# Patient Record
Sex: Male | Born: 1955
Health system: Southern US, Community
[De-identification: ages and names within clinical notes are randomized; demographics above are authoritative.]

## PROBLEM LIST (undated history)

## (undated) DIAGNOSIS — K644 Residual hemorrhoidal skin tags: Secondary | ICD-10-CM

## (undated) DIAGNOSIS — M1990 Unspecified osteoarthritis, unspecified site: Secondary | ICD-10-CM

## (undated) DIAGNOSIS — R5381 Other malaise: Secondary | ICD-10-CM

## (undated) DIAGNOSIS — G4700 Insomnia, unspecified: Secondary | ICD-10-CM

## (undated) DIAGNOSIS — R0989 Other specified symptoms and signs involving the circulatory and respiratory systems: Secondary | ICD-10-CM

## (undated) DIAGNOSIS — R7989 Other specified abnormal findings of blood chemistry: Secondary | ICD-10-CM

## (undated) DIAGNOSIS — R0600 Dyspnea, unspecified: Secondary | ICD-10-CM

## (undated) DIAGNOSIS — J45909 Unspecified asthma, uncomplicated: Secondary | ICD-10-CM

## (undated) DIAGNOSIS — I714 Abdominal aortic aneurysm, without rupture, unspecified: Secondary | ICD-10-CM

## (undated) DIAGNOSIS — J4489 Other specified chronic obstructive pulmonary disease: Secondary | ICD-10-CM

## (undated) DIAGNOSIS — K219 Gastro-esophageal reflux disease without esophagitis: Secondary | ICD-10-CM

## (undated) DIAGNOSIS — N419 Inflammatory disease of prostate, unspecified: Secondary | ICD-10-CM

## (undated) DIAGNOSIS — Z720 Tobacco use: Secondary | ICD-10-CM

## (undated) DIAGNOSIS — R0609 Other forms of dyspnea: Secondary | ICD-10-CM

## (undated) DIAGNOSIS — R21 Rash and other nonspecific skin eruption: Secondary | ICD-10-CM

## (undated) DIAGNOSIS — Z8719 Personal history of other diseases of the digestive system: Secondary | ICD-10-CM

## (undated) DIAGNOSIS — R5383 Other fatigue: Secondary | ICD-10-CM

## (undated) DIAGNOSIS — J449 Chronic obstructive pulmonary disease, unspecified: Secondary | ICD-10-CM

## (undated) DIAGNOSIS — L538 Other specified erythematous conditions: Secondary | ICD-10-CM

## (undated) DIAGNOSIS — E298 Other testicular dysfunction: Secondary | ICD-10-CM

## (undated) DIAGNOSIS — I10 Essential (primary) hypertension: Secondary | ICD-10-CM

## (undated) DIAGNOSIS — C4490 Unspecified malignant neoplasm of skin, unspecified: Secondary | ICD-10-CM

## (undated) DIAGNOSIS — E785 Hyperlipidemia, unspecified: Secondary | ICD-10-CM

## (undated) DIAGNOSIS — K649 Unspecified hemorrhoids: Secondary | ICD-10-CM

## (undated) HISTORY — PX: TONSILLECTOMY: SUR1361

## (undated) HISTORY — DX: Rash and other nonspecific skin eruption: R21

## (undated) HISTORY — PX: WRIST SURGERY: SHX841

## (undated) HISTORY — DX: Tobacco use: Z72.0

## (undated) HISTORY — PX: APPENDECTOMY: SHX54

## (undated) HISTORY — PX: TYMPANOPLASTY: SHX33

## (undated) HISTORY — DX: Chronic obstructive pulmonary disease, unspecified: J44.9

## (undated) HISTORY — DX: Dyspnea, unspecified: R06.00

## (undated) HISTORY — DX: Other specified abnormal findings of blood chemistry: R79.89

## (undated) HISTORY — DX: Other malaise: R53.81

## (undated) HISTORY — DX: Other testicular dysfunction: E29.8

## (undated) HISTORY — DX: Insomnia, unspecified: G47.00

## (undated) HISTORY — DX: Residual hemorrhoidal skin tags: K64.4

## (undated) HISTORY — PX: SKIN CANCER EXCISION: SHX779

## (undated) HISTORY — DX: Essential (primary) hypertension: I10

## (undated) HISTORY — DX: Inflammatory disease of prostate, unspecified: N41.9

## (undated) HISTORY — DX: Other specified chronic obstructive pulmonary disease: J44.89

## (undated) HISTORY — DX: Other specified erythematous conditions: L53.8

## (undated) HISTORY — DX: Hyperlipidemia, unspecified: E78.5

## (undated) HISTORY — DX: Other fatigue: R53.83

## (undated) HISTORY — DX: Other specified symptoms and signs involving the circulatory and respiratory systems: R09.89

## (undated) HISTORY — PX: TONSILLECTOMY: SHX5217

## (undated) HISTORY — DX: Other forms of dyspnea: R06.09

## (undated) HISTORY — DX: Other forms of dyspnea: R09.89

---

## 2001-04-06 ENCOUNTER — Ambulatory Visit (HOSPITAL_COMMUNITY): Admission: RE | Admit: 2001-04-06 | Discharge: 2001-04-06 | Payer: Self-pay | Admitting: Pulmonary Disease

## 2003-02-02 ENCOUNTER — Ambulatory Visit (HOSPITAL_COMMUNITY): Admission: RE | Admit: 2003-02-02 | Discharge: 2003-02-02 | Payer: Self-pay | Admitting: Pulmonary Disease

## 2003-07-09 ENCOUNTER — Encounter: Payer: Self-pay | Admitting: Emergency Medicine

## 2003-07-09 ENCOUNTER — Emergency Department (HOSPITAL_COMMUNITY): Admission: EM | Admit: 2003-07-09 | Discharge: 2003-07-09 | Payer: Self-pay | Admitting: Emergency Medicine

## 2003-07-18 ENCOUNTER — Ambulatory Visit (HOSPITAL_COMMUNITY): Admission: RE | Admit: 2003-07-18 | Discharge: 2003-07-18 | Payer: Self-pay | Admitting: Pulmonary Disease

## 2005-01-13 ENCOUNTER — Ambulatory Visit (HOSPITAL_COMMUNITY): Admission: RE | Admit: 2005-01-13 | Discharge: 2005-01-13 | Payer: Self-pay | Admitting: Pulmonary Disease

## 2005-01-13 IMAGING — CR DG CHEST 2V
2 series · 2 of 2 positions shown · non-contrast
Comparison: none

CLINICAL DATA: Short of breath, cough, and congestion. 
 TWO VIEW CHEST: 
 PA and lateral views of the chest are made and show diffuse peribronchial thickening but no evidence of acute infiltrate or consolidation.  The heart appears normal as does the mediastinum.  The bony thorax is normal.

[view not recorded (1 of 2)]
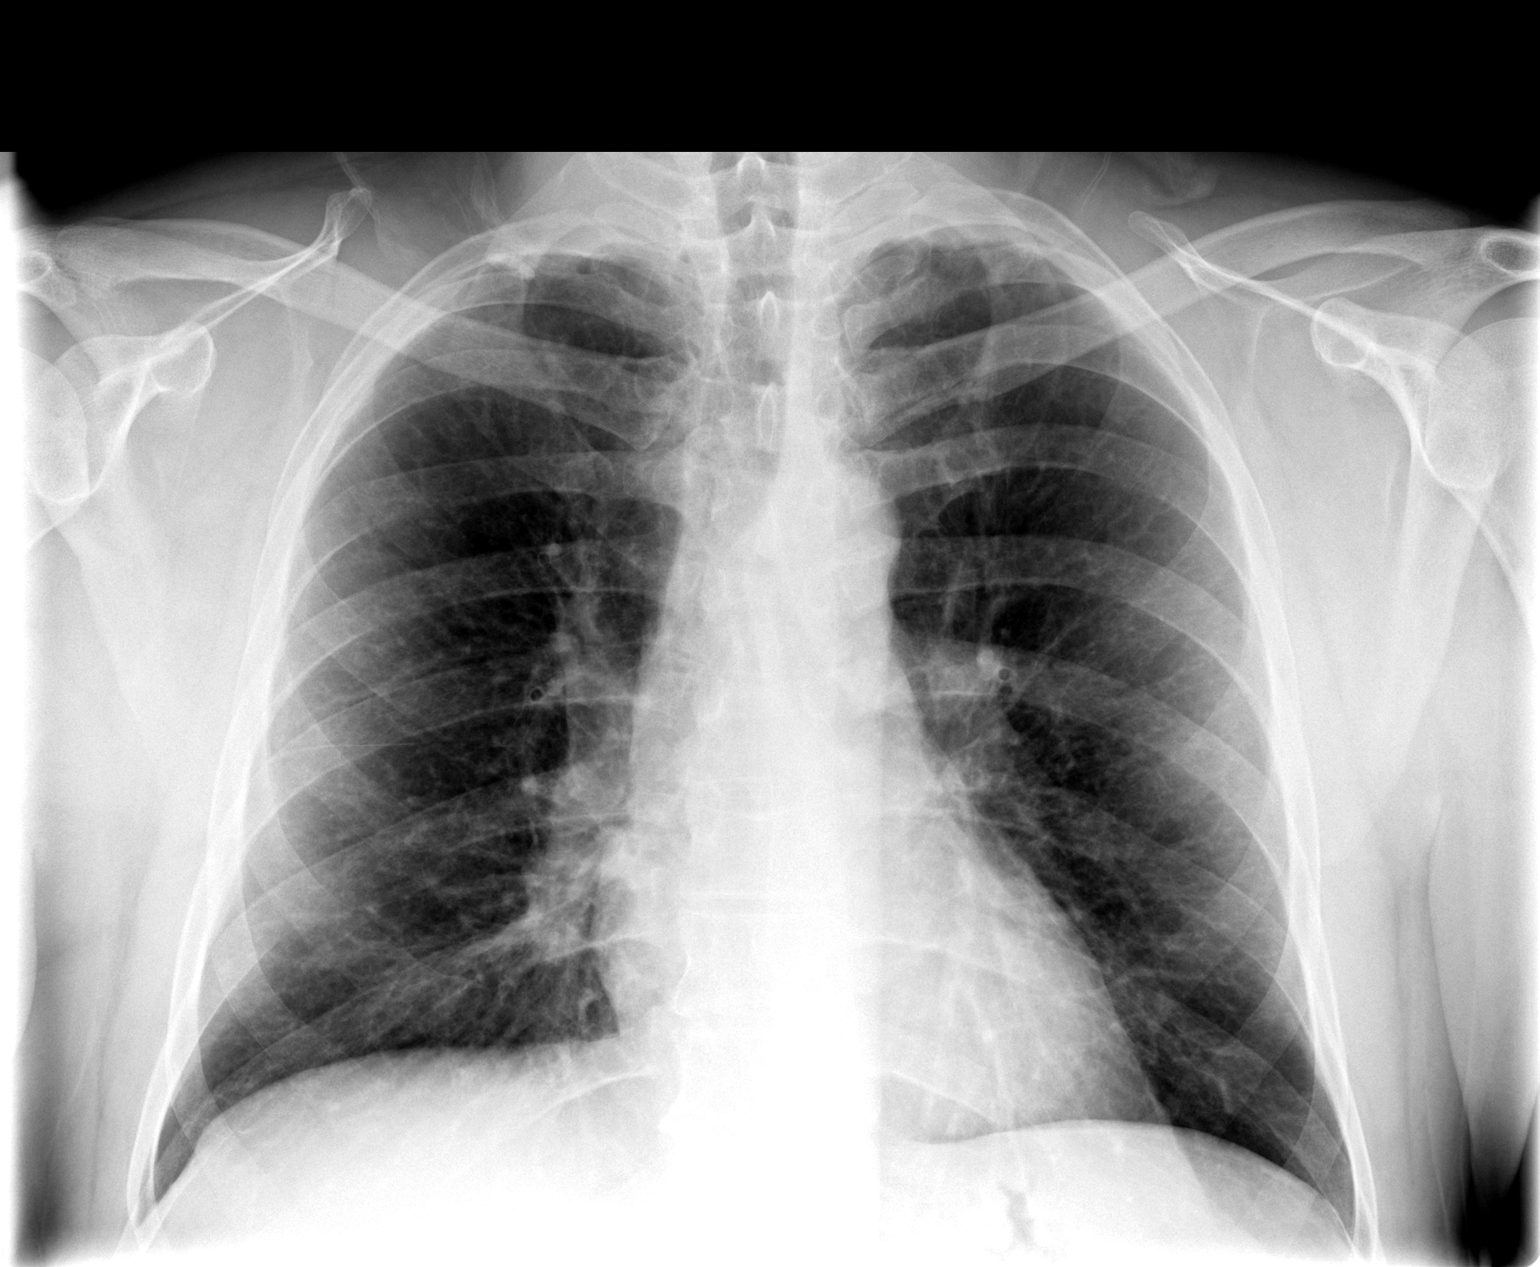

[view not recorded (2 of 2)]
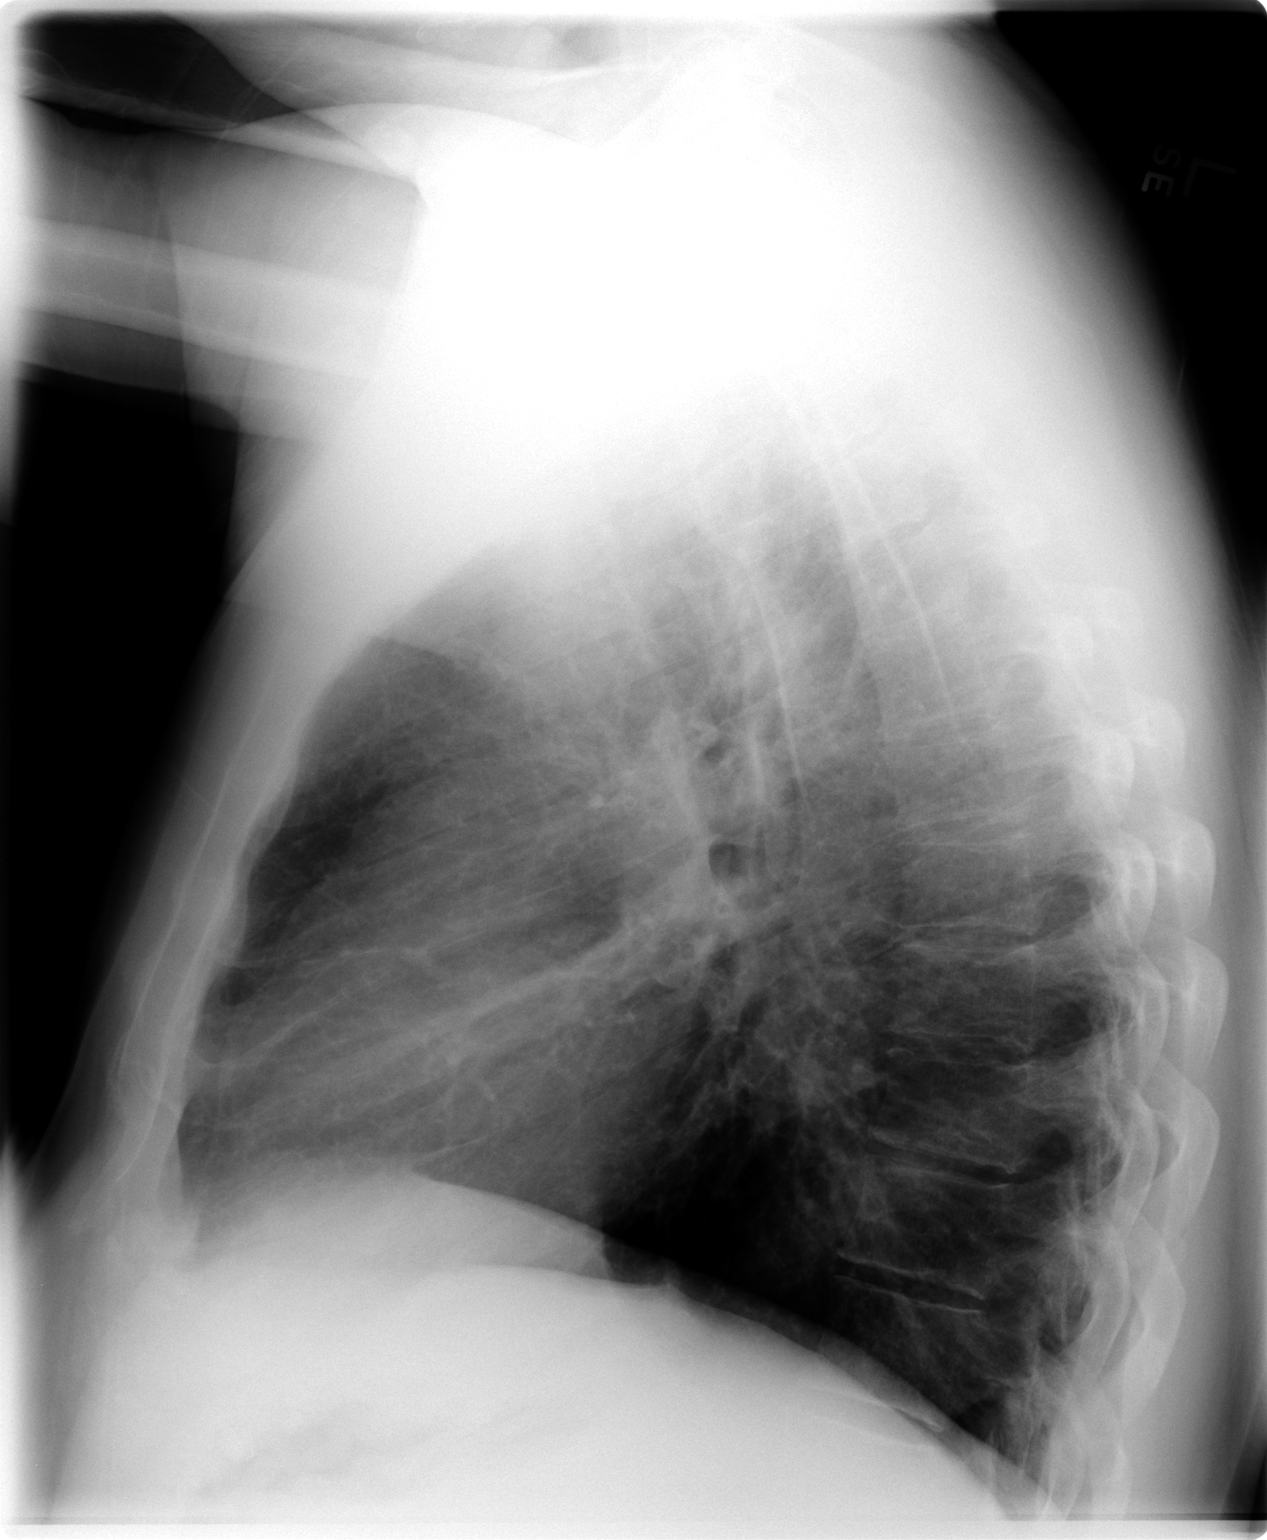

[2 of 2 positions shown; findings below may reference images not displayed]

IMPRESSION: Mild diffuse peribronchial thickening compatible with bronchitis.  No consolidation or edema is seen.

## 2005-09-25 ENCOUNTER — Ambulatory Visit (HOSPITAL_COMMUNITY): Admission: RE | Admit: 2005-09-25 | Discharge: 2005-09-25 | Payer: Self-pay | Admitting: Pulmonary Disease

## 2005-09-25 IMAGING — CR DG CHEST 2V
2 series · 2 of 2 positions shown · non-contrast
Comparison: none

CLINICAL DATA: Shortness of breath.  Bilateral leg numbness. Patient is a smoker.  
 CHEST - 2 VIEWS:

[view not recorded (1 of 2)]
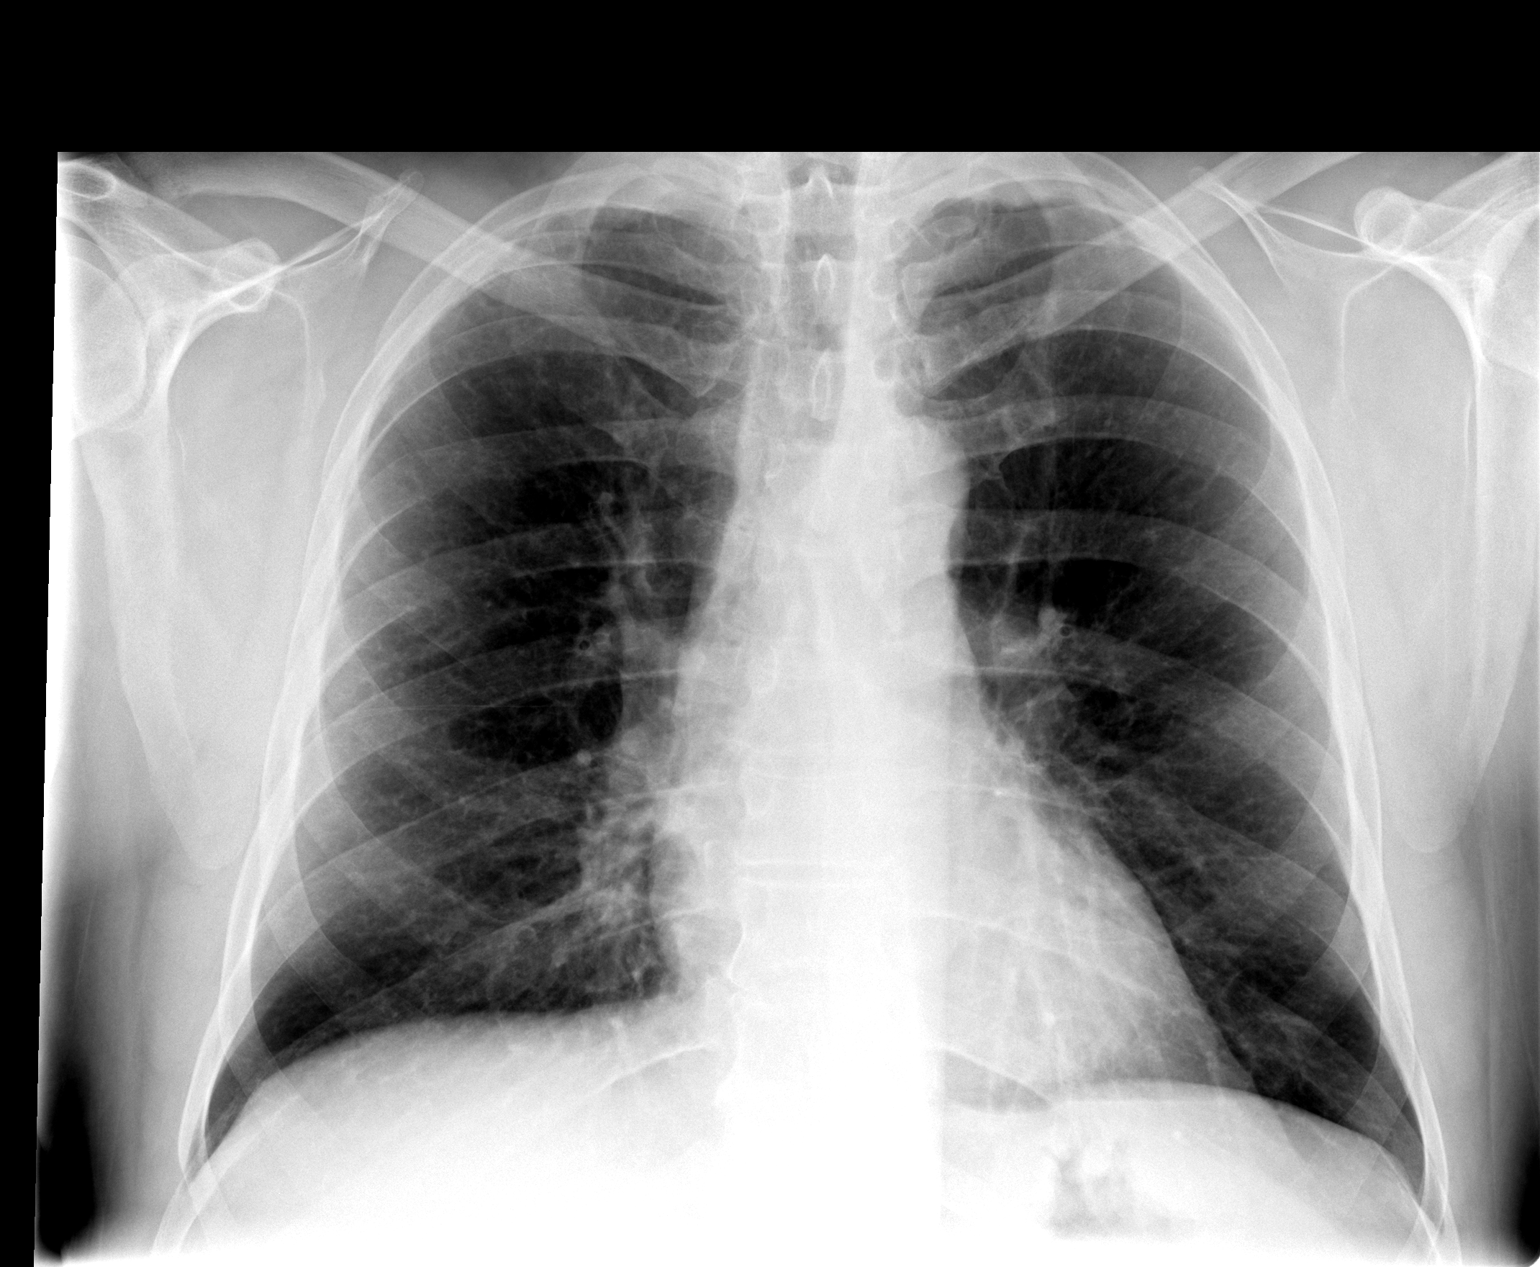

[view not recorded (2 of 2)]
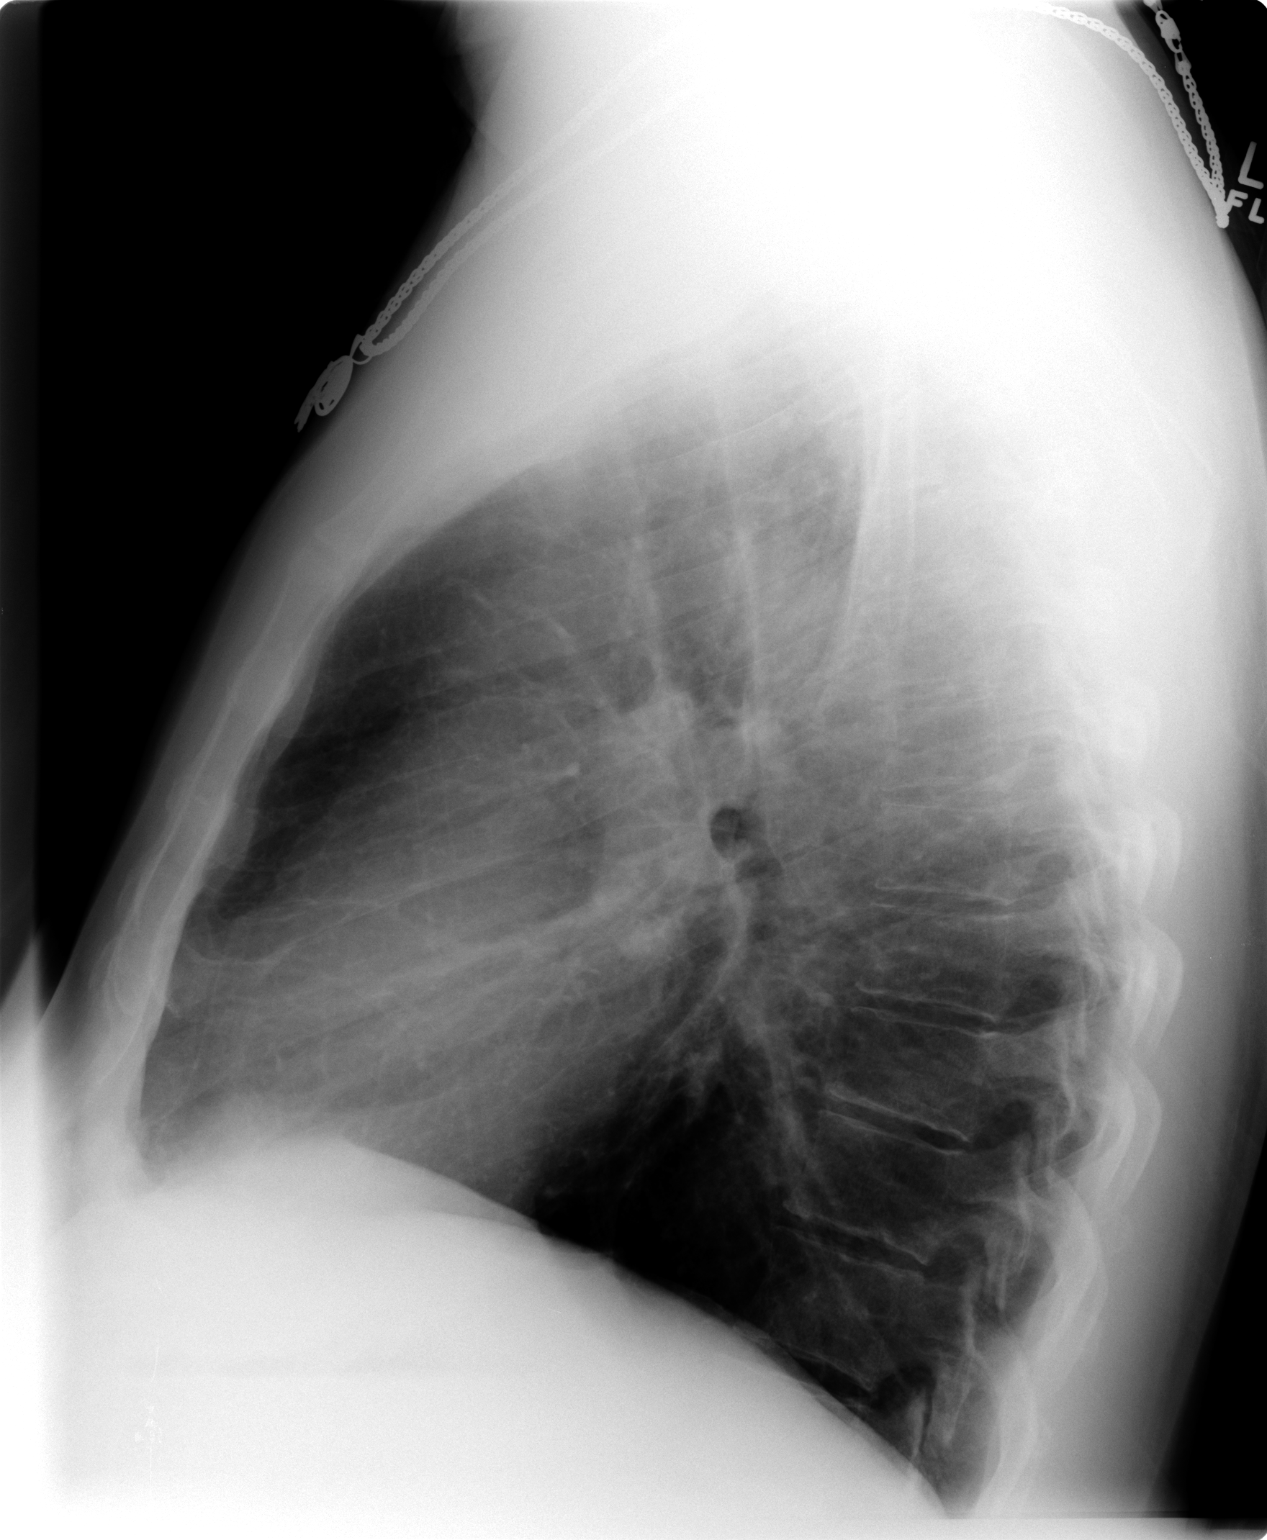

[2 of 2 positions shown; findings below may reference images not displayed]

FINDINGS: PA and lateral views of the chest are made and are compared to previous studies of [DATE] and show mild stable diffuse peribronchial thickening but no evidence of active infiltrate, consolidation, pleural effusion or edema.  The heart is normal.  The bony thorax appears normal.
IMPRESSION: Mild stable diffuse peribronchial thickening.  No evidence of active cardiopulmonary disease.

## 2005-09-25 IMAGING — CR DG LUMBAR SPINE COMPLETE 4+V
5 series · 5 of 5 positions shown · non-contrast
Comparison: none

CLINICAL DATA: Bilateral leg numbness.  Left side back pain.  
 LUMBAR SACRAL SPINE ? 5 VIEWS:

[view not recorded (1 of 5)]
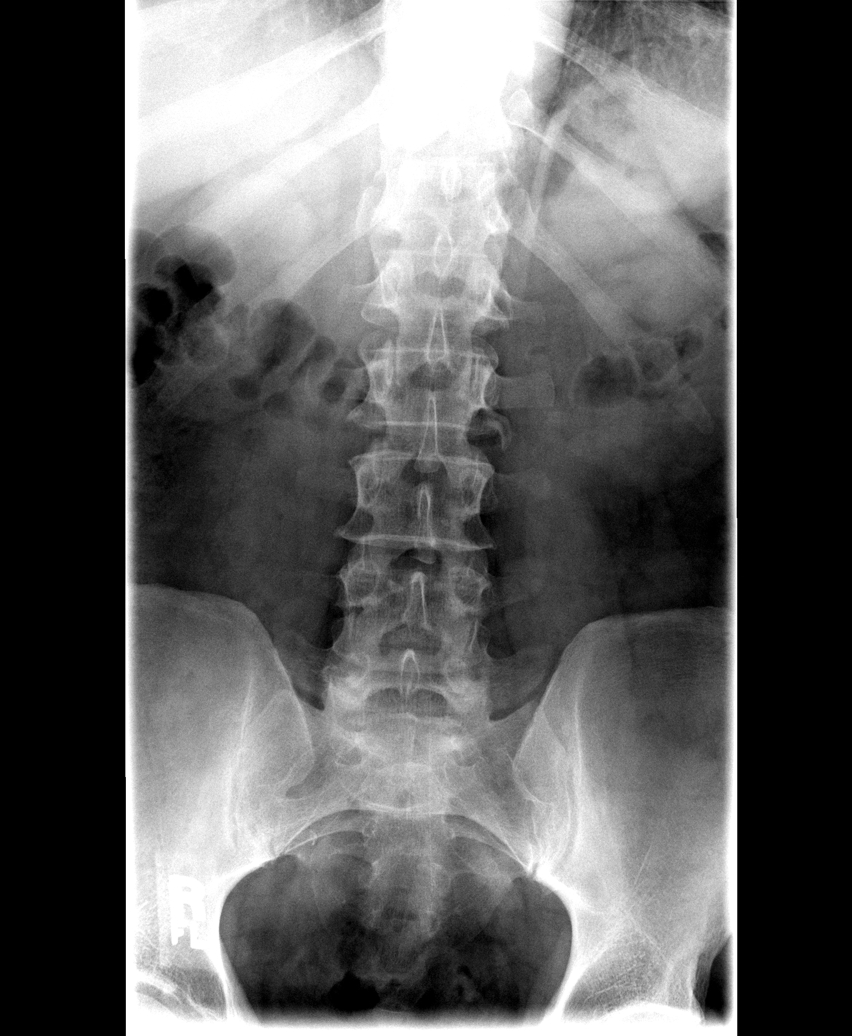

[view not recorded (2 of 5)]
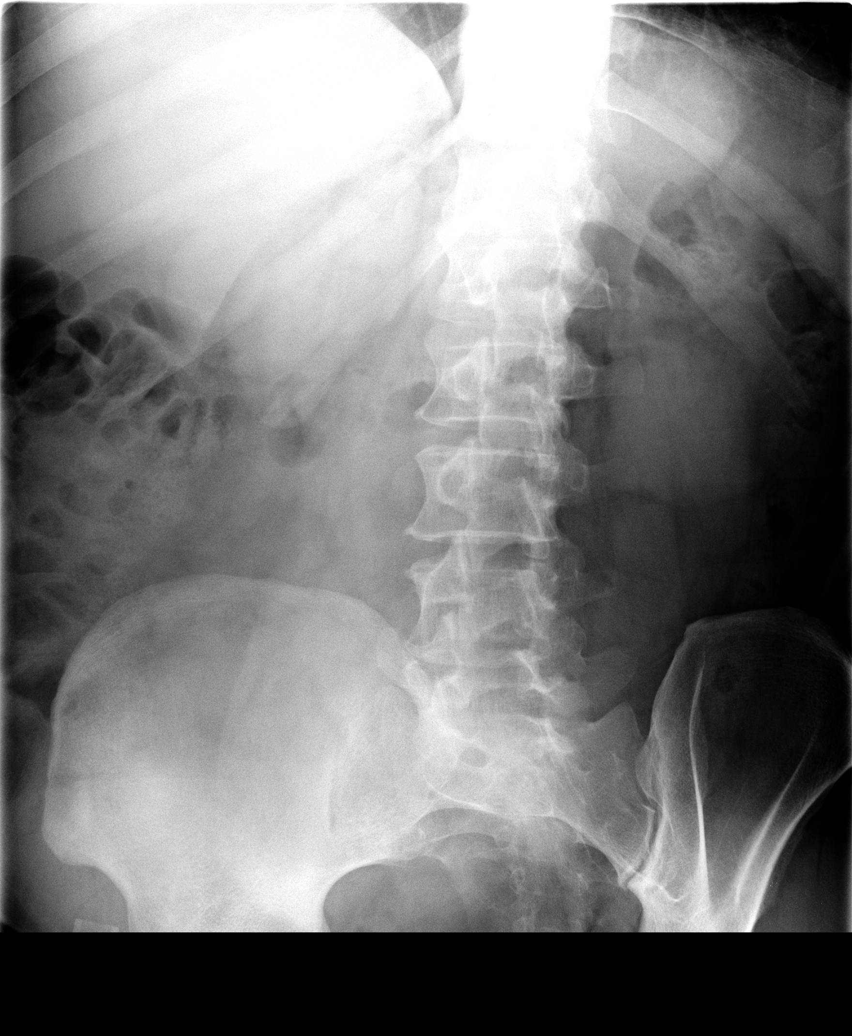

[view not recorded (3 of 5)]
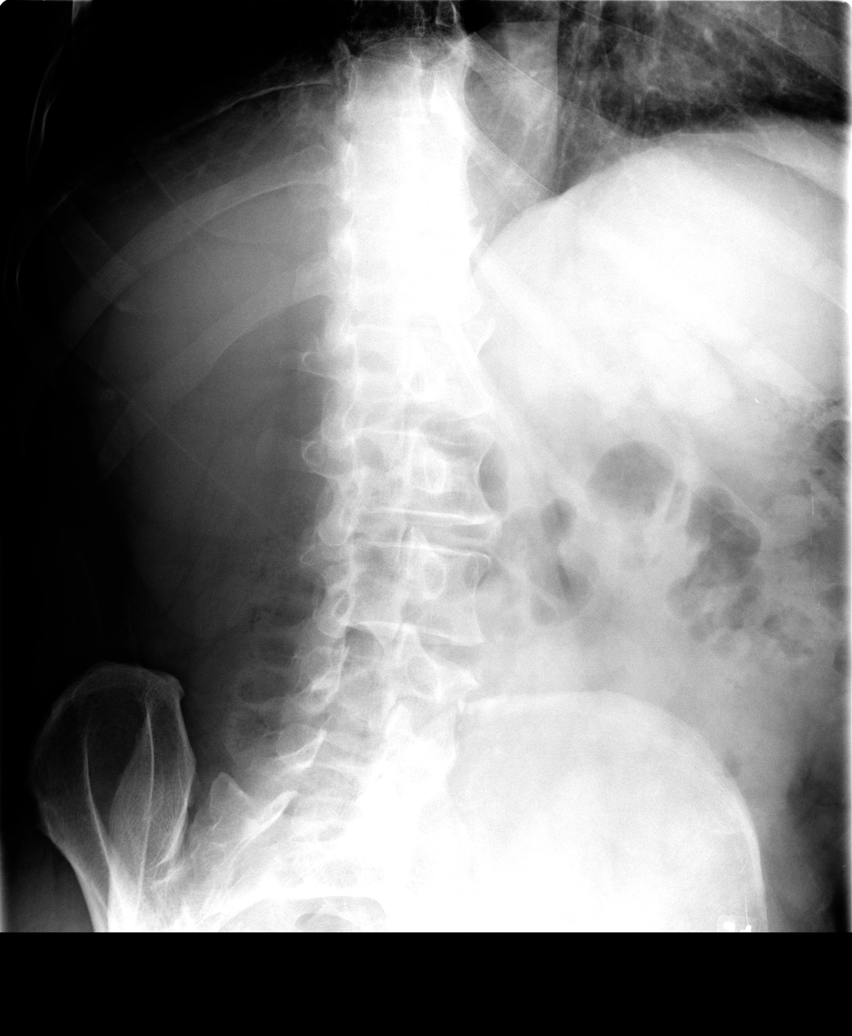

[view not recorded (4 of 5)]
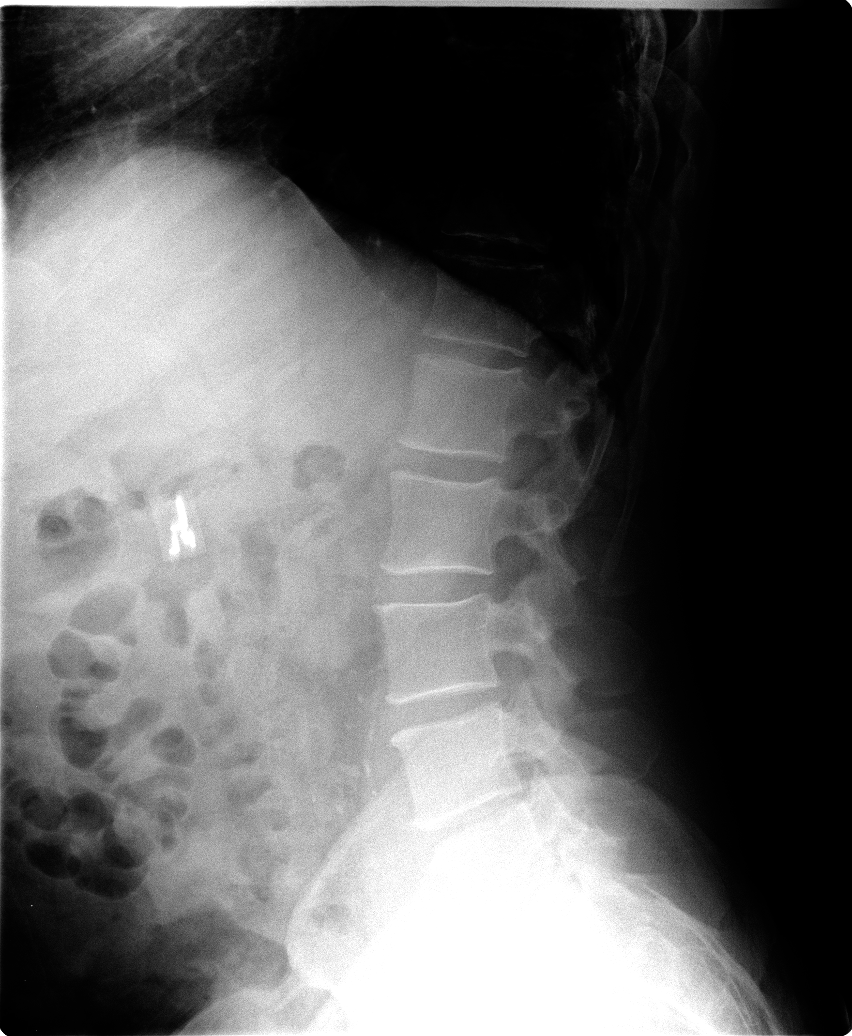

[view not recorded (5 of 5)]
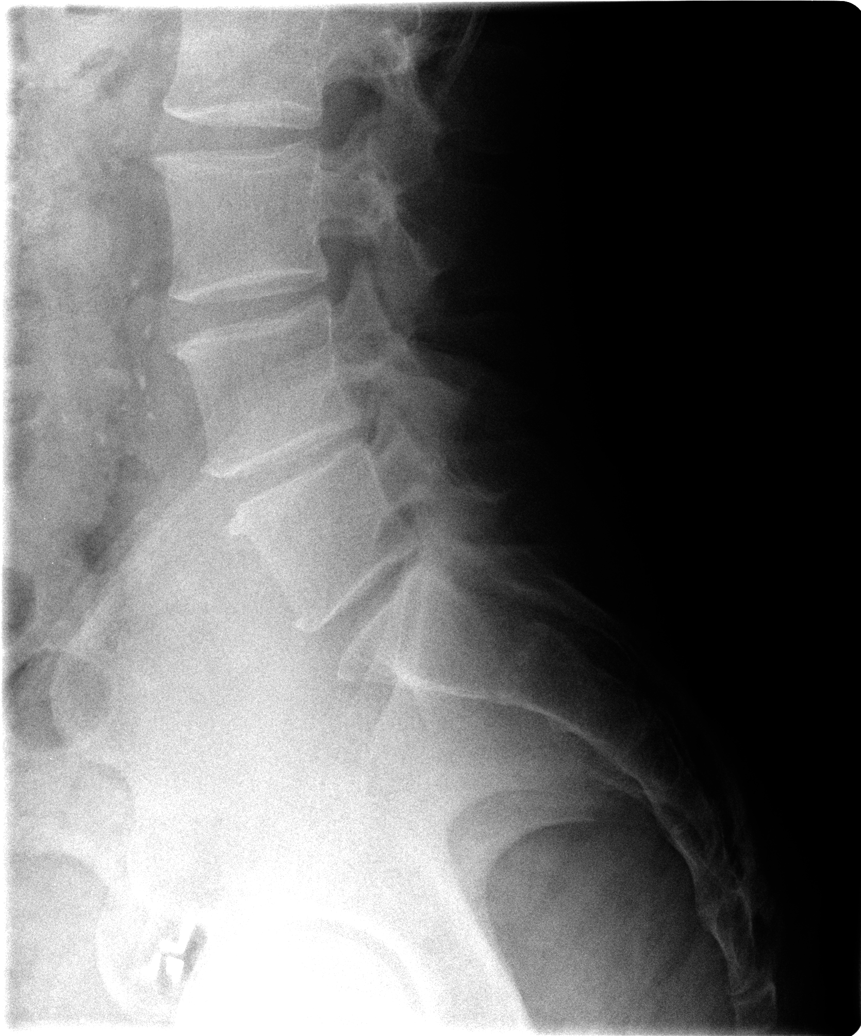

[5 of 5 positions shown; findings below may reference images not displayed]

FINDINGS: Five views of the lumbar sacral spine are made without previous films for comparison and show degenerative hypertrophic spurs which are most prominent at the L3-4/L2-3 level but also at L4-5 and L5-S1 where there is some joint space narrowing about both L4 and minimally at L5 posteriorly.  The posterior elements are intact.  There is no evidence of fracture or metastatic disease.  The sacroiliac joints appear normal.  The aorta shows moderate calcification but no evidence of aneurysm.
IMPRESSION: Degenerative hypertrophic spurs L1-2, L3-4, L4-5 and L5-S1 with some narrowing of the L4-5 space and minimal narrowing of the L5-S1 space.  No fracture or metastatic disease is seen.

## 2005-10-09 ENCOUNTER — Ambulatory Visit: Payer: Self-pay | Admitting: *Deleted

## 2005-10-15 ENCOUNTER — Encounter (HOSPITAL_COMMUNITY): Admission: RE | Admit: 2005-10-15 | Discharge: 2005-11-14 | Payer: Self-pay | Admitting: *Deleted

## 2005-10-15 ENCOUNTER — Ambulatory Visit: Payer: Self-pay | Admitting: Cardiology

## 2005-10-15 IMAGING — NM NM MYOCAR MULTI W/ SPECT
2 series · 12 of 12 positions shown · non-contrast
Comparison: none

CLINICAL DATA: 49-year-old gentleman with multiple cardiovascular risk factors presenting with fatigue and EKG abnormalities.  
STRESS MYOVIEW STUDY:
RADIONUCLIDE DATA:  One day rest/stress protocol performed with 10.0/30.0 mCi [IL] Myoview. 
STRESS DATA:  Treadmill exercise to a workload of 13 mets and a heart rate of 157, 91% of age - predicted maximum.  Exercise discontinued due to dyspnea, no chest pain reported.  Blood pressure increased from a resting value of 120/80 [REDACTED] during exercise and 160/90 early in recovery, a normal response.  No important arrhythmias - occasional PVCs. 
EKG:  Normal sinus rhythm with PACs; right axis deviation; nondiagnostic inferior Q-wave; slightly delayed R-wave progression.  
STRESS EKG:  Insignificant upsloping ST segment depression.  
SCINTIGRAPHIC DATA:  Acquisition notable for minor repetitive movement, particularly during the stress portion of the examination.  Left ventricular size was at the upper limit of normal.  On tomographic images reconstructed in standard planes, there was a small area in the distal inferoseptal region, extending to the apex, in which a minor decrease in tracer uptake was noted.  By comparison to the resting portion of the study, no reversibility was apparent.  The gated reconstruction demonstrated normal regional and global LV systolic function with an estimated ejection fraction of .61.  There was normal systolic accentuation of activity throughout except for a very small segment at the base of the inferior wall.

[Series 1: cs cardiac tc hi dose · 6.52mm/px · 6 of 512 frames shown]
[frame 43/512]
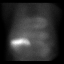
[frame 128/512]
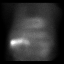
[frame 214/512]
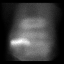
[frame 299/512]
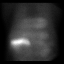
[frame 384/512]
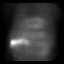
[frame 470/512]
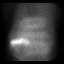

[Series 1: cr cardiac tc low dose · 6.52mm/px · 6 of 64 frames shown]
[frame 6/64]
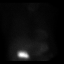
[frame 16/64]
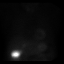
[frame 27/64]
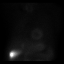
[frame 38/64]
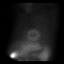
[frame 48/64]
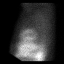
[frame 59/64]
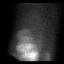

[12 of 12 positions shown; findings below may reference images not displayed]

IMPRESSION: Probably negative stress Myoview study revealing good exercise tolerance, no angina, borderline left ventricular size, and normal overall left systolic function.  By scintigraphic imaging, there was a small inferoseptal and apical defect, which could represent diaphragmatic attenuation plus physiologic apical thinning.  No evidence for myocardial ischemia.  A small degree of scarring in the inferoseptal and apical segments cannot be unequivocally excluded. Other findings as noted. This constellation of findings constitutes a low risk examination.

## 2005-10-20 ENCOUNTER — Ambulatory Visit: Payer: Self-pay | Admitting: *Deleted

## 2005-12-18 ENCOUNTER — Ambulatory Visit (HOSPITAL_COMMUNITY): Admission: RE | Admit: 2005-12-18 | Discharge: 2005-12-18 | Payer: Self-pay | Admitting: Pulmonary Disease

## 2006-08-16 ENCOUNTER — Emergency Department (HOSPITAL_COMMUNITY): Admission: EM | Admit: 2006-08-16 | Discharge: 2006-08-16 | Payer: Self-pay | Admitting: Emergency Medicine

## 2006-08-16 IMAGING — CR DG CERVICAL SPINE COMPLETE 4+V
7 series · 7 of 7 positions shown · non-contrast
Comparison: none

CLINICAL DATA: Neck pain.  Possible injury.
 CERVICAL SPINE - 7 VIEW:

[view not recorded (1 of 7)]
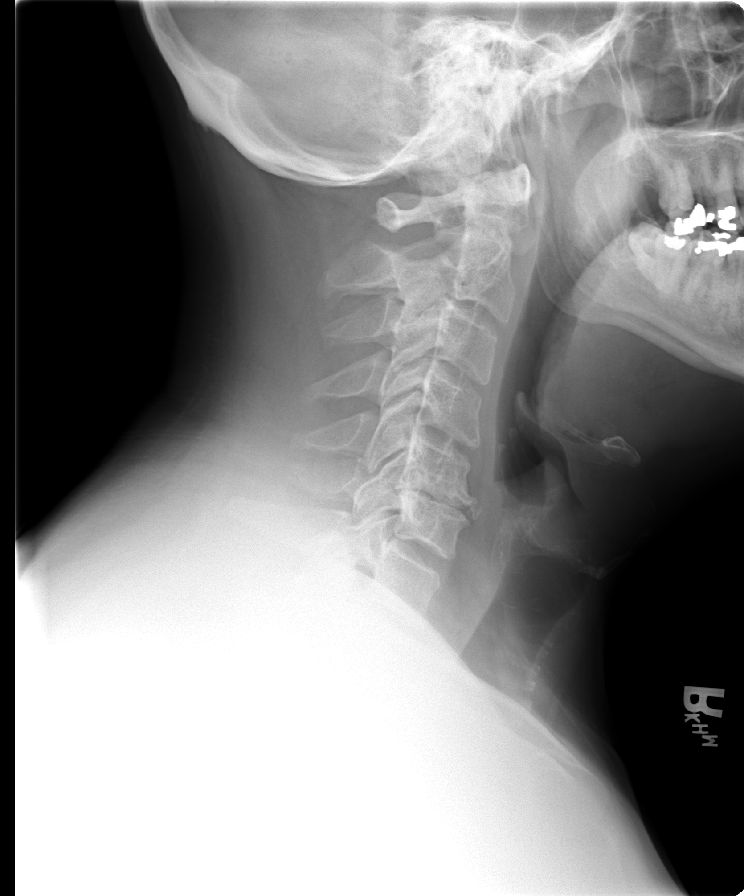

[view not recorded (2 of 7)]
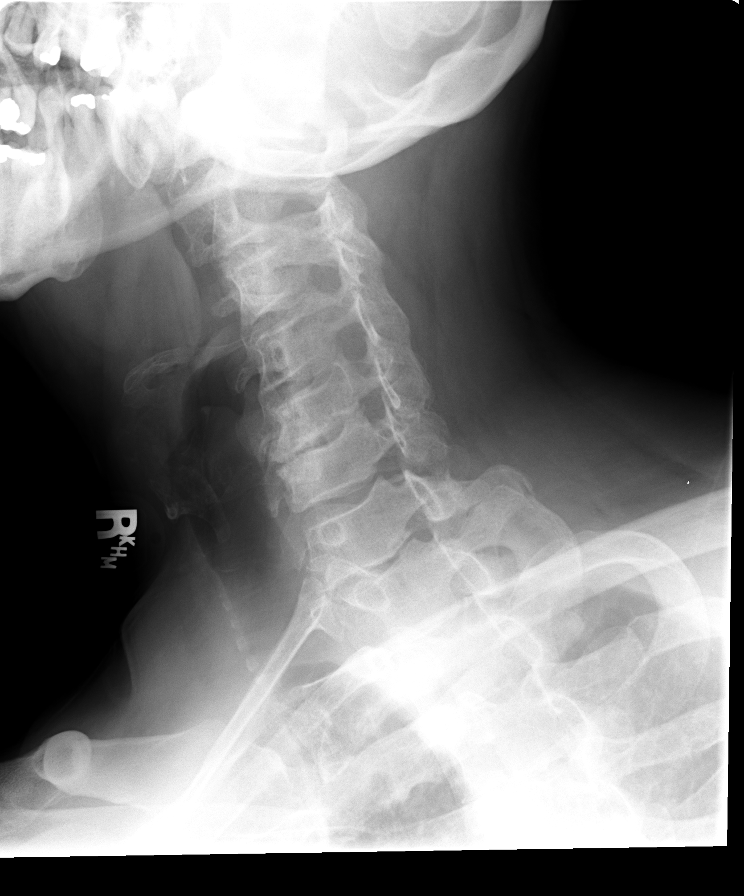

[view not recorded (3 of 7)]
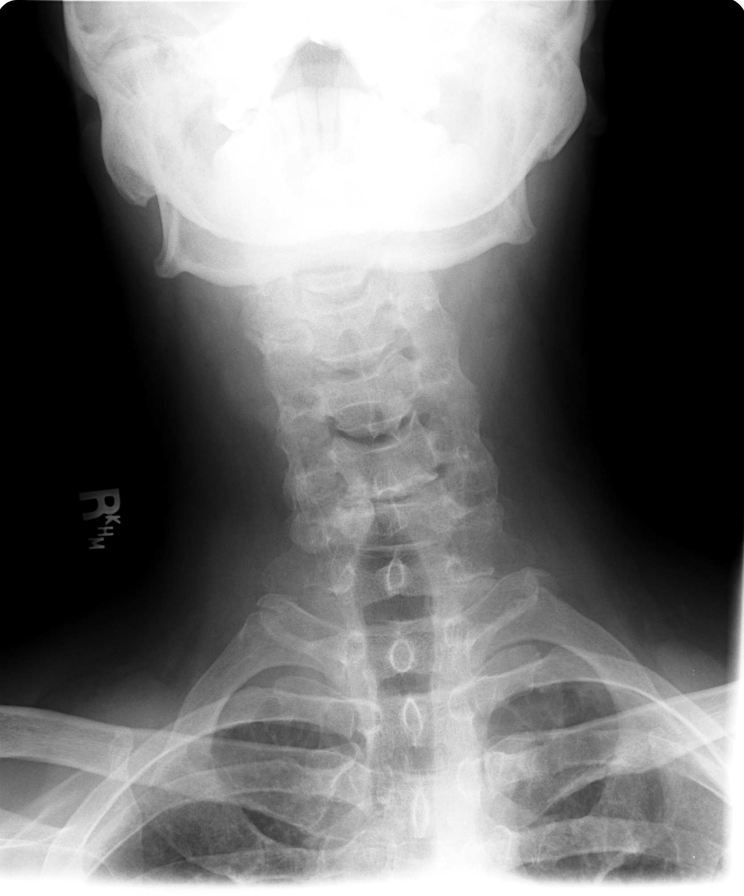

[view not recorded (4 of 7)]
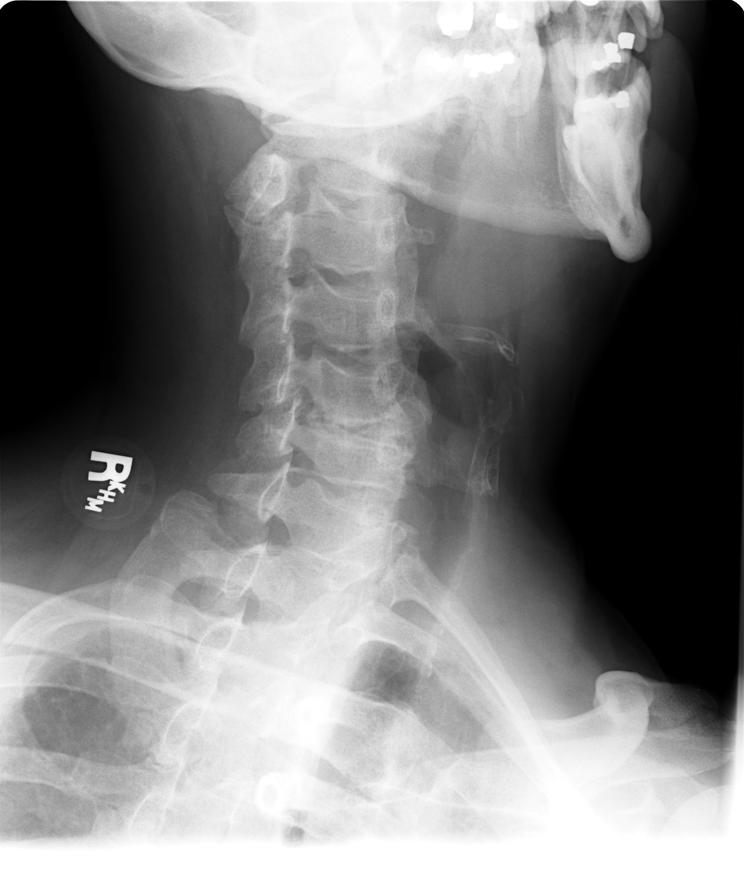

[view not recorded (5 of 7)]
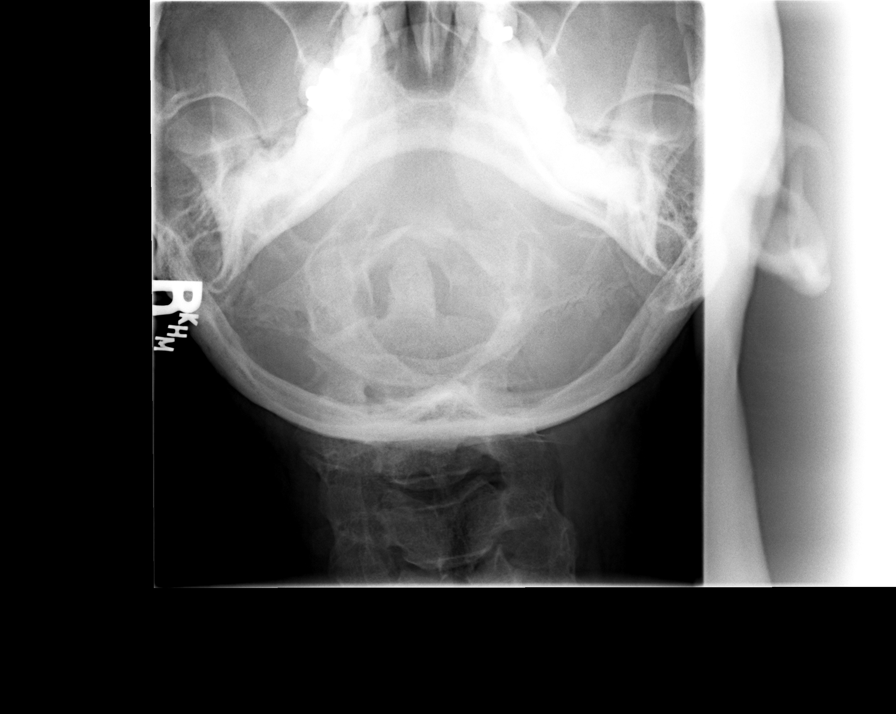

[view not recorded (6 of 7)]
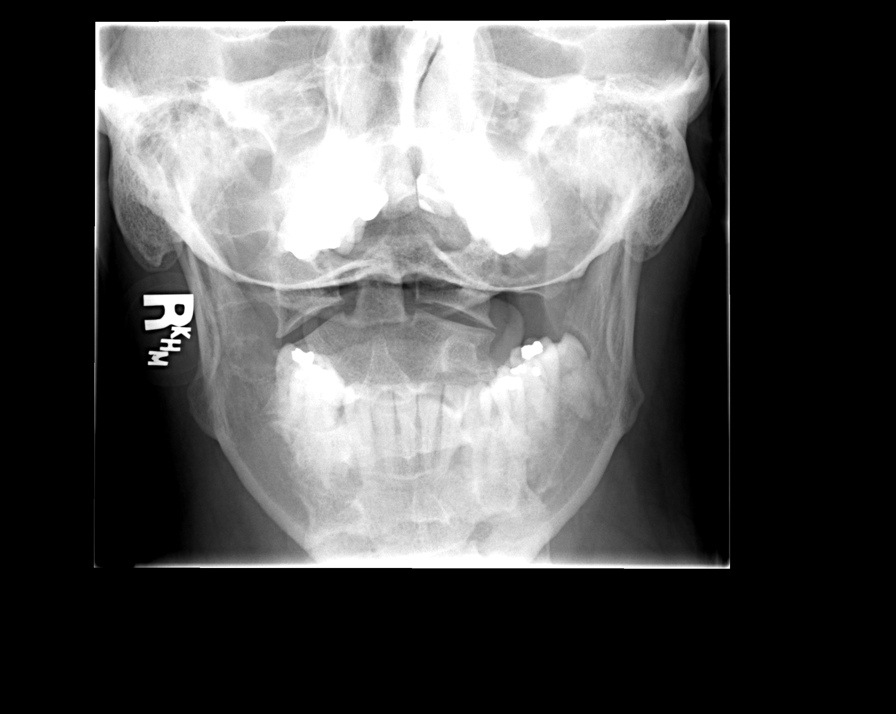

[view not recorded (7 of 7)]
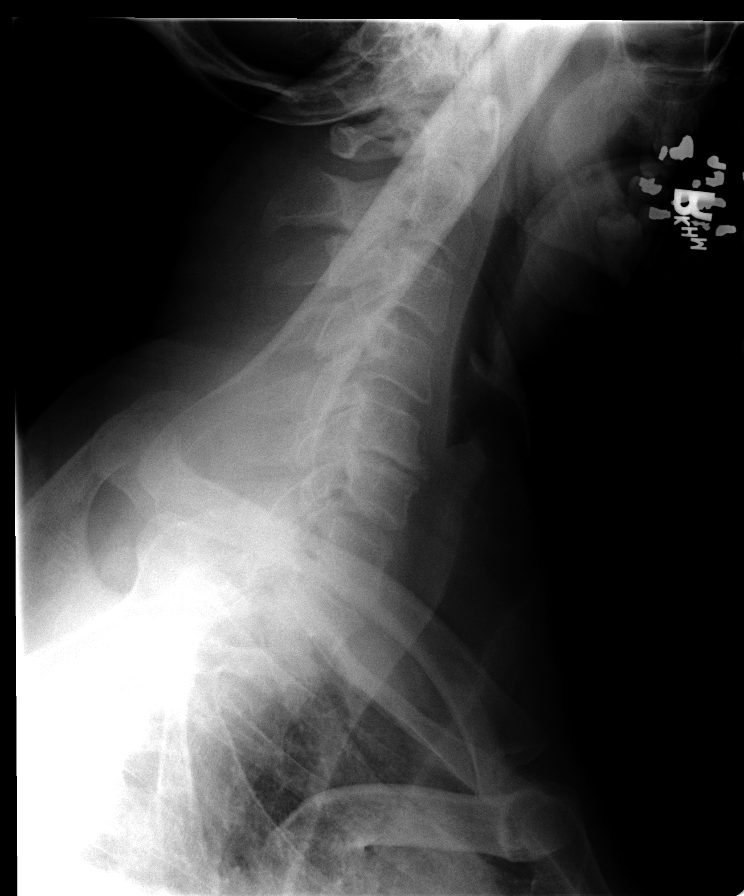

[7 of 7 positions shown; findings below may reference images not displayed]

FINDINGS: Straightening of cervical spine.  Advanced degenerative disc disease changes at C5-6 with marked disc space narrowing and osteophytic formation.  Disc space narrowing is mainly present on the right along the concave aspect of the slightly scoliotic curve.  Osteophytic encroachment on the right C5-6 neural foramen.  Degenerative facet joint changes at multiple levels with facet hypertrophy mainly at C2-3.  Very minimal retrolisthesis of C5 on C6 compatible with degenerative changes.   Loss of normal lordotic curvature.
IMPRESSION: No fracture or acute subluxation.  Advanced spondylotic changes C5-6.

## 2007-06-25 ENCOUNTER — Emergency Department (HOSPITAL_COMMUNITY): Admission: EM | Admit: 2007-06-25 | Discharge: 2007-06-25 | Payer: Self-pay | Admitting: Emergency Medicine

## 2008-11-22 ENCOUNTER — Emergency Department (HOSPITAL_COMMUNITY): Admission: EM | Admit: 2008-11-22 | Discharge: 2008-11-22 | Payer: Self-pay | Admitting: Emergency Medicine

## 2009-09-26 ENCOUNTER — Ambulatory Visit: Payer: Self-pay | Admitting: Gastroenterology

## 2009-10-10 ENCOUNTER — Ambulatory Visit: Payer: Self-pay | Admitting: Gastroenterology

## 2009-10-17 ENCOUNTER — Encounter: Payer: Self-pay | Admitting: Gastroenterology

## 2009-11-24 DIAGNOSIS — R21 Rash and other nonspecific skin eruption: Secondary | ICD-10-CM

## 2009-11-24 HISTORY — DX: Rash and other nonspecific skin eruption: R21

## 2010-01-24 ENCOUNTER — Emergency Department (HOSPITAL_COMMUNITY): Admission: EM | Admit: 2010-01-24 | Discharge: 2010-01-24 | Payer: Self-pay | Admitting: Emergency Medicine

## 2010-01-24 IMAGING — CT CT HEAD W/O CM
4 of 5 series · 15 of 47 positions shown, 16 images · non-contrast
Comparison: None.

CT HEAD

CLINICAL DATA: Fell and hit head.  Headache.

CT HEAD WITHOUT CONTRAST
CT CERVICAL SPINE WITHOUT CONTRAST
TECHNIQUE: Multidetector CT imaging of the head and cervical spine
was performed following the standard protocol without intravenous
contrast.  Multiplanar CT image reconstructions of the cervical
spine were also generated.

[Series 2: headseq 4.8 h37s · axial · 0.47mm/px · z∈[+357,+447]mm · 3 of 36 slices shown, 4 images]
[im 9/36  brain]
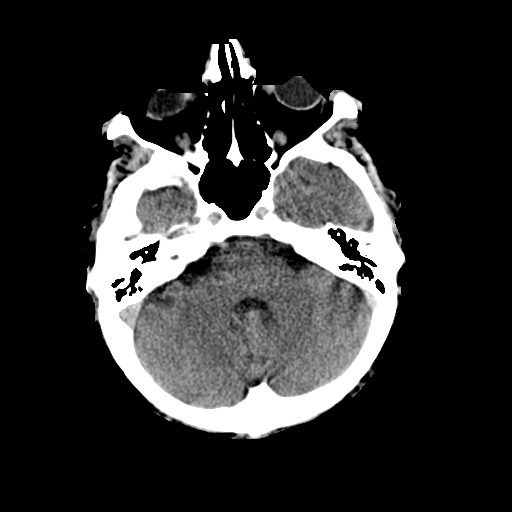
[im 9/36  bone]
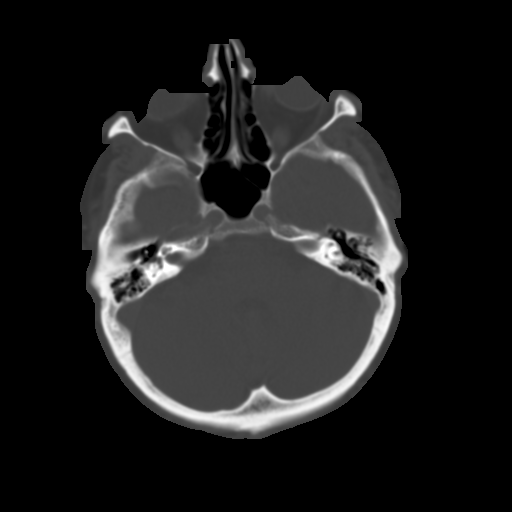
[im 18/36  brain]
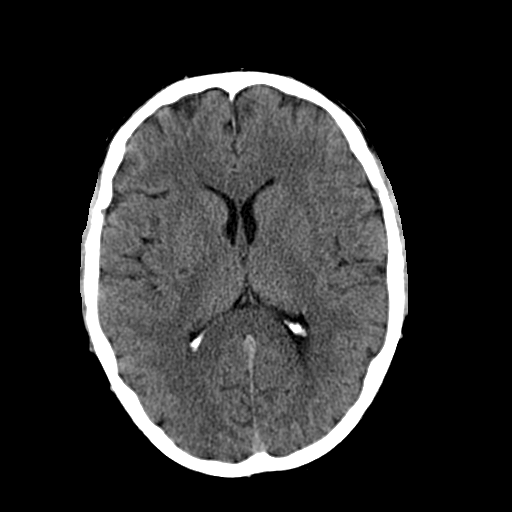
[im 27/36  brain]
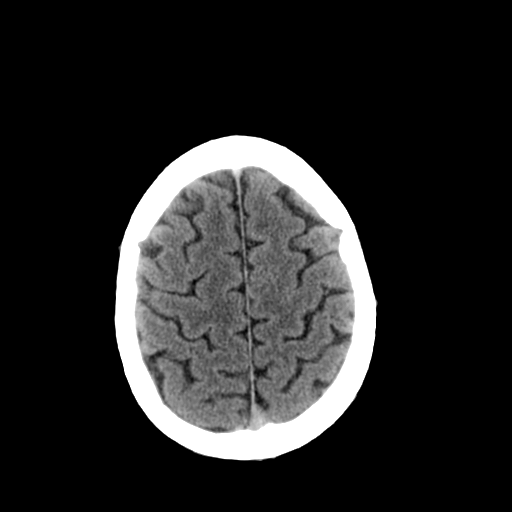

[Series 4: cervical st 2.0 b31s · axial · 0.27mm/px · z∈[+140,+250]mm · 6 of 94 slices shown]
[im 8/94  brain]
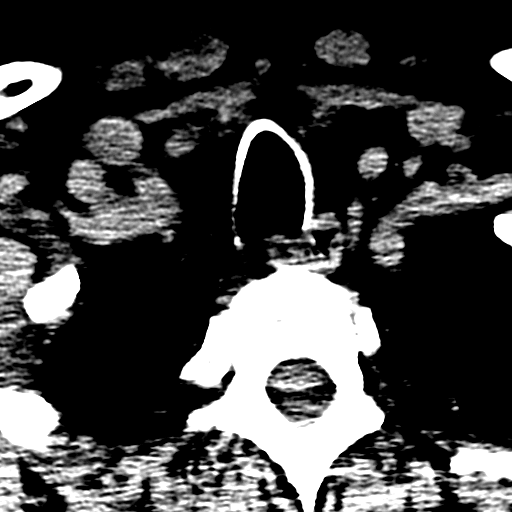
[im 24/94  brain]
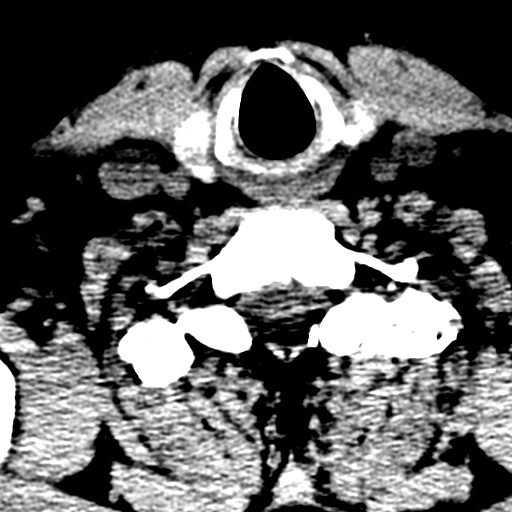
[im 32/94  brain]
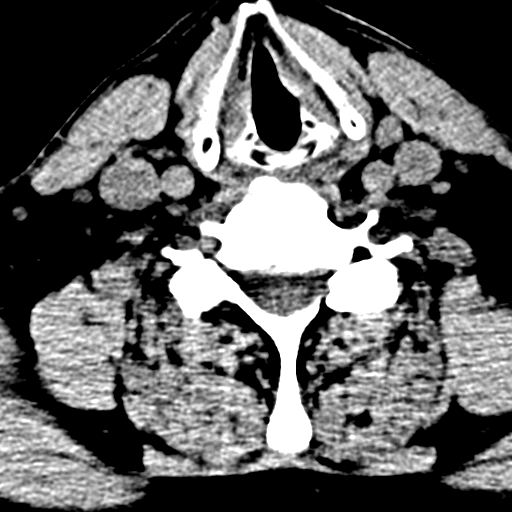
[im 39/94  brain]
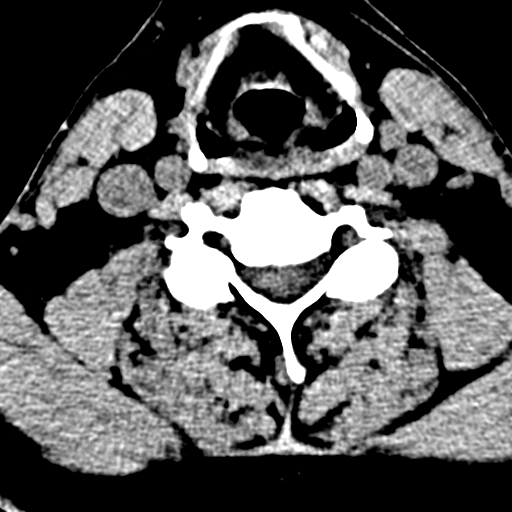
[im 55/94  brain]
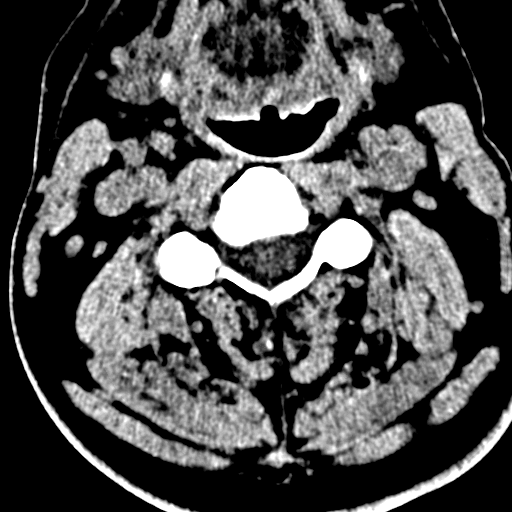
[im 63/94  brain]
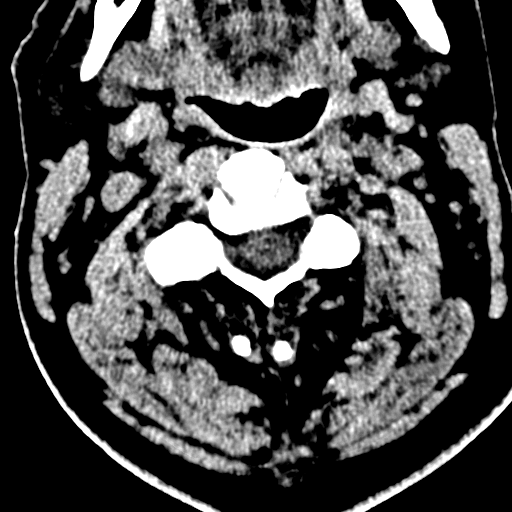

[Series 7: cervical coro (id) · coronal · 0.18mm/px · 3 of 42 slices shown]
[im 14/42  brain]
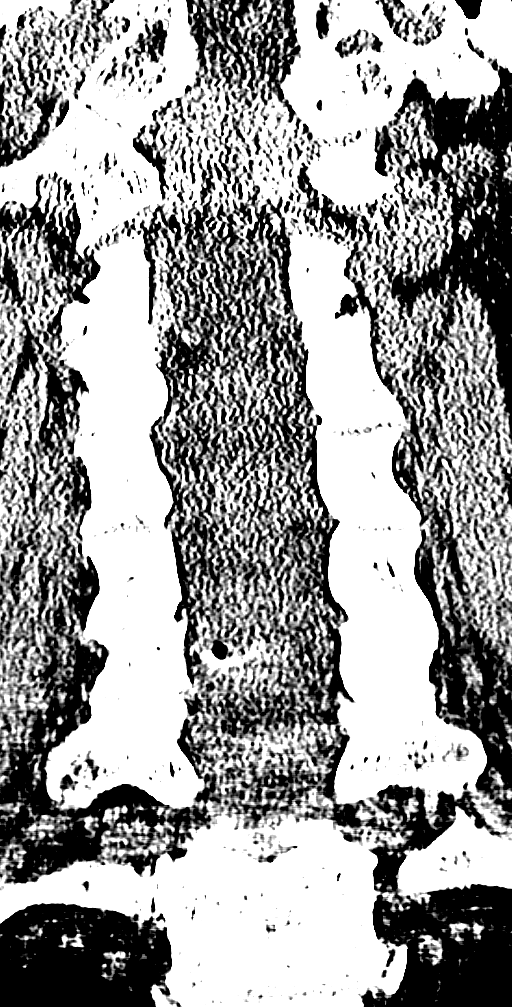
[im 19/42  brain]
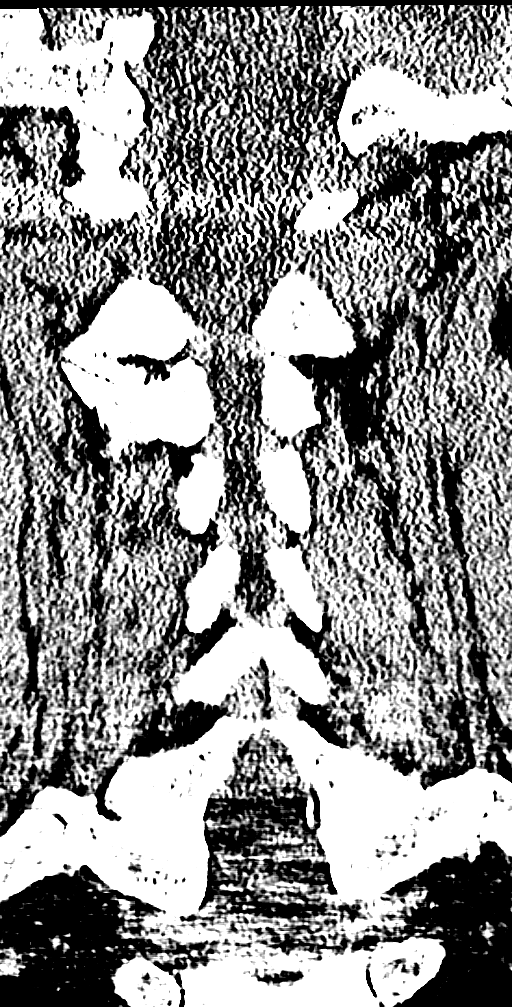
[im 23/42  brain]
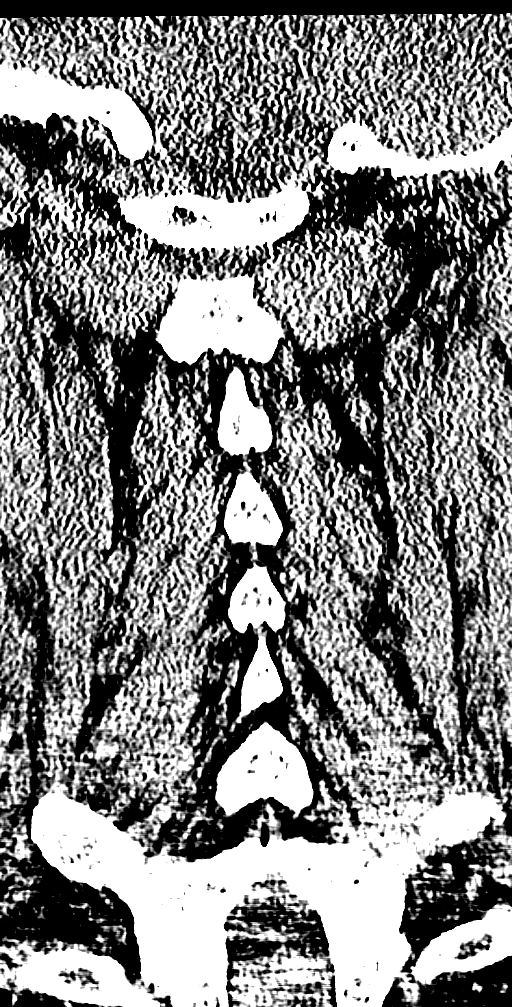

[Series 8: cervical sag (id) · sagittal · 0.24mm/px · 3 of 41 slices shown]
[im 14/41  brain]
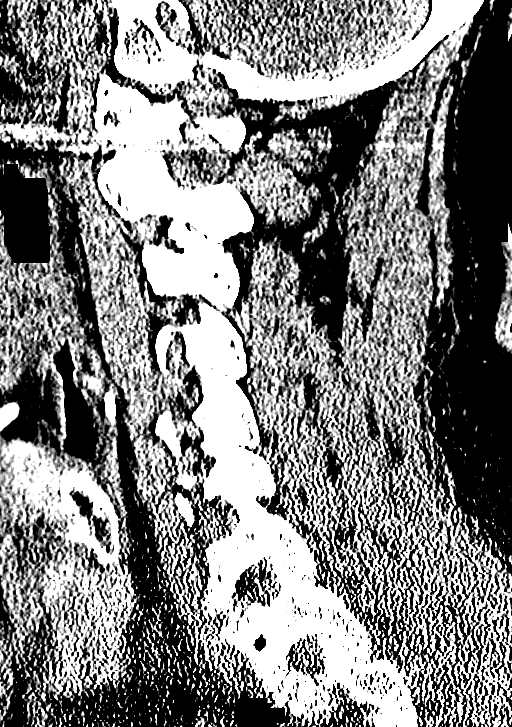
[im 21/41  brain]
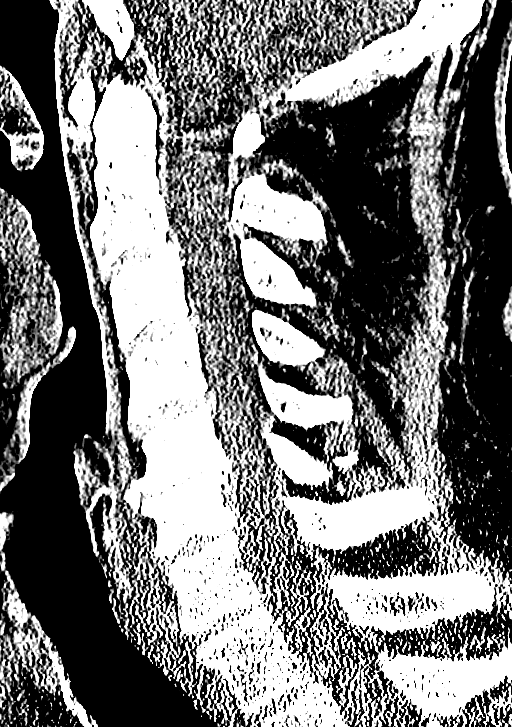
[im 27/41  brain]
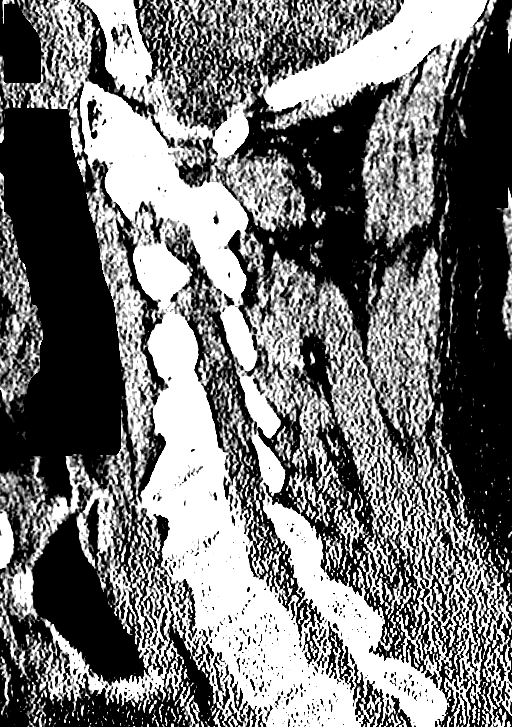

[15 of 47 positions shown; findings below may reference images not displayed]

FINDINGS: The  ventricle size is normal.  Negative for intracranial
hemorrhage.  There is no infarct or mass lesion.

The calvarium is intact.  There is mucosal thickening and fluid in
the right maxillary sinus.
IMPRESSION: No significant intracranial abnormality.

Sinusitis

CT CERVICAL SPINE
FINDINGS: Negative for fracture.

The alignment is normal.  There is advanced disc degeneration and
spondylosis at C5-6.  Mild disc degeneration at C4-5 and C6-7.  No
acute bony abnormality.
IMPRESSION: Negative for fracture.

## 2010-05-06 ENCOUNTER — Encounter: Admission: RE | Admit: 2010-05-06 | Discharge: 2010-05-06 | Payer: Self-pay | Admitting: Internal Medicine

## 2010-05-06 IMAGING — CR DG CHEST 2V
2 series · 2 of 2 positions shown · non-contrast
Comparison: [DATE] S6.

CLINICAL DATA: Cough and congestion.

CHEST - 2 VIEW

[view not recorded (1 of 2)]
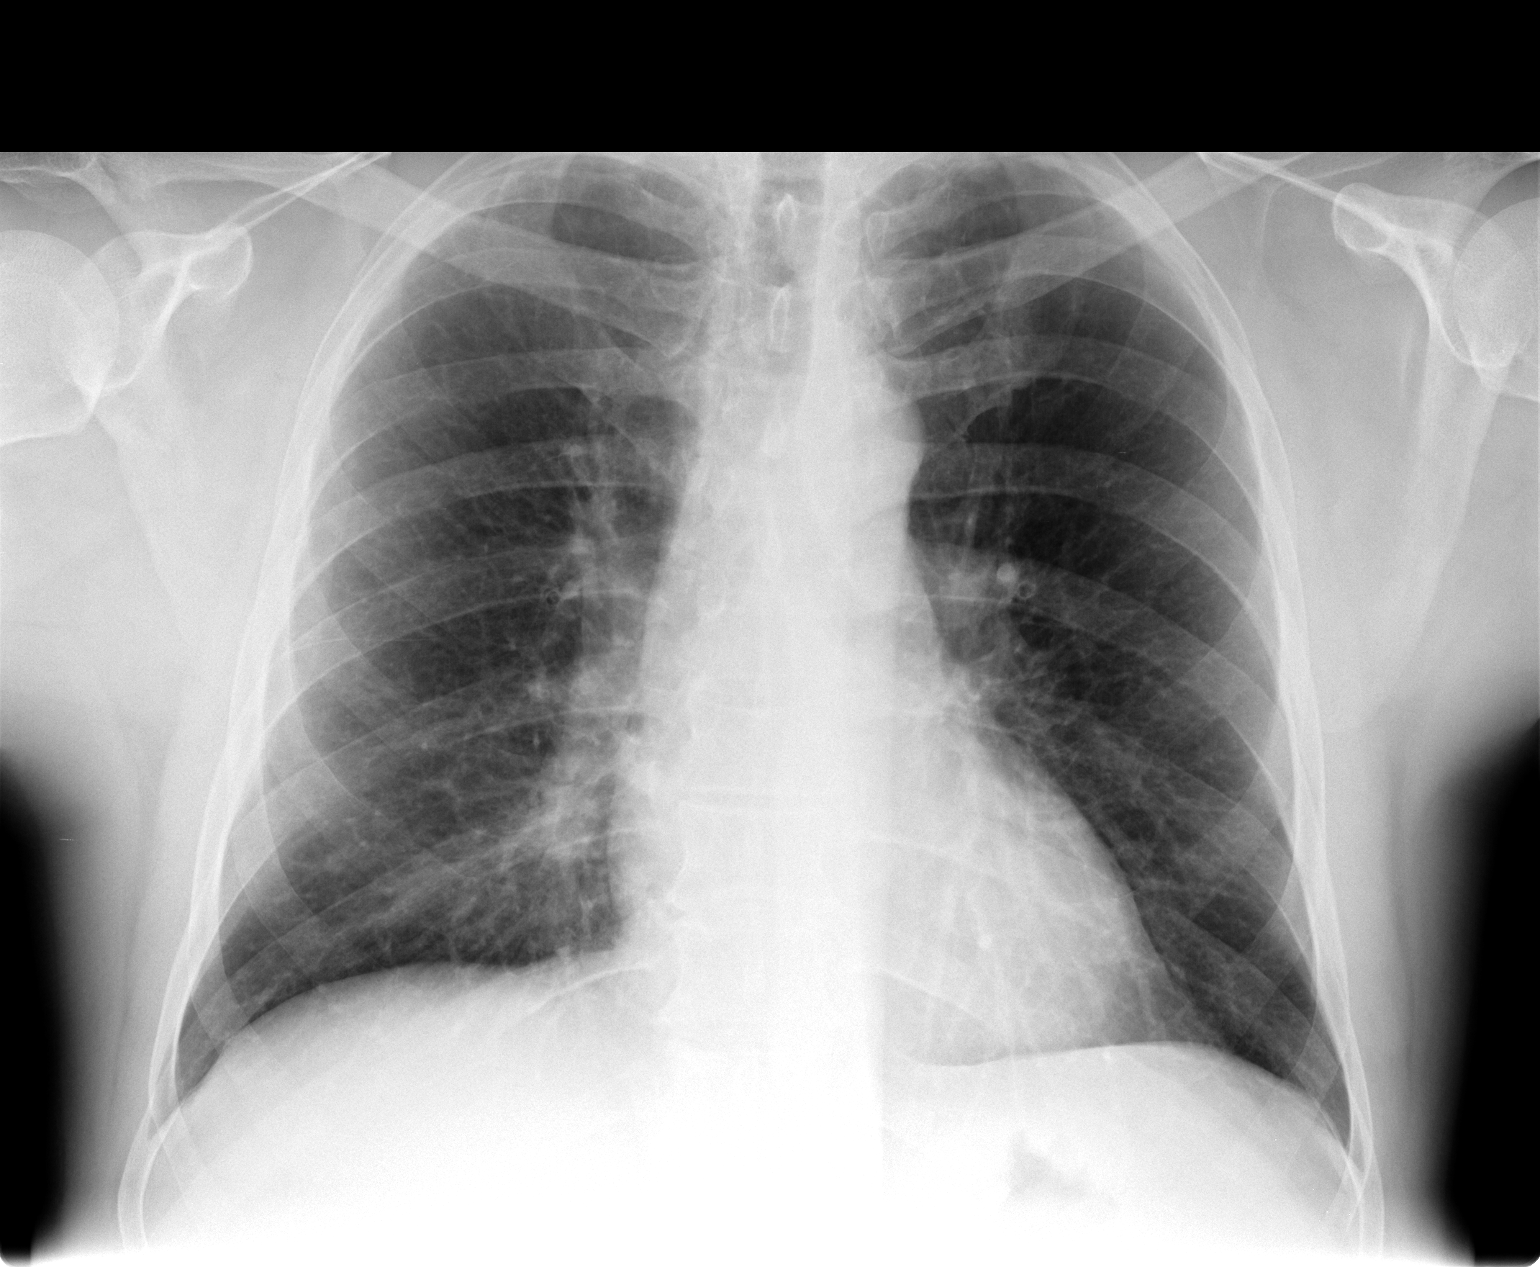

[view not recorded (2 of 2)]
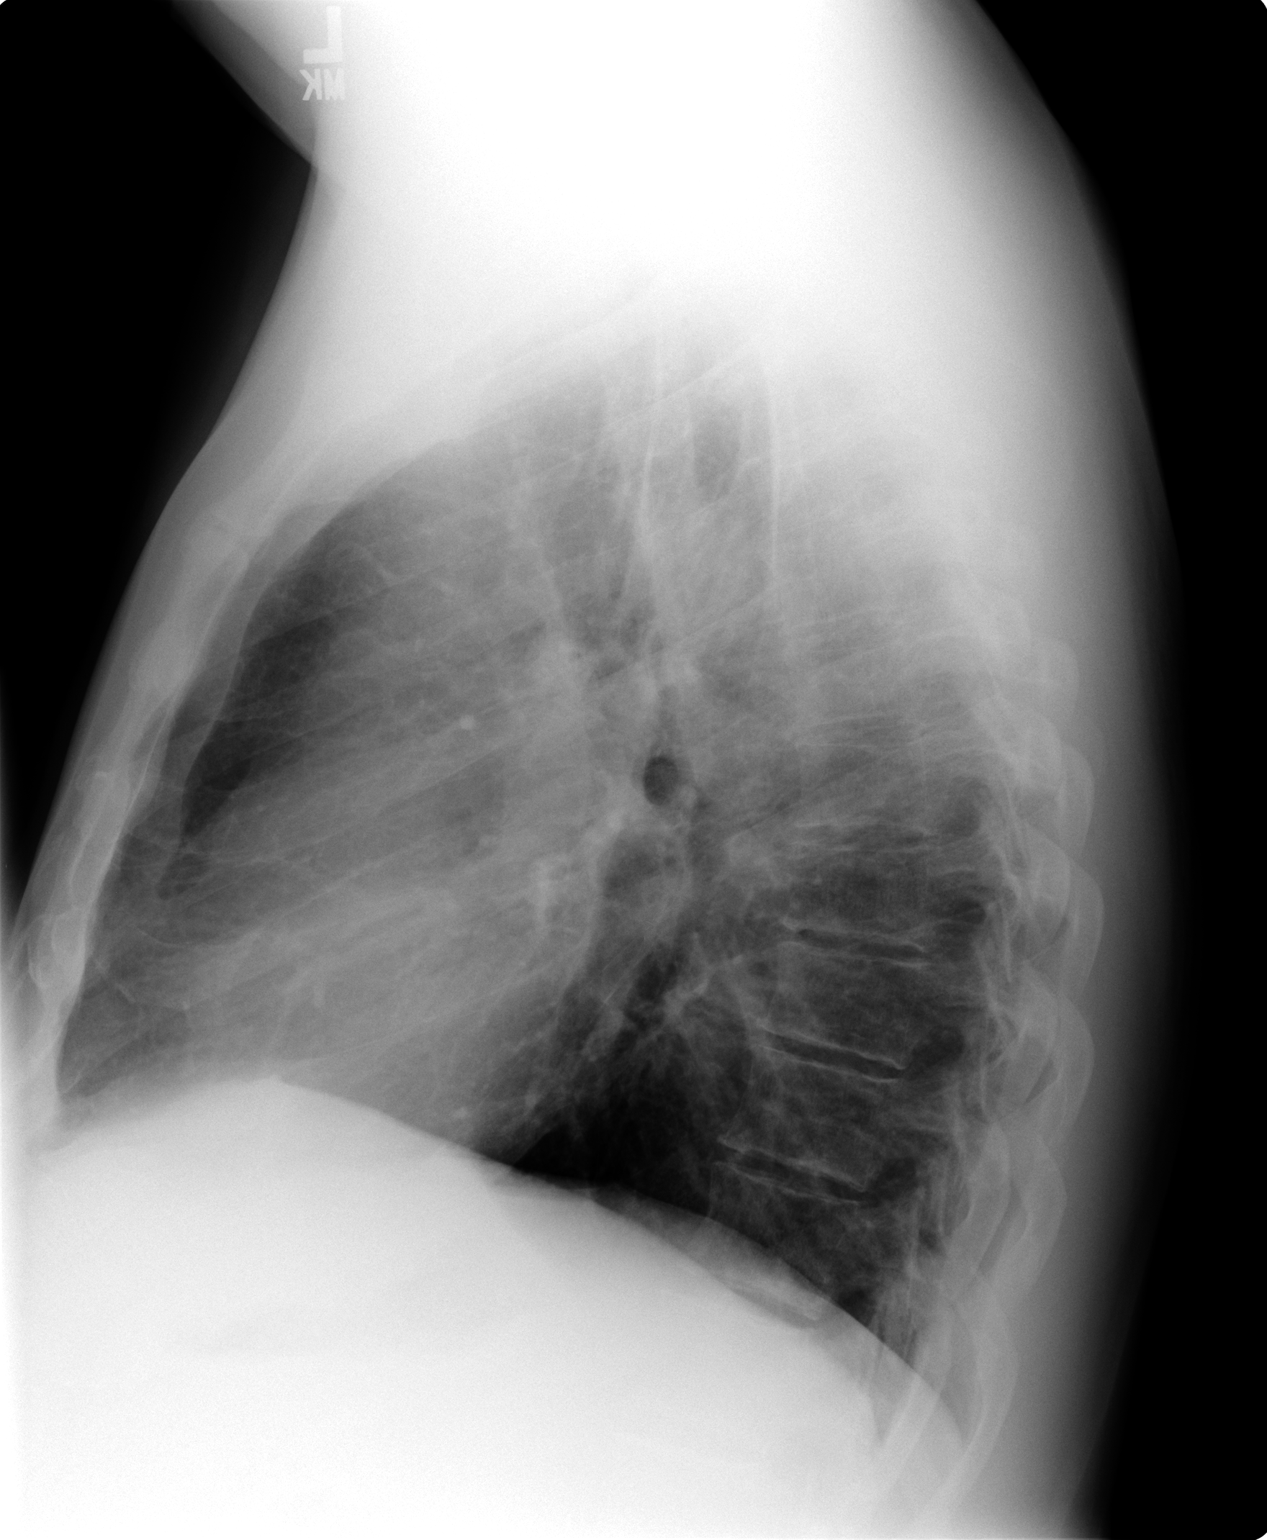

[2 of 2 positions shown; findings below may reference images not displayed]

FINDINGS: Trachea is midline.  Heart size normal.  There is
biapical pleural parenchymal scarring.  Lungs are otherwise clear.
No pleural fluid.
IMPRESSION: No acute findings.

## 2010-05-21 ENCOUNTER — Encounter: Admission: RE | Admit: 2010-05-21 | Discharge: 2010-05-21 | Payer: Self-pay | Admitting: Internal Medicine

## 2010-05-21 IMAGING — CR DG ELBOW 2V*R*
2 series · 2 of 2 positions shown · non-contrast
Comparison: None.

CLINICAL DATA: Lateral elbow pain and tenderness.  No history of
trauma

RIGHT ELBOW - 2 VIEW

[x elbow joint ap right]
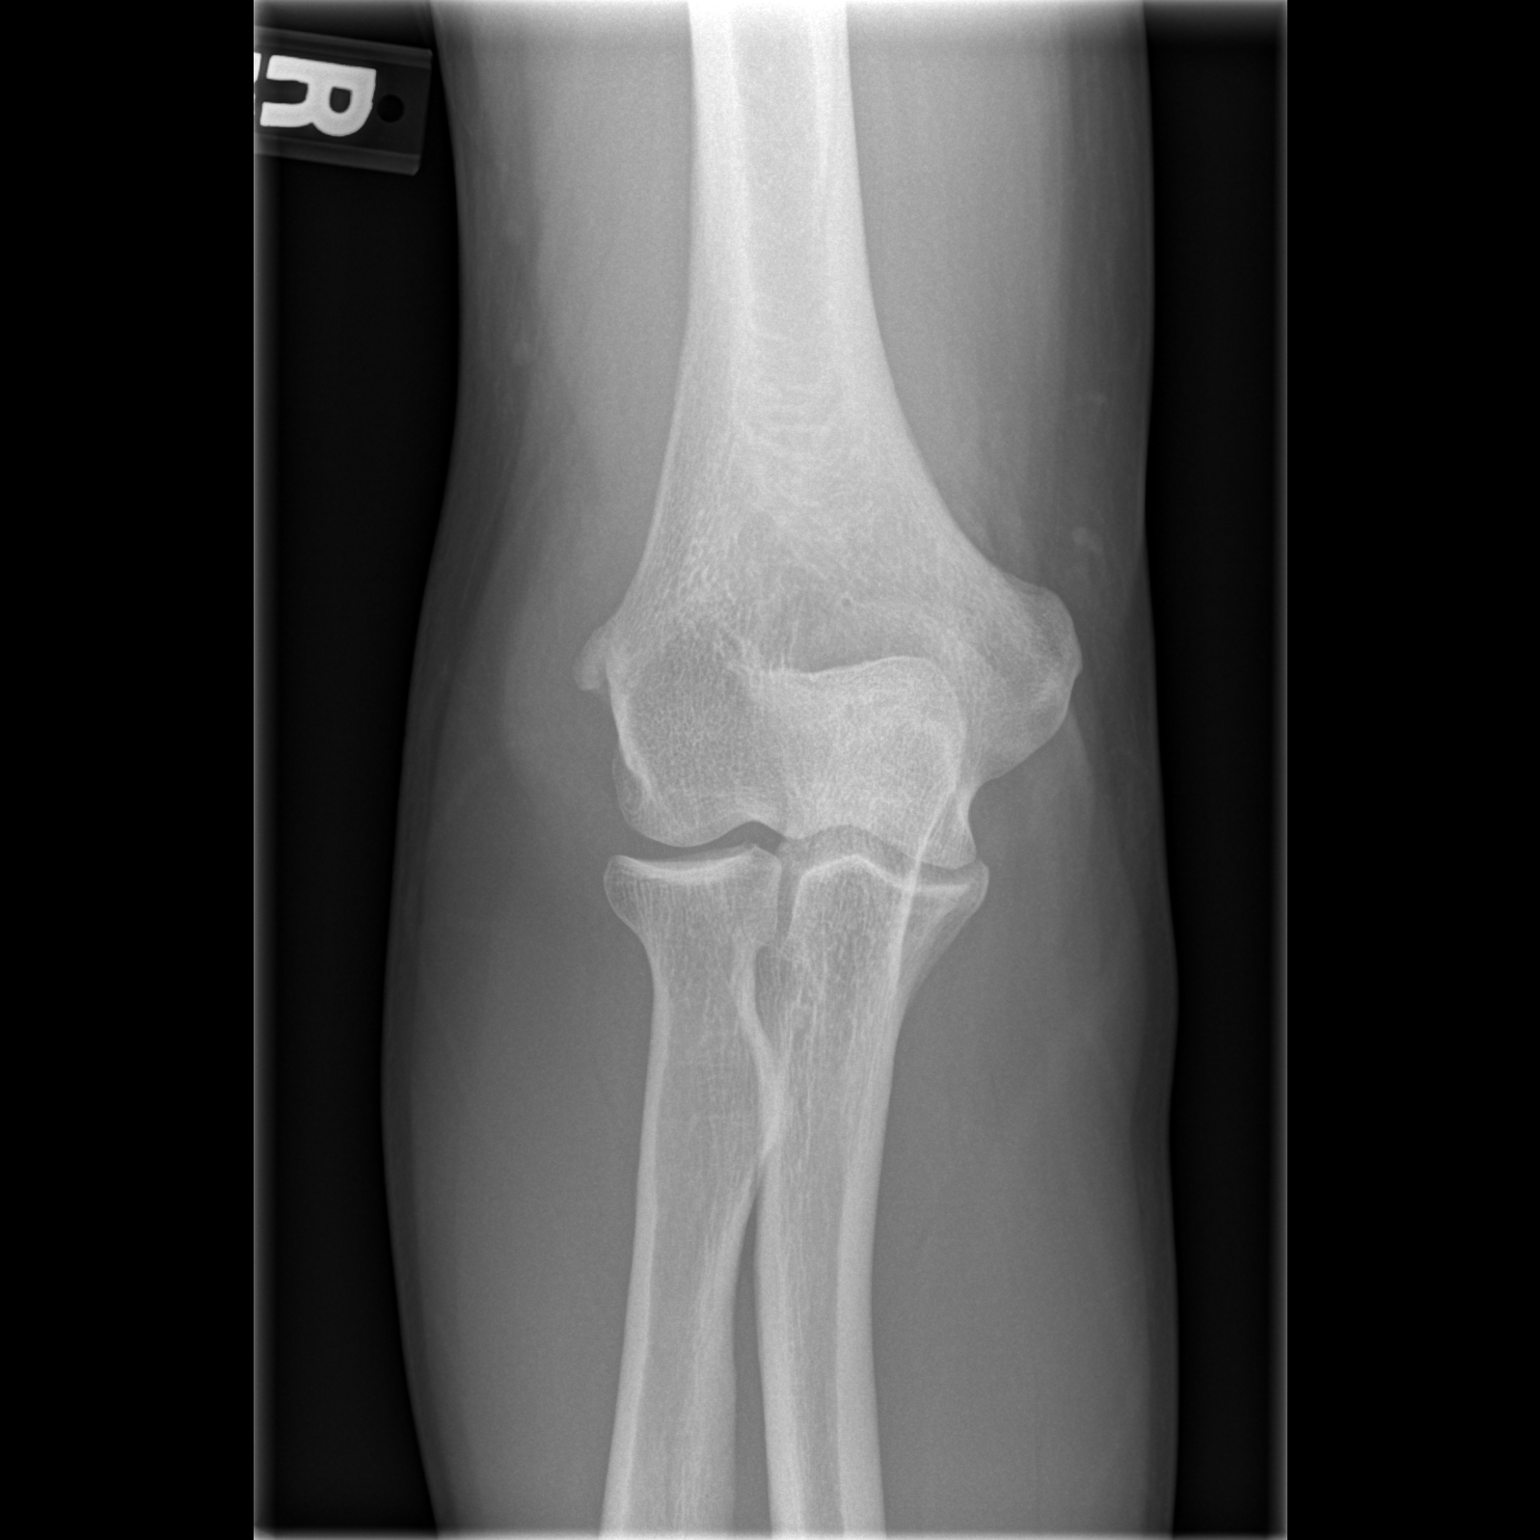

[x elbow joint lat right]
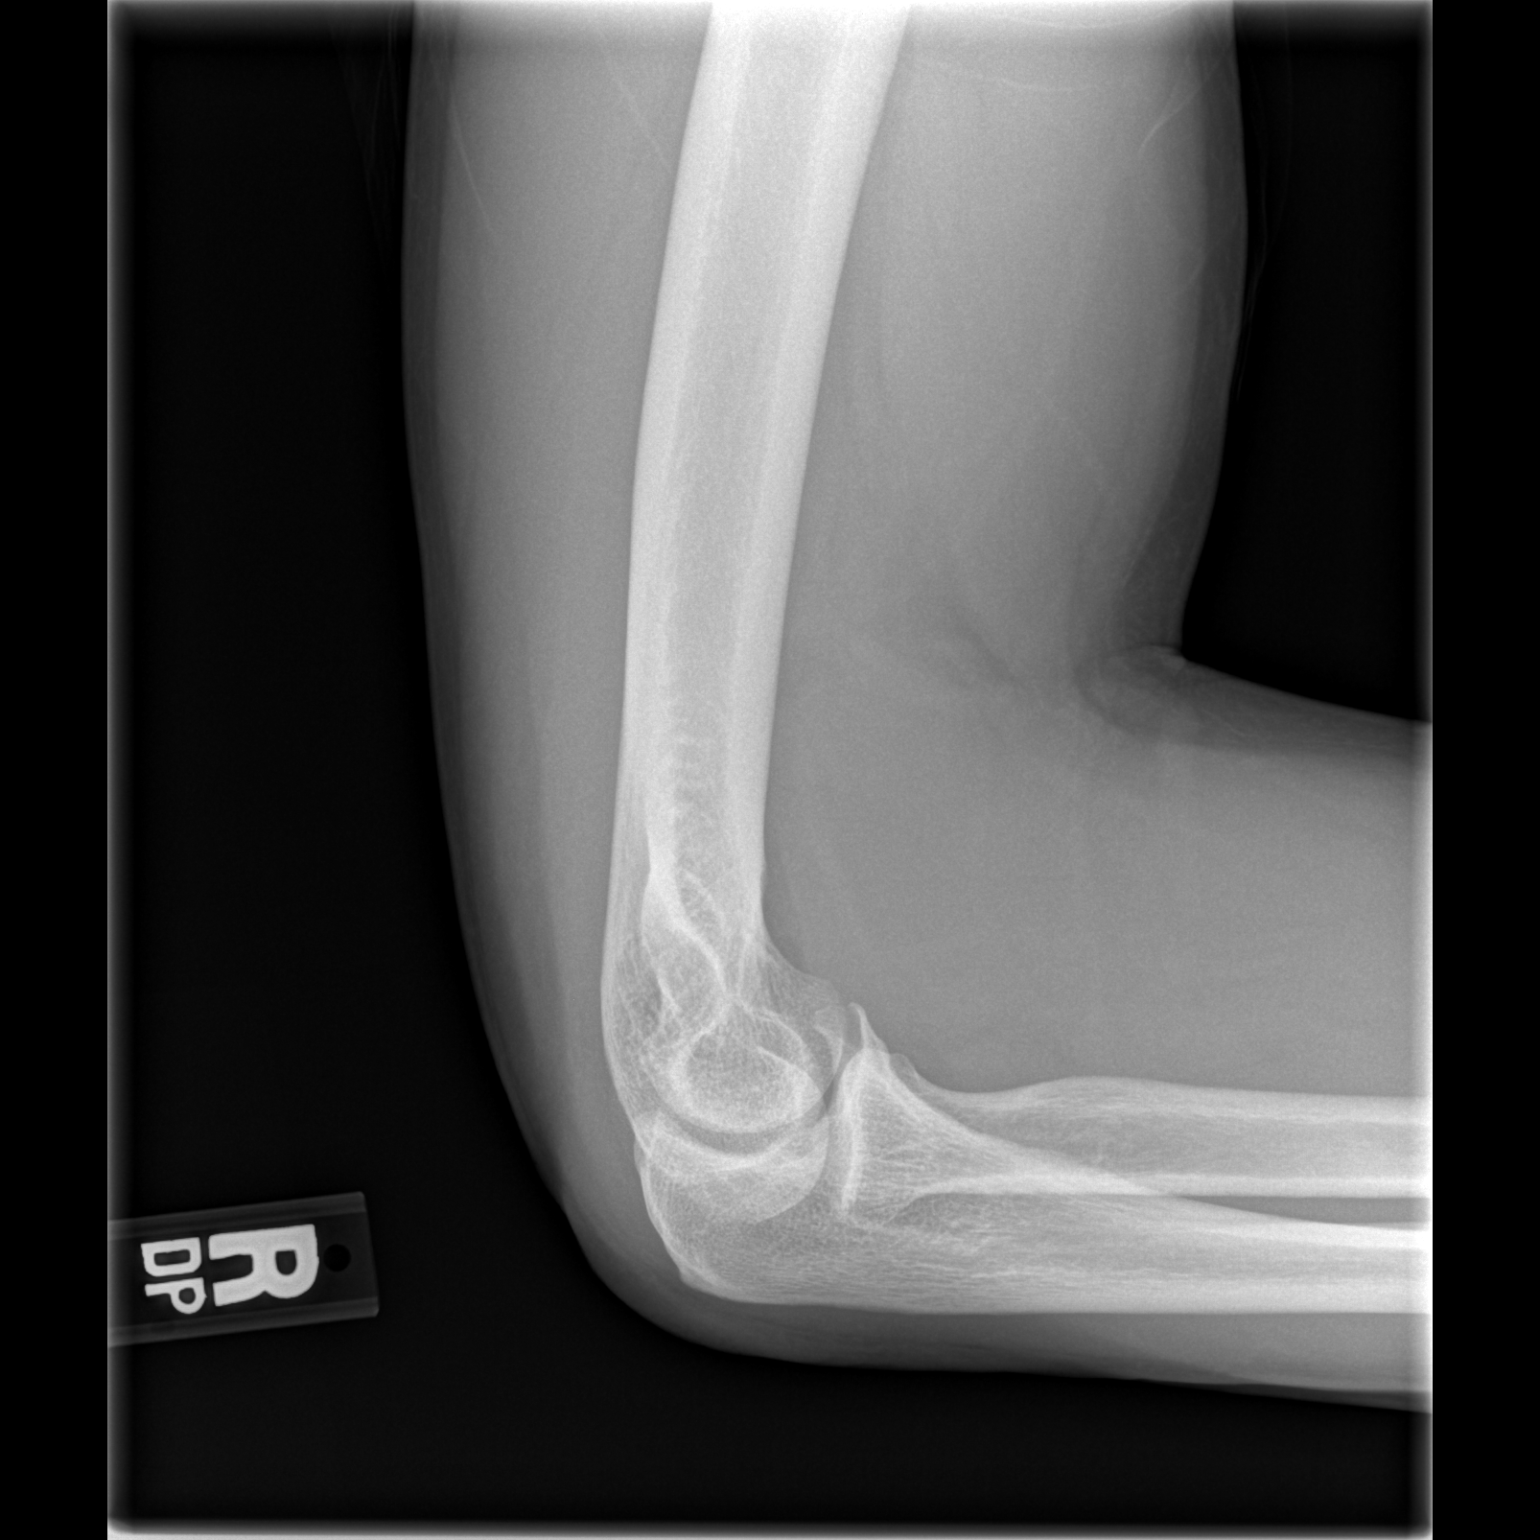

[2 of 2 positions shown; findings below may reference images not displayed]

FINDINGS: Bone density is within normal limits.  No evidence for
acute fracture dislocation is seen.  The joint space appears
maintained.  There is incomplete union of the medial epicondylar
epiphysis incidentally noted.

No focal soft tissue abnormality or joint effusion is seen.
IMPRESSION: Negative for acute or worrisome focal abnormality.

## 2011-01-17 ENCOUNTER — Encounter (HOSPITAL_COMMUNITY): Payer: Managed Care, Other (non HMO)

## 2011-01-17 ENCOUNTER — Other Ambulatory Visit: Payer: Self-pay | Admitting: Podiatry

## 2011-01-17 ENCOUNTER — Ambulatory Visit (HOSPITAL_COMMUNITY)
Admission: RE | Admit: 2011-01-17 | Discharge: 2011-01-17 | Disposition: A | Discharge status: Home / Self Care | Payer: Managed Care, Other (non HMO) | Source: Ambulatory Visit | Attending: Podiatry | Admitting: Podiatry

## 2011-01-17 ENCOUNTER — Other Ambulatory Visit (HOSPITAL_COMMUNITY): Payer: Self-pay | Admitting: Podiatry

## 2011-01-17 DIAGNOSIS — Z01818 Encounter for other preprocedural examination: Secondary | ICD-10-CM

## 2011-01-17 DIAGNOSIS — Z01812 Encounter for preprocedural laboratory examination: Secondary | ICD-10-CM | POA: Insufficient documentation

## 2011-01-17 DIAGNOSIS — M19079 Primary osteoarthritis, unspecified ankle and foot: Secondary | ICD-10-CM | POA: Insufficient documentation

## 2011-01-17 DIAGNOSIS — M205X9 Other deformities of toe(s) (acquired), unspecified foot: Secondary | ICD-10-CM

## 2011-01-17 LAB — SURGICAL PCR SCREEN
MRSA, PCR: NEGATIVE
Staphylococcus aureus: NEGATIVE

## 2011-01-17 LAB — BASIC METABOLIC PANEL
BUN: 12 mg/dL (ref 6–23)
Calcium: 9.4 mg/dL (ref 8.4–10.5)
Creatinine, Ser: 1.03 mg/dL (ref 0.4–1.5)
GFR calc Af Amer: >60 mL/min (ref >60)
Glucose, Bld: 161 mg/dL — ABNORMAL HIGH (ref 70–99)
Sodium: 139 mEq/L (ref 135–145)

## 2011-01-17 LAB — HEMOGLOBIN AND HEMATOCRIT, BLOOD: Hemoglobin: 14.4 g/dL (ref 13.0–17.0)

## 2011-01-17 IMAGING — CR DG FOOT COMPLETE 3+V*L*
3 series · 3 of 3 positions shown · non-contrast
Comparison: None.

CLINICAL DATA: Preop for surgery on left great toe

LEFT FOOT - COMPLETE 3+ VIEW

[view not recorded (1 of 3)]
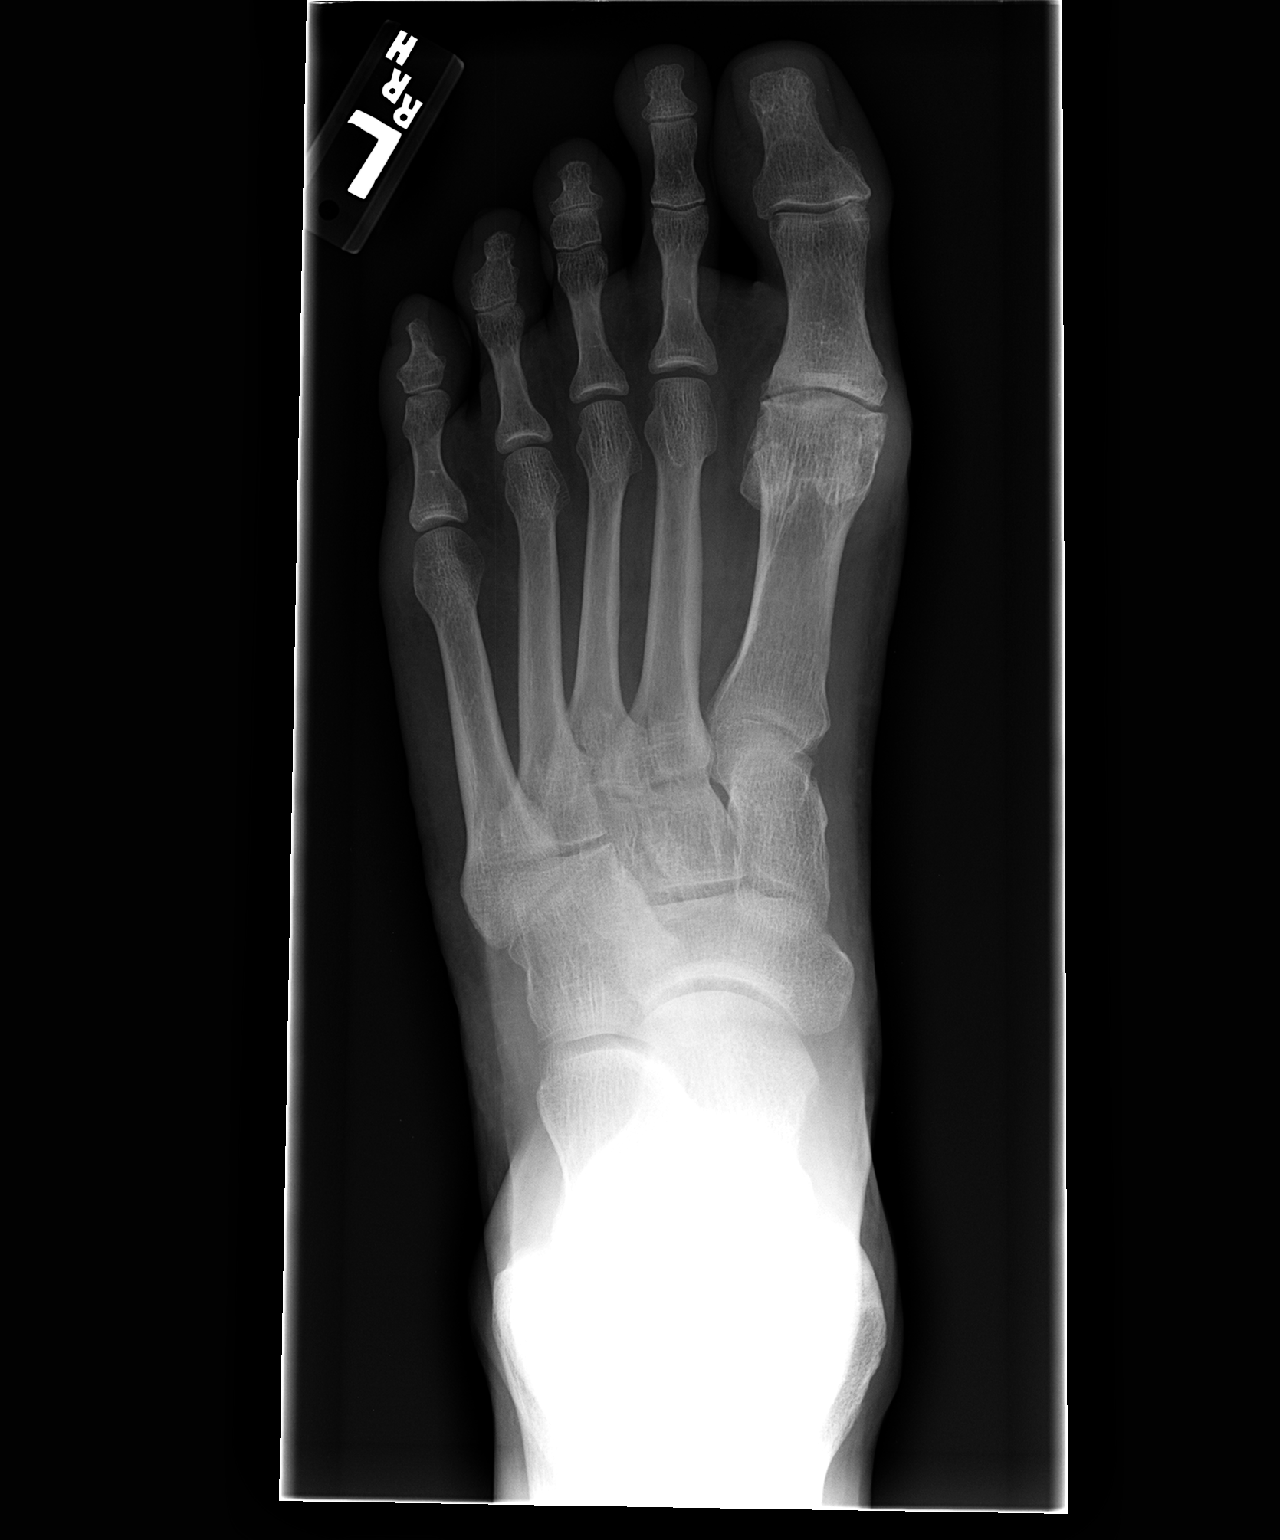

[view not recorded (2 of 3)]
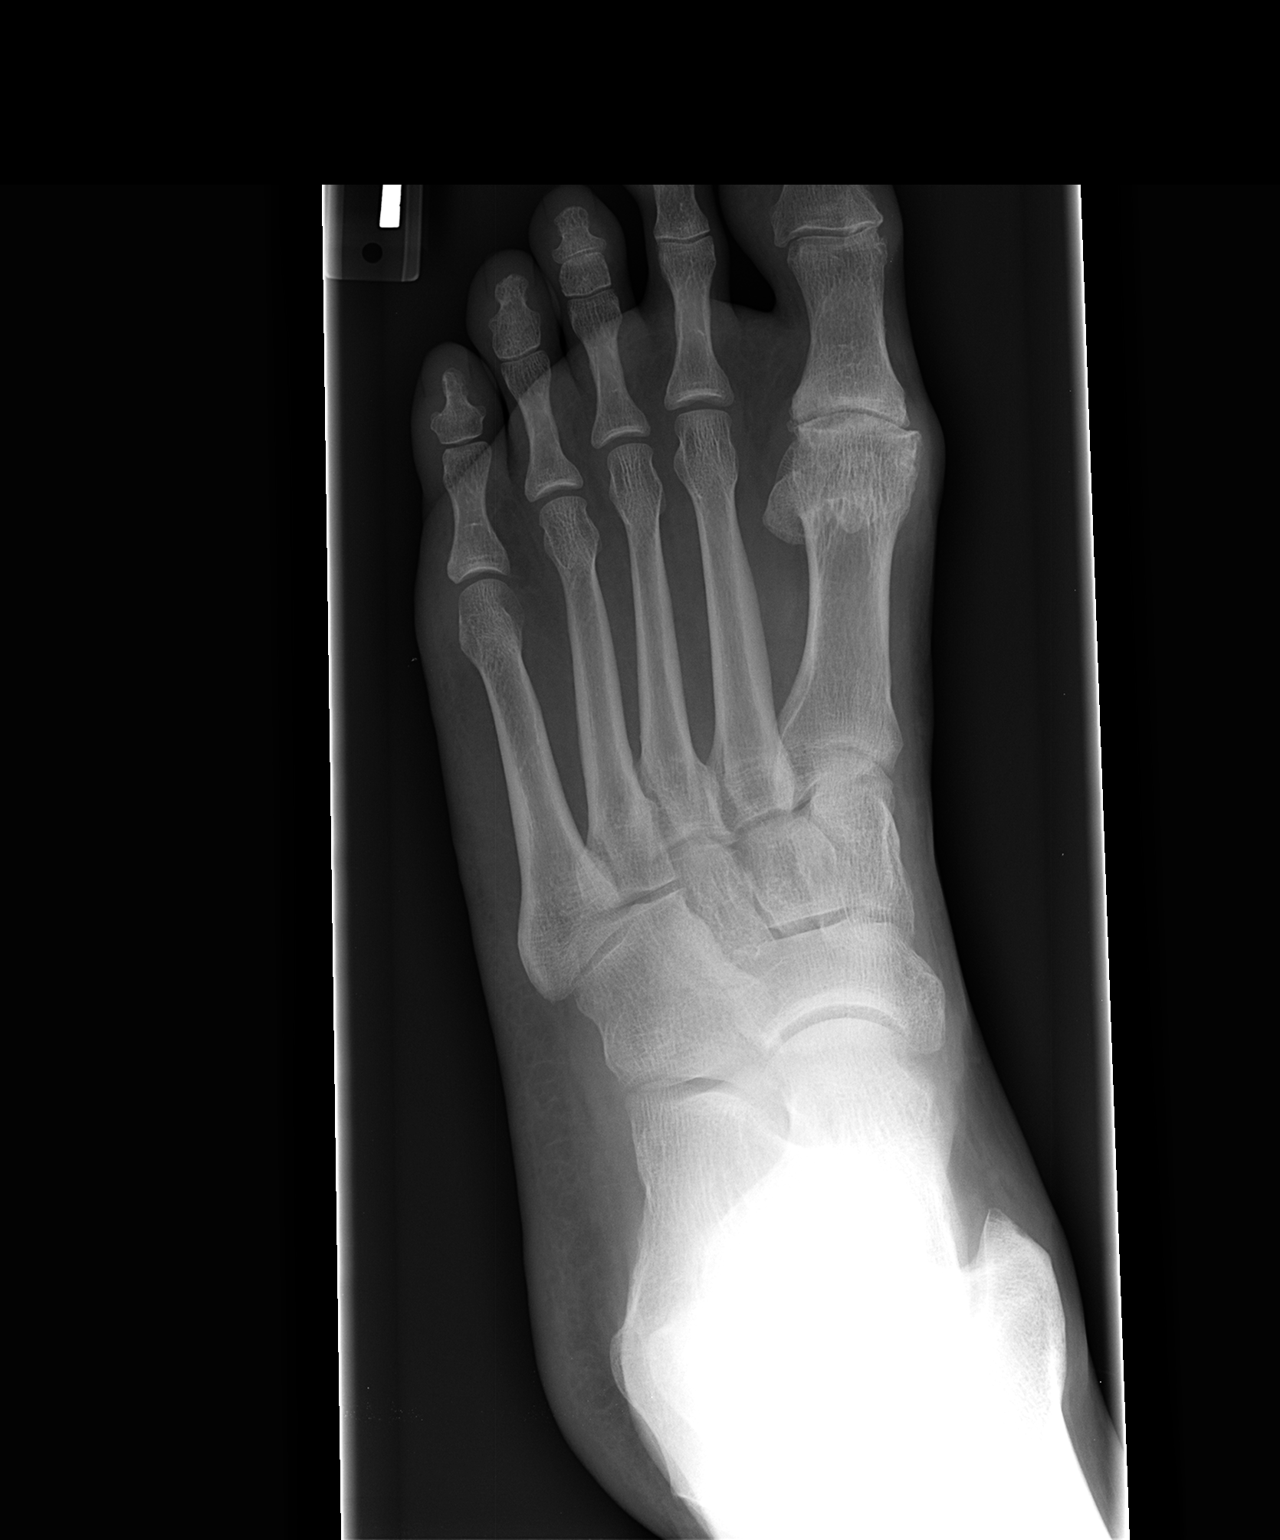

[view not recorded (3 of 3)]
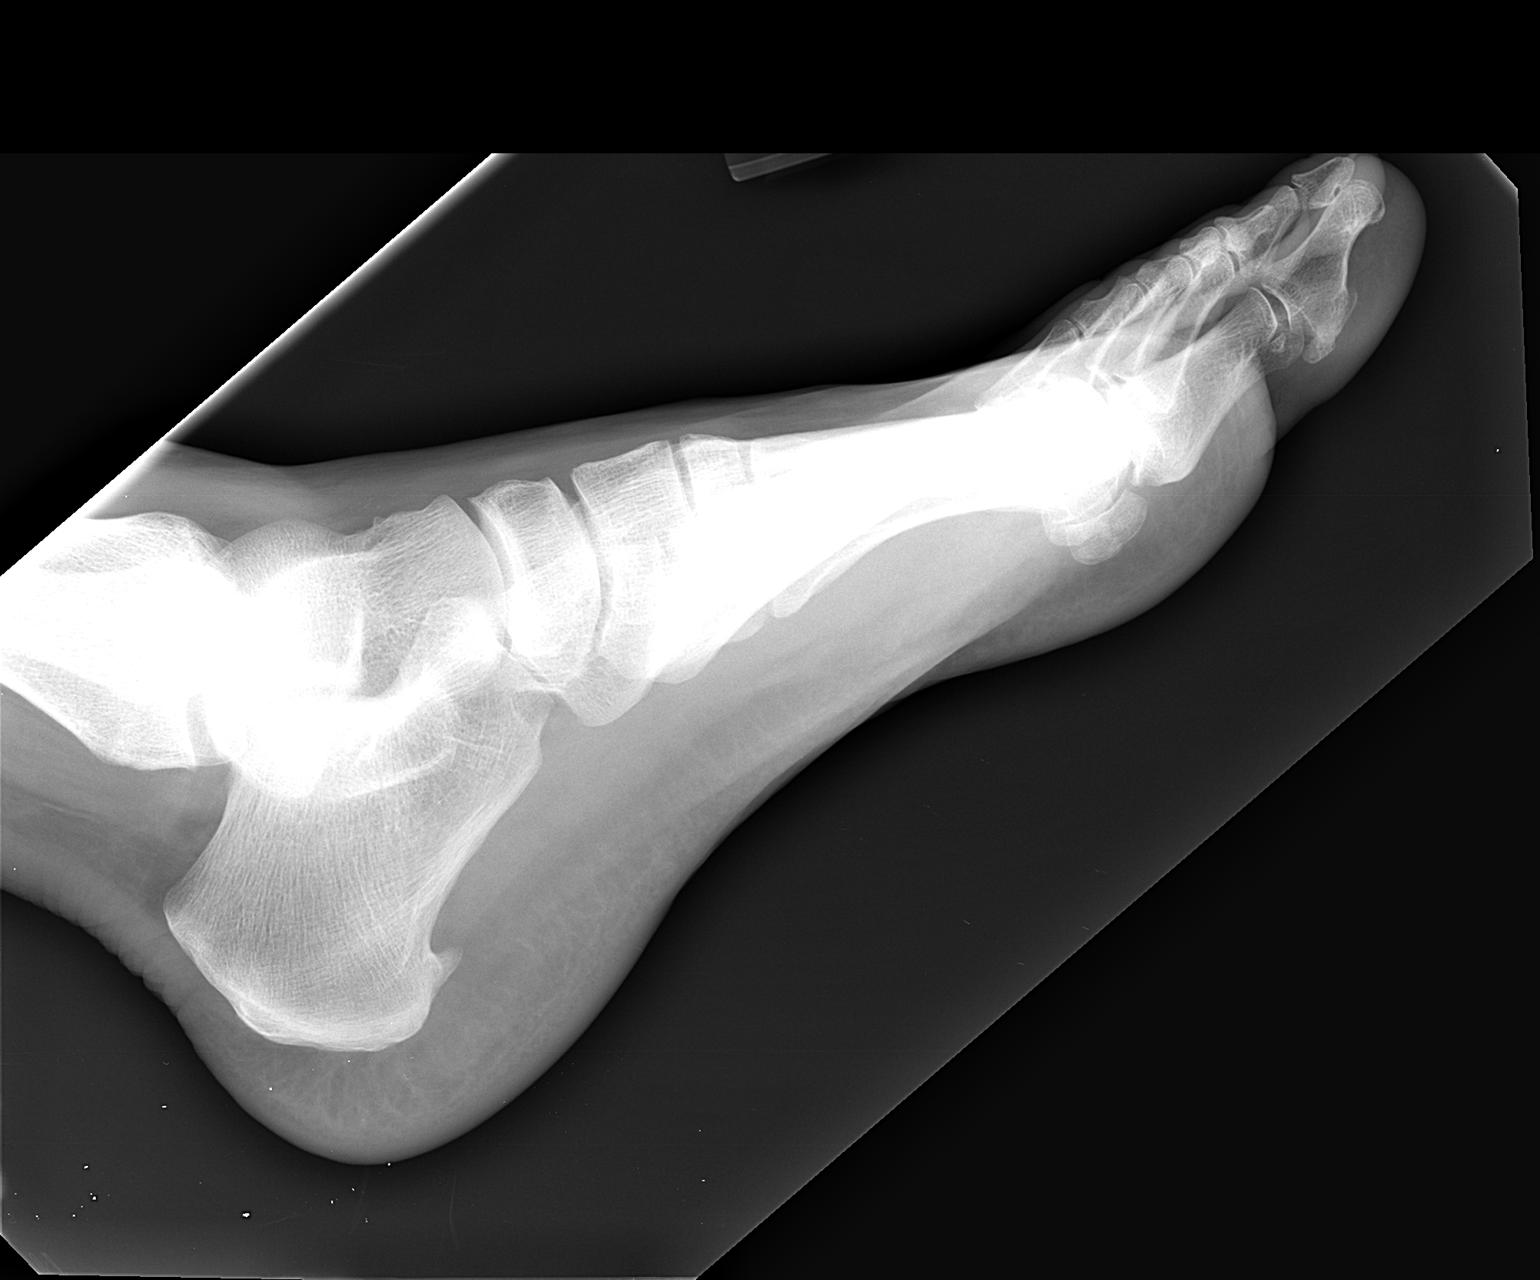

[3 of 3 positions shown; findings below may reference images not displayed]

FINDINGS: There is degenerative joint disease involving the left
first MTP joint with loss of joint space, sclerosis, and spurring.
The remainder of joint spaces appear normal.  No erosion is seen.
Tarsal - metatarsal alignment is normal.
IMPRESSION: Degenerative joint disease of the left first MTP joint.  No
erosion.

## 2011-01-20 ENCOUNTER — Institutional Professional Consult (permissible substitution) (INDEPENDENT_AMBULATORY_CARE_PROVIDER_SITE_OTHER): Payer: Managed Care, Other (non HMO) | Admitting: Internal Medicine

## 2011-01-20 ENCOUNTER — Encounter: Payer: Self-pay | Admitting: Internal Medicine

## 2011-01-20 DIAGNOSIS — I10 Essential (primary) hypertension: Secondary | ICD-10-CM | POA: Insufficient documentation

## 2011-01-20 DIAGNOSIS — E785 Hyperlipidemia, unspecified: Secondary | ICD-10-CM | POA: Insufficient documentation

## 2011-01-20 DIAGNOSIS — F17200 Nicotine dependence, unspecified, uncomplicated: Secondary | ICD-10-CM

## 2011-01-20 DIAGNOSIS — Z87891 Personal history of nicotine dependence: Secondary | ICD-10-CM | POA: Insufficient documentation

## 2011-01-20 DIAGNOSIS — J449 Chronic obstructive pulmonary disease, unspecified: Secondary | ICD-10-CM

## 2011-01-20 HISTORY — DX: Essential (primary) hypertension: I10

## 2011-01-23 ENCOUNTER — Ambulatory Visit (HOSPITAL_COMMUNITY)
Admission: RE | Admit: 2011-01-23 | Discharge: 2011-01-23 | Disposition: A | Discharge status: Home / Self Care | Payer: Managed Care, Other (non HMO) | Source: Ambulatory Visit | Attending: Podiatry | Admitting: Podiatry

## 2011-01-23 ENCOUNTER — Other Ambulatory Visit: Payer: Self-pay

## 2011-01-23 ENCOUNTER — Ambulatory Visit (HOSPITAL_COMMUNITY): Payer: Managed Care, Other (non HMO)

## 2011-01-23 DIAGNOSIS — M2020 Hallux rigidus, unspecified foot: Secondary | ICD-10-CM | POA: Insufficient documentation

## 2011-01-23 DIAGNOSIS — J449 Chronic obstructive pulmonary disease, unspecified: Secondary | ICD-10-CM | POA: Insufficient documentation

## 2011-01-23 DIAGNOSIS — J4489 Other specified chronic obstructive pulmonary disease: Secondary | ICD-10-CM | POA: Insufficient documentation

## 2011-01-23 IMAGING — CR DG FOOT COMPLETE 3+V*L*
3 series · 3 of 3 positions shown · non-contrast
Comparison: [DATE]

CLINICAL DATA: Post bone spur excision

LEFT FOOT - COMPLETE 3+ VIEW

[view not recorded (1 of 3)]
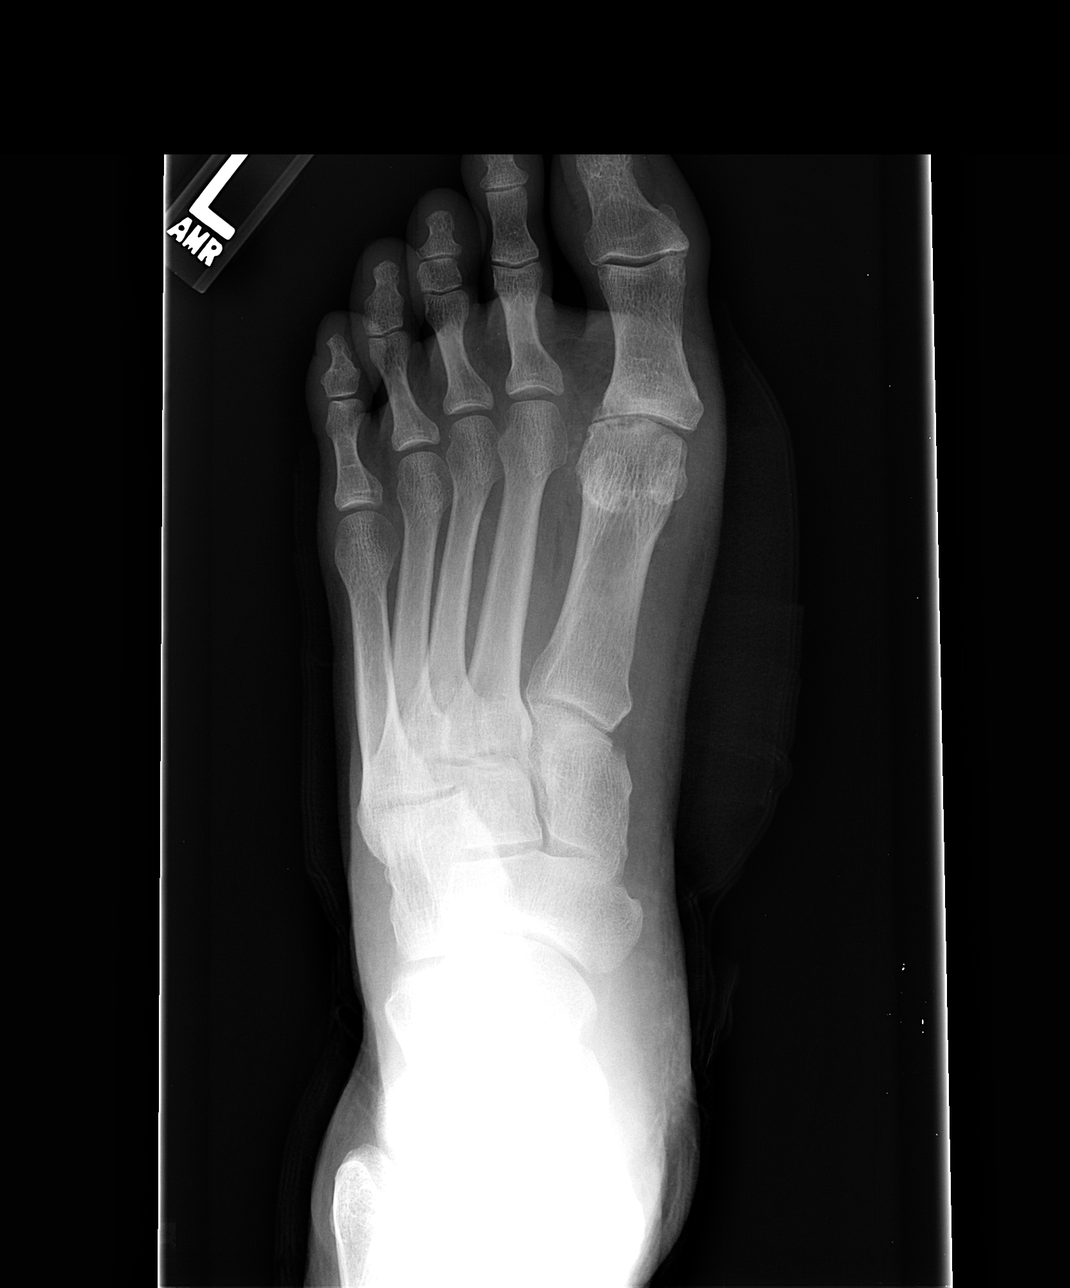

[view not recorded (2 of 3)]
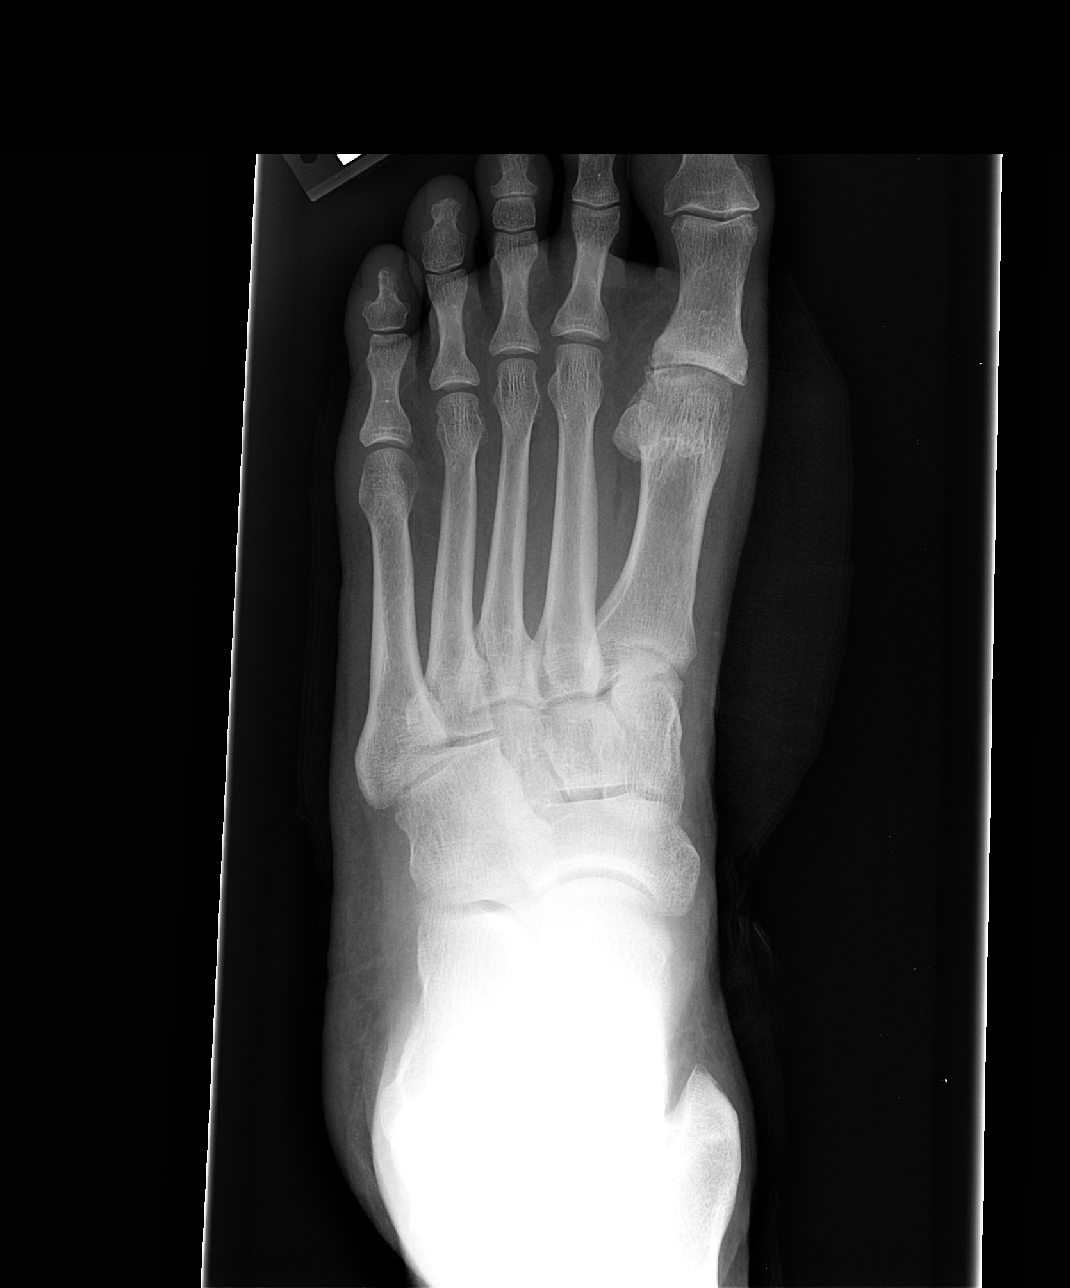

[view not recorded (3 of 3)]
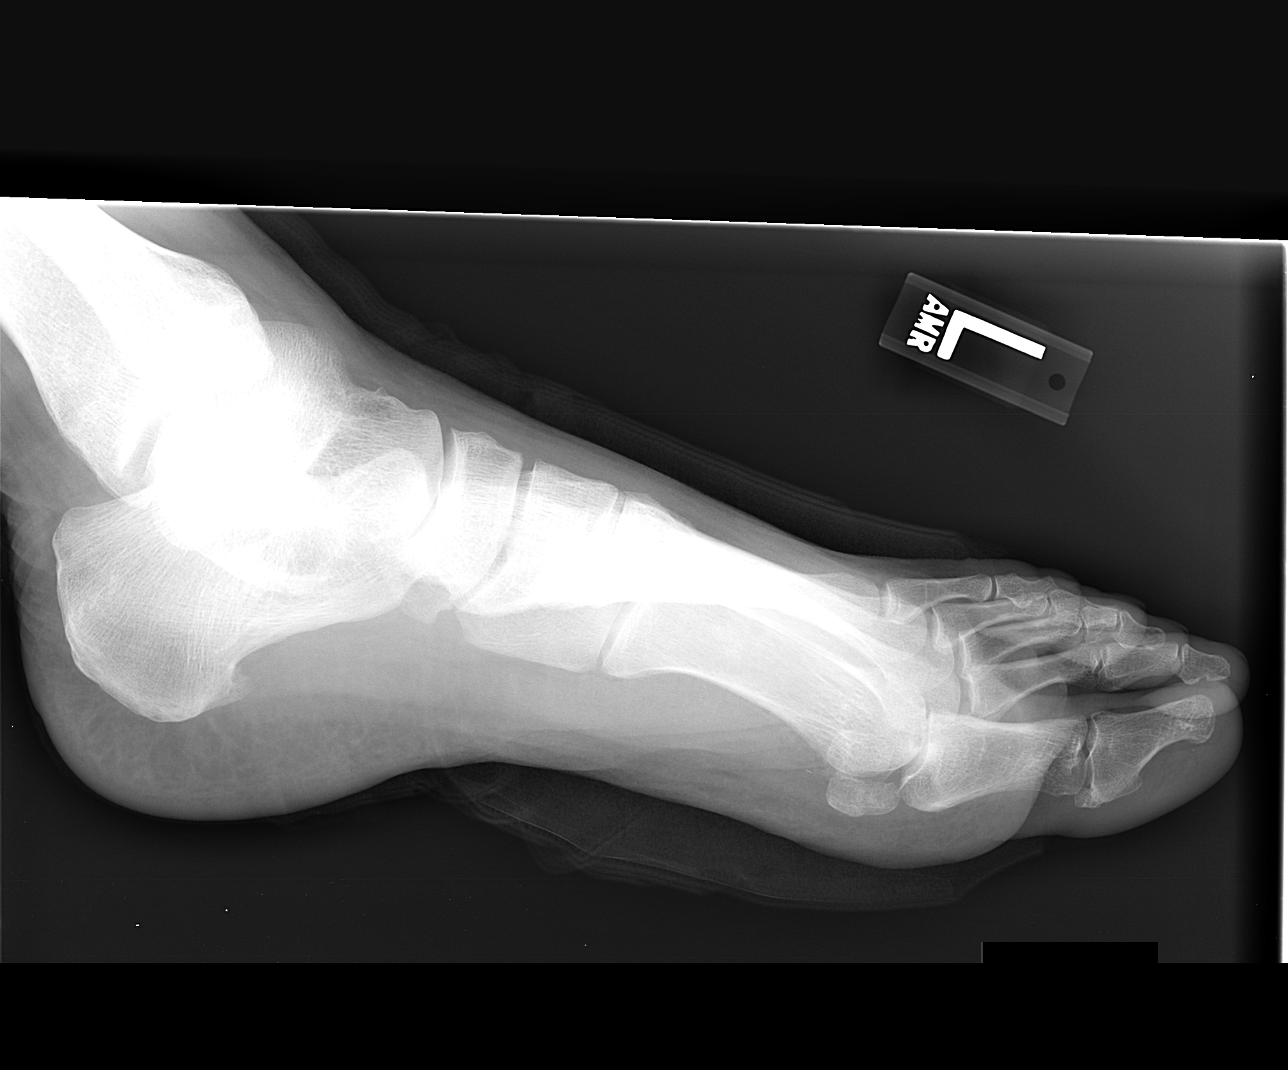

[3 of 3 positions shown; findings below may reference images not displayed]

FINDINGS: Joint space narrowing and minimal spurring at first MTP joint.
Dorsal spurs at the first metatarsal head have been resected in the
interval.
Minimal associated soft tissue swelling.
Mild subchondral cystic changes at first metatarsal head.
No acute fracture or dislocation.
Small plantar calcaneal spur.
IMPRESSION: Interval resection of osteophytes at first metatarsal head.
Degenerative joint disease first MTP joint.
Small plantar calcaneal spur.

## 2011-01-30 NOTE — Assessment & Plan Note (Signed)
Summary: CONSULT FOR COPD//SH   Visit Type:  Initial Consult Copy to:  Dr. Allena Katz Primary Provider/Referring Provider:  Dr. Allena Katz, [facility]  CC:  Pulmonary Consult COPD. Pt states he is schedule for surgery on left foot next week. Marland Kitchen  History of Present Illness: January 20, 2011  55 year old male, 70 pack smoker, ex-etoh (quit 2011) states PMD unsure if he has copd or not (pmd records not available currently) and therefore here. States that he has chronic dyspnea of insidious onset for a few years. Brought on by moderate exertion but relieved by rest. Activities such as raking leaves bring on dyspnea. Dust, pollen, weather change do not provoke dyspnea. Dyspnea does not bother his lifestyle and he does not think it is a big issue. He has been on advair since 2008 which he thinks might be helping. Dyspnea associated with mild "smoker's cough" for years that is associtaed with mild brown sputum. Denies associated chest pain, edema, hemoptysis, weight loss  Of note used to be seen by Dr Shaune Pollack in the past. Debera Lat dictation in 2007 states normal PFts in 2007. CXR 2011 did not show hyperfinlation and this is why patient does not think he has copd    Preventive Screening-Counseling & Management  Alcohol-Tobacco     Smoking Status: current     Packs/Day: 1.5     Year Started: 1977     Pack years: 52.5  Current Medications (verified): 1)  Advair Diskus 250-50 Mcg/dose Aepb (Fluticasone-Salmeterol) .... One Puff Twice Daily 2)  Allopurinol 100 Mg Tabs (Allopurinol) .... Take 1 Tablet By Mouth Two Times A Day 3)  Pantoprazole Sodium 40 Mg Tbec (Pantoprazole Sodium) .... Take 1 Tablet By Mouth Two Times A Day 4)  Simvastatin 10 Mg Tabs (Simvastatin) .... Take 1 Tab By Mouth At Bedtime\par  Allergies (verified): 1)  ! * Anti-Inflammatory  Past History:  Past Medical History: Hyperlipidemia Hypertension Gout Neg cardiac stress test 2006 COPD Rash in left chest ..Marland KitchenMarland KitchenDr  Charlton Haws  - since 2011, bx feb 2012 pending result  Past Surgical History: Appendectomy Tonsillectomy Left wrist surgery  Family History: Father-MI age 12 died 30 but did have COPD/emphysema Mom - no llung disease Daughter Tenny Craw - asthma  Social History: Married Works as a Lawyer Current smoker. Started in 1977, avg 1.5 ppd.  ETOH-quit on 1-3-2011Smoking Status:  current Packs/Day:  1.5 Pack years:  52.5  Review of Systems       The patient complains of shortness of breath with activity and productive cough.  The patient denies shortness of breath at rest, non-productive cough, coughing up blood, chest pain, irregular heartbeats, acid heartburn, indigestion, loss of appetite, weight change, abdominal pain, difficulty swallowing, sore throat, tooth/dental problems, headaches, nasal congestion/difficulty breathing through nose, sneezing, itching, ear ache, anxiety, depression, hand/feet swelling, joint stiffness or pain, rash, change in color of mucus, and fever.    Vital Signs:  Patient profile:   55 year old male Height:      74 inches Weight:      226.25 pounds BMI:     29.15 O2 Sat:      97 % on Room air Temp:     98.0 degrees F oral Pulse rate:   67 / minute BP sitting:   132 / 90  (right arm) Cuff size:   regular  Vitals Entered By: Carron Curie CMA (January 20, 2011 9:24 AM)  O2 Flow:  Room air CC: Pulmonary Consult COPD. Pt states  he is schedule for surgery on left foot next week.  Comments Medications reviewed with patient Carron Curie CMA  January 20, 2011 9:25 AM Daytime phone number verified with patient.    Physical Exam  General:  well developed, well nourished, in no acute distress Head:  normocephalic and atraumatic Eyes:  PERRLA/EOM intact; conjunctiva and sclera clear Ears:  TMs intact and clear with normal canals Nose:  no deformity, discharge, inflammation, or lesions Mouth:  no deformity or lesions Neck:  no masses,  thyromegaly, or abnormal cervical nodes Chest Wall:  no deformities noted rash + left suprammary area Lungs:  clear bilaterally to auscultation and percussion Heart:  regular rate and rhythm, S1, S2 without murmurs, rubs, gallops, or clicks Abdomen:  bowel sounds positive; abdomen soft and non-tender without masses, or organomegaly Msk:  no deformity or scoliosis noted with normal posture Pulses:  pulses normal Extremities:  no clubbing, cyanosis, edema, or deformity noted Neurologic:  CN II-XII grossly intact with normal reflexes, coordination, muscle strength and tone Skin:  intact without lesions or rashes Cervical Nodes:  no significant adenopathy Axillary Nodes:  no significant adenopathy Psych:  alert and cooperative; normal mood and affect; normal attention span and concentration   CXR  Procedure date:  05/06/2010  Findings:         Clinical Data: Cough and congestion.    CHEST - 2 VIEW    Comparison: 11/02 S6.    Findings: Trachea is midline.  Heart size normal.  There is   biapical pleural parenchymal scarring.  Lungs are otherwise clear.   No pleural fluid.    IMPRESSION:   No acute findings.    Read By:  Reyes Ivan.,  M.D.   Released By:  Reyes Ivan.,  M.D.  Comments:      indepdendently reviwed  Impression & Recommendations:  Problem # 1:  C O P D (ICD-496) Assessment New at risk  plan get full pft after being off advvair for 2  weeks walking test check alpha 1 due to family hx  Problem # 2:  TOBACCO ABUSE (ICD-305.1) Assessment: New  counselled to quit and importance of quitting emphasized will address more at fu  Orders: Pulmonary Referral (Pulmonary) Consultation Level V (16109)  Medications Added to Medication List This Visit: 1)  Advair Diskus 250-50 Mcg/dose Aepb (Fluticasone-salmeterol) .... One puff twice daily 2)  Allopurinol 100 Mg Tabs (Allopurinol) .... Take 1 tablet by mouth two times a day 3)  Pantoprazole Sodium  40 Mg Tbec (Pantoprazole sodium) .... Take 1 tablet by mouth two times a day 4)  Simvastatin 10 Mg Tabs (Simvastatin) .... Take 1 tab by mouth at bedtime\par Patient Instructions: 1)  you are at risk for copd 2)  to determine severity have  3)   - full breathing test after being off advair for 2 weeks 4)   - have walking test  5)  check your alpha  1 gene test today 6)  stop smoking 7)  return after breathing test  Appended Document: walk update Ambulatory Pulse Oximetry  Resting; HR_65____    02 Sat_97%ra____  Lap1 (185 feet)   HR_88____   02 Sat__95%ra___ Lap2 (185 feet)   HR_92____   02 Sat__95%ra___    Lap3 (185 feet)   HR_97____   02 Sat__95%ra___  _x__Test Completed without Difficulty ___Test Stopped due to: Carver Fila  January 20, 2011 10:17 AM      Clinical Lists Changes

## 2011-01-31 ENCOUNTER — Encounter: Payer: Self-pay | Admitting: Internal Medicine

## 2011-02-11 ENCOUNTER — Ambulatory Visit: Payer: Medicare HMO | Admitting: Internal Medicine

## 2011-02-24 ENCOUNTER — Encounter: Payer: Self-pay | Admitting: Internal Medicine

## 2011-02-26 ENCOUNTER — Encounter: Payer: Self-pay | Admitting: Internal Medicine

## 2011-02-26 ENCOUNTER — Ambulatory Visit (INDEPENDENT_AMBULATORY_CARE_PROVIDER_SITE_OTHER): Payer: Managed Care, Other (non HMO) | Admitting: Internal Medicine

## 2011-02-26 VITALS — BP 104/74 | HR 107 | Temp 98.1°F | Ht 74.0 in | Wt 228.0 lb

## 2011-02-26 DIAGNOSIS — J449 Chronic obstructive pulmonary disease, unspecified: Secondary | ICD-10-CM

## 2011-02-26 DIAGNOSIS — R0609 Other forms of dyspnea: Secondary | ICD-10-CM

## 2011-02-26 DIAGNOSIS — F17200 Nicotine dependence, unspecified, uncomplicated: Secondary | ICD-10-CM

## 2011-02-26 DIAGNOSIS — R0600 Dyspnea, unspecified: Secondary | ICD-10-CM

## 2011-02-26 DIAGNOSIS — R0989 Other specified symptoms and signs involving the circulatory and respiratory systems: Secondary | ICD-10-CM

## 2011-02-26 DIAGNOSIS — Z720 Tobacco use: Secondary | ICD-10-CM

## 2011-02-26 LAB — PULMONARY FUNCTION TEST

## 2011-02-26 NOTE — Assessment & Plan Note (Signed)
He does not have it.

## 2011-02-26 NOTE — Patient Instructions (Signed)
Your breathing test is normal - you do not have copd Your shortness of breath is still unexplained Please have methacholine challenge test for asthma Please call our office once this test is complete - I will call you back with next step after that test is done

## 2011-02-26 NOTE — Progress Notes (Signed)
PFT done today. 

## 2011-02-26 NOTE — Assessment & Plan Note (Signed)
normal PFT April 2012. Normal stress test hx in 2006. Smoker. Overweight.   Plan Get methacholine challenge test off advair - if negative, he does not need mdi anymore If negative, suggest getting fit versus repeat stress cardiac test (he is currently unsure if he needs cardiac stress test)

## 2011-02-26 NOTE — Assessment & Plan Note (Signed)
5 min counseling to quit. He understands need to. States will do on own

## 2011-02-26 NOTE — Progress Notes (Signed)
Subjective:    Patient ID: Aaron [name], male    DOB: [birth year], 55 y.o.   MRN: [id number]  HPI 55 year old male, 68 pack smoker, ex-etoh (quit 2011) states PMD unsure if he has copd or not (pmd records not available currently) and therefore here. States that he has chronic dyspnea of insidious onset for a few years. Brought on by moderate exertion but relieved by rest. Activities such as raking leaves bring on dyspnea. Dust, pollen, weather change do not provoke dyspnea. Dyspnea does not bother his lifestyle and he does not think it is a big issue. He has been on advair since 2008 which he thinks might be helping. Dyspnea associated with mild "smoker's cough" for years that is associtaed with mild brown sputum. Denies associated chest pain, edema, hemoptysis, weight loss. Of note used to be seen by Dr Shaune Pollack in the past. Debera Lat dictation in 2007 states normal PFts in 2007. CXR 2011 did not show hyperfinlation and this is why patient does not think he has copd. Hx of normal cardiac stress test in 2006. Had PFTs today after being off advair for several weeks. The PFTs are entirely normal (personally reviewd trace)   Review of Systems   Constitutional:   No  weight loss, night sweats,  Fevers, chills, fatigue, lassitude. HEENT:   No headaches,  Difficulty swallowing,  Tooth/dental problems,  Sore throat,                No sneezing, itching, ear ache, nasal congestion, post nasal drip,   CV:  No chest pain,  Orthopnea, PND, swelling in lower extremities, anasarca, dizziness, palpitations  GI  No heartburn, indigestion, abdominal pain, nausea, vomiting, diarrhea, change in bowel habits, loss of appetite  Resp:  No excess mucus, no productive cough,  No non-productive cough,  No coughing up of blood.  No change in color of mucus.  No wheezing.  No chest wall deformity  Skin: no rash or lesions.  GU: no dysuria, change in color of urine, no urgency or frequency.  No flank pain.  MS:  No  joint pain or swelling.  No decreased range of motion.  No back pain.  Psych:  No change in mood or affect. No depression or anxiety.  No memory loss.        Objective:   Physical Exam  Nursing note and vitals reviewed. Constitutional: He is oriented to person, place, and time. He appears well-developed and well-nourished. No distress.       oveerweight  HENT:  Head: Normocephalic and atraumatic.  Right Ear: External ear normal.  Left Ear: External ear normal.  Mouth/Throat: Oropharynx is clear and moist. No oropharyngeal exudate.  Eyes: Conjunctivae and EOM are normal. Pupils are equal, round, and reactive to light. Right eye exhibits no discharge. Left eye exhibits no discharge. No scleral icterus.  Neck: Normal range of motion. Neck supple. No JVD present. No tracheal deviation present. No thyromegaly present.  Cardiovascular: Normal rate, regular rhythm and intact distal pulses.  Exam reveals no gallop and no friction rub.   No murmur heard. Pulmonary/Chest: Effort normal and breath sounds normal. No respiratory distress. He has no wheezes. He has no rales. He exhibits no tenderness.  Abdominal: Soft. Bowel sounds are normal. He exhibits no distension and no mass. There is no tenderness. There is no rebound and no guarding.  Musculoskeletal: Normal range of motion. He exhibits no edema and no tenderness.  Lymphadenopathy:    He has  no cervical adenopathy.  Neurological: He is alert and oriented to person, place, and time. He has normal reflexes. No cranial nerve deficit. Coordination normal.  Skin: Skin is warm and dry. No rash noted. He is not diaphoretic. No erythema. No pallor.  Psychiatric: He has a normal mood and affect. His behavior is normal. Judgment and thought content normal.          Assessment & Plan:

## 2011-03-05 ENCOUNTER — Ambulatory Visit (HOSPITAL_COMMUNITY)
Admission: RE | Admit: 2011-03-05 | Discharge: 2011-03-05 | Disposition: A | Discharge status: Home / Self Care | Payer: Managed Care, Other (non HMO) | Source: Ambulatory Visit | Attending: Internal Medicine | Admitting: Internal Medicine

## 2011-03-05 DIAGNOSIS — R0602 Shortness of breath: Secondary | ICD-10-CM | POA: Insufficient documentation

## 2011-03-05 DIAGNOSIS — J4540 Moderate persistent asthma, uncomplicated: Secondary | ICD-10-CM | POA: Insufficient documentation

## 2011-03-13 ENCOUNTER — Encounter: Payer: Self-pay | Admitting: Internal Medicine

## 2011-03-13 ENCOUNTER — Telehealth: Payer: Self-pay | Admitting: Internal Medicine

## 2011-03-13 NOTE — Telephone Encounter (Signed)
Pt states methacholine challenge was done on 03/05/2011. Victorino Dike, have you seen these results.?

## 2011-03-25 NOTE — Telephone Encounter (Signed)
Called and getting results faxed over now

## 2011-03-25 NOTE — Telephone Encounter (Signed)
Please advise MR, results are in your look at. Thanks

## 2011-03-26 NOTE — Op Note (Signed)
  NAME:  Aaron [name], Aaron [name] NO.:  [id number]  MEDICAL RECORD NO.:  [id number]           PATIENT TYPE:  O  LOCATION:  DAYP                          FACILITY:  [facility]  PHYSICIAN:  B. Theola Sequin, MD   DATE OF BIRTH:  Apr 28, 1956  DATE OF PROCEDURE:  01/23/2011 DATE OF DISCHARGE:                              OPERATIVE REPORT   SURGEON:  B. Theola Sequin, MD  ASSISTANT:  Valetta Close.  PREOPERATIVE DIAGNOSIS:  Hallux limitus, ICD-9 735.2, left foot.  POSTOPERATIVE DIAGNOSIS:  Hallux limitus, ICD-9 735.2, left foot.  PROCEDURE:  Cheilectomy of the first metatarsophalangeal joint left foot, CPT code 30865.  ANESTHESIA:  MAC with local.  HEMOSTASIS:  Pneumatic ankle tourniquet at 250 mmHg.  ESTIMATED BLOOD LOSS:  Minimal (less than 5 mL).  INJECTABLES:  0.5% Marcaine plain.  PATHOLOGY:  Bone, left foot.  COMPLICATIONS:  None.  PROCEDURE IN DETAIL:  The patient was brought to the operating room, placed on the operative table in supine position.  Pneumatic ankle tourniquet was placed about the patient's left ankle.  The foot was anesthetized using 0.5% Marcaine plain.  The foot was scrubbed, prepped and draped in the usual sterile manner.  Limb was then elevated, exsanguinated and the pneumatic ankle tourniquet inflated to 250 mmHg.  Attention was direct to the dorsomedial aspect of the left foot where a 6-cm curvilinear incision was made medial and parallel to the extensor hallucis longus tendon.  Incision was continued deep down to the level of first metatarsophalangeal joint.  A linear longitudinal periosteal and capsular incision was performed.  Periosteal and capsular structures were reflected medially and laterally thus exposing the head of the first metatarsal and base of the proximal phalanx of the operative site. Prominent dorsal exostosis was resected and passed from the operative field.  All bone edges were then smoothed with a power rasp.   [employer] elevators were then used to perform a plantar capsular release.  The remaining portion of the first metatarsal head was inspected and there was approximately 30% of the articular surface denuded along the most dorsal lateral aspect.  A 0.045 inch Kirschner wire was then used to drill the subchondral tissue to promote fibrocartilaginous growth. Wound was irrigated with copious amounts of sterile irrigant.  The limitation on the first metatarsophalangeal joint range of motion noted preoperatively was improved.  The periosteal and capsular structure reapproximated using 4-0 Vicryl, subcutaneous structure reapproximated using 4-0 Vicryl, and skin was reapproximated using 5-0 Monocryl in a running subcuticular manner.  Incision was reinforced with Steri-Strips. A sterile compressive dressing was then applied to the left foot.  The pneumatic ankle tourniquet deflated and a prompt hyperemic response was noted to all digits of the left foot.          ______________________________ B. Theola Sequin, MD     BIM/MEDQ  D:  01/23/2011  T:  01/23/2011  Job:  [id number]  Electronically Signed by Rexford Maus  on 03/26/2011 01:47:42 PM

## 2011-03-28 NOTE — Telephone Encounter (Signed)
Just got back after week of nigths. Look at [employer] is a [employer] fast streamer. I will get back to him early next weeks. My apologies. Pls reply back to me so I have it as a remindder

## 2011-03-28 NOTE — Telephone Encounter (Signed)
Advised pt we will get back to him next week and he states that was fine. Will forward back to MR as a reminder Please advise. Thanks  Carver Fila, CMA

## 2011-03-31 NOTE — Telephone Encounter (Signed)
Methacholine positive for asthma. Please give him appt to see me < 1 week or see TP to start asthma mdi

## 2011-03-31 NOTE — Telephone Encounter (Signed)
Pt aware of results and verbalized understanding. Pt is coming in tomorrow at 1:45 to see MR to start asthma medication

## 2011-04-01 ENCOUNTER — Ambulatory Visit (INDEPENDENT_AMBULATORY_CARE_PROVIDER_SITE_OTHER): Payer: Managed Care, Other (non HMO) | Admitting: Internal Medicine

## 2011-04-01 ENCOUNTER — Encounter: Payer: Self-pay | Admitting: Internal Medicine

## 2011-04-01 DIAGNOSIS — R0989 Other specified symptoms and signs involving the circulatory and respiratory systems: Secondary | ICD-10-CM

## 2011-04-01 DIAGNOSIS — F17200 Nicotine dependence, unspecified, uncomplicated: Secondary | ICD-10-CM

## 2011-04-01 DIAGNOSIS — J45909 Unspecified asthma, uncomplicated: Secondary | ICD-10-CM

## 2011-04-01 DIAGNOSIS — R0609 Other forms of dyspnea: Secondary | ICD-10-CM

## 2011-04-01 DIAGNOSIS — R0600 Dyspnea, unspecified: Secondary | ICD-10-CM

## 2011-04-01 MED ORDER — FLUTICASONE-SALMETEROL 250-50 MCG/DOSE IN AEPB: 1.0000 | INHALATION_SPRAY | Freq: Two times a day (BID) | RESPIRATORY_TRACT | Status: DC

## 2011-04-01 NOTE — Assessment & Plan Note (Signed)
Normal pfts. Methacholine challenge 03/05/2011 positive for asthma  PLAN Continue advair 250/50 - sample given, technique reinforced Recruit for AUSTRI safety trial when up and running Reasssess residual dyspnea after pulmonary rehab

## 2011-04-01 NOTE — Patient Instructions (Addendum)
#  Asthma and shortness of breath You have asthma But you are short of breath from something else as well, likely being unfit Recommend continue advair 250/50 1 puff bid; take sample and discount card and show technique Join pulmonary rehab -referral made #smoking  please work on quitting #followup  3 months

## 2011-04-01 NOTE — Progress Notes (Signed)
Subjective:    Patient ID: Aaron [name], male    DOB: Mar 17, 1956, 55 y.o.   MRN: [id number]  HPI April 2348: 55 year old male, 38 pack smoker, ex-etoh (quit 2011) states PMD unsure if he has copd or not (pmd records not available currently) and therefore here. States that he has chronic dyspnea of insidious onset for a few years. Brought on by moderate exertion but relieved by rest. Activities such as raking leaves bring on dyspnea. Dust, pollen, weather change do not provoke dyspnea. Dyspnea does not bother his lifestyle and he does not think it is a big issue. He has been on advair since 2008 which he thinks might be helping. Dyspnea associated with mild "smoker's cough" for years that is associtaed with mild brown sputum. Denies associated chest pain, edema, hemoptysis, weight loss. Of note used to be seen by Dr Shaune Pollack in the past. Debera Lat dictation in 2007 states normal PFts in 2007. CXR 2011 did not show hyperfinlation and this is why patient does not think he has copd. Hx of normal cardiac stress test in 2006. Had PFTs today after being off advair for several weeks. The PFTs are entirely normal (personally reviewd trace). REC: Methacholine challenge test off  advair  Ov 04/01/2011: Followup dyspnea, smoking. Underwent methacholine challenge test 03/05/2011: POSITIVE FOR ASTHMA (PC 20 <4). He felt dyspnea on exertion deteriorated after coming off advair and has improved since being back on advair past 1-2 weeks. Still with baseline dyspnea on exertion that is relieved by rest. STill smoking. States he is only half ready to quit smoking. No new complaints    Review of Systems  Constitutional: Negative for fever, chills, activity change, appetite change and unexpected weight change.  HENT: Negative for congestion, sore throat, rhinorrhea, sneezing, trouble swallowing, dental problem, voice change and postnasal drip.   Eyes: Negative for visual disturbance.  Respiratory: Positive for cough and  shortness of breath. Negative for choking.   Cardiovascular: Negative for chest pain and leg swelling.  Gastrointestinal: Negative for nausea, vomiting and abdominal pain.  Genitourinary: Negative for difficulty urinating.  Musculoskeletal: Negative for arthralgias.  Skin: Negative for rash.  Psychiatric/Behavioral: Negative for behavioral problems and confusion.       Objective:   Physical Exam  Nursing note and vitals reviewed. Constitutional: He is oriented to person, place, and time. He appears well-developed and well-nourished. No distress.  HENT:  Head: Normocephalic and atraumatic.  Right Ear: External ear normal.  Left Ear: External ear normal.  Mouth/Throat: Oropharynx is clear and moist. No oropharyngeal exudate.  Eyes: Conjunctivae and EOM are normal. Pupils are equal, round, and reactive to light. Right eye exhibits no discharge. Left eye exhibits no discharge. No scleral icterus.  Neck: Normal range of motion. Neck supple. No JVD present. No tracheal deviation present. No thyromegaly present.  Cardiovascular: Normal rate, regular rhythm and intact distal pulses.  Exam reveals no gallop and no friction rub.   No murmur heard. Pulmonary/Chest: Effort normal and breath sounds normal. No respiratory distress. He has no wheezes. He has no rales. He exhibits no tenderness.  Abdominal: Soft. Bowel sounds are normal. He exhibits no distension and no mass. There is no tenderness. There is no rebound and no guarding.  Musculoskeletal: Normal range of motion. He exhibits no tenderness.  Lymphadenopathy:    He has no cervical adenopathy.  Neurological: He is alert and oriented to person, place, and time. He has normal reflexes. No cranial nerve deficit. Coordination normal.  Skin: Skin is warm and dry. No rash noted. He is not diaphoretic. No erythema. No pallor.  Psychiatric: He has a normal mood and affect. His behavior is normal. Judgment and thought content normal.            Assessment & Plan:

## 2011-04-01 NOTE — Assessment & Plan Note (Signed)
7 minute face to face discusison. He likes chantix but says he is not fully ready to quit. Has quit drugs and alcohol but says he is not yet ready to submit to quit smoking. But says when ready he will like chantix called in. Agreed with plan

## 2011-04-01 NOTE — Assessment & Plan Note (Signed)
Suspect asosciated deconditioning aling with asthma as cause for dyspnea on exertion  PLAN Continue advair Pulmonary rehab referral - he is willing If dyspnea stil present after rehab, refer cards (he prefers this step wise approach as oppoised to carrds workup first)

## 2011-04-04 ENCOUNTER — Encounter: Payer: Self-pay | Admitting: Internal Medicine

## 2011-04-11 NOTE — Procedures (Signed)
NAME:  Aaron [name], Aaron [name] NO.:  [id number]   MEDICAL RECORD NO.:  [id number]          PATIENT TYPE:  REC   LOCATION:  RAD                           FACILITY:  [facility]   PHYSICIAN:  Carrollwood Bing, M.D. [facility] OF BIRTH:  [birth year]   DATE OF PROCEDURE:  10/15/2005  DATE OF DISCHARGE:                                  ECHOCARDIOGRAM   CLINICAL DATA:  A 55 year old gentleman with chest pain.   M-MODE:  Aorta 3.2, left atrium 4.3, septum 1.5, posterior wall 1.4, LV  diastole 4.7, LV systole 2.7.   CONCLUSION:  1.  Technically adequate echocardiographic study.  2.  Normal left atrium, right atrium and right ventricle.  3.  Normal trileaflet aortic valve; minimal annular calcification; very mild      regurgitation.  4.  Delicate mitral valve; very mild annular calcification.  5.  Normal tricuspid valve; trivial regurgitation.  6.  Normal pulmonic valve and proximal pulmonary artery.  7.  Normal left ventricular size; mild concentric hypertrophy; normal      regional and global function.  8.  Normal inferior vena cava.      [place]amakee Bing, M.D. [facility]  Electronically Signed     RR/MEDQ  D:  10/15/2005  T:  10/15/2005  Job:  [id number]

## 2011-04-11 NOTE — Procedures (Signed)
NAME:  KOJO, LIBY NO.:  [id number]   MEDICAL RECORD NO.:  [id number]          PATIENT TYPE:  REC   LOCATION:  RAD                           FACILITY:  [facility]   PHYSICIAN:  Vida Roller, M.D.   DATE OF BIRTH:  Feb 04, 1956   DATE OF PROCEDURE:  10/15/2005  DATE OF DISCHARGE:                                    STRESS TEST   CARDIOLOGIST:  Vida Roller, M.D.   PROCEDURE PERFORMED:  Exercise Myoview.   Mr. Rothert is a 55 year old gentleman with no known coronary artery disease.  However, his risk factors including positive tobacco abuse and positive  family history. The patient had presented with fatigue and an abnormal EKG.   BASELINE DATA:  His EKG revealed a normal sinus rhythm with a rate of 62 per  minute with some nonspecific ST abnormalities. His blood pressure was  130/80.   DESCRIPTION OF PROCEDURE:  The patient exercised for a total of 11 minutes  and 6 seconds into the Bruce protocol stage 4 and 12.8 METS. Maximum heart  rate achieved was 157. Maximum blood pressure was 160/90 which went back to  122/80 in recovery. The patient complained of some shortness of breath  during exercise which resolved after completion of the test. EKG revealed  occasional PVCs, some ST depression which resolved in recovery after  approximately 4 minutes. Final images are pending M.D. review.     ______________________________  April Humphrey, NP      Vida Roller, M.D.  Electronically Signed    AH/MEDQ  D:  10/15/2005  T:  10/15/2005  Job:  [id number]

## 2011-04-11 NOTE — Procedures (Signed)
NAME:  SARP, VERNIER NO.:  [id number]   MEDICAL RECORD NO.:  [id number]          PATIENT TYPE:  OUT   LOCATION:  RESP                          FACILITY:  [facility]   PHYSICIAN:  Edward L. Juanetta Gosling, M.D.DATE OF BIRTH:  [birth year]   DATE OF PROCEDURE:  12/18/2005  DATE OF DISCHARGE:                              PULMONARY FUNCTION TEST   RESULTS:  1.  Spirometry shows no definite ventilatory defect, but evidence of airflow      obstruction most marked in the smaller airways.  2.  Lung volumes are normal.  3.  Diffusion capacity of carbon monoxide (DLCO) is normal.  4.  Arterial blood gases are normal.      Edward L. Juanetta Gosling, M.D.  Electronically Signed     ELH/MEDQ  D:  12/25/2005  T:  12/26/2005  Job:  [id number]

## 2011-04-11 NOTE — Procedures (Signed)
   NAME:  Aaron [name], DUELL NO.:  [id number]   MEDICAL RECORD NO.:  [id number]                   PATIENT TYPE:  OUT   LOCATION:  DFTL                                 FACILITY:  [facility]   PHYSICIAN:  Edward L. Juanetta Gosling, M.D.             DATE OF BIRTH:  04-Feb-1956   DATE OF PROCEDURE:  07/18/2003  DATE OF DISCHARGE:                                    STRESS TEST   INDICATIONS FOR PROCEDURE:  Mr. Henkels is undergoing graded exercise testing  because he had an episode of chest pain, came to the emergency room, and was  found to have an elevated troponin but otherwise normal workup.  There are  no contraindications to graded exercise testing.   Mr. Kampf exercised for nine minutes on the Bruce protocol, reaching and  sustaining 10.1 METs.  His maximum recorded heart rate was 155 which is 88%  of his age-predicted maximal heart rate.  He had an occasional PVC.  His  blood pressure response to exercise was normal.  He had no  electrocardiographic changes suggestive of inducible ischemic, and he had no  chest pain during exercise.   IMPRESSION:  1. Good exercise tolerance.  2. Normal blood pressure response to exercise.  3. No evidence of inducible ischemic.  4. No symptoms during exercise.                                               Edward L. Juanetta Gosling, M.D.    ELH/MEDQ  D:  07/18/2003  T:  07/18/2003  Job:  [id number]

## 2011-04-28 ENCOUNTER — Telehealth: Payer: Self-pay | Admitting: Internal Medicine

## 2011-04-28 MED ORDER — FLUTICASONE-SALMETEROL 250-50 MCG/DOSE IN AEPB: 1.0000 | INHALATION_SPRAY | Freq: Two times a day (BID) | RESPIRATORY_TRACT | Status: DC

## 2011-04-28 NOTE — Telephone Encounter (Signed)
Also he is almost out and wanted to know if we had any samples available until his comes through the mail. He will be in [facility] Wednesday 6/6 would like to pick up then if available.Roswell [name]

## 2011-04-28 NOTE — Telephone Encounter (Signed)
Called and spoke with pt.  Pt states he last saw MR on 04/01/11 and was given 30 day rx.  Pt states he needs this changed to 90 day supply per insurance.Marland Kitchen  Rx printed and given to Victorino Dike to have MR sign tomorrow when he is in office.  Pt also requested samples of Advair to last until his mail order arrived.  1 box of advair 250/50 left at front desk for pt to pick up.

## 2011-04-29 ENCOUNTER — Ambulatory Visit (INDEPENDENT_AMBULATORY_CARE_PROVIDER_SITE_OTHER): Payer: Managed Care, Other (non HMO) | Admitting: Urology

## 2011-04-29 DIAGNOSIS — N529 Male erectile dysfunction, unspecified: Secondary | ICD-10-CM

## 2011-04-29 MED ORDER — FLUTICASONE-SALMETEROL 250-50 MCG/DOSE IN AEPB: 1.0000 | INHALATION_SPRAY | Freq: Two times a day (BID) | RESPIRATORY_TRACT | Status: DC

## 2011-04-29 NOTE — Telephone Encounter (Signed)
Give him samples Give him 90 day supply order Thanks MR

## 2011-04-29 NOTE — Telephone Encounter (Signed)
Called spoke with patient, advised that MR okayed the samples.  Will print off 90day supply of the advair and have MR sign and fax to [employer] per pt's request.  Pt aware rx will be signed tomorrow.  Pt ID # and dob written on rx.  Given to Morrison to have MR sign.

## 2011-04-30 NOTE — Telephone Encounter (Signed)
rx faxed. Dorothy Landgrebe, CMA  

## 2011-05-20 ENCOUNTER — Encounter (HOSPITAL_COMMUNITY): Payer: Managed Care, Other (non HMO) | Attending: Internal Medicine

## 2011-05-20 DIAGNOSIS — J4489 Other specified chronic obstructive pulmonary disease: Secondary | ICD-10-CM | POA: Insufficient documentation

## 2011-05-20 DIAGNOSIS — Z5189 Encounter for other specified aftercare: Secondary | ICD-10-CM | POA: Insufficient documentation

## 2011-05-20 DIAGNOSIS — F17200 Nicotine dependence, unspecified, uncomplicated: Secondary | ICD-10-CM | POA: Insufficient documentation

## 2011-05-20 DIAGNOSIS — E785 Hyperlipidemia, unspecified: Secondary | ICD-10-CM | POA: Insufficient documentation

## 2011-05-20 DIAGNOSIS — J449 Chronic obstructive pulmonary disease, unspecified: Secondary | ICD-10-CM | POA: Insufficient documentation

## 2011-05-20 DIAGNOSIS — I10 Essential (primary) hypertension: Secondary | ICD-10-CM | POA: Insufficient documentation

## 2011-05-27 ENCOUNTER — Encounter (HOSPITAL_COMMUNITY)
Admission: RE | Admit: 2011-05-27 | Discharge: 2011-05-27 | Disposition: A | Discharge status: Home / Self Care | Payer: Managed Care, Other (non HMO) | Source: Ambulatory Visit | Attending: Internal Medicine | Admitting: Internal Medicine

## 2011-05-27 DIAGNOSIS — J449 Chronic obstructive pulmonary disease, unspecified: Secondary | ICD-10-CM | POA: Insufficient documentation

## 2011-05-27 DIAGNOSIS — E785 Hyperlipidemia, unspecified: Secondary | ICD-10-CM | POA: Insufficient documentation

## 2011-05-27 DIAGNOSIS — F17200 Nicotine dependence, unspecified, uncomplicated: Secondary | ICD-10-CM | POA: Insufficient documentation

## 2011-05-27 DIAGNOSIS — I10 Essential (primary) hypertension: Secondary | ICD-10-CM | POA: Insufficient documentation

## 2011-05-27 DIAGNOSIS — J4489 Other specified chronic obstructive pulmonary disease: Secondary | ICD-10-CM | POA: Insufficient documentation

## 2011-05-27 DIAGNOSIS — Z5189 Encounter for other specified aftercare: Secondary | ICD-10-CM | POA: Insufficient documentation

## 2011-05-29 ENCOUNTER — Encounter (HOSPITAL_COMMUNITY): Payer: Managed Care, Other (non HMO)

## 2011-06-03 ENCOUNTER — Encounter (HOSPITAL_COMMUNITY): Payer: Managed Care, Other (non HMO)

## 2011-06-05 ENCOUNTER — Encounter (HOSPITAL_COMMUNITY)
Admission: RE | Admit: 2011-06-05 | Discharge: 2011-06-05 | Payer: Managed Care, Other (non HMO) | Source: Ambulatory Visit | Attending: Internal Medicine | Admitting: Internal Medicine

## 2011-06-10 ENCOUNTER — Encounter (HOSPITAL_COMMUNITY): Payer: Managed Care, Other (non HMO)

## 2011-06-12 ENCOUNTER — Encounter (HOSPITAL_COMMUNITY): Payer: Managed Care, Other (non HMO)

## 2011-06-17 ENCOUNTER — Encounter (HOSPITAL_COMMUNITY): Payer: Managed Care, Other (non HMO)

## 2011-06-19 ENCOUNTER — Encounter (HOSPITAL_COMMUNITY): Payer: Managed Care, Other (non HMO)

## 2011-06-24 ENCOUNTER — Encounter (HOSPITAL_COMMUNITY): Payer: Managed Care, Other (non HMO)

## 2011-06-26 ENCOUNTER — Encounter (HOSPITAL_COMMUNITY): Payer: Managed Care, Other (non HMO) | Attending: Internal Medicine

## 2011-06-26 DIAGNOSIS — E785 Hyperlipidemia, unspecified: Secondary | ICD-10-CM | POA: Insufficient documentation

## 2011-06-26 DIAGNOSIS — F17200 Nicotine dependence, unspecified, uncomplicated: Secondary | ICD-10-CM | POA: Insufficient documentation

## 2011-06-26 DIAGNOSIS — I10 Essential (primary) hypertension: Secondary | ICD-10-CM | POA: Insufficient documentation

## 2011-06-26 DIAGNOSIS — J449 Chronic obstructive pulmonary disease, unspecified: Secondary | ICD-10-CM | POA: Insufficient documentation

## 2011-06-26 DIAGNOSIS — J4489 Other specified chronic obstructive pulmonary disease: Secondary | ICD-10-CM | POA: Insufficient documentation

## 2011-06-26 DIAGNOSIS — Z5189 Encounter for other specified aftercare: Secondary | ICD-10-CM | POA: Insufficient documentation

## 2011-06-30 ENCOUNTER — Ambulatory Visit (INDEPENDENT_AMBULATORY_CARE_PROVIDER_SITE_OTHER): Payer: Managed Care, Other (non HMO) | Admitting: Internal Medicine

## 2011-06-30 ENCOUNTER — Encounter: Payer: Self-pay | Admitting: Internal Medicine

## 2011-06-30 VITALS — BP 132/80 | HR 97 | Temp 97.6°F | Ht 72.0 in | Wt 219.2 lb

## 2011-06-30 DIAGNOSIS — Z23 Encounter for immunization: Secondary | ICD-10-CM

## 2011-06-30 DIAGNOSIS — F17200 Nicotine dependence, unspecified, uncomplicated: Secondary | ICD-10-CM

## 2011-06-30 DIAGNOSIS — J4540 Moderate persistent asthma, uncomplicated: Secondary | ICD-10-CM

## 2011-06-30 DIAGNOSIS — J45909 Unspecified asthma, uncomplicated: Secondary | ICD-10-CM

## 2011-06-30 DIAGNOSIS — G4700 Insomnia, unspecified: Secondary | ICD-10-CM

## 2011-06-30 NOTE — Assessment & Plan Note (Signed)
#  Insomnia or sleeplessness  - this might be a sleep hygiene issue - do not drink coffee or cola after 2pm  - go to bed at same time each night  - give yourself 8 hours of sleep time  - as soon as you go to bed, turn off all TV, music, reading, lights  - sleep in dark - as soon as you wake up each morning get 20-30 minutes of sunlight - do not exercise at night - go to bed only 2-3 hours after your last meal  #Followup - 6 months or sooner if needed

## 2011-06-30 NOTE — Progress Notes (Signed)
Subjective:    Patient ID: Aaron [name], male    DOB: Sep 14, 1956, 55 y.o.   MRN: [id number]  HPI  April 4666: 55 year old male, 68 pack smoker, ex-etoh (quit 2011) states PMD unsure if he has copd or not (pmd records not available currently) and therefore here. States that he has chronic dyspnea of insidious onset for a few years. Brought on by moderate exertion but relieved by rest. Activities such as raking leaves bring on dyspnea. Dust, pollen, weather change do not provoke dyspnea. Dyspnea does not bother his lifestyle and he does not think it is a big issue. He has been on advair since 2008 which he thinks might be helping. Dyspnea associated with mild "smoker's cough" for years that is associtaed with mild brown sputum. Denies associated chest pain, edema, hemoptysis, weight loss. Of note used to be seen by Dr Shaune Pollack in the past. Debera Lat dictation in 2007 states normal PFts in 2007. CXR 2011 did not show hyperfinlation and this is why patient does not think he has copd. Hx of normal cardiac stress test in 2006. Had PFTs today after being off advair for several weeks. The PFTs are entirely normal (personally reviewd trace). REC: Methacholine challenge test off  advair  Ov 04/01/2011: Followup dyspnea, smoking. Underwent methacholine challenge test 03/05/2011: POSITIVE FOR ASTHMA (PC 20 <4). He felt dyspnea on exertion deteriorated after coming off advair and has improved since being back on advair past 1-2 weeks. Still with baseline dyspnea on exertion that is relieved by rest. STill smoking. States he is only half ready to quit smoking. No new complaints:REC ADVAIR 250/50, REHAB, QUIT SMOKING  OV 06/30/2011:  Followup asthma and smoking.  Regarding Asthma: very well controlled on advair 250/50. No dyspnea, wheeze, chest tightness. Largely asymptomatic. No rescue use of albuterol since last visit. Attending rehab and feels this is helping a lot. Denies associated symptoms of any sort. Rates  severity as asymptomatic. Regarding smoking: feels not ready to quit. Understands need to but just not ready. Reading a book on quitting though. Loved chantix in past. Has new complaint of INSOMNIA: chronic issues many years. PMD not willing to give him xanax. No clear cut aggravating factors. Benzos help. Rates severity as mild. Associated poor sleep hygiene present (coffee, colas, watches tv in bed, noise etc.,). Never tried melatonin. No worsening and stable since onset  Past Medical History  Diagnosis Date  . Hyperlipidemia   . HTN (hypertension)   . Gout   . Rash 2011    left chest, biopsy 2012 Dr. Charlton Haws, results pending  . Dyspnea     normal PFT April 2012  . Tobacco abuse      Family History  Problem Relation Age of Onset  . Heart attack Father 33  . COPD Father   . Asthma Daughter      History   Social History  . Marital Status: Married    Spouse Name: N/A    Number of Children: N/A  . Years of Education: N/A   Occupational History  . CNA [employer]   Social History Main Topics  . Smoking status: Current Everyday Smoker -- 1.5 packs/day for 52.5 years    Types: Cigarettes  . Smokeless tobacco: Not on file   Comment: started in 1977  . Alcohol Use: No     past ETOH use quit 11-26-2009  . Drug Use: Not on file  . Sexually Active: Not on file   Other Topics Concern  .  Not on file   Social History Narrative  . No narrative on file     No Known Allergies   Outpatient Prescriptions Prior to Visit  Medication Sig Dispense Refill  . allopurinol (ZYLOPRIM) 100 MG tablet Take 100 mg by mouth 2 (two) times daily.       . pantoprazole (PROTONIX) 40 MG tablet Take 40 mg by mouth 2 (two) times daily.        . simvastatin (ZOCOR) 10 MG tablet Take 10 mg by mouth every other day.       . Fluticasone-Salmeterol (ADVAIR DISKUS) 250-50 MCG/DOSE AEPB Inhale 1 puff into the lungs every 12 (twelve) hours.  180 each  3        Review of Systems  Constitutional:  Negative for fever and unexpected weight change.  HENT: Negative for ear pain, nosebleeds, congestion, sore throat, rhinorrhea, sneezing, trouble swallowing, dental problem, postnasal drip and sinus pressure.   Eyes: Negative for redness and itching.  Respiratory: Negative for cough, chest tightness, shortness of breath and wheezing.   Cardiovascular: Negative for palpitations and leg swelling.  Gastrointestinal: Negative for nausea and vomiting.  Genitourinary: Negative for dysuria.  Musculoskeletal: Negative for joint swelling.  Skin: Negative for rash.  Neurological: Negative for headaches.  Hematological: Does not bruise/bleed easily.  Psychiatric/Behavioral: Negative for dysphoric mood. The patient is not nervous/anxious.        Objective:   Physical Exam  Nursing note and vitals reviewed. Constitutional: He is oriented to person, place, and time. He appears well-developed and well-nourished. No distress.  HENT:  Head: Normocephalic and atraumatic.  Right Ear: External ear normal.  Left Ear: External ear normal.  Mouth/Throat: Oropharynx is clear and moist. No oropharyngeal exudate.       Smells of tobacco +  Eyes: Conjunctivae and EOM are normal. Pupils are equal, round, and reactive to light. Right eye exhibits no discharge. Left eye exhibits no discharge. No scleral icterus.  Neck: Normal range of motion. Neck supple. No JVD present. No tracheal deviation present. No thyromegaly present.  Cardiovascular: Normal rate, regular rhythm and intact distal pulses.  Exam reveals no gallop and no friction rub.   No murmur heard. Pulmonary/Chest: Effort normal and breath sounds normal. No respiratory distress. He has no wheezes. He has no rales. He exhibits no tenderness.  Abdominal: Soft. Bowel sounds are normal. He exhibits no distension and no mass. There is no tenderness. There is no rebound and no guarding.  Musculoskeletal: Normal range of motion. He exhibits no edema and no  tenderness.  Lymphadenopathy:    He has no cervical adenopathy.  Neurological: He is alert and oriented to person, place, and time. He has normal reflexes. No cranial nerve deficit. Coordination normal.  Skin: Skin is warm and dry. No rash noted. He is not diaphoretic. No erythema. No pallor.  Psychiatric: He has a normal mood and affect. His behavior is normal. Judgment and thought content normal.          Assessment & Plan:

## 2011-06-30 NOTE — Patient Instructions (Addendum)
#  ASthma   - appears under very good control  - continue advair 250/50 1 puff twice daily without fail - use albuterol 2 puff as needed - pneumovax shot today (Risks, benefits, limitations discussed) #smoking  - please read book and think about qutting  - we agree you are not yet ready to quit #Insomnia or sleeplessness  - this might be a sleep hygiene issue - do not drink coffee or cola after 2pm  - go to bed at same time each night  - give yourself 8 hours of sleep time  - as soon as you go to bed, turn off all TV, music, reading, lights  - sleep in dark - as soon as you wake up each morning get 20-30 minutes of sunlight - do not exercise at night - go to bed only 2-3 hours after your last meal  #Followup - 6 months or sooner if needed

## 2011-06-30 NOTE — Assessment & Plan Note (Signed)
5 min counseling to quit. He knows need to quit but is not ready. Is reading  abook given to him at rehab. Discussed chantix again in detail. He likes drug and it helped him in past but is not ready to quitnow. Continue ongoing discussion

## 2011-06-30 NOTE — Assessment & Plan Note (Signed)
#  ASthma   - appears under very good control  - continue advair 250/50 1 puff twice daily without fail - use albuterol 2 puff as needed - pneumovax shot today (Risks, benefits, limitations discussed)

## 2011-07-01 ENCOUNTER — Encounter (HOSPITAL_COMMUNITY): Payer: Managed Care, Other (non HMO)

## 2011-07-02 ENCOUNTER — Telehealth: Payer: Self-pay | Admitting: Internal Medicine

## 2011-07-02 NOTE — Telephone Encounter (Signed)
Who gave vaccine ? There is a rare hypersensitivity reaction that can happen - see it once a year or so. I warned him about it. He can try some ibuprofen 400mg  tid prn if unbearable. Otherwise would advice watch it. If not gone away in few days or getting worse anytime call us again. Can he move his arm now ?

## 2011-07-02 NOTE — Telephone Encounter (Signed)
Spoke with pt. He states that he received pneumovax at visit on 06/30/11- was "unable to move arm" until today, area is red and "puffy"- states that it looks and feels better since he called earlier, but area of site of inj is still red and a little warm to touch. MR, pls advise recs thanks!

## 2011-07-02 NOTE — Telephone Encounter (Signed)
I'm not sure who gave vaccine. Spoke with pt and advised him of MR advise. He verbalized understanding and states he can move his arm. Pt is aware to call if the site gets worse or does not improve in a few days. Pt verbalized understanding.

## 2011-07-03 ENCOUNTER — Encounter (HOSPITAL_COMMUNITY): Payer: Managed Care, Other (non HMO)

## 2011-07-08 ENCOUNTER — Encounter (HOSPITAL_COMMUNITY): Payer: Managed Care, Other (non HMO)

## 2011-07-10 ENCOUNTER — Encounter (HOSPITAL_COMMUNITY): Payer: Managed Care, Other (non HMO)

## 2011-07-15 ENCOUNTER — Encounter (HOSPITAL_COMMUNITY): Payer: Managed Care, Other (non HMO)

## 2011-07-17 ENCOUNTER — Encounter (HOSPITAL_COMMUNITY): Payer: Managed Care, Other (non HMO)

## 2011-07-22 ENCOUNTER — Encounter (HOSPITAL_COMMUNITY): Payer: Managed Care, Other (non HMO)

## 2011-07-24 ENCOUNTER — Encounter (HOSPITAL_COMMUNITY): Payer: Managed Care, Other (non HMO)

## 2011-07-29 ENCOUNTER — Encounter (HOSPITAL_COMMUNITY): Payer: Managed Care, Other (non HMO)

## 2011-07-31 ENCOUNTER — Encounter (HOSPITAL_COMMUNITY): Payer: Managed Care, Other (non HMO)

## 2011-08-01 ENCOUNTER — Ambulatory Visit: Payer: Managed Care, Other (non HMO) | Admitting: Internal Medicine

## 2011-08-05 ENCOUNTER — Encounter (HOSPITAL_COMMUNITY): Payer: Managed Care, Other (non HMO)

## 2011-08-07 ENCOUNTER — Encounter (HOSPITAL_COMMUNITY): Payer: Managed Care, Other (non HMO) | Attending: Internal Medicine

## 2011-08-07 DIAGNOSIS — E785 Hyperlipidemia, unspecified: Secondary | ICD-10-CM | POA: Insufficient documentation

## 2011-08-07 DIAGNOSIS — Z5189 Encounter for other specified aftercare: Secondary | ICD-10-CM | POA: Insufficient documentation

## 2011-08-07 DIAGNOSIS — J449 Chronic obstructive pulmonary disease, unspecified: Secondary | ICD-10-CM | POA: Insufficient documentation

## 2011-08-07 DIAGNOSIS — J4489 Other specified chronic obstructive pulmonary disease: Secondary | ICD-10-CM | POA: Insufficient documentation

## 2011-08-07 DIAGNOSIS — I10 Essential (primary) hypertension: Secondary | ICD-10-CM | POA: Insufficient documentation

## 2011-08-07 DIAGNOSIS — F17200 Nicotine dependence, unspecified, uncomplicated: Secondary | ICD-10-CM | POA: Insufficient documentation

## 2011-08-12 ENCOUNTER — Encounter (HOSPITAL_COMMUNITY): Payer: Managed Care, Other (non HMO)

## 2011-08-14 ENCOUNTER — Encounter (HOSPITAL_COMMUNITY): Payer: Managed Care, Other (non HMO)

## 2011-08-19 ENCOUNTER — Encounter (HOSPITAL_COMMUNITY): Payer: Managed Care, Other (non HMO)

## 2011-08-21 ENCOUNTER — Encounter (HOSPITAL_COMMUNITY): Payer: Managed Care, Other (non HMO)

## 2011-08-26 ENCOUNTER — Encounter (HOSPITAL_COMMUNITY): Payer: Managed Care, Other (non HMO)

## 2011-08-28 ENCOUNTER — Encounter (HOSPITAL_COMMUNITY): Payer: Managed Care, Other (non HMO)

## 2011-09-02 ENCOUNTER — Encounter (HOSPITAL_COMMUNITY): Payer: Managed Care, Other (non HMO)

## 2011-09-04 ENCOUNTER — Encounter (HOSPITAL_COMMUNITY): Payer: Managed Care, Other (non HMO)

## 2012-01-23 HISTORY — PX: TOE SURGERY: SHX1073

## 2012-04-29 ENCOUNTER — Other Ambulatory Visit: Payer: Self-pay | Admitting: Internal Medicine

## 2012-09-01 ENCOUNTER — Emergency Department (HOSPITAL_COMMUNITY): Payer: Managed Care, Other (non HMO)

## 2012-09-01 ENCOUNTER — Encounter (HOSPITAL_COMMUNITY): Payer: Self-pay | Admitting: Emergency Medicine

## 2012-09-01 ENCOUNTER — Emergency Department (HOSPITAL_COMMUNITY)
Admission: EM | Admit: 2012-09-01 | Discharge: 2012-09-01 | Disposition: A | Discharge status: Home / Self Care | Payer: Managed Care, Other (non HMO) | Attending: Emergency Medicine | Admitting: Emergency Medicine

## 2012-09-01 DIAGNOSIS — M545 Low back pain, unspecified: Secondary | ICD-10-CM | POA: Insufficient documentation

## 2012-09-01 DIAGNOSIS — I10 Essential (primary) hypertension: Secondary | ICD-10-CM | POA: Insufficient documentation

## 2012-09-01 DIAGNOSIS — Z79899 Other long term (current) drug therapy: Secondary | ICD-10-CM | POA: Insufficient documentation

## 2012-09-01 DIAGNOSIS — M109 Gout, unspecified: Secondary | ICD-10-CM | POA: Insufficient documentation

## 2012-09-01 DIAGNOSIS — E785 Hyperlipidemia, unspecified: Secondary | ICD-10-CM | POA: Insufficient documentation

## 2012-09-01 DIAGNOSIS — I714 Abdominal aortic aneurysm, without rupture, unspecified: Secondary | ICD-10-CM | POA: Insufficient documentation

## 2012-09-01 DIAGNOSIS — R350 Frequency of micturition: Secondary | ICD-10-CM | POA: Insufficient documentation

## 2012-09-01 DIAGNOSIS — J45909 Unspecified asthma, uncomplicated: Secondary | ICD-10-CM | POA: Insufficient documentation

## 2012-09-01 DIAGNOSIS — F17200 Nicotine dependence, unspecified, uncomplicated: Secondary | ICD-10-CM | POA: Insufficient documentation

## 2012-09-01 HISTORY — DX: Unspecified asthma, uncomplicated: J45.909

## 2012-09-01 LAB — URINALYSIS, ROUTINE W REFLEX MICROSCOPIC
Hgb urine dipstick: NEGATIVE
Protein, ur: NEGATIVE mg/dL
Specific Gravity, Urine: 1.01 (ref 1.005–1.030)
Urobilinogen, UA: 0.2 mg/dL (ref 0.0–1.0)

## 2012-09-01 IMAGING — CT CT ABD-PELV W/O CM
2 of 4 series · 16 of 46 positions shown, 18 images · non-contrast
Comparison: None.

CLINICAL DATA: Right flank and back pain.  Nephrolithiasis.

CT ABDOMEN AND PELVIS WITHOUT CONTRAST
TECHNIQUE: Multidetector CT imaging of the abdomen and pelvis was
performed following the standard protocol without intravenous
contrast.

[Series 2: standard/full over (age)lbs 5.0 · axial · 0.81mm/px · z∈[-542,-102]mm · 13 of 96 slices shown, 15 images]
[im 4/96  soft-tissue]
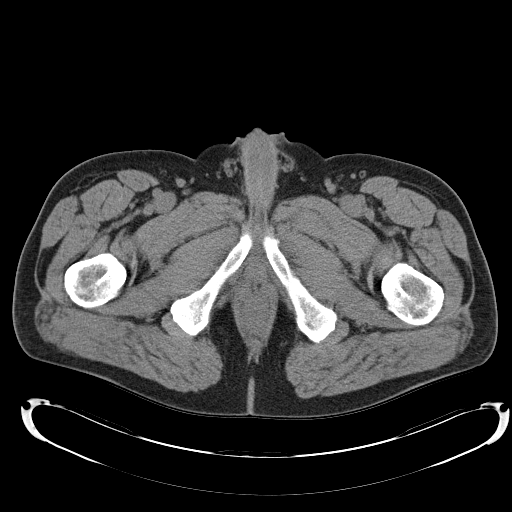
[im 4/96  bone]
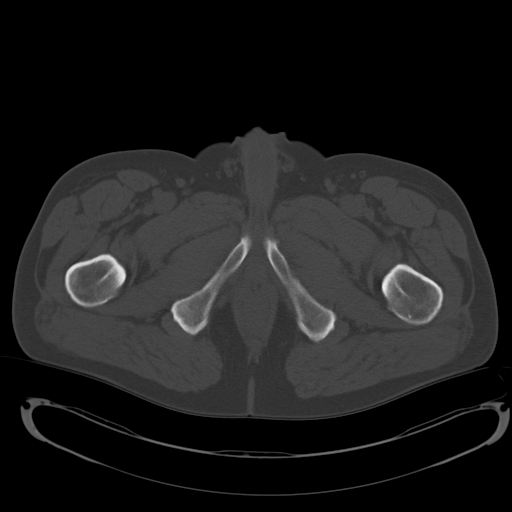
[im 11/96  soft-tissue]
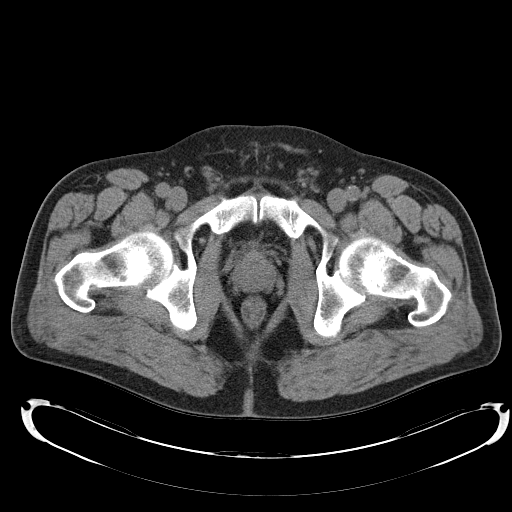
[im 19/96  soft-tissue]
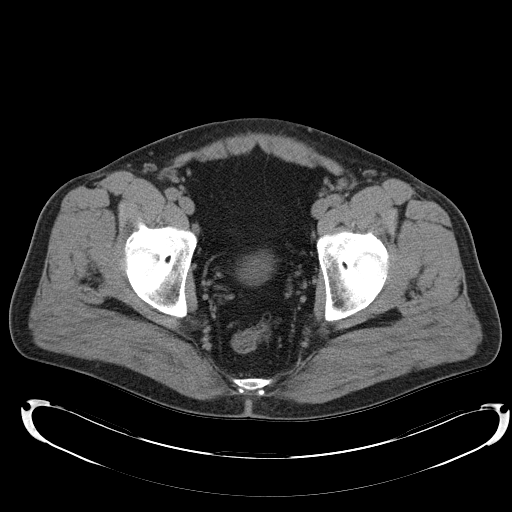
[im 26/96  soft-tissue]
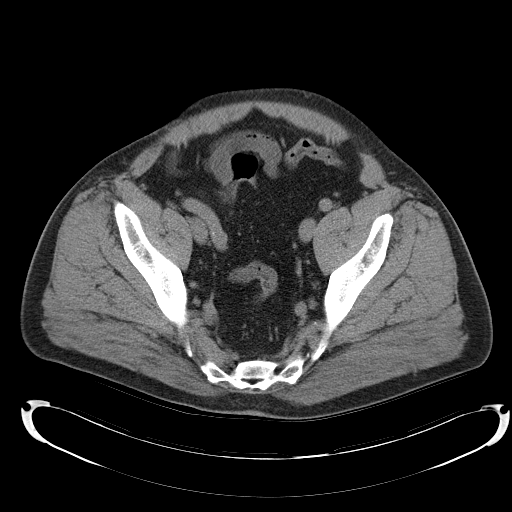
[im 33/96  soft-tissue]
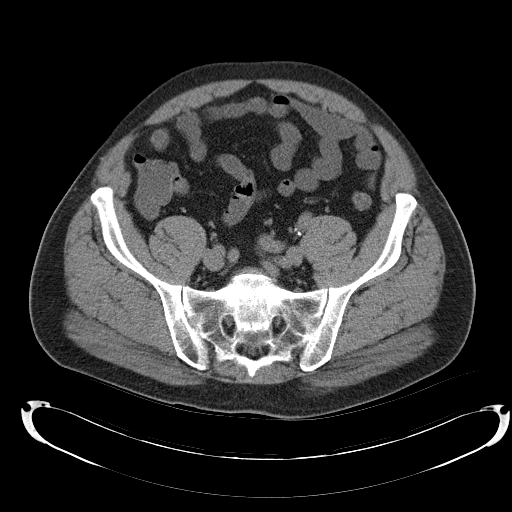
[im 41/96  soft-tissue]
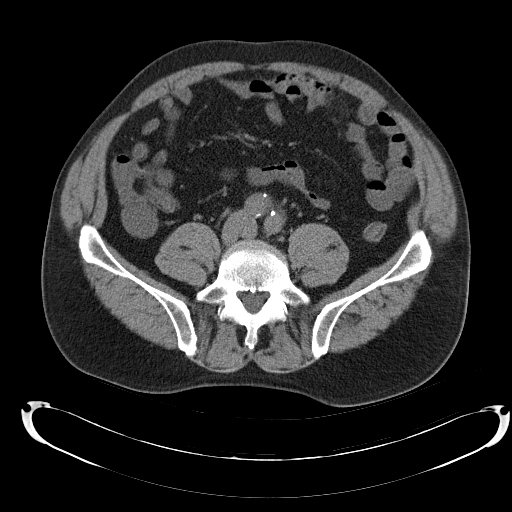
[im 48/96  soft-tissue]
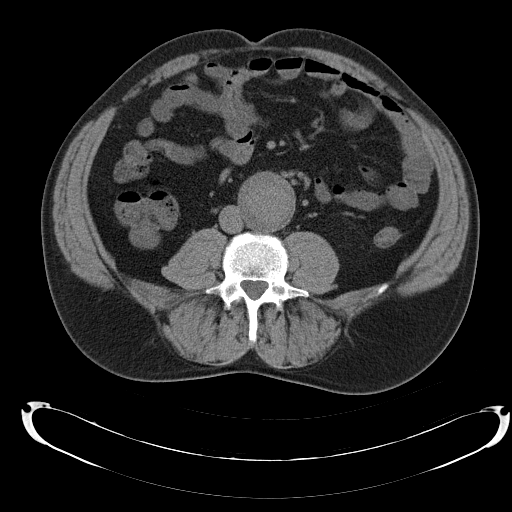
[im 55/96  soft-tissue]
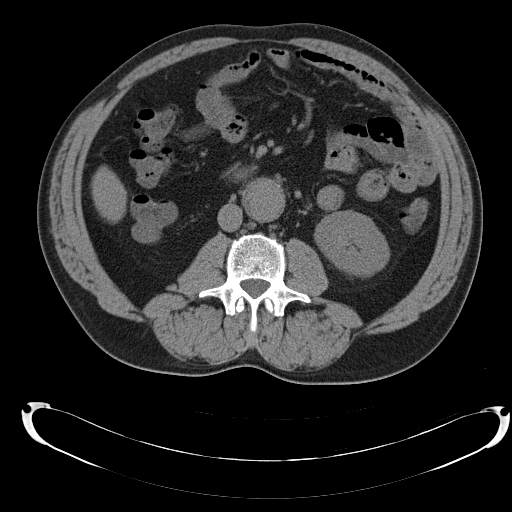
[im 63/96  soft-tissue]
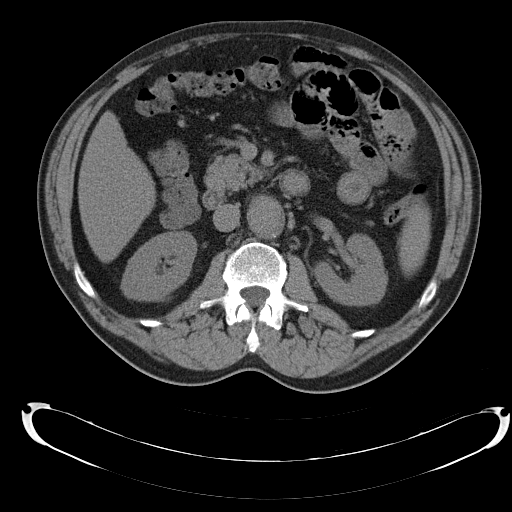
[im 63/96  bone]
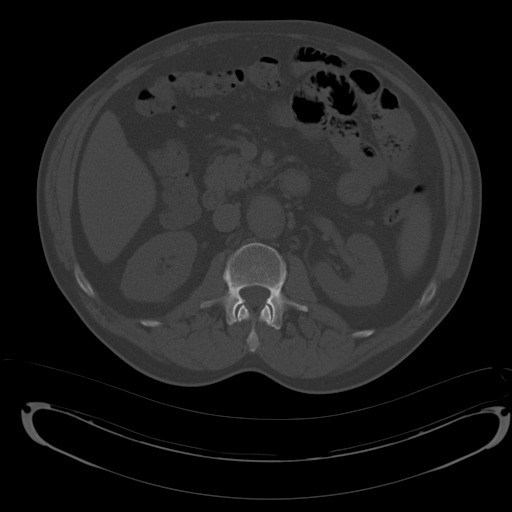
[im 70/96  soft-tissue]
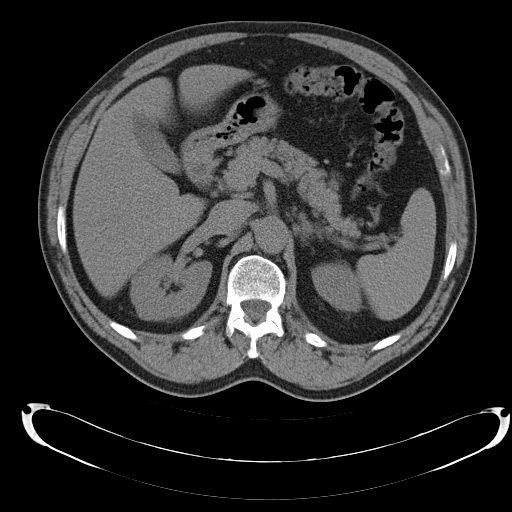
[im 77/96  soft-tissue]
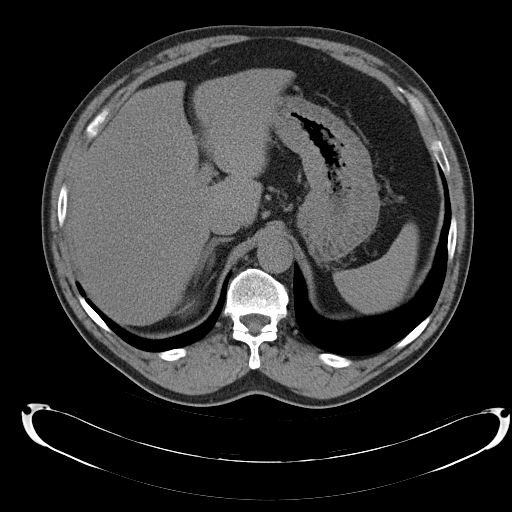
[im 85/96  soft-tissue]
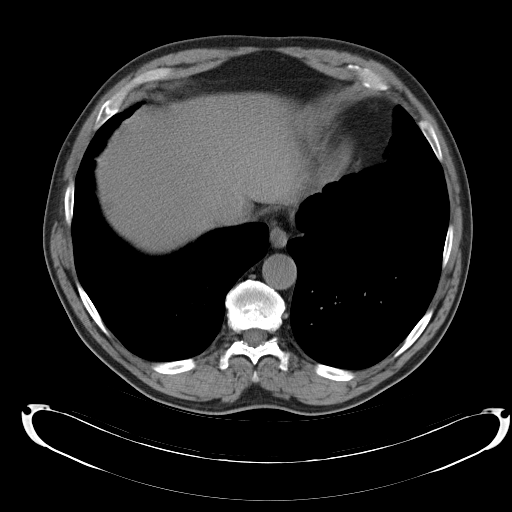
[im 92/96  soft-tissue]
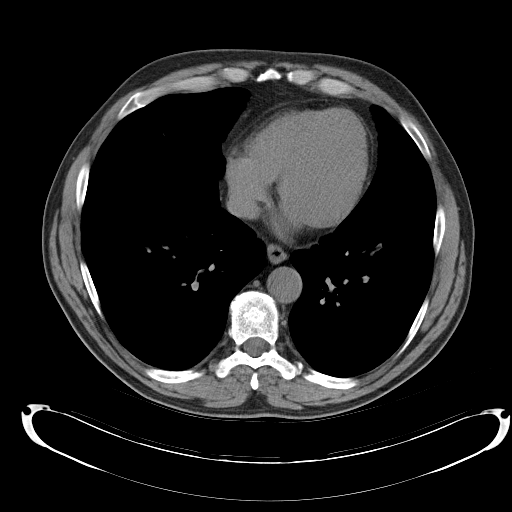

[Series 4: mpr coronal · coronal · 0.75mm/px · 3 of 100 slices shown]
[im 34/100  soft-tissue]
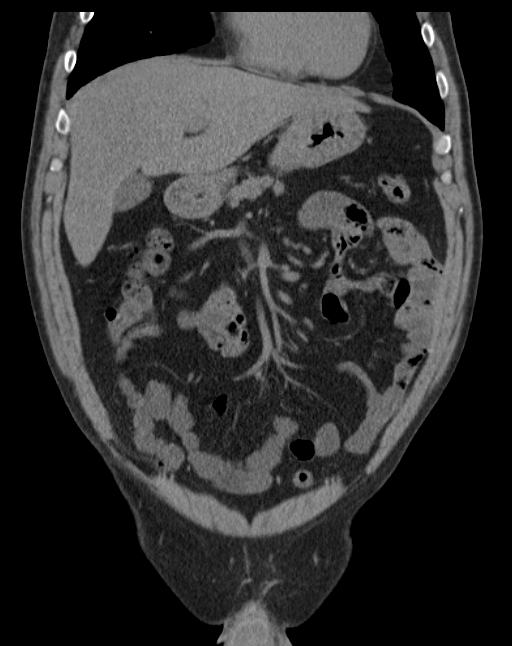
[im 45/100  soft-tissue]
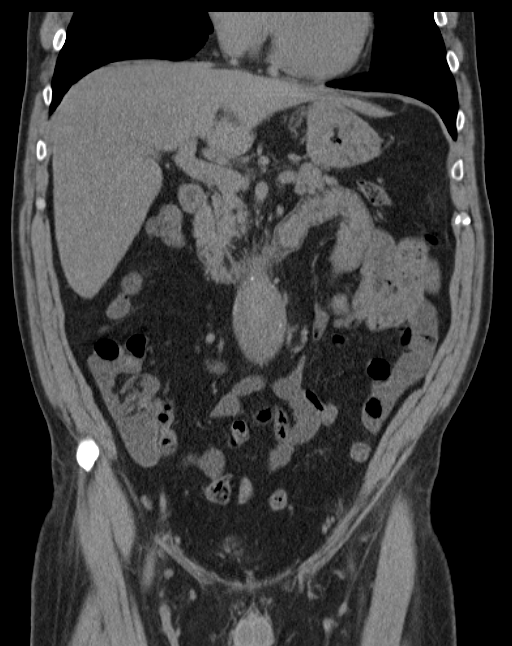
[im 56/100  soft-tissue]
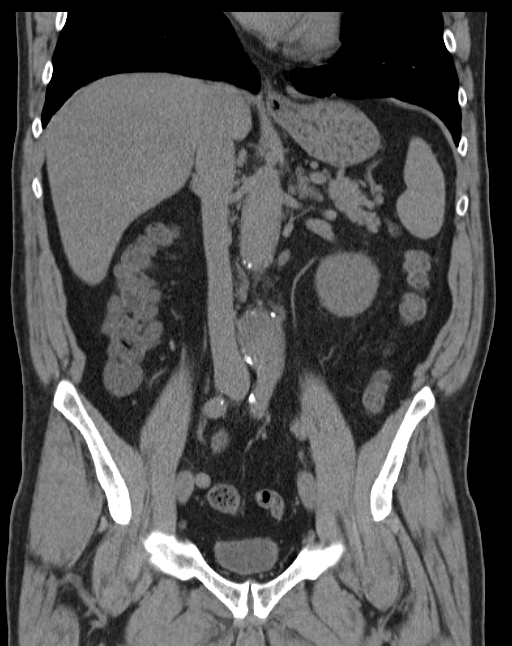

[16 of 46 positions shown; findings below may reference images not displayed]

FINDINGS: Mild abdominal aortic to renal vascular calcification is
seen, without definite intrarenal calculi.  No evidence of ureteral
calculi or hydronephrosis.  No evidence of perinephric fluid or
inflammatory changes.

An infrarenal abdominal aortic aneurysm is seen measuring 5.1 cm in
maximum diameter.  There is no evidence of aneurysm leak or
rupture.

The other abdominal parenchymal organs have a normal appearance on
this noncontrast study.  Gallbladder is unremarkable.  No soft
tissue masses or lymphadenopathy identified.  No evidence of
inflammatory process or abnormal fluid collections.
IMPRESSION: 1.  Probable mild renal vascular calcification.  No definite
urolithiasis or hydronephrosis.
2.  5.1 cm infrarenal abdominal aortic aneurysm.  No evidence of
aneurysm leak or rupture.

## 2012-09-01 MED ORDER — HYDROCODONE-ACETAMINOPHEN 5-325 MG PO TABS: 1.0000 | ORAL_TABLET | ORAL | Status: DC | PRN

## 2012-09-01 NOTE — ED Notes (Signed)
Patient states he does not need anything at this time. 

## 2012-09-01 NOTE — ED Notes (Signed)
Patient with c/o right lower back pain that started several days ago. Seen by PCP. Blood work and urine done then.

## 2012-09-02 NOTE — ED Provider Notes (Signed)
History     CSN: [id number]  Arrival date & time 09/01/12  0808   First MD Initiated Contact with Patient 09/01/12 [phone]      Chief Complaint  Patient presents with  . Back Pain    (Consider location/radiation/quality/duration/timing/severity/associated sxs/prior treatment) HPI Comments: Aaron [name] presents with right lower back pain which has been present for the past 2 weeks.  He denies any injury, but does have a history of arthritis in his lower back with occasional episodes of short lived pain.  However, the current episode has been longer lasting and is also accompanied by increased urinary frequency,  Stating "I can pee 25 times a day".  He denies dysuria and hematuria and never experienced flank pain, nausea, vomiting or fevers.  He has also had no changes in bowel habits.  He was seen by his pcp yesterday at which time blood work was obtained, results pending.  He states he had a full exam,  Including a prostate check which was normal.  He has taken advil without relief of pain symptoms.  It is worse with palpation,  Range of motion and does not radiate.  Pain is dull in character and constant.  The history is provided by the patient.    Past Medical History  Diagnosis Date  . Hyperlipidemia   . HTN (hypertension)   . Gout   . Rash 2011    left chest, biopsy 2012 Dr. Charlton Haws, results pending  . Dyspnea     normal PFT April 2012  . Tobacco abuse   . Asthma     Past Surgical History  Procedure Date  . Appendectomy   . Tonsillectomy   . Wrist surgery     left    Family History  Problem Relation Age of Onset  . Heart attack Father 63  . COPD Father   . Asthma Daughter     History  Substance Use Topics  . Smoking status: Current Every Day Smoker -- 1.5 packs/day for 52.5 years    Types: Cigarettes  . Smokeless tobacco: Not on file   Comment: started in 1977  . Alcohol Use: No     past ETOH use quit 11-26-2009      Review of Systems    Constitutional: Negative for fever and chills.  HENT: Negative for sore throat and neck pain.   Eyes: Negative.   Respiratory: Negative for chest tightness and shortness of breath.   Cardiovascular: Negative for chest pain.  Gastrointestinal: Negative for nausea, vomiting, abdominal pain and abdominal distention.  Genitourinary: Negative.   Musculoskeletal: Positive for back pain. Negative for joint swelling and arthralgias.  Skin: Negative.  Negative for rash and wound.  Neurological: Negative for dizziness, weakness, light-headedness, numbness and headaches.  Hematological: Negative.   Psychiatric/Behavioral: Negative.     Allergies  Review of patient's allergies indicates no known allergies.  Home Medications   Current Outpatient Rx  Name Route Sig Dispense Refill  . ALLOPURINOL 100 MG PO TABS Oral Take 100 mg by mouth 2 (two) times daily.     Marland Kitchen FLUTICASONE-SALMETEROL 250-50 MCG/DOSE IN AEPB Inhalation Inhale 1 puff into the lungs every 12 (twelve) hours.      . GUAIFENESIN-DM 100-10 MG/5ML PO SYRP Oral Take 5 mLs by mouth 3 (three) times daily as needed. Cough.    Marland Kitchen PANTOPRAZOLE SODIUM 40 MG PO TBEC Oral Take 40 mg by mouth 2 (two) times daily.      Marland Kitchen SIMVASTATIN 10 MG PO TABS  Oral Take 10 mg by mouth every other day.     Marland Kitchen HYDROCODONE-ACETAMINOPHEN 5-325 MG PO TABS Oral Take 1 tablet by mouth every 4 (four) hours as needed for pain. 20 tablet 0    BP 142/84  Pulse 62  Temp 98.1 F (36.7 C) (Oral)  Resp 18  Ht 6\' 2"  (1.88 m)  Wt 215 lb (97.523 kg)  BMI 27.60 kg/m2  SpO2 96%  Physical Exam  Nursing note and vitals reviewed. Constitutional: He appears well-developed and well-nourished.  HENT:  Head: Normocephalic.  Eyes: Conjunctivae normal are normal.  Neck: Normal range of motion. Neck supple.  Cardiovascular: Normal rate and intact distal pulses.        Pedal pulses normal.  Pulmonary/Chest: Effort normal.  Abdominal: Soft. Bowel sounds are normal. He exhibits  no distension and no mass. There is no tenderness.  Musculoskeletal: Normal range of motion. He exhibits no edema.       Lumbar back: He exhibits tenderness. He exhibits no swelling, no edema and no spasm.       Reproducible pain right posterior pelvis near pelvic rim.  No cva tenderness.  Neurological: He is alert. He has normal strength. He displays no atrophy and no tremor. No sensory deficit. Gait normal.  Reflex Scores:      Patellar reflexes are 2+ on the right side and 2+ on the left side.      Achilles reflexes are 2+ on the right side and 2+ on the left side.      No strength deficit noted in hip and knee flexor and extensor muscle groups.  Ankle flexion and extension intact.  Skin: Skin is warm and dry.  Psychiatric: He has a normal mood and affect.    ED Course  Procedures (including critical care time)   Labs Reviewed  URINALYSIS, ROUTINE W REFLEX MICROSCOPIC  LAB REPORT - SCANNED   Ct Abdomen Pelvis Wo Contrast  09/01/2012  *RADIOLOGY REPORT*  Clinical Data: Right flank and back pain.  Nephrolithiasis.  CT ABDOMEN AND PELVIS WITHOUT CONTRAST  Technique:  Multidetector CT imaging of the abdomen and pelvis was performed following the standard protocol without intravenous contrast.  Comparison: None.  Findings: Mild abdominal aortic to renal vascular calcification is seen, without definite intrarenal calculi.  No evidence of ureteral calculi or hydronephrosis.  No evidence of perinephric fluid or inflammatory changes.  An infrarenal abdominal aortic aneurysm is seen measuring 5.1 cm in maximum diameter.  There is no evidence of aneurysm leak or rupture.  The other abdominal parenchymal organs have a normal appearance on this noncontrast study.  Gallbladder is unremarkable.  No soft tissue masses or lymphadenopathy identified.  No evidence of inflammatory process or abnormal fluid collections.  IMPRESSION:  1.  Probable mild renal vascular calcification.  No definite urolithiasis or  hydronephrosis. 2.  5.1 cm infrarenal abdominal aortic aneurysm.  No evidence of aneurysm leak or rupture.   Original Report Authenticated By: Danae Orleans, M.D.      1. Abdominal aortic aneurysm       MDM  Spoke with Dr.  Hart Rochester With vascular surgery who also pulled up CT and reviewed during phone call.  Advised close office follow up to discuss options.  Pt information was given to Dr.  Hart Rochester, his office to call pt with appt time.  Pt also advised to call their office if they have not called in the next 24 hours.  Pt and wife at bedside understand.  Oxycodone prescribed  for pain.  Heat therapy to lower back,  Possibly pain related to musculoskeletal source given reproducible quality.    20 minutes spent counseling pt regarding Ct findings.         Burgess Amor, [place] 09/02/12 1801

## 2012-09-03 NOTE — ED Provider Notes (Signed)
Medical screening examination/treatment/procedure(s) were performed by non-physician practitioner and as supervising physician I was immediately available for consultation/collaboration. Laquesha Holcomb, MD, FACEP   Treazure Nery L Irie Dowson, MD 09/03/12 1504 

## 2012-09-06 ENCOUNTER — Encounter: Payer: Self-pay | Admitting: Surgery

## 2012-09-07 ENCOUNTER — Encounter: Payer: Self-pay | Admitting: Vascular Surgery

## 2012-09-07 ENCOUNTER — Ambulatory Visit (INDEPENDENT_AMBULATORY_CARE_PROVIDER_SITE_OTHER): Payer: Managed Care, Other (non HMO) | Admitting: Vascular Surgery

## 2012-09-07 VITALS — BP 132/94 | HR 81 | Ht 74.0 in | Wt 216.0 lb

## 2012-09-07 DIAGNOSIS — I714 Abdominal aortic aneurysm, without rupture, unspecified: Secondary | ICD-10-CM

## 2012-09-07 DIAGNOSIS — Z01818 Encounter for other preprocedural examination: Secondary | ICD-10-CM

## 2012-09-07 NOTE — Addendum Note (Signed)
Addended by: Sharee Pimple on: 09/07/2012 02:46 PM   Modules accepted: Orders

## 2012-09-07 NOTE — Progress Notes (Signed)
Subjective:     Patient ID: Aaron [name], male   DOB: 1956/05/07, 56 y.o.   MRN: [id number]  HPI this 56 year old male is referred by the any pain in the emergency department for a recently discovered abdominal aortic aneurysm. The patient reported to the emergency room last Wednesday with some lower right back discomfort which was musculoskeletal in nature. Over the past 3 days that has now resolved. CT scan revealed a 5.1 cm infrarenal abdominal aortic aneurysm with no evidence of leakage. Patient had no prior knowledge of aneurysm and has no family history of aneurysms. He is a chronic smoker with greater than 60 pack years.  Past Medical History  Diagnosis Date  . Hyperlipidemia   . HTN (hypertension)   . Gout   . Rash 2011    left chest, biopsy 2012 Dr. Charlton Haws, results pending  . Dyspnea     normal PFT April 2012  . Tobacco abuse   . Asthma     History  Substance Use Topics  . Smoking status: Current Some Day Smoker -- 0.2 packs/day for 52.5 years    Types: Cigarettes  . Smokeless tobacco: Current User    Types: Snuff   Comment: started patch on 09/02/2012 has only smoked 3 since then  . Alcohol Use: No     past ETOH use quit 11-26-2009    Family History  Problem Relation Age of Onset  . Heart attack Father 46  . COPD Father   . Heart disease Father   . Asthma Daughter     No Known Allergies  Current outpatient prescriptions:Albuterol Sulfate (PROAIR HFA IN), Inhale 2 puffs into the lungs every 6 (six) hours., Disp: , Rfl: ;  allopurinol (ZYLOPRIM) 100 MG tablet, Take 100 mg by mouth daily. , Disp: , Rfl: ;  Fluticasone-Salmeterol (ADVAIR DISKUS) 250-50 MCG/DOSE AEPB, Inhale 1 puff into the lungs every 6 (six) hours as needed. , Disp: , Rfl:  HYDROcodone-acetaminophen (NORCO/VICODIN) 5-325 MG per tablet, Take 1 tablet by mouth every 4 (four) hours as needed for pain., Disp: 20 tablet, Rfl: 0;  pantoprazole (PROTONIX) 40 MG tablet, Take 40 mg by mouth daily. , Disp: ,  Rfl: ;  simvastatin (ZOCOR) 10 MG tablet, Take 10 mg by mouth daily. , Disp: , Rfl:  guaiFENesin-dextromethorphan (ROBITUSSIN DM) 100-10 MG/5ML syrup, Take 5 mLs by mouth 3 (three) times daily as needed. Cough., Disp: , Rfl:   BP 132/94  Pulse 81  Ht 6\' 2"  (1.88 m)  Wt 216 lb (97.977 kg)  BMI 27.73 kg/m2  SpO2 96%  Body mass index is 27.73 kg/(m^2).           Review of Systems denies chest pain but does have a history of productive cough and asthma and is treated with daily inhaler. No history of dyspnea on exertion-PND, orthopnea, hemoptysis, claudication. Also history of urinary frequency recently with negative workup by his medical doctor. Other systems negative and complete review of systems     Objective:   Physical Exam blood pressure 130/94 heart rate 81 respirations 18 Gen.-alert and oriented x3 in no apparent distress HEENT normal for age Lungs no rhonchi or wheezing Cardiovascular regular rhythm no murmurs carotid pulses 3+ palpable no bruits audible Abdomen soft nontender no palpable masses Musculoskeletal free of  major deformities Skin clear -no rashes Neurologic normal Lower extremities 3+ femoral and dorsalis pedis pulses palpable bilaterally with no edema  Today I reviewed the CT scan without contrast obtained at [facility]  emergency room last week. Patient does have a 5.1 cm infrarenal aortic aneurysm with an adequate infrarenal neck for aortic stent grafting.     Assessment:     Infrarenal abdominal aortic aneurysm-5.1 cm maximum diameter No history coronary artery disease    Plan:     #1 obtain Cardiolite to rule out silent coronary artery disease #2 emphasized importance of discontinuing smoking which he didn't one week ago but does have 30-40 year history of 2 packs per day #3 planned aortic stent graft-Gore-on Wednesday, October 30 Risks and benefits of staying graft including development of endoleak and need for further intervention as well as  open repair were all discussed and patient would like to proceed

## 2012-09-08 ENCOUNTER — Ambulatory Visit
Admission: RE | Admit: 2012-09-08 | Discharge: 2012-09-08 | Disposition: A | Discharge status: Home / Self Care | Payer: Managed Care, Other (non HMO) | Source: Ambulatory Visit | Attending: Internal Medicine | Admitting: Internal Medicine

## 2012-09-08 ENCOUNTER — Other Ambulatory Visit: Payer: Self-pay | Admitting: Internal Medicine

## 2012-09-08 ENCOUNTER — Other Ambulatory Visit: Payer: Self-pay | Admitting: *Deleted

## 2012-09-08 DIAGNOSIS — R059 Cough, unspecified: Secondary | ICD-10-CM

## 2012-09-08 DIAGNOSIS — R05 Cough: Secondary | ICD-10-CM

## 2012-09-08 IMAGING — CR DG CHEST 2V
2 series · 2 of 2 positions shown · non-contrast
Comparison: [DATE].

CLINICAL DATA: Cough.  Cigarette smoker.  Emphysema.  Infiltrates.

CHEST - 2 VIEW

[w chest pa]
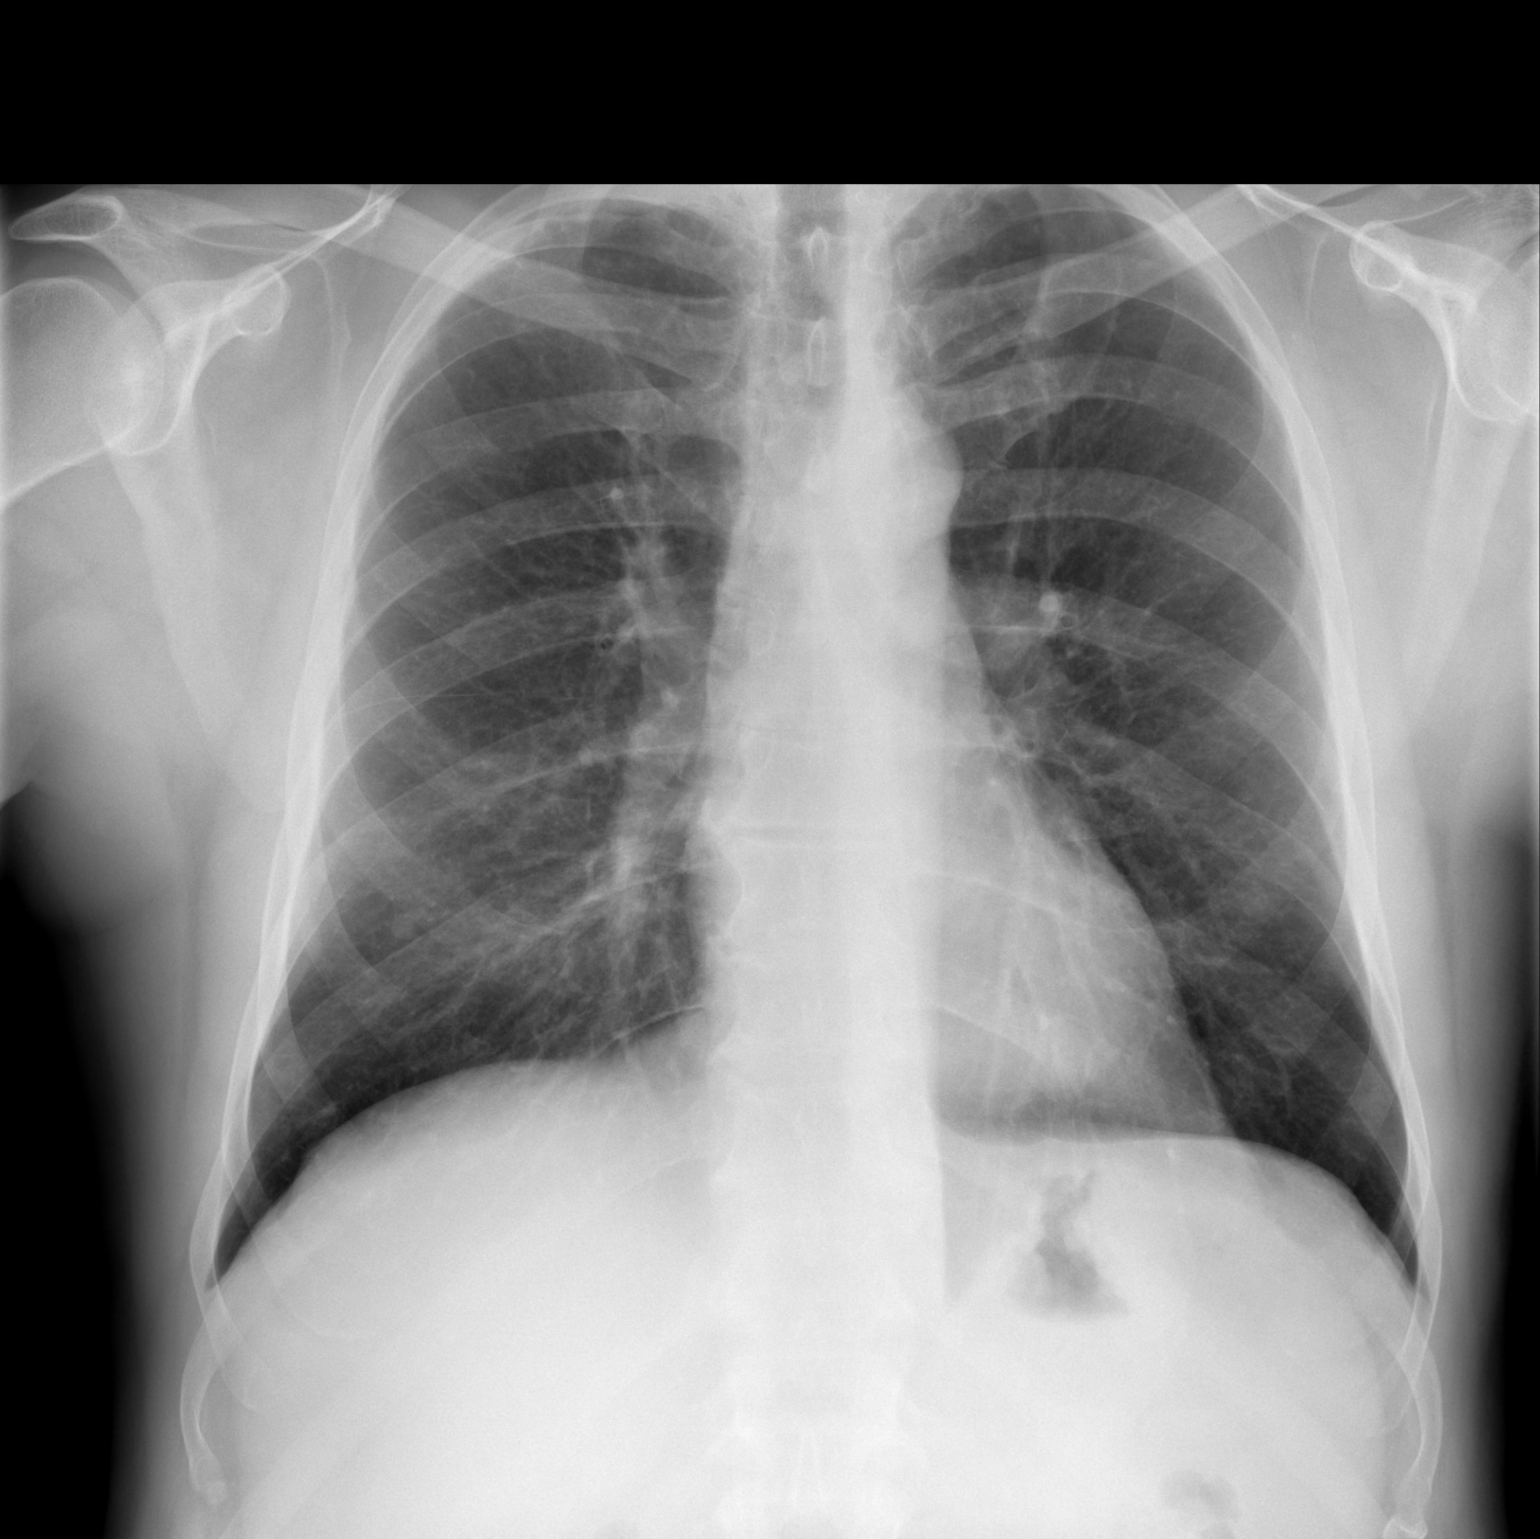

[w chest lat]
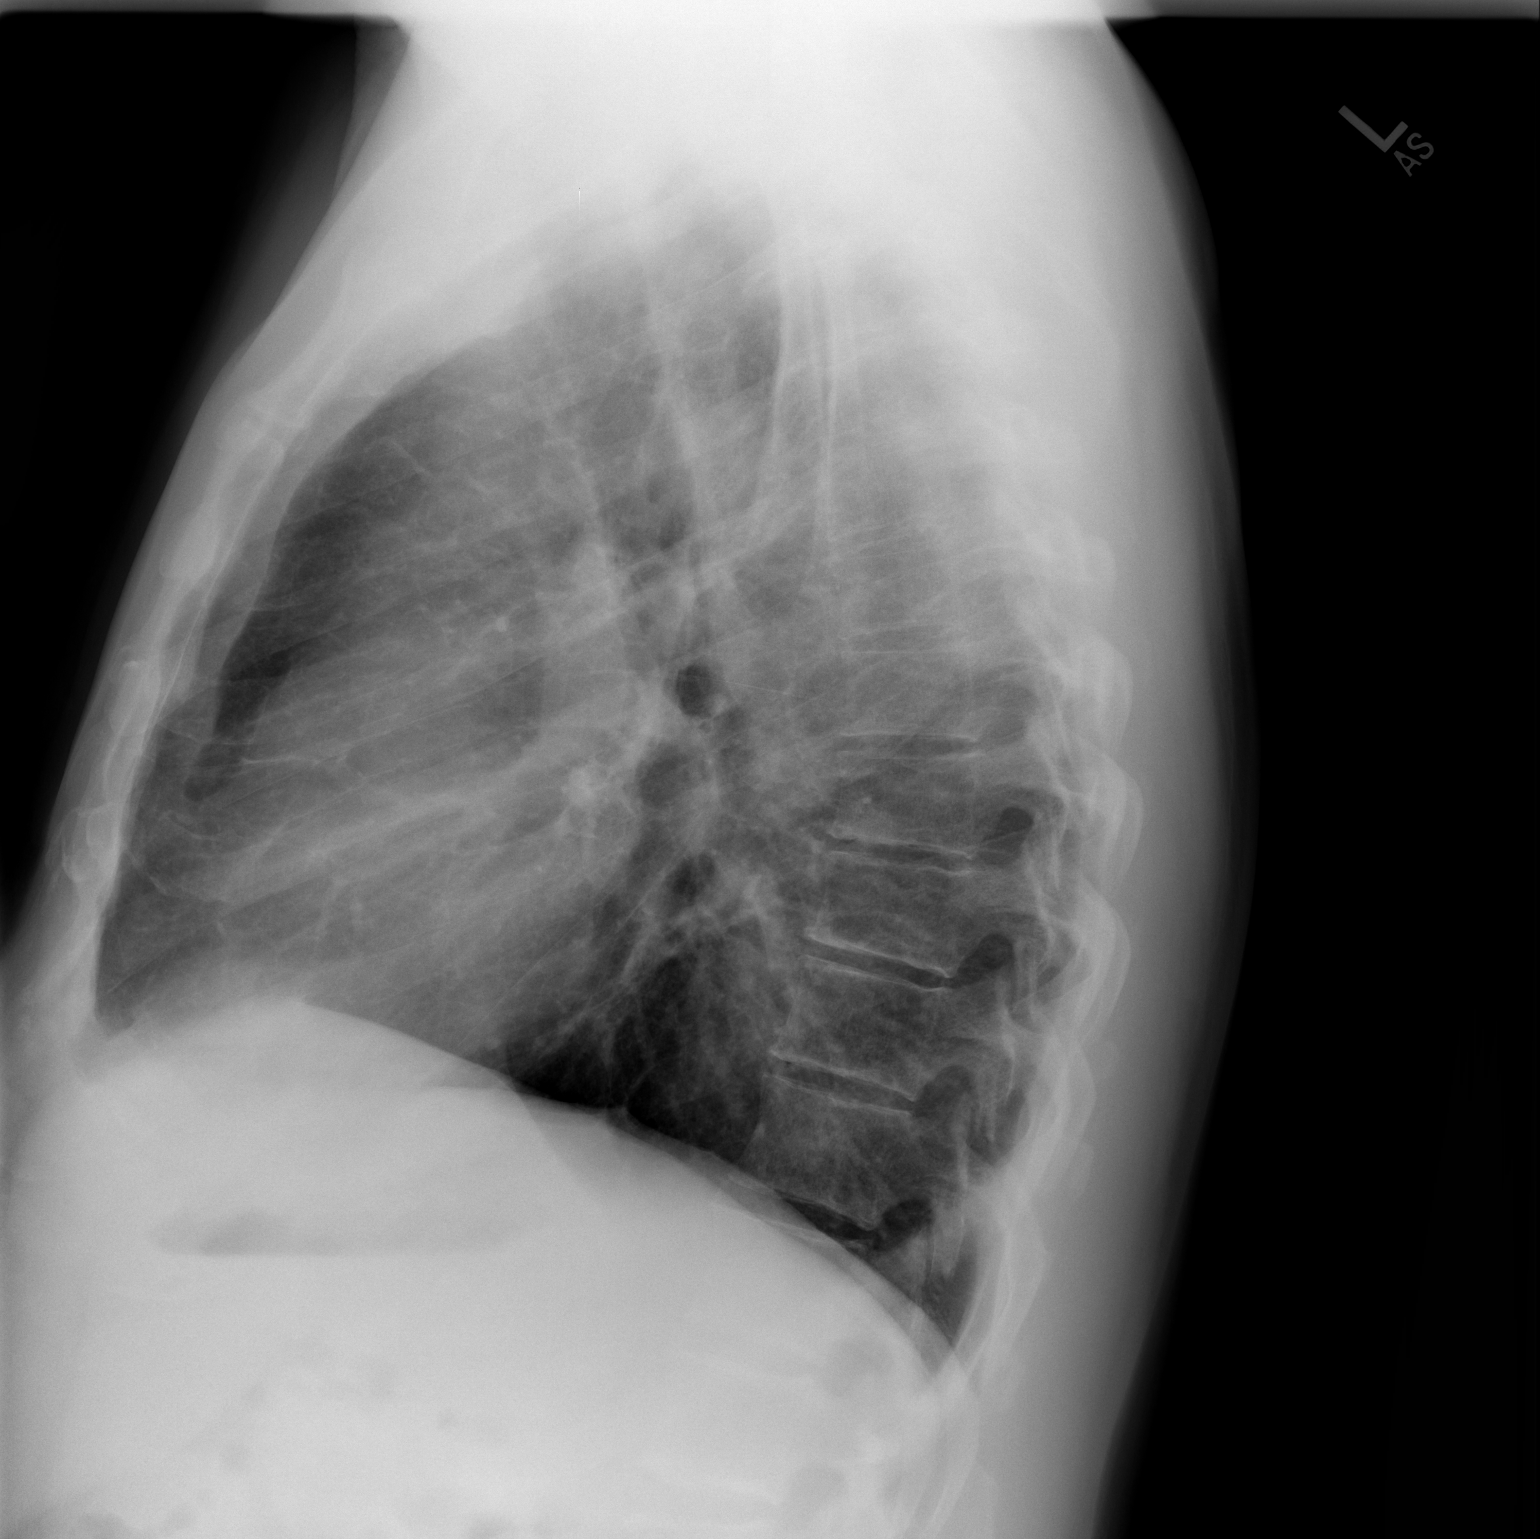

[2 of 2 positions shown; findings below may reference images not displayed]

FINDINGS: Cardiopericardial silhouette within normal limits.
Mediastinal contours normal. Trachea midline.  No airspace disease
or effusion.  Bilateral pleural apical scarring is present.  Nipple
shadows are present, which are unchanged compared to the prior
exam. There is no hyperinflation.  Retrosternal clear space appears
within normal limits.
IMPRESSION: No active cardiopulmonary disease.

## 2012-09-10 ENCOUNTER — Telehealth: Payer: Self-pay | Admitting: Vascular Surgery

## 2012-09-10 ENCOUNTER — Encounter (HOSPITAL_COMMUNITY): Payer: Self-pay | Admitting: Pharmacy Technician

## 2012-09-10 NOTE — Telephone Encounter (Signed)
I spoke with Aaron [name] (pt's wife) this morning regarding insurance pre-authorization for the pre-operative lexiscan/myoview that JDL had ordered for her husband Aaron [name]. It is scheduled for 09/17/12 @ Dr.Ganji's office. Per Aaron [name] will cover the test if the patient sees the cardiologist prior to the test. The patient is scheduled to see Dr.Ganji on Monday 09/13/12 at 9am. Then he can order the myoview. The patient is aware of these appointments. Jacklyn Shell

## 2012-09-14 ENCOUNTER — Encounter (HOSPITAL_COMMUNITY): Payer: Self-pay

## 2012-09-14 ENCOUNTER — Encounter (HOSPITAL_COMMUNITY)
Admission: RE | Admit: 2012-09-14 | Discharge: 2012-09-14 | Disposition: A | Discharge status: Home / Self Care | Payer: Managed Care, Other (non HMO) | Source: Ambulatory Visit | Attending: Vascular Surgery | Admitting: Vascular Surgery

## 2012-09-14 HISTORY — DX: Abdominal aortic aneurysm, without rupture: I71.4

## 2012-09-14 HISTORY — DX: Unspecified osteoarthritis, unspecified site: M19.90

## 2012-09-14 HISTORY — DX: Personal history of other diseases of the digestive system: Z87.19

## 2012-09-14 HISTORY — DX: Abdominal aortic aneurysm, without rupture, unspecified: I71.40

## 2012-09-14 HISTORY — DX: Gastro-esophageal reflux disease without esophagitis: K21.9

## 2012-09-14 LAB — BLOOD GAS, ARTERIAL
Acid-base deficit: 0 mmol/L (ref 0.0–2.0)
Bicarbonate: 23.8 mEq/L (ref 20.0–24.0)
O2 Saturation: 94.9 %
TCO2: 24.9 mmol/L (ref 0–100)
pO2, Arterial: 71.3 mmHg — ABNORMAL LOW (ref 80.0–100.0)

## 2012-09-14 LAB — URINALYSIS, ROUTINE W REFLEX MICROSCOPIC
Hgb urine dipstick: NEGATIVE
Protein, ur: NEGATIVE mg/dL
Urobilinogen, UA: 0.2 mg/dL (ref 0.0–1.0)

## 2012-09-14 LAB — PROTIME-INR: Prothrombin Time: 13.3 seconds (ref 11.6–15.2)

## 2012-09-14 LAB — SURGICAL PCR SCREEN: MRSA, PCR: NEGATIVE

## 2012-09-14 NOTE — Pre-Procedure Instructions (Signed)
22 Aaron [name]  09/14/2012   Your procedure is scheduled on:  Wednesday September 22, 2012  Report to [facility] at 6:30 AM.  Call this number if you have problems the morning of surgery: [phone]   Remember:   Do not eat food or drink :After Midnight.      Take these medicines the morning of surgery with A SIP OF WATER: albuterol (bring day of surgery), allopurinol, advair, hydrocodone, protonix,    Do not wear jewelry, make-up or nail polish.  Do not wear lotions, powders, or perfumes.  Do not shave 48 hours prior to surgery. Men may shave face and neck.  Do not bring valuables to the hospital.  Contacts, dentures or bridgework may not be worn into surgery.  Leave suitcase in the car. After surgery it may be brought to your room.  For patients admitted to the hospital, checkout time is 11:00 AM the day of discharge.   Patients discharged the day of surgery will not be allowed to drive home.  Name and phone number of your driver: famjily / friend  Special Instructions: Shower using CHG 2 nights before surgery and the night before surgery.  If you shower the day of surgery use CHG.  Use special wash - you have one bottle of CHG for all showers.  You should use approximately 1/3 of the bottle for each shower.   Please read over the following fact sheets that you were given: Pain Booklet, Coughing and Deep Breathing, Blood Transfusion Information, MRSA Information and Surgical Site Infection Prevention

## 2012-09-14 NOTE — Progress Notes (Addendum)
Contacted Dr. Wyman Songster office requested lab work that was done on 10/21//13 to be faxed. Contacted Dr. Jacinto Halim' s office, spoke with Lupita Leash and requested EKG from 09/13/12.

## 2012-09-16 ENCOUNTER — Encounter: Payer: Self-pay | Admitting: Vascular Surgery

## 2012-09-16 NOTE — Consult Note (Addendum)
Anesthesia chart review: Patient is a 56 year old male scheduled for endovascular stent repair of a 5.1 cm AAA on 09/22/12 by Dr. Hart Rochester.  History includes smoking, HTN, HLD, gout, dyspnea, asthma.  EKG from Dr. Verl Dicker office on 09/13/12 showed NSR, cannot rule out septal infarct (age undetermined), negative T waves in V1, V2.  Notes indicate the patient is having a nuclear stress test on 09/17/2012 at [facility].  Labs from 09/14/12 noted.  He also had a CBC with diff and CMET at Dr. Migdalia Dk office [facility]) on 09/13/12 that showed WBC 6.8, hemoglobin 15.7, hematocrit 45.0, platelet count 319. CMET was normal showing a creatinine of 1.06, glucose 89, sodium 141, potassium 4.8.  Chest xray on 09/08/2012 showed no active cardiopulmonary disease.  Epic indicates that patient has an appointment with Pulmonologist Dr. Marchelle Gearing on 09/20/12.  I'll follow-up once additional cardiology and pulmonology records are available.  Shonna Chock, PA-C 09/16/12 1224  Addendum: 09/20/12 1800 Reviewed additional records from Dr. Verl Dicker office.  Stress test on 09/17/12 showed normal isotope uptake both at rest and stress.  No evidence of ischemia or scar.  Normal wall motion and endocardial thickening.  LV EF 62%.  He was felt to be "acceptable (low) CV morbidity and mortality."    He saw Dr. Marchelle Gearing today for a pre-operative pulmonary evaluation note.  According to patient instructions (under Encounter tab) and recommendations:  "Overall risk for AAA repair is low from pulmonary complications. However, if you have open surgery risk will increase to moderate. Risk ameliorating factors are age, normal renal function, and good nutritional state and well controlled asthma and good functional status. Major Pulmonary risks identified in the multifactorial risk analysis are but not limited to a) pneumonia; b) recurrent intubation risk; c) prolonged or recurrent acute respiratory  failure needing mechanical ventilation; d) prolonged hospitalization; e) DVT/Pulmonary embolism; f) Acute Pulmonary edema." Recommend:  1. Short duration of surgery as much as possible and avoid paralytic if possible and laparatomy if possible  2. DVT prophylaxis  3. Aggressive pulmonary toilet with o2, and incentive spirometry and early ambulation  4. Will need for asthma - albuterol q4h, atrovent q4h and pulmicort bid nebs if he cannot inhale. Otherwise, cover with advair.

## 2012-09-17 DIAGNOSIS — I714 Abdominal aortic aneurysm, without rupture, unspecified: Secondary | ICD-10-CM

## 2012-09-20 ENCOUNTER — Encounter: Payer: Self-pay | Admitting: Internal Medicine

## 2012-09-20 ENCOUNTER — Ambulatory Visit (INDEPENDENT_AMBULATORY_CARE_PROVIDER_SITE_OTHER): Payer: Managed Care, Other (non HMO) | Admitting: Internal Medicine

## 2012-09-20 VITALS — BP 112/82 | HR 62 | Temp 98.2°F | Ht 72.0 in | Wt 219.0 lb

## 2012-09-20 DIAGNOSIS — Z23 Encounter for immunization: Secondary | ICD-10-CM

## 2012-09-20 DIAGNOSIS — F17200 Nicotine dependence, unspecified, uncomplicated: Secondary | ICD-10-CM

## 2012-09-20 DIAGNOSIS — Z01811 Encounter for preprocedural respiratory examination: Secondary | ICD-10-CM

## 2012-09-20 NOTE — Progress Notes (Signed)
Subjective:    Patient ID: Aaron [name], male    DOB: Oct 12, 1956, 56 y.o.   MRN: [id number]  HPI #Smoker  - 52 pack  #Ex-etoh  - quit 2011  #ASthma/Dyspnea  - normal cardiac stress test 2006  - CXR 2011 - hyperinflation  - PFTs April 2012 (off advair ) - normal  -  methacholine challenge test 03/05/2011: POSITIVE FOR ASTHMA (PC 20 <4).  - pneumovax 2012    OV 09/20/2012  ASthma patient. Smoker  Not seen in > 1 year. Here for new issue  Which is Pre-operative Pulmonary Clearance prior to AAA repair  Reports back ache 08/24/12 that quickly worsened and fw days later went to ER on 09/01/12. CT diagnosed 5.1cm infra-renal AAA. Having surgery Wednesday 09/22/12 by Dr Hart Rochester - endovascular stent  Graft repair of  AAA.  THere is no  Planned laparatomy per patient. Otherwise, feels fine. BAck pain is resolved. Has had nuclear med stress 09/17/12 by Dr Jacinto Halim - reports pending per hx. In terms of resp status: using advair bid with reported > 90% compliance. Feels asthma is stable. Denies nocturnal cough or edema or wheeze. Last week alb use < 2 times per week. Active without dyspnea.   In terms of smoking: on patch since 09/02/12. No cigs last week     Preoperative Pulmonary risk assessment done and below  [place] Postperative Pulmonary Risk Score SCORE/COmments Comments  Type of surgery - abd ao aneurysm (27), thoracic (21), neurosurgery / upper abdominal / vascular (21), neck (11) 0 Planned as endovascular  Emergency Surgery - (11) 0 NO  ALbumin < 3 or poor nutritional state - (9) 0  Clinically good status  BUN > 30 -  (8) 0 Normal  Creat oct 2013  Partial or completely dependent functional status - (7) 0   functional  COPD -  (6) 0 Well controlled asthma  Age - 49 to 33 (4), > 70  (6) 0  age 51  TOTAL 0   Risk Stratifcation scores  - < 10, 11-19, 20-27, 28-40, >40 Low      CANET Postperative Pulmonary Risk Score Score Comments  Age - <50 (0), 50-80 (3), >80 (16) 3  Age  46  Preoperative pulse ox - >96 (0), 91-95 (8), <90 (24) 0 (96%) Pulse ox96%  Respiratory infection in last month - Yes (17) 17  Got empiric zpak 09/02/12  Preoperative anemia - < 10gm% - Yes (11) 0  hgb 14gm% - oct 2013  Surgical incision - Upper abdominal (15), Thoracic (24) 0  Endovascular planned  Duration of surgery - <2h (0), 2-3h (16), >3h (23) 0 "couple of hours" per Dr Hart Rochester  Emergency Surgery - Yes (8) 0   TOTAL 20   Risk Stratification - Low (<26), Intermediate (26-44), High (>45) Low      Review of Systems  Constitutional: Negative for fever and unexpected weight change.  HENT: Negative for ear pain, nosebleeds, congestion, sore throat, rhinorrhea, sneezing, trouble swallowing, dental problem, postnasal drip and sinus pressure.   Eyes: Negative for redness and itching.  Respiratory: Negative for cough, chest tightness, shortness of breath and wheezing.   Cardiovascular: Negative for palpitations and leg swelling.  Gastrointestinal: Negative for nausea and vomiting.  Genitourinary: Negative for dysuria.  Musculoskeletal: Negative for joint swelling.  Skin: Negative for rash.  Neurological: Negative for headaches.  Hematological: Does not bruise/bleed easily.  Psychiatric/Behavioral: Negative for dysphoric mood. The patient is not nervous/anxious.  Objective:   Physical Exam Nursing note and vitals reviewed. Constitutional: He is oriented to person, place, and time. He appears well-developed and well-nourished. No distress.  HENT:  Head: Normocephalic and atraumatic.  Right Ear: External ear normal.  Left Ear: External ear normal.  Mouth/Throat: Oropharynx is clear and moist. No oropharyngeal exudate.  Eyes: Conjunctivae and EOM are normal. Pupils are equal, round, and reactive to light. Right eye exhibits no discharge. Left eye exhibits no discharge. No scleral icterus.  Neck: Normal range of motion. Neck supple. No JVD present. No tracheal deviation present.  No thyromegaly present.  Cardiovascular: Normal rate, regular rhythm and intact distal pulses.  Exam reveals no gallop and no friction rub.   No murmur heard. Pulmonary/Chest: Effort normal and breath sounds normal. No respiratory distress. He has no wheezes. He has no rales. He exhibits no tenderness.  Abdominal: Soft. Bowel sounds are normal. He exhibits no distension and no mass. There is no tenderness. There is no rebound and no guarding.  Musculoskeletal: Normal range of motion. He exhibits no edema and no tenderness.  Lymphadenopathy:    He has no cervical adenopathy.  Neurological: He is alert and oriented to person, place, and time. He has normal reflexes. No cranial nerve deficit. Coordination normal.  Skin: Skin is warm and dry. No rash noted. He is not diaphoretic. No erythema. No pallor.  Psychiatric: He has a normal mood and affect. His behavior is normal. Judgment and thought content normal.           Assessment & Plan:

## 2012-09-20 NOTE — Progress Notes (Signed)
Spoke with Suja in Dr. Verl Dicker office. She will fax stress test results and most recent office visit note to [phone].

## 2012-09-20 NOTE — Patient Instructions (Addendum)
#  Asthma  - appears well controlled  - Have flu shot today  #Preoperative pulmonary risk for AAA repair Overall risk for AAA repair is low from pulmonary complications However, if you have open surgery risk will increase to moderate Risk ameliorating factors are age, normal renal function, and good nutritional state and well controlled asthma and good functional status Major Pulmonary risks identified in the multifactorial risk analysis are but not limited to   a) pneumonia; b) recurrent intubation risk; c) prolonged or recurrent acute respiratory failure needing mechanical ventilation; d) prolonged hospitalization; e)                DVT/Pulmonary embolism; f) Acute Pulmonary edema   I will communicate this with Dr Hart Rochester  REturn to see me in 6 months

## 2012-09-21 DIAGNOSIS — Z01811 Encounter for preprocedural respiratory examination: Secondary | ICD-10-CM | POA: Insufficient documentation

## 2012-09-21 DIAGNOSIS — Z23 Encounter for immunization: Secondary | ICD-10-CM | POA: Insufficient documentation

## 2012-09-21 MED ORDER — DEXTROSE 5 % IV SOLN
1.5000 g | INTRAVENOUS | Status: AC
  Administered 2012-09-22: 1.5 g via INTRAVENOUS
  Filled 2012-09-21: qty 1.5

## 2012-09-21 NOTE — Assessment & Plan Note (Signed)
He is having AAA repair through endovascular approach. Dr Hart Rochester tells me surgery time is "couple of hours"/ Based on this, normal functional status, normal renal function, normal nutritional status, normal oxygenation status his risk for post operative pulmonary complicatins is LOW. I  Have outlined this in HPI through formal risk scoring system.  However, if surgery time is prolonged or open laprotomy is adopted risk will jump to MODEATE   Major Pulmonary risks identified in the multifactorial risk analysis are but not limited to a) pneumonia; b) recurrent intubation risk; c) prolonged or recurrent acute respiratory failure needing mechanical ventilation; d) prolonged hospitalization; e) DVT/Pulmonary embolism; f) Acute Pulmonary edema. I have explaiend to him as uch  Recommend 1. Short duration of surgery as much as possible and avoid paralytic if possible and laparatomy if possible 2. DVT prophylaxis 3. Aggressive pulmonary toilet with o2,  and incentive spirometry and early ambulation 4. Will need for asthma - albuterol q4h, atrovent q4h and pulmicort bid nebs if he cannot inhale. Otherwise, cover with advair  D/w Dr Boris Sharper  > 50% of this > 25 min visit spent in face to face counseling (15 min visit converted to 25 min)

## 2012-09-21 NOTE — Assessment & Plan Note (Signed)
Quit oct 2013.Congratulated

## 2012-09-22 ENCOUNTER — Encounter (HOSPITAL_COMMUNITY): Payer: Self-pay | Admitting: Vascular Surgery

## 2012-09-22 ENCOUNTER — Inpatient Hospital Stay (HOSPITAL_COMMUNITY): Payer: Managed Care, Other (non HMO) | Admitting: Vascular Surgery

## 2012-09-22 ENCOUNTER — Inpatient Hospital Stay (HOSPITAL_COMMUNITY)
Admission: RE | Admit: 2012-09-22 | Discharge: 2012-09-23 | DRG: 238 | Disposition: A | Discharge status: Home / Self Care | Payer: Managed Care, Other (non HMO) | Source: Ambulatory Visit | Attending: Vascular Surgery | Admitting: Vascular Surgery

## 2012-09-22 ENCOUNTER — Inpatient Hospital Stay (HOSPITAL_COMMUNITY): Payer: Managed Care, Other (non HMO)

## 2012-09-22 ENCOUNTER — Encounter (HOSPITAL_COMMUNITY): Payer: Self-pay | Admitting: *Deleted

## 2012-09-22 ENCOUNTER — Encounter (HOSPITAL_COMMUNITY)
Admission: RE | Disposition: A | Discharge status: Home / Self Care | Payer: Self-pay | Source: Ambulatory Visit | Attending: Vascular Surgery

## 2012-09-22 DIAGNOSIS — I714 Abdominal aortic aneurysm, without rupture, unspecified: Secondary | ICD-10-CM

## 2012-09-22 DIAGNOSIS — F17200 Nicotine dependence, unspecified, uncomplicated: Secondary | ICD-10-CM | POA: Diagnosis present

## 2012-09-22 DIAGNOSIS — K219 Gastro-esophageal reflux disease without esophagitis: Secondary | ICD-10-CM | POA: Diagnosis present

## 2012-09-22 DIAGNOSIS — M109 Gout, unspecified: Secondary | ICD-10-CM | POA: Diagnosis present

## 2012-09-22 DIAGNOSIS — K449 Diaphragmatic hernia without obstruction or gangrene: Secondary | ICD-10-CM | POA: Diagnosis present

## 2012-09-22 DIAGNOSIS — J45909 Unspecified asthma, uncomplicated: Secondary | ICD-10-CM | POA: Diagnosis present

## 2012-09-22 DIAGNOSIS — E785 Hyperlipidemia, unspecified: Secondary | ICD-10-CM | POA: Diagnosis present

## 2012-09-22 DIAGNOSIS — I10 Essential (primary) hypertension: Secondary | ICD-10-CM | POA: Diagnosis present

## 2012-09-22 HISTORY — PX: ABDOMINAL AORTIC ENDOVASCULAR STENT GRAFT: SHX5707

## 2012-09-22 LAB — CBC
HCT: 36.6 % — ABNORMAL LOW (ref 39.0–52.0)
Hemoglobin: 12.3 g/dL — ABNORMAL LOW (ref 13.0–17.0)
MCH: 30 pg (ref 26.0–34.0)
MCHC: 33.6 g/dL (ref 30.0–36.0)
MCV: 89.3 fL (ref 78.0–100.0)
Platelets: 196 10*3/uL (ref 150–400)
RBC: 4.1 MIL/uL — ABNORMAL LOW (ref 4.22–5.81)
RDW: 12.5 % (ref 11.5–15.5)
WBC: 7.2 10*3/uL (ref 4.0–10.5)

## 2012-09-22 LAB — BASIC METABOLIC PANEL
BUN: 13 mg/dL (ref 6–23)
Calcium: 8 mg/dL — ABNORMAL LOW (ref 8.4–10.5)
Creatinine, Ser: 0.85 mg/dL (ref 0.50–1.35)
GFR calc Af Amer: >90 mL/min (ref >90)
GFR calc non Af Amer: >90 mL/min (ref >90)

## 2012-09-22 IMAGING — CR DG CHEST 1V PORT
1 series · 1 of 1 positions shown · non-contrast
Comparison: None.

CLINICAL DATA: Central line placement

PORTABLE CHEST - 1 VIEW

[AP]
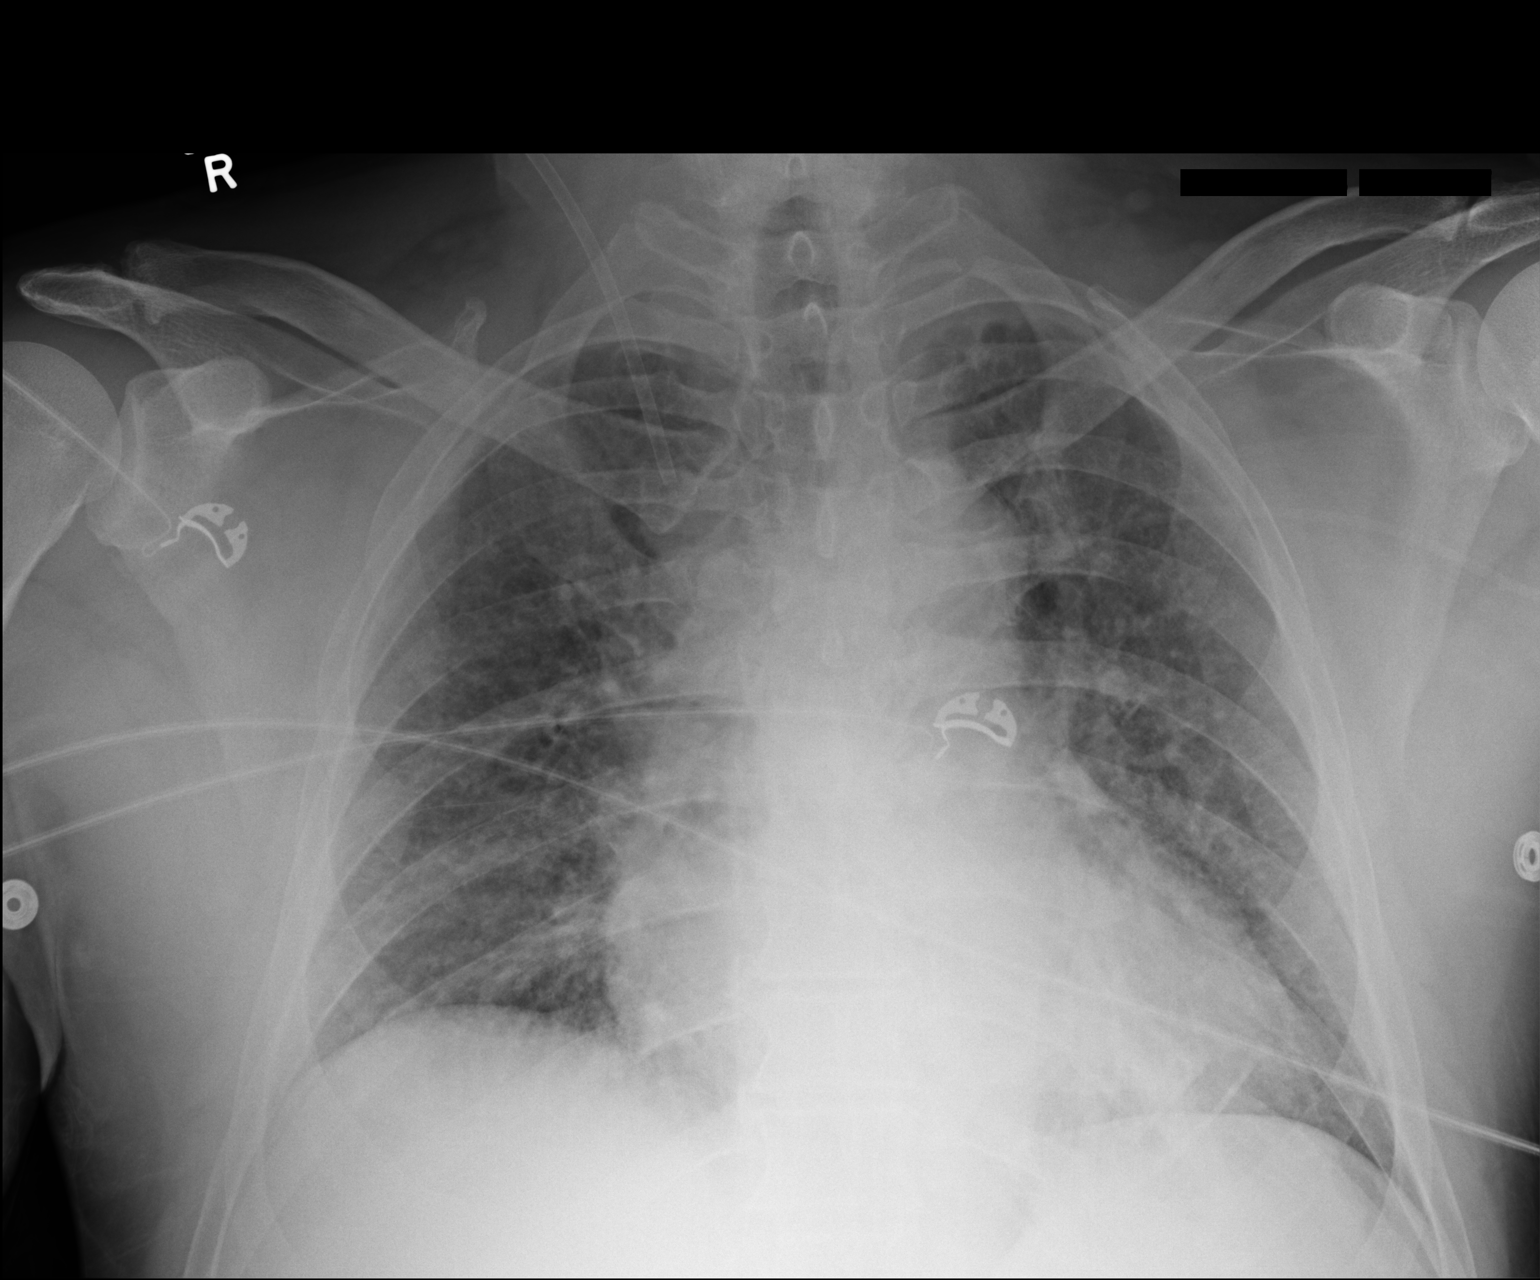

[1 of 1 positions shown; findings below may reference images not displayed]

FINDINGS: Placement of right jugular catheter with the tip in the
right innominate vein.   No pneumothorax.

Interval development of pulmonary edema bilaterally.  No
significant pleural effusion.
IMPRESSION: Right jugular catheter in the right innominate vein.  No
pneumothorax.

Pulmonary edema.

## 2012-09-22 IMAGING — CR DG ABD PORTABLE 1V
1 series · 1 of 1 positions shown · non-contrast
Comparison: CT [DATE]

CLINICAL DATA: Postop stent placement

PORTABLE ABDOMEN - 1 VIEW

[AP]
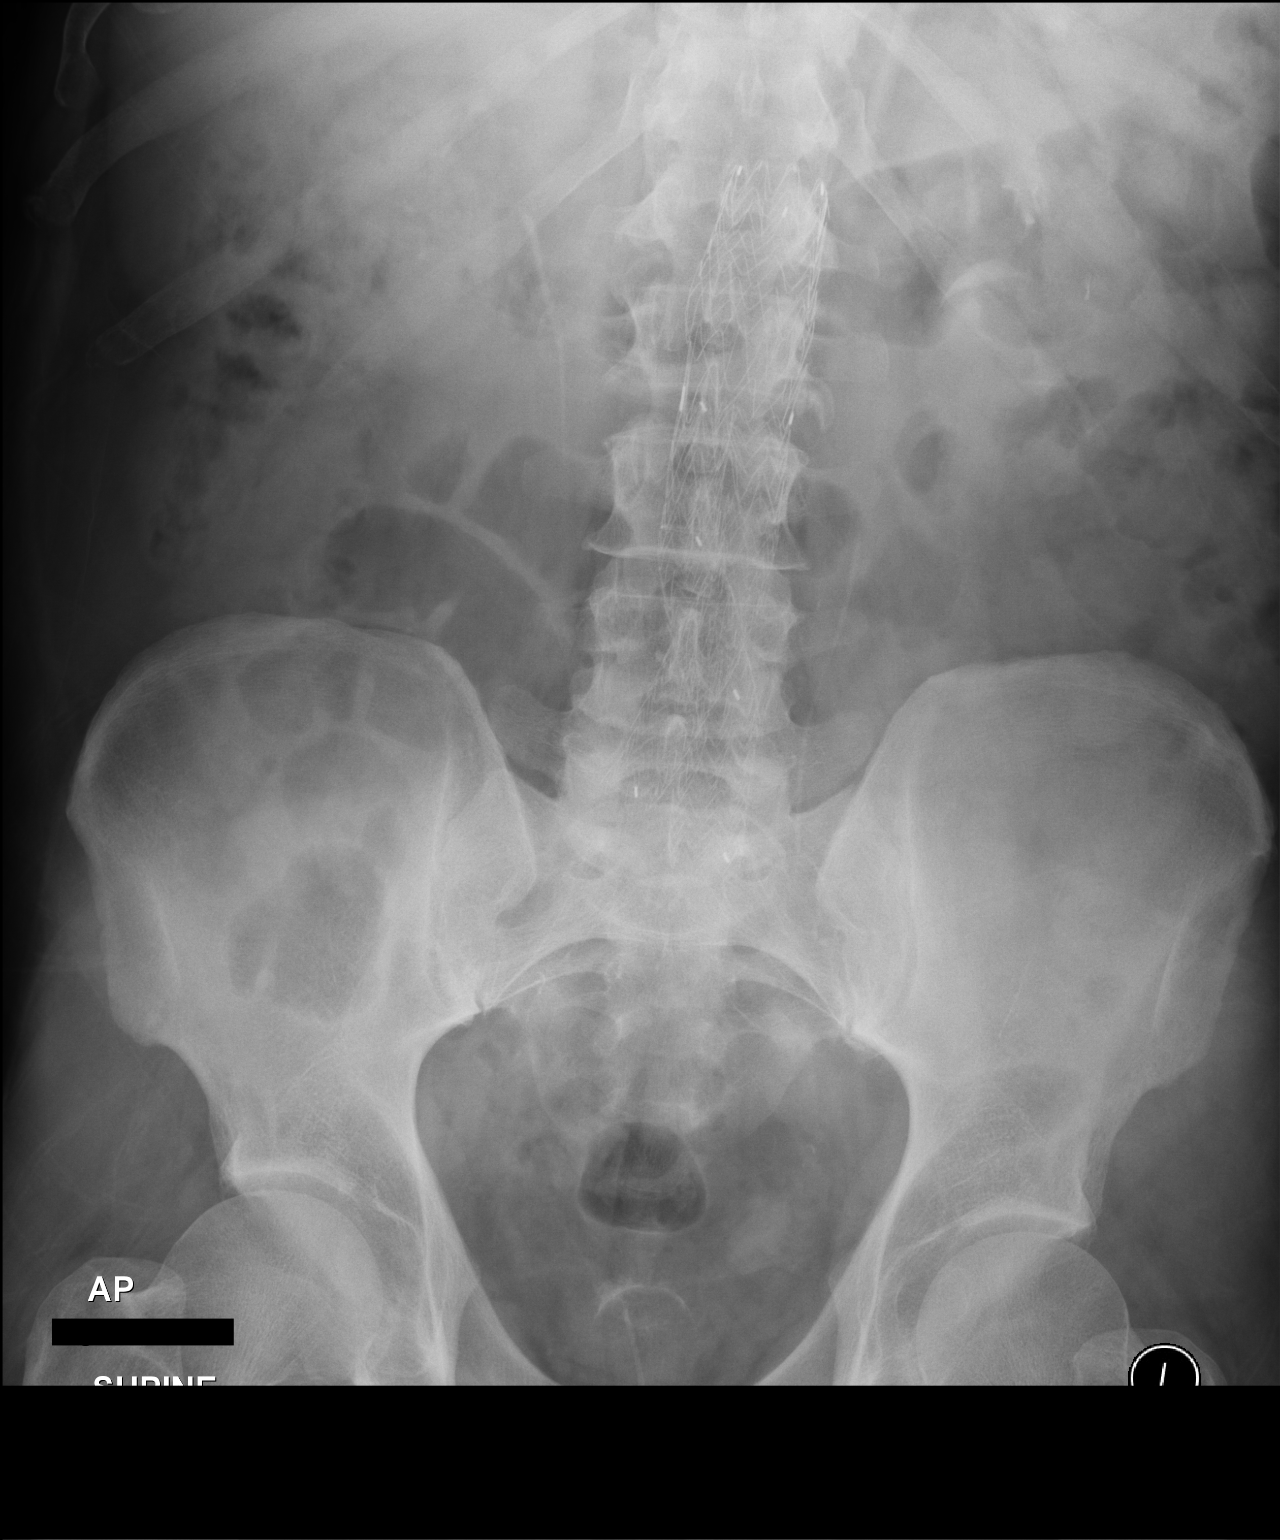

[1 of 1 positions shown; findings below may reference images not displayed]

FINDINGS: Aorta iliac stent graft in satisfactory position.

Contrast excretion by both kidneys without obstruction.  There is a
Foley catheter in the bladder.  No acute bony abnormality.
IMPRESSION: Satisfactory aorta iliac stent graft placement.

## 2012-09-22 SURGERY — INSERTION, ENDOVASCULAR STENT GRAFT, AORTA, ABDOMINAL
Anesthesia: General | Wound class: Clean

## 2012-09-22 MED ORDER — MOMETASONE FURO-FORMOTEROL FUM 100-5 MCG/ACT IN AERO
2.0000 | INHALATION_SPRAY | Freq: Two times a day (BID) | RESPIRATORY_TRACT | Status: DC
  Filled 2012-09-22: qty 8.8

## 2012-09-22 MED ORDER — IODIXANOL 320 MG/ML IV SOLN
INTRAVENOUS | Status: DC | PRN
  Administered 2012-09-22: 50 mL via INTRA_ARTERIAL
  Administered 2012-09-22: 150 mL via INTRA_ARTERIAL

## 2012-09-22 MED ORDER — ACETAMINOPHEN 325 MG PO TABS: 325.0000 mg | ORAL_TABLET | ORAL | Status: DC | PRN

## 2012-09-22 MED ORDER — PROTAMINE SULFATE 10 MG/ML IV SOLN
INTRAVENOUS | Status: DC | PRN
  Administered 2012-09-22: 50 mg via INTRAVENOUS

## 2012-09-22 MED ORDER — MIDAZOLAM HCL 5 MG/5ML IJ SOLN
INTRAMUSCULAR | Status: DC | PRN
  Administered 2012-09-22: 2 mg via INTRAVENOUS

## 2012-09-22 MED ORDER — ACETAMINOPHEN 650 MG RE SUPP: 325.0000 mg | RECTAL | Status: DC | PRN

## 2012-09-22 MED ORDER — LABETALOL HCL 5 MG/ML IV SOLN: 10.0000 mg | INTRAVENOUS | Status: DC | PRN

## 2012-09-22 MED ORDER — MAGNESIUM SULFATE 40 MG/ML IJ SOLN
2.0000 g | Freq: Once | INTRAMUSCULAR | Status: AC | PRN
  Filled 2012-09-22: qty 50

## 2012-09-22 MED ORDER — DOPAMINE-DEXTROSE 3.2-5 MG/ML-% IV SOLN: 3.0000 ug/kg/min | INTRAVENOUS | Status: DC

## 2012-09-22 MED ORDER — DEXTROSE 5 % IV SOLN
1.5000 g | Freq: Two times a day (BID) | INTRAVENOUS | Status: AC
  Administered 2012-09-22 – 2012-09-23 (×2): 1.5 g via INTRAVENOUS
  Filled 2012-09-22 (×2): qty 1.5

## 2012-09-22 MED ORDER — HYDRALAZINE HCL 20 MG/ML IJ SOLN: 10.0000 mg | INTRAMUSCULAR | Status: DC | PRN

## 2012-09-22 MED ORDER — OXYCODONE HCL 5 MG/5ML PO SOLN: 5.0000 mg | Freq: Once | ORAL | Status: DC | PRN

## 2012-09-22 MED ORDER — PANTOPRAZOLE SODIUM 40 MG PO TBEC: 40.0000 mg | DELAYED_RELEASE_TABLET | Freq: Every morning | ORAL | Status: DC

## 2012-09-22 MED ORDER — SODIUM CHLORIDE 0.9 % IV SOLN: INTRAVENOUS | Status: DC

## 2012-09-22 MED ORDER — SODIUM CHLORIDE 0.9 % IV SOLN
INTRAVENOUS | Status: DC
  Administered 2012-09-22: 11:00:00 via INTRAVENOUS

## 2012-09-22 MED ORDER — HYDROMORPHONE HCL PF 1 MG/ML IJ SOLN: 0.2500 mg | INTRAMUSCULAR | Status: DC | PRN

## 2012-09-22 MED ORDER — OXYCODONE-ACETAMINOPHEN 5-325 MG PO TABS: 1.0000 | ORAL_TABLET | ORAL | Status: DC | PRN

## 2012-09-22 MED ORDER — POTASSIUM CHLORIDE CRYS ER 20 MEQ PO TBCR: 20.0000 meq | EXTENDED_RELEASE_TABLET | Freq: Once | ORAL | Status: AC | PRN

## 2012-09-22 MED ORDER — LACTATED RINGERS IV SOLN
INTRAVENOUS | Status: DC | PRN
  Administered 2012-09-22: 08:00:00 via INTRAVENOUS

## 2012-09-22 MED ORDER — DOCUSATE SODIUM 100 MG PO CAPS
100.0000 mg | ORAL_CAPSULE | Freq: Every day | ORAL | Status: DC
  Administered 2012-09-23: 100 mg via ORAL
  Filled 2012-09-22: qty 1

## 2012-09-22 MED ORDER — GLYCOPYRROLATE 0.2 MG/ML IJ SOLN
INTRAMUSCULAR | Status: DC | PRN
  Administered 2012-09-22: .5 mg via INTRAVENOUS

## 2012-09-22 MED ORDER — PANTOPRAZOLE SODIUM 40 MG PO TBEC
40.0000 mg | DELAYED_RELEASE_TABLET | Freq: Every day | ORAL | Status: DC
  Administered 2012-09-23: 40 mg via ORAL
  Filled 2012-09-22: qty 1

## 2012-09-22 MED ORDER — ALBUTEROL SULFATE HFA 108 (90 BASE) MCG/ACT IN AERS
1.0000 | INHALATION_SPRAY | Freq: Four times a day (QID) | RESPIRATORY_TRACT | Status: DC | PRN
  Filled 2012-09-22: qty 6.7

## 2012-09-22 MED ORDER — OXYCODONE HCL 5 MG PO TABS: 5.0000 mg | ORAL_TABLET | Freq: Once | ORAL | Status: DC | PRN

## 2012-09-22 MED ORDER — ALUM & MAG HYDROXIDE-SIMETH 200-200-20 MG/5ML PO SUSP: 15.0000 mL | ORAL | Status: DC | PRN

## 2012-09-22 MED ORDER — NICOTINE 21 MG/24HR TD PT24
21.0000 mg | MEDICATED_PATCH | TRANSDERMAL | Status: DC
  Filled 2012-09-22 (×2): qty 1

## 2012-09-22 MED ORDER — SODIUM CHLORIDE 0.9 % IR SOLN
Status: DC | PRN
  Administered 2012-09-22: 10:00:00

## 2012-09-22 MED ORDER — METOPROLOL TARTRATE 1 MG/ML IV SOLN: 2.0000 mg | INTRAVENOUS | Status: DC | PRN

## 2012-09-22 MED ORDER — PHENOL 1.4 % MT LIQD: 1.0000 | OROMUCOSAL | Status: DC | PRN

## 2012-09-22 MED ORDER — ROCURONIUM BROMIDE 100 MG/10ML IV SOLN
INTRAVENOUS | Status: DC | PRN
  Administered 2012-09-22: 50 mg via INTRAVENOUS

## 2012-09-22 MED ORDER — METOPROLOL TARTRATE 25 MG PO TABS
25.0000 mg | ORAL_TABLET | Freq: Two times a day (BID) | ORAL | Status: DC
  Administered 2012-09-22 – 2012-09-23 (×2): 25 mg via ORAL
  Filled 2012-09-22 (×3): qty 1

## 2012-09-22 MED ORDER — SIMVASTATIN 20 MG PO TABS: 20.0000 mg | ORAL_TABLET | Freq: Two times a day (BID) | ORAL | Status: DC

## 2012-09-22 MED ORDER — NEOSTIGMINE METHYLSULFATE 1 MG/ML IJ SOLN
INTRAMUSCULAR | Status: DC | PRN
  Administered 2012-09-22: 4 mg via INTRAVENOUS

## 2012-09-22 MED ORDER — MORPHINE SULFATE 2 MG/ML IJ SOLN
INTRAMUSCULAR | Status: AC
  Administered 2012-09-22: 2 mg via INTRAMUSCULAR
  Filled 2012-09-22: qty 1

## 2012-09-22 MED ORDER — ALLOPURINOL 100 MG PO TABS
100.0000 mg | ORAL_TABLET | Freq: Every day | ORAL | Status: DC
  Administered 2012-09-22 – 2012-09-23 (×2): 100 mg via ORAL
  Filled 2012-09-22 (×2): qty 1

## 2012-09-22 MED ORDER — ONDANSETRON HCL 4 MG/2ML IJ SOLN: 4.0000 mg | Freq: Once | INTRAMUSCULAR | Status: DC | PRN

## 2012-09-22 MED ORDER — MORPHINE SULFATE 2 MG/ML IJ SOLN
2.0000 mg | INTRAMUSCULAR | Status: DC | PRN
  Administered 2012-09-22 (×2): 4 mg via INTRAVENOUS
  Filled 2012-09-22: qty 2

## 2012-09-22 MED ORDER — 0.9 % SODIUM CHLORIDE (POUR BTL) OPTIME
TOPICAL | Status: DC | PRN
  Administered 2012-09-22: 1000 mL

## 2012-09-22 MED ORDER — SENNOSIDES-DOCUSATE SODIUM 8.6-50 MG PO TABS
1.0000 | ORAL_TABLET | Freq: Every evening | ORAL | Status: DC | PRN
  Filled 2012-09-22: qty 1

## 2012-09-22 MED ORDER — PROPOFOL 10 MG/ML IV BOLUS
INTRAVENOUS | Status: DC | PRN
  Administered 2012-09-22: 200 mg via INTRAVENOUS

## 2012-09-22 MED ORDER — GUAIFENESIN-DM 100-10 MG/5ML PO SYRP: 15.0000 mL | ORAL_SOLUTION | ORAL | Status: DC | PRN

## 2012-09-22 MED ORDER — MORPHINE SULFATE 2 MG/ML IJ SOLN
INTRAMUSCULAR | Status: AC
  Filled 2012-09-22: qty 1

## 2012-09-22 MED ORDER — SODIUM CHLORIDE 0.9 % IV SOLN: 500.0000 mL | Freq: Once | INTRAVENOUS | Status: AC | PRN

## 2012-09-22 MED ORDER — ONDANSETRON HCL 4 MG/2ML IJ SOLN
INTRAMUSCULAR | Status: DC | PRN
  Administered 2012-09-22: 4 mg via INTRAVENOUS

## 2012-09-22 MED ORDER — BISACODYL 10 MG RE SUPP: 10.0000 mg | Freq: Every day | RECTAL | Status: DC | PRN

## 2012-09-22 MED ORDER — LIDOCAINE HCL (CARDIAC) 20 MG/ML IV SOLN
INTRAVENOUS | Status: DC | PRN
  Administered 2012-09-22: 100 mg via INTRAVENOUS

## 2012-09-22 MED ORDER — FENTANYL CITRATE 0.05 MG/ML IJ SOLN
INTRAMUSCULAR | Status: DC | PRN
  Administered 2012-09-22 (×2): 100 ug via INTRAVENOUS
  Administered 2012-09-22: 50 ug via INTRAVENOUS

## 2012-09-22 MED ORDER — SIMVASTATIN 40 MG PO TABS
40.0000 mg | ORAL_TABLET | Freq: Every day | ORAL | Status: DC
  Filled 2012-09-22: qty 1

## 2012-09-22 MED ORDER — HEPARIN SODIUM (PORCINE) 1000 UNIT/ML IJ SOLN
INTRAMUSCULAR | Status: DC | PRN
  Administered 2012-09-22: 6000 [IU] via INTRAVENOUS

## 2012-09-22 MED ORDER — ONDANSETRON HCL 4 MG/2ML IJ SOLN: 4.0000 mg | Freq: Four times a day (QID) | INTRAMUSCULAR | Status: DC | PRN

## 2012-09-22 MED ORDER — MEPERIDINE HCL 25 MG/ML IJ SOLN: 6.2500 mg | INTRAMUSCULAR | Status: DC | PRN

## 2012-09-22 SURGICAL SUPPLY — 91 items
BAG BANDED W/RUBBER/TAPE 36X54 (MISCELLANEOUS) ×2 IMPLANT
BAG SNAP BAND KOVER 36X36 (MISCELLANEOUS) ×2 IMPLANT
BALLN CODA OCL 2-9.0-35-120-3 (BALLOONS)
BALLOON COD OCL 2-9.0-35-120-3 (BALLOONS) IMPLANT
CANISTER SUCTION 2500CC (MISCELLANEOUS) ×2 IMPLANT
CATH BEACON 5.038 65CM KMP-01 (CATHETERS) ×2 IMPLANT
CATH OMNI FLUSH .035X70CM (CATHETERS) ×2 IMPLANT
CLIP TI MEDIUM 24 (CLIP) IMPLANT
CLIP TI WIDE RED SMALL 24 (CLIP) IMPLANT
CLOTH BEACON ORANGE TIMEOUT ST (SAFETY) ×2 IMPLANT
COVER DOME SNAP 22 D (MISCELLANEOUS) ×2 IMPLANT
COVER MAYO STAND STRL (DRAPES) ×2 IMPLANT
COVER PROBE W GEL 5X96 (DRAPES) ×2 IMPLANT
COVER SURGICAL LIGHT HANDLE (MISCELLANEOUS) ×2 IMPLANT
DERMABOND ADVANCED (GAUZE/BANDAGES/DRESSINGS) ×2
DERMABOND ADVANCED .7 DNX12 (GAUZE/BANDAGES/DRESSINGS) ×2 IMPLANT
DEVICE CLOSURE PERCLS PRGLD 6F (VASCULAR PRODUCTS) ×4 IMPLANT
DRAIN CHANNEL 10F 3/8 F FF (DRAIN) IMPLANT
DRAIN CHANNEL 10M FLAT 3/4 FLT (DRAIN) IMPLANT
DRAPE INCISE IOBAN 66X45 STRL (DRAPES) ×4 IMPLANT
DRAPE TABLE COVER HEAVY DUTY (DRAPES) ×2 IMPLANT
DRESSING OPSITE X SMALL 2X3 (GAUZE/BANDAGES/DRESSINGS) ×4 IMPLANT
DRYSEAL FLEXSHEATH 12FR 33CM (SHEATH) ×1
DRYSEAL FLEXSHEATH 18FR 33CM (SHEATH) ×1
ELECT CAUTERY BLADE 6.4 (BLADE) IMPLANT
ELECT REM PT RETURN 9FT ADLT (ELECTROSURGICAL) ×4
ELECTRODE REM PT RTRN 9FT ADLT (ELECTROSURGICAL) ×2 IMPLANT
EVACUATOR 3/16  PVC DRAIN (DRAIN)
EVACUATOR 3/16 PVC DRAIN (DRAIN) IMPLANT
EVACUATOR SILICONE 100CC (DRAIN) IMPLANT
EXCLUDER TNK LEG 35MX14X16 (Endovascular Graft) ×1 IMPLANT
EXCLUDER TRUNK LEG 35MX14X16 (Endovascular Graft) ×2 IMPLANT
GAUZE SPONGE 2X2 8PLY STRL LF (GAUZE/BANDAGES/DRESSINGS) ×1 IMPLANT
GLOVE BIOGEL PI IND STRL 6.5 (GLOVE) ×1 IMPLANT
GLOVE BIOGEL PI IND STRL 7.0 (GLOVE) ×1 IMPLANT
GLOVE BIOGEL PI IND STRL 7.5 (GLOVE) ×2 IMPLANT
GLOVE BIOGEL PI INDICATOR 6.5 (GLOVE) ×1
GLOVE BIOGEL PI INDICATOR 7.0 (GLOVE) ×1
GLOVE BIOGEL PI INDICATOR 7.5 (GLOVE) ×2
GLOVE SS BIOGEL STRL SZ 7 (GLOVE) ×1 IMPLANT
GLOVE SS BIOGEL STRL SZ 7.5 (GLOVE) ×1 IMPLANT
GLOVE SUPERSENSE BIOGEL SZ 7 (GLOVE) ×1
GLOVE SUPERSENSE BIOGEL SZ 7.5 (GLOVE) ×1
GLOVE SURG SS PI 7.0 STRL IVOR (GLOVE) ×2 IMPLANT
GLOVE SURG SS PI 7.5 STRL IVOR (GLOVE) ×2 IMPLANT
GOWN PREVENTION PLUS XLARGE (GOWN DISPOSABLE) ×2 IMPLANT
GOWN PREVENTION PLUS XXLARGE (GOWN DISPOSABLE) ×2 IMPLANT
GOWN STRL NON-REIN LRG LVL3 (GOWN DISPOSABLE) ×4 IMPLANT
GOWN STRL REIN XL XLG (GOWN DISPOSABLE) ×2 IMPLANT
GRAFT BALLN CATH 65CM (STENTS) ×1 IMPLANT
INSERT FOGARTY 61MM (MISCELLANEOUS) ×2 IMPLANT
INSERT FOGARTY SM (MISCELLANEOUS) ×4 IMPLANT
KIT BASIN OR (CUSTOM PROCEDURE TRAY) ×2 IMPLANT
KIT ROOM TURNOVER OR (KITS) ×2 IMPLANT
LEG CONTRALATERAL 18X11.5 (Endovascular Graft) ×2 IMPLANT
NEEDLE PERC 18GX7CM (NEEDLE) ×2 IMPLANT
NS IRRIG 1000ML POUR BTL (IV SOLUTION) ×2 IMPLANT
PACK AORTA (CUSTOM PROCEDURE TRAY) ×2 IMPLANT
PAD ARMBOARD 7.5X6 YLW CONV (MISCELLANEOUS) ×4 IMPLANT
PENCIL BUTTON HOLSTER BLD 10FT (ELECTRODE) IMPLANT
PERCLOSE PROGLIDE 6F (VASCULAR PRODUCTS) ×8
PROTECTION STATION PRESSURIZED (MISCELLANEOUS) ×2
SHEATH AVANTI 11CM 8FR (MISCELLANEOUS) ×2 IMPLANT
SHEATH BRITE TIP 8FR 23CM (MISCELLANEOUS) ×2 IMPLANT
SHEATH DRYSEAL FLEX 12FR 33CM (SHEATH) ×1 IMPLANT
SHEATH DRYSEAL FLEX 18FR 33CM (SHEATH) ×1 IMPLANT
SPONGE GAUZE 2X2 STER 10/PKG (GAUZE/BANDAGES/DRESSINGS) ×1
STATION PROTECTION PRESSURIZED (MISCELLANEOUS) ×1 IMPLANT
STENT GRAFT BALLN CATH 65CM (STENTS) ×1
STOPCOCK MORSE 400PSI 3WAY (MISCELLANEOUS) ×2 IMPLANT
SUT ETHILON 3 0 PS 1 (SUTURE) IMPLANT
SUT PROLENE 5 0 C 1 24 (SUTURE) IMPLANT
SUT PROLENE 5 0 CC 1 (SUTURE) IMPLANT
SUT PROLENE 6 0 C 1 30 (SUTURE) IMPLANT
SUT VIC AB 2-0 CT1 27 (SUTURE)
SUT VIC AB 2-0 CT1 TAPERPNT 27 (SUTURE) IMPLANT
SUT VIC AB 3-0 SH 27 (SUTURE)
SUT VIC AB 3-0 SH 27X BRD (SUTURE) IMPLANT
SUT VICRYL 4-0 PS2 18IN ABS (SUTURE) ×4 IMPLANT
SYR 20CC LL (SYRINGE) ×4 IMPLANT
SYR 30ML LL (SYRINGE) IMPLANT
SYR 5ML LL (SYRINGE) IMPLANT
SYR MEDRAD MARK V 150ML (SYRINGE) ×2 IMPLANT
SYRINGE 10CC LL (SYRINGE) ×4 IMPLANT
TOWEL OR 17X24 6PK STRL BLUE (TOWEL DISPOSABLE) ×8 IMPLANT
TOWEL OR 17X26 10 PK STRL BLUE (TOWEL DISPOSABLE) ×2 IMPLANT
TRAY FOLEY CATH 14FRSI W/METER (CATHETERS) ×2 IMPLANT
TUBING HIGH PRESSURE 120CM (CONNECTOR) ×2 IMPLANT
WATER STERILE IRR 1000ML POUR (IV SOLUTION) IMPLANT
WIRE AMPLATZ SS-J .035X180CM (WIRE) ×4 IMPLANT
WIRE BENTSON .035X145CM (WIRE) ×4 IMPLANT

## 2012-09-22 NOTE — Progress Notes (Signed)
Held Beta blocker d/t heart rate

## 2012-09-22 NOTE — Progress Notes (Signed)
Pt arrived from PACU, VSS, bilat groins level 0, pulses palp, will continue to monitor.

## 2012-09-22 NOTE — Progress Notes (Signed)
Utilization review completed.  

## 2012-09-22 NOTE — Anesthesia Postprocedure Evaluation (Signed)
Anesthesia Post Note  Patient: Aaron [name]  Procedure(s) Performed: Procedure(s) (LRB): ABDOMINAL AORTIC ENDOVASCULAR STENT GRAFT (N/A)  Anesthesia type: general  Patient location: PACU  Post pain: Pain level controlled  Post assessment: Patient's Cardiovascular Status Stable  Last Vitals:  Filed Vitals:   09/22/12 1230  BP: 136/81  Pulse: 52  Temp:   Resp: 14    Post vital signs: Reviewed and stable  Level of consciousness: sedated  Complications: No apparent anesthesia complications

## 2012-09-22 NOTE — H&P (View-Only) (Signed)
Subjective:     Patient ID: Aaron [name], male   DOB: 10/16/1956, 56 y.o.   MRN: [id number]  HPI this 56-year-old male is referred by the any pain in the emergency department for a recently discovered abdominal aortic aneurysm. The patient reported to the emergency room last Wednesday with some lower right back discomfort which was musculoskeletal in nature. Over the past 3 days that has now resolved. CT scan revealed a 5.1 cm infrarenal abdominal aortic aneurysm with no evidence of leakage. Patient had no prior knowledge of aneurysm and has no family history of aneurysms. He is a chronic smoker with greater than 60 pack years.  Past Medical History  Diagnosis Date  . Hyperlipidemia   . HTN (hypertension)   . Gout   . Rash 2011    left chest, biopsy 2012 Dr. Mcconnell, results pending  . Dyspnea     normal PFT April 2012  . Tobacco abuse   . Asthma     History  Substance Use Topics  . Smoking status: Current Some Day Smoker -- 0.2 packs/day for 52.5 years    Types: Cigarettes  . Smokeless tobacco: Current User    Types: Snuff   Comment: started patch on 09/02/2012 has only smoked 3 since then  . Alcohol Use: No     past ETOH use quit 11-26-2009    Family History  Problem Relation Age of Onset  . Heart attack Father 74  . COPD Father   . Heart disease Father   . Asthma Daughter     No Known Allergies  Current outpatient prescriptions:Albuterol Sulfate (PROAIR HFA IN), Inhale 2 puffs into the lungs every 6 (six) hours., Disp: , Rfl: ;  allopurinol (ZYLOPRIM) 100 MG tablet, Take 100 mg by mouth daily. , Disp: , Rfl: ;  Fluticasone-Salmeterol (ADVAIR DISKUS) 250-50 MCG/DOSE AEPB, Inhale 1 puff into the lungs every 6 (six) hours as needed. , Disp: , Rfl:  HYDROcodone-acetaminophen (NORCO/VICODIN) 5-325 MG per tablet, Take 1 tablet by mouth every 4 (four) hours as needed for pain., Disp: 20 tablet, Rfl: 0;  pantoprazole (PROTONIX) 40 MG tablet, Take 40 mg by mouth daily. , Disp: ,  Rfl: ;  simvastatin (ZOCOR) 10 MG tablet, Take 10 mg by mouth daily. , Disp: , Rfl:  guaiFENesin-dextromethorphan (ROBITUSSIN DM) 100-10 MG/5ML syrup, Take 5 mLs by mouth 3 (three) times daily as needed. Cough., Disp: , Rfl:   BP 132/94  Pulse 81  Ht 6' 2" (1.88 m)  Wt 216 lb (97.977 kg)  BMI 27.73 kg/m2  SpO2 96%  Body mass index is 27.73 kg/(m^2).           Review of Systems denies chest pain but does have a history of productive cough and asthma and is treated with daily inhaler. No history of dyspnea on exertion-PND, orthopnea, hemoptysis, claudication. Also history of urinary frequency recently with negative workup by his medical doctor. Other systems negative and complete review of systems     Objective:   Physical Exam blood pressure 130/94 heart rate 81 respirations 18 Gen.-alert and oriented x3 in no apparent distress HEENT normal for age Lungs no rhonchi or wheezing Cardiovascular regular rhythm no murmurs carotid pulses 3+ palpable no bruits audible Abdomen soft nontender no palpable masses Musculoskeletal free of  major deformities Skin clear -no rashes Neurologic normal Lower extremities 3+ femoral and dorsalis pedis pulses palpable bilaterally with no edema  Today I reviewed the CT scan without contrast obtained at [facility]    emergency room last week. Patient does have a 5.1 cm infrarenal aortic aneurysm with an adequate infrarenal neck for aortic stent grafting.     Assessment:     Infrarenal abdominal aortic aneurysm-5.1 cm maximum diameter No history coronary artery disease    Plan:     #1 obtain Cardiolite to rule out silent coronary artery disease #2 emphasized importance of discontinuing smoking which he didn't one week ago but does have 30-40 year history of 2 packs per day #3 planned aortic stent graft-Gore-on Wednesday, October 30 Risks and benefits of staying graft including development of endoleak and need for further intervention as well as  open repair were all discussed and patient would like to proceed      

## 2012-09-22 NOTE — Anesthesia Preprocedure Evaluation (Addendum)
Anesthesia Evaluation  Patient identified by MRN, date of birth, ID band Patient awake    Reviewed: Allergy & Precautions, H&P , NPO status , Patient's Chart, lab work & pertinent test results  History of Anesthesia Complications Negative for: history of anesthetic complications  Airway Mallampati: I TM Distance: >3 FB Neck ROM: Full    Dental  (+) Dental Advisory Given and Teeth Intact   Pulmonary shortness of breath and with exertion, asthma , Current Smoker, former smoker,    Pulmonary exam normal       Cardiovascular hypertension, Pt. on medications Rhythm:Regular Rate:Bradycardia     Neuro/Psych    GI/Hepatic Neg liver ROS, hiatal hernia, GERD-  Medicated and Controlled,  Endo/Other  negative endocrine ROS  Renal/GU negative Renal ROS     Musculoskeletal   Abdominal Normal abdominal exam  (+)  Abdomen: soft.    Peds  Hematology negative hematology ROS (+)   Anesthesia Other Findings   Reproductive/Obstetrics                          Anesthesia Physical Anesthesia Plan  ASA: III  Anesthesia Plan: General   Post-op Pain Management:    Induction: Intravenous  Airway Management Planned: Oral ETT  Additional Equipment: Arterial line, CVP and Ultrasound Guidance Line Placement  Intra-op Plan:   Post-operative Plan: Extubation in OR  Informed Consent: I have reviewed the patients History and Physical, chart, labs and discussed the procedure including the risks, benefits and alternatives for the proposed anesthesia with the patient or authorized representative who has indicated his/her understanding and acceptance.   Dental advisory given  Plan Discussed with: CRNA and Surgeon  Anesthesia Plan Comments:        Anesthesia Quick Evaluation

## 2012-09-22 NOTE — Interval H&P Note (Signed)
History and Physical Interval Note:  09/22/2012 8:07 AM  Aaron [name]  has presented today for surgery, with the diagnosis of AAA  The various methods of treatment have been discussed with the patient and family. After consideration of risks, benefits and other options for treatment, the patient has consented to  Procedure(s) (LRB) with comments: ABDOMINAL AORTIC ENDOVASCULAR STENT GRAFT (N/A) - GORE as a surgical intervention .  The patient's history has been reviewed, patient examined, no change in status, stable for surgery.  I have reviewed the patient's chart and labs.  Questions were answered to the patient's satisfaction.     Aaron [name]

## 2012-09-22 NOTE — Op Note (Signed)
OPERATIVE REPORT  Date of Surgery: 09/22/2012  Surgeon: Josephina Gip, MD  Assistant: Dr. Durene Cal  Pre-op Diagnosis: Abdominal Aortic Aneurysm  Post-op Diagnosis: Abdominal Aortic Aneurysm  Procedure: Procedure(s): #1 bilateral common femoral artery access using percutaneous approach with ultrasound guidance-Proglyde #2 insertion of aorto by common iliac-Gore C-3 Excluder stent graft using     A-35 x 14 x 16 cm main body via right     B.-18 mm x 11.5 cm contralateral wound-left #3 completion angiogram Anesthesia: General  EBL: 100 cc  Complications: None  Procedure Details: Patient taken to the operating room placed in the supine position at which time satisfactory general endotracheal anesthesia was administered. The abdomen and both inguinal areas were prepped with Betadine scrub and solution draped in a routine sterile manner. Both common femoral arteries were accessed using the SonoSite ultrasound with percutaneous access with 2 Pro-glide devices bilaterally one at the 11:00 position and one at the 1:00 position on each side. A short 8 French sheath was passed over the wire and the right side and a long 8 [place] sheath inserted on the left. The patient was then heparinized. Pigtail catheter was inserted up the left side and a limited angiogram was performed to localize the renal arteries. The left was larger of the 2 renal arteries and there was a long infrarenal neck-4 cm. The main body was inserted to the right side after exchanging the [facility] wire for a Amplatz super stiff wire. An 18 dry seal sheath was passed over the Amplatz wire and the main device which was a 35 mm x 14 mm x 16 cm device was passed through the right side and positioned just distal to the lowest-left renal artery. Graft was deployed and a second angiogram performed-limited to be certain that the position was corrected it was indeed just distal to the left renal artery. Graft was deployed down to the  contralateral gate. Using a Kumpe catheter through the sheath on the left side the gait was cannulated Teena Dunk wire passed into the aorta and this was checked with a pigtail catheter confirming accurate cannulation. This was then exchanged for an Amplatz wire and a 12 French sheath was passed into the right contralateral gate retrograde angiogram was performed revealing the location of the internal iliac artery on the left. An 18 mm-no bottom by 11.5 cm graft was selected and deployed appropriately. Following this the right limb was reexamined and a retrograde sheath a gram performed to localize the right internal iliac. The remainder of the right limb of graft-main body was then deployed landing about 1 cm short of the hypogastric on the right. All junctions were then dilated with 5050 A completion angiogram performed through the pigtail catheter using 20 cc of contrast at 20 cc per second. This revealed excellent position of the graft with no evidence of endoleak's and good flow distally. She is were then removed guidewires left in place while the Pro-glide device was secured on both sides giving excellent hemostasis guidewires removed the patient was given 50 mg of protamine to reverse the heparin. Wounds were closed in layers with Vicryl in a subcuticular fashion there was excellent femoral and dorsalis pedis pulses palpable bilaterally. Patient taken to the recovery room in stable condition  Josephina Gip, MD 09/22/2012 10:30 AM

## 2012-09-22 NOTE — Transfer of Care (Signed)
Immediate Anesthesia Transfer of Care Note  Patient: Aaron [name]  Procedure(s) Performed: Procedure(s) (LRB) with comments: ABDOMINAL AORTIC ENDOVASCULAR STENT GRAFT (N/A) - GORE; ultrasound guided.  Patient Location: PACU  Anesthesia Type:General  Level of Consciousness: awake, alert  and oriented  Airway & Oxygen Therapy: Patient connected to face mask oxygen  Post-op Assessment: Report given to PACU RN, Post -op Vital signs reviewed and stable, Patient moving all extremities and Patient moving all extremities X 4  Post vital signs: Reviewed and stable  Complications: No apparent anesthesia complications

## 2012-09-23 ENCOUNTER — Encounter (HOSPITAL_COMMUNITY): Payer: Self-pay | Admitting: Vascular Surgery

## 2012-09-23 ENCOUNTER — Telehealth: Payer: Self-pay | Admitting: Vascular Surgery

## 2012-09-23 ENCOUNTER — Other Ambulatory Visit: Payer: Self-pay | Admitting: *Deleted

## 2012-09-23 DIAGNOSIS — I714 Abdominal aortic aneurysm, without rupture, unspecified: Secondary | ICD-10-CM

## 2012-09-23 DIAGNOSIS — Z48812 Encounter for surgical aftercare following surgery on the circulatory system: Secondary | ICD-10-CM

## 2012-09-23 LAB — TYPE AND SCREEN
Antibody Screen: NEGATIVE
Unit division: 0

## 2012-09-23 LAB — BASIC METABOLIC PANEL
CO2: 27 mEq/L (ref 19–32)
Calcium: 9.1 mg/dL (ref 8.4–10.5)
Creatinine, Ser: 0.9 mg/dL (ref 0.50–1.35)
GFR calc non Af Amer: >90 mL/min (ref >90)
Sodium: 139 mEq/L (ref 135–145)

## 2012-09-23 LAB — CBC
MCH: 30.4 pg (ref 26.0–34.0)
MCHC: 34.4 g/dL (ref 30.0–36.0)
MCV: 88.5 fL (ref 78.0–100.0)
Platelets: 231 10*3/uL (ref 150–400)
RBC: 4.27 MIL/uL (ref 4.22–5.81)

## 2012-09-23 MED ORDER — SODIUM CHLORIDE 0.9 % IJ SOLN
INTRAMUSCULAR | Status: AC
  Filled 2012-09-23: qty 20

## 2012-09-23 MED ORDER — OXYCODONE-ACETAMINOPHEN 5-325 MG PO TABS: 1.0000 | ORAL_TABLET | ORAL | Status: DC | PRN

## 2012-09-23 NOTE — Discharge Summary (Signed)
[facility] Discharge Summary   Patient ID:  Aaron [name] MRN: [id number] DOB/AGE: 54-Nov-1957 54 y.o.  Admit date: 09/22/2012 Discharge date: 09/23/2012 Date of Surgery: 09/22/2012 Surgeon: Surgeon(s): Aaron Ochoa, MD Aaron Libman, MD  Admission Diagnosis: AAA  Discharge Diagnoses:  AAA  Secondary Diagnoses: Past Medical History  Diagnosis Date  . Hyperlipidemia   . Gout   . Rash 2011    left chest, biopsy 2012 Aaron [name], results pending  . Dyspnea     normal PFT April 2012  . Tobacco abuse   . Pneumonia     hx of and hx of bronchitis  . GERD (gastroesophageal reflux disease)   . H/O hiatal hernia   . Arthritis     lower back  . Asthma     followed by Dr. Samara [name]  . HTN (hypertension)     hx of sees Dr. Alonna [name]  . Abdominal aortic aneurysm     sees Dr. Jacinto [name] for cardiac f/u, [id number]    Procedure(s): ABDOMINAL AORTIC ENDOVASCULAR STENT GRAFT  Discharged Condition: good  HPI:  This 56 year old male is referred by the any pain in the emergency department for a recently discovered abdominal aortic aneurysm. The patient reported to the emergency room last Wednesday with some lower right back discomfort which was musculoskeletal in nature. Over the past 3 days that has now resolved. CT scan revealed a 5.1 cm infrarenal abdominal aortic aneurysm with no evidence of leakage. Patient had no prior knowledge of aneurysm and has no family history of aneurysms. He is a chronic smoker with greater than 60 pack years.   Hospital Course:  Aaron [name] is a 56 y.o. male is S/P Bilateral groin access incision Procedure(s): ABDOMINAL AORTIC ENDOVASCULAR STENT GRAFT Extubated: POD # 0 Post-op wounds clean, dry, intact or healing well Pt. Ambulating, voiding and taking PO diet without difficulty. Pt pain controlled with PO pain meds. Labs as below Complications:none  Consults:     Significant Diagnostic Studies: CBC Lab  Results  Component Value Date   WBC 14.8* 09/23/2012   HGB 13.0 09/23/2012   HCT 37.8* 09/23/2012   MCV 88.5 09/23/2012   PLT 231 09/23/2012    BMET    Component Value Date/Time   NA 139 09/23/2012 0435   K 4.0 09/23/2012 0435   CL 104 09/23/2012 0435   CO2 27 09/23/2012 0435   GLUCOSE 129* 09/23/2012 0435   BUN 13 09/23/2012 0435   CREATININE 0.90 09/23/2012 0435   CALCIUM 9.1 09/23/2012 0435   GFRNONAA >90 09/23/2012 0435   GFRAA >90 09/23/2012 0435   COAG Lab Results  Component Value Date   INR 1.16 09/22/2012   INR 1.02 09/14/2012     Disposition:  Discharge to :Home   Aaron [name], Aaron [name]  Home Medication Instructions [id number]   Printed on:09/23/12 0752  Medication Information                    allopurinol (ZYLOPRIM) 100 MG tablet Take 100 mg by mouth daily.            pantoprazole (PROTONIX) 40 MG tablet Take 40 mg by mouth every morning.            Fluticasone-Salmeterol (ADVAIR DISKUS) 250-50 MCG/DOSE AEPB Inhale 1 puff into the lungs 2 (two) times daily.            albuterol (PROVENTIL HFA;VENTOLIN HFA) 108 (90 BASE) MCG/ACT inhaler Inhale 2 puffs into the lungs every  6 (six) hours as needed. For cough and wheezing.           HYDROcodone-acetaminophen (NORCO/VICODIN) 5-325 MG per tablet Take 1 tablet by mouth every 6 (six) hours as needed. For back pain.           nicotine (NICODERM CQ - DOSED IN MG/24 HOURS) 21 mg/24hr patch Place 1 patch onto the skin daily.           metoprolol tartrate (LOPRESSOR) 25 MG tablet Take 25 mg by mouth 2 (two) times daily.           simvastatin (ZOCOR) 40 MG tablet Take 40 mg by mouth at bedtime.            Verbal and written Discharge instructions given to the patient. Wound care per Discharge AVS   Signed: Clinton [name] [facility] 09/23/2012, 7:52 AM

## 2012-09-23 NOTE — Telephone Encounter (Addendum)
Message copied by Rosalyn Charters on Thu Sep 23, 2012  1:07 PM ------      Message from: [place], [place] K      Created: Thu Sep 23, 2012 12:46 PM      Regarding: schedule                   ----- Message -----         From: Melene Plan, RN         Sent: 09/23/2012  11:55 AM           To: Vallarie Mare, LPN, #                        ----- Message -----         From: Lars Mage, PA         Sent: 09/23/2012   8:10 AM           To: Melene Plan, RN            F/U in 4 weeks needs CT set up prior to visit. Thanks mc EVAR.  Dr. Hart Rochester.  notified patient of cta and ov on 10-26-12 cta at 11:15 and jdl 12:30

## 2012-09-23 NOTE — Progress Notes (Signed)
[facility] [place]  Post-op EVAR Date of Surgery: 09/22/2012 Surgeon: Surgeon(s): Pryor Ochoa, MD Nada Libman, MD POD: 1 Day Post-Op Device:   History of Present Illness  Aaron [name] is a 56 y.o. male who is s/p EVAR. The patient denies back pain; denies abdominal pain; denies lower extremity pain.  He is Ambulating and taking PO well without nausea or vomiting. Pt. has voided with foley out  IMAGING: Dg Chest Portable 1 View  09/22/2012  *RADIOLOGY REPORT*  Clinical Data: Central line placement  PORTABLE CHEST - 1 VIEW  Comparison: None.  Findings: Placement of right jugular catheter with the tip in the right innominate vein.   No pneumothorax.  Interval development of pulmonary edema bilaterally.  No significant pleural effusion.  IMPRESSION: Right jugular catheter in the right innominate vein.  No pneumothorax.  Pulmonary edema.   Original Report Authenticated By: Camelia Phenes, M.D.    Dg Abd Portable 1v  09/22/2012  *RADIOLOGY REPORT*  Clinical Data: Postop stent placement  PORTABLE ABDOMEN - 1 VIEW  Comparison: CT 09/01/2012  Findings: Aorta iliac stent graft in satisfactory position.  Contrast excretion by both kidneys without obstruction.  There is a Foley catheter in the bladder.  No acute bony abnormality.  IMPRESSION: Satisfactory aorta iliac stent graft placement.   Original Report Authenticated By: Camelia Phenes, M.D.     Significant Diagnostic Studies: CBC Lab Results  Component Value Date   WBC 14.8* 09/23/2012   HGB 13.0 09/23/2012   HCT 37.8* 09/23/2012   MCV 88.5 09/23/2012   PLT 231 09/23/2012     BMET    Component Value Date/Time   NA 139 09/23/2012 0435   K 4.0 09/23/2012 0435   CL 104 09/23/2012 0435   CO2 27 09/23/2012 0435   GLUCOSE 129* 09/23/2012 0435   BUN 13 09/23/2012 0435   CREATININE 0.90 09/23/2012 0435   CALCIUM 9.1 09/23/2012 0435   GFRNONAA >90 09/23/2012 0435   GFRAA >90 09/23/2012 0435     COAG Lab Results  Component Value Date   INR 1.16 09/22/2012   INR 1.02 09/14/2012   No results found for this basename: PTT     I/O last 3 completed shifts: In: 2420 [P.O.:620; I.V.:1800] Out: 3050 [Urine:3050] Patient Vitals for the past 24 hrs:  Urine Occurrence  09/23/12 0527 1     Physical Examination  BP Readings from Last 3 Encounters:  09/23/12 142/81  09/23/12 142/81  09/20/12 112/82   Temp Readings from Last 3 Encounters:  09/23/12 97.5 F (36.4 C) Oral  09/23/12 97.5 F (36.4 C) Oral  09/20/12 98.2 F (36.8 C) Oral   SpO2 Readings from Last 3 Encounters:  09/23/12 98%  09/23/12 98%  09/20/12 96%   Pulse Readings from Last 3 Encounters:  09/23/12 75  09/23/12 75  09/20/12 62    General: A&O x 3, WDWN male in NAD Gait: Normal Pulmonary: normal non-labored breathing  Cardiac: RRR Abdomen: soft, NT, NABS Bilateral groin wounds: clean, dry, intact, without hematoma Vascular Exam/Pulses:palpable DP/PT pulses bilateral LE  Extremities without ischemic changes, no Gangrene , no cellulitis; no open wounds;   Neurologic: A&O X 3; Appropriate Affect  Assessment: Aaron [name] is a 56 y.o. male who is 1 Day Post-Op EVAR.  Pt is doing well with no complaints  Plan: Home  The importance of surveillance of the endograft was discussed with the patient  A CTA of abdomen and pelvis will  be scheduled for one month to assess for endoleak.  The patient will follow up with [place] in one month with these studies.   SignedMosetta Pigeon [phone] 09/23/2012 7:50 AM.

## 2012-09-23 NOTE — Progress Notes (Signed)
Patient being discharged via wheelchair to home. All IV's have been D/C. Reviewed all discharge instructions with patient and wife. No questions or concerns voiced at this time.

## 2012-10-05 ENCOUNTER — Telehealth: Payer: Self-pay

## 2012-10-05 NOTE — Telephone Encounter (Signed)
Pt. called to inquire when he could return to work.  States he works in a [employer] and does a lot of lifting and pulling.  He does not return for follow-up appt. until 10/26/12.  Per Dr. Hart Rochester, pt. can return to work, now,  but should not lift greater than 25 #.   Pt. plans to return to work on 10/07/12.  Phone call to pt. and informed of recommendation per Dr. Hart Rochester.  Pt. advised to call office if any problems/symptoms arise.  Verb. Understanding.

## 2012-10-25 ENCOUNTER — Encounter: Payer: Self-pay | Admitting: Vascular Surgery

## 2012-10-26 ENCOUNTER — Ambulatory Visit
Admission: RE | Admit: 2012-10-26 | Discharge: 2012-10-26 | Disposition: A | Discharge status: Home / Self Care | Payer: 59 | Source: Ambulatory Visit | Attending: Vascular Surgery | Admitting: Vascular Surgery

## 2012-10-26 ENCOUNTER — Ambulatory Visit (INDEPENDENT_AMBULATORY_CARE_PROVIDER_SITE_OTHER): Payer: 59 | Admitting: Vascular Surgery

## 2012-10-26 ENCOUNTER — Encounter: Payer: Self-pay | Admitting: Vascular Surgery

## 2012-10-26 VITALS — BP 125/88 | HR 61 | Resp 18 | Ht 73.0 in | Wt 215.0 lb

## 2012-10-26 DIAGNOSIS — I714 Abdominal aortic aneurysm, without rupture, unspecified: Secondary | ICD-10-CM

## 2012-10-26 DIAGNOSIS — Z48812 Encounter for surgical aftercare following surgery on the circulatory system: Secondary | ICD-10-CM

## 2012-10-26 DIAGNOSIS — I719 Aortic aneurysm of unspecified site, without rupture: Secondary | ICD-10-CM

## 2012-10-26 IMAGING — CT CT CTA ABD/PEL W/CM AND/OR W/O CM
1 of 6 series · 13 of 46 positions shown, 19 images · IV contrast ([ID] [id number])
Comparison: Preoperative unenhanced CT on [DATE].

CLINICAL DATA: Status LEATH on [DATE] to treat abdominal
aortic aneurysm.

CT ANGIOGRAPHY ABDOMEN AND PELVIS
TECHNIQUE: Multidetector CT imaging of the abdomen and pelvis was
performed using the standard protocol during bolus administration
of intravenous contrast.  Multiplanar reconstructed images
including MIPs were obtained and reviewed to evaluate the vascular
anatomy.
Contrast: 100mL OMNIPAQUE IOHEXOL 350 MG/ML SOLN

[Series 602: sagittal body · sagittal · 1.03mm/px · 13 of 161 slices shown, 19 images]
[im 11/161  soft-tissue]
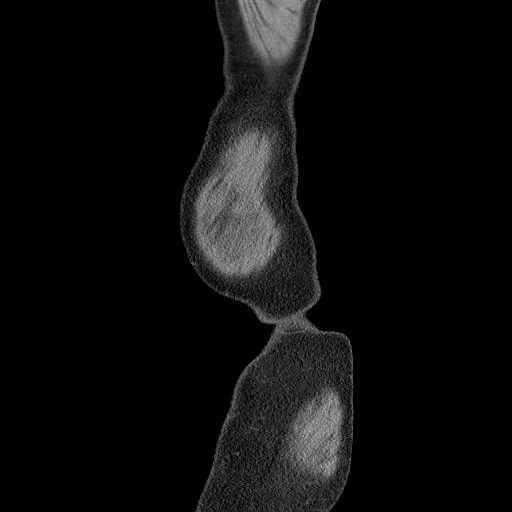
[im 11/161  lung]
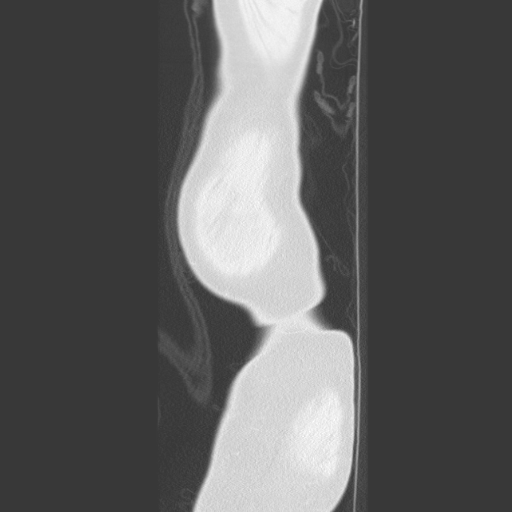
[im 11/161  bone]
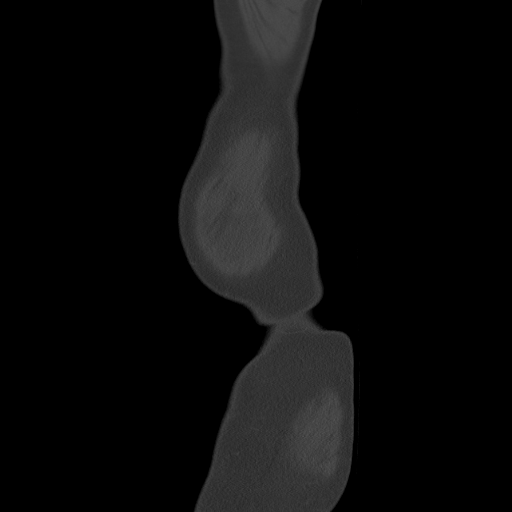
[im 22/161  soft-tissue]
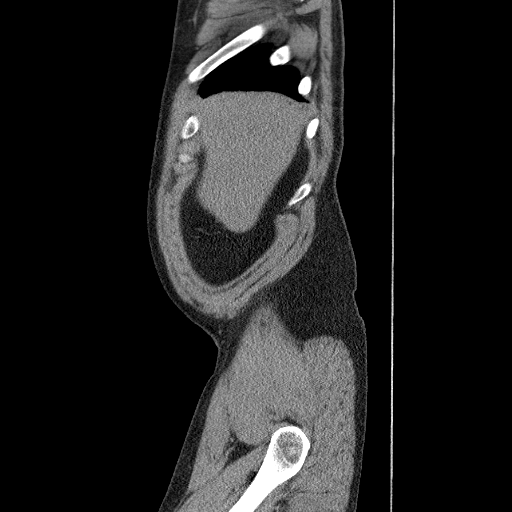
[im 22/161  lung]
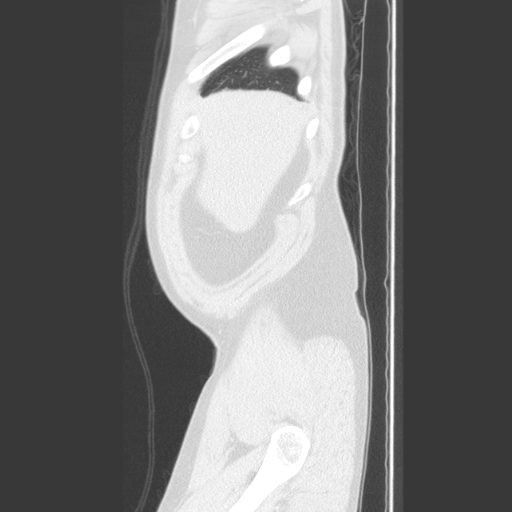
[im 33/161  soft-tissue]
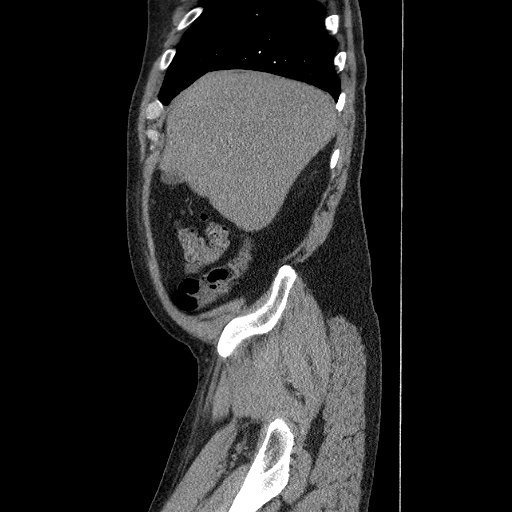
[im 33/161  lung]
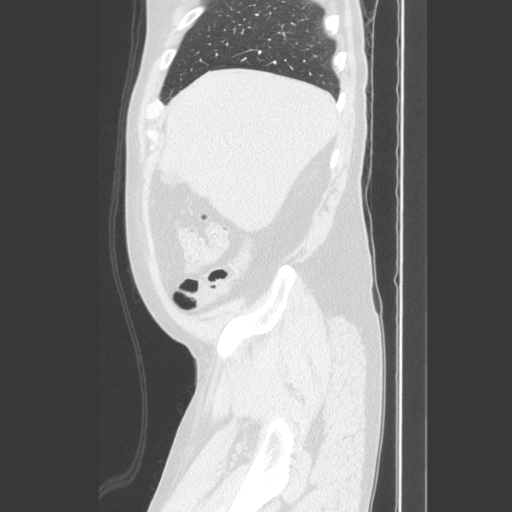
[im 43/161  soft-tissue]
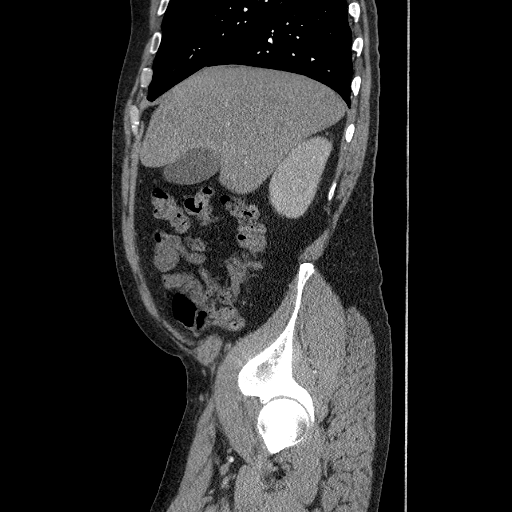
[im 43/161  lung]
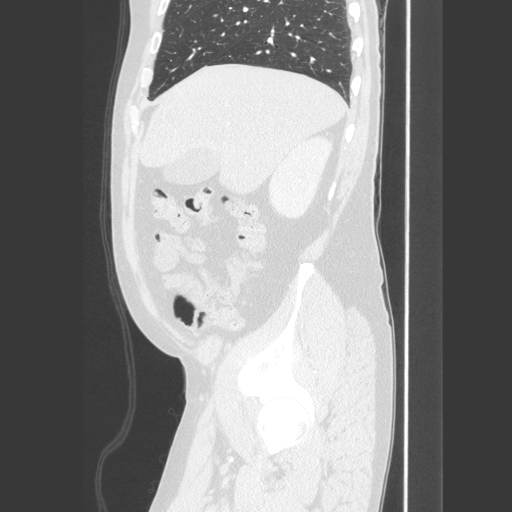
[im 54/161  soft-tissue]
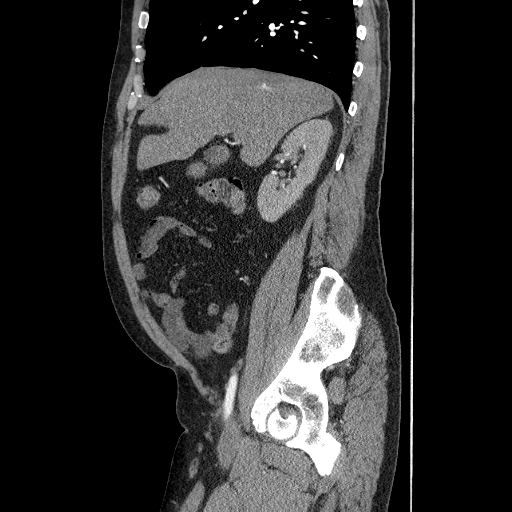
[im 65/161  soft-tissue]
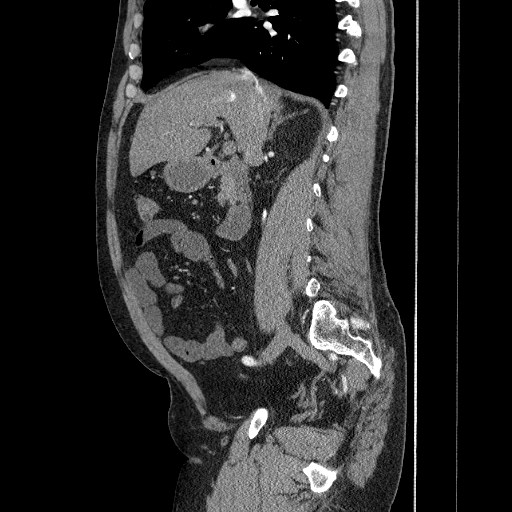
[im 86/161  soft-tissue]
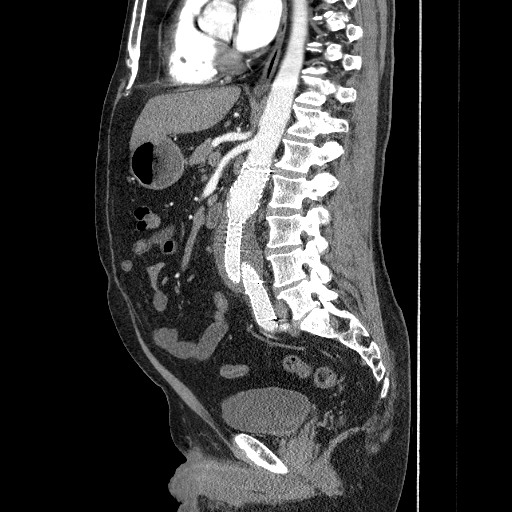
[im 97/161  soft-tissue]
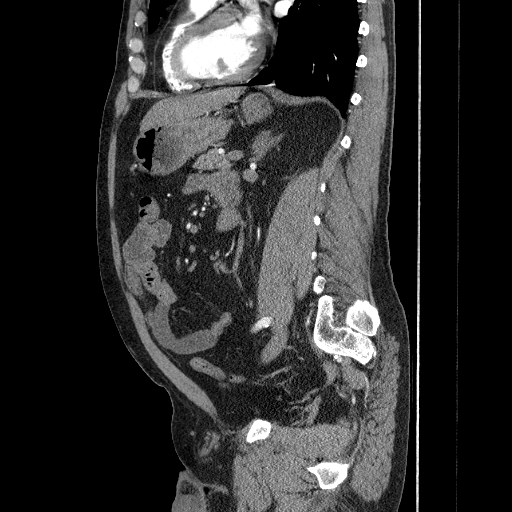
[im 107/161  soft-tissue]
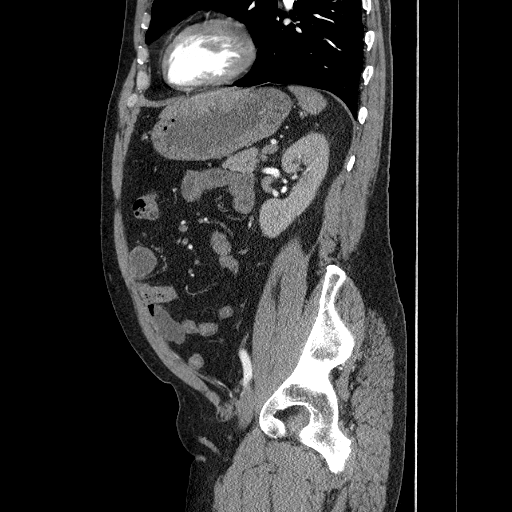
[im 107/161  bone]
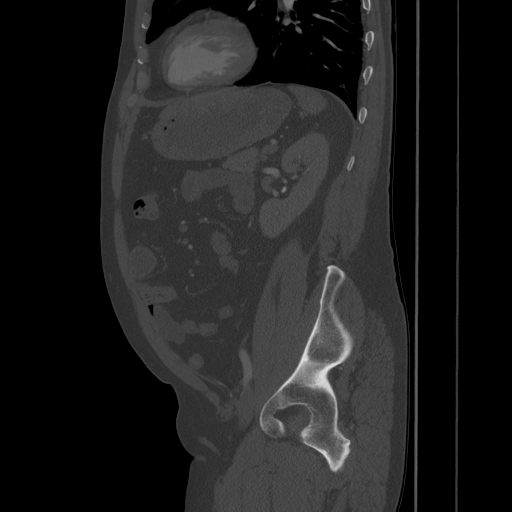
[im 118/161  soft-tissue]
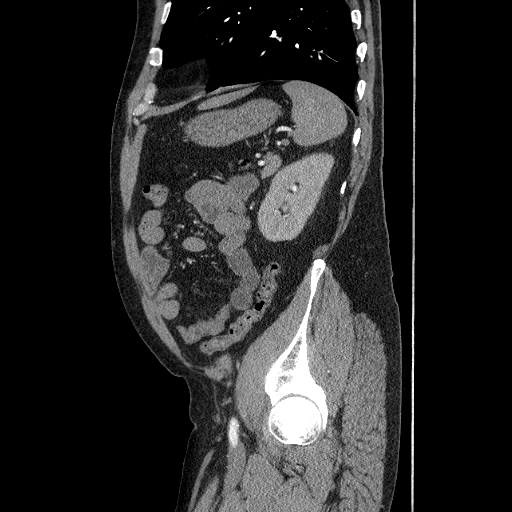
[im 129/161  soft-tissue]
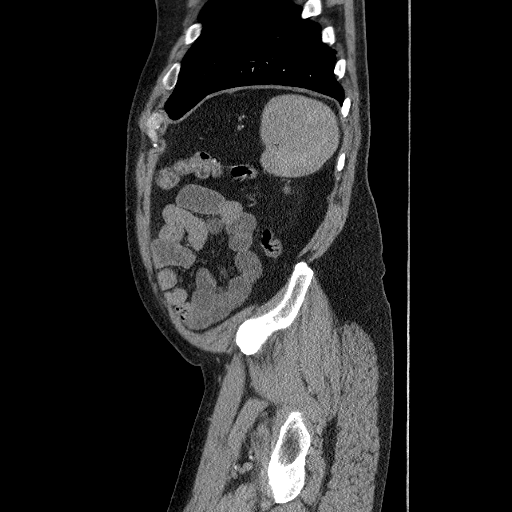
[im 139/161  soft-tissue]
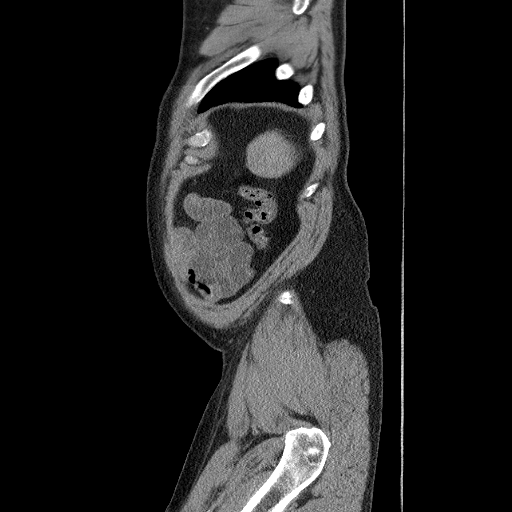
[im 150/161  soft-tissue]
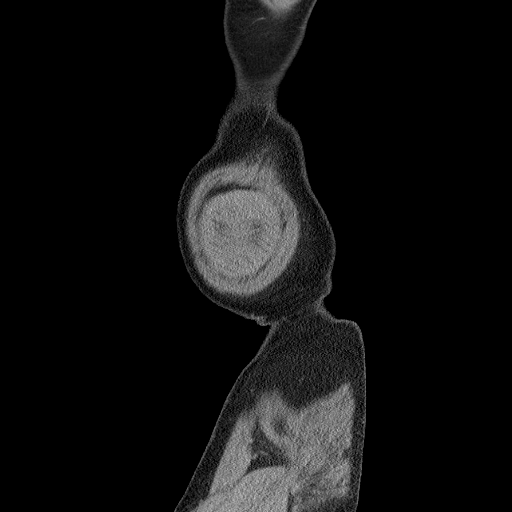

[13 of 46 positions shown; findings below may reference images not displayed]

FINDINGS: Postoperatively, aortic endograft positioning and
patency appears appropriate with no evidence of complication.
There is no evidence of endoleak by CT angiography.  Maximal aortic
sac AP diameter is 5.0 cm compared to 5.1 cm previously.  Maximal
transverse diameter is 4.8 cm.

No evidence of retroperitoneal hemorrhage.  The IMA trunk shows
retrograde opacification via SMA collaterals and a demonstrated
marginal artery.  However, it does not supply a visible endoleak at
the level of the aortic sac.  The native superior mesenteric
artery, celiac axis and bilateral renal arteries show stable and
normal patency.

Pelvic arteries below the level of the endograft limbs show normal
patency.  There are no significant nonvascular findings in the
abdomen or pelvis.

 Review of the MIP images confirms the above findings.
IMPRESSION: Appropriate positioning and patency of the aortic endograft with no
evidence of complication following placement.  Maximal aortic sac
AP diameter has decreased from 5.1 cm to 5.0 cm.  There is no
evidence of endoleak by CTA.

## 2012-10-26 MED ORDER — IOHEXOL 350 MG/ML SOLN
100.0000 mL | Freq: Once | INTRAVENOUS | Status: AC | PRN
  Administered 2012-10-26: 100 mL via INTRAVENOUS

## 2012-10-26 NOTE — Progress Notes (Signed)
Subjective:     Patient ID: Aaron [name], male   DOB: [birth year], 57 y.o.   MRN: [id number]  HPI this 57 year old male returns for initial followup now 5 weeks post insertion of an endovascular stent graft for abdominal aortic aneurysm-Gore C3. Patient denies any abdominal pain or back pain. He has had some abdominal bloating but no change in his bowel habits. He has no claudication symptoms and denies chest pain or shortness of breath. He does not have as much urinary frequency as he had experienced preoperatively.  Past Medical History  Diagnosis Date  . Hyperlipidemia   . Gout   . Rash 2011    left chest, biopsy 2012 Dr. Charlton Haws, results pending  . Dyspnea     normal PFT April 2012  . Tobacco abuse   . Pneumonia     hx of and hx of bronchitis  . GERD (gastroesophageal reflux disease)   . H/O hiatal hernia   . Arthritis     lower back  . Asthma     followed by Dr. Samara Snide  . HTN (hypertension)     hx of sees Dr. Alonna Minium  . Abdominal aortic aneurysm     sees Dr. Jacinto Halim for cardiac f/u, [phone]    History  Substance Use Topics  . Smoking status: Former Smoker -- 0.2 packs/day for 52.5 years    Types: Cigarettes    Quit date: 09/02/2012  . Smokeless tobacco: Current User    Types: Snuff  . Alcohol Use: No     Comment: past ETOH use quit 11-26-2009    Family History  Problem Relation Age of Onset  . Heart attack Father 61  . COPD Father   . Heart disease Father   . Asthma Daughter     No Known Allergies  Current outpatient prescriptions:allopurinol (ZYLOPRIM) 100 MG tablet, Take 100 mg by mouth daily. , Disp: , Rfl: ;  Fluticasone-Salmeterol (ADVAIR DISKUS) 250-50 MCG/DOSE AEPB, Inhale 1 puff into the lungs 2 (two) times daily. , Disp: , Rfl: ;  lisinopril (PRINIVIL,ZESTRIL) 10 MG tablet, Take 10 mg by mouth daily., Disp: , Rfl: ;  nicotine (NICODERM CQ - DOSED IN MG/24 HOURS) 21 mg/24hr patch, Place 1 patch onto the skin daily., Disp: , Rfl:   pantoprazole (PROTONIX) 40 MG tablet, Take 40 mg by mouth every morning. , Disp: , Rfl: ;  simvastatin (ZOCOR) 40 MG tablet, Take 40 mg by mouth at bedtime., Disp: , Rfl: ;  albuterol (PROVENTIL HFA;VENTOLIN HFA) 108 (90 BASE) MCG/ACT inhaler, Inhale 2 puffs into the lungs every 6 (six) hours as needed. For cough and wheezing., Disp: , Rfl: ;  metoprolol tartrate (LOPRESSOR) 25 MG tablet, Take 25 mg by mouth 2 (two) times daily., Disp: , Rfl:  oxyCODONE-acetaminophen (PERCOCET/ROXICET) 5-325 MG per tablet, Take 1-2 tablets by mouth every 4 (four) hours as needed., Disp: 30 tablet, Rfl: 0 No current facility-administered medications for this visit. Facility-Administered Medications Ordered in Other Visits: [COMPLETED] iohexol (OMNIPAQUE) 350 MG/ML injection 100 mL, 100 mL, Intravenous, Once PRN, Medication Radiologist, MD, 100 mL at 10/26/12 1145  BP 125/88  Pulse 61  Resp 18  Ht 6\' 1"  (1.854 m)  Wt 215 lb (97.523 kg)  BMI 28.37 kg/m2  Body mass index is 28.37 kg/(m^2).          Review of Systems denies chest pain, dyspnea on exertion, PND, orthopnea, hemoptysis, claudication    Objective:   Physical Exam blood pressure 125/88 heart rate 61 respirations 18  General well-developed well-nourished male in no apparent stress alert and oriented x3 Lungs no rhonchi or wheezing Abdomen soft nontender no pulsatile mass noted Lower extremities with 3+ femoral and dorsalis pedis pulses palpable bilaterally  Today I ordered a CT angiogram which are reviewed by computer. There is no evidence of endoleak aneurysm sac is 5 cm in diameter with no migration.    Assessment:     Doing well 5 weeks post insertion endovascular stent graft for AAA-no evidence endoleak    Plan:     Return in 6 months for repeat CT angiogram to continue to follow aortic stent graft Patient has stopped smoking and it was emphasized that he needs to continue to totally refrain from cigarette smoking

## 2012-10-26 NOTE — Addendum Note (Signed)
Addended by: Dannielle Karvonen on: 10/26/2012 04:18 PM   Modules accepted: Orders

## 2012-11-05 ENCOUNTER — Ambulatory Visit (INDEPENDENT_AMBULATORY_CARE_PROVIDER_SITE_OTHER): Payer: 59 | Admitting: Urology

## 2012-11-05 DIAGNOSIS — N529 Male erectile dysfunction, unspecified: Secondary | ICD-10-CM

## 2012-12-21 ENCOUNTER — Other Ambulatory Visit: Payer: Self-pay | Admitting: Nurse Practitioner

## 2012-12-21 ENCOUNTER — Ambulatory Visit
Admission: RE | Admit: 2012-12-21 | Discharge: 2012-12-21 | Disposition: A | Discharge status: Home / Self Care | Payer: 59 | Source: Ambulatory Visit | Attending: Nurse Practitioner | Admitting: Nurse Practitioner

## 2012-12-21 DIAGNOSIS — R05 Cough: Secondary | ICD-10-CM

## 2012-12-21 DIAGNOSIS — R059 Cough, unspecified: Secondary | ICD-10-CM

## 2012-12-21 IMAGING — CR DG CHEST 2V
2 series · 2 of 2 positions shown · non-contrast
Comparison: [DATE].

CLINICAL DATA: Cough.  History of asthma.

CHEST - 2 VIEW

[w chest pa]
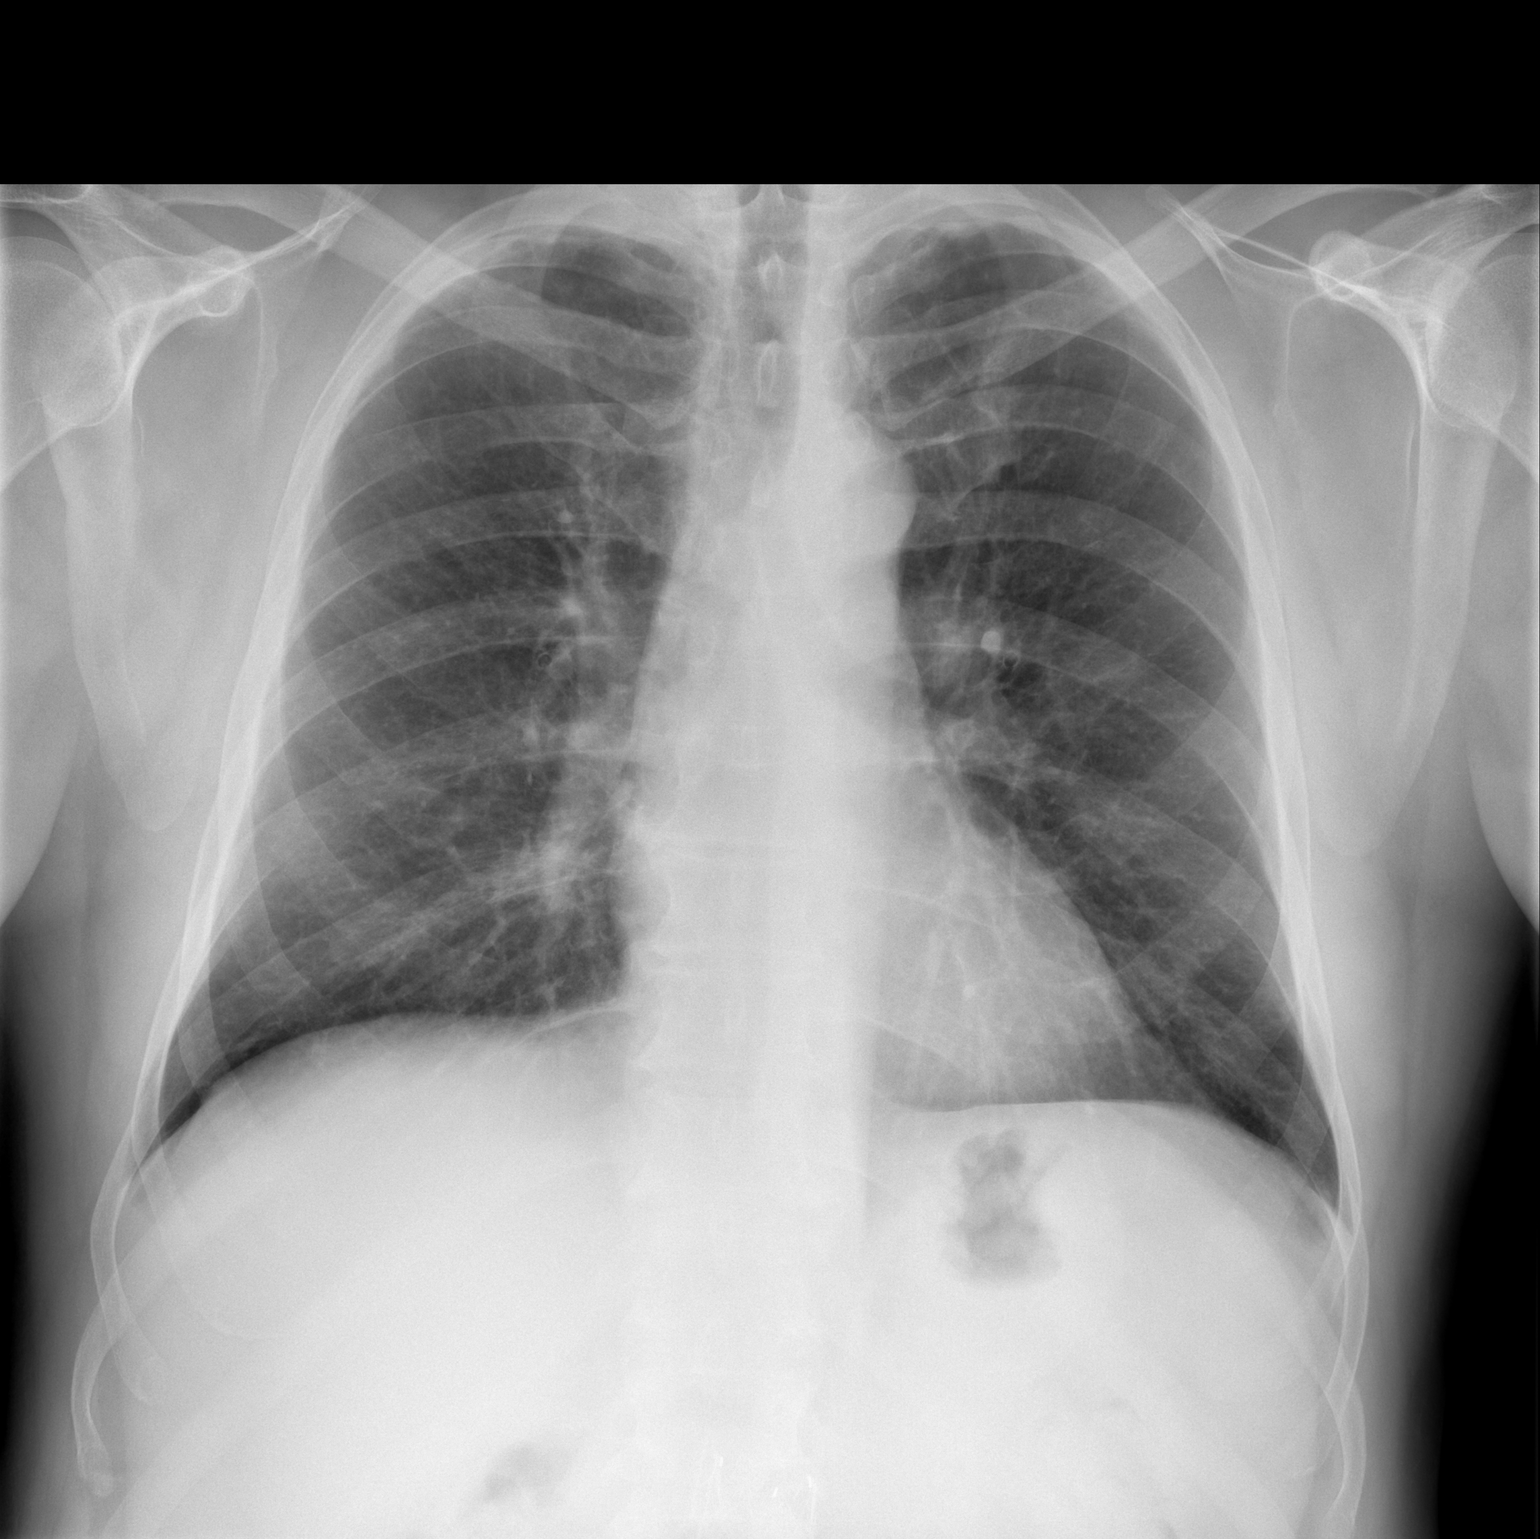

[w chest lat]
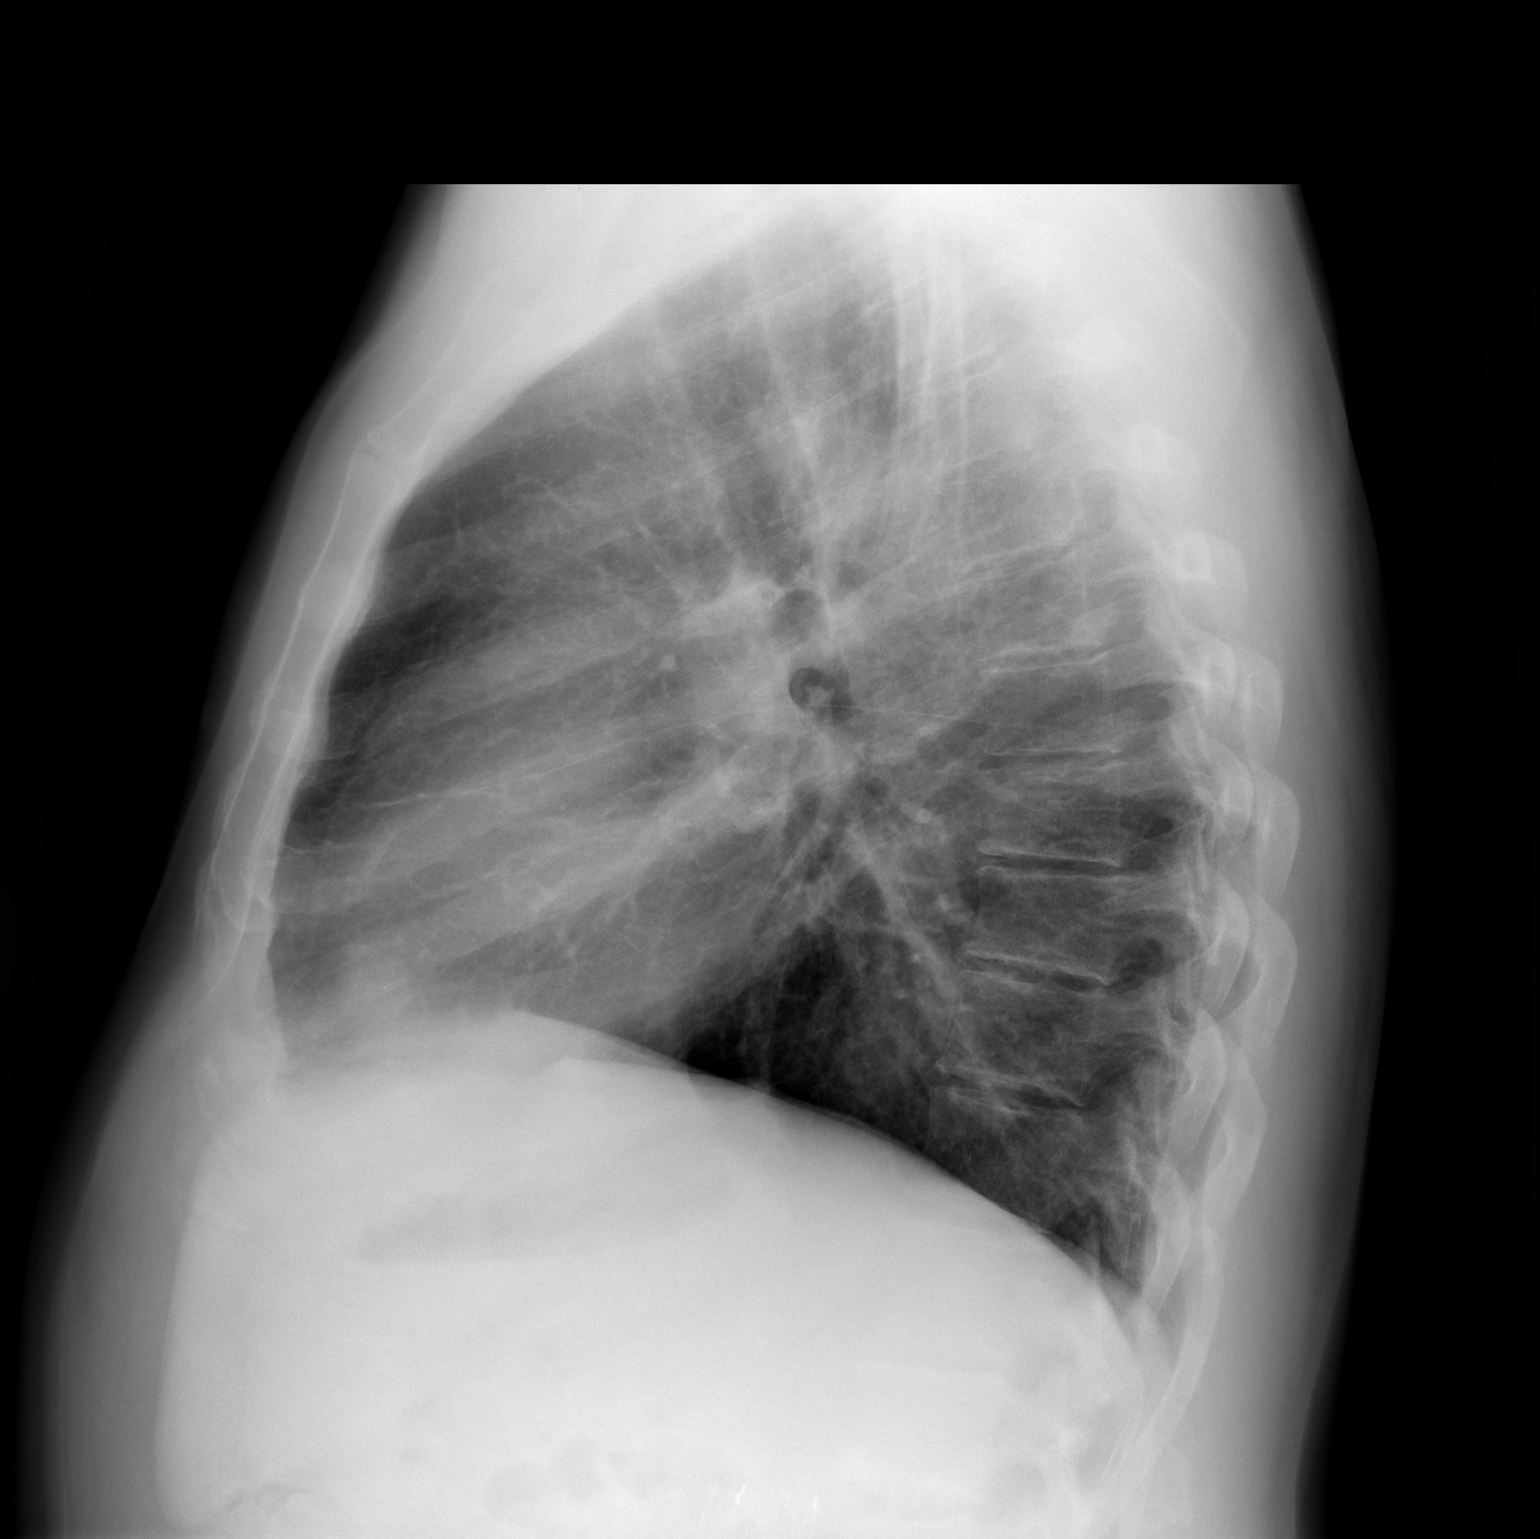

[2 of 2 positions shown; findings below may reference images not displayed]

FINDINGS: The cardiac silhouette, mediastinal and hilar contours
are within normal limits.  There are mild chronic bronchitic type
lung changes, likely related to smoking.  Biapical scarring changes
are also noted.  No acute overlying pulmonary process.  No pleural
effusion.  The bony thorax is intact.
IMPRESSION: Mild chronic bronchitic type lung changes but no acute pulmonary
findings.

## 2013-02-16 ENCOUNTER — Other Ambulatory Visit: Payer: Self-pay | Admitting: *Deleted

## 2013-02-16 ENCOUNTER — Other Ambulatory Visit: Payer: 59

## 2013-02-16 DIAGNOSIS — E785 Hyperlipidemia, unspecified: Secondary | ICD-10-CM

## 2013-02-16 DIAGNOSIS — E298 Other testicular dysfunction: Secondary | ICD-10-CM

## 2013-02-16 DIAGNOSIS — Z0000 Encounter for general adult medical examination without abnormal findings: Secondary | ICD-10-CM

## 2013-02-16 DIAGNOSIS — R7989 Other specified abnormal findings of blood chemistry: Secondary | ICD-10-CM

## 2013-02-16 DIAGNOSIS — M109 Gout, unspecified: Secondary | ICD-10-CM

## 2013-02-16 DIAGNOSIS — I10 Essential (primary) hypertension: Secondary | ICD-10-CM

## 2013-02-17 LAB — CBC WITH DIFFERENTIAL/PLATELET
Basophils Absolute: 0 10*3/uL (ref 0.0–0.2)
Eosinophils Absolute: 0.1 10*3/uL (ref 0.0–0.4)
HCT: 43.5 % (ref 37.5–51.0)
Hemoglobin: 15.1 g/dL (ref 12.6–17.7)
Lymphocytes Absolute: 1.6 10*3/uL (ref 0.7–3.1)
Lymphs: 27 % (ref 14–46)
MCH: 30.1 pg (ref 26.6–33.0)
MCHC: 34.7 g/dL (ref 31.5–35.7)
Neutrophils Absolute: 3.6 10*3/uL (ref 1.4–7.0)

## 2013-02-17 LAB — LIPID PANEL
Cholesterol, Total: 145 mg/dL (ref 100–199)
LDL Calculated: 90 mg/dL (ref 0–99)
Triglycerides: 102 mg/dL (ref 0–149)

## 2013-02-17 LAB — COMPREHENSIVE METABOLIC PANEL
Albumin: 4.7 g/dL (ref 3.5–5.5)
Alkaline Phosphatase: 90 IU/L (ref 39–117)
BUN/Creatinine Ratio: 12 (ref 9–20)
BUN: 12 mg/dL (ref 6–24)
CO2: 26 mmol/L (ref 19–28)
Calcium: 9.9 mg/dL (ref 8.7–10.2)
Chloride: 105 mmol/L (ref 97–108)
Glucose: 109 mg/dL — ABNORMAL HIGH (ref 65–99)
Total Protein: 7.5 g/dL (ref 6.0–8.5)

## 2013-02-23 ENCOUNTER — Encounter: Payer: Self-pay | Admitting: Internal Medicine

## 2013-02-23 ENCOUNTER — Ambulatory Visit (INDEPENDENT_AMBULATORY_CARE_PROVIDER_SITE_OTHER): Payer: 59 | Admitting: Internal Medicine

## 2013-02-23 VITALS — BP 136/90 | HR 87 | Temp 98.0°F | Resp 20 | Wt 231.0 lb

## 2013-02-23 DIAGNOSIS — R7303 Prediabetes: Secondary | ICD-10-CM

## 2013-02-23 DIAGNOSIS — R7309 Other abnormal glucose: Secondary | ICD-10-CM

## 2013-02-23 DIAGNOSIS — J45909 Unspecified asthma, uncomplicated: Secondary | ICD-10-CM

## 2013-02-23 DIAGNOSIS — I719 Aortic aneurysm of unspecified site, without rupture: Secondary | ICD-10-CM

## 2013-02-23 DIAGNOSIS — I10 Essential (primary) hypertension: Secondary | ICD-10-CM

## 2013-02-23 DIAGNOSIS — E785 Hyperlipidemia, unspecified: Secondary | ICD-10-CM

## 2013-02-23 DIAGNOSIS — M109 Gout, unspecified: Secondary | ICD-10-CM

## 2013-02-23 DIAGNOSIS — J4540 Moderate persistent asthma, uncomplicated: Secondary | ICD-10-CM

## 2013-02-23 NOTE — Assessment & Plan Note (Addendum)
S/p repair with stent placed and is on ASA 81 mg daily and statin 40 mg daily. No complications. stable

## 2013-02-23 NOTE — Assessment & Plan Note (Signed)
No recent gout attack. Reviewed uric acid level. Continue allopurinol for now

## 2013-02-23 NOTE — Assessment & Plan Note (Signed)
Continue statin for now

## 2013-02-23 NOTE — Progress Notes (Signed)
  Subjective:    Patient ID: Caison Hearn, male    DOB: [birth year], 57 y.o.   MRN: [id number]  Chief Complaint  Patient presents with  . Medical Managment of Chronic Issues   HPI  57 y/o male patient here for routine follow up. He has been compliant with his medications. meds reviewed. Reviewed labs with pt. Denies any pain or problem with breathing. See ros  Review of Systems  Constitutional:       Has not been exercising and has gained weight  HENT: Negative for congestion and mouth sores.   Eyes: Negative.   Respiratory: Negative for chest tightness, shortness of breath and wheezing.   Cardiovascular: Negative for chest pain, palpitations and leg swelling.  Gastrointestinal: Negative for abdominal pain, constipation and abdominal distention.  Endocrine: Negative.   Genitourinary: Negative for dysuria.  Musculoskeletal: Negative for back pain and arthralgias.  Neurological: Negative for dizziness and weakness.  Hematological: Negative.  Negative for adenopathy.  Psychiatric/Behavioral: Negative for behavioral problems and sleep disturbance.       Objective:   Physical Exam  Constitutional: He is oriented to person, place, and time. He appears well-developed and well-nourished. No distress.  HENT:  Head: Normocephalic and atraumatic.  Mouth/Throat: Oropharynx is clear and moist.  Eyes: Conjunctivae are normal. Pupils are equal, round, and reactive to light.  Neck: Normal range of motion. Neck supple. No thyromegaly present.  Cardiovascular: Normal rate and regular rhythm.   Pulmonary/Chest: Effort normal and breath sounds normal.  Abdominal: Soft. Bowel sounds are normal.  Musculoskeletal: Normal range of motion.  Lymphadenopathy:    He has no cervical adenopathy.  Neurological: He is alert and oriented to person, place, and time.  Skin: Skin is warm and dry. He is not diaphoretic.  Psychiatric: He has a normal mood and affect.    BP 136/90  Pulse 87   Temp(Src) 98 F (36.7 C) (Oral)  Resp 20  Wt 231 lb (104.781 kg)  BMI 30.48 kg/m2  SpO2 98%  LABS- reviewed cbc, flp, cmp, uric acid  Component     Latest Ref Rng 02/16/2013  Hemoglobin A1C     4.8 - 5.6 % 5.7 (H)   Assessment & Plan:  HYPERTENSION bp elevated in office today. No headache, chest pain or SOB. Continue lisinopril 10 mg daily for now and check bp at home. If bp persistently elevated will need to adjust bp meds  Aneurysm of aorta S/p repair with stent placed and is on ASA 81 mg daily and statin 40 mg daily. No complications. stable  Asthma, moderate persistent Normal PFTs. Follows with pulmonary. On advair and prn albuterol  HYPERLIPIDEMIA Continue statin for now  Gouty arthropathy No recent gout attack. Reviewed uric acid level. Continue allopurinol for now  Prediabetes Reviewed a1c. Diet and exercise encouraged for now and will monitor a1c

## 2013-02-23 NOTE — Patient Instructions (Signed)
Blood pressure was slightly elevated in office today. Check blood pressure at home and if reading persistently higher than 140/90 please notify the office and we will need to adjust your blood pressure medication

## 2013-02-23 NOTE — Assessment & Plan Note (Signed)
Reviewed a1c. Diet and exercise encouraged for now and will monitor a1c

## 2013-02-23 NOTE — Assessment & Plan Note (Addendum)
bp elevated in office today. No headache, chest pain or SOB. Continue lisinopril 10 mg daily for now and check bp at home. If bp persistently elevated will need to adjust bp meds

## 2013-02-23 NOTE — Assessment & Plan Note (Signed)
Normal PFTs. Follows with pulmonary. On advair and prn albuterol

## 2013-03-21 ENCOUNTER — Encounter: Payer: Self-pay | Admitting: Internal Medicine

## 2013-03-21 ENCOUNTER — Ambulatory Visit (INDEPENDENT_AMBULATORY_CARE_PROVIDER_SITE_OTHER): Payer: Managed Care, Other (non HMO) | Admitting: Internal Medicine

## 2013-03-21 VITALS — BP 146/92 | HR 68 | Temp 98.0°F | Ht 72.0 in | Wt 233.8 lb

## 2013-03-21 DIAGNOSIS — R059 Cough, unspecified: Secondary | ICD-10-CM

## 2013-03-21 DIAGNOSIS — R053 Chronic cough: Secondary | ICD-10-CM | POA: Insufficient documentation

## 2013-03-21 DIAGNOSIS — R05 Cough: Secondary | ICD-10-CM

## 2013-03-21 MED ORDER — LOSARTAN POTASSIUM 25 MG PO TABS: 25.0000 mg | ORAL_TABLET | Freq: Every day | ORAL | Status: DC

## 2013-03-21 NOTE — Progress Notes (Signed)
Subjective:    Patient ID: Aaron [name], male    DOB: 07-01-1956, 57 y.o.   MRN: [id number]  HPI #Smoker  - 52 pack. Quit October 2013    #Ex-etoh  - quit 2011  #ASthma/Dyspnea  - normal cardiac stress test 2006 and in October 2013  - CXR 2011 - hyperinflation  - PFTs April 2012 (off advair ) - normal.  -  methacholine challenge test 03/05/2011: POSITIVE FOR ASTHMA (PC 20 <4).  #Maintenance of health  - pneumovax 2012       OV 03/21/2013  Routine office visit for the above. He presents with his wife Aaron [name] who is here with him for the first time.  - He underwent abdominal Arctic aneurysm repair in October 2013. Surgery was uneventful. He has been doing well. It appears that postoperatively he was placed on lisinopril for hypertension control. Sometime shortly after that but especially so in the last 1-2 months he and his wife reporting insidious onset of cough that is associated with sputum and some wheezing. that is present all day. Rated as moderate in severity. Associated volume of sputum is small and clear. There is no associated hemoptysis no weight loss or wheezing. RSI cough score is 28 and suggest irritable larynx cough. Am happy to note that he quit smoking October 2013. He is also compliant with his Advair which I do note is a dry powdered inhaler   . Dr Gretta Cool Reflux Symptom Index (> 13-15 suggestive of LPR cough) 0 -> 5  =  none ->severe problem 03/21/2013 On ace inhibitor and advair disck  Hoarseness of problem with voice 1  Clearing  Of Throat 5  Excess throat mucus or feeling of post nasal drip 5  Difficulty swallowing food, liquid or tablets 2  Cough after eating or lying down 4  Breathing difficulties or choking episodes 3  Troublesome or annoying cough 1  Sensation of something sticking in throat or lump in throat 3  Heartburn, chest pain, indigestion, or stomach acid coming up 4  TOTAL 28   Past, Family, Social reviewed: no change since last  visit other than fact noted in history of present illness   Review of Systems  Constitutional: Negative for fever and unexpected weight change.  HENT: Negative for ear pain, nosebleeds, congestion, sore throat, rhinorrhea, sneezing, trouble swallowing, dental problem, postnasal drip and sinus pressure.   Eyes: Negative for redness and itching.  Respiratory: Positive for wheezing. Negative for cough, chest tightness and shortness of breath.   Cardiovascular: Negative for palpitations and leg swelling.  Gastrointestinal: Negative for nausea and vomiting.  Genitourinary: Negative for dysuria.  Musculoskeletal: Negative for joint swelling.  Skin: Negative for rash.  Neurological: Negative for headaches.  Hematological: Does not bruise/bleed easily.  Psychiatric/Behavioral: Negative for dysphoric mood. The patient is not nervous/anxious.    Current outpatient prescriptions:albuterol (PROVENTIL HFA;VENTOLIN HFA) 108 (90 BASE) MCG/ACT inhaler, Inhale 2 puffs into the lungs every 6 (six) hours as needed. For cough and wheezing., Disp: , Rfl: ;  allopurinol (ZYLOPRIM) 100 MG tablet, Take 100 mg by mouth daily. , Disp: , Rfl: ;  Fluticasone-Salmeterol (ADVAIR DISKUS) 250-50 MCG/DOSE AEPB, Inhale 1 puff into the lungs 2 (two) times daily. , Disp: , Rfl:  lisinopril (PRINIVIL,ZESTRIL) 10 MG tablet, Take 10 mg by mouth daily., Disp: , Rfl: ;  pantoprazole (PROTONIX) 40 MG tablet, Take 40 mg by mouth every morning. , Disp: , Rfl: ;  simvastatin (ZOCOR) 40 MG tablet, Take 40 mg by  mouth at bedtime., Disp: , Rfl:       Objective:   Physical Exam Nursing note and vitals reviewed. Constitutional: He is oriented to person, place, and time. He appears well-developed and well-nourished. No distress.  HENT:  Head: Normocephalic and atraumatic.  Right Ear: External ear normal.  Left Ear: External ear normal.  Mouth/Throat: Oropharynx is clear and moist. No oropharyngeal exudate.  Eyes: Conjunctivae and EOM  are normal. Pupils are equal, round, and reactive to light. Right eye exhibits no discharge. Left eye exhibits no discharge. No scleral icterus.  Neck: Normal range of motion. Neck supple. No JVD present. No tracheal deviation present. No thyromegaly present.  Cardiovascular: Normal rate, regular rhythm and intact distal pulses.  Exam reveals no gallop and no friction rub.   No murmur heard. Pulmonary/Chest: Effort normal and breath sounds normal. No respiratory distress. He has no wheezes. He has no rales. He exhibits no tenderness.  Abdominal: Soft. Bowel sounds are normal. He exhibits no distension and no mass. There is no tenderness. There is no rebound and no guarding.  Musculoskeletal: Normal range of motion. He exhibits no edema and no tenderness.  Lymphadenopathy:    He has no cervical adenopathy.  Neurological: He is alert and oriented to person, place, and time. He has normal reflexes. No cranial nerve deficit. Coordination normal.  Skin: Skin is warm and dry. No rash noted. He is not diaphoretic. No erythema. No pallor.  Psychiatric: He has a normal mood and affect. His behavior is normal. Judgment and thought content normal.              Assessment & Plan:

## 2013-03-21 NOTE — Patient Instructions (Addendum)
#  cough  - please stop lisinopril 10mg ; suspect this is contributing to cough - instead start losartan 25mg  daily - continue advair 250/50, 1 puff twice daily - return in 1 month   - cough score at followup  - spirometry at followup  #BP  - monitor BP as before per advice of your cardiologist/primary care but with the changes  - any BP issues call them  #Followup  1 month with spirometry and Cough score at followup

## 2013-03-21 NOTE — Assessment & Plan Note (Addendum)
#  cough  - please stop lisinopril 10mg ; suspect this is contributing to cough - instead start losartan 25mg  daily - continue advair 250/50, 1 puff twice daily - return in 1 month   - cough score at followup  - spirometry at followup  #BP  - monitor BP as before per advice of your cardiologist/primary care but with the changes  - any BP issues call them  #Followup  1 month with spirometry and Cough score at followup   > 50% of this > 25 min visit spent in face to face counseling (15 min visit converted to 25 min)

## 2013-03-24 ENCOUNTER — Telehealth: Payer: Self-pay | Admitting: Geriatric Medicine

## 2013-03-24 NOTE — Telephone Encounter (Signed)
Patient was prescribed Losartan Potassium 25mg  once daily by his pulmonologist. The patient called and said that he is having headaches and his blood pressure is running 145/90. He wants to know if he can take two pills per day instead of one. Please advise, thanks.

## 2013-03-25 ENCOUNTER — Telehealth: Payer: Self-pay | Admitting: Geriatric Medicine

## 2013-03-25 ENCOUNTER — Telehealth: Payer: Self-pay | Admitting: Internal Medicine

## 2013-03-25 NOTE — Telephone Encounter (Signed)
Informed patient that is ok to increase Losartan to twice daily.

## 2013-03-25 NOTE — Telephone Encounter (Signed)
Per Charlynn Grimes she has taken care of this

## 2013-03-25 NOTE — Telephone Encounter (Signed)
Patient Instructions    #cough  - please stop lisinopril 10mg ; suspect this is contributing to cough  - instead start losartan 25mg  daily  - continue advair 250/50, 1 puff twice daily  - return in 1 month  - cough score at followup  - spirometry at followup  #BP  - monitor BP as before per advice of your cardiologist/primary care but with the changes  - any BP issues call them  #Followup  1 month with spirometry and Cough score at followup   Above are the patient instructions from last ov LMTCB for pt

## 2013-03-25 NOTE — Telephone Encounter (Signed)
Yes please increase losartan to 50 mg daily and have him monitor bp at home.

## 2013-03-28 NOTE — Telephone Encounter (Signed)
I spoke with the pt and advised that he needs to contact his cardiologists or PCP for BP issues per pt instructions. Pt states he did and they increased his meds to losartan 50mg  daily. Nothing further needed. Carron Curie, CMA

## 2013-03-29 ENCOUNTER — Telehealth: Payer: Self-pay | Admitting: *Deleted

## 2013-03-29 NOTE — Telephone Encounter (Signed)
Patient called and stated that his Pulmonologist changed his BP medication to Losartan 50mg . Patient says his BP has been running alittle on the higher side the highest being 140/90. I offered him an appointment, but he said he would keep a log and call his Pulmonologist and see what he would say. Told patient to call me back if he needed an appointment, would be more than glad to schedule him. He agreed.

## 2013-04-05 ENCOUNTER — Telehealth: Payer: Self-pay | Admitting: Internal Medicine

## 2013-04-05 ENCOUNTER — Other Ambulatory Visit: Payer: Self-pay | Admitting: Geriatric Medicine

## 2013-04-05 MED ORDER — LOSARTAN POTASSIUM 25 MG PO TABS: 50.0000 mg | ORAL_TABLET | Freq: Every day | ORAL | Status: DC

## 2013-04-05 NOTE — Telephone Encounter (Signed)
Spoke with pharmacy rep Requesting new Rx for losartan increase According to phone note from 03/25/13 patient had this medication increased by cardiologist I have informed pharmacy they would have to contact patients cardiologist for new Rx Nothing further needed at this time

## 2013-04-13 ENCOUNTER — Encounter: Payer: Self-pay | Admitting: Cardiology

## 2013-04-20 ENCOUNTER — Encounter: Payer: Self-pay | Admitting: Internal Medicine

## 2013-04-20 ENCOUNTER — Ambulatory Visit (INDEPENDENT_AMBULATORY_CARE_PROVIDER_SITE_OTHER): Payer: Managed Care, Other (non HMO) | Admitting: Internal Medicine

## 2013-04-20 VITALS — BP 112/78 | HR 81 | Ht 74.0 in | Wt 237.0 lb

## 2013-04-20 DIAGNOSIS — R059 Cough, unspecified: Secondary | ICD-10-CM

## 2013-04-20 DIAGNOSIS — R053 Chronic cough: Secondary | ICD-10-CM

## 2013-04-20 DIAGNOSIS — R05 Cough: Secondary | ICD-10-CM

## 2013-04-20 DIAGNOSIS — F17200 Nicotine dependence, unspecified, uncomplicated: Secondary | ICD-10-CM

## 2013-04-20 MED ORDER — MOMETASONE FURO-FORMOTEROL FUM 100-5 MCG/ACT IN AERO: 2.0000 | INHALATION_SPRAY | Freq: Two times a day (BID) | RESPIRATORY_TRACT | Status: DC

## 2013-04-20 NOTE — Progress Notes (Signed)
Subjective:    Patient ID: Aaron [name], male    DOB: 1956/06/13, 57 y.o.   MRN: [id number]  HPI #Smoker  - 52 pack. Quit October 2013    #Ex-etoh  - quit 2011  #ASthma/Dyspnea  - normal cardiac stress test 2006 and in October 2013  - CXR 2011 - hyperinflation  - PFTs April 2012 (off advair ) - normal.  -  methacholine challenge test 03/05/2011: POSITIVE FOR ASTHMA (PC 20 <4).  #Maintenance of health  - pneumovax 2012       OV 03/21/2013  Routine office visit for the above. He presents with his wife Aaron [name] who is here with him for the first time.  - He underwent abdominal Arctic aneurysm repair in October 2013. Surgery was uneventful. He has been doing well. It appears that postoperatively he was placed on lisinopril for hypertension control. Sometime shortly after that but especially so in the last 1-2 months he and his wife reporting insidious onset of cough that is associated with sputum and some wheezing. that is present all day. Rated as moderate in severity. Associated volume of sputum is small and clear. There is no associated hemoptysis no weight loss or wheezing. RSI cough score is 28 and suggest irritable larynx cough. Am happy to note that he quit smoking October 2013. He is also compliant with his Advair which I do note is a dry powdered inhaler   REC   #cough  - please stop lisinopril 10mg ; suspect this is contributing to cough  - instead start losartan 25mg  daily  - continue advair 250/50, 1 puff twice daily  - return in 1 month  - cough score at followup  - spirometry at followup  #BP  - monitor BP as before per advice of your cardiologist/primary care but with the changes  - any BP issues call them  #Followup  1 month with spirometry and Cough score at followup   OV 04/20/2013 Presents with wife Aaron [name]. . He's here to evaluate   - cough and dyspnea after stopping lisinopril.  - But also checking his blood pressure after changing his lisinopril to  losartan. - Review his smoking: Still in remission since October 2013  Currently he is on  advair: Spirometry shows an FEV1 of 3.3L/79% and reduced ratio. Consistent with mild early obstruction possibly. Still with mucus and cough. 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 His RSI cough score is improved to 14 shows a 50% reduction in cough severe he but subjectively he does not feel any better. In fact is complaining of mucus production. He is frustrated that despite quitting smoking that cough has not resolved and he feels that he might as well go back to smoking again. . Dr Gretta Cool Reflux Symptom Index (> 13-15 suggestive of LPR cough) 0 -> 5  =  none ->severe problem 03/21/2013 On ace inhibitor and advair disck 04/20/2013   Hoarseness of problem with voice 1 3  Clearing  Of Throat 5 3  Excess throat mucus or feeling of post nasal drip 5 3  Difficulty swallowing food, liquid or tablets 2 0  Cough after eating or lying down 4 1  Breathing difficulties or choking episodes 3 4  Troublesome or annoying cough 1 0  Sensation of something sticking in throat or lump in throat 3 0  Heartburn, chest pain, indigestion, or stomach acid coming up 4 0  TOTAL 28 14      Kouffman Reflux v Neurogenic Cough Differentiator Reflux 04/20/2013  Do you awaken from a sound sleep coughing violently?                            With trouble breathing? n  Do you have choking episodes when you cannot  Get enough air, gasping for air ?              n  Do you usually cough when you lie down into  The bed, or when you just lie down to rest ?                          n  Do you usually cough after meals or eating?         n  Do you cough when (or after) you bend over?    n  GERD SCORE  0  Kouffman Reflux v Neurogenic Cough Differentiator Neurogenic  Do you more-or-less cough all day long? n  Does change of temperature make you cough? n  Does laughing or chuckling cause you to cough? nn  Do fumes (perfume,  automobile fumes, burned  Toast, etc.,) cause you to cough ?      n  Does speaking, singing, or talking on the phone cause you to cough   ?               n  Neurogenic/Airway score 0     Past, Family, Social reviewed: His cardiologist Dr.Ganji is placed on antidepressant Lexapro a week ago      Review of Systems  Constitutional: Negative for fever and unexpected weight change.  HENT: Negative for ear pain, nosebleeds, congestion, sore throat, rhinorrhea, sneezing, trouble swallowing, dental problem, postnasal drip and sinus pressure.   Eyes: Negative for redness and itching.  Respiratory: Positive for cough, chest tightness, shortness of breath and wheezing.   Cardiovascular: Negative for palpitations and leg swelling.  Gastrointestinal: Negative for nausea and vomiting.  Genitourinary: Negative for dysuria.  Musculoskeletal: Negative for joint swelling.  Skin: Negative for rash.  Neurological: Negative for headaches.  Hematological: Does not bruise/bleed easily.  Psychiatric/Behavioral: Positive for dysphoric mood. The patient is nervous/anxious.    Current outpatient prescriptions:albuterol (PROVENTIL HFA;VENTOLIN HFA) 108 (90 BASE) MCG/ACT inhaler, Inhale 2 puffs into the lungs every 6 (six) hours as needed. For cough and wheezing., Disp: , Rfl: ;  allopurinol (ZYLOPRIM) 100 MG tablet, Take 100 mg by mouth daily. , Disp: , Rfl: ;  escitalopram (LEXAPRO) 10 MG tablet, Take 10 mg by mouth daily., Disp: , Rfl:  Fluticasone-Salmeterol (ADVAIR DISKUS) 250-50 MCG/DOSE AEPB, Inhale 1 puff into the lungs 2 (two) times daily. , Disp: , Rfl: ;  losartan (COZAAR) 50 MG tablet, Take 50 mg by mouth daily., Disp: , Rfl: ;  pantoprazole (PROTONIX) 40 MG tablet, Take 40 mg by mouth every morning. , Disp: , Rfl: ;  simvastatin (ZOCOR) 40 MG tablet, Take 40 mg by mouth at bedtime., Disp: , Rfl:       Objective:   Physical Exam Nursing note and vitals reviewed. Constitutional: He is oriented to  person, place, and time. He appears well-developed and well-nourished. No distress.  HENT:  Head: Normocephalic and atraumatic.  Right Ear: External ear normal.  Left Ear: External ear normal.  Mouth/Throat: Oropharynx is clear and moist. No oropharyngeal exudate.  Eyes: Conjunctivae and EOM are normal. Pupils are equal, round, and reactive to light. Right eye exhibits no discharge. Left  eye exhibits no discharge. No scleral icterus.  Neck: Normal range of motion. Neck supple. No JVD present. No tracheal deviation present. No thyromegaly present.  Cardiovascular: Normal rate, regular rhythm and intact distal pulses.  Exam reveals no gallop and no friction rub.   No murmur heard. Pulmonary/Chest: Effort normal and breath sounds normal. No respiratory distress. He has no wheezes. He has no rales. He exhibits no tenderness.  Abdominal: Soft. Bowel sounds are normal. He exhibits no distension and no mass. There is no tenderness. There is no rebound and no guarding.  Musculoskeletal: Normal range of motion. He exhibits no edema and no tenderness.  Lymphadenopathy:    He has no cervical adenopathy.  Neurological: He is alert and oriented to person, place, and time. He has normal reflexes. No cranial nerve deficit. Coordination normal.  Skin: Skin is warm and dry. No rash noted. He is not diaphoretic. No erythema. No pallor.  Psychiatric: He has a normal mood and affect. His behavior is normal. Judgment and thought content normal.          Assessment & Plan:

## 2013-04-20 NOTE — Patient Instructions (Addendum)
Chronic cough with asthma - Stop Advair - Start Dulera 100/52 puff 2 times daily; use with a spacer - Use albuterol as needed - Start nasal steroid Nasacort or Nasonex over-the-counter 2 squirts in each nostril once daily - Start 3% hypertonic saline nasal spray made that company called [employer] 2 squirts in each nostril once at night - Avoid colas spices cheeses pizzas and fried food - Continue acid reflux Protonix - Have CT scan of the chest on 04/26/2013 at the time he have CT scan of the abdomen  Followup - 1 month with cough score - Shortness of breath still persists at followup we'll do bike pulmonary stress test

## 2013-04-22 NOTE — Assessment & Plan Note (Signed)
Chronic cough with asthma - Stop Advair - Start Dulera 100/52 puff 2 times daily; use with a spacer - Use albuterol as needed - Start nasal steroid Nasacort or Nasonex over-the-counter 2 squirts in each nostril once daily - Start 3% hypertonic saline nasal spray made that company called [employer] 2 squirts in each nostril once at night - Avoid colas spices cheeses pizzas and fried food - Continue acid reflux Protonix - Have CT scan of the chest on 04/26/2013 at the time he have CT scan of the abdomen  Followup - 1 month with cough score - Shortness of breath still persists at followup we'll do bike pulmonary stress test   > 50% of this > 25 min visit spent in face to face counseling (15 min visit converted to 25 min)

## 2013-04-22 NOTE — Assessment & Plan Note (Signed)
Still in remission as of May 2014 having quit October 2013

## 2013-04-25 ENCOUNTER — Encounter: Payer: Self-pay | Admitting: Vascular Surgery

## 2013-04-26 ENCOUNTER — Ambulatory Visit
Admission: RE | Admit: 2013-04-26 | Discharge: 2013-04-26 | Disposition: A | Discharge status: Home / Self Care | Payer: 59 | Source: Ambulatory Visit | Attending: Internal Medicine | Admitting: Internal Medicine

## 2013-04-26 ENCOUNTER — Ambulatory Visit
Admission: RE | Admit: 2013-04-26 | Discharge: 2013-04-26 | Disposition: A | Discharge status: Home / Self Care | Payer: 59 | Source: Ambulatory Visit | Attending: Vascular Surgery | Admitting: Vascular Surgery

## 2013-04-26 ENCOUNTER — Encounter: Payer: Self-pay | Admitting: Vascular Surgery

## 2013-04-26 ENCOUNTER — Other Ambulatory Visit: Payer: Self-pay | Admitting: *Deleted

## 2013-04-26 ENCOUNTER — Ambulatory Visit (INDEPENDENT_AMBULATORY_CARE_PROVIDER_SITE_OTHER): Payer: Managed Care, Other (non HMO) | Admitting: Vascular Surgery

## 2013-04-26 ENCOUNTER — Other Ambulatory Visit: Payer: Self-pay | Admitting: Internal Medicine

## 2013-04-26 VITALS — BP 137/89 | HR 59 | Resp 18 | Ht 72.0 in | Wt 227.0 lb

## 2013-04-26 DIAGNOSIS — R05 Cough: Secondary | ICD-10-CM

## 2013-04-26 DIAGNOSIS — I719 Aortic aneurysm of unspecified site, without rupture: Secondary | ICD-10-CM

## 2013-04-26 DIAGNOSIS — I714 Abdominal aortic aneurysm, without rupture, unspecified: Secondary | ICD-10-CM

## 2013-04-26 DIAGNOSIS — R059 Cough, unspecified: Secondary | ICD-10-CM

## 2013-04-26 DIAGNOSIS — R053 Chronic cough: Secondary | ICD-10-CM

## 2013-04-26 DIAGNOSIS — Z48812 Encounter for surgical aftercare following surgery on the circulatory system: Secondary | ICD-10-CM

## 2013-04-26 IMAGING — CT CT CTA ABD/PEL W/CM AND/OR W/O CM
2 of 9 series · 13 of 46 positions shown, 19 images · IV contrast (80CC OMNI 350)
Comparison: [DATE].

CLINICAL DATA: Status SAITA to treat abdominal aortic aneurysm
on [DATE].

CT ANGIOGRAPHY ABDOMEN AND PELVIS
TECHNIQUE: Multidetector CT imaging of the abdomen and pelvis was
performed using the standard protocol during bolus administration
of intravenous contrast.  Multiplanar reconstructed images
including MIPs were obtained and reviewed to evaluate the vascular
anatomy.
Contrast: 80mL OMNIPAQUE IOHEXOL 350 MG/ML SOLN

[Series 4: angio · axial · 0.87mm/px · z∈[-425,+13]mm · 11 of 266 slices shown, 16 images]
[im 17/266  soft-tissue]
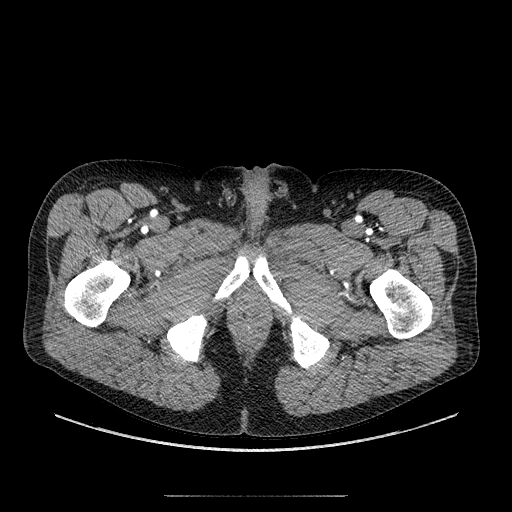
[im 17/266  bone]
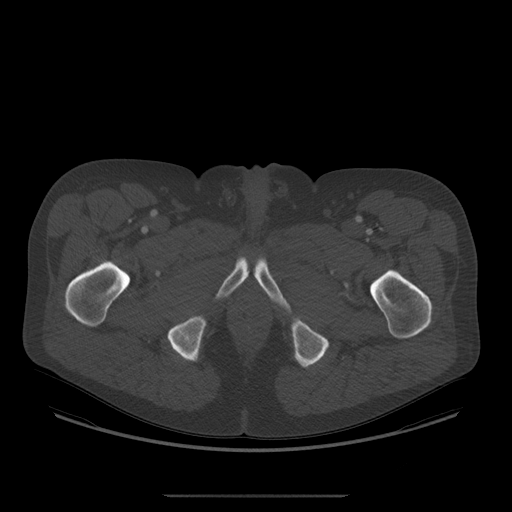
[im 50/266  soft-tissue]
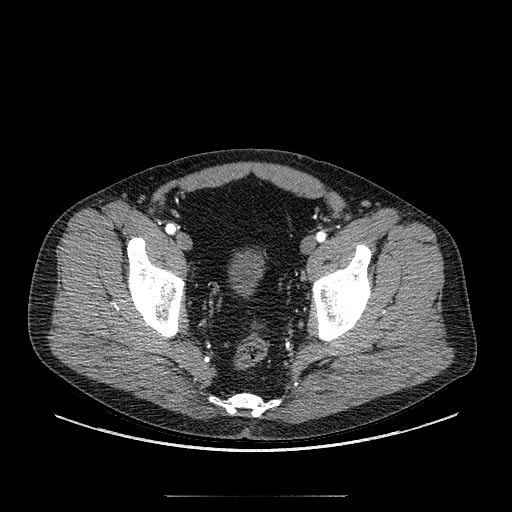
[im 67/266  soft-tissue]
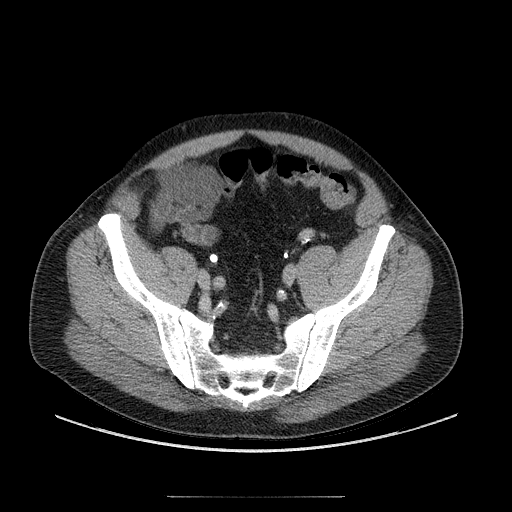
[im 100/266  soft-tissue]
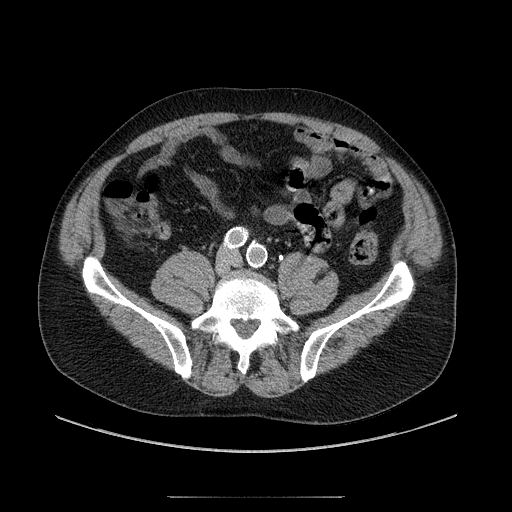
[im 116/266  soft-tissue]
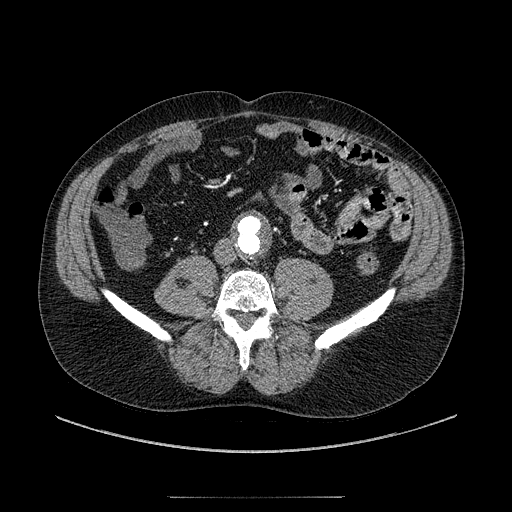
[im 150/266  soft-tissue]
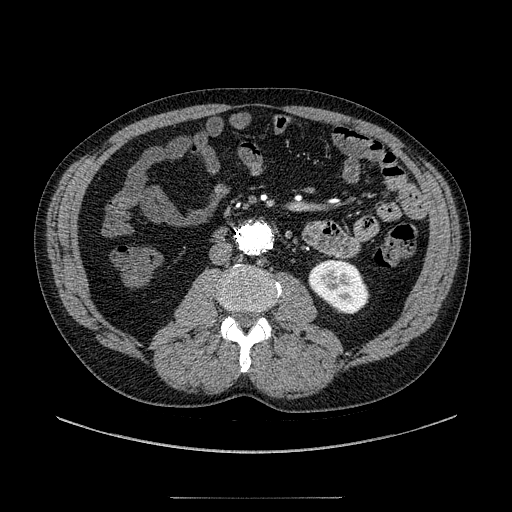
[im 166/266  soft-tissue]
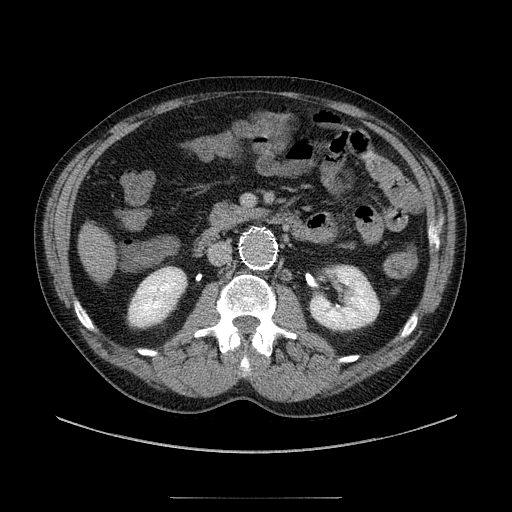
[im 199/266  soft-tissue]
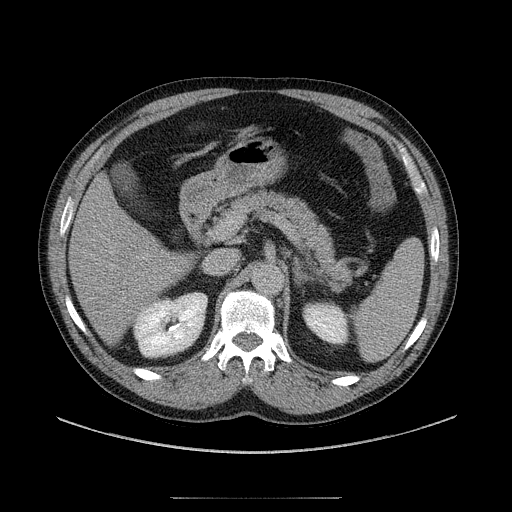
[im 199/266  lung]
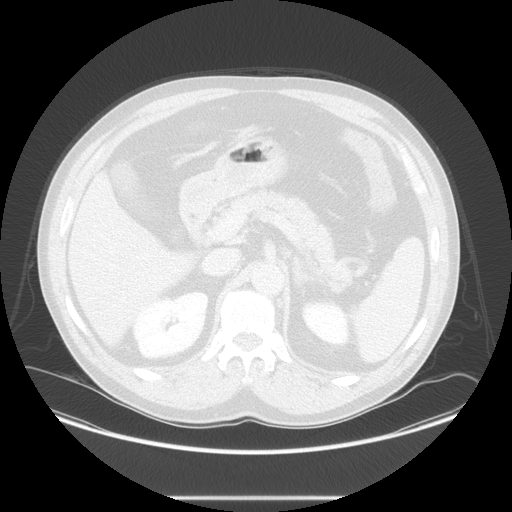
[im 216/266  soft-tissue]
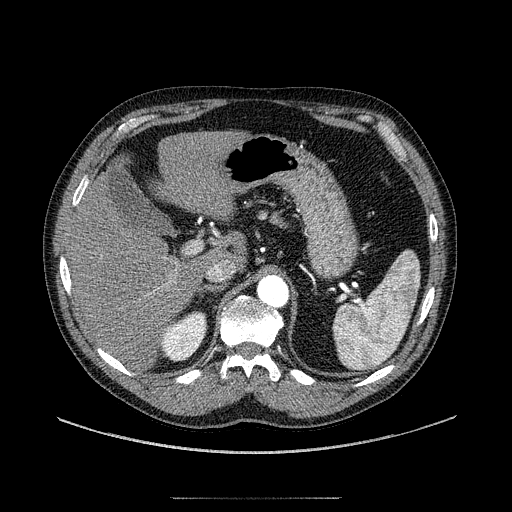
[im 216/266  lung]
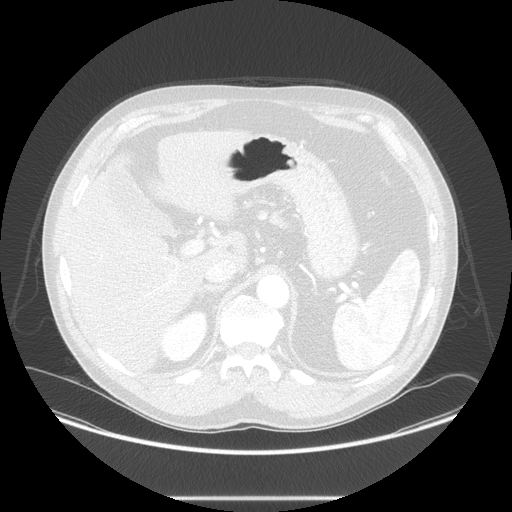
[im 216/266  bone]
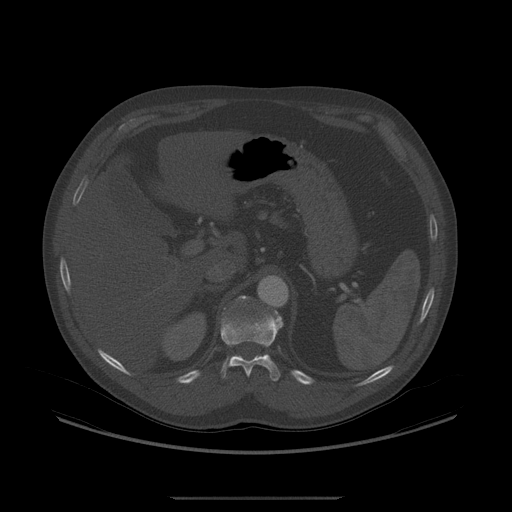
[im 232/266  lung]
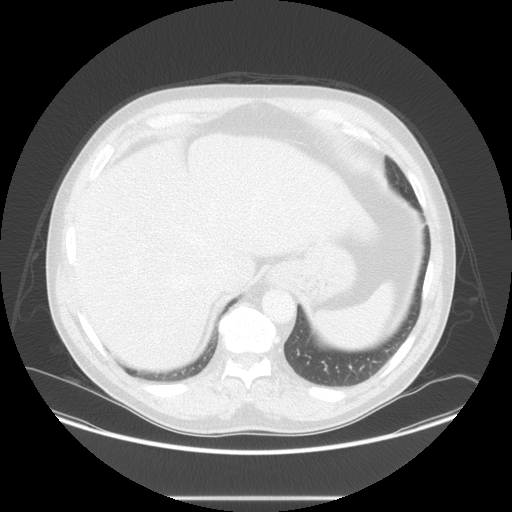
[im 249/266  soft-tissue]
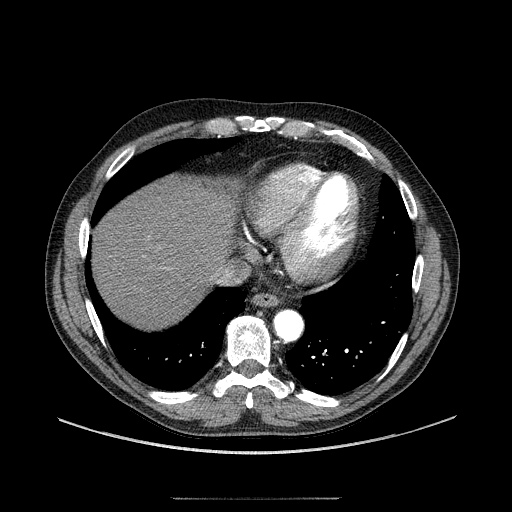
[im 249/266  lung]
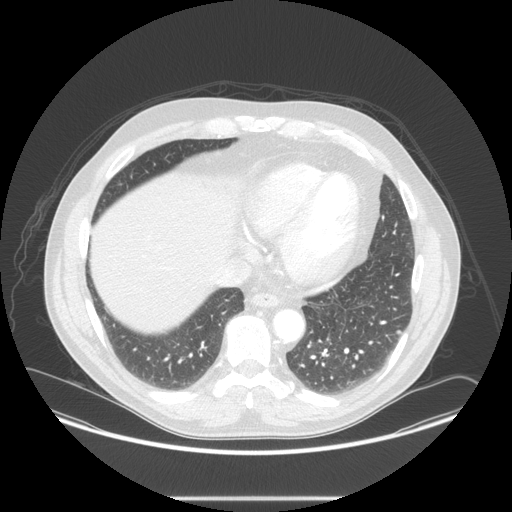

[Series 303: cor · coronal · 1.04mm/px · 2 of 129 slices shown, 3 images]
[im 43/129  soft-tissue]
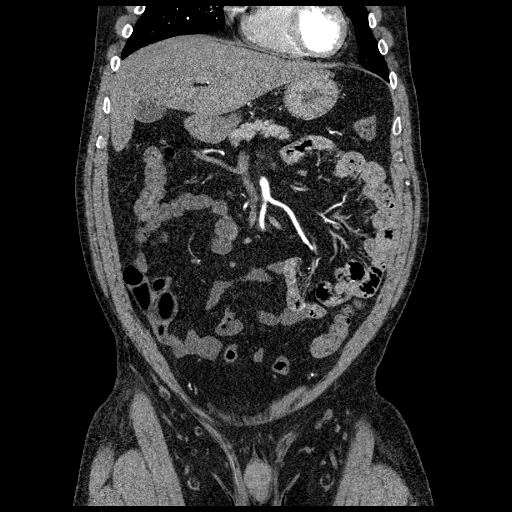
[im 43/129  bone]
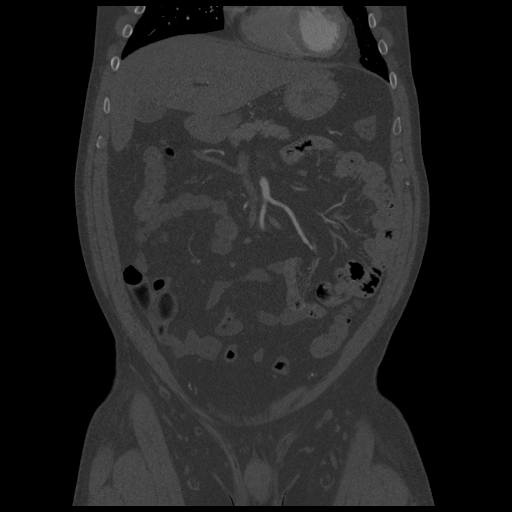
[im 86/129  soft-tissue]
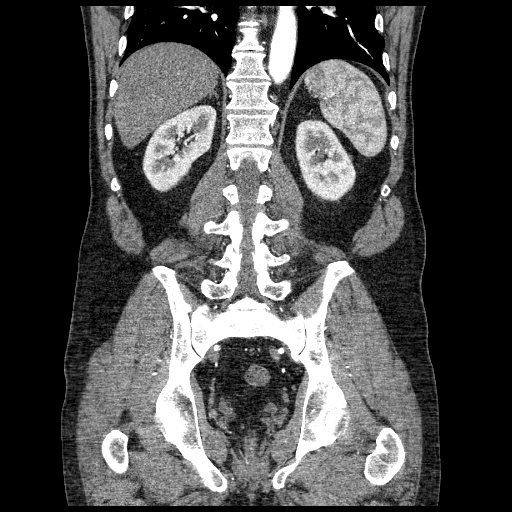

[13 of 46 positions shown; findings below may reference images not displayed]

FINDINGS: The aortic endograft shows stable positioning and normal
patency without evidence of complication.  No endoleak is
identified.  There is further diminishment in size of the
thrombosed aneurysm sac which now measures approximately 3.9 x
cm (previously 4.8 x 5.0 cm).

Native iliac, femoral and visceral arteries show stable and normal
patency.  Stable appearance of early bifurcation of the right renal
artery into two separate branches supplying the right kidney.
Stable retrograde collateral reconstitution of IMA branches without
evidence of IMA supplied endoleak.

No incidental nonvascular findings are identified in the abdomen or
pelvis.  No incidental masses or enlarged lymph nodes.  No hernias
are identified.  No abnormal fluid collections.  Stable mild
degenerative disease of the lower lumbar spine.

 Review of the MIP images confirms the above findings.
IMPRESSION: No complications after SAITA with further diminishment in size of
the thrombosed aneurysm sac which now measures 4.3 cm in greatest
diameter compared to 5.0 cm previously.  No endoleak is identified.

## 2013-04-26 IMAGING — CT CT CHEST W/O CM
3 of 6 series · 14 of 30 positions shown, 15 images · non-contrast
Comparison: Chest x-ray dated [DATE]

CLINICAL DATA: Persistent cough.

CT CHEST WITHOUT CONTRAST
TECHNIQUE: Multidetector CT imaging of the chest was performed
following the standard protocol without IV contrast.

[Series 3: chest w/o · axial · non-contrast · 0.83mm/px · z∈[-194,-44]mm · 3 of 61 slices shown, 4 images]
[im 16/61  mediastinal]
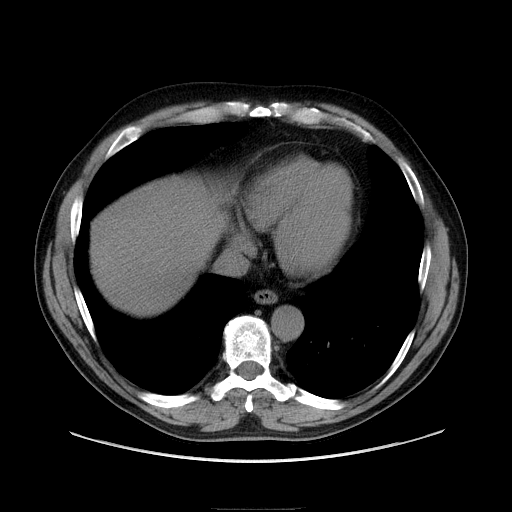
[im 16/61  lung]
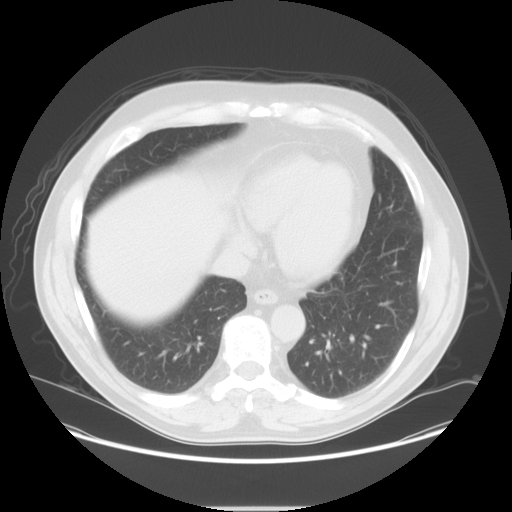
[im 31/61  lung]
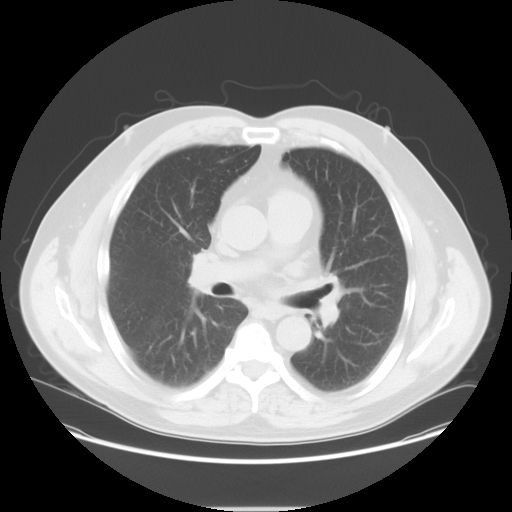
[im 46/61  lung]
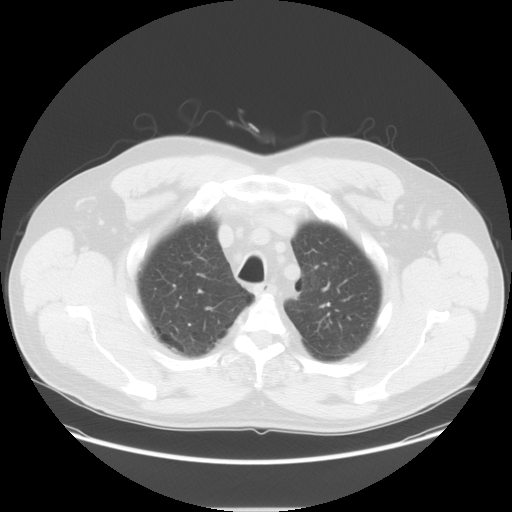

[Series 4: lung windows · axial · 0.83mm/px · z∈[-194,-44]mm · 3 of 61 slices shown]
[im 16/61  lung]
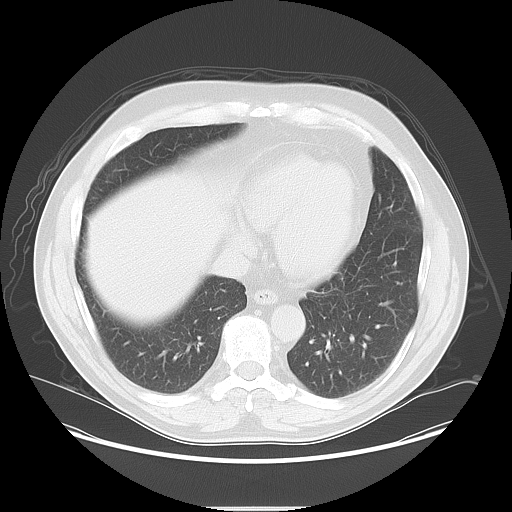
[im 31/61  lung]
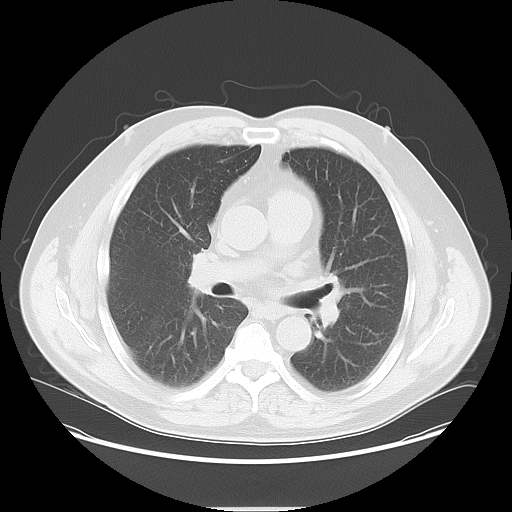
[im 46/61  lung]
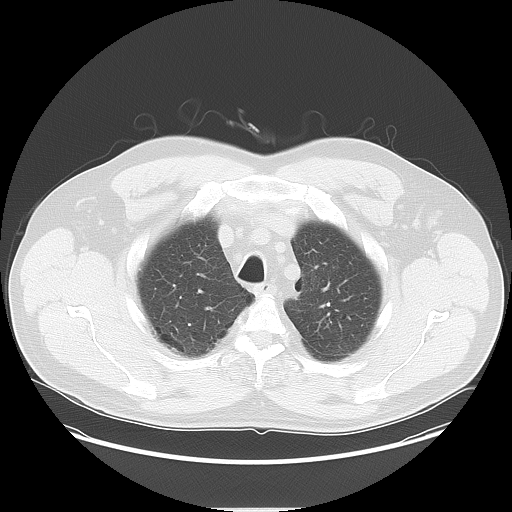

[Series 602: sagittal body · sagittal · 0.83mm/px · 8 of 172 slices shown]
[im 12/172  mediastinal]
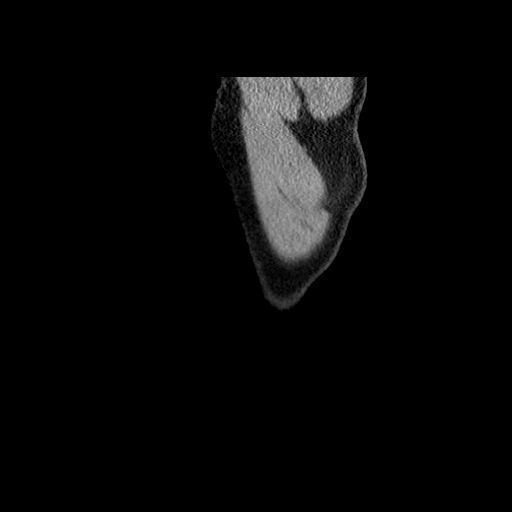
[im 35/172  mediastinal]
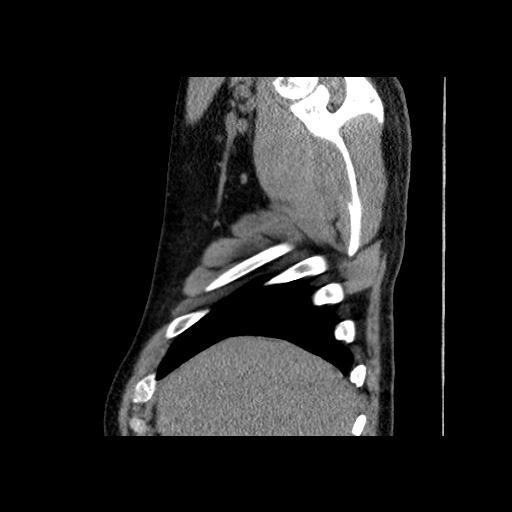
[im 58/172  mediastinal]
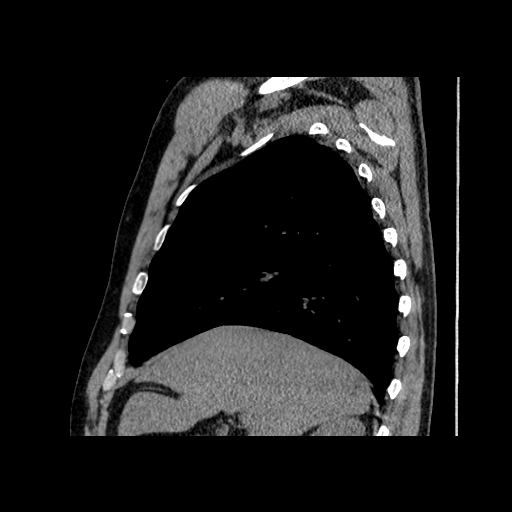
[im 80/172  mediastinal]
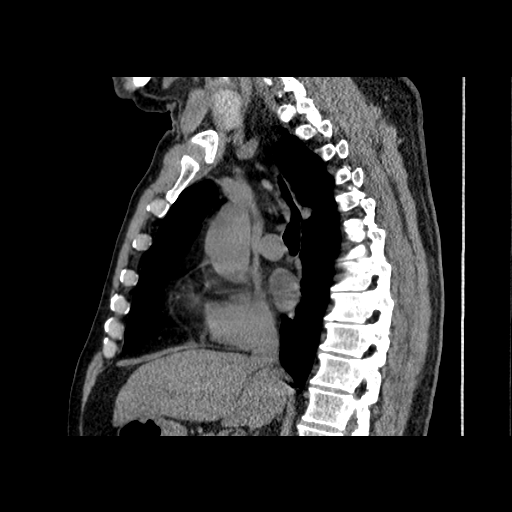
[im 92/172  mediastinal]
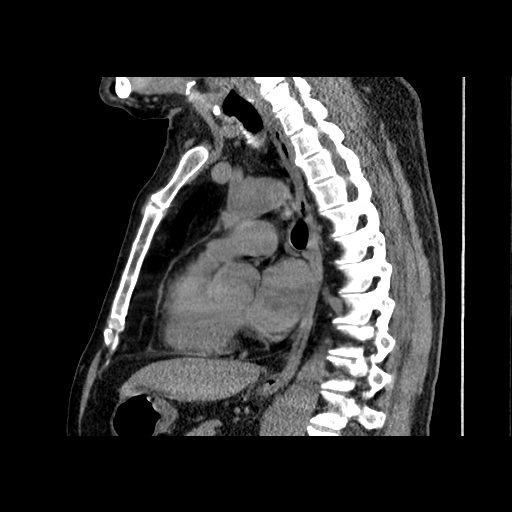
[im 115/172  mediastinal]
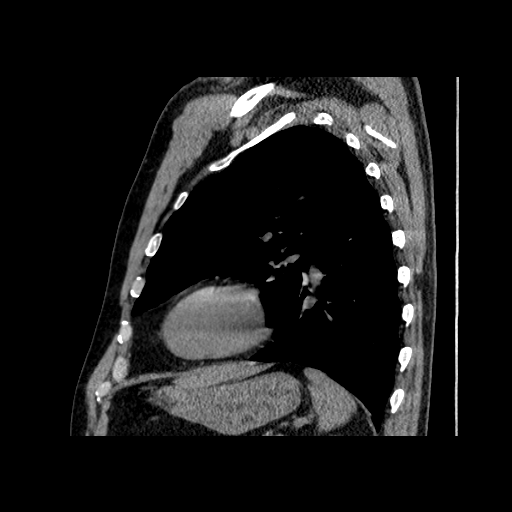
[im 137/172  mediastinal]
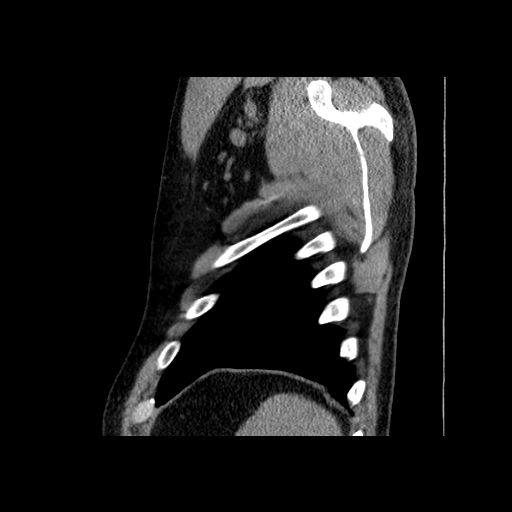
[im 160/172  mediastinal]
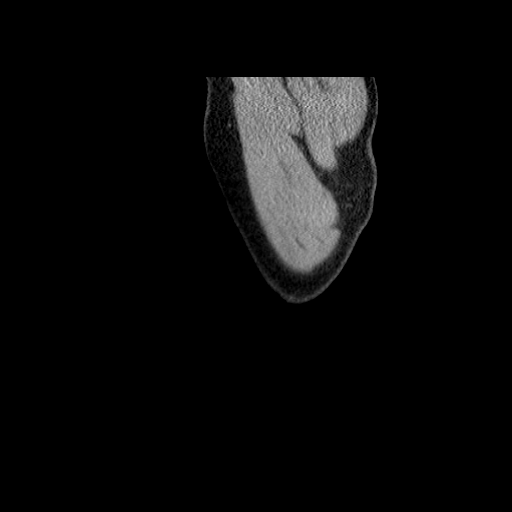

[14 of 30 positions shown; findings below may reference images not displayed]

FINDINGS: The patient has paraseptal emphysema, most prominent in
the lung apices.  No interstitial lung disease.  No infiltrates or
effusions.

There are multiple small bilateral pulmonary nodules including 5 mm
nodules on images 33 and 34 of series 4 in the right midzone as
well as 4 mm nodules on image number 40 and 44 in the right middle
and lower lobes respectively.  There is also a 4 mm nodule on image
number 47 in the left lower lobe.

There is no hilar or mediastinal adenopathy.  Heart size is normal.
The visible portion of the upper abdomen is normal.  No significant
osseous abnormality.
IMPRESSION: 1.  Paraseptal emphysema.
2.  No interstitial lung disease.
3.  Multiple tiny pulmonary nodules, indeterminate. With the
patient's smoking history, I recommend follow-up CT scan without
contrast in 6-12 months.

## 2013-04-26 MED ORDER — IOHEXOL 350 MG/ML SOLN
80.0000 mL | Freq: Once | INTRAVENOUS | Status: AC | PRN
  Administered 2013-04-26: 80 mL via INTRAVENOUS

## 2013-04-26 NOTE — Progress Notes (Signed)
Subjective:     Patient ID: Aaron [name], male   DOB: 06-12-1956, 57 y.o.   MRN: [id number]  HPI this 57 year old male returns now 8 months post endovascular stent graft repair of a 5 cm abdominal aortic aneurysm. He denies any abdominal or back pain. He has no claudication. In his bowel habits have been normal and he has increased his activity to his pre-surgical level.  Past Medical History  Diagnosis Date  . Hyperlipidemia   . Gout   . Rash 2011    left chest, biopsy 2012 Dr. Charlton Haws, results pending  . Dyspnea     normal PFT April 2012  . Tobacco abuse   . Pneumonia     hx of and hx of bronchitis  . GERD (gastroesophageal reflux disease)   . H/O hiatal hernia   . Arthritis     lower back  . Asthma     followed by Dr. Samara Snide  . HTN (hypertension)     hx of sees Dr. Alonna Minium  . Abdominal aortic aneurysm     sees Dr. Jacinto Halim for cardiac f/u, [phone]    History  Substance Use Topics  . Smoking status: Former Smoker -- 0.20 packs/day for 52.5 years    Types: Cigarettes    Quit date: 09/02/2012  . Smokeless tobacco: Current User    Types: Snuff  . Alcohol Use: No     Comment: past ETOH use quit 11-26-2009    Family History  Problem Relation Age of Onset  . Heart attack Father 47  . COPD Father   . Heart disease Father   . Asthma Daughter   . Cancer Mother     Breast    No Known Allergies  Current outpatient prescriptions:albuterol (PROVENTIL HFA;VENTOLIN HFA) 108 (90 BASE) MCG/ACT inhaler, Inhale 2 puffs into the lungs every 6 (six) hours as needed. For cough and wheezing., Disp: , Rfl: ;  allopurinol (ZYLOPRIM) 100 MG tablet, Take 100 mg by mouth daily. , Disp: , Rfl: ;  aspirin 81 MG tablet, Take 81 mg by mouth daily., Disp: , Rfl: ;  escitalopram (LEXAPRO) 10 MG tablet, Take 10 mg by mouth daily., Disp: , Rfl:  losartan (COZAAR) 50 MG tablet, Take 50 mg by mouth daily., Disp: , Rfl: ;  mometasone (NASONEX) 50 MCG/ACT nasal spray, Place 2 sprays into the  nose daily., Disp: , Rfl: ;  mometasone-formoterol (DULERA) 100-5 MCG/ACT AERO, Inhale 2 puffs into the lungs 2 (two) times daily., Disp: 1 Inhaler, Rfl: 6;  pantoprazole (PROTONIX) 40 MG tablet, Take 40 mg by mouth every morning. , Disp: , Rfl:  simvastatin (ZOCOR) 40 MG tablet, Take 40 mg by mouth at bedtime., Disp: , Rfl:   BP 137/89  Pulse 59  Resp 18  Ht 6' (1.829 m)  Wt 227 lb (102.967 kg)  BMI 30.78 kg/m2  Body mass index is 30.78 kg/(m^2).          Review of Systems denies chest pain. Does have mild dyspnea on exertion, productive cough, occasional numbness and tingling in the anterior thighs with ambulation. All other systems negative and complete review of systems     Objective:   Physical Exam blood pressure 137/89 heart rate 59 respirations 18 Gen.-alert and oriented x3 in no apparent distress HEENT normal for age Lungs no rhonchi or wheezing Cardiovascular regular rhythm no murmurs carotid pulses 3+ palpable no bruits audible Abdomen soft nontender no palpable masses Musculoskeletal free of  major deformities Skin clear -no rashes Neurologic  normal Lower extremities 3+ femoral and dorsalis pedis pulses palpable bilaterally with no edema  Today I ordered a CT angiogram of the abdomen and pelvis which I reviewed a computer. Aneurysm sac is contracted from 5 cm to 4.3 cm in maximum diameter. Stent graft is in excellent position. There is no evidence of endoleak.       Assessment:     Using well 8 months post aortic stent graft repair of abdominal aortic aneurysm    Plan:     Return in 6 months for CT angiogram of the abdomen and pelvis for continued followup His CT scan looks good we'll then begin annual surveillance with ultrasound alternating with CT scan

## 2013-04-27 ENCOUNTER — Telehealth: Payer: Self-pay | Admitting: Internal Medicine

## 2013-04-27 NOTE — Telephone Encounter (Signed)
Please give result  CT 04/26/13  - just mild emphysema c/w breathing test - small nodules that are unlikely lung cancer but need fu ct in 9 months  - wil discuss at fu  Dr. [name], M.D., [facility].C.P Pulmonary and Critical Care Medicine Staff Physician [facility] [place] [facility] Pager: [phone], If no answer or between  15:00h - 7:00h: call [phone]  04/27/2013 12:14 AM          Ct Chest Wo Contrast  04/26/2013   *RADIOLOGY REPORT*  Clinical Data: Persistent cough.  CT CHEST WITHOUT CONTRAST  Technique:  Multidetector CT imaging of the chest was performed following the standard protocol without IV contrast.  Comparison: Chest x-ray dated 12/21/2012  Findings: The patient has paraseptal emphysema, most prominent in the lung apices.  No interstitial lung disease.  No infiltrates or effusions.  There are multiple small bilateral pulmonary nodules including 5 mm nodules on images 33 and 34 of series 4 in the right midzone as well as 4 mm nodules on image number 40 and 44 in the right middle and lower lobes respectively.  There is also a 4 mm nodule on image number 47 in the left lower lobe.  There is no hilar or mediastinal adenopathy.  Heart size is normal. The visible portion of the upper abdomen is normal.  No significant osseous abnormality.  IMPRESSION:  1.  Paraseptal emphysema. 2.  No interstitial lung disease. 3.  Multiple tiny pulmonary nodules, indeterminate. With the patient's smoking history, I recommend follow-up CT scan without contrast in 6-12 months.   Original Report Authenticated By: Francene Boyers, M.D.   Ct Angio Abd/pel W/ And/or W/o  04/26/2013   *RADIOLOGY REPORT*  Clinical Data:  Status post EVAR to treat abdominal aortic aneurysm on 09/22/2012.  CT ANGIOGRAPHY ABDOMEN AND PELVIS  Technique:  Multidetector CT imaging of the abdomen and pelvis was performed using the standard protocol during bolus administration of intravenous  contrast.  Multiplanar reconstructed images including MIPs were obtained and reviewed to evaluate the vascular anatomy.  Contrast: 80mL OMNIPAQUE IOHEXOL 350 MG/ML SOLN  Comparison:  10/26/2012.  Findings:  The aortic endograft shows stable positioning and normal patency without evidence of complication.  No endoleak is identified.  There is further diminishment in size of the thrombosed aneurysm sac which now measures approximately 3.9 x 4.3 cm (previously 4.8 x 5.0 cm).  Native iliac, femoral and visceral arteries show stable and normal patency.  Stable appearance of early bifurcation of the right renal artery into two separate branches supplying the right kidney. Stable retrograde collateral reconstitution of IMA branches without evidence of IMA supplied endoleak.  No incidental nonvascular findings are identified in the abdomen or pelvis.  No incidental masses or enlarged lymph nodes.  No hernias are identified.  No abnormal fluid collections.  Stable mild degenerative disease of the lower lumbar spine.   Review of the MIP images confirms the above findings.  IMPRESSION: No complications after EVAR with further diminishment in size of the thrombosed aneurysm sac which now measures 4.3 cm in greatest diameter compared to 5.0 cm previously.  No endoleak is identified.   Original Report Authenticated By: Irish Lack, M.D.

## 2013-04-29 NOTE — Telephone Encounter (Signed)
Pt advised. Jennifer Castillo, CMA  

## 2013-05-02 ENCOUNTER — Encounter: Payer: Self-pay | Admitting: *Deleted

## 2013-05-03 ENCOUNTER — Encounter: Payer: Self-pay | Admitting: Internal Medicine

## 2013-05-03 ENCOUNTER — Ambulatory Visit (INDEPENDENT_AMBULATORY_CARE_PROVIDER_SITE_OTHER): Payer: 59 | Admitting: Internal Medicine

## 2013-05-03 VITALS — BP 116/84 | HR 57 | Temp 97.5°F | Resp 20 | Ht 72.0 in | Wt 230.0 lb

## 2013-05-03 DIAGNOSIS — I10 Essential (primary) hypertension: Secondary | ICD-10-CM

## 2013-05-03 DIAGNOSIS — J069 Acute upper respiratory infection, unspecified: Secondary | ICD-10-CM

## 2013-05-03 MED ORDER — HYDROCODONE-CHLORPHENIRAMINE 5-4 MG/5ML PO SOLN: 5.0000 mL | Freq: Two times a day (BID) | ORAL | Status: DC

## 2013-05-03 MED ORDER — AZITHROMYCIN 250 MG PO TABS: ORAL_TABLET | ORAL | Status: DC

## 2013-05-03 NOTE — Progress Notes (Signed)
  Subjective:    Patient ID: Aaron [name], male    DOB: [birth year], 57 y.o.   MRN: [id number]  HPI 57 y/o male patient is here with complaints of cough with yellowish- brown phlegm for a week. He has been feeling congested in his nasal sinuses and chest. Denies any hemoptysis, fever or chills. Has runny nose. No ear drainage or pain. No sore throat.  Review of Systems  Appetite is good No chest pain, SOB No nausea or vomiting No abdominal pain and bowel/ bladder complaints    Objective:   Physical Exam BP 116/84  Pulse 57  Temp(Src) 97.5 F (36.4 C) (Oral)  Resp 20  Ht 6' (1.829 m)  Wt 230 lb (104.327 kg)  BMI 31.19 kg/m2  SpO2 98%  gen- adult male in NAD heent- no sinus tenderness, normal ear exam, no palpable LAD, mild oropharyngeal erythema cvs- n s1,s2, rrr respi- b/l CTA, no wheeze or rhonchi or crackles Ext- able to move all 4     Assessment & Plan:   Acute URI- will treat him empirically with z pack for 5 days. Given chlorpheniramine- hydrocodone 5 ml bid for a week. Reassess if no imporvement

## 2013-05-05 ENCOUNTER — Telehealth: Payer: Self-pay | Admitting: *Deleted

## 2013-05-05 NOTE — Telephone Encounter (Signed)
Spoke with patient and told him, to wait and see if he gets better over the weekend, That he could take Mucinex DM during the day. Do not take that hydrocodone cough syrup in the day time

## 2013-05-05 NOTE — Telephone Encounter (Signed)
LMOVM to call back 

## 2013-05-05 NOTE — Telephone Encounter (Signed)
Dr Glade Lloyd gave patient a Z-pack and last night he coughed all night long would like something called in for that. He did used his inhaler last night, but still would like something called    Patient did state that he has the cough syrup with codeine in it, he can only take that every 6 hours.  [facility]

## 2013-05-25 ENCOUNTER — Ambulatory Visit (INDEPENDENT_AMBULATORY_CARE_PROVIDER_SITE_OTHER): Payer: Managed Care, Other (non HMO) | Admitting: Internal Medicine

## 2013-05-25 ENCOUNTER — Encounter: Payer: Self-pay | Admitting: Internal Medicine

## 2013-05-25 VITALS — BP 122/76 | HR 97 | Temp 97.6°F | Ht 72.0 in | Wt 231.2 lb

## 2013-05-25 DIAGNOSIS — R918 Other nonspecific abnormal finding of lung field: Secondary | ICD-10-CM

## 2013-05-25 DIAGNOSIS — R05 Cough: Secondary | ICD-10-CM

## 2013-05-25 DIAGNOSIS — R053 Chronic cough: Secondary | ICD-10-CM

## 2013-05-25 DIAGNOSIS — R059 Cough, unspecified: Secondary | ICD-10-CM

## 2013-05-25 DIAGNOSIS — J438 Other emphysema: Secondary | ICD-10-CM

## 2013-05-25 NOTE — Progress Notes (Signed)
Subjective:    Patient ID: Aaron [name], male    DOB: August 18, 1956, 57 y.o.   MRN: [id number]  HPI  HPI #Smoker  - 52 pack. Quit October 2013    #Ex-etoh  - quit 2011  #ASthma/Emphysema Dyspnea  - normal cardiac stress test 2006 and in October 2013  - CXR 2011 - hyperinflation  - PFTs April 2012 (off advair ) - normal.  -  methacholine challenge test 03/05/2011: POSITIVE FOR ASTHMA (PC 20 <4). - CT June 2014: mild emphysema (dad had emphysema)  #Maintenance of health  - pneumovax 2012       OV 03/21/2013  Routine office visit for the above. He presents with his wife Aaron [name] who is here with him for the first time.  - He underwent abdominal Arctic aneurysm repair in October 2013. Surgery was uneventful. He has been doing well. It appears that postoperatively he was placed on lisinopril for hypertension control. Sometime shortly after that but especially so in the last 1-2 months he and his wife reporting insidious onset of cough that is associated with sputum and some wheezing. that is present all day. Rated as moderate in severity. Associated volume of sputum is small and clear. There is no associated hemoptysis no weight loss or wheezing. RSI cough score is 28 and suggest irritable larynx cough. Am happy to note that he quit smoking October 2013. He is also compliant with his Advair which I do note is a dry powdered inhaler   REC   #cough  - please stop lisinopril 10mg ; suspect this is contributing to cough  - instead start losartan 25mg  daily  - continue advair 250/50, 1 puff twice daily  - return in 1 month  - cough score at followup  - spirometry at followup  #BP  - monitor BP as before per advice of your cardiologist/primary care but with the changes  - any BP issues call them  #Followup  1 month with spirometry and Cough score at followup   OV 04/20/2013 Presents with wife Aaron [name]. . He's here to evaluate   - cough and dyspnea after stopping lisinopril.  - But  also checking his blood pressure after changing his lisinopril to losartan. - Review his smoking: Still in remission since October 2013  Currently he is on  advair: Spirometry shows an FEV1 of 3.3L/79% and reduced ratio. Consistent with mild early obstruction possibly. Still with mucus and cough. 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 His RSI cough score is improved to 14 shows a 50% reduction in cough severe he but subjectively he does not feel any better. In fact is complaining of mucus production. He is frustrated that despite quitting smoking that cough has not resolved and he feels that he might as well go back to smoking again.   REC Chronic cough with asthma - Stop Advair - Start Dulera 100/52 puff 2 times daily; use with a spacer - Use albuterol as needed - Start nasal steroid Nasacort or Nasonex over-the-counter 2 squirts in each nostril once daily - Start 3% hypertonic saline nasal spray made that company called [employer] med 2 squirts in each nostril once at night - Avoid colas spices cheeses pizzas and fried food - Continue acid reflux Protonix - Have CT scan of the chest on 04/26/2013   Followup - 1 month with cough score - Shortness of breath still persists at followup we'll do bike pulmonary stress test   Telephone call June 2014 CT 04/26/13  - just  mild emphysema c/w breathing test - small nodules max 5mm that are unlikely lung cancer but need fu ct in 9 months   -  uThere are multiple small bilateral pulmonary nodules including 5 mm  nodules on images 33 and 34 of series 4 in the right midzone as  well as 4 mm nodules on image number 40 and 44 in the right middle  and lower lobes respectively. There is also a 4 mm nodule on image  number 47 in the left lower lobe.    OV 05/25/2013  .DYspnea:  Much improved. Moving furniture and heavy objects. No dyspnea now after starting dulera. CT June 2014 - shows mild empysema  Cough: hugely better. RSI Cough score down to 8.  Only mild cough. Feels current quality of life is good. Feels dulera helped a lot. Using OTC nasal steroid, Not using saloine nasal spray  New issue: severeal scatterred pulmnary nodules < 5 mm both sides in CT June 2014   Dr Gretta Cool Reflux Symptom Index (> 13-15 suggestive of LPR cough) 0 -> 5  =  none ->severe problem 03/21/2013 On ace inhibitor and advair disck 04/20/2013 After stopping ace inhibitor 05/25/2013 After starting dulera, OTC nasal steroid, continued ppi  Hoarseness of problem with voice 1 3 0  Clearing  Of Throat 5 3 2   Excess throat mucus or feeling of post nasal drip 5 3 4   Difficulty swallowing food, liquid or tablets 2 0 0  Cough after eating or lying down 4 1 0  Breathing difficulties or choking episodes 3 4 0  Troublesome or annoying cough 1 0 0  Sensation of something sticking in throat or lump in throat 3 0 0  Heartburn, chest pain, indigestion, or stomach acid coming up 4 0 2  TOTAL 28 14 8       Kouffman Reflux v Neurogenic Cough Differentiator Reflux 04/20/2013  05/25/2013   Do you awaken from a sound sleep coughing violently?                            With trouble breathing? n n  Do you have choking episodes when you cannot  Get enough air, gasping for air ?              n n  Do you usually cough when you lie down into  The bed, or when you just lie down to rest ?                          n n  Do you usually cough after meals or eating?         n n  Do you cough when (or after) you bend over?    n n  GERD SCORE  0 0  Kouffman Reflux v Neurogenic Cough Differentiator Neurogenic   Do you more-or-less cough all day long? n n  Does change of temperature make you cough? n n  Does laughing or chuckling cause you to cough? nn n  Do fumes (perfume, automobile fumes, burned  Toast, etc.,) cause you to cough ?      n n  Does speaking, singing, or talking on the phone cause you to cough   ?               n n  Neurogenic/Airway score 0 0     Past, Family,  Social reviewed: His cardiologist Dr.Ganji  is placed on antidepressant Lexapro a week ago      Review of Systems  Constitutional: Negative for fever and unexpected weight change.  HENT: Negative for ear pain, nosebleeds, congestion, sore throat, rhinorrhea, sneezing, trouble swallowing, dental problem, postnasal drip and sinus pressure.   Eyes: Negative for redness and itching.  Respiratory: Positive for cough. Negative for chest tightness, shortness of breath and wheezing.   Cardiovascular: Negative for palpitations and leg swelling.  Gastrointestinal: Negative for nausea and vomiting.  Genitourinary: Negative for dysuria.  Musculoskeletal: Negative for joint swelling.  Skin: Negative for rash.  Neurological: Negative for headaches.  Hematological: Does not bruise/bleed easily.  Psychiatric/Behavioral: Negative for dysphoric mood. The patient is not nervous/anxious.    Current outpatient prescriptions:albuterol (PROVENTIL HFA;VENTOLIN HFA) 108 (90 BASE) MCG/ACT inhaler, Inhale 2 puffs into the lungs every 6 (six) hours as needed. For cough and wheezing., Disp: , Rfl: ;  allopurinol (ZYLOPRIM) 100 MG tablet, Take 100 mg by mouth daily. , Disp: , Rfl: ;  aspirin 81 MG tablet, Take 81 mg by mouth daily., Disp: , Rfl:  chlorpheniramine-HYDROcodone (TUSSIONEX) 10-8 MG/5ML LQCR, Take 5 mLs by mouth. Take one teaspoonful every 12 hours as needed for cough., Disp: , Rfl: ;  escitalopram (LEXAPRO) 10 MG tablet, Take 10 mg by mouth daily., Disp: , Rfl: ;  losartan (COZAAR) 50 MG tablet, Take 50 mg by mouth daily., Disp: , Rfl: ;  mometasone (NASONEX) 50 MCG/ACT nasal spray, Place 2 sprays into the nose daily., Disp: , Rfl:  mometasone-formoterol (DULERA) 100-5 MCG/ACT AERO, Inhale 2 puffs into the lungs 2 (two) times daily., Disp: 1 Inhaler, Rfl: 6;  pantoprazole (PROTONIX) 40 MG tablet, Take 40 mg by mouth every morning. , Disp: , Rfl: ;  simvastatin (ZOCOR) 40 MG tablet, Take 40 mg by mouth at  bedtime., Disp: , Rfl:       Physical Exam  Filed Vitals:   05/25/13 1626 05/25/13 1629  BP:  122/76  Pulse:  97  Temp: 97.6 F (36.4 C)   TempSrc: Oral   Height: 6' (1.829 m)   Weight: 231 lb 3.2 oz (104.872 kg)   SpO2:  97%    Constitutional: He is oriented to person, place, and time. He appears well-developed and well-nourished. No distress.  HENT:  Head: Normocephalic and atraumatic.  Right Ear: External ear normal.  Left Ear: External ear normal.  Mouth/Throat: Oropharynx is clear and moist. No oropharyngeal exudate.  Eyes: Conjunctivae and EOM are normal. Pupils are equal, round, and reactive to light. Right eye exhibits no discharge. Left eye exhibits no discharge. No scleral icterus.  Neck: Normal range of motion. Neck supple. No JVD present. No tracheal deviation present. No thyromegaly present.  Cardiovascular: Normal rate, regular rhythm and intact distal pulses.  Exam reveals no gallop and no friction rub.   No murmur heard. Pulmonary/Chest: Effort normal and breath sounds normal. No respiratory distress. He has no wheezes. He has no rales. He exhibits no tenderness.  Abdominal: Soft. Bowel sounds are normal. He exhibits no distension and no mass. There is no tenderness. There is no rebound and no guarding.  Musculoskeletal: Normal range of motion. He exhibits no edema and no tenderness.  Lymphadenopathy:    He has no cervical adenopathy.  Neurological: He is alert and oriented to person, place, and time. He has normal reflexes. No cranial nerve deficit. Coordination normal.  Skin: Skin is warm and dry. No rash noted. He is not diaphoretic. No erythema. No pallor.  Psychiatric: He has a normal mood and affect. His behavior is normal. Judgment and thought content normal.           Assessment & Plan:

## 2013-05-25 NOTE — Patient Instructions (Addendum)
#  Chronic cough with asthma/emphysema - Glad you are much better - Continue Dulera 100/52 puff 2 times daily; use with a spacer - Use albuterol as needed - Continue steroid Nasacort or Nasonex over-the-counter 2 squirts in each nostril once daily - Start 3% hypertonic saline nasal spray made that company called [employer] 2 squirts in each nostril once at night - Avoid colas spices cheeses pizzas and fried food - Continue acid reflux Protonix  #Lung nodule - Have CT scan without contrast of the chest in 9 months  Followup - 9 month with cough score

## 2013-05-26 DIAGNOSIS — R918 Other nonspecific abnormal finding of lung field: Secondary | ICD-10-CM | POA: Insufficient documentation

## 2013-05-26 NOTE — Assessment & Plan Note (Signed)
#  Chronic cough with asthma/emphysema - Glad you are much better - Continue Dulera 100/52 puff 2 times daily; use with a spacer - Use albuterol as needed - Continue steroid Nasacort or Nasonex over-the-counter 2 squirts in each nostril once daily - Start 3% hypertonic saline nasal spray made that company called [employer] med 2 squirts in each nostril once at night - Avoid colas spices cheeses pizzas and fried food - Continue acid reflux Protonix  FU 9 months   > 50% of this > 25 min visit spent in face to face counseling (15 min visit converted to 25 min)

## 2013-05-26 NOTE — Assessment & Plan Note (Signed)
 #  Lung nodule - Have CT scan without contrast of the chest in 9 months  Followup - 9 month with cough score

## 2013-06-02 ENCOUNTER — Other Ambulatory Visit: Payer: Self-pay | Admitting: Geriatric Medicine

## 2013-06-02 MED ORDER — ALLOPURINOL 100 MG PO TABS: 100.0000 mg | ORAL_TABLET | Freq: Every day | ORAL | Status: DC

## 2013-06-07 ENCOUNTER — Ambulatory Visit (INDEPENDENT_AMBULATORY_CARE_PROVIDER_SITE_OTHER): Payer: 59 | Admitting: Internal Medicine

## 2013-06-07 VITALS — BP 140/82 | HR 76 | Temp 98.0°F | Resp 18 | Ht 72.0 in | Wt 234.4 lb

## 2013-06-07 DIAGNOSIS — IMO0001 Reserved for inherently not codable concepts without codable children: Secondary | ICD-10-CM

## 2013-06-07 DIAGNOSIS — M7918 Myalgia, other site: Secondary | ICD-10-CM

## 2013-06-07 DIAGNOSIS — I10 Essential (primary) hypertension: Secondary | ICD-10-CM

## 2013-06-07 DIAGNOSIS — M549 Dorsalgia, unspecified: Secondary | ICD-10-CM | POA: Insufficient documentation

## 2013-06-07 MED ORDER — TRAMADOL HCL 50 MG PO TABS: 50.0000 mg | ORAL_TABLET | Freq: Three times a day (TID) | ORAL | Status: DC | PRN

## 2013-06-07 MED ORDER — METHOCARBAMOL 500 MG PO TABS: 500.0000 mg | ORAL_TABLET | Freq: Three times a day (TID) | ORAL | Status: DC | PRN

## 2013-06-07 NOTE — Progress Notes (Signed)
Patient ID: Aaron [name], male   DOB: Sep 18, 1956, 57 y.o.   MRN: [id number]  Chief Complaint  Patient presents with  . Back Pain    pain since Sunday, med refill on protonix(90 day)   HPI- patient was getting out of his car on Sunday when he experienced pain in his right lower back area. He grades pain as 5-6/10 at present. The pain gets aggrevated with movement. He denies any radiation of the pain. No numbness or tingling reported. No focal limb weakness. 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  Review of Systems  Constitutional: Negative for fever, weight loss and diaphoresis.  HENT: Negative for sore throat.   Respiratory: Negative for shortness of breath.   Cardiovascular: Negative for chest pain.  Genitourinary: Negative for dysuria.  Musculoskeletal: Positive for back pain. Negative for falls.  Skin: Negative for itching and rash.  Neurological: Negative for dizziness, tingling, weakness and headaches.  Psychiatric/Behavioral: Negative for depression.   No Known Allergies  BP 140/82  Pulse 76  Temp(Src) 98 F (36.7 C) (Oral)  Resp 18  Ht 6' (1.829 m)  Wt 234 lb 6.4 oz (106.323 kg)  BMI 31.78 kg/m2  gen- adult male in NAD HEENT- no pallor, icterus, LAD cvs- ns 1,s2, rrr abdo- bs+, soft, non tender Ext- able to move all 4, no spinal tenderness.right lumbar paravertebral tenderness present Neuro- aaox 3  A/P-  Back pain- has paraspinal tenderness likely from musculoskeletal injury. Will have him on prn tramadol with muscle relaxant for now. To use ice pack and give rest to help bring down the inflammation. Avoid lifiting more than 10 pounds or prolonged bending for now. A work note has been provided  HTN- bp remains stable. Will not cnage current regimen. Avoid weight lifting for exercise for now with muscle injury. Cut down salt intake. Has stopped smoking

## 2013-06-07 NOTE — Patient Instructions (Signed)
Musculoskeletal Pain °Musculoskeletal pain is muscle and boney aches and pains. These pains can occur in any part of the body. Your caregiver may treat you without knowing the cause of the pain. They may treat you if blood or urine tests, X-rays, and other tests were normal.  °CAUSES °There is often not a definite cause or reason for these pains. These pains may be caused by a type of germ (virus). The discomfort may also come from overuse. Overuse includes working out too hard when your body is not fit. Boney aches also come from weather changes. Bone is sensitive to atmospheric pressure changes. °HOME CARE INSTRUCTIONS  °· Ask when your test results will be ready. Make sure you get your test results. °· Only take over-the-counter or prescription medicines for pain, discomfort, or fever as directed by your caregiver. If you were given medications for your condition, do not drive, operate machinery or power tools, or sign legal documents for 24 hours. Do not drink alcohol. Do not take sleeping pills or other medications that may interfere with treatment. °· Continue all activities unless the activities cause more pain. When the pain lessens, slowly resume normal activities. Gradually increase the intensity and duration of the activities or exercise. °· During periods of severe pain, bed rest may be helpful. Lay or sit in any position that is comfortable. °· Putting ice on the injured area. °· Put ice in a bag. °· Place a towel between your skin and the bag. °· Leave the ice on for 15 to 20 minutes, 3 to 4 times a day. °· Follow up with your caregiver for continued problems and no reason can be found for the pain. If the pain becomes worse or does not go away, it may be necessary to repeat tests or do additional testing. Your caregiver may need to look further for a possible cause. °SEEK IMMEDIATE MEDICAL CARE IF: °· You have pain that is getting worse and is not relieved by medications. °· You develop chest pain  that is associated with shortness or breath, sweating, feeling sick to your stomach (nauseous), or throw up (vomit). °· Your pain becomes localized to the abdomen. °· You develop any new symptoms that seem different or that concern you. °MAKE SURE YOU:  °· Understand these instructions. °· Will watch your condition. °· Will get help right away if you are not doing well or get worse. °Document Released: 11/10/2005 Document Revised: 02/02/2012 Document Reviewed: 06/30/2008 °[employer]® Patient Information ©2014 [employer]. ° °

## 2013-06-17 ENCOUNTER — Other Ambulatory Visit: Payer: Self-pay | Admitting: Geriatric Medicine

## 2013-06-17 MED ORDER — PANTOPRAZOLE SODIUM 40 MG PO TBEC: 40.0000 mg | DELAYED_RELEASE_TABLET | Freq: Every morning | ORAL | Status: DC

## 2013-06-29 ENCOUNTER — Encounter: Payer: Self-pay | Admitting: Internal Medicine

## 2013-06-29 ENCOUNTER — Ambulatory Visit (INDEPENDENT_AMBULATORY_CARE_PROVIDER_SITE_OTHER): Payer: 59 | Admitting: Internal Medicine

## 2013-06-29 VITALS — BP 144/88 | HR 65 | Temp 99.0°F | Resp 14 | Ht 72.0 in | Wt 231.8 lb

## 2013-06-29 DIAGNOSIS — R7309 Other abnormal glucose: Secondary | ICD-10-CM

## 2013-06-29 DIAGNOSIS — J4540 Moderate persistent asthma, uncomplicated: Secondary | ICD-10-CM

## 2013-06-29 DIAGNOSIS — F3289 Other specified depressive episodes: Secondary | ICD-10-CM

## 2013-06-29 DIAGNOSIS — E785 Hyperlipidemia, unspecified: Secondary | ICD-10-CM

## 2013-06-29 DIAGNOSIS — F329 Major depressive disorder, single episode, unspecified: Secondary | ICD-10-CM

## 2013-06-29 DIAGNOSIS — I719 Aortic aneurysm of unspecified site, without rupture: Secondary | ICD-10-CM

## 2013-06-29 DIAGNOSIS — F32A Depression, unspecified: Secondary | ICD-10-CM | POA: Insufficient documentation

## 2013-06-29 DIAGNOSIS — I10 Essential (primary) hypertension: Secondary | ICD-10-CM

## 2013-06-29 DIAGNOSIS — J45909 Unspecified asthma, uncomplicated: Secondary | ICD-10-CM

## 2013-06-29 DIAGNOSIS — R7303 Prediabetes: Secondary | ICD-10-CM

## 2013-06-29 NOTE — Progress Notes (Signed)
Patient ID: Aaron [name], male   DOB: [birth year], 57 y.o.   MRN: [id number]  Chief Complaint  Patient presents with  . Medical Managment of Chronic Issues   HPI 57 y/o male here for routine follow up. He has been compliant with his medications.   Review of Systems  Constitutional:       has been exercising some HENT: Negative for congestion and mouth sores.   Eyes: Negative.   Respiratory: Negative for chest tightness, shortness of breath and wheezing.   Cardiovascular: Negative for chest pain, palpitations and leg swelling.  Gastrointestinal: Negative for abdominal pain, constipation and abdominal distention.  Endocrine: Negative.   Genitourinary: Negative for dysuria.  Musculoskeletal: Negative for back pain and arthralgias.  Neurological: Negative for dizziness and weakness.  Hematological: Negative.  Negative for adenopathy.  Psychiatric/Behavioral: Negative for behavioral problems and sleep disturbance.   No Known Allergies  BP 144/88  Pulse 65  Temp(Src) 99 F (37.2 C) (Oral)  Resp 14  Ht 6' (1.829 m)  Wt 231 lb 12.8 oz (105.144 kg)  BMI 31.43 kg/m2  Constitutional: He is oriented to person, place, and time. He appears well-developed and well-nourished. No distress.  HENT:   Head: Normocephalic and atraumatic.   Mouth/Throat: Oropharynx is clear and moist.  Eyes: Conjunctivae are normal. Pupils are equal, round, and reactive to light.  Neck: Normal range of motion. Neck supple. No thyromegaly present.  Cardiovascular: Normal rate and regular rhythm.   Pulmonary/Chest: Effort normal and breath sounds normal.  Abdominal: Soft. Bowel sounds are normal. Hernia present, reducible Musculoskeletal: Normal range of motion.  Lymphadenopathy:    He has no cervical adenopathy.  Neurological: He is alert and oriented to person, place, and time.  Skin: Skin is warm and dry. He is not diaphoretic.  Psychiatric: He has a normal mood and affect.   Labs-  CBC    Component  Value Date/Time   WBC 6.0 02/16/2013 0829   WBC 14.8* 09/23/2012 0435   RBC 5.02 02/16/2013 0829   RBC 4.27 09/23/2012 0435   HGB 15.1 02/16/2013 0829   HCT 43.5 02/16/2013 0829   PLT 231 09/23/2012 0435   MCV 87 02/16/2013 0829   MCH 30.1 02/16/2013 0829   MCH 30.4 09/23/2012 0435   MCHC 34.7 02/16/2013 0829   MCHC 34.4 09/23/2012 0435   RDW 13.1 02/16/2013 0829   RDW 12.5 09/23/2012 0435   LYMPHSABS 1.6 02/16/2013 0829   EOSABS 0.1 02/16/2013 0829   BASOSABS 0.0 02/16/2013 0829    CMP     Component Value Date/Time   NA 143 02/16/2013 0829   NA 139 09/23/2012 0435   K 4.8 02/16/2013 0829   CL 105 02/16/2013 0829   CO2 26 02/16/2013 0829   GLUCOSE 109* 02/16/2013 0829   GLUCOSE 129* 09/23/2012 0435   BUN 12 02/16/2013 0829   BUN 13 09/23/2012 0435   CREATININE 1.04 02/16/2013 0829   CALCIUM 9.9 02/16/2013 0829   PROT 7.5 02/16/2013 0829   AST 26 02/16/2013 0829   ALT 30 02/16/2013 0829   ALKPHOS 90 02/16/2013 0829   BILITOT 0.6 02/16/2013 0829   GFRNONAA 81 02/16/2013 0829   GFRAA 94 02/16/2013 0829   ASSESSMENT/PLAN  HYPERTENSION coontinue current regimen, tolerating it well. Continue cozaar  Asthma, moderate persistent Normal PFTs. Follows with pulmonary. On dulera and prn albuterol. Encouraged to use rescue albuterol if needed prior to exercise  HYPERLIPIDEMIA Continue statin for now  Depression Continue lexapro. He feels his mood  has been stable  GERD Continue protonix and this has been helpful  Aneurysm of aorta S/p repair with stent placed and is on ASA 81 mg daily and statin 40 mg daily. No complications. stable  Gouty arthropathy No recent gout attack. Continue allopurinol for now

## 2013-08-03 ENCOUNTER — Other Ambulatory Visit: Payer: Self-pay | Admitting: *Deleted

## 2013-08-03 MED ORDER — LOSARTAN POTASSIUM 50 MG PO TABS: 50.0000 mg | ORAL_TABLET | Freq: Every day | ORAL | Status: DC

## 2013-08-11 ENCOUNTER — Encounter: Payer: Self-pay | Admitting: Nurse Practitioner

## 2013-08-11 ENCOUNTER — Ambulatory Visit (INDEPENDENT_AMBULATORY_CARE_PROVIDER_SITE_OTHER): Payer: 59 | Admitting: Nurse Practitioner

## 2013-08-11 VITALS — BP 120/74 | HR 65 | Temp 96.7°F | Wt 235.0 lb

## 2013-08-11 DIAGNOSIS — J40 Bronchitis, not specified as acute or chronic: Secondary | ICD-10-CM

## 2013-08-11 NOTE — Progress Notes (Signed)
Patient ID: Aaron [name], male   DOB: [birth year], 57 y.o.   MRN: [id number]   No Known Allergies  Chief Complaint  Patient presents with  . URI    dry cough, chest tightness, diarrhea, sick on stomach, and sweats. Onset Monday    HPI: Patient is a 57 y.o. male seen in the office today due to not feeling well for 3 days.  Started felling bad 3 days ago; tightness in chest and congestion; cough nonproductive; sneezing; denies sore ; Had diarrhea 3 days; no N/V, no changes in appetite.  Has taken mucinex x 5 doses  No shortness of breath; no fevers or chills   Review of Systems:  Review of Systems  Constitutional: Negative for fever, chills and malaise/fatigue.  HENT: Positive for congestion. Negative for ear pain, sore throat and ear discharge.   Respiratory: Positive for cough. Negative for sputum production, shortness of breath and wheezing.   Cardiovascular: Negative for chest pain, palpitations and leg swelling.  Gastrointestinal: Positive for diarrhea. Negative for heartburn, nausea, vomiting and abdominal pain.  Neurological: Negative for dizziness and headaches.     Past Medical History  Diagnosis Date  . Hyperlipidemia   . Gout   . Rash 2011    left chest, biopsy 2012 Dr. Charlton Haws, results pending  . Dyspnea     normal PFT April 2012  . Tobacco abuse   . Pneumonia     hx of and hx of bronchitis  . GERD (gastroesophageal reflux disease)   . H/O hiatal hernia   . Arthritis     lower back  . Asthma     followed by Dr. Samara Snide  . HTN (hypertension)     hx of sees Dr. Alonna Minium  . Abdominal aortic aneurysm     sees Dr. Jacinto Halim for cardiac f/u, [phone]  . Other abnormal blood chemistry   . Insomnia, unspecified   . External hemorrhoids without mention of complication   . Other testicular dysfunction   . Other specified erythematous condition(695.89)   . Other dyspnea and respiratory abnormality   . Prostatitis, unspecified   . Other malaise and fatigue    . Chronic airway obstruction, not elsewhere classified    Past Surgical History  Procedure Laterality Date  . Appendectomy    . Tonsillectomy    . Wrist surgery      left  . Toe surgery  01/2012    joint of left great toe  . Tympanoplasty    . Abdominal aortic endovascular stent graft  09/22/2012    Procedure: ABDOMINAL AORTIC ENDOVASCULAR STENT GRAFT;  Surgeon: Pryor Ochoa, MD;  Location: [facility] OR;  Service: Vascular;  Laterality: N/A;  GORE; ultrasound guided.   Social History:   reports that he quit smoking about 11 months ago. His smoking use included Cigarettes. He has a 10.5 pack-year smoking history. His smokeless tobacco use includes Snuff. He reports that he does not drink alcohol or use illicit drugs.  Family History  Problem Relation Age of Onset  . Heart attack Father 33  . COPD Father   . Heart disease Father   . Asthma Daughter   . Cancer Mother     Breast    Medications: Patient's Medications  New Prescriptions   No medications on file  Previous Medications   ALBUTEROL (PROVENTIL HFA;VENTOLIN HFA) 108 (90 BASE) MCG/ACT INHALER    Inhale 2 puffs into the lungs every 6 (six) hours as needed. For cough and wheezing.   ALLOPURINOL (ZYLOPRIM)  100 MG TABLET    Take 1 tablet (100 mg total) by mouth daily.   ASPIRIN 81 MG TABLET    Take 81 mg by mouth daily.   ESCITALOPRAM (LEXAPRO) 10 MG TABLET    Take 10 mg by mouth daily.   LOSARTAN (COZAAR) 50 MG TABLET    Take 1 tablet (50 mg total) by mouth daily.   MOMETASONE (NASONEX) 50 MCG/ACT NASAL SPRAY    Place 2 sprays into the nose daily.   MOMETASONE-FORMOTEROL (DULERA) 100-5 MCG/ACT AERO    Inhale 2 puffs into the lungs 2 (two) times daily.   PANTOPRAZOLE (PROTONIX) 40 MG TABLET    Take 1 tablet (40 mg total) by mouth every morning.   SIMVASTATIN (ZOCOR) 40 MG TABLET    Take 40 mg by mouth at bedtime.  Modified Medications   No medications on file  Discontinued Medications   No medications on file     Physical  Exam:  Filed Vitals:   08/11/13 1151  BP: 120/74  Pulse: 65  Temp: 96.7 F (35.9 C)  TempSrc: Oral  Weight: 235 lb (106.595 kg)  SpO2: 97%    Physical Exam  Vitals reviewed. Constitutional: He is oriented to person, place, and time and well-developed, well-nourished, and in no distress. No distress.  HENT:  Head: Normocephalic and atraumatic.  Right Ear: External ear normal.  Left Ear: External ear normal.  Nose: Nose normal.  Mouth/Throat: Oropharynx is clear and moist. No oropharyngeal exudate.  Eyes: Conjunctivae and EOM are normal. Pupils are equal, round, and reactive to light.  Neck: Normal range of motion. Neck supple.  Cardiovascular: Normal rate, regular rhythm and normal heart sounds.   Pulmonary/Chest: Effort normal and breath sounds normal. No respiratory distress. He has no wheezes. He has no rales.  Abdominal: Soft. Bowel sounds are normal. He exhibits no distension. There is no tenderness.  Musculoskeletal: Normal range of motion.  Neurological: He is alert and oriented to person, place, and time.  Skin: Skin is warm and dry. He is not diaphoretic.     Labs reviewed: Basic Metabolic Panel:  Recent Labs  62/13/08 1100 09/23/12 0435 02/16/13 0829  NA 143 139 143  K 4.1 4.0 4.8  CL 112 104 105  CO2 24 27 26   GLUCOSE 107* 129* 109*  BUN 13 13 12   CREATININE 0.85 0.90 1.04  CALCIUM 8.0* 9.1 9.9  MG 1.7  --   --    Liver Function Tests:  Recent Labs  02/16/13 0829  AST 26  ALT 30  ALKPHOS 90  BILITOT 0.6  PROT 7.5   No results found for this basename: LIPASE, AMYLASE,  in the last 8760 hours No results found for this basename: AMMONIA,  in the last 8760 hours CBC:  Recent Labs  09/22/12 1100 09/23/12 0435 02/16/13 0829  WBC 7.2 14.8* 6.0  NEUTROABS  --   --  3.6  HGB 12.3* 13.0 15.1  HCT 36.6* 37.8* 43.5  MCV 89.3 88.5 87  PLT 196 231  --    Lipid Panel:  Recent Labs  02/16/13 0829  HDL 35*  LDLCALC 90  TRIG 657  CHOLHDL  4.1     Assessment/Plan 1. Bronchitis Most likely viral based on symptoms and duration; symptom management at this point; to cont mucinex DM twice daily and drink plenty of fluids and to have good nutrition and can take MVI  florastor samples given due to GI symptoms No antibiotic indicated at this time; given  warning signs to seek immediate medical attention but to call if no improvements or feeling worse in 3-4 days

## 2013-08-11 NOTE — Patient Instructions (Addendum)
Call if no improvement or getting worse on Monday Cont mucinex DM with full glass of water twice daily for 1 week   Viral Syndrome Viral Syndrome. It is the most common infection causing "colds" and infections in the nose, throat, sinuses, and breathing tubes. Sometimes the infection causes nausea, vomiting, or diarrhea. The germ that causes the infection is a virus. No antibiotic or other medicine will kill it. There are medicines that you can take to make you or your child more comfortable.  HOME CARE INSTRUCTIONS   Rest in bed until you start to feel better.  If you have diarrhea or vomiting, eat small amounts of crackers and toast. Soup is helpful.  Do not give aspirin or medicine that contains aspirin to children.  Only take over-the-counter or prescription medicines for pain, discomfort, or fever as directed by your caregiver. SEEK IMMEDIATE MEDICAL CARE IF:   You or your child has not improved within one week.  You or your child has pain that is not at least partially relieved by over-the-counter medicine.  Thick, colored mucus or blood is coughed up.  Discharge from the nose becomes thick yellow or green.  Diarrhea or vomiting gets worse.  There is any major change in your or your child's condition.  You or your child develops a skin rash, stiff neck, severe headache, or are unable to hold down food or fluid.  You or your child has an oral temperature above 102 F (38.9 C), not controlled by medicine.  Your baby is older than 3 months with a rectal temperature of 102 F (38.9 C) or higher.  Your baby is 30 months old or younger with a rectal temperature of 100.4 F (38 C) or higher. Document Released: 10/26/2006 Document Revised: 02/02/2012 Document Reviewed: 10/27/2007 [facility] Patient Information 2014 [place], [place].

## 2013-08-15 ENCOUNTER — Other Ambulatory Visit: Payer: Self-pay | Admitting: Nurse Practitioner

## 2013-08-15 ENCOUNTER — Telehealth: Payer: Self-pay | Admitting: *Deleted

## 2013-08-15 MED ORDER — SIMVASTATIN 40 MG PO TABS: ORAL_TABLET | ORAL | Status: DC

## 2013-08-15 MED ORDER — AZITHROMYCIN 250 MG PO TABS: ORAL_TABLET | ORAL | Status: DC

## 2013-08-15 NOTE — Telephone Encounter (Signed)
Patient was seen Thursday by Shanda Bumps and still no better, cough is still deep and has to cough several times to clear throat and is wheezing some. Was told by Shanda Bumps to call back today. Can something be called in.  Pharmacy: [facility]

## 2013-08-15 NOTE — Progress Notes (Signed)
Pt still having symptoms would like z pack; will fax this into pharmacy

## 2013-08-15 NOTE — Telephone Encounter (Signed)
Done; z pack sent to pharmacy

## 2013-08-15 NOTE — Telephone Encounter (Signed)
Patient Notified

## 2013-08-16 ENCOUNTER — Telehealth: Payer: Self-pay | Admitting: Internal Medicine

## 2013-08-16 MED ORDER — MOMETASONE FURO-FORMOTEROL FUM 100-5 MCG/ACT IN AERO: 2.0000 | INHALATION_SPRAY | Freq: Two times a day (BID) | RESPIRATORY_TRACT | Status: DC

## 2013-08-16 NOTE — Telephone Encounter (Signed)
Spoke with patients spouse, states insurance is "making" them [facility]ies [facility] and "mnaking" them get 90 day supply on medication. Requesting 90 day [facility] of [facility] has been sent spouse is aware and nothing further needed at this time

## 2013-08-19 ENCOUNTER — Other Ambulatory Visit: Payer: Self-pay | Admitting: *Deleted

## 2013-08-19 MED ORDER — ESCITALOPRAM OXALATE 10 MG PO TABS: 10.0000 mg | ORAL_TABLET | Freq: Every day | ORAL | Status: DC

## 2013-08-22 ENCOUNTER — Emergency Department (HOSPITAL_COMMUNITY)
Admission: EM | Admit: 2013-08-22 | Discharge: 2013-08-22 | Disposition: A | Discharge status: Home / Self Care | Payer: Managed Care, Other (non HMO) | Attending: Emergency Medicine | Admitting: Emergency Medicine

## 2013-08-22 ENCOUNTER — Emergency Department (HOSPITAL_COMMUNITY): Payer: Managed Care, Other (non HMO)

## 2013-08-22 ENCOUNTER — Encounter (HOSPITAL_COMMUNITY): Payer: Self-pay | Admitting: *Deleted

## 2013-08-22 DIAGNOSIS — Z7982 Long term (current) use of aspirin: Secondary | ICD-10-CM | POA: Insufficient documentation

## 2013-08-22 DIAGNOSIS — IMO0002 Reserved for concepts with insufficient information to code with codable children: Secondary | ICD-10-CM | POA: Insufficient documentation

## 2013-08-22 DIAGNOSIS — R05 Cough: Secondary | ICD-10-CM | POA: Insufficient documentation

## 2013-08-22 DIAGNOSIS — E785 Hyperlipidemia, unspecified: Secondary | ICD-10-CM | POA: Insufficient documentation

## 2013-08-22 DIAGNOSIS — Z872 Personal history of diseases of the skin and subcutaneous tissue: Secondary | ICD-10-CM | POA: Insufficient documentation

## 2013-08-22 DIAGNOSIS — Z8701 Personal history of pneumonia (recurrent): Secondary | ICD-10-CM | POA: Insufficient documentation

## 2013-08-22 DIAGNOSIS — R079 Chest pain, unspecified: Secondary | ICD-10-CM

## 2013-08-22 DIAGNOSIS — G4700 Insomnia, unspecified: Secondary | ICD-10-CM | POA: Insufficient documentation

## 2013-08-22 DIAGNOSIS — K219 Gastro-esophageal reflux disease without esophagitis: Secondary | ICD-10-CM | POA: Insufficient documentation

## 2013-08-22 DIAGNOSIS — Z87891 Personal history of nicotine dependence: Secondary | ICD-10-CM | POA: Insufficient documentation

## 2013-08-22 DIAGNOSIS — R059 Cough, unspecified: Secondary | ICD-10-CM | POA: Insufficient documentation

## 2013-08-22 DIAGNOSIS — Z79899 Other long term (current) drug therapy: Secondary | ICD-10-CM | POA: Insufficient documentation

## 2013-08-22 DIAGNOSIS — J3489 Other specified disorders of nose and nasal sinuses: Secondary | ICD-10-CM | POA: Insufficient documentation

## 2013-08-22 DIAGNOSIS — I10 Essential (primary) hypertension: Secondary | ICD-10-CM | POA: Insufficient documentation

## 2013-08-22 DIAGNOSIS — M109 Gout, unspecified: Secondary | ICD-10-CM | POA: Insufficient documentation

## 2013-08-22 DIAGNOSIS — J449 Chronic obstructive pulmonary disease, unspecified: Secondary | ICD-10-CM | POA: Insufficient documentation

## 2013-08-22 DIAGNOSIS — J4489 Other specified chronic obstructive pulmonary disease: Secondary | ICD-10-CM | POA: Insufficient documentation

## 2013-08-22 DIAGNOSIS — R51 Headache: Secondary | ICD-10-CM | POA: Insufficient documentation

## 2013-08-22 DIAGNOSIS — Z9089 Acquired absence of other organs: Secondary | ICD-10-CM | POA: Insufficient documentation

## 2013-08-22 DIAGNOSIS — Z87448 Personal history of other diseases of urinary system: Secondary | ICD-10-CM | POA: Insufficient documentation

## 2013-08-22 LAB — BASIC METABOLIC PANEL
BUN: 14 mg/dL (ref 6–23)
Calcium: 9.5 mg/dL (ref 8.4–10.5)
Chloride: 104 mEq/L (ref 96–112)
GFR calc Af Amer: >90 mL/min (ref >90)
GFR calc non Af Amer: >90 mL/min (ref >90)
Glucose, Bld: 158 mg/dL — ABNORMAL HIGH (ref 70–99)
Potassium: 3.7 mEq/L (ref 3.5–5.1)
Sodium: 139 mEq/L (ref 135–145)

## 2013-08-22 LAB — CBC
HCT: 40.4 % (ref 39.0–52.0)
Hemoglobin: 13.7 g/dL (ref 13.0–17.0)
MCH: 30.7 pg (ref 26.0–34.0)
MCHC: 33.9 g/dL (ref 30.0–36.0)
MCV: 90.6 fL (ref 78.0–100.0)
Platelets: 227 10*3/uL (ref 150–400)
RDW: 12.6 % (ref 11.5–15.5)
WBC: 6 10*3/uL (ref 4.0–10.5)

## 2013-08-22 IMAGING — CR DG CHEST 1V PORT
1 series · 1 of 1 positions shown · non-contrast
Comparison: [DATE]

CLINICAL DATA: Chest pain

EXAM:
PORTABLE CHEST - 1 VIEW

[portable]
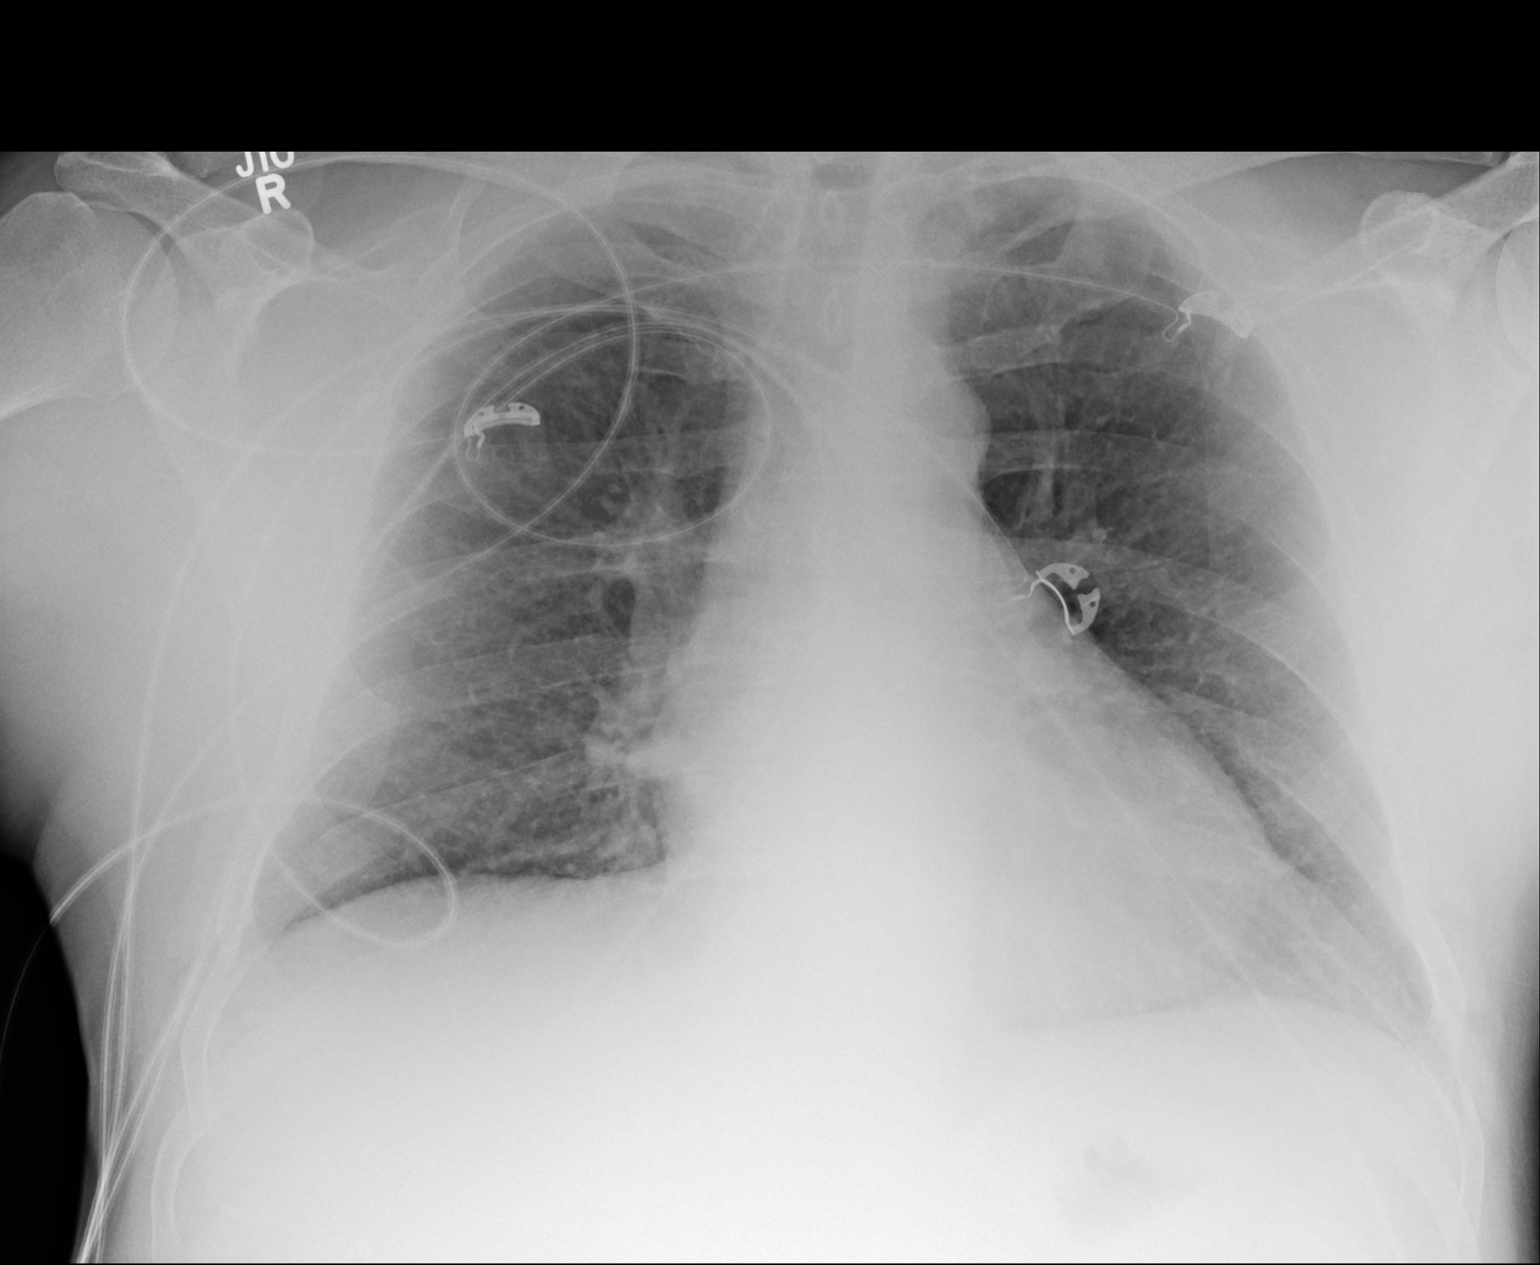

[1 of 1 positions shown; findings below may reference images not displayed]

FINDINGS: The heart size and mediastinal contours are within normal limits.
Both lungs are clear. The visualized skeletal structures are
unremarkable.
IMPRESSION: No active disease.

## 2013-08-22 MED ORDER — TRAMADOL HCL 50 MG PO TABS: 50.0000 mg | ORAL_TABLET | Freq: Four times a day (QID) | ORAL | Status: DC | PRN

## 2013-08-22 MED ORDER — ASPIRIN 81 MG PO CHEW
324.0000 mg | CHEWABLE_TABLET | Freq: Once | ORAL | Status: AC
  Administered 2013-08-22: 324 mg via ORAL
  Filled 2013-08-22: qty 4

## 2013-08-22 NOTE — ED Notes (Signed)
Right sided upper chest pain starting last night.  Treated last week with z-pack for cough with no relief.  Denies sob/n/v/weakness.

## 2013-08-22 NOTE — ED Provider Notes (Signed)
CSN: [id number]     Arrival date & time 08/22/13  0704 History   This chart was scribed for Shelda Jakes, MD by Bennett Scrape, ED Scribe. This patient was seen in room APA19/APA19 and the patient's care was started at 7:25 AM.    Chief Complaint  Patient presents with  . Chest Pain    Patient is a 57 y.o. male presenting with chest pain. The history is provided by the patient. No language interpreter was used.  Chest Pain Pain location:  R chest Pain quality: sharp   Pain radiates to:  Does not radiate Pain radiates to the back: no   Duration:  11 hours Timing:  Intermittent Progression:  Unchanged Chronicity:  New Associated symptoms: cough and headache (yesterday)   Associated symptoms: no abdominal pain, no back pain, no fever, no nausea, no shortness of breath and not vomiting   Risk factors: high cholesterol and hypertension     HPI Comments: Aaron [name] is a 57 y.o. male who presents to the Emergency Department complaining of intermittent CP episodes that started around 8p last night. The pain is located in the right upper chest, described as sharp and "grabbing" and is non-radiating. The episodes last 2 seconds at a time and occur every 2 minutes. He reports that the longest episode was with the original onset which lasted for 15 minutes, but he denies any further episodes lasting more than 15 minutes. He denies SOB, nausea and emesis as associated symptoms. He denies taking ASA today. Pt denies having prior episodes of similar symptoms. He denies having a h/o cardiac conditions. He has a h/o AAA diagnosed through CT scan during a ED visit for back pain but states that this was a secondary finding and not the source of his pain at the time. He reports that he was followed up by Cardiology for the AAA and has an echocardiogram and nuclear stress test that was normal in October 2013. He then had a stent graft placed without any further complication. He denies having any known  prior cardiac catheterizations.   He also c/o a secondary complaint of a cough with associated congestion for the past week. He states that he just finished a Z-pack 3 days ago prescribed by his PCP with no improvement. He denies fevers, abdominal pain, nausea and emesis.   [facility] is PCP  Past Medical History  Diagnosis Date  . Hyperlipidemia   . Gout   . Rash 2011    left chest, biopsy 2012 Dr. Charlton Haws, results pending  . Dyspnea     normal PFT April 2012  . Tobacco abuse   . Pneumonia     hx of and hx of bronchitis  . GERD (gastroesophageal reflux disease)   . H/O hiatal hernia   . Arthritis     lower back  . Asthma     followed by Dr. Samara Snide  . HTN (hypertension)     hx of sees Dr. Alonna Minium  . Abdominal aortic aneurysm     sees Dr. Jacinto Halim for cardiac f/u, [phone]  . Other abnormal blood chemistry   . Insomnia, unspecified   . External hemorrhoids without mention of complication   . Other testicular dysfunction   . Other specified erythematous condition(695.89)   . Other dyspnea and respiratory abnormality   . Prostatitis, unspecified   . Other malaise and fatigue   . Chronic airway obstruction, not elsewhere classified    Past Surgical History  Procedure Laterality Date  .  Appendectomy    . Tonsillectomy    . Wrist surgery      left  . Toe surgery  01/2012    joint of left great toe  . Tympanoplasty    . Abdominal aortic endovascular stent graft  09/22/2012    Procedure: ABDOMINAL AORTIC ENDOVASCULAR STENT GRAFT;  Surgeon: Pryor Ochoa, MD;  Location: [facility] OR;  Service: Vascular;  Laterality: N/A;  GORE; ultrasound guided.   Family History  Problem Relation Age of Onset  . Heart attack Father 73  . COPD Father   . Heart disease Father   . Asthma Daughter   . Cancer Mother     Breast   History  Substance Use Topics  . Smoking status: Former Smoker -- 0.20 packs/day for 52.5 years    Types: Cigarettes    Quit date: 09/02/2012   . Smokeless tobacco: Current User    Types: Snuff  . Alcohol Use: No     Comment: past ETOH use quit 11-26-2009    Review of Systems  Constitutional: Negative for fever and chills.  HENT: Positive for congestion. Negative for sore throat and neck pain.   Eyes: Negative for visual disturbance.  Respiratory: Positive for cough. Negative for shortness of breath.   Cardiovascular: Positive for chest pain. Negative for leg swelling.  Gastrointestinal: Negative for nausea, vomiting, abdominal pain and diarrhea.  Genitourinary: Negative for dysuria.  Musculoskeletal: Negative for back pain.  Skin: Negative for rash.  Neurological: Positive for headaches (yesterday).  Hematological: Does not bruise/bleed easily.  Psychiatric/Behavioral: Negative for confusion.    Allergies  Review of patient's allergies indicates no known allergies.  Home Medications   Current Outpatient Rx  Name  Route  Sig  Dispense  Refill  . albuterol (PROVENTIL HFA;VENTOLIN HFA) 108 (90 BASE) MCG/ACT inhaler   Inhalation   Inhale 2 puffs into the lungs every 6 (six) hours as needed. For cough and wheezing.         Marland Kitchen allopurinol (ZYLOPRIM) 100 MG tablet   Oral   Take 1 tablet (100 mg total) by mouth daily.   90 tablet   3   . aspirin 81 MG tablet   Oral   Take 81 mg by mouth daily.         Marland Kitchen azithromycin (ZITHROMAX) 250 MG tablet      Take 2 tablets today then once daily for 4 days additional days   6 tablet   0   . escitalopram (LEXAPRO) 10 MG tablet   Oral   Take 1 tablet (10 mg total) by mouth daily.   30 tablet   3   . losartan (COZAAR) 50 MG tablet   Oral   Take 1 tablet (50 mg total) by mouth daily.   30 tablet   6   . mometasone (NASONEX) 50 MCG/ACT nasal spray   Nasal   Place 2 sprays into the nose daily.         . mometasone-formoterol (DULERA) 100-5 MCG/ACT AERO   Inhalation   Inhale 2 puffs into the lungs 2 (two) times daily.   3 Inhaler   3     Hold for pt to call    . pantoprazole (PROTONIX) 40 MG tablet   Oral   Take 1 tablet (40 mg total) by mouth every morning.   30 tablet   3   . simvastatin (ZOCOR) 40 MG tablet      Take one tablet by mouth daily at bedtime for  cholesterol   30 tablet   5   . traMADol (ULTRAM) 50 MG tablet   Oral   Take 1 tablet (50 mg total) by mouth every 6 (six) hours as needed.   20 tablet   0    Triage Vitals: BP 134/82  Temp(Src) 97.9 F (36.6 C) (Oral)  Resp 16  Ht 6\' 2"  (1.88 m)  Wt 230 lb (104.327 kg)  BMI 29.52 kg/m2  SpO2 98%  Physical Exam  Nursing note and vitals reviewed. Constitutional: He is oriented to person, place, and time. He appears well-developed and well-nourished. No distress.  HENT:  Head: Normocephalic and atraumatic.  Mouth/Throat: Oropharynx is clear and moist.  Eyes: Conjunctivae and EOM are normal. Pupils are equal, round, and reactive to light.  Sclera are clear  Neck: Neck supple. No tracheal deviation present.  Cardiovascular: Normal rate and regular rhythm.   No murmur heard. Pulses:      Dorsalis pedis pulses are 2+ on the right side, and 2+ on the left side.  Pulmonary/Chest: Effort normal and breath sounds normal. No respiratory distress. He has no wheezes.  Abdominal: Soft. Bowel sounds are normal. He exhibits no distension. There is no tenderness.  Musculoskeletal: Normal range of motion. He exhibits no edema (no ankle swelling).  Lymphadenopathy:    He has no cervical adenopathy.  Neurological: He is alert and oriented to person, place, and time. No cranial nerve deficit.  Pt able to move both sets of fingers and toes  Skin: Skin is warm and dry. No rash noted.  Psychiatric: He has a normal mood and affect. His behavior is normal.    ED Course  Procedures (including critical care time)  Medications  aspirin chewable tablet 324 mg (324 mg Oral Given 08/22/13 0752)    DIAGNOSTIC STUDIES: Oxygen Saturation is 98% on room air, normal by my interpretation.     COORDINATION OF CARE: 7:33 AM-Informed pt that his EKG is normal. Advised pt that I do not believe that his symptoms are cardiac related based on history and PE. Discussed treatment plan which includes ASA, CXR, CBC panel, BMP and troponin with pt at bedside and pt agreed to plan.   Labs Review Labs Reviewed  BASIC METABOLIC PANEL - Abnormal; Notable for the following:    Glucose, Bld 158 (*)    All other components within normal limits  CBC  TROPONIN I   Results for orders placed during the hospital encounter of 08/22/13  CBC      Result Value Range   WBC 6.0  4.0 - 10.5 K/uL   RBC 4.46  4.22 - 5.81 MIL/uL   Hemoglobin 13.7  13.0 - 17.0 g/dL   HCT 45.4  09.8 - 11.9 %   MCV 90.6  78.0 - 100.0 fL   MCH 30.7  26.0 - 34.0 pg   MCHC 33.9  30.0 - 36.0 g/dL   RDW 14.7  82.9 - 56.2 %   Platelets 227  150 - 400 K/uL  BASIC METABOLIC PANEL      Result Value Range   Sodium 139  135 - 145 mEq/L   Potassium 3.7  3.5 - 5.1 mEq/L   Chloride 104  96 - 112 mEq/L   CO2 25  19 - 32 mEq/L   Glucose, Bld 158 (*) 70 - 99 mg/dL   BUN 14  6 - 23 mg/dL   Creatinine, Ser 1.30  0.50 - 1.35 mg/dL   Calcium 9.5  8.4 - 86.5  mg/dL   GFR calc non Af Amer >90  >90 mL/min   GFR calc Af Amer >90  >90 mL/min  TROPONIN I      Result Value Range   Troponin I <0.30  <0.30 ng/mL     Date: 08/22/2013  Rate: 57  Rhythm: sinus bradycardia  QRS Axis: normal  Intervals: normal  ST/T Wave abnormalities: normal  Conduction Disutrbances:none  Narrative Interpretation:   Old EKG Reviewed: unchanged No change in EKG compared to Feb 24th 2012  Imaging Review Dg Chest Port 1 View  08/22/2013   CLINICAL DATA:  Chest pain  EXAM: PORTABLE CHEST - 1 VIEW  COMPARISON:  04/26/2013  FINDINGS: The heart size and mediastinal contours are within normal limits. Both lungs are clear. The visualized skeletal structures are unremarkable.  IMPRESSION: No active disease.   Electronically Signed   By: Signa Kell M.D.   On:  08/22/2013 08:10    MDM   1. Chest pain    Patient with 12 hours of chest pain, negative Tn, no acute EKG changes, Negative CXR for pneumonia, pulmonary edema or pneumothorax. Chest pain not c/w acute cardiac event.   I personally performed the services described in this documentation, which was scribed in my presence. The recorded information has been reviewed and is accurate.     Shelda Jakes, MD 08/22/13 [phone]

## 2013-08-24 ENCOUNTER — Telehealth: Payer: Self-pay | Admitting: *Deleted

## 2013-08-24 MED ORDER — ESCITALOPRAM OXALATE 10 MG PO TABS: 10.0000 mg | ORAL_TABLET | Freq: Every day | ORAL | Status: DC

## 2013-08-24 NOTE — Telephone Encounter (Signed)
Patient called and stated that he still has the URI and cough for 3 weeks. Finished a Z-Pack last Friday. Sunday patient woke up with chest pains sown the middle to the right side of the chest. Wife made patient go to [facility]. They stated that his x-ray's came back fine, no Pneumonia and EKG negative and bloodwork was fine. They told him it was not a cardio problem. Patient thinks it [place] Brines be Acid Reflux and thinks his reflux medication needs to be changed to something different, he is currently taking Protonix. He is thinking that is what is causing his deep cough  Also wants [facility]ro #[facility] [place]. Faxed to pharmacy.

## 2013-08-24 NOTE — Telephone Encounter (Signed)
Zpack last 5 days in the system after last dose; also cough can last up to 6 weeks are more; will not change protonix at this time but make sure he is taking it regularly

## 2013-08-24 NOTE — Telephone Encounter (Signed)
Patient Notified

## 2013-09-29 ENCOUNTER — Ambulatory Visit: Payer: 59

## 2013-09-29 DIAGNOSIS — Z23 Encounter for immunization: Secondary | ICD-10-CM

## 2013-10-17 ENCOUNTER — Other Ambulatory Visit: Payer: Self-pay | Admitting: *Deleted

## 2013-10-17 MED ORDER — PANTOPRAZOLE SODIUM 40 MG PO TBEC: 40.0000 mg | DELAYED_RELEASE_TABLET | Freq: Every morning | ORAL | Status: DC

## 2013-10-18 ENCOUNTER — Other Ambulatory Visit: Payer: Self-pay

## 2013-10-18 MED ORDER — PANTOPRAZOLE SODIUM 40 MG PO TBEC: 40.0000 mg | DELAYED_RELEASE_TABLET | Freq: Every morning | ORAL | Status: DC

## 2013-10-18 NOTE — Telephone Encounter (Signed)
Left [facility] on [facility] to [facility] yesterday. Spoke with patient, patient prefers to use [facility]. RX was sent to [facility] pharmacy

## 2013-10-31 ENCOUNTER — Encounter: Payer: Self-pay | Admitting: Vascular Surgery

## 2013-11-01 ENCOUNTER — Ambulatory Visit
Admission: RE | Admit: 2013-11-01 | Discharge: 2013-11-01 | Disposition: A | Discharge status: Home / Self Care | Payer: Managed Care, Other (non HMO) | Source: Ambulatory Visit | Attending: Vascular Surgery | Admitting: Vascular Surgery

## 2013-11-01 ENCOUNTER — Encounter: Payer: Self-pay | Admitting: Vascular Surgery

## 2013-11-01 ENCOUNTER — Encounter (INDEPENDENT_AMBULATORY_CARE_PROVIDER_SITE_OTHER): Payer: Self-pay

## 2013-11-01 ENCOUNTER — Ambulatory Visit (INDEPENDENT_AMBULATORY_CARE_PROVIDER_SITE_OTHER): Payer: Managed Care, Other (non HMO) | Admitting: Vascular Surgery

## 2013-11-01 VITALS — BP 130/76 | HR 59 | Ht 74.0 in | Wt 242.0 lb

## 2013-11-01 DIAGNOSIS — I714 Abdominal aortic aneurysm, without rupture, unspecified: Secondary | ICD-10-CM

## 2013-11-01 DIAGNOSIS — Z48812 Encounter for surgical aftercare following surgery on the circulatory system: Secondary | ICD-10-CM

## 2013-11-01 HISTORY — DX: Abdominal aortic aneurysm, without rupture, unspecified: I71.40

## 2013-11-01 IMAGING — CT CT CTA ABD/PEL W/CM AND/OR W/O CM
3 of 10 series · 10 of 36 positions shown, 15 images · IV contrast (omnipaque)
Comparison: [DATE] and chest CT [DATE]

CLINICAL DATA: Evaluate abdominal aortic stent graft.

EXAM:
CT ANGIOGRAPHY ABDOMEN AND PELVIS
TECHNIQUE: Multidetector CT imaging of the abdomen and pelvis was performed
using the standard protocol during bolus administration of
intravenous contrast. Multiplanar reconstructed images including
MIPs were obtained and reviewed to evaluate the vascular anatomy.
CONTRAST:  80 ml Omnipaque 350

[Series 5: angio 2.5 · axial · 0.89mm/px · z∈[-390,-68]mm · 4 of 216 slices shown, 9 images]
[im 44/216  soft-tissue]
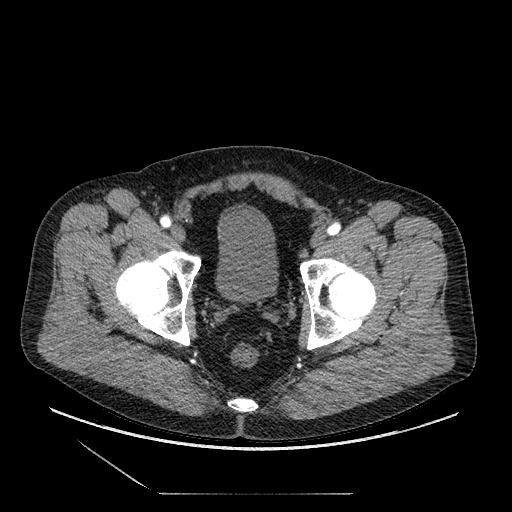
[im 44/216  lung]
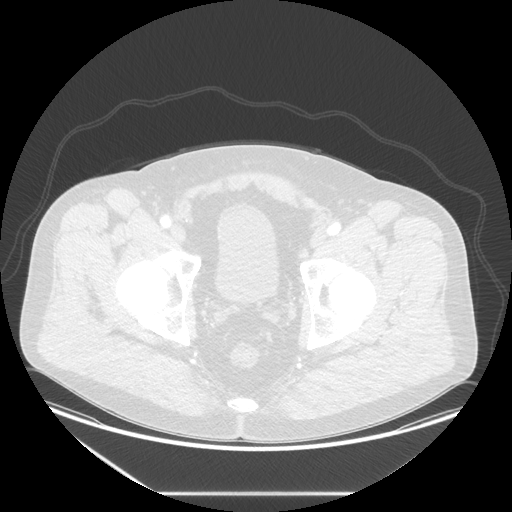
[im 44/216  bone]
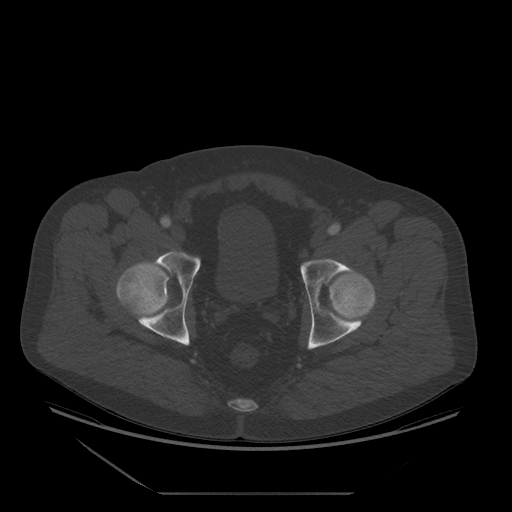
[im 87/216  soft-tissue]
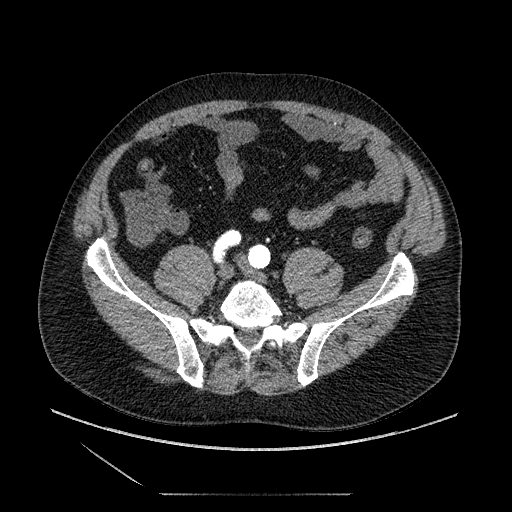
[im 87/216  lung]
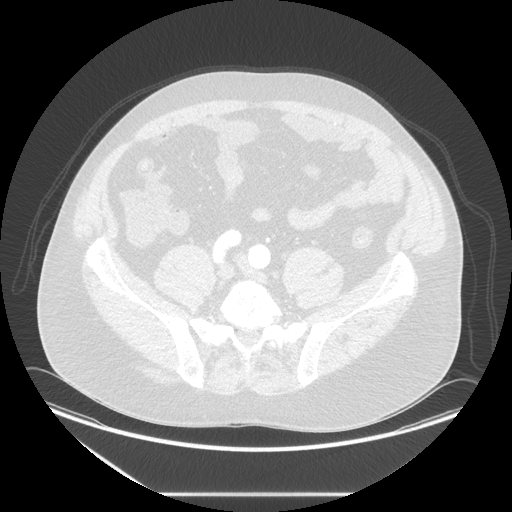
[im 130/216  soft-tissue]
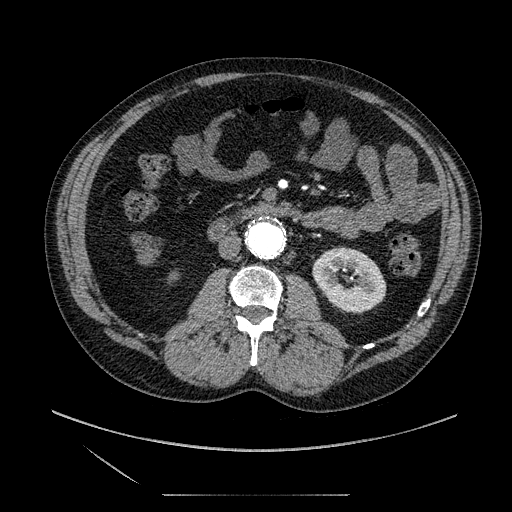
[im 130/216  lung]
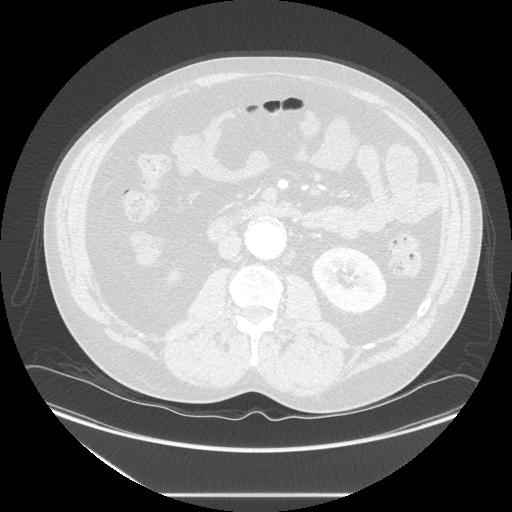
[im 173/216  soft-tissue]
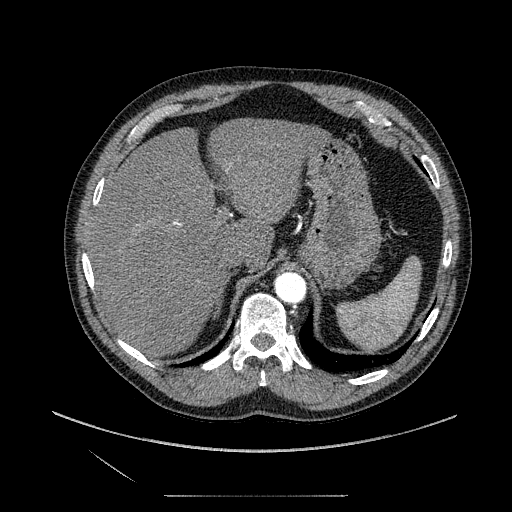
[im 173/216  lung]
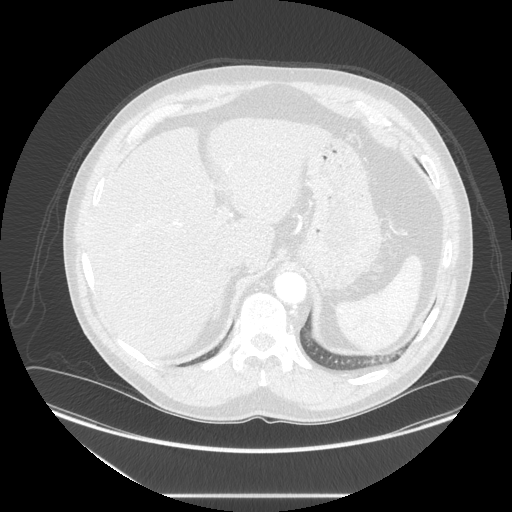

[Series 602: sagittal body · sagittal · 1.05mm/px · 3 of 170 slices shown (1 of 2)]
[im 43/170  soft-tissue]
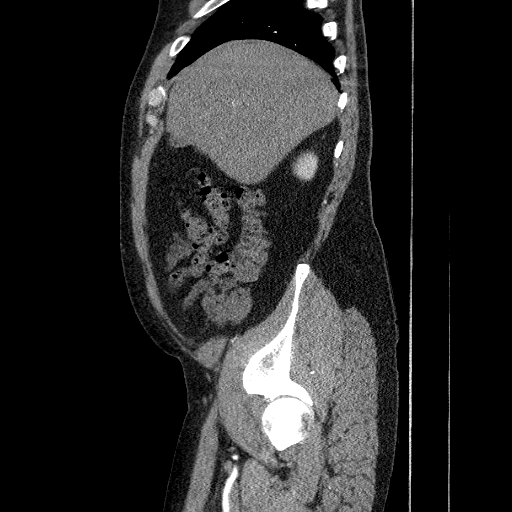
[im 85/170  soft-tissue]
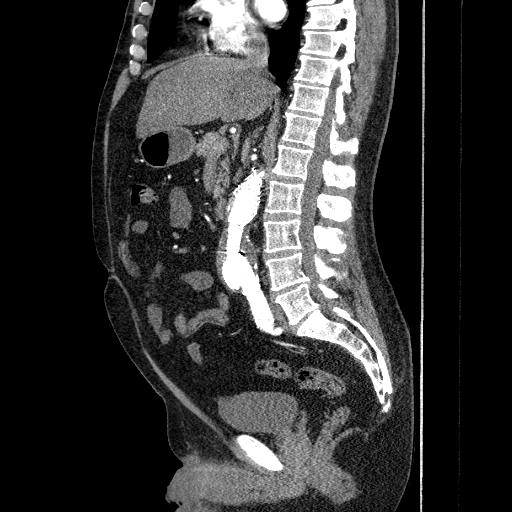
[im 127/170  soft-tissue]
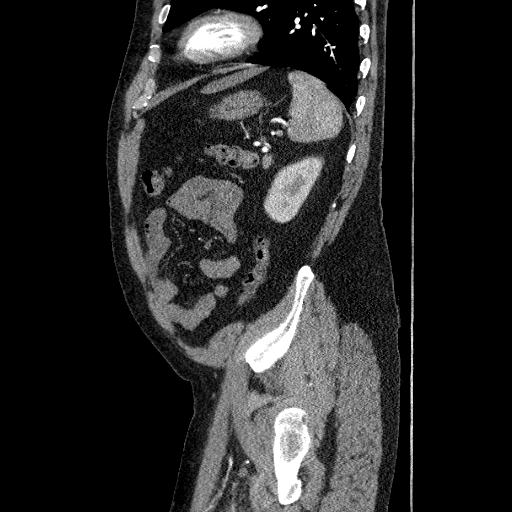

[Series 607: sagittal body · sagittal · 0.89mm/px · 3 of 163 slices shown (2 of 2)]
[im 41/163  soft-tissue]
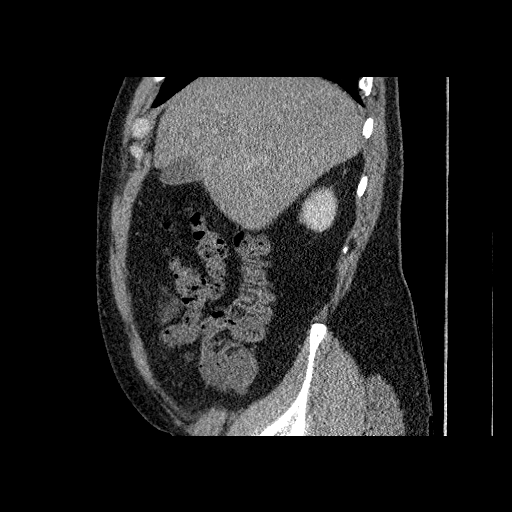
[im 82/163  soft-tissue]
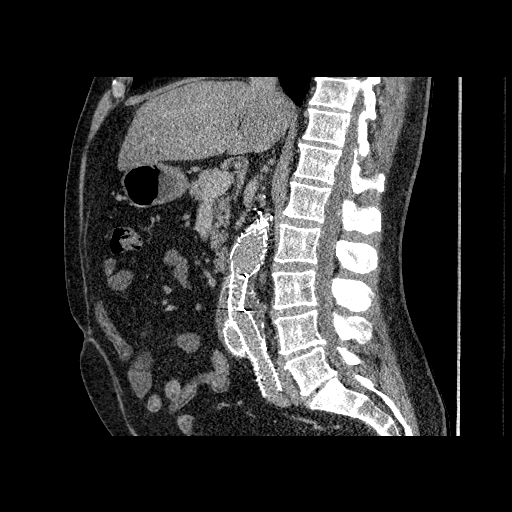
[im 122/163  soft-tissue]
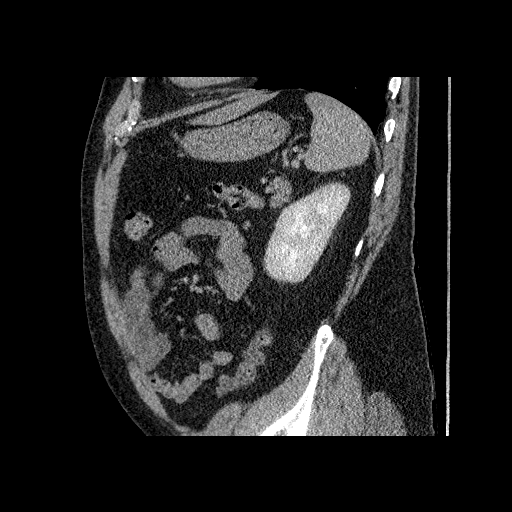

[10 of 36 positions shown; findings below may reference images not displayed]

FINDINGS: ARTERIAL FINDINGS:

Aorta: The aortic stent graft is stable in position and located just
below the renal arteries. Stent limbs extend into the common iliac
arteries bilaterally. The stent graft is patent. The aneurysm sac
now measures 4.3 x 3.7 cm and previously measured 4.3 x 3.9 cm.
Slightly prominent the lumbar arteries along the right posterior
aspect of the aorta on sequence 5, image 115 but no evidence for an
endoleak.

Celiac axis:         Patent and main branches are patent.

Superior mesenteric: Patent

Left renal:          Patent

Right renal:         Patent

Inferior mesenteric: The origin is occluded. There is distal
reconstitution of the IMA.

Left iliac: The graft is patent. The left internal and external
iliac arteries are patent without aneurysm. Again noted is some
irregular plaque involving an internal iliac branch on sequence 5,
image 146. The proximal external iliac artery is torturous near its
origin. Proximal left femoral arteries are patent.

Right iliac: Graft is patent. The right internal and external iliac
arteries are patent. Proximal right femoral arteries are patent.

Venous findings: Portal venous veins and IVC appear to be patent.

Review of the MIP images confirms the above findings.

NONVASCULAR FINDINGS:

Again noted is a 5 mm nodular density near the right minor fissure,
sequence 9, image 1. Stable 4 mm nodule in the right middle lobe on
sequence 9, image 6. Stable 4 mm pleural-based nodule in the right
lower lobe on image 9. Stable peripheral nodule in the left lower
lobe on image 13 roughly measuring 5 mm. No acute abnormality to the
liver, gallbladder, spleen, kidneys, adrenal glands and pancreas.
Again noted is mild fullness in the left adrenal gland which is
stable. Stable exophytic low-density structure in the left kidney
mid pole, measures 1.0, and likely represents a cyst. No significant
abdominal or pelvic lymphadenopathy. No evidence for free fluid.
Normal appearance of the prostate and urinary bladder. No gross
abnormality to the small or large bowel. No acute bone abnormality.
IMPRESSION: The aortic stent graft is patent. The abdominal aortic aneurysm sac
has slightly decreased in size, measuring 4.3 x 3.7 cm. No evidence
for an endoleak.

No acute abnormalities within the abdomen or pelvis.

Stable small nodules at the lung bases.

## 2013-11-01 MED ORDER — IOHEXOL 350 MG/ML SOLN
80.0000 mL | Freq: Once | INTRAVENOUS | Status: AC | PRN
  Administered 2013-11-01: 80 mL via INTRAVENOUS

## 2013-11-01 NOTE — Progress Notes (Signed)
Subjective:     Patient ID: Aaron [name], male   DOB: December 03, 1955, 57 y.o.   MRN: [id number]  HPI this 57 year old male returns for followup regarding his aortic stent graft which was inserted in October of 2013. At that time he had a 5.1 cm infrarenal abdominal aortic aneurysm. He has done well since then with no abnormal or back pain. He denies claudication symptoms. He quit smoking at the time of his surgery. He currently takes aspirin and a statin.  Past Medical History  Diagnosis Date  . Hyperlipidemia   . Gout   . Rash 2011    left chest, biopsy 2012 Dr. Charlton Haws, results pending  . Dyspnea     normal PFT April 2012  . Tobacco abuse   . Pneumonia     hx of and hx of bronchitis  . GERD (gastroesophageal reflux disease)   . H/O hiatal hernia   . Arthritis     lower back  . Asthma     followed by Dr. Samara Snide  . HTN (hypertension)     hx of sees Dr. Alonna Minium  . Abdominal aortic aneurysm     sees Dr. Jacinto Halim for cardiac f/u, [phone]  . Other abnormal blood chemistry   . Insomnia, unspecified   . External hemorrhoids without mention of complication   . Other testicular dysfunction   . Other specified erythematous condition(695.89)   . Other dyspnea and respiratory abnormality   . Prostatitis, unspecified   . Other malaise and fatigue   . Chronic airway obstruction, not elsewhere classified     History  Substance Use Topics  . Smoking status: Former Smoker -- 0.20 packs/day for 52.5 years    Types: Cigarettes    Quit date: 09/02/2012  . Smokeless tobacco: Current User    Types: Snuff  . Alcohol Use: No     Comment: past ETOH use quit 11-26-2009    Family History  Problem Relation Age of Onset  . Heart attack Father 55  . COPD Father   . Heart disease Father   . Asthma Daughter   . Cancer Mother     Breast    No Known Allergies  Current outpatient prescriptions:albuterol (PROVENTIL HFA;VENTOLIN HFA) 108 (90 BASE) MCG/ACT inhaler, Inhale 2 puffs into  the lungs every 6 (six) hours as needed. For cough and wheezing., Disp: , Rfl: ;  allopurinol (ZYLOPRIM) 100 MG tablet, Take 1 tablet (100 mg total) by mouth daily., Disp: 90 tablet, Rfl: 3;  aspirin 81 MG tablet, Take 81 mg by mouth daily., Disp: , Rfl:  escitalopram (LEXAPRO) 10 MG tablet, Take 1 tablet (10 mg total) by mouth daily., Disp: 90 tablet, Rfl: 3;  losartan (COZAAR) 50 MG tablet, Take 1 tablet (50 mg total) by mouth daily., Disp: 30 tablet, Rfl: 6;  mometasone (NASONEX) 50 MCG/ACT nasal spray, Place 2 sprays into the nose daily., Disp: , Rfl: ;  mometasone-formoterol (DULERA) 100-5 MCG/ACT AERO, Inhale 2 puffs into the lungs 2 (two) times daily., Disp: 3 Inhaler, Rfl: 3 pantoprazole (PROTONIX) 40 MG tablet, Take 1 tablet (40 mg total) by mouth every morning., Disp: 30 tablet, Rfl: 3;  simvastatin (ZOCOR) 40 MG tablet, Take one tablet by mouth daily at bedtime for cholesterol, Disp: 30 tablet, Rfl: 5;  azithromycin (ZITHROMAX) 250 MG tablet, Take 2 tablets today then once daily for 4 days additional days, Disp: 6 tablet, Rfl: 0 traMADol (ULTRAM) 50 MG tablet, Take 1 tablet (50 mg total) by mouth every 6 (six) hours  as needed., Disp: 20 tablet, Rfl: 0  BP 130/76  Pulse 59  Ht 6\' 2"  (1.88 m)  Wt 242 lb (109.77 kg)  BMI 31.06 kg/m2  SpO2 97%  Body mass index is 31.06 kg/(m^2).           Review of Systems denies chest pain but does have occasional mild dyspnea on exertion. Has a productive cough on occasion. All other systems negative and complete review of systems    Objective:   Physical Exam BP 130/76  Pulse 59  Ht 6\' 2"  (1.88 m)  Wt 242 lb (109.77 kg)  BMI 31.06 kg/m2  SpO2 97%  Gen.-alert and oriented x3 in no apparent distress HEENT normal for age Lungs no rhonchi or wheezing Cardiovascular regular rhythm no murmurs carotid pulses 3+ palpable no bruits audible Abdomen soft nontender no palpable masses Musculoskeletal free of  major deformities Skin clear -no  rashes Neurologic normal Lower extremities 3+ femoral and dorsalis pedis pulses palpable bilaterally with no edema  Today I ordered a CT angiogram of the abdomen and pelvis which are reviewed by computer. Stent graft is in excellent position. Maximum diameter continues to be 43 mm down from 51 mm preoperatively. There is no evidence of significant endoleak.        Assessment:     Doing well 13 months post endovascular percutaneous stent graft insertion-Gore -C3-Excluder    Plan:     Return in one year for duplex scan of the aneurysm in our office  VASCULAR QUALITY INITIATIVE FOLLOW UP DATA:  Current smoker: [  ] yes  [x  ] no  Living status: [ x ]  Home  [  ] Nursing home  [  ] Homeless    MEDS:  ASA [ x ] yes  [  ] no- [  ] medical reason  [  ] non compliant  STATIN  [x  ] yes  [  ] no- [  ] medical reason  [  ] non compliant  Beta blocker [  ] yes  [ x ] no- [  ] medical reason  [  ] non compliant  ACE inhibitor [  ] yes  [x  ] no- [  ] medical reason  [  ] non compliant  P2Y12 Antagonist [x  ] none  [  ] clopidogrel-Plavix  [  ] ticlopidine-Ticlid   [  ] prasugrel-Effient  [  ] ticagrelor- Brilinta    Anticoagulant [x  ] None  [  ] warfarin  [  ] rivaroxaban-Xarelto [  ] dabigatran- Pradaxa  Current Max AAA = 43 mm  Endoleak:  [ x ] no  [  ] yes- [  ] type I  [  ] type II  [  ] type III  [  ] indeterminate  Number of new interventions: 0 -   Date:      Why:  [  ] endoleak  [  ] limb occlusion   [  ] growth  [  ]  Migration   [  ] symtomatic/ rupture    Conversion to open repair: [  ] yes   [ x ] no   Date:   Other operation related to EVAR:  [  ] yes   [x  ] no

## 2013-11-11 ENCOUNTER — Ambulatory Visit: Payer: Managed Care, Other (non HMO) | Admitting: Urology

## 2013-12-02 ENCOUNTER — Other Ambulatory Visit: Payer: 59

## 2013-12-02 DIAGNOSIS — R7303 Prediabetes: Secondary | ICD-10-CM

## 2013-12-02 DIAGNOSIS — I719 Aortic aneurysm of unspecified site, without rupture: Secondary | ICD-10-CM

## 2013-12-02 DIAGNOSIS — E785 Hyperlipidemia, unspecified: Secondary | ICD-10-CM

## 2013-12-02 DIAGNOSIS — I10 Essential (primary) hypertension: Secondary | ICD-10-CM

## 2013-12-03 LAB — CBC WITH DIFFERENTIAL/PLATELET
BASOS: 1 %
Basophils Absolute: 0 10*3/uL (ref 0.0–0.2)
EOS: 2 %
Eosinophils Absolute: 0.1 10*3/uL (ref 0.0–0.4)
HCT: 41.5 % (ref 37.5–51.0)
Hemoglobin: 14.2 g/dL (ref 12.6–17.7)
IMMATURE GRANS (ABS): 0 10*3/uL (ref 0.0–0.1)
Immature Granulocytes: 0 %
LYMPHS ABS: 1.6 10*3/uL (ref 0.7–3.1)
Lymphs: 26 %
MCH: 29.7 pg (ref 26.6–33.0)
MCHC: 34.2 g/dL (ref 31.5–35.7)
MCV: 87 fL (ref 79–97)
MONOS ABS: 0.6 10*3/uL (ref 0.1–0.9)
Monocytes: 10 %
Neutrophils Absolute: 3.7 10*3/uL (ref 1.4–7.0)
Neutrophils Relative %: 61 %
RBC: 4.78 x10E6/uL (ref 4.14–5.80)
RDW: 13.1 % (ref 12.3–15.4)
WBC: 6 10*3/uL (ref 3.4–10.8)

## 2013-12-03 LAB — HEMOGLOBIN A1C
Est. average glucose Bld gHb Est-mCnc: 123 mg/dL
Hgb A1c MFr Bld: 5.9 % — ABNORMAL HIGH (ref 4.8–5.6)

## 2013-12-03 LAB — COMPREHENSIVE METABOLIC PANEL
ALT: 25 IU/L (ref 0–44)
AST: 21 IU/L (ref 0–40)
Albumin/Globulin Ratio: 1.7 (ref 1.1–2.5)
Albumin: 4.4 g/dL (ref 3.5–5.5)
Alkaline Phosphatase: 96 IU/L (ref 39–117)
BILIRUBIN TOTAL: 0.6 mg/dL (ref 0.0–1.2)
BUN/Creatinine Ratio: 11 (ref 9–20)
BUN: 11 mg/dL (ref 6–24)
CALCIUM: 9.6 mg/dL (ref 8.7–10.2)
CO2: 25 mmol/L (ref 18–29)
Chloride: 103 mmol/L (ref 97–108)
Creatinine, Ser: 0.99 mg/dL (ref 0.76–1.27)
GFR calc non Af Amer: 85 mL/min/{1.73_m2} (ref >59)
GFR, EST AFRICAN AMERICAN: 99 mL/min/{1.73_m2} (ref >59)
Globulin, Total: 2.6 g/dL (ref 1.5–4.5)
Glucose: 105 mg/dL — ABNORMAL HIGH (ref 65–99)
POTASSIUM: 4.5 mmol/L (ref 3.5–5.2)
SODIUM: 141 mmol/L (ref 134–144)
Total Protein: 7 g/dL (ref 6.0–8.5)

## 2013-12-03 LAB — LIPID PANEL
Chol/HDL Ratio: 3.4 ratio units (ref 0.0–5.0)
Cholesterol, Total: 127 mg/dL (ref 100–199)
HDL: 37 mg/dL — ABNORMAL LOW (ref >39)
LDL Calculated: 74 mg/dL (ref 0–99)
Triglycerides: 79 mg/dL (ref 0–149)
VLDL Cholesterol Cal: 16 mg/dL (ref 5–40)

## 2013-12-06 ENCOUNTER — Encounter: Payer: Self-pay | Admitting: Internal Medicine

## 2013-12-06 ENCOUNTER — Ambulatory Visit (INDEPENDENT_AMBULATORY_CARE_PROVIDER_SITE_OTHER): Payer: 59 | Admitting: Internal Medicine

## 2013-12-06 VITALS — BP 128/72 | HR 88 | Temp 98.7°F | Resp 10 | Ht 73.5 in | Wt 237.0 lb

## 2013-12-06 DIAGNOSIS — Z0000 Encounter for general adult medical examination without abnormal findings: Secondary | ICD-10-CM | POA: Diagnosis not present

## 2013-12-06 DIAGNOSIS — F329 Major depressive disorder, single episode, unspecified: Secondary | ICD-10-CM

## 2013-12-06 DIAGNOSIS — K565 Intestinal adhesions [bands], unspecified as to partial versus complete obstruction: Secondary | ICD-10-CM

## 2013-12-06 DIAGNOSIS — J4540 Moderate persistent asthma, uncomplicated: Secondary | ICD-10-CM

## 2013-12-06 DIAGNOSIS — G8929 Other chronic pain: Secondary | ICD-10-CM

## 2013-12-06 DIAGNOSIS — E785 Hyperlipidemia, unspecified: Secondary | ICD-10-CM | POA: Diagnosis not present

## 2013-12-06 DIAGNOSIS — I10 Essential (primary) hypertension: Secondary | ICD-10-CM | POA: Diagnosis not present

## 2013-12-06 DIAGNOSIS — J45909 Unspecified asthma, uncomplicated: Secondary | ICD-10-CM

## 2013-12-06 DIAGNOSIS — M109 Gout, unspecified: Secondary | ICD-10-CM

## 2013-12-06 DIAGNOSIS — R7309 Other abnormal glucose: Secondary | ICD-10-CM

## 2013-12-06 DIAGNOSIS — Z23 Encounter for immunization: Secondary | ICD-10-CM

## 2013-12-06 DIAGNOSIS — F3289 Other specified depressive episodes: Secondary | ICD-10-CM

## 2013-12-06 DIAGNOSIS — K1321 Leukoplakia of oral mucosa, including tongue: Secondary | ICD-10-CM | POA: Diagnosis not present

## 2013-12-06 DIAGNOSIS — R918 Other nonspecific abnormal finding of lung field: Secondary | ICD-10-CM

## 2013-12-06 DIAGNOSIS — M549 Dorsalgia, unspecified: Secondary | ICD-10-CM

## 2013-12-06 DIAGNOSIS — R7303 Prediabetes: Secondary | ICD-10-CM

## 2013-12-06 DIAGNOSIS — K140 Glossitis: Secondary | ICD-10-CM

## 2013-12-06 DIAGNOSIS — K219 Gastro-esophageal reflux disease without esophagitis: Secondary | ICD-10-CM

## 2013-12-06 DIAGNOSIS — F32A Depression, unspecified: Secondary | ICD-10-CM

## 2013-12-06 MED ORDER — TRAMADOL HCL 50 MG PO TABS: 50.0000 mg | ORAL_TABLET | Freq: Two times a day (BID) | ORAL | Status: DC | PRN

## 2013-12-06 MED ORDER — SIMVASTATIN 20 MG PO TABS: ORAL_TABLET | ORAL | Status: DC

## 2013-12-06 NOTE — Progress Notes (Signed)
Patient ID: Aaron [name], male   DOB: 1956/11/13, 58 y.o.   MRN: [id number]     Chief Complaint  Patient presents with  . Annual Exam    Yearly exam, discuss labs (copy printed) . EKG completed 07/2013    No Known Allergies  HPI 58 y/o male pt is here for his annual visit. He is here with his wife. Reviewed notes from vascular regarding his aneurysm repair.  Reviewed his blood work with the patient No recent gout attack Last colonoscopy in 2010 was normal, next due on 2020 uptodate with his influenza vaccine Due for tetanus vaccine Compliant with his meds He has seen his dentist and is required to have tongue biopsy and ? CT of the jaws. Wife is concerned about cost coverage and would like to see if we can provide referral to an oral surgeon   Review of Systems  Constitutional: Negative for fever, chills, weight loss, malaise/fatigue and diaphoresis.  HENT: Negative for congestion, hearing loss and sore throat.   Eyes: Negative for blurred vision, double vision and discharge.  Respiratory: Negative for cough, sputum production, shortness of breath and wheezing.   Cardiovascular: Negative for chest pain, palpitations, orthopnea and leg swelling.  Gastrointestinal: Negative for heartburn, nausea, vomiting, abdominal pain, diarrhea and constipation.  Genitourinary: Negative for dysuria, urgency, frequency and flank pain.  Musculoskeletal: Negative for back pain, falls, joint pain and myalgias.  Skin: Negative for itching and rash.  Neurological: Negative for dizziness, tingling, focal weakness and headaches.  Psychiatric/Behavioral: Negative for depression and memory loss. The patient is not nervous/anxious.    Past Medical History  Diagnosis Date  . Hyperlipidemia   . Gout   . Rash 2011    left chest, biopsy 2012 Dr. Rozann Lesches, results pending  . Dyspnea     normal PFT April 2012  . Tobacco abuse   . Pneumonia     hx of and hx of bronchitis  . GERD (gastroesophageal reflux  disease)   . H/O hiatal hernia   . Arthritis     lower back  . Asthma     followed by Dr. Berdie Ogren  . HTN (hypertension)     hx of sees Dr. Graylin Shiver  . Abdominal aortic aneurysm     sees Dr. Einar Gip for cardiac f/u, [phone]  . Other abnormal blood chemistry   . Insomnia, unspecified   . External hemorrhoids without mention of complication   . Other testicular dysfunction   . Other specified erythematous condition(695.89)   . Other dyspnea and respiratory abnormality   . Prostatitis, unspecified   . Other malaise and fatigue   . Chronic airway obstruction, not elsewhere classified    Past Surgical History  Procedure Laterality Date  . Appendectomy    . Tonsillectomy    . Wrist surgery      left  . Toe surgery  01/2012    joint of left great toe  . Tympanoplasty    . Abdominal aortic endovascular stent graft  09/22/2012    Procedure: ABDOMINAL AORTIC ENDOVASCULAR STENT GRAFT;  Surgeon: Mal Misty, MD;  Location: [facility];  Service: Vascular;  Laterality: N/A;  GORE; ultrasound guided.   Family History  Problem Relation Age of Onset  . Heart attack Father 21  . COPD Father   . Heart disease Father   . Asthma Daughter   . Cancer Mother     Breast   History   Social History  . Marital Status: Married  Spouse Name: N/A    Number of Children: N/A  . Years of Education: N/A   Occupational History  . CNA [employer]   Social History Main Topics  . Smoking status: Former Smoker -- 0.20 packs/day for 52.5 years    Types: Cigarettes    Quit date: 09/02/2012  . Smokeless tobacco: Current User    Types: Snuff  . Alcohol Use: No     Comment: past ETOH use quit 11-26-2009  . Drug Use: No  . Sexual Activity: Not on file   Other Topics Concern  . Not on file   Social History Narrative  . No narrative on file   Current Outpatient Prescriptions on File Prior to Visit  Medication Sig Dispense Refill  . albuterol (PROVENTIL HFA;VENTOLIN HFA) 108 (90 BASE)  MCG/ACT inhaler Inhale 2 puffs into the lungs every 6 (six) hours as needed. For cough and wheezing.      Marland Kitchen allopurinol (ZYLOPRIM) 100 MG tablet Take 1 tablet (100 mg total) by mouth daily.  90 tablet  3  . aspirin 81 MG tablet Take 81 mg by mouth daily.      Marland Kitchen escitalopram (LEXAPRO) 10 MG tablet Take 1 tablet (10 mg total) by mouth daily.  90 tablet  3  . losartan (COZAAR) 50 MG tablet Take 1 tablet (50 mg total) by mouth daily.  30 tablet  6  . mometasone (NASONEX) 50 MCG/ACT nasal spray Place 2 sprays into the nose daily.      . mometasone-formoterol (DULERA) 100-5 MCG/ACT AERO Inhale 2 puffs into the lungs 2 (two) times daily.  3 Inhaler  3  . pantoprazole (PROTONIX) 40 MG tablet Take 1 tablet (40 mg total) by mouth every morning.  30 tablet  3   No current facility-administered medications on file prior to visit.   Physical exam BP 128/72  Pulse 88  Temp(Src) 98.7 F (37.1 C) (Oral)  Resp 10  Ht 6' 1.5" (1.867 m)  Wt 237 lb (107.502 kg)  BMI 30.84 kg/m2  SpO2 97%  General- adult male in no acute distress Head- atraumatic, normocephalic Eyes- PERRLA, EOMI, no pallor, no icterus Mouth- left lateral margin of the tongue has white plaque on it, not removable to scraping Neck- no lymphadenopathy, no thyromegaly, no jugular vein distension, no carotid bruit Chest- no chest wall deformities, no chest wall tenderness Cardiovascular- normal s1,s2, no murmurs/ rubs/ gallops Respiratory- bilateral clear to auscultation, no wheeze, no rhonchi, no crackles Abdomen- bowel sounds present, soft, non tender Musculoskeletal- able to move all 4 extremities, no spinal and paraspinal tenderness, steady gait, no use of assistive device Neurological- no focal deficit, normal reflexes, normal muscle strength, normal sensation to fine touch and vibration Psychiatry- alert and oriented to person, place and time, normal mood and affect  Labs- CBC    Component Value Date/Time   WBC 6.0 12/02/2013 0815     WBC 6.0 08/22/2013 0740   RBC 4.78 12/02/2013 0815   RBC 4.46 08/22/2013 0740   HGB 14.2 12/02/2013 0815   HCT 41.5 12/02/2013 0815   PLT 227 08/22/2013 0740   MCV 87 12/02/2013 0815   MCH 29.7 12/02/2013 0815   MCH 30.7 08/22/2013 0740   MCHC 34.2 12/02/2013 0815   MCHC 33.9 08/22/2013 0740   RDW 13.1 12/02/2013 0815   RDW 12.6 08/22/2013 0740   LYMPHSABS 1.6 12/02/2013 0815   EOSABS 0.1 12/02/2013 0815   BASOSABS 0.0 12/02/2013 0815    CMP  Component Value Date/Time   NA 141 12/02/2013 0815   NA 139 08/22/2013 0740   K 4.5 12/02/2013 0815   CL 103 12/02/2013 0815   CO2 25 12/02/2013 0815   GLUCOSE 105* 12/02/2013 0815   GLUCOSE 158* 08/22/2013 0740   BUN 11 12/02/2013 0815   BUN 14 08/22/2013 0740   CREATININE 0.99 12/02/2013 0815   CALCIUM 9.6 12/02/2013 0815   PROT 7.0 12/02/2013 0815   AST 21 12/02/2013 0815   ALT 25 12/02/2013 0815   ALKPHOS 96 12/02/2013 0815   BILITOT 0.6 12/02/2013 0815   GFRNONAA 85 12/02/2013 0815   GFRAA 99 12/02/2013 0815   Lipid Panel     Component Value Date/Time   TRIG 79 12/02/2013 0815   HDL 37* 12/02/2013 0815   CHOLHDL 3.4 12/02/2013 0815   LDLCALC 74 12/02/2013 0815   Lab Results  Component Value Date   HGBA1C 5.9* 12/02/2013    Assessment/plan 1. Need for prophylactic vaccination with combined diphtheria-tetanus-pertussis (DTP) vaccine - Tdap vaccine greater than or equal to 7yo IM  2. HYPERTENSION bp remains stable. Continue losartan for now. Reviewed bmp. Low salt intake and exercise reinforced  3. Asthma, moderate persistent Continue dulera and proventil, stable. Follows with pulmonary  4. Prediabetes Reviewed a1c. Dietary recs and exercise to be continued. Monitor weight  5. HYPERLIPIDEMIA Reviewed lipid panel. Improved. Change zocor to 20 mg daily. Dietary list provided to help improve HDL  6. Depression Stable. Continue lexapro  7. Lung nodule, multiple To follow with pulmonary, stable at present  8. Chronic back pain Continue prn tramadol, better controlled,  decrease frequency of prn tramadol  9. Tongue leukoplakia Will need records from his dentist and after reviewing it, will provide referral to oral surgeon. Will need possible tissue biospy to rule out malignancy  10. Gout No recent flare up. Will add uric acid level to the lab work. Continue allopurinol - Uric Acid  11. GERD Continue protonix, stable symptom. Small frequent meals with slow chewing encouraged  12. Physical exam Provide tetanus shot. uptodate with other immunization. Normal prostate on recent ct abdo/ pelvis. uptodate with colonoscopy. Skin care and safe driving counselled. Diet and exercise counselling provided

## 2013-12-06 NOTE — Addendum Note (Signed)
Addended byBlanchie Serve on: 12/06/2013 11:02 AM   Modules accepted: Orders

## 2013-12-20 LAB — SPECIMEN STATUS REPORT

## 2013-12-22 ENCOUNTER — Other Ambulatory Visit (HOSPITAL_COMMUNITY)
Admission: RE | Admit: 2013-12-22 | Discharge: 2013-12-22 | Disposition: A | Discharge status: Home / Self Care | Payer: Managed Care, Other (non HMO) | Source: Ambulatory Visit | Attending: Otolaryngology | Admitting: Otolaryngology

## 2013-12-22 ENCOUNTER — Ambulatory Visit (INDEPENDENT_AMBULATORY_CARE_PROVIDER_SITE_OTHER): Payer: Managed Care, Other (non HMO) | Admitting: Otolaryngology

## 2013-12-22 DIAGNOSIS — K121 Other forms of stomatitis: Secondary | ICD-10-CM | POA: Insufficient documentation

## 2013-12-22 DIAGNOSIS — H903 Sensorineural hearing loss, bilateral: Secondary | ICD-10-CM

## 2013-12-22 DIAGNOSIS — D3705 Neoplasm of uncertain behavior of pharynx: Secondary | ICD-10-CM

## 2013-12-22 DIAGNOSIS — D3709 Neoplasm of uncertain behavior of other specified sites of the oral cavity: Secondary | ICD-10-CM

## 2013-12-22 DIAGNOSIS — D3701 Neoplasm of uncertain behavior of lip: Secondary | ICD-10-CM

## 2013-12-22 DIAGNOSIS — K1230 Oral mucositis (ulcerative), unspecified: Secondary | ICD-10-CM

## 2013-12-22 DIAGNOSIS — K1329 Other disturbances of oral epithelium, including tongue: Secondary | ICD-10-CM | POA: Insufficient documentation

## 2013-12-23 ENCOUNTER — Ambulatory Visit (INDEPENDENT_AMBULATORY_CARE_PROVIDER_SITE_OTHER): Payer: Managed Care, Other (non HMO) | Admitting: Urology

## 2013-12-23 DIAGNOSIS — Z125 Encounter for screening for malignant neoplasm of prostate: Secondary | ICD-10-CM

## 2013-12-23 DIAGNOSIS — N529 Male erectile dysfunction, unspecified: Secondary | ICD-10-CM

## 2013-12-23 DIAGNOSIS — N400 Enlarged prostate without lower urinary tract symptoms: Secondary | ICD-10-CM

## 2013-12-26 ENCOUNTER — Other Ambulatory Visit: Payer: Self-pay | Admitting: *Deleted

## 2013-12-26 MED ORDER — ESCITALOPRAM OXALATE 10 MG PO TABS: 10.0000 mg | ORAL_TABLET | Freq: Every day | ORAL | Status: DC

## 2013-12-28 LAB — URIC ACID: URIC ACID: 6.5 mg/dL (ref 3.7–8.6)

## 2013-12-28 LAB — SPECIMEN STATUS REPORT

## 2014-01-16 ENCOUNTER — Other Ambulatory Visit: Payer: Self-pay | Admitting: *Deleted

## 2014-01-16 MED ORDER — ALBUTEROL SULFATE HFA 108 (90 BASE) MCG/ACT IN AERS: 2.0000 | INHALATION_SPRAY | Freq: Four times a day (QID) | RESPIRATORY_TRACT | Status: DC | PRN

## 2014-02-16 ENCOUNTER — Other Ambulatory Visit: Payer: Self-pay | Admitting: Internal Medicine

## 2014-03-02 ENCOUNTER — Other Ambulatory Visit: Payer: Self-pay | Admitting: *Deleted

## 2014-03-02 MED ORDER — LOSARTAN POTASSIUM 50 MG PO TABS: ORAL_TABLET | ORAL | Status: DC

## 2014-03-02 NOTE — Telephone Encounter (Signed)
[facility].  

## 2014-03-03 ENCOUNTER — Ambulatory Visit (INDEPENDENT_AMBULATORY_CARE_PROVIDER_SITE_OTHER)
Admission: RE | Admit: 2014-03-03 | Discharge: 2014-03-03 | Disposition: A | Discharge status: Home / Self Care | Payer: Managed Care, Other (non HMO) | Source: Ambulatory Visit | Attending: Internal Medicine | Admitting: Internal Medicine

## 2014-03-03 DIAGNOSIS — R918 Other nonspecific abnormal finding of lung field: Secondary | ICD-10-CM

## 2014-03-03 DIAGNOSIS — J438 Other emphysema: Secondary | ICD-10-CM

## 2014-03-03 IMAGING — CT CT CHEST W/O CM
2 of 4 series · 15 of 36 positions shown, 18 images · IV contrast (Omnipaque 300)
Comparison: Multiple prior studies including [DATE] CT scan

CLINICAL DATA: Followup pulmonary nodules

EXAM:
CT CHEST WITHOUT CONTRAST
TECHNIQUE: Multidetector CT imaging of the chest was performed following the
standard protocol without IV contrast.

[Series 2: chest routine with · axial · 0.79mm/px · z∈[-271,-16]mm · 12 of 61 slices shown, 15 images]
[im 5/61  mediastinal]
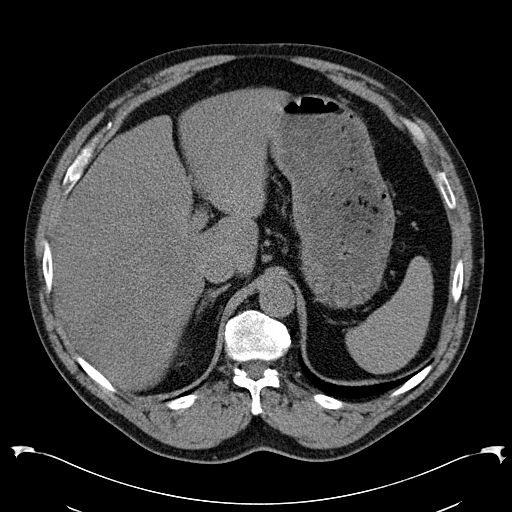
[im 5/61  lung]
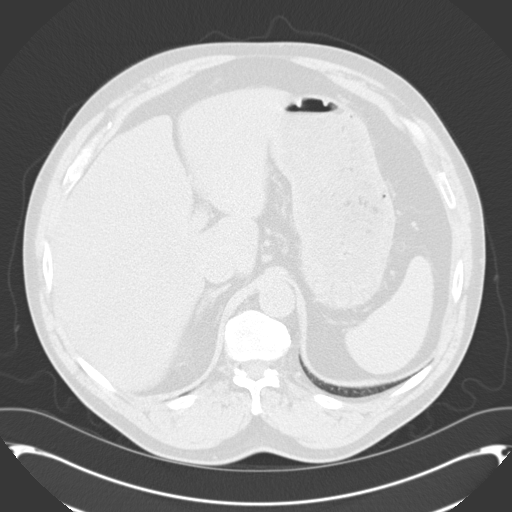
[im 10/61  lung]
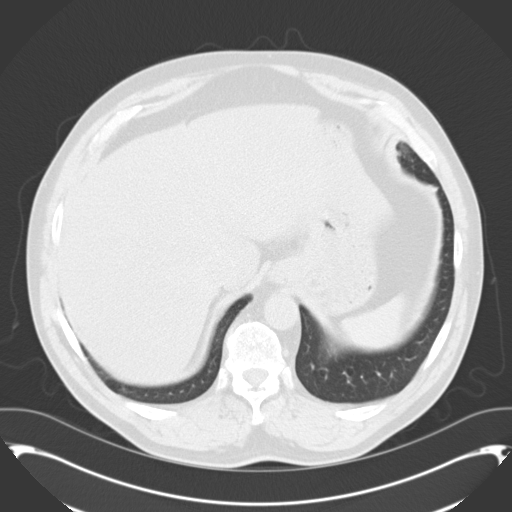
[im 14/61  lung]
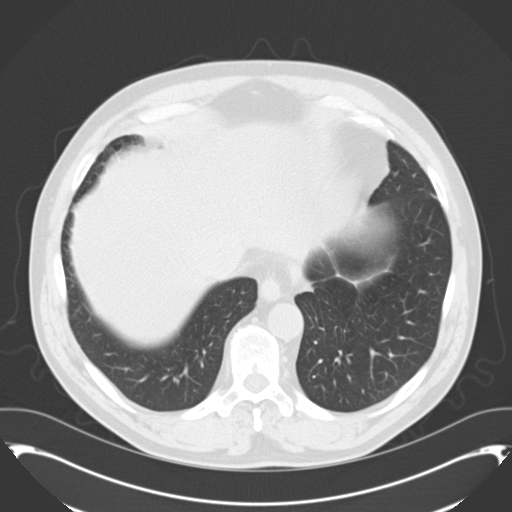
[im 19/61  lung]
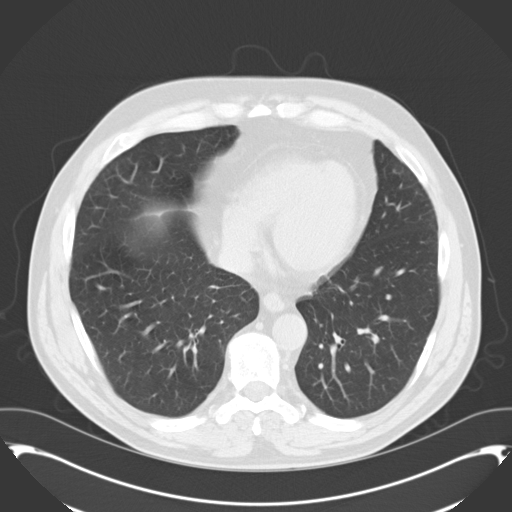
[im 24/61  mediastinal]
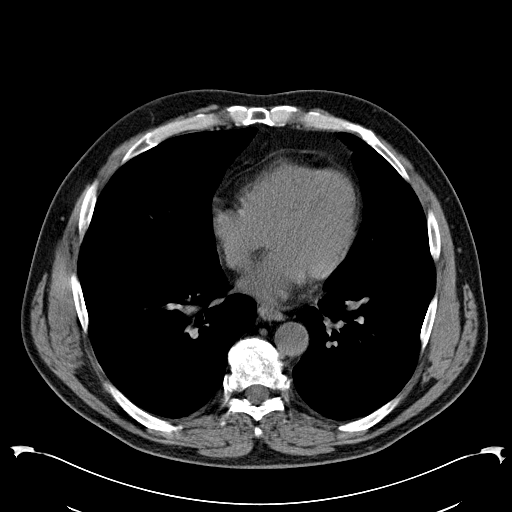
[im 24/61  lung]
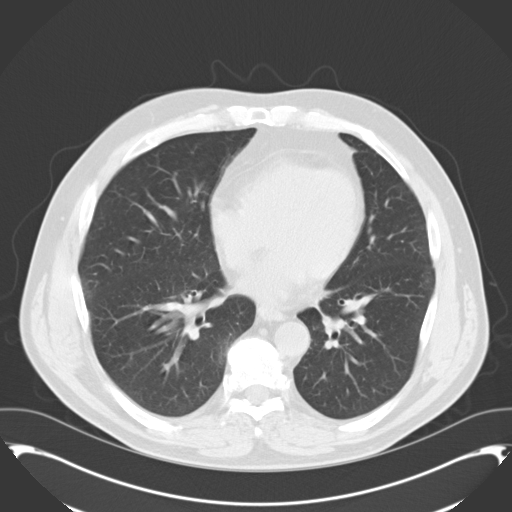
[im 28/61  lung]
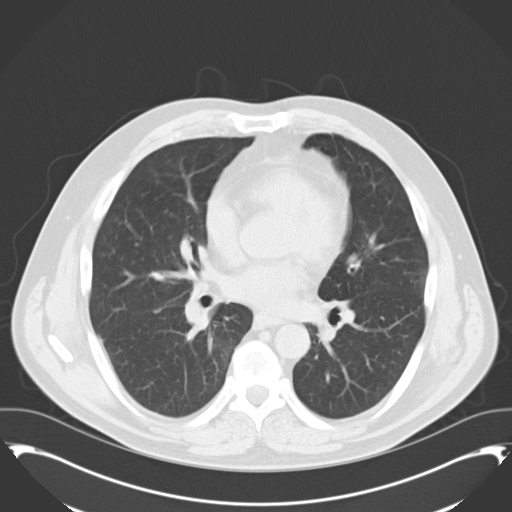
[im 33/61  lung]
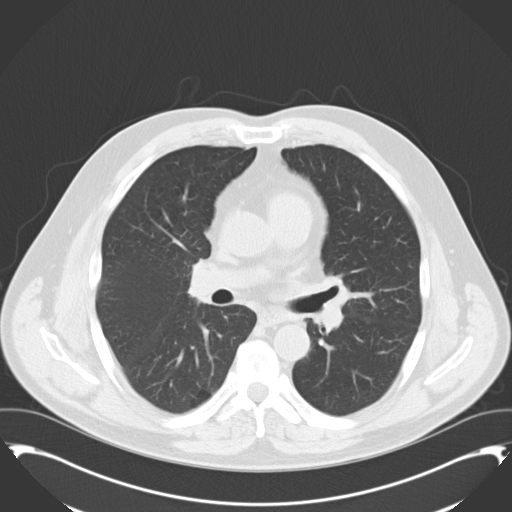
[im 37/61  lung]
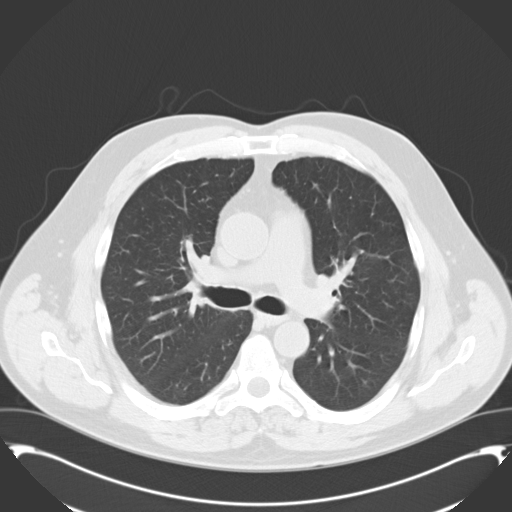
[im 42/61  mediastinal]
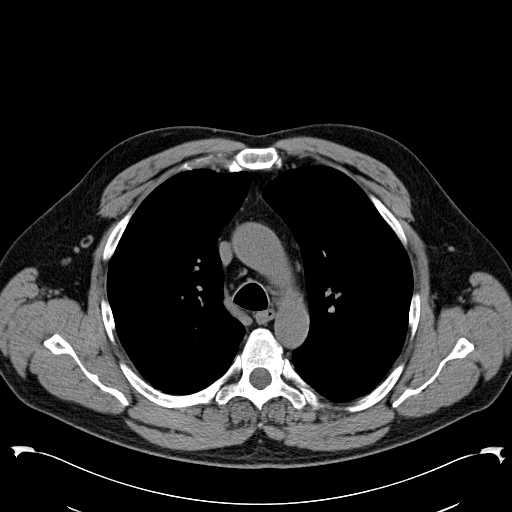
[im 42/61  lung]
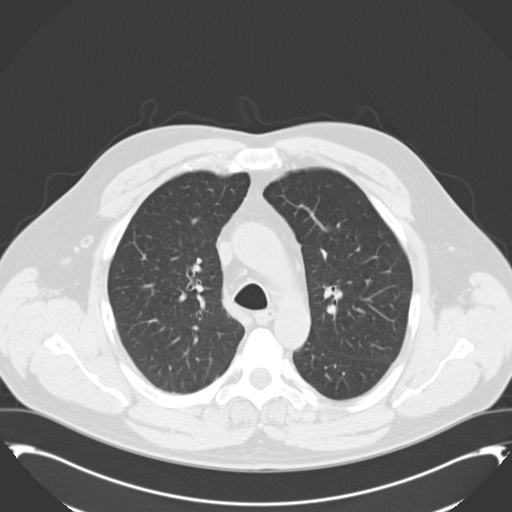
[im 47/61  lung]
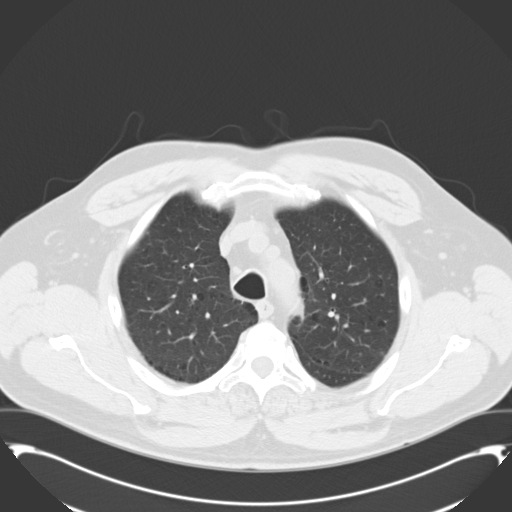
[im 51/61  lung]
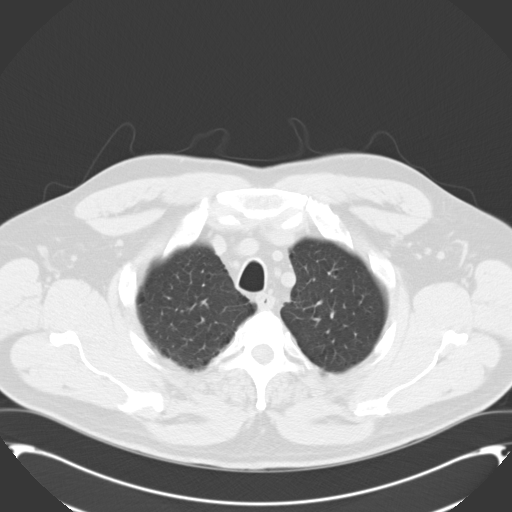
[im 56/61  lung]
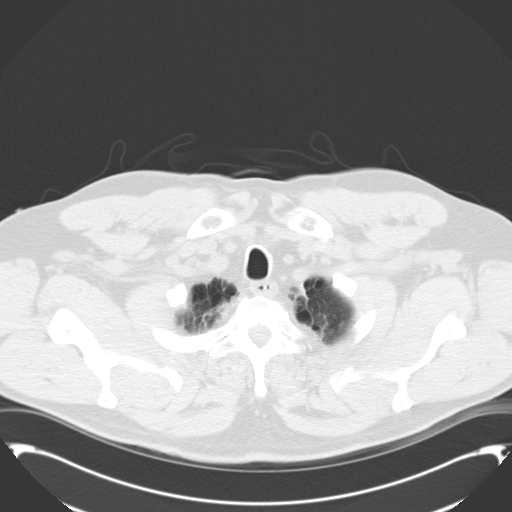

[Series 602: cor · coronal · 0.79mm/px · 3 of 125 slices shown]
[im 25/125  lung]
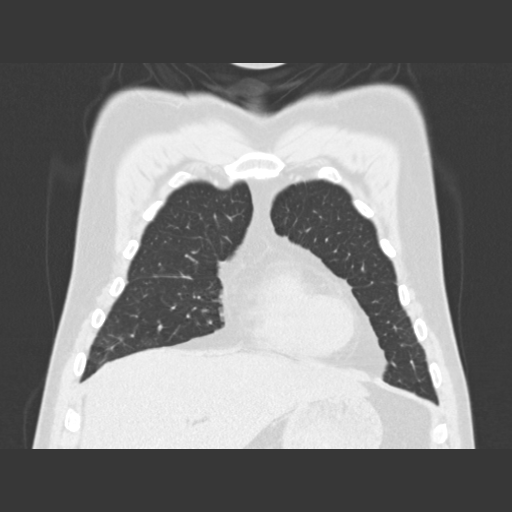
[im 50/125  lung]
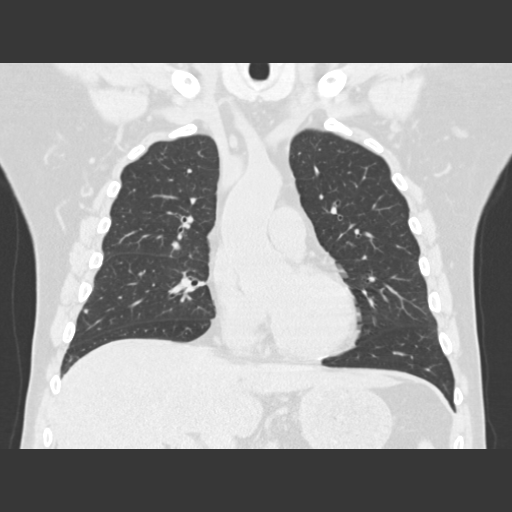
[im 75/125  lung]
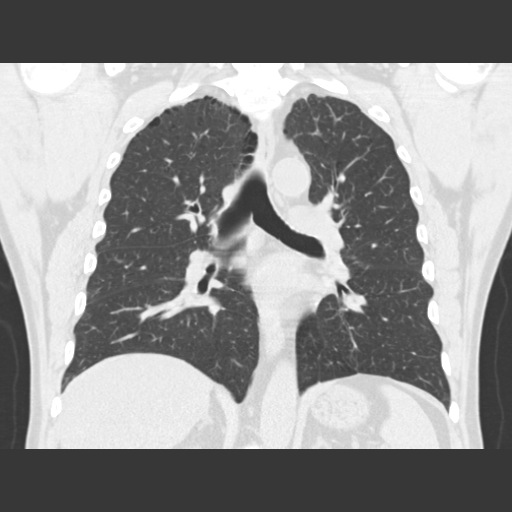

[15 of 36 positions shown; findings below may reference images not displayed]

FINDINGS: Mild paraseptal emphysema stable. Pulmonary nodules are stable.
These include a 5 mm nodule right middle lobe image number 32, a 3
mm nodule right middle lobe image number 39, a 2 mm nodule right
lower lobe image number 44, and a left lower lobe 4 mm nodule image
number 40. A 2 mm nodule in the lingula image number 33 also appears
to be stable. Mild nodularity centrally in the right lung on image
number 33 corresponds to mild focal thickening of the fissure rather
than a true pulmonary nodule.

There is no suspicious adenopathy. There is no pleural or
pericardial effusion.

There is fatty infiltration of the liver. There are no acute
musculoskeletal findings.
IMPRESSION: Stable bilateral pulmonary nodules the largest measuring 5 mm. These
have been stable since [DATE]. Typically, documentation of at
least 12 month stability is suggested to confirm benignity.

## 2014-03-06 ENCOUNTER — Telehealth: Payer: Self-pay | Admitting: Internal Medicine

## 2014-03-06 DIAGNOSIS — R918 Other nonspecific abnormal finding of lung field: Secondary | ICD-10-CM

## 2014-03-06 NOTE — Telephone Encounter (Signed)
Stable nodule in 1 year. So benign nodule. Given smoking hx: recommend another CT chest wo contrast (low dose protocol) but indcation is fu nodule in  1 year   IMPRESSION:  Stable bilateral pulmonary nodules the largest measuring 5 mm. These  have been stable since June 2014. Typically, documentation of at  least 12 month stability is suggested to confirm benignity.  Electronically Signed  By: Skipper Cliche M.D.  On: 03/03/2014 16:17    reports that he quit smoking about 18 months ago. His smoking use included Cigarettes. He has a 10.5 pack-year smoking history. His smokeless tobacco use includes Snuff.

## 2014-03-06 NOTE — Telephone Encounter (Signed)
Pt had CT 03/03/14. Please advise MR thanks

## 2014-03-07 NOTE — Telephone Encounter (Signed)
I spoke with patient about results and he verbalized understanding and had no questions 

## 2014-03-10 ENCOUNTER — Other Ambulatory Visit: Payer: Self-pay | Admitting: Internal Medicine

## 2014-04-04 ENCOUNTER — Encounter: Payer: Self-pay | Admitting: Internal Medicine

## 2014-04-04 ENCOUNTER — Ambulatory Visit (INDEPENDENT_AMBULATORY_CARE_PROVIDER_SITE_OTHER): Payer: 59 | Admitting: Internal Medicine

## 2014-04-04 VITALS — BP 122/80 | HR 84 | Temp 97.8°F | Wt 238.0 lb

## 2014-04-04 DIAGNOSIS — I10 Essential (primary) hypertension: Secondary | ICD-10-CM

## 2014-04-04 DIAGNOSIS — K760 Fatty (change of) liver, not elsewhere classified: Secondary | ICD-10-CM | POA: Insufficient documentation

## 2014-04-04 DIAGNOSIS — K219 Gastro-esophageal reflux disease without esophagitis: Secondary | ICD-10-CM

## 2014-04-04 DIAGNOSIS — R918 Other nonspecific abnormal finding of lung field: Secondary | ICD-10-CM | POA: Insufficient documentation

## 2014-04-04 DIAGNOSIS — M109 Gout, unspecified: Secondary | ICD-10-CM

## 2014-04-04 DIAGNOSIS — E785 Hyperlipidemia, unspecified: Secondary | ICD-10-CM

## 2014-04-04 DIAGNOSIS — K7689 Other specified diseases of liver: Secondary | ICD-10-CM

## 2014-04-04 MED ORDER — PANTOPRAZOLE SODIUM 20 MG PO TBEC: 20.0000 mg | DELAYED_RELEASE_TABLET | Freq: Every day | ORAL | Status: DC

## 2014-04-04 NOTE — Patient Instructions (Signed)
Fatty Liver °Fatty liver is the accumulation of fat in liver cells. It is also called hepatosteatosis or steatohepatitis. It is normal for your liver to contain some fat. If fat is more than 5 to 10% of your liver's weight, you have fatty liver.  °There are often no symptoms (problems) for years while damage is still occurring. People often learn about their fatty liver when they have medical tests for other reasons. Fat can damage your liver for years or even decades without causing problems. When it becomes severe, it can cause fatigue, weight loss, weakness, and confusion. °This makes you more likely to develop more serious liver problems. The liver is the largest organ in the body. It does a lot of work and often gives no warning signs when it is sick until late in a disease. °The liver has many important jobs including: °· Breaking down foods. °· Storing vitamins, iron, and other minerals. °· Making proteins. °· Making bile for food digestion. °· Breaking down many products including medications, alcohol and some poisons. °CAUSES  °There are a number of different conditions, medications, and poisons that can cause a fatty liver. Eating too many calories causes fat to build up in the liver. Not processing and breaking fats down normally may also cause this. Certain conditions, such as obesity, diabetes, and high triglycerides also cause this. Most fatty liver patients tend to be middle-aged and over weight.  °Some causes of fatty liver are: °· Alcohol over consumption. °· Malnutrition. °· Steroid use. °· Valproic acid toxicity. °· Obesity. °· Cushing's syndrome. °· Poisons. °· Tetracycline in high dosages. °· Pregnancy. °· Diabetes. °· Hyperlipidemia. °· Rapid weight loss. °Some people develop fatty liver even having none of these conditions. °SYMPTOMS  °Fatty liver most often causes no problems. This is called asymptomatic. °· It can be diagnosed with blood tests and also by a liver biopsy. °· It is one of the  most common causes of minor elevations of liver enzymes on routine blood tests. °· Specialized Imaging of the liver using ultrasound, CT (computed tomography) scan, or MRI (magnetic resonance imaging) can suggest a fatty liver but a biopsy is needed to confirm it. °· A biopsy involves taking a small sample of liver tissue. This is done by using a needle. It is then looked at under a microscope by a specialist. °TREATMENT  °It is important to treat the cause. Simple fatty liver without a medical reason may not need treatment. °· Weight loss, fat restriction, and exercise in overweight patients produces inconsistent results but is worth trying. °· Fatty liver due to alcohol toxicity may not improve even with stopping drinking. °· Good control of diabetes may reduce fatty liver. °· Lower your triglycerides through diet, medication or both. °· Eat a balanced, healthy diet. °· Increase your physical activity. °· Get regular checkups from a liver specialist. °· There are no medical or surgical treatments for a fatty liver or NASH, but improving your diet and increasing your exercise may help prevent or reverse some of the damage. °PROGNOSIS  °Fatty liver may cause no damage or it can lead to an inflammation of the liver. This is, called steatohepatitis. When it is linked to alcohol abuse, it is called alcoholic steatohepatitis. It often is not linked to alcohol. It is then called nonalcoholic steatohepatitis, or NASH. Over time the liver may become scarred and hardened. This condition is called cirrhosis. Cirrhosis is serious and may lead to liver failure or cancer. NASH is one of the   leading causes of cirrhosis. About 10-20% of Americans have fatty liver and a smaller 2-5% has NASH. °Document Released: 12/26/2005 Document Revised: 02/02/2012 Document Reviewed: 02/18/2006 °[employer]® Patient Information ©2014 [employer]. ° °

## 2014-04-04 NOTE — Progress Notes (Signed)
Patient ID: Aaron [name], male   DOB: Jan 31, 1956, 58 y.o.   MRN: [id number]    Chief Complaint  Patient presents with  . Medical Management of Chronic Issues    4 month follow-up, dicuss CT Scan of Chest    No Known Allergies  HPI 58 y/o male patient is here for follow up.  His daughter got diagnosed with lupus and was on prednisone and that mae her "go crazy". This had stressed him. He mentions that she is doing better now and he feels relieved Took himself off lexapro and mood has been ok.  Sleeping ok at night He is concerned about the ct chest findings- nodule and fatty liver His breathing is stable Reflux under control  Review of Systems  Constitutional: Negative for fever, chills, weight loss, malaise/fatigue and diaphoresis.  HENT: Negative for congestion, hearing loss and sore throat.   Eyes: Negative for blurred vision, double vision and discharge.  Respiratory: Negative for cough, sputum production, shortness of breath and wheezing.   Cardiovascular: Negative for chest pain, palpitations, orthopnea and leg swelling.  Gastrointestinal: Negative for heartburn, nausea, vomiting, abdominal pain, diarrhea and constipation.  Genitourinary: Negative for dysuria, urgency, frequency and flank pain.  Musculoskeletal: Negative for back pain, falls, joint pain and myalgias.  Skin: Negative for itching and rash.  Neurological: Negative for dizziness, tingling, focal weakness and headaches.  Psychiatric/Behavioral: Negative for depression and memory loss. The patient is not nervous/anxious.      Past Medical History  Diagnosis Date  . Hyperlipidemia   . Gout   . Rash 2011    left chest, biopsy 2012 Dr. Rozann Lesches, results pending  . Dyspnea     normal PFT April 2012  . Tobacco abuse   . Pneumonia     hx of and hx of bronchitis  . GERD (gastroesophageal reflux disease)   . H/O hiatal hernia   . Arthritis     lower back  . Asthma     followed by Dr. Berdie Ogren  . HTN  (hypertension)     hx of sees Dr. Graylin Shiver  . Abdominal aortic aneurysm     sees Dr. Einar Gip for cardiac f/u, [phone]  . Other abnormal blood chemistry   . Insomnia, unspecified   . External hemorrhoids without mention of complication   . Other testicular dysfunction   . Other specified erythematous condition(695.89)   . Other dyspnea and respiratory abnormality   . Prostatitis, unspecified   . Other malaise and fatigue   . Chronic airway obstruction, not elsewhere classified    Medication reviewed. See [facility]  Physical exam BP 122/80  Pulse 84  Temp(Src) 97.8 F (36.6 C) (Oral)  Wt 238 lb (107.956 kg)  SpO2 97%  General- adult male in no acute distress, overweight Head- atraumatic, normocephalic Eyes- PERRLA, EOMI, no pallor, no icterus Mouth- left lateral margin of the tongue has white plaque on it, not removable to scraping Neck- no lymphadenopathy, no thyromegaly, no jugular vein distension, no carotid bruit Chest- no chest wall deformities, no chest wall tenderness Cardiovascular- normal s1,s2, no murmurs/ rubs/ gallops Respiratory- bilateral clear to auscultation, no wheeze, no rhonchi, no crackles Abdomen- bowel sounds present, soft, non tender, distended Musculoskeletal- able to move all 4 extremities, no spinal and paraspinal tenderness, steady gait, no use of assistive device Neurological- no focal deficit Psychiatry- alert and oriented to person, place and time, normal mood and affect  Labs- CMP     Component Value Date/Time   NA 141 12/02/2013  0815   NA 139 08/22/2013 0740   K 4.5 12/02/2013 0815   CL 103 12/02/2013 0815   CO2 25 12/02/2013 0815   GLUCOSE 105* 12/02/2013 0815   GLUCOSE 158* 08/22/2013 0740   BUN 11 12/02/2013 0815   BUN 14 08/22/2013 0740   CREATININE 0.99 12/02/2013 0815   CALCIUM 9.6 12/02/2013 0815   PROT 7.0 12/02/2013 0815   AST 21 12/02/2013 0815   ALT 25 12/02/2013 0815   ALKPHOS 96 12/02/2013 0815   BILITOT 0.6 12/02/2013 0815   GFRNONAA 85 12/02/2013  0815   GFRAA 99 12/02/2013 0815   CBC    Component Value Date/Time   WBC 6.0 12/02/2013 0815   WBC 6.0 08/22/2013 0740   RBC 4.78 12/02/2013 0815   RBC 4.46 08/22/2013 0740   HGB 14.2 12/02/2013 0815   HCT 41.5 12/02/2013 0815   PLT 227 08/22/2013 0740   MCV 87 12/02/2013 0815   MCH 29.7 12/02/2013 0815   MCH 30.7 08/22/2013 0740   MCHC 34.2 12/02/2013 0815   MCHC 33.9 08/22/2013 0740   RDW 13.1 12/02/2013 0815   RDW 12.6 08/22/2013 0740   LYMPHSABS 1.6 12/02/2013 0815   EOSABS 0.1 12/02/2013 0815   BASOSABS 0.0 12/02/2013 0815   Lipid Panel     Component Value Date/Time   TRIG 79 12/02/2013 0815   HDL 37* 12/02/2013 0815   CHOLHDL 3.4 12/02/2013 0815   LDLCALC 74 12/02/2013 0815    Assessment/ plan  1. Gout Recheck uric acid level. Continue allopurinol - Uric acid  2. Fatty liver Noted on ct chest. Encouraged weight loss. Cholesterol level under control at present. Continue statin. Avoid alcohol consumption. Reading literature on fatty liver provided to patient. No hepatomegaly on exam. Monitor clinically with lft and lipid panel follow up - Hepatic function panel  3. GERD (gastroesophageal reflux disease) Will decrease protonix to 20 mg daily and monitor symptom. Exercise, dietary changes and weight loss recommended  4. HYPERTENSION Well controlled bp readings. Continue losartan 50 mg daily  5. Pulmonary nodules Stable on review in CT chest. Monitor clinically.  6. Hyperlipidemia Continue zocor 20 mg daily and monitor lipid panel

## 2014-04-05 ENCOUNTER — Encounter: Payer: Self-pay | Admitting: *Deleted

## 2014-04-05 LAB — HEPATIC FUNCTION PANEL
ALT: 30 IU/L (ref 0–44)
AST: 27 IU/L (ref 0–40)
Albumin: 4.4 g/dL (ref 3.5–5.5)
Alkaline Phosphatase: 93 IU/L (ref 39–117)
BILIRUBIN DIRECT: 0.11 mg/dL (ref 0.00–0.40)
Total Bilirubin: 0.3 mg/dL (ref 0.0–1.2)
Total Protein: 6.9 g/dL (ref 6.0–8.5)

## 2014-04-05 LAB — URIC ACID: URIC ACID: 6.3 mg/dL (ref 3.7–8.6)

## 2014-04-13 ENCOUNTER — Encounter: Payer: Self-pay | Admitting: Internal Medicine

## 2014-04-13 ENCOUNTER — Ambulatory Visit (INDEPENDENT_AMBULATORY_CARE_PROVIDER_SITE_OTHER): Payer: Managed Care, Other (non HMO) | Admitting: Internal Medicine

## 2014-04-13 VITALS — BP 112/80 | HR 89 | Temp 98.6°F | Ht 74.0 in | Wt 237.4 lb

## 2014-04-13 DIAGNOSIS — R053 Chronic cough: Secondary | ICD-10-CM

## 2014-04-13 DIAGNOSIS — R059 Cough, unspecified: Secondary | ICD-10-CM

## 2014-04-13 DIAGNOSIS — R918 Other nonspecific abnormal finding of lung field: Secondary | ICD-10-CM

## 2014-04-13 DIAGNOSIS — R05 Cough: Secondary | ICD-10-CM

## 2014-04-13 DIAGNOSIS — J438 Other emphysema: Secondary | ICD-10-CM

## 2014-04-13 NOTE — Patient Instructions (Addendum)
#  Chronic cough with asthma/emphysema - Glad you are much better and cough is nearly resolved - Continue Dulera 100/52 puff 2 times daily; use with a spacer - Use albuterol as needed - Continue steroid Nasacort or Nasonex over-the-counter 2 squirts in each nostril once daily -  Avoid colas spices cheeses pizzas and fried food - Continue acid reflux Protonix   #Lung nodule - Have CT scan without contrast low dose protocol (for followup of lung nodule) of the chest in 12 months  Followup - 12 month after CT chest

## 2014-04-13 NOTE — Progress Notes (Signed)
Subjective:    Patient ID: Aaron [name], male    DOB: 03-19-1956, 58 y.o.   MRN: [id number]  HPI   HPI #Smoker  - 52 pack. Quit October 2013    #Ex-etoh  - quit 2011  #ASthma/Emphysema Dyspnea  - normal cardiac stress test 2006 and in October 2013  - CXR 2011 - hyperinflation  - PFTs April 2012 (off advair ) - normal.  -  methacholine challenge test 03/05/2011: POSITIVE FOR ASTHMA (PC 20 <4). - CT June 2014: mild emphysema (dad had emphysema)  #Maintenance of health  - pneumovax 2012       OV 03/21/2013  Routine office visit for the above. He presents with his wife Aaron [name] who is here with him for the first time.  - He underwent abdominal Arctic aneurysm repair in October 2013. Surgery was uneventful. He has been doing well. It appears that postoperatively he was placed on lisinopril for hypertension control. Sometime shortly after that but especially so in the last 1-2 months he and his wife reporting insidious onset of cough that is associated with sputum and some wheezing. that is present all day. Rated as moderate in severity. Associated volume of sputum is small and clear. There is no associated hemoptysis no weight loss or wheezing. RSI cough score is 28 and suggest irritable larynx cough. Am happy to note that he quit smoking October 2013. He is also compliant with his Advair which I do note is a dry powdered inhaler   REC   #cough  - please stop lisinopril 10mg ; suspect this is contributing to cough  - instead start losartan 25mg  daily  - continue advair 250/50, 1 puff twice daily  - return in 1 month  - cough score at followup  - spirometry at followup  #BP  - monitor BP as before per advice of your cardiologist/primary care but with the changes  - any BP issues call them  #Followup  1 month with spirometry and Cough score at followup   OV 04/20/2013 Presents with wife Aaron [name]. . He's here to evaluate   - cough and dyspnea after stopping lisinopril.  -  But also checking his blood pressure after changing his lisinopril to losartan. - Review his smoking: Still in remission since October 2013  Currently he is on  advair: Spirometry shows an FEV1 of 3.3L/79% and reduced ratio. Consistent with mild early obstruction possibly. Still with mucus and cough. 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 His RSI cough score is improved to 14 shows a 50% reduction in cough severe he but subjectively he does not feel any better. In fact is complaining of mucus production. He is frustrated that despite quitting smoking that cough has not resolved and he feels that he might as well go back to smoking again.   REC Chronic cough with asthma - Stop Advair - Start Dulera 100/52 puff 2 times daily; use with a spacer - Use albuterol as needed - Start nasal steroid Nasacort or Nasonex over-the-counter 2 squirts in each nostril once daily - Start 3% hypertonic saline nasal spray made that company called [employer] 2 squirts in each nostril once at night - Avoid colas spices cheeses pizzas and fried food - Continue acid reflux Protonix - Have CT scan of the chest on 04/26/2013   Followup - 1 month with cough score - Shortness of breath still persists at followup we'll do bike pulmonary stress test   Telephone call June 2014 CT 04/26/13  -  just mild emphysema c/w breathing test - small nodules max 22mm that are unlikely lung cancer but need fu ct in 9 months   -  uThere are multiple small bilateral pulmonary nodules including 5 mm  nodules on images 33 and 34 of series 4 in the right midzone as  well as 4 mm nodules on image number 40 and 44 in the right middle  and lower lobes respectively. There is also a 4 mm nodule on image  number 47 in the left lower lobe.    OV 05/25/2013  .DYspnea:  Much improved. Moving furniture and heavy objects. No dyspnea now after starting dulera. CT June 2014 - shows mild empysema  Cough: hugely better. RSI Cough score down  to 8. Only mild cough. Feels current quality of life is good. Feels dulera helped a lot. Using OTC nasal steroid, Not using saloine nasal spray  New issue: severeal scatterred pulmnary nodules < 5 mm both sides in CT June 2014  #Chronic cough with asthma/emphysema - Glad you are much better - Continue Dulera 100/52 puff 2 times daily; use with a spacer - Use albuterol as needed - Continue steroid Nasacort or Nasonex over-the-counter 2 squirts in each nostril once daily - Start 3% hypertonic saline nasal spray made that company called [employer] 2 squirts in each nostril once at night - Avoid colas spices cheeses pizzas and fried food - Continue acid reflux Protonix  #Lung nodule - Have CT scan without contrast of the chest in 9 months  Followup - 9 month with cough score  OV 04/13/2014  Chief Complaint  Patient presents with  . Follow-up    pt states cough is mostly resolved.   Chronic cough - multifactorial with CT evidence of mild emphysema: essentially resolved. Doees not want to give up dulera. RSI cough score is 2  Pulmonary nodules: stable since June 2015, 67mm nodules  Tobacco:  reports that he quit smoking about 19 months ago. His smoking use included Cigarettes. He has a 10.5 pack-year smoking history. His smokeless tobacco use includes Snuff.    IMPRESSION: CT chest ap[ril 2015 Stable bilateral pulmonary nodules the largest measuring 5 mm. These  have been stable since June 2014. Typically, documentation of at  least 12 month stability is suggested to confirm benignity.  Electronically Signed  By: Skipper Cliche M.D.  On: 03/03/2014 16:17    Dr Lorenza [place] Reflux Symptom Index (> 13-15 suggestive of LPR cough) 0 -> 5  =  none ->severe problem 03/21/2013 On ace inhibitor and advair disck 04/20/2013 After stopping ace inhibitor 05/25/2013 After starting dulera, OTC nasal steroid, continued ppi 04/13/2014   Hoarseness of problem with voice 1 3 0 0  Clearing  Of Throat 5 3  2 1   Excess throat mucus or feeling of post nasal drip 5 3 4 1   Difficulty swallowing food, liquid or tablets 2 0 0 0  Cough after eating or lying down 4 1 0 0  Breathing difficulties or choking episodes 3 4 0 0  Troublesome or annoying cough 1 0 0 0  Sensation of something sticking in throat or lump in throat 3 0 0 0  Heartburn, chest pain, indigestion, or stomach acid coming up 4 0 2 0  TOTAL 28 14 8 2         Past, Family, Social reviewed: His cardiologist Dr.Ganji is placed on antidepressant Lexapro a week ago    Review of Systems  Constitutional: Negative for fever and unexpected weight change.  HENT: Negative for congestion, dental problem, ear pain, nosebleeds, postnasal drip, rhinorrhea, sinus pressure, sneezing, sore throat and trouble swallowing.   Eyes: Negative for redness and itching.  Respiratory: Negative for cough, chest tightness, shortness of breath and wheezing.   Cardiovascular: Negative for palpitations and leg swelling.  Gastrointestinal: Negative for nausea and vomiting.  Genitourinary: Negative for dysuria.  Musculoskeletal: Negative for joint swelling.  Skin: Negative for rash.  Neurological: Negative for headaches.  Hematological: Does not bruise/bleed easily.  Psychiatric/Behavioral: Negative for dysphoric mood. The patient is not nervous/anxious.    Current outpatient prescriptions:albuterol (PROVENTIL HFA;VENTOLIN HFA) 108 (90 BASE) MCG/ACT inhaler, Inhale 2 puffs into the lungs every 6 (six) hours as needed. For cough and wheezing., Disp: 8.5 g, Rfl: 5;  aspirin 81 MG tablet, Take 81 mg by mouth daily., Disp: , Rfl: ;  losartan (COZAAR) 50 MG tablet, Take one tablet by mouth once daily to control blood pressure, Disp: 30 tablet, Rfl: 6 mometasone (NASONEX) 50 MCG/ACT nasal spray, Place 2 sprays into the nose daily., Disp: , Rfl: ;  mometasone-formoterol (DULERA) 100-5 MCG/ACT AERO, Inhale 2 puffs into the lungs 2 (two) times daily., Disp: 3 Inhaler, Rfl:  3;  pantoprazole (PROTONIX) 20 MG tablet, Take 1 tablet (20 mg total) by mouth daily., Disp: 30 tablet, Rfl: 5;  simvastatin (ZOCOR) 20 MG tablet, Take one tablet by mouth daily at bedtime for cholesterol, Disp: 30 tablet, Rfl: 5     Objective:   Physical Exam  Nursing note and vitals reviewed. Constitutional: He is oriented to person, place, and time. He appears well-developed and well-nourished. No distress.  HENT:  Head: Normocephalic and atraumatic.  Right Ear: External ear normal.  Left Ear: External ear normal.  Mouth/Throat: Oropharynx is clear and moist. No oropharyngeal exudate.  Eyes: Conjunctivae and EOM are normal. Pupils are equal, round, and reactive to light. Right eye exhibits no discharge. Left eye exhibits no discharge. No scleral icterus.  Neck: Normal range of motion. Neck supple. No JVD present. No tracheal deviation present. No thyromegaly present.  Cardiovascular: Normal rate, regular rhythm and intact distal pulses.  Exam reveals no gallop and no friction rub.   No murmur heard. Pulmonary/Chest: Effort normal and breath sounds normal. No respiratory distress. He has no wheezes. He has no rales. He exhibits no tenderness.  Visceral obesity  Abdominal: Soft. Bowel sounds are normal. He exhibits no distension and no mass. There is no tenderness. There is no rebound and no guarding.  Musculoskeletal: Normal range of motion. He exhibits no edema and no tenderness.  Lymphadenopathy:    He has no cervical adenopathy.  Neurological: He is alert and oriented to person, place, and time. He has normal reflexes. No cranial nerve deficit. Coordination normal.  Skin: Skin is warm and dry. No rash noted. He is not diaphoretic. No erythema. No pallor.  Psychiatric: He has a normal mood and affect. His behavior is normal. Judgment and thought content normal.    Filed Vitals:   04/13/14 1640  BP: 112/80  Pulse: 89  Temp: 98.6 F (37 C)  TempSrc: Oral  Height: 6\' 2"  (1.88 m)    Weight: 237 lb 6.4 oz (107.684 kg)  SpO2: 97%         Assessment & Plan:

## 2014-04-17 NOTE — Assessment & Plan Note (Signed)
#  Chronic cough with asthma/emphysema - Glad you are much better and cough is nearly resolved - Continue Dulera 100/52 puff 2 times daily; use with a spacer - Use albuterol as needed - Continue steroid Nasacort or Nasonex over-the-counter 2 squirts in each nostril once daily -  Avoid colas spices cheeses pizzas and fried food - Continue acid reflux Protonix   Followup - 12 month after CT chest

## 2014-04-17 NOTE — Assessment & Plan Note (Signed)
#  Lung nodule - Have CT scan without contrast low dose protocol (for followup of lung nodule) of the chest in 12 months

## 2014-06-13 ENCOUNTER — Other Ambulatory Visit: Payer: Self-pay | Admitting: Internal Medicine

## 2014-07-26 ENCOUNTER — Other Ambulatory Visit: Payer: Self-pay | Admitting: Internal Medicine

## 2014-08-08 ENCOUNTER — Ambulatory Visit (INDEPENDENT_AMBULATORY_CARE_PROVIDER_SITE_OTHER): Payer: 59 | Admitting: Internal Medicine

## 2014-08-08 ENCOUNTER — Encounter: Payer: Self-pay | Admitting: Internal Medicine

## 2014-08-08 VITALS — BP 130/82 | HR 74 | Temp 98.2°F | Wt 235.0 lb

## 2014-08-08 DIAGNOSIS — Z23 Encounter for immunization: Secondary | ICD-10-CM

## 2014-08-08 DIAGNOSIS — I10 Essential (primary) hypertension: Secondary | ICD-10-CM

## 2014-08-08 DIAGNOSIS — N529 Male erectile dysfunction, unspecified: Secondary | ICD-10-CM

## 2014-08-08 DIAGNOSIS — J45909 Unspecified asthma, uncomplicated: Secondary | ICD-10-CM

## 2014-08-08 DIAGNOSIS — J4540 Moderate persistent asthma, uncomplicated: Secondary | ICD-10-CM

## 2014-08-08 DIAGNOSIS — E785 Hyperlipidemia, unspecified: Secondary | ICD-10-CM

## 2014-08-08 NOTE — Progress Notes (Signed)
Patient ID: Aaron [name], male   DOB: Jul 01, 1956, 58 y.o.   MRN: [id number]    Chief Complaint  Patient presents with  . Medical Management of Chronic Issues    Follow-up, no concerns   No Known Allergies  No Known Allergies  HPI 58 y/o male patient is here for follow up.  His breathing is stable Reflux under control Normal uric acid level in past and now is off allopurinol.  No recent gout flare.  Review of Systems   Constitutional: Negative for fever, chills, weight loss, malaise/fatigue and diaphoresis.   HENT: Negative for congestion, hearing loss and sore throat.    Respiratory: Negative for cough, sputum production, shortness of breath and wheezing.    Cardiovascular: Negative for chest pain, palpitations, orthopnea and leg swelling.   Gastrointestinal: Negative for heartburn, nausea, vomiting, abdominal pain, diarrhea and constipation.   Musculoskeletal: Negative for back pain, falls, joint pain and myalgias.   Neurological: Negative for dizziness, tingling, focal weakness and headaches.   Psychiatric/Behavioral: Negative for depression and memory loss. The patient is not nervous/anxious.    Medication reviewed. See [facility]   Physical exam BP 130/82  Pulse 74  Temp(Src) 98.2 F (36.8 C) (Oral)  Wt 235 lb (106.595 kg)  SpO2 98%  General- adult male in no acute distress, overweight Cardiovascular- normal s1,s2, no murmurs/ rubs/ gallops Respiratory- bilateral clear to auscultation, no wheeze, no rhonchi, no crackles Abdomen- bowel sounds present, soft, non tender, distended Musculoskeletal- able to move all 4 extremities, no spinal and paraspinal tenderness, steady gait, no use of assistive device Neurological- no focal deficit Psychiatry- alert and oriented to person, place and time, normal mood and affect  Assessment/plan  1. HYPERTENSION Stable, continue losartan 50 mg daily and baby aspirin - CMP - CMP with eGFR; Future - CBC with Differential; Future  2.  Asthma, moderate persistent, uncomplicated Stable, on dulera and prn albuterol - CBC with Differential; Future  3. HYPERLIPIDEMIA Continue zocor 20 mg daily - Lipid Panel; Future  4. Erectile dysfunction, unspecified erectile dysfunction type Check psa next lab, has not required sialis recently - PSA; Future

## 2014-08-09 LAB — COMPREHENSIVE METABOLIC PANEL
ALBUMIN: 4.6 g/dL (ref 3.5–5.5)
ALK PHOS: 90 IU/L (ref 39–117)
ALT: 28 IU/L (ref 0–44)
AST: 24 IU/L (ref 0–40)
Albumin/Globulin Ratio: 1.8 (ref 1.1–2.5)
BUN/Creatinine Ratio: 11 (ref 9–20)
BUN: 11 mg/dL (ref 6–24)
CO2: 21 mmol/L (ref 18–29)
Calcium: 9.5 mg/dL (ref 8.7–10.2)
Chloride: 102 mmol/L (ref 97–108)
Creatinine, Ser: 1.04 mg/dL (ref 0.76–1.27)
GFR calc Af Amer: 93 mL/min/{1.73_m2} (ref >59)
GFR calc non Af Amer: 80 mL/min/{1.73_m2} (ref >59)
GLUCOSE: 95 mg/dL (ref 65–99)
Globulin, Total: 2.5 g/dL (ref 1.5–4.5)
POTASSIUM: 4.8 mmol/L (ref 3.5–5.2)
Sodium: 145 mmol/L — ABNORMAL HIGH (ref 134–144)
Total Bilirubin: 0.3 mg/dL (ref 0.0–1.2)
Total Protein: 7.1 g/dL (ref 6.0–8.5)

## 2014-08-16 ENCOUNTER — Other Ambulatory Visit: Payer: Self-pay | Admitting: Internal Medicine

## 2014-08-23 ENCOUNTER — Telehealth: Payer: Self-pay | Admitting: *Deleted

## 2014-08-23 NOTE — Telephone Encounter (Signed)
Patient called and wants to know if he can have a muscle relaxer, he has taken Flexeril in the past, for his sciatic pain. Please Advise.

## 2014-08-24 MED ORDER — CYCLOBENZAPRINE HCL 5 MG PO TABS: ORAL_TABLET | ORAL | Status: DC

## 2014-08-24 NOTE — Telephone Encounter (Signed)
Ok to provide flexeril 5 mg every 12 hour as needed for pain

## 2014-08-24 NOTE — Telephone Encounter (Signed)
Patient Notified and faxed Rx to pharmacy. 

## 2014-08-26 ENCOUNTER — Emergency Department (HOSPITAL_COMMUNITY)
Admission: EM | Admit: 2014-08-26 | Discharge: 2014-08-26 | Disposition: A | Discharge status: Home / Self Care | Payer: Managed Care, Other (non HMO) | Attending: Emergency Medicine | Admitting: Emergency Medicine

## 2014-08-26 ENCOUNTER — Encounter (HOSPITAL_COMMUNITY): Payer: Self-pay | Admitting: Emergency Medicine

## 2014-08-26 DIAGNOSIS — H109 Unspecified conjunctivitis: Secondary | ICD-10-CM | POA: Diagnosis not present

## 2014-08-26 DIAGNOSIS — I10 Essential (primary) hypertension: Secondary | ICD-10-CM | POA: Insufficient documentation

## 2014-08-26 DIAGNOSIS — Z79899 Other long term (current) drug therapy: Secondary | ICD-10-CM | POA: Insufficient documentation

## 2014-08-26 DIAGNOSIS — Z792 Long term (current) use of antibiotics: Secondary | ICD-10-CM | POA: Insufficient documentation

## 2014-08-26 DIAGNOSIS — J449 Chronic obstructive pulmonary disease, unspecified: Secondary | ICD-10-CM | POA: Diagnosis not present

## 2014-08-26 DIAGNOSIS — Z87448 Personal history of other diseases of urinary system: Secondary | ICD-10-CM | POA: Insufficient documentation

## 2014-08-26 DIAGNOSIS — M5430 Sciatica, unspecified side: Secondary | ICD-10-CM | POA: Insufficient documentation

## 2014-08-26 DIAGNOSIS — K219 Gastro-esophageal reflux disease without esophagitis: Secondary | ICD-10-CM | POA: Insufficient documentation

## 2014-08-26 DIAGNOSIS — Z7982 Long term (current) use of aspirin: Secondary | ICD-10-CM | POA: Diagnosis not present

## 2014-08-26 DIAGNOSIS — Z8701 Personal history of pneumonia (recurrent): Secondary | ICD-10-CM | POA: Insufficient documentation

## 2014-08-26 DIAGNOSIS — E785 Hyperlipidemia, unspecified: Secondary | ICD-10-CM | POA: Diagnosis not present

## 2014-08-26 DIAGNOSIS — Z7951 Long term (current) use of inhaled steroids: Secondary | ICD-10-CM | POA: Diagnosis not present

## 2014-08-26 DIAGNOSIS — Z87891 Personal history of nicotine dependence: Secondary | ICD-10-CM | POA: Insufficient documentation

## 2014-08-26 DIAGNOSIS — Z872 Personal history of diseases of the skin and subcutaneous tissue: Secondary | ICD-10-CM | POA: Insufficient documentation

## 2014-08-26 DIAGNOSIS — M5431 Sciatica, right side: Secondary | ICD-10-CM

## 2014-08-26 DIAGNOSIS — H5711 Ocular pain, right eye: Secondary | ICD-10-CM | POA: Diagnosis present

## 2014-08-26 MED ORDER — PREDNISONE 20 MG PO TABS: 20.0000 mg | ORAL_TABLET | Freq: Two times a day (BID) | ORAL | Status: DC

## 2014-08-26 MED ORDER — FLUORESCEIN SODIUM 1 MG OP STRP
1.0000 | ORAL_STRIP | Freq: Once | OPHTHALMIC | Status: AC
  Administered 2014-08-26: 1 via OPHTHALMIC
  Filled 2014-08-26: qty 1

## 2014-08-26 MED ORDER — CIPROFLOXACIN HCL 0.3 % OP SOLN: OPHTHALMIC | Status: DC

## 2014-08-26 MED ORDER — TETRACAINE HCL 0.5 % OP SOLN
2.0000 [drp] | Freq: Once | OPHTHALMIC | Status: AC
  Administered 2014-08-26: 2 [drp] via OPHTHALMIC
  Filled 2014-08-26: qty 2

## 2014-08-26 MED ORDER — HYDROCODONE-ACETAMINOPHEN 5-325 MG PO TABS: 1.0000 | ORAL_TABLET | ORAL | Status: DC | PRN

## 2014-08-26 NOTE — ED Provider Notes (Signed)
CSN: [id number]     Arrival date & time 08/26/14  3428 History  This chart was scribed for Richarda Blade, MD by Ludger Nutting, ED Scribe. This patient was seen in room APA17/APA17 and the patient's care was started 9:54 AM.    Chief Complaint  Patient presents with  . Eye Pain  . Back Pain    The history is provided by the patient. No language interpreter was used.    HPI Comments: Aaron [name] is a 58 y.o. male who presents to the Emergency Department complaining of 1 week of constant, worsened right lower back pain. Patient states he was initially evaluated for this back pain about 2 years ago. 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 He takes OTC pain medication with mild relief. Patient has a history of AAA and asthma.   He also complains of constant, unchanged right eye redness that began last night but worsened this morning. He reports associated right eye pain and irritation. He states this began after removing a contact lens yesterday. He has applied lubricating eye drops without relief. He denies discharge.    Past Medical History  Diagnosis Date  . Hyperlipidemia   . Gout   . Rash 2011    left chest, biopsy 2012 Dr. Rozann Lesches, results pending  . Dyspnea     normal PFT April 2012  . Tobacco abuse   . Pneumonia     hx of and hx of bronchitis  . GERD (gastroesophageal reflux disease)   . H/O hiatal hernia   . Arthritis     lower back  . Asthma     followed by Dr. Berdie Ogren  . HTN (hypertension)     hx of sees Dr. Graylin Shiver  . Abdominal aortic aneurysm     sees Dr. Einar Gip for cardiac f/u, [phone]  . Other abnormal blood chemistry   . Insomnia, unspecified   . External hemorrhoids without mention of complication   . Other testicular dysfunction   . Other specified erythematous condition(695.89)   . Other dyspnea and respiratory abnormality   . Prostatitis, unspecified   . Other malaise  and fatigue   . Chronic airway obstruction, not elsewhere classified    Past Surgical History  Procedure Laterality Date  . Appendectomy    . Tonsillectomy    . Wrist surgery      left  . Toe surgery  01/2012    joint of left great toe  . Tympanoplasty    . Abdominal aortic endovascular stent graft  09/22/2012    Procedure: ABDOMINAL AORTIC ENDOVASCULAR STENT GRAFT;  Surgeon: Mal Misty, MD;  Location: [facility];  Service: Vascular;  Laterality: N/A;  GORE; ultrasound guided.   Family History  Problem Relation Age of Onset  . Heart attack Father 18  . COPD Father   . Heart disease Father   . Asthma Daughter   . Cancer Mother     Breast  . Lupus Daughter   . Diabetes Other     Grandson    History  Substance Use Topics  . Smoking status: Former Smoker -- 0.20 packs/day for 52.5 years    Types: Cigarettes    Quit date: 09/02/2012  . Smokeless tobacco: Current User    Types: Snuff  . Alcohol Use: No     Comment: past ETOH use quit 11-26-2009    Review of Systems  Eyes: Positive for  pain and redness. Negative for discharge.  Musculoskeletal: Positive for back pain.  All other systems reviewed and are negative.     Allergies  Review of patient's allergies indicates no known allergies.  Home Medications   Prior to Admission medications   Medication Sig Start Date End Date Taking? Authorizing Provider  albuterol (PROVENTIL HFA;VENTOLIN HFA) 108 (90 BASE) MCG/ACT inhaler Inhale 2 puffs into the lungs every 6 (six) hours as needed. For cough and wheezing. 01/16/14  Yes Tiffany L Reed, DO  aspirin 81 MG tablet Take 81 mg by mouth daily.   Yes Historical Provider, MD  cyclobenzaprine (FLEXERIL) 5 MG tablet Take one tablet by mouth every 12 hours as needed for pain 08/24/14  Yes Mahima Bubba Camp, MD  losartan (COZAAR) 50 MG tablet Take one tablet by mouth once daily to control blood pressure 03/02/14  Yes Tiffany L Reed, DO  mometasone (NASONEX) 50 MCG/ACT nasal spray Place 2  sprays into the nose daily as needed (congestion).    Yes Historical Provider, MD  mometasone-formoterol (DULERA) 200-5 MCG/ACT AERO Inhale 2 puffs into the lungs 2 (two) times daily.   Yes Historical Provider, MD  pantoprazole (PROTONIX) 20 MG tablet Take 1 tablet (20 mg total) by mouth daily. 04/04/14  Yes Mahima Pandey, MD  simvastatin (ZOCOR) 20 MG tablet Take 20 mg by mouth at bedtime.   Yes Historical Provider, MD  ciprofloxacin (CILOXAN) 0.3 % ophthalmic solution Administer 1 drop, every 2 hours, while awake, for 2 days. Then 1 drop, every 4 hours, while awake, for the next 5 days. 08/26/14   Richarda Blade, MD  HYDROcodone-acetaminophen (NORCO) 5-325 MG per tablet Take 1 tablet by mouth every 4 (four) hours as needed. 08/26/14   Richarda Blade, MD  predniSONE (DELTASONE) 20 MG tablet Take 1 tablet (20 mg total) by mouth 2 (two) times daily. 08/26/14   Richarda Blade, MD   BP 124/79  Pulse 62  Temp(Src) 97.8 F (36.6 C) (Oral)  Resp 18  SpO2 95% Physical Exam  Nursing note and vitals reviewed. Constitutional: He is oriented to person, place, and time. He appears well-developed and well-nourished.  HENT:  Head: Normocephalic and atraumatic.  Right Ear: External ear normal.  Left Ear: External ear normal.  Eyes: EOM are normal. Pupils are equal, round, and reactive to light. Right conjunctiva is injected. Left conjunctiva is not injected.  Right scleral conjunctiva injected without drainage or swelling.   Neck: Normal range of motion and phonation normal. Neck supple.  Cardiovascular: Normal rate, regular rhythm and normal heart sounds.   Pulmonary/Chest: Effort normal and breath sounds normal. He exhibits no bony tenderness.  Abdominal: Soft. There is no tenderness.  Musculoskeletal: Normal range of motion. He exhibits no tenderness.  SLR negative bilaterally   Neurological: He is alert and oriented to person, place, and time. No cranial nerve deficit or sensory deficit. He exhibits  normal muscle tone. Coordination normal.  Skin: Skin is warm, dry and intact.  Psychiatric: He has a normal mood and affect. His behavior is normal. Judgment and thought content normal.    ED Course  Procedures   DIAGNOSTIC STUDIES: Oxygen Saturation is 100% on RA, normal by my interpretation.    COORDINATION OF CARE: 10:04 AM Discussed treatment plan with pt at bedside and pt agreed to plan.   Labs Review Labs Reviewed - No data to display  Imaging Review No results found.   EKG Interpretation None      MDM  Final diagnoses:  Conjunctivitis of right eye  Sciatica, right    Conjunctivitis, without evidence for corneal abrasion, or defects. Nonspecific recurrent low back pain with sciatica.  Nursing Notes Reviewed/ Care Coordinated Applicable Imaging Reviewed Interpretation of Laboratory Data incorporated into ED treatment  The patient appears reasonably screened and/or stabilized for discharge and I doubt any other medical condition or other [facility] requiring further screening, evaluation, or treatment in the ED at this time prior to discharge.  Plan: Home Medications- Prednisone, Cipro Eye drops, Norco; Home Treatments- no contacts until eye is better,  rest; return here if the recommended treatment, does not improve the symptoms; Recommended follow up- PCP prn  I personally performed the services described in this documentation, which was scribed in my presence. The recorded information has been reviewed and is accurate.      Richarda Blade, MD 08/26/14 [phone]

## 2014-08-26 NOTE — Discharge Instructions (Signed)
Bacterial Conjunctivitis °Bacterial conjunctivitis, commonly called pink eye, is an inflammation of the clear membrane that covers the white part of the eye (conjunctiva). The inflammation can also happen on the underside of the eyelids. The blood vessels in the conjunctiva become inflamed, causing the eye to become red or pink. Bacterial conjunctivitis may spread easily from one eye to another and from person to person (contagious).  °CAUSES  °Bacterial conjunctivitis is caused by bacteria. The bacteria may come from your own skin, your upper respiratory tract, or from someone else with bacterial conjunctivitis. °SYMPTOMS  °The normally white color of the eye or the underside of the eyelid is usually pink or red. The pink eye is usually associated with irritation, tearing, and some sensitivity to light. Bacterial conjunctivitis is often associated with a thick, yellowish discharge from the eye. The discharge may turn into a crust on the eyelids overnight, which causes your eyelids to stick together. If a discharge is present, there may also be some blurred vision in the affected eye. °DIAGNOSIS  °Bacterial conjunctivitis is diagnosed by your caregiver through an eye exam and the symptoms that you report. Your caregiver looks for changes in the surface tissues of your eyes, which may point to the specific type of conjunctivitis. A sample of any discharge may be collected on a cotton-tip swab if you have a severe case of conjunctivitis, if your cornea is affected, or if you keep getting repeat infections that do not respond to treatment. The sample will be sent to a lab to see if the inflammation is caused by a bacterial infection and to see if the infection will respond to antibiotic medicines. °TREATMENT  °· Bacterial conjunctivitis is treated with antibiotics. Antibiotic eyedrops are most often used. However, antibiotic ointments are also available. Antibiotics pills are sometimes used. Artificial tears or eye  washes may ease discomfort. °HOME CARE INSTRUCTIONS  °· To ease discomfort, apply a cool, clean washcloth to your eye for 10-20 minutes, 3-4 times a day. °· Gently wipe away any drainage from your eye with a warm, wet washcloth or a cotton ball. °· Wash your hands often with soap and water. Use paper towels to dry your hands. °· Do not share towels or washcloths. This may spread the infection. °· Change or wash your pillowcase every day. °· You should not use eye makeup until the infection is gone. °· Do not operate machinery or drive if your vision is blurred. °· Stop using contact lenses. Ask your caregiver how to sterilize or replace your contacts before using them again. This depends on the type of contact lenses that you use. °· When applying medicine to the infected eye, do not touch the edge of your eyelid with the eyedrop bottle or ointment tube. °SEEK IMMEDIATE MEDICAL CARE IF:  °· Your infection has not improved within 3 days after beginning treatment. °· You had yellow discharge from your eye and it returns. °· You have increased eye pain. °· Your eye redness is spreading. °· Your vision becomes blurred. °· You have a fever or persistent symptoms for more than 2-3 days. °· You have a fever and your symptoms suddenly get worse. °· You have facial pain, redness, or swelling. °MAKE SURE YOU:  °· Understand these instructions. °· Will watch your condition. °· Will get help right away if you are not doing well or get worse. °Document Released: 11/10/2005 Document Revised: 03/27/2014 Document Reviewed: 04/12/2012 °[employer]® Patient Information ©2015 [employer]. This information is not intended to   replace advice given to you by your health care provider. Make sure you discuss any questions you have with your health care provider.  Sciatica Sciatica is pain, weakness, numbness, or tingling along the path of the sciatic nerve. The nerve starts in the lower back and runs down the back of each leg. The nerve  controls the muscles in the lower leg and in the back of the knee, while also providing sensation to the back of the thigh, lower leg, and the sole of your foot. Sciatica is a symptom of another medical condition. For instance, nerve damage or certain conditions, such as a herniated disk or bone spur on the spine, pinch or put pressure on the sciatic nerve. This causes the pain, weakness, or other sensations normally associated with sciatica. Generally, sciatica only affects one side of the body. CAUSES   Herniated or slipped disc.  Degenerative disk disease.  A pain disorder involving the narrow muscle in the buttocks (piriformis syndrome).  Pelvic injury or fracture.  Pregnancy.  Tumor (rare). SYMPTOMS  Symptoms can vary from mild to very severe. The symptoms usually travel from the low back to the buttocks and down the back of the leg. Symptoms can include:  Mild tingling or dull aches in the lower back, leg, or hip.  Numbness in the back of the calf or sole of the foot.  Burning sensations in the lower back, leg, or hip.  Sharp pains in the lower back, leg, or hip.  Leg weakness.  Severe back pain inhibiting movement. These symptoms may get worse with coughing, sneezing, laughing, or prolonged sitting or standing. Also, being overweight may worsen symptoms. DIAGNOSIS  Your caregiver will perform a physical exam to look for common symptoms of sciatica. He or she may ask you to do certain movements or activities that would trigger sciatic nerve pain. Other tests may be performed to find the cause of the sciatica. These may include:  Blood tests.  X-rays.  Imaging tests, such as an MRI or CT scan. TREATMENT  Treatment is directed at the cause of the sciatic pain. Sometimes, treatment is not necessary and the pain and discomfort goes away on its own. If treatment is needed, your caregiver may suggest:  Over-the-counter medicines to relieve pain.  Prescription medicines, such  as anti-inflammatory medicine, muscle relaxants, or narcotics.  Applying heat or ice to the painful area.  Steroid injections to lessen pain, irritation, and inflammation around the nerve.  Reducing activity during periods of pain.  Exercising and stretching to strengthen your abdomen and improve flexibility of your spine. Your caregiver may suggest losing weight if the extra weight makes the back pain worse.  Physical therapy.  Surgery to eliminate what is pressing or pinching the nerve, such as a bone spur or part of a herniated disk. HOME CARE INSTRUCTIONS   Only take over-the-counter or prescription medicines for pain or discomfort as directed by your caregiver.  Apply ice to the affected area for 20 minutes, 3-4 times a day for the first 48-72 hours. Then try heat in the same way.  Exercise, stretch, or perform your usual activities if these do not aggravate your pain.  Attend physical therapy sessions as directed by your caregiver.  Keep all follow-up appointments as directed by your caregiver.  Do not wear high heels or shoes that do not provide proper support.  Check your mattress to see if it is too soft. A firm mattress may lessen your pain and discomfort. SEEK IMMEDIATE  MEDICAL CARE IF:   You lose control of your bowel or bladder (incontinence).  You have increasing weakness in the lower back, pelvis, buttocks, or legs.  You have redness or swelling of your back.  You have a burning sensation when you urinate.  You have pain that gets worse when you lie down or awakens you at night.  Your pain is worse than you have experienced in the past.  Your pain is lasting longer than 4 weeks.  You are suddenly losing weight without reason. MAKE SURE YOU:  Understand these instructions.  Will watch your condition.  Will get help right away if you are not doing well or get worse. Document Released: 11/04/2001 Document Revised: 05/11/2012 Document Reviewed:  03/21/2012 [facility] Patient Information 2015 [place], [place]. This information is not intended to replace advice given to you by your health care provider. Make sure you discuss any questions you have with your health care provider.

## 2014-08-26 NOTE — ED Notes (Signed)
MD at bedside. 

## 2014-08-26 NOTE — ED Notes (Signed)
Pt states right eye has been red since took contact out yesterday. Pt has redness to sclera/mild swelling around eye noted. pt has sunglasses on due to pain. Pt c/o lower back pain x 1 week that is now radiating to right side more. Increased urinary output x 1 week. Hx of AAA

## 2014-09-27 ENCOUNTER — Telehealth: Payer: Self-pay | Admitting: *Deleted

## 2014-09-27 ENCOUNTER — Other Ambulatory Visit: Payer: Self-pay | Admitting: Internal Medicine

## 2014-09-27 NOTE — Telephone Encounter (Signed)
Ok to call zpack for 5 days- 500 mg day 1 and 250 daily for next 4 days

## 2014-09-27 NOTE — Telephone Encounter (Signed)
Patient called and stated that he has a cough and congestion. His mucus is tanish/yellowish. No fever. Patient just had a grand baby today and there are no appointments to be seen. A Z-pac has cleared it up in the past, can this be called in? Please Advise.

## 2014-09-28 MED ORDER — AZITHROMYCIN 250 MG PO TABS: ORAL_TABLET | ORAL | Status: DC

## 2014-09-28 NOTE — Telephone Encounter (Signed)
Patient Notified and Rx called into pharmacy

## 2014-10-02 ENCOUNTER — Telehealth: Payer: Self-pay | Admitting: *Deleted

## 2014-10-02 ENCOUNTER — Other Ambulatory Visit: Payer: Self-pay | Admitting: Internal Medicine

## 2014-10-02 NOTE — Telephone Encounter (Signed)
Patient to fax a [employer] form for insurance to [phone]. Once filled out, fax to insurance company and mail to patient.

## 2014-10-02 NOTE — Telephone Encounter (Signed)
Received [employer] Form and filled out for Dr. Bubba Camp to review and sign. To be faxed back to [facility] # [phone]. Patient wants a copy mailed to them once completed.

## 2014-10-03 ENCOUNTER — Other Ambulatory Visit: Payer: Self-pay | Admitting: *Deleted

## 2014-10-03 MED ORDER — LOSARTAN POTASSIUM 50 MG PO TABS: ORAL_TABLET | ORAL | Status: DC

## 2014-10-03 NOTE — Telephone Encounter (Signed)
Faxed forms back today and mailed copy to patient. Patient notified.

## 2014-10-03 NOTE — Telephone Encounter (Signed)
Patient Requested and faxed to pharmacy. 

## 2014-10-30 ENCOUNTER — Encounter: Payer: Self-pay | Admitting: Vascular Surgery

## 2014-10-31 ENCOUNTER — Ambulatory Visit (HOSPITAL_COMMUNITY)
Admission: RE | Admit: 2014-10-31 | Discharge: 2014-10-31 | Disposition: A | Discharge status: Home / Self Care | Payer: Managed Care, Other (non HMO) | Source: Ambulatory Visit | Attending: Vascular Surgery | Admitting: Vascular Surgery

## 2014-10-31 ENCOUNTER — Ambulatory Visit (INDEPENDENT_AMBULATORY_CARE_PROVIDER_SITE_OTHER): Payer: Managed Care, Other (non HMO) | Admitting: Vascular Surgery

## 2014-10-31 ENCOUNTER — Encounter: Payer: Self-pay | Admitting: Vascular Surgery

## 2014-10-31 VITALS — BP 129/80 | HR 62 | Ht 74.0 in | Wt 240.0 lb

## 2014-10-31 DIAGNOSIS — Z48812 Encounter for surgical aftercare following surgery on the circulatory system: Secondary | ICD-10-CM

## 2014-10-31 DIAGNOSIS — I714 Abdominal aortic aneurysm, without rupture, unspecified: Secondary | ICD-10-CM

## 2014-10-31 NOTE — Progress Notes (Signed)
Subjective:     Patient ID: Aaron [name], male   DOB: 01/19/1956, 58 y.o.   MRN: [id number]  HPI this 58 year old male returns 2 years post abdominal aortic aneurysm repair using endovascular stent graft. He has no specific complaints today. He is able to ambulate up to 1 mile without difficulty. Prior to his surgery he had numbness in the anterior thighs when standing although that has completely resolved since his aneurysm was repaired 2 years ago.  Past Medical History  Diagnosis Date  . Hyperlipidemia   . Gout   . Rash 2011    left chest, biopsy 2012 Dr. Rozann Lesches, results pending  . Dyspnea     normal PFT April 2012  . Tobacco abuse   . Pneumonia     hx of and hx of bronchitis  . GERD (gastroesophageal reflux disease)   . H/O hiatal hernia   . Arthritis     lower back  . Asthma     followed by Dr. Berdie Ogren  . HTN (hypertension)     hx of sees Dr. Graylin Shiver  . Abdominal aortic aneurysm     sees Dr. Einar Gip for cardiac f/u, [phone]  . Other abnormal blood chemistry   . Insomnia, unspecified   . External hemorrhoids without mention of complication   . Other testicular dysfunction   . Other specified erythematous condition(695.89)   . Other dyspnea and respiratory abnormality   . Prostatitis, unspecified   . Other malaise and fatigue   . Chronic airway obstruction, not elsewhere classified     History  Substance Use Topics  . Smoking status: Former Smoker -- 0.20 packs/day for 52.5 years    Types: Cigarettes    Quit date: 09/02/2012  . Smokeless tobacco: Current User    Types: Snuff  . Alcohol Use: No     Comment: past ETOH use quit 11-26-2009    Family History  Problem Relation Age of Onset  . Heart attack Father 63  . COPD Father   . Heart disease Father   . Asthma Daughter   . Cancer Mother     Breast  . Lupus Daughter   . Diabetes Other     Grandson     No Known Allergies  Current outpatient prescriptions: albuterol (PROVENTIL HFA;VENTOLIN HFA)  108 (90 BASE) MCG/ACT inhaler, Inhale 2 puffs into the lungs every 6 (six) hours as needed. For cough and wheezing., Disp: 8.5 g, Rfl: 5;  aspirin 81 MG tablet, Take 81 mg by mouth daily., Disp: , Rfl: ;  losartan (COZAAR) 50 MG tablet, Take one tablet by mouth once daily to control blood pressure, Disp: 30 tablet, Rfl: 6 mometasone (NASONEX) 50 MCG/ACT nasal spray, Place 2 sprays into the nose daily as needed (congestion). , Disp: , Rfl: ;  mometasone-formoterol (DULERA) 200-5 MCG/ACT AERO, Inhale 2 puffs into the lungs 2 (two) times daily., Disp: , Rfl: ;  pantoprazole (PROTONIX) 20 MG tablet, TAKE ONE TABLET ONCE DAILY, Disp: 30 tablet, Rfl: 4;  simvastatin (ZOCOR) 20 MG tablet, Take 20 mg by mouth at bedtime., Disp: , Rfl:  azithromycin (ZITHROMAX) 250 MG tablet, Take two tablets on day 1 and One tablet for the next 4 days for infection (Patient not taking: Reported on 10/31/2014), Disp: 6 each, Rfl: 0;  ciprofloxacin (CILOXAN) 0.3 % ophthalmic solution, Administer 1 drop, every 2 hours, while awake, for 2 days. Then 1 drop, every 4 hours, while awake, for the next 5 days. (Patient not taking: Reported on 10/31/2014), Disp:  5 mL, Rfl: 0 cyclobenzaprine (FLEXERIL) 5 MG tablet, Take one tablet by mouth every 12 hours as needed for pain (Patient not taking: Reported on 10/31/2014), Disp: 60 tablet, Rfl: 1;  HYDROcodone-acetaminophen (NORCO) 5-325 MG per tablet, Take 1 tablet by mouth every 4 (four) hours as needed. (Patient not taking: Reported on 10/31/2014), Disp: 20 tablet, Rfl: 0 predniSONE (DELTASONE) 20 MG tablet, Take 1 tablet (20 mg total) by mouth 2 (two) times daily. (Patient not taking: Reported on 10/31/2014), Disp: 10 tablet, Rfl: 0  BP 129/80 mmHg  Pulse 62  Ht 6\' 2"  (1.88 m)  Wt 240 lb (108.863 kg)  BMI 30.80 kg/m2  SpO2 96%  Body mass index is 30.8 kg/(m^2).           Review of Systems denies chest pain, dyspnea on exertion, PND, orthopnea, hemoptysis, claudication, lateralizing  weakness, aphasia. All systems negative and complete review of systems     Objective:   Physical Exam BP 129/80 mmHg  Pulse 62  Ht 6\' 2"  (1.88 m)  Wt 240 lb (108.863 kg)  BMI 30.80 kg/m2  SpO2 96%  Gen.-alert and oriented x3 in no apparent distress HEENT normal for age Lungs no rhonchi or wheezing Cardiovascular regular rhythm no murmurs carotid pulses 3+ palpable no bruits audible Abdomen soft nontender no palpable masses-obese  Musculoskeletal free of  major deformities Skin clear -no rashes Neurologic normal Lower extremities 3+ femoral and posterior tibial pulses palpable bilaterally with no edema  Today I ordered a duplex scan of the abdominal aorta and stent graft which I reviewed and interpreted. The diameter of the sac has diminished further to 3.5 cm in maximum diameter. There is no evidence of Endo leak. Graft is in excellent position.       Assessment:     Doing well 2 years post endovascular stent graft repair of abdominal aortic aneurysm with contraction of sac to 3.5 cm maximum diameter (5.1 cm preoperatively)    Plan:     Return in one year. Will obtain 1 more CT angiogram next year to further evaluate this and then follow patient with annual duplex scanning

## 2014-10-31 NOTE — Addendum Note (Signed)
Addended by: Dorthula Rue L on: 10/31/2014 10:12 AM   Modules accepted: Orders

## 2014-11-03 ENCOUNTER — Other Ambulatory Visit: Payer: Self-pay | Admitting: Physician Assistant

## 2014-11-03 DIAGNOSIS — C4492 Squamous cell carcinoma of skin, unspecified: Secondary | ICD-10-CM

## 2014-11-03 HISTORY — DX: Squamous cell carcinoma of skin, unspecified: C44.92

## 2014-12-08 ENCOUNTER — Other Ambulatory Visit: Payer: 59

## 2014-12-08 DIAGNOSIS — N529 Male erectile dysfunction, unspecified: Secondary | ICD-10-CM

## 2014-12-08 DIAGNOSIS — I10 Essential (primary) hypertension: Secondary | ICD-10-CM

## 2014-12-08 DIAGNOSIS — J4540 Moderate persistent asthma, uncomplicated: Secondary | ICD-10-CM

## 2014-12-08 DIAGNOSIS — E785 Hyperlipidemia, unspecified: Secondary | ICD-10-CM

## 2014-12-12 ENCOUNTER — Telehealth: Payer: Self-pay | Admitting: *Deleted

## 2014-12-12 ENCOUNTER — Ambulatory Visit (INDEPENDENT_AMBULATORY_CARE_PROVIDER_SITE_OTHER): Payer: 59 | Admitting: Internal Medicine

## 2014-12-12 VITALS — BP 126/78 | HR 93 | Temp 98.2°F | Wt 241.0 lb

## 2014-12-12 DIAGNOSIS — K219 Gastro-esophageal reflux disease without esophagitis: Secondary | ICD-10-CM

## 2014-12-12 DIAGNOSIS — R7309 Other abnormal glucose: Secondary | ICD-10-CM

## 2014-12-12 DIAGNOSIS — Z23 Encounter for immunization: Secondary | ICD-10-CM

## 2014-12-12 DIAGNOSIS — N529 Male erectile dysfunction, unspecified: Secondary | ICD-10-CM | POA: Insufficient documentation

## 2014-12-12 DIAGNOSIS — E785 Hyperlipidemia, unspecified: Secondary | ICD-10-CM

## 2014-12-12 DIAGNOSIS — I440 Atrioventricular block, first degree: Secondary | ICD-10-CM

## 2014-12-12 DIAGNOSIS — J4540 Moderate persistent asthma, uncomplicated: Secondary | ICD-10-CM

## 2014-12-12 DIAGNOSIS — I719 Aortic aneurysm of unspecified site, without rupture: Secondary | ICD-10-CM

## 2014-12-12 DIAGNOSIS — Z0000 Encounter for general adult medical examination without abnormal findings: Secondary | ICD-10-CM

## 2014-12-12 DIAGNOSIS — R7303 Prediabetes: Secondary | ICD-10-CM

## 2014-12-12 DIAGNOSIS — I10 Essential (primary) hypertension: Secondary | ICD-10-CM

## 2014-12-12 DIAGNOSIS — R918 Other nonspecific abnormal finding of lung field: Secondary | ICD-10-CM

## 2014-12-12 MED ORDER — NIACIN ER 250 MG PO CPCR: 250.0000 mg | ORAL_CAPSULE | Freq: Every day | ORAL | Status: DC

## 2014-12-12 MED ORDER — PANTOPRAZOLE SODIUM 20 MG PO TBEC: 20.0000 mg | DELAYED_RELEASE_TABLET | Freq: Every day | ORAL | Status: DC

## 2014-12-12 MED ORDER — ALBUTEROL SULFATE HFA 108 (90 BASE) MCG/ACT IN AERS: 2.0000 | INHALATION_SPRAY | Freq: Four times a day (QID) | RESPIRATORY_TRACT | Status: DC | PRN

## 2014-12-12 MED ORDER — NIACIN 100 MG PO TABS: 100.0000 mg | ORAL_TABLET | Freq: Every day | ORAL | Status: DC

## 2014-12-12 MED ORDER — MOMETASONE FUROATE 50 MCG/ACT NA SUSP: 2.0000 | Freq: Every day | NASAL | Status: DC | PRN

## 2014-12-12 MED ORDER — LOSARTAN POTASSIUM 50 MG PO TABS: ORAL_TABLET | ORAL | Status: DC

## 2014-12-12 MED ORDER — SIMVASTATIN 20 MG PO TABS: 20.0000 mg | ORAL_TABLET | Freq: Every day | ORAL | Status: DC

## 2014-12-12 MED ORDER — PNEUMOCOCCAL 13-VAL CONJ VACC IM SUSP
0.5000 mL | Freq: Once | INTRAMUSCULAR | Status: AC
  Administered 2014-12-12: 0.5 mL via INTRAMUSCULAR

## 2014-12-12 MED ORDER — SILDENAFIL CITRATE 50 MG PO TABS: 50.0000 mg | ORAL_TABLET | Freq: Every day | ORAL | Status: DC | PRN

## 2014-12-12 NOTE — Telephone Encounter (Signed)
Jonni Sanger with [facility] called and stated that the Nasonex is $240.00 and patient wants it switched to something different. Please Advise.

## 2014-12-12 NOTE — Telephone Encounter (Signed)
We can d/c nasonex and have him on flonase 1 spray daily as needed

## 2014-12-12 NOTE — Progress Notes (Signed)
Patient ID: Aaron [name], male   DOB: 12-16-1955, 59 y.o.   MRN: [id number]    Chief Complaint  Patient presents with  . Annual Exam   No Known Allergies  HPI 59 y/o male pt is here for his annual visit.  Reviewed his blood work with the patient Last colonoscopy in 2010 was normal, next due on 2020 uptodate with his influenza vaccine and pneumococcal 23 Compliant with his medications Complaints of having erectile dysfunction- had resolved in between. He mentions viagra has been helpful in past, he was seen by urology Dr Jeffie Pollock in past- no note available for review and prescribed this. He has hx of prediabetes and AAA with HTN and smoking. He has been folowed by vascular team and post aneurysm repair, his aneurysm size has decreased and recent ct angiogram was normal.  Notes reviewed Reflux symptom and bp under control Breathing has been stable  Review of Systems  Constitutional: Negative for fever, chills, weight loss, malaise/fatigue and diaphoresis.  HENT: Negative for congestion, hearing loss and sore throat.   Eyes: Negative for blurred vision, double vision and discharge. Wears glasses. Last saw his eye doctor a year back. Respiratory: Negative for cough, sputum production, shortness of breath and wheezing. no further smoking. Has hx of asthma. pending appointment with pulmonary in April 2016 Cardiovascular: Negative for chest pain, palpitations, orthopnea and leg swelling.  Gastrointestinal: Negative for heartburn, nausea, vomiting, abdominal pain, diarrhea and constipation. Taking PPI. Denies rectal bleed or melena. Genitourinary: Negative for dysuria, urgency, frequency, hematuria and flank pain. PSA normal Musculoskeletal: Negative for back pain, falls, joint pain and myalgias.  Skin: Negative for itching and rash.  Neurological: Negative for dizziness, tingling, focal weakness and headaches.  Psychiatric/Behavioral: Negative for depression and memory loss. The patient is not  nervous/anxious.      Past Medical History  Diagnosis Date  . Hyperlipidemia   . Gout   . Rash 2011    left chest, biopsy 2012 Dr. Rozann Lesches, results pending  . Dyspnea     normal PFT April 2012  . Tobacco abuse   . Pneumonia     hx of and hx of bronchitis  . GERD (gastroesophageal reflux disease)   . H/O hiatal hernia   . Arthritis     lower back  . Asthma     followed by Dr. Berdie Ogren  . HTN (hypertension)     hx of sees Dr. Graylin Shiver  . Abdominal aortic aneurysm     sees Dr. Einar Gip for cardiac f/u, [phone]  . Other abnormal blood chemistry   . Insomnia, unspecified   . External hemorrhoids without mention of complication   . Other testicular dysfunction   . Other specified erythematous condition(695.89)   . Other dyspnea and respiratory abnormality   . Prostatitis, unspecified   . Other malaise and fatigue   . Chronic airway obstruction, not elsewhere classified    Past Surgical History  Procedure Laterality Date  . Appendectomy    . Tonsillectomy    . Wrist surgery      left  . Toe surgery  01/2012    joint of left great toe  . Tympanoplasty    . Abdominal aortic endovascular stent graft  09/22/2012    Procedure: ABDOMINAL AORTIC ENDOVASCULAR STENT GRAFT;  Surgeon: Mal Misty, MD;  Location: [facility];  Service: Vascular;  Laterality: N/A;  GORE; ultrasound guided.   Immunization History  Administered Date(s) Administered  . Influenza Split 09/20/2012  . Influenza,inj,Quad PF,36+ Mos  09/29/2013, 08/08/2014  . Pneumococcal Polysaccharide-23 06/30/2011  . Tdap 12/06/2013   Family History  Problem Relation Age of Onset  . Heart attack Father 20  . COPD Father   . Heart disease Father   . Asthma Daughter   . Cancer Mother     Breast  . Lupus Daughter   . Diabetes Other     Grandson    Physical exam BP 126/78 mmHg  Pulse 93  Temp(Src) 98.2 F (36.8 C) (Oral)  Wt 241 lb (109.317 kg)  SpO2 97%  Wt Readings from Last 3 Encounters:  12/12/14  241 lb (109.317 kg)  10/31/14 240 lb (108.863 kg)  08/08/14 235 lb (106.595 kg)   General- adult male in no acute distress, overweight  Head- atraumatic, normocephalic Eyes- PERRLA, EOMI, no pallor, no icterus Ears- normal ear canal and tympanic membrane Nose- normal nasal mucosa, no sinus tenderness Mouth- moist mucus membrane, no thrush, normal oropharynx Neck- no lymphadenopathy, no thyromegaly, no jugular vein distension, no carotid bruit Chest- no chest wall deformities, no chest wall tenderness Cardiovascular- normal s1,s2, no murmurs/ rubs/ gallops, no leg edema, good radial and dorsalis pedis pulses Respiratory- bilateral clear to auscultation, no wheeze, no rhonchi, no crackles Abdomen- bowel sounds present, soft, non tender Genitalia- bilateral scrotal and penile exam normal, normal rectal tone, no palpable rectal mass Musculoskeletal- able to move all 4 extremities, no spinal and paraspinal tenderness, steady gait, no use of assistive device Neurological- no focal deficit, normal reflexes, normal muscle strength, normal sensation to fine touch and vibration Psychiatry- alert and oriented to person, place and time, normal mood and affect  Labs CBC Latest Ref Rng 12/08/2014 12/02/2013 08/22/2013  WBC 3.4 - 10.8 x10E3/uL 5.4 6.0 6.0  Hemoglobin 12.6 - 17.7 g/dL 14.8 14.2 13.7  Hematocrit 37.5 - 51.0 % 44.9 41.5 40.4  Platelets 150 - 400 K/uL - - 227    CMP Latest Ref Rng 08/08/2014 04/04/2014 12/02/2013  Glucose 65 - 99 mg/dL 95 - 105(H)  BUN 6 - 24 mg/dL 11 - 11  Creatinine 0.76 - 1.27 mg/dL 1.04 - 0.99  Sodium 134 - 144 mmol/L 145(H) - 141  Potassium 3.5 - 5.2 mmol/L 4.8 - 4.5  Chloride 97 - 108 mmol/L 102 - 103  CO2 18 - 29 mmol/L 21 - 25  Calcium 8.7 - 10.2 mg/dL 9.5 - 9.6  Total Protein 6.0 - 8.5 g/dL 7.1 6.9 7.0  Albumin 3.5 - 5.5 g/dL 4.6 4.4 4.4  Total Bilirubin 0.0 - 1.2 mg/dL 0.3 0.3 0.6  Alkaline Phos 39 - 117 IU/L 90 93 96  AST 0 - 40 IU/L 24 27 21   ALT 0 - 44  IU/L 28 30 25     Lipid Panel     Component Value Date/Time   TRIG 74 12/08/2014 0806   HDL 38* 12/08/2014 0806   CHOLHDL 3.2 12/08/2014 0806   LDLCALC 70 12/08/2014 0806   Lab Results  Component Value Date   PSA 0.3 12/08/2014   Lab Results  Component Value Date   HGBA1C 5.9* 12/02/2013   12/12/14 ekg- NSR,  62 bpm, PR 208 unchanged from before, QTc 402, T waves flattening in lateral leads (low voltage ekg)   09/17/12 stress test- EF 62%, no ischemia noted.   Assessment/plan  1. Prediabetes Recheck a1c, dietary counselling provided, weight loss encouraged - Hemoglobin A1c - TSH  2. Aneurysm of aorta S/p repair, stable, reviewed notes, continue asa and statin  3. Essential hypertension Stable, continue cozaar  and aspirin.   4. Asthma, moderate persistent, uncomplicated Continue prn albuterol with dulera, sees pulmonary  5. Gastroesophageal reflux disease, esophagitis presence not specified Stable, continue protonix  6. Hyperlipidemia With low hdl, add niacin 250 mg daily and continue simvastatin current dosing  7. Lung nodule, multiple Followed by pulmonary  8. Routine general medical examination at a health care facility the patient was counseled regarding prevention of dental and periodontal disease, diet, regular sustained exercise for at least 30 minutes 5 times per week, the proper use of sunscreen and protective clothing, tobacco use, and recommended schedule for GI hemoccult testing, colonoscopy, cholesterol, thyroid and diabetes screening.  9. Erectile dysfunction, unspecified erectile dysfunction type viagra 50 mg daily prn provided for now. Normal prostate exam. Assess for diabetes again  10. First degree heart block Noted on ekg and seen on prior ekg as well. Asymptomatic. Monitor clinically

## 2014-12-13 ENCOUNTER — Other Ambulatory Visit: Payer: Self-pay | Admitting: *Deleted

## 2014-12-13 ENCOUNTER — Encounter: Payer: Self-pay | Admitting: *Deleted

## 2014-12-13 LAB — CBC WITH DIFFERENTIAL/PLATELET
Basophils Absolute: 0.1 10*3/uL (ref 0.0–0.2)
Basos: 1 %
Eos: 2 %
Eosinophils Absolute: 0.1 10*3/uL (ref 0.0–0.4)
HEMATOCRIT: 44.9 % (ref 37.5–51.0)
HEMOGLOBIN: 14.8 g/dL (ref 12.6–17.7)
IMMATURE GRANULOCYTES: 0 %
Immature Grans (Abs): 0 10*3/uL (ref 0.0–0.1)
LYMPHS ABS: 1.6 10*3/uL (ref 0.7–3.1)
Lymphs: 29 %
MCH: 30.3 pg (ref 26.6–33.0)
MCHC: 33 g/dL (ref 31.5–35.7)
MCV: 92 fL (ref 79–97)
MONOCYTES: 9 %
Monocytes Absolute: 0.5 10*3/uL (ref 0.1–0.9)
NEUTROS ABS: 3.2 10*3/uL (ref 1.4–7.0)
NEUTROS PCT: 59 %
RBC: 4.89 x10E6/uL (ref 4.14–5.80)
RDW: 13.7 % (ref 12.3–15.4)
WBC: 5.4 10*3/uL (ref 3.4–10.8)

## 2014-12-13 LAB — HEMOGLOBIN A1C
ESTIMATED AVERAGE GLUCOSE: 123 mg/dL
HEMOGLOBIN A1C: 5.9 % — AB (ref 4.8–5.6)

## 2014-12-13 LAB — LIPID PANEL
Chol/HDL Ratio: 3.2 ratio units (ref 0.0–5.0)
Cholesterol, Total: 123 mg/dL (ref 100–199)
HDL: 38 mg/dL — ABNORMAL LOW (ref >39)
LDL Calculated: 70 mg/dL (ref 0–99)
TRIGLYCERIDES: 74 mg/dL (ref 0–149)
VLDL Cholesterol Cal: 15 mg/dL (ref 5–40)

## 2014-12-13 LAB — TSH: TSH: 1.52 u[IU]/mL (ref 0.450–4.500)

## 2014-12-13 LAB — CREATINE

## 2014-12-13 LAB — PSA: PSA: 0.3 ng/mL (ref 0.0–4.0)

## 2014-12-13 MED ORDER — FLUTICASONE PROPIONATE 50 MCG/ACT NA SUSP: 1.0000 | Freq: Every day | NASAL | Status: DC | PRN

## 2014-12-14 NOTE — Telephone Encounter (Signed)
Pharmacy notified and medication called into pharmacy

## 2014-12-15 ENCOUNTER — Encounter: Payer: Self-pay | Admitting: Internal Medicine

## 2014-12-19 ENCOUNTER — Encounter: Payer: Self-pay | Admitting: Internal Medicine

## 2014-12-22 NOTE — Addendum Note (Signed)
Addended by: Eilene Ghazi on: 12/22/2014 03:57 PM   Modules accepted: Orders

## 2014-12-26 ENCOUNTER — Encounter: Payer: Self-pay | Admitting: Internal Medicine

## 2015-01-11 ENCOUNTER — Telehealth: Payer: Self-pay | Admitting: *Deleted

## 2015-01-11 NOTE — Telephone Encounter (Signed)
Patient called and stated that he was having LBP. He did state that he has started exercising and it has gotten better. Will call if worsens and we will bring him in to be seen. He agreed.

## 2015-01-16 ENCOUNTER — Encounter: Payer: Self-pay | Admitting: Internal Medicine

## 2015-02-21 ENCOUNTER — Other Ambulatory Visit: Payer: Self-pay

## 2015-02-21 MED ORDER — SIMVASTATIN 20 MG PO TABS: 20.0000 mg | ORAL_TABLET | Freq: Every day | ORAL | Status: DC

## 2015-02-21 MED ORDER — LOSARTAN POTASSIUM 50 MG PO TABS: ORAL_TABLET | ORAL | Status: DC

## 2015-02-21 MED ORDER — FLUTICASONE PROPIONATE 50 MCG/ACT NA SUSP: 1.0000 | Freq: Every day | NASAL | Status: DC | PRN

## 2015-02-21 MED ORDER — MOMETASONE FURO-FORMOTEROL FUM 200-5 MCG/ACT IN AERO: 2.0000 | INHALATION_SPRAY | Freq: Two times a day (BID) | RESPIRATORY_TRACT | Status: DC

## 2015-02-21 MED ORDER — PANTOPRAZOLE SODIUM 20 MG PO TBEC
20.0000 mg | DELAYED_RELEASE_TABLET | Freq: Every day | ORAL | Status: DC
Start: 2015-02-21 — End: 2015-05-29

## 2015-02-21 MED ORDER — ALBUTEROL SULFATE HFA 108 (90 BASE) MCG/ACT IN AERS: 2.0000 | INHALATION_SPRAY | Freq: Four times a day (QID) | RESPIRATORY_TRACT | Status: DC | PRN

## 2015-03-08 ENCOUNTER — Telehealth: Payer: Self-pay | Admitting: *Deleted

## 2015-03-08 NOTE — Telephone Encounter (Signed)
Patient called and stated that his underarms are Sore. His wife wants him to be seen but patient thinks they are ok. Patient has had new responsibilities at work and working more. Informed patient to try to take it easy on lifting and using his arms to lift heavy stuff. Told him that if this worsens or does not get better he does need to be seen. Patient agreed and  Patient will call on Monday if no better.

## 2015-03-09 ENCOUNTER — Telehealth: Payer: Self-pay

## 2015-03-09 NOTE — Telephone Encounter (Signed)
Wife called asking why the [facility] strength was changed from 100-5 to 200-5 on last refill? Reviewed patients chart, medication list, x-ray, labs with Chrae. We do not know why it was changed. Asked her if the Pulmonary doctor might had changed it. She would call them and call me back. Dr. Dillard Essex has not changed it. The pharmacist suggest he could just do Duler 200-5  one puff twice a day, (Duler 100-5 was 2 puffs twice a day). Asked Dr. Mariea Clonts, that would be ok. The wife was happy with this, this would save them money.

## 2015-03-12 ENCOUNTER — Ambulatory Visit (INDEPENDENT_AMBULATORY_CARE_PROVIDER_SITE_OTHER)
Admission: RE | Admit: 2015-03-12 | Discharge: 2015-03-12 | Disposition: A | Discharge status: Home / Self Care | Payer: Managed Care, Other (non HMO) | Source: Ambulatory Visit | Attending: Internal Medicine | Admitting: Internal Medicine

## 2015-03-12 DIAGNOSIS — R918 Other nonspecific abnormal finding of lung field: Secondary | ICD-10-CM

## 2015-03-12 IMAGING — CT CT CHEST W/O CM
2 of 3 series · 15 of 36 positions shown, 18 images · non-contrast
Comparison: [DATE], [DATE]

CLINICAL DATA: Followup pulmonary nodules

EXAM:
CT CHEST WITHOUT CONTRAST
TECHNIQUE: Multidetector CT imaging of the chest was performed following the
standard protocol without IV contrast..

[Series 2: chest routine with · axial · 0.74mm/px · z∈[-322,-48]mm · 12 of 65 slices shown, 15 images]
[im 5/65  mediastinal]
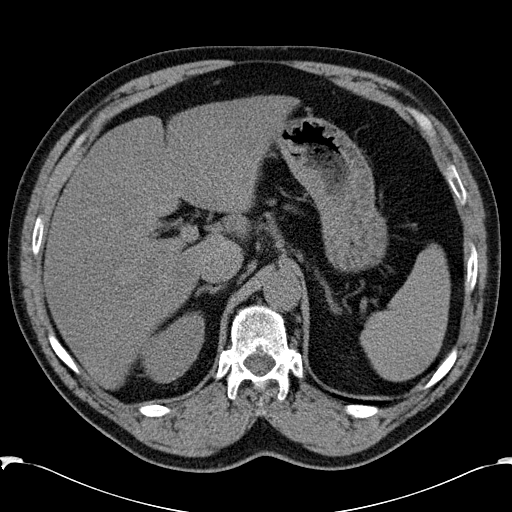
[im 5/65  lung]
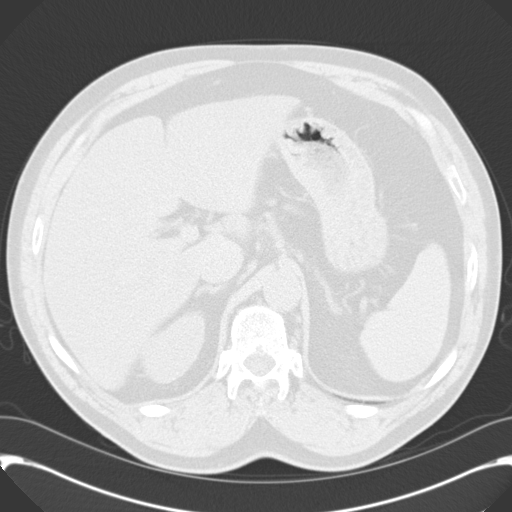
[im 10/65  lung]
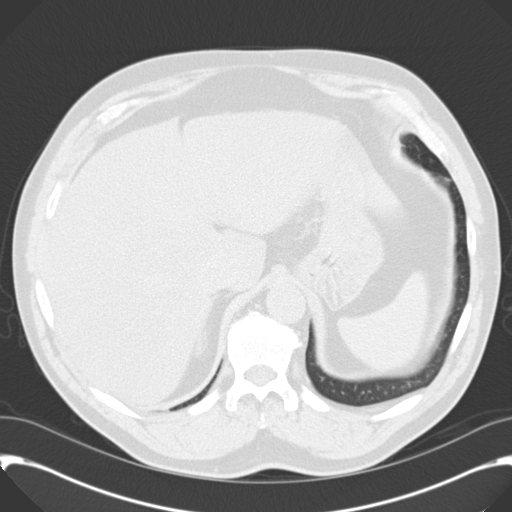
[im 15/65  lung]
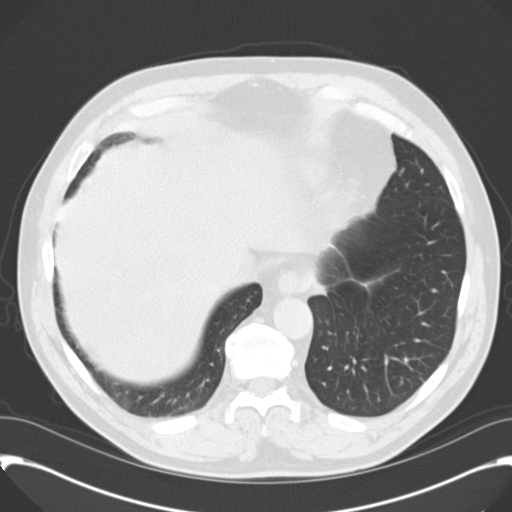
[im 19/65  lung]
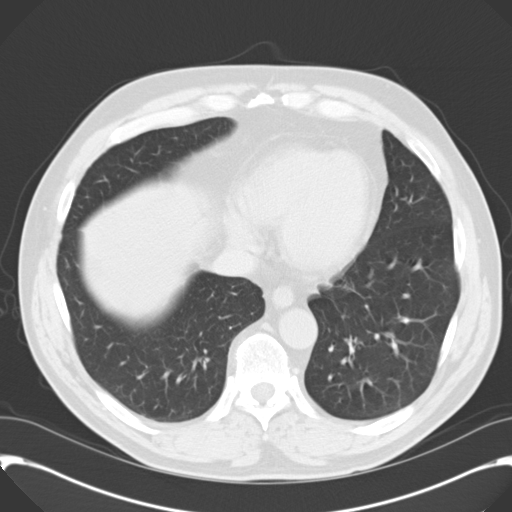
[im 24/65  mediastinal]
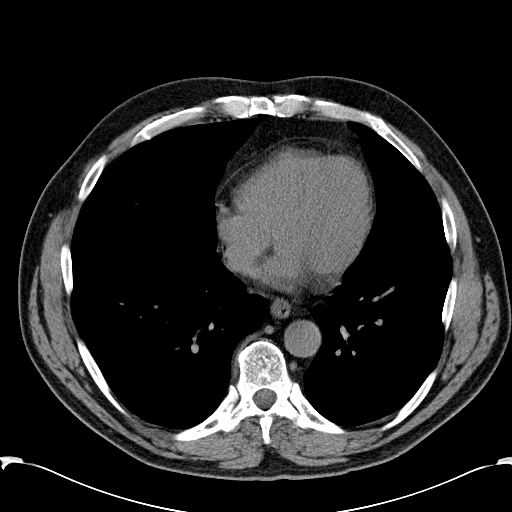
[im 24/65  lung]
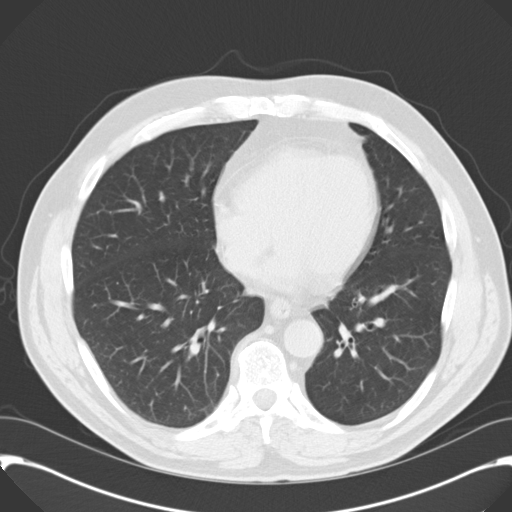
[im 29/65  lung]
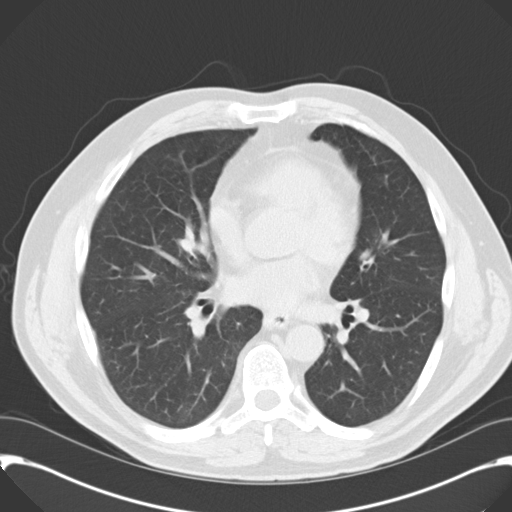
[im 36/65  lung]
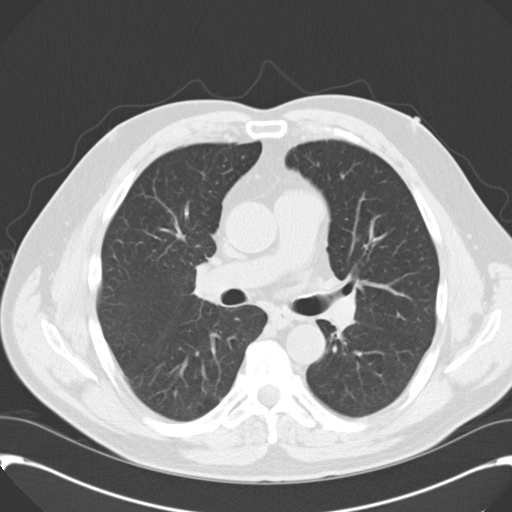
[im 41/65  lung]
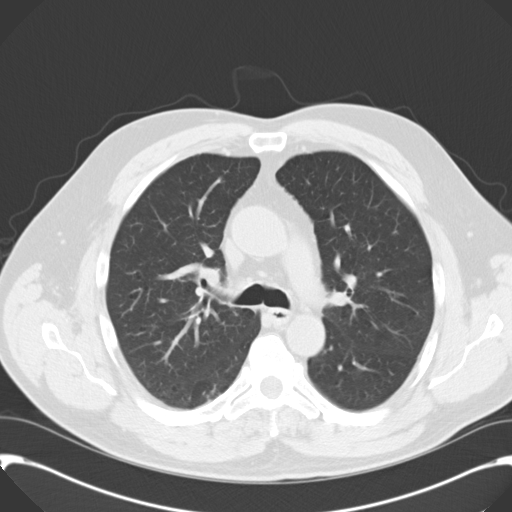
[im 46/65  mediastinal]
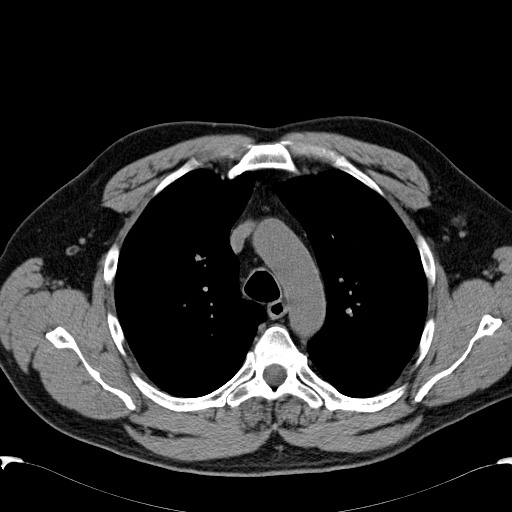
[im 46/65  lung]
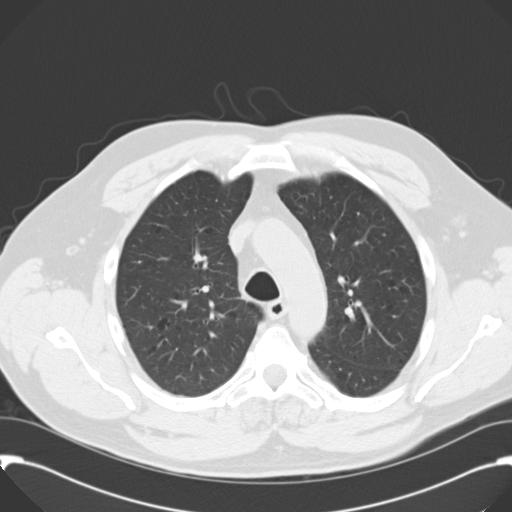
[im 50/65  lung]
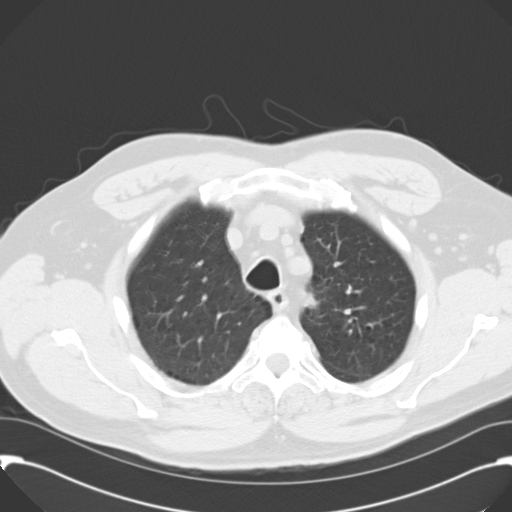
[im 55/65  lung]
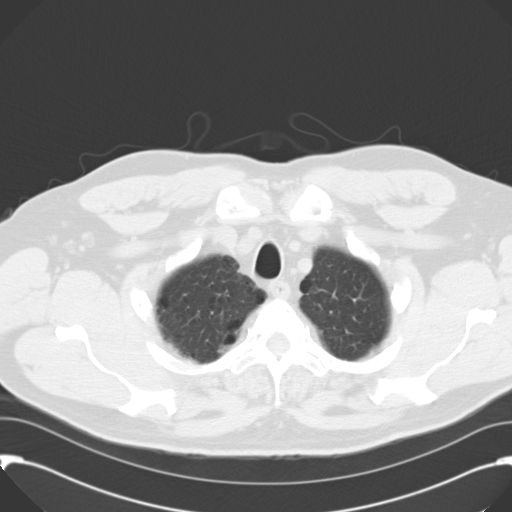
[im 60/65  lung]
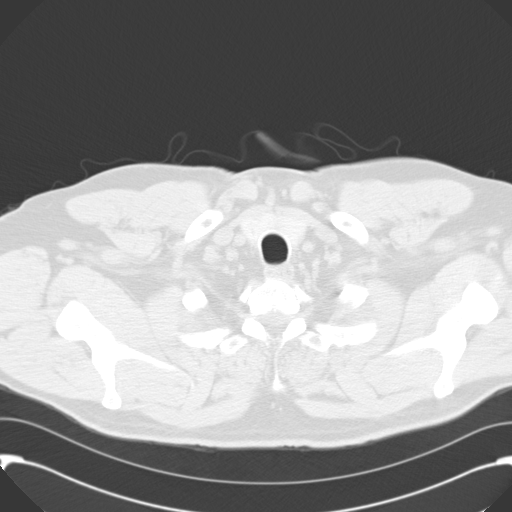

[Series 602: cor · coronal · 0.74mm/px · 3 of 138 slices shown]
[im 28/138  lung]
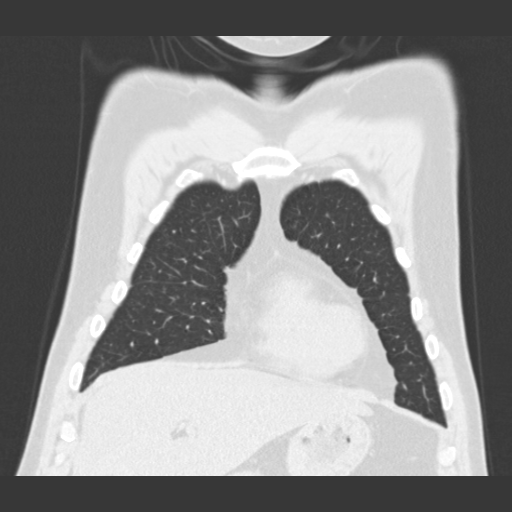
[im 55/138  lung]
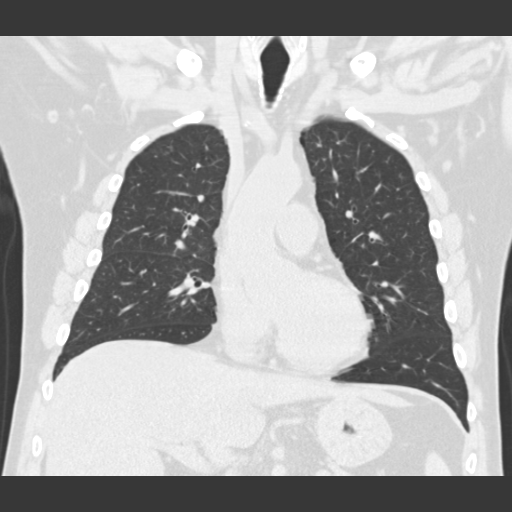
[im 83/138  lung]
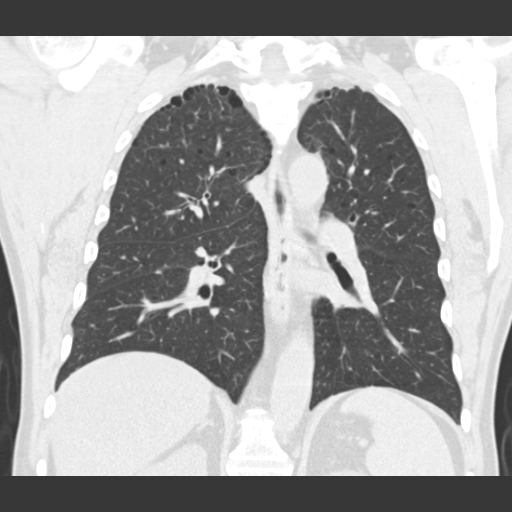

[15 of 36 positions shown; findings below may reference images not displayed]

FINDINGS: Mediastinum/Nodes: The heart size is normal. No pericardial or
pleural effusion is identified. Great vessels are normal in caliber.
No lymphadenopathy.

Lungs/Pleura: Biapical pleural thickening and diffuse emphysematous
changes are reidentified. Mild motion artifact obscures detail.
Triangular 3 mm nodule abutting the minor fissure image 35 is stable
to slightly smaller. 3 mm right middle lobe pulmonary parenchymal
nodule image 41 is less solid than previously and may be slightly
smaller at 2 mm, allowing for differences in measurement technique.

Right lower lobe 2 mm pleural parenchymal nodule image 45 is stable.

Left lower lobe nodule is not identified.

2 mm lingular nodule image 30 is subjectively less prominent than
previously and also may be slightly smaller. No pleural effusions.

Upper abdomen: Hepatic hypodensity partly visualized may indicate
steatosis.

Musculoskeletal: No acute osseous abnormality or lytic or sclerotic
osseous lesion.
IMPRESSION: Stable to slight decrease of most of the previously seen bilateral
pulmonary nodules, with resolution of others. Findings are
suggestive of a benign process. This documents nearly 2 year
stability since the initial available comparison exam [DATE]. If
2 year stability is desired, a final followup chest CT could be
performed [DATE], depending on the patient's risk for malignancy.

## 2015-04-07 ENCOUNTER — Encounter (HOSPITAL_COMMUNITY): Payer: Self-pay | Admitting: Emergency Medicine

## 2015-04-07 ENCOUNTER — Emergency Department (HOSPITAL_COMMUNITY)
Admission: EM | Admit: 2015-04-07 | Discharge: 2015-04-07 | Disposition: A | Discharge status: Home / Self Care | Payer: Managed Care, Other (non HMO) | Attending: Emergency Medicine | Admitting: Emergency Medicine

## 2015-04-07 DIAGNOSIS — Z872 Personal history of diseases of the skin and subcutaneous tissue: Secondary | ICD-10-CM | POA: Insufficient documentation

## 2015-04-07 DIAGNOSIS — Z87891 Personal history of nicotine dependence: Secondary | ICD-10-CM | POA: Insufficient documentation

## 2015-04-07 DIAGNOSIS — Z79899 Other long term (current) drug therapy: Secondary | ICD-10-CM | POA: Diagnosis not present

## 2015-04-07 DIAGNOSIS — E298 Other testicular dysfunction: Secondary | ICD-10-CM | POA: Diagnosis not present

## 2015-04-07 DIAGNOSIS — Z8669 Personal history of other diseases of the nervous system and sense organs: Secondary | ICD-10-CM | POA: Diagnosis not present

## 2015-04-07 DIAGNOSIS — I10 Essential (primary) hypertension: Secondary | ICD-10-CM | POA: Diagnosis not present

## 2015-04-07 DIAGNOSIS — Z7982 Long term (current) use of aspirin: Secondary | ICD-10-CM | POA: Insufficient documentation

## 2015-04-07 DIAGNOSIS — Z85828 Personal history of other malignant neoplasm of skin: Secondary | ICD-10-CM | POA: Insufficient documentation

## 2015-04-07 DIAGNOSIS — K219 Gastro-esophageal reflux disease without esophagitis: Secondary | ICD-10-CM | POA: Insufficient documentation

## 2015-04-07 DIAGNOSIS — M545 Low back pain: Secondary | ICD-10-CM | POA: Diagnosis not present

## 2015-04-07 DIAGNOSIS — M549 Dorsalgia, unspecified: Secondary | ICD-10-CM | POA: Diagnosis present

## 2015-04-07 DIAGNOSIS — J45909 Unspecified asthma, uncomplicated: Secondary | ICD-10-CM | POA: Diagnosis not present

## 2015-04-07 DIAGNOSIS — J449 Chronic obstructive pulmonary disease, unspecified: Secondary | ICD-10-CM | POA: Insufficient documentation

## 2015-04-07 DIAGNOSIS — Z7951 Long term (current) use of inhaled steroids: Secondary | ICD-10-CM | POA: Insufficient documentation

## 2015-04-07 DIAGNOSIS — E785 Hyperlipidemia, unspecified: Secondary | ICD-10-CM | POA: Diagnosis not present

## 2015-04-07 DIAGNOSIS — M109 Gout, unspecified: Secondary | ICD-10-CM | POA: Diagnosis not present

## 2015-04-07 DIAGNOSIS — Z8701 Personal history of pneumonia (recurrent): Secondary | ICD-10-CM | POA: Insufficient documentation

## 2015-04-07 DIAGNOSIS — Z87448 Personal history of other diseases of urinary system: Secondary | ICD-10-CM | POA: Insufficient documentation

## 2015-04-07 HISTORY — DX: Unspecified malignant neoplasm of skin, unspecified: C44.90

## 2015-04-07 MED ORDER — HYDROMORPHONE HCL 2 MG/ML IJ SOLN
2.0000 mg | Freq: Once | INTRAMUSCULAR | Status: AC
  Administered 2015-04-07: 2 mg via INTRAMUSCULAR
  Filled 2015-04-07: qty 1

## 2015-04-07 MED ORDER — CYCLOBENZAPRINE HCL 10 MG PO TABS: 10.0000 mg | ORAL_TABLET | Freq: Two times a day (BID) | ORAL | Status: DC | PRN

## 2015-04-07 MED ORDER — OXYCODONE-ACETAMINOPHEN 5-325 MG PO TABS: 1.0000 | ORAL_TABLET | ORAL | Status: DC | PRN

## 2015-04-07 MED ORDER — NAPROXEN 500 MG PO TABS: 500.0000 mg | ORAL_TABLET | Freq: Two times a day (BID) | ORAL | Status: DC

## 2015-04-07 NOTE — ED Notes (Signed)
Patient c/o lower back pain. Per patient was taking shirt off this morning when back pain hit suddenly. Patient took muscle relaxer with no relief. Patient reports having a similar episode in past and diagnosed with AAA. Denies any chest pain or shortness of breath.

## 2015-04-07 NOTE — ED Provider Notes (Signed)
CSN: [id number]     Arrival date & time 04/07/15  1509 History   First MD Initiated Contact with Patient 04/07/15 1549     Chief Complaint  Patient presents with  . Back Pain     (Consider location/radiation/quality/duration/timing/severity/associated sxs/prior Treatment) HPI.... Back pain while taking off her shirt while sitting on the toilet this morning. Patient feels he pulled a muscle. No radicular symptoms. Past medical history includes a AAA diagnosed in October 2013 with subsequent stenting of same. He is ambulatory today. No neurological deficits. No vomiting or diarrhea. No abdominal pain.  Past Medical History  Diagnosis Date  . Hyperlipidemia   . Gout   . Rash 2011    left chest, biopsy 2012 Dr. Rozann Lesches, results pending  . Dyspnea     normal PFT April 2012  . Tobacco abuse   . Pneumonia     hx of and hx of bronchitis  . GERD (gastroesophageal reflux disease)   . H/O hiatal hernia   . Arthritis     lower back  . Asthma     followed by Dr. Berdie Ogren  . HTN (hypertension)     hx of sees Dr. Graylin Shiver  . Abdominal aortic aneurysm     sees Dr. Einar Gip for cardiac f/u, [phone]  . Other abnormal blood chemistry   . Insomnia, unspecified   . External hemorrhoids without mention of complication   . Other testicular dysfunction   . Other specified erythematous condition(695.89)   . Other dyspnea and respiratory abnormality   . Prostatitis, unspecified   . Other malaise and fatigue   . Chronic airway obstruction, not elsewhere classified   . Skin cancer    Past Surgical History  Procedure Laterality Date  . Appendectomy    . Tonsillectomy    . Wrist surgery      left  . Toe surgery  01/2012    joint of left great toe  . Tympanoplasty    . Abdominal aortic endovascular stent graft  09/22/2012    Procedure: ABDOMINAL AORTIC ENDOVASCULAR STENT GRAFT;  Surgeon: Mal Misty, MD;  Location: [facility];  Service: Vascular;  Laterality: N/A;  GORE; ultrasound  guided.  . Skin cancer excision     Family History  Problem Relation Age of Onset  . Heart attack Father 106  . COPD Father   . Heart disease Father   . Asthma Daughter   . Cancer Mother     Breast  . Lupus Daughter   . Diabetes Other     Grandson    History  Substance Use Topics  . Smoking status: Former Smoker -- 0.20 packs/day for 52.5 years    Types: Cigarettes    Quit date: 09/02/2012  . Smokeless tobacco: Current User    Types: Snuff  . Alcohol Use: No     Comment: past ETOH use quit 11-26-2009    Review of Systems  All other systems reviewed and are negative.     Allergies  Review of patient's allergies indicates no known allergies.  Home Medications   Prior to Admission medications   Medication Sig Start Date End Date Taking? Authorizing Provider  aspirin EC 81 MG tablet Take 81 mg by mouth daily.   Yes Historical Provider, MD  losartan (COZAAR) 50 MG tablet Take one tablet by mouth once daily to control blood pressure Patient taking differently: Take 50 mg by mouth daily.  02/21/15  Yes [name], NP  mometasone-formoterol (DULERA) 200-5 MCG/ACT AERO  Inhale 2 puffs into the lungs 2 (two) times daily. 02/21/15  Yes [name], NP  pantoprazole (PROTONIX) 20 MG tablet Take 1 tablet (20 mg total) by mouth daily. 02/21/15  Yes [name], NP  sildenafil (VIAGRA) 50 MG tablet Take 1 tablet (50 mg total) by mouth daily as needed for erectile dysfunction. 12/12/14  Yes Mahima Bubba Camp, MD  simvastatin (ZOCOR) 20 MG tablet Take 1 tablet (20 mg total) by mouth at bedtime. 02/21/15  Yes [name], NP  albuterol (PROVENTIL HFA;VENTOLIN HFA) 108 (90 BASE) MCG/ACT inhaler Inhale 2 puffs into the lungs every 6 (six) hours as needed. For cough and wheezing. 02/21/15   [name], NP  cyclobenzaprine (FLEXERIL) 10 MG tablet Take 1 tablet (10 mg total) by mouth 2 (two) times daily as needed for muscle spasms. 04/07/15   Nat Christen, MD  fluticasone  [facility]) 50 MCG/ACT nasal spray Place 1 spray into both nostrils daily as needed. For congestion 02/21/15   [name], NP  naproxen (NAPROSYN) 500 MG tablet Take 1 tablet (500 mg total) by mouth 2 (two) times daily. 04/07/15   Nat Christen, MD  niacin 250 MG CR capsule Take 1 capsule (250 mg total) by mouth at bedtime. Patient not taking: Reported on 04/07/2015 12/12/14   Blanchie Serve, MD  oxyCODONE-acetaminophen (PERCOCET) 5-325 MG per tablet Take 1-2 tablets by mouth every 4 (four) hours as needed. 04/07/15   Nat Christen, MD   BP 121/79 mmHg  Pulse 51  Temp(Src) 97.7 F (36.5 C) (Oral)  Resp 18  Ht 6\' 2"  (1.88 m)  Wt 231 lb (104.781 kg)  BMI 29.65 kg/m2  SpO2 99% Physical Exam  Constitutional: He is oriented to person, place, and time. He appears well-developed and well-nourished.  HENT:  Head: Normocephalic and atraumatic.  Eyes: Conjunctivae and EOM are normal. Pupils are equal, round, and reactive to light.  Neck: Normal range of motion. Neck supple.  Cardiovascular: Normal rate and regular rhythm.   Pulmonary/Chest: Effort normal and breath sounds normal.  Abdominal: Soft. Bowel sounds are normal.  Musculoskeletal: Normal range of motion.  Paraspinous tenderness at L3-4-5  Neurological: He is alert and oriented to person, place, and time.  Skin: Skin is warm and dry.  Psychiatric: He has a normal mood and affect. His behavior is normal.  Nursing note and vitals reviewed.   ED Course  Procedures (including critical care time) Labs Review Labs Reviewed - No data to display  Imaging Review No results found.   EKG Interpretation None      MDM   Final diagnoses:  Low back pain without sciatica, unspecified back pain laterality    Physical most consistent with musculoskeletal pain. Discussed the low probability of this pain related to his AAA.  Patient and wife opted not to have any scan done today.  Discharge medications Percocet, Flexeril 10 mg, Naprosyn 500  mg.    Nat Christen, MD 04/07/15 [phone]

## 2015-04-07 NOTE — Discharge Instructions (Signed)
Meds for pain, muscle spasm, inflammation.  Ice.  F/U your dr

## 2015-04-13 ENCOUNTER — Encounter: Payer: Self-pay | Admitting: Internal Medicine

## 2015-04-13 ENCOUNTER — Ambulatory Visit (INDEPENDENT_AMBULATORY_CARE_PROVIDER_SITE_OTHER): Payer: Managed Care, Other (non HMO) | Admitting: Internal Medicine

## 2015-04-13 VITALS — BP 132/90 | HR 71 | Ht 74.0 in | Wt 235.0 lb

## 2015-04-13 DIAGNOSIS — J438 Other emphysema: Secondary | ICD-10-CM

## 2015-04-13 DIAGNOSIS — N529 Male erectile dysfunction, unspecified: Secondary | ICD-10-CM

## 2015-04-13 DIAGNOSIS — R918 Other nonspecific abnormal finding of lung field: Secondary | ICD-10-CM

## 2015-04-13 DIAGNOSIS — R053 Chronic cough: Secondary | ICD-10-CM

## 2015-04-13 DIAGNOSIS — R05 Cough: Secondary | ICD-10-CM

## 2015-04-13 NOTE — Progress Notes (Signed)
Subjective:    Patient ID: Aaron [name], male    DOB: February 04, 1956, 59 y.o.   MRN: [id number]  HPI    HPI #Smoker  - 52 pack. Quit October 2013    #Ex-etoh  - quit 2011  #ASthma/Emphysema Dyspnea  - normal cardiac stress test 2006 and in October 2013  - CXR 2011 - hyperinflation  - PFTs April 2012 (off advair ) - normal.  -  methacholine challenge test 03/05/2011: POSITIVE FOR ASTHMA (PC 20 <4). - CT June 2014: mild emphysema (dad had emphysema)  #Maintenance of health  - pneumovax 2012       OV 03/21/2013  Routine office visit for the above. He presents with his wife Aaron [name] who is here with him for the first time.  - He underwent abdominal Arctic aneurysm repair in October 2013. Surgery was uneventful. He has been doing well. It appears that postoperatively he was placed on lisinopril for hypertension control. Sometime shortly after that but especially so in the last 1-2 months he and his wife reporting insidious onset of cough that is associated with sputum and some wheezing. that is present all day. Rated as moderate in severity. Associated volume of sputum is small and clear. There is no associated hemoptysis no weight loss or wheezing. RSI cough score is 28 and suggest irritable larynx cough. Am happy to note that he quit smoking October 2013. He is also compliant with his Advair which I do note is a dry powdered inhaler   REC   #cough  - please stop lisinopril 10mg ; suspect this is contributing to cough  - instead start losartan 25mg  daily  - continue advair 250/50, 1 puff twice daily  - return in 1 month  - cough score at followup  - spirometry at followup  #BP  - monitor BP as before per advice of your cardiologist/primary care but with the changes  - any BP issues call them  #Followup  1 month with spirometry and Cough score at followup   OV 04/20/2013 Presents with wife Aaron [name]. . He's here to evaluate   - cough and dyspnea after stopping lisinopril.  -  But also checking his blood pressure after changing his lisinopril to losartan. - Review his smoking: Still in remission since October 2013  Currently he is on  advair: Spirometry shows an FEV1 of 3.3L/79% and reduced ratio. Consistent with mild early obstruction possibly. Still with mucus and cough. 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 His RSI cough score is improved to 14 shows a 50% reduction in cough severe he but subjectively he does not feel any better. In fact is complaining of mucus production. He is frustrated that despite quitting smoking that cough has not resolved and he feels that he might as well go back to smoking again.   REC Chronic cough with asthma - Stop Advair - Start Dulera 100/52 puff 2 times daily; use with a spacer - Use albuterol as needed - Start nasal steroid Nasacort or Nasonex over-the-counter 2 squirts in each nostril once daily - Start 3% hypertonic saline nasal spray made that company called [employer] 2 squirts in each nostril once at night - Avoid colas spices cheeses pizzas and fried food - Continue acid reflux Protonix - Have CT scan of the chest on 04/26/2013   Followup - 1 month with cough score - Shortness of breath still persists at followup we'll do bike pulmonary stress test   Telephone call June 2014 CT 04/26/13  -  just mild emphysema c/w breathing test - small nodules max 33mm that are unlikely lung cancer but need fu ct in 9 months   -  uThere are multiple small bilateral pulmonary nodules including 5 mm  nodules on images 33 and 34 of series 4 in the right midzone as  well as 4 mm nodules on image number 40 and 44 in the right middle  and lower lobes respectively. There is also a 4 mm nodule on image  number 47 in the left lower lobe.    OV 05/25/2013  .DYspnea:  Much improved. Moving furniture and heavy objects. No dyspnea now after starting dulera. CT June 2014 - shows mild empysema  Cough: hugely better. RSI Cough score down  to 8. Only mild cough. Feels current quality of life is good. Feels dulera helped a lot. Using OTC nasal steroid, Not using saloine nasal spray  New issue: severeal scatterred pulmnary nodules < 5 mm both sides in CT June 2014  #Chronic cough with asthma/emphysema - Glad you are much better - Continue Dulera 100/52 puff 2 times daily; use with a spacer - Use albuterol as needed - Continue steroid Nasacort or Nasonex over-the-counter 2 squirts in each nostril once daily - Start 3% hypertonic saline nasal spray made that company called [employer] 2 squirts in each nostril once at night - Avoid colas spices cheeses pizzas and fried food - Continue acid reflux Protonix  #Lung nodule - Have CT scan without contrast of the chest in 9 months  Followup - 9 month with cough score  OV 04/13/2014  Chief Complaint  Patient presents with  . Follow-up    pt states cough is mostly resolved.   Chronic cough - multifactorial with CT evidence of mild emphysema: essentially resolved. Doees not want to give up dulera. RSI cough score is 2  Pulmonary nodules: stable since June 2015, 65mm nodules  Tobacco:  reports that he quit smoking about 19 months ago. His smoking use included Cigarettes. He has a 10.5 pack-year smoking history. His smokeless tobacco use includes Snuff.    IMPRESSION: CT chest ap[ril 2015 Stable bilateral pulmonary nodules the largest measuring 5 mm. These  have been stable since June 2014. Typically, documentation of at  least 12 month stability is suggested to confirm benignity.  Electronically Signed  By: Skipper Cliche M.D.  On: 03/03/2014 16:17     OV 04/13/2015  Chief Complaint  Patient presents with  . Follow-up    Pt here to f/u after CT scan. Pt denies SOB, cough and CP/tightness.    -New issue: he has chronic erectile dysfunction. He wants samples of cialis/viagra. Not sure if we have it.     - Chronic cough with mild emphysema/asthma: It has resolved cough.  STill on dulara. Likes it. Does not want to stop. Wil ldo high dose 1 puff bid through PCP EUBANKS, JESSICA K, NP to make it last longer. Of note, he has never had alpha 1 genetic test for copd. I tihnk his dad had copd    - Smoking: quit 2 years agol Tells. Me he smoked 2ppd x 30 years. So really 60ppd. Wrong documentation in epic as 10ppd smoking. So this amount of smoking really qualifies him for lung cancer screening program  - Pulmonary nodules:I - persnally reviewed image and agree MPRESSION: Stable to slight decrease of most of the previously seen bilateral pulmonary nodules, with resolution of others. Findings are suggestive of a benign process. This documents nearly 2  year stability since the initial available comparison exam 04/26/2013. If 2 year stability is desired, a final followup chest CT could be performed June 2016, depending on the patient's risk for malignancy.   Electronically Signed  By: Conchita Paris M.D.  On: 03/12/2015 17:29     Review of Systems  Constitutional: Negative for fever and unexpected weight change.  HENT: Negative for congestion, dental problem, ear pain, nosebleeds, postnasal drip, rhinorrhea, sinus pressure, sneezing, sore throat and trouble swallowing.   Eyes: Negative for redness and itching.  Respiratory: Negative for cough, chest tightness, shortness of breath and wheezing.   Cardiovascular: Negative for palpitations and leg swelling.  Gastrointestinal: Negative for nausea and vomiting.  Genitourinary: Negative for dysuria.  Musculoskeletal: Negative for joint swelling.  Skin: Negative for rash.  Neurological: Negative for headaches.  Hematological: Does not bruise/bleed easily.  Psychiatric/Behavioral: Negative for dysphoric mood. The patient is not nervous/anxious.        Objective:   Physical Exam  Constitutional: He is oriented to person, place, and time. He appears well-developed and well-nourished. No distress.  HENT:    Head: Normocephalic and atraumatic.  Right Ear: External ear normal.  Left Ear: External ear normal.  Mouth/Throat: Oropharynx is clear and moist. No oropharyngeal exudate.  Eyes: Conjunctivae and EOM are normal. Pupils are equal, round, and reactive to light. Right eye exhibits no discharge. Left eye exhibits no discharge. No scleral icterus.  Neck: Normal range of motion. Neck supple. No JVD present. No tracheal deviation present. No thyromegaly present.  Cardiovascular: Normal rate, regular rhythm and intact distal pulses.  Exam reveals no gallop and no friction rub.   No murmur heard. Pulmonary/Chest: Effort normal and breath sounds normal. No respiratory distress. He has no wheezes. He has no rales. He exhibits no tenderness.  Abdominal: Soft. Bowel sounds are normal. He exhibits no distension and no mass. There is no tenderness. There is no rebound and no guarding.  Visceral obesity  Musculoskeletal: Normal range of motion. He exhibits no edema or tenderness.  Lymphadenopathy:    He has no cervical adenopathy.  Neurological: He is alert and oriented to person, place, and time. He has normal reflexes. No cranial nerve deficit. Coordination normal.  Skin: Skin is warm and dry. No rash noted. He is not diaphoretic. No erythema. No pallor.  Psychiatric: He has a normal mood and affect. His behavior is normal. Judgment and thought content normal.  Nursing note and vitals reviewed.   Filed Vitals:   04/13/15 1651  BP: 132/90  Pulse: 71  Height: 6\' 2"  (1.88 m)  Weight: 235 lb (106.595 kg)  SpO2: 97%         Assessment & Plan:     ICD-9-CM ICD-10-CM   1. Lung nodule, multiple 793.19 R91.8 Alpha-1 antitrypsin phenotype  2. Chronic cough 786.2 R05 Alpha-1 antitrypsin phenotype  3. Other emphysema 492.8 J43.8 Alpha-1 antitrypsin phenotype  4. Erectile dysfunction, unspecified erectile dysfunction type 607.84 N52.9     Lung nodule - stable x 2 years.  No need for any ct chest for  these pre-existing nodules. However, 60ppd smoking hx qualifies him for lung cancer screening. I  Will h ave NP from lung cancer screening program call and explain about future scans  Cough/emphysema: doing well. Continue dulera high dose 1 puff twice daily. Do alpha 1 blood test for genetic cause of copd  ED: will look for sample cialis/viagra - if we have it   Followup  =- none active - return  as needed   Dr. Brand Males, M.D., [facility].C.P Pulmonary and Critical Care Medicine Staff Physician [place] [facility] Pager: [phone], If no answer or between  15:00h - 7:00h: call [phone]  04/15/2015 6:34 PM     Dr. Brand Males, M.D., F.C.C.P Pulmonary and Critical Care Medicine Staff Physician [place] [facility] Pager: [phone], If no answer or between  15:00h - 7:00h: call [phone]  04/15/2015 6:33 PM

## 2015-04-13 NOTE — Patient Instructions (Addendum)
ICD-9-CM ICD-10-CM   1. Lung nodule, multiple 793.19 R91.8 Alpha-1 antitrypsin phenotype  2. Chronic cough 786.2 R05 Alpha-1 antitrypsin phenotype  3. Other emphysema 492.8 J43.8 Alpha-1 antitrypsin phenotype  4. Erectile dysfunction, unspecified erectile dysfunction type 607.84 N52.9    Lung nodule - stable x 2 years. Will h ave NP from lung cancer screening program call and explain about future scans  Cough/emphysema: doing well. Continue dulera high dose 1 puff twice daily. Do alpha 1 blood test  ED: will look for sample cialis/viagra - if we have it   Followup  =- none active - return as needed

## 2015-04-15 DIAGNOSIS — J438 Other emphysema: Secondary | ICD-10-CM | POA: Insufficient documentation

## 2015-04-19 NOTE — Progress Notes (Signed)
Quick Note:  Results discussed to pt at [facility] on 04/13/15. Nothing further needed. ______

## 2015-05-29 ENCOUNTER — Other Ambulatory Visit: Payer: Self-pay | Admitting: Nurse Practitioner

## 2015-06-12 ENCOUNTER — Ambulatory Visit: Payer: 59 | Admitting: Internal Medicine

## 2015-06-14 ENCOUNTER — Ambulatory Visit: Payer: 59 | Admitting: Nurse Practitioner

## 2015-06-19 ENCOUNTER — Other Ambulatory Visit: Payer: Self-pay | Admitting: Nurse Practitioner

## 2015-08-09 ENCOUNTER — Ambulatory Visit (INDEPENDENT_AMBULATORY_CARE_PROVIDER_SITE_OTHER): Payer: 59 | Admitting: Nurse Practitioner

## 2015-08-09 ENCOUNTER — Encounter: Payer: Self-pay | Admitting: Nurse Practitioner

## 2015-08-09 VITALS — BP 122/92 | HR 64 | Temp 97.6°F | Resp 18 | Ht 74.0 in | Wt 237.8 lb

## 2015-08-09 DIAGNOSIS — K219 Gastro-esophageal reflux disease without esophagitis: Secondary | ICD-10-CM

## 2015-08-09 DIAGNOSIS — J438 Other emphysema: Secondary | ICD-10-CM | POA: Diagnosis not present

## 2015-08-09 DIAGNOSIS — I10 Essential (primary) hypertension: Secondary | ICD-10-CM | POA: Diagnosis not present

## 2015-08-09 DIAGNOSIS — Z125 Encounter for screening for malignant neoplasm of prostate: Secondary | ICD-10-CM | POA: Diagnosis not present

## 2015-08-09 DIAGNOSIS — Z23 Encounter for immunization: Secondary | ICD-10-CM

## 2015-08-09 DIAGNOSIS — E785 Hyperlipidemia, unspecified: Secondary | ICD-10-CM | POA: Diagnosis not present

## 2015-08-09 DIAGNOSIS — R7303 Prediabetes: Secondary | ICD-10-CM

## 2015-08-09 DIAGNOSIS — R7309 Other abnormal glucose: Secondary | ICD-10-CM | POA: Diagnosis not present

## 2015-08-09 NOTE — Progress Notes (Signed)
Patient ID: Aaron [name], male   DOB: Oct 03, 1956, 59 y.o.   MRN: [id number]    PCP: [name], NP  Advanced Directive information Does patient have an advance directive?: No, Would patient like information on creating an advanced directive?: Yes - Educational materials given  No Known Allergies  Chief Complaint  Patient presents with  . Medical Management of Chronic Issues    6 month follow-up for Hypertension ,Hyperlipidemia  . OTHER    [place] Advanced Directive  . Immunizations    Will take flu shot     HPI: Patient is a 59 y.o. male seen in the office today for routine follow up on chronic conditions. Pt with a hx of gout, hyperlipidemia, ED, prediabetes and AAA with HTN and smoking. Has followed up with pulmonology, lung nodule noted to be stable for 2 years. Remains on dulera for emphysema no worsening shortness of breath, cough or congestion.  No acid reflux, taking protonix without symptoms Has follow up with vein and vascular, CT scan prior to appt Will get flu shot  No complaints today Review of Systems:  Review of Systems  Constitutional: Negative for activity change, appetite change, fatigue and unexpected weight change.  HENT: Negative for congestion and hearing loss.   Eyes: Negative.   Respiratory: Negative for cough and shortness of breath.   Cardiovascular: Negative for chest pain, palpitations and leg swelling.  Gastrointestinal: Negative for abdominal pain, diarrhea and constipation.  Genitourinary: Negative for dysuria and difficulty urinating.  Musculoskeletal: Negative for myalgias and arthralgias.  Skin: Negative for color change and wound.  Neurological: Negative for dizziness and weakness.  Psychiatric/Behavioral: Negative for behavioral problems, confusion and agitation.    Past Medical History  Diagnosis Date  . Hyperlipidemia   . Gout   . Rash 2011    left chest, biopsy 2012 Dr. Rozann Lesches, results pending  . Dyspnea     normal PFT  April 2012  . Tobacco abuse   . Pneumonia     hx of and hx of bronchitis  . GERD (gastroesophageal reflux disease)   . H/O hiatal hernia   . Arthritis     lower back  . Asthma     followed by Dr. Berdie Ogren  . HTN (hypertension)     hx of sees Dr. Graylin Shiver  . Abdominal aortic aneurysm     sees Dr. Einar Gip for cardiac f/u, [phone]  . Other abnormal blood chemistry   . Insomnia, unspecified   . External hemorrhoids without mention of complication   . Other testicular dysfunction   . Other specified erythematous condition(695.89)   . Other dyspnea and respiratory abnormality   . Prostatitis, unspecified   . Other malaise and fatigue   . Chronic airway obstruction, not elsewhere classified   . Skin cancer    Past Surgical History  Procedure Laterality Date  . Appendectomy    . Tonsillectomy    . Wrist surgery      left  . Toe surgery  01/2012    joint of left great toe  . Tympanoplasty    . Abdominal aortic endovascular stent graft  09/22/2012    Procedure: ABDOMINAL AORTIC ENDOVASCULAR STENT GRAFT;  Surgeon: Mal Misty, MD;  Location: [facility];  Service: Vascular;  Laterality: N/A;  GORE; ultrasound guided.  . Skin cancer excision     Social History:   reports that he quit smoking about 2 years ago. His smoking use included Cigarettes. He has a 10.5 pack-year smoking history.  He has quit using smokeless tobacco. His smokeless tobacco use included Snuff. He reports that he does not drink alcohol or use illicit drugs.  Family History  Problem Relation Age of Onset  . Heart attack Father 51  . COPD Father   . Heart disease Father   . Asthma Daughter   . Cancer Mother     Breast  . Lupus Daughter   . Diabetes Other     Grandson     Medications: Patient's Medications  New Prescriptions   No medications on file  Previous Medications   ALBUTEROL (PROVENTIL HFA;VENTOLIN HFA) 108 (90 BASE) MCG/ACT INHALER    Inhale 2 puffs into the lungs every 6 (six) hours as  needed. For cough and wheezing.   ASPIRIN EC 81 MG TABLET    Take 81 mg by mouth daily.   LOSARTAN (COZAAR) 50 MG TABLET    TAKE ONE TABLET BY MOUTH ONCE DAILY TO CONTROL BLOOD PRESSURE   MOMETASONE-FORMOTEROL (DULERA) 200-5 MCG/ACT AERO    Inhale 2 puffs into the lungs 2 (two) times daily.   PANTOPRAZOLE (PROTONIX) 20 MG TABLET    TAKE 1 TABLET (20 MG TOTAL) BY MOUTH DAILY.   SILDENAFIL (VIAGRA) 50 MG TABLET    Take 1 tablet (50 mg total) by mouth daily as needed for erectile dysfunction.   SIMVASTATIN (ZOCOR) 20 MG TABLET    TAKE 1 TABLET (20 MG TOTAL) BY MOUTH AT BEDTIME.  Modified Medications   No medications on file  Discontinued Medications   CYCLOBENZAPRINE (FLEXERIL) 10 MG TABLET    Take 1 tablet (10 mg total) by mouth 2 (two) times daily as needed for muscle spasms.   FLUTICASONE (FLONASE) 50 MCG/ACT NASAL SPRAY    Place 1 spray into both nostrils daily as needed. For congestion   NAPROXEN (NAPROSYN) 500 MG TABLET    Take 1 tablet (500 mg total) by mouth 2 (two) times daily.   NIACIN 250 MG CR CAPSULE    Take 1 capsule (250 mg total) by mouth at bedtime.   OXYCODONE-ACETAMINOPHEN (PERCOCET) 5-325 MG PER TABLET    Take 1-2 tablets by mouth every 4 (four) hours as needed.     Physical Exam:  Filed Vitals:   08/09/15 1614  BP: 122/92  Pulse: 64  Temp: 97.6 F (36.4 C)  TempSrc: Oral  Resp: 18  Height: 6\' 2"  (1.88 m)  Weight: 237 lb 12.8 oz (107.865 kg)  SpO2: 95%   Body mass index is 30.52 kg/(m^2).  Physical Exam  Constitutional: He is oriented to person, place, and time. He appears well-developed and well-nourished. No distress.  HENT:  Head: Normocephalic and atraumatic.  Mouth/Throat: Oropharynx is clear and moist. No oropharyngeal exudate.  Eyes: Conjunctivae and EOM are normal. Pupils are equal, round, and reactive to light.  Neck: Normal range of motion. Neck supple.  Cardiovascular: Normal rate, regular rhythm and normal heart sounds.   Pulmonary/Chest: Effort  normal and breath sounds normal.  Abdominal: Soft. Bowel sounds are normal.  Musculoskeletal: He exhibits no edema or tenderness.  Neurological: He is alert and oriented to person, place, and time.  Skin: Skin is warm and dry. He is not diaphoretic.  Psychiatric: He has a normal mood and affect.    Labs reviewed: Basic Metabolic Panel:  Recent Labs  12/12/14 0933  TSH 1.520   Liver Function Tests: No results for input(s): AST, ALT, ALKPHOS, BILITOT, PROT, ALBUMIN in the last 8760 hours. No results for input(s): LIPASE, AMYLASE in  the last 8760 hours. No results for input(s): AMMONIA in the last 8760 hours. CBC:  Recent Labs  12/08/14 0806  WBC 5.4  NEUTROABS 3.2  HGB 14.8  HCT 44.9  MCV 92   Lipid Panel:  Recent Labs  12/08/14 0806  CHOL 123  HDL 38*  LDLCALC 70  TRIG 74  CHOLHDL 3.2   TSH:  Recent Labs  12/12/14 0933  TSH 1.520   A1C: Lab Results  Component Value Date   HGBA1C 5.9* 12/12/2014     Assessment/Plan 1. Essential hypertension -blood pressure controlled on losartan -conts ASA daily - CBC with Differential  2. Other emphysema Symptoms stable, conts on duera 1 puff twice daily  3. Prediabetes -cont diet modifications, follow up Hemoglobin A1c  4. Gastroesophageal reflux disease, esophagitis presence not specified -no symptoms, may stop Protonix, if symptoms return to resume protonix 20 mg daily   5. Hyperlipidemia -cont heart healthy diet -LDL at goal in January -cont zocor  - Comprehensive metabolic panel - Lipid panel; Future  6. Prostate cancer screening - PSA; Future prior to physical   7. Encounter for immunization -flu vaccine   8. ED -unchanged samples of Vigra 50 mg given  To follow up in jan for EV with fasting blood work prior to visit   [employer]. Harle Battiest  [facility] [phone] 8 am - 5 pm) [phone] (after hours)

## 2015-08-09 NOTE — Patient Instructions (Signed)
To start protonix, if acid reflux symptoms resume to restart medication  To follow up at the end of January for Physical with fasting lab work prior to visit

## 2015-08-10 LAB — CBC WITH DIFFERENTIAL/PLATELET
BASOS: 1 %
Basophils Absolute: 0 10*3/uL (ref 0.0–0.2)
EOS (ABSOLUTE): 0.2 10*3/uL (ref 0.0–0.4)
Eos: 2 %
HEMATOCRIT: 41.3 % (ref 37.5–51.0)
HEMOGLOBIN: 14.2 g/dL (ref 12.6–17.7)
IMMATURE GRANS (ABS): 0 10*3/uL (ref 0.0–0.1)
Immature Granulocytes: 0 %
LYMPHS ABS: 2.1 10*3/uL (ref 0.7–3.1)
LYMPHS: 31 %
MCH: 29.8 pg (ref 26.6–33.0)
MCHC: 34.4 g/dL (ref 31.5–35.7)
MCV: 87 fL (ref 79–97)
MONOCYTES: 12 %
Monocytes Absolute: 0.8 10*3/uL (ref 0.1–0.9)
NEUTROS ABS: 3.7 10*3/uL (ref 1.4–7.0)
Neutrophils: 54 %
Platelets: 270 10*3/uL (ref 150–379)
RBC: 4.77 x10E6/uL (ref 4.14–5.80)
RDW: 13.2 % (ref 12.3–15.4)
WBC: 6.8 10*3/uL (ref 3.4–10.8)

## 2015-08-10 LAB — HEMOGLOBIN A1C
Est. average glucose Bld gHb Est-mCnc: 126 mg/dL
Hgb A1c MFr Bld: 6 % — ABNORMAL HIGH (ref 4.8–5.6)

## 2015-08-10 LAB — COMPREHENSIVE METABOLIC PANEL
A/G RATIO: 1.7 (ref 1.1–2.5)
ALBUMIN: 4.3 g/dL (ref 3.5–5.5)
ALT: 32 IU/L (ref 0–44)
AST: 26 IU/L (ref 0–40)
Alkaline Phosphatase: 92 IU/L (ref 39–117)
BILIRUBIN TOTAL: 0.3 mg/dL (ref 0.0–1.2)
BUN/Creatinine Ratio: 13 (ref 9–20)
BUN: 13 mg/dL (ref 6–24)
CHLORIDE: 102 mmol/L (ref 97–108)
CO2: 24 mmol/L (ref 18–29)
Calcium: 9.3 mg/dL (ref 8.7–10.2)
Creatinine, Ser: 0.97 mg/dL (ref 0.76–1.27)
GFR calc Af Amer: 100 mL/min/{1.73_m2} (ref >59)
GFR, EST NON AFRICAN AMERICAN: 87 mL/min/{1.73_m2} (ref >59)
GLUCOSE: 91 mg/dL (ref 65–99)
Globulin, Total: 2.6 g/dL (ref 1.5–4.5)
Potassium: 4.4 mmol/L (ref 3.5–5.2)
Sodium: 141 mmol/L (ref 134–144)
Total Protein: 6.9 g/dL (ref 6.0–8.5)

## 2015-08-24 ENCOUNTER — Other Ambulatory Visit: Payer: Self-pay | Admitting: Nurse Practitioner

## 2015-09-18 ENCOUNTER — Other Ambulatory Visit: Payer: Self-pay | Admitting: Nurse Practitioner

## 2015-10-02 ENCOUNTER — Ambulatory Visit (INDEPENDENT_AMBULATORY_CARE_PROVIDER_SITE_OTHER): Payer: 59 | Admitting: Nurse Practitioner

## 2015-10-02 ENCOUNTER — Encounter: Payer: Self-pay | Admitting: Nurse Practitioner

## 2015-10-02 VITALS — BP 120/68 | HR 88 | Temp 97.7°F | Resp 20 | Ht 74.0 in | Wt 239.6 lb

## 2015-10-02 DIAGNOSIS — E785 Hyperlipidemia, unspecified: Secondary | ICD-10-CM

## 2015-10-02 NOTE — Progress Notes (Signed)
Patient ID: Aaron [name], male   DOB: 08-13-1956, 59 y.o.   MRN: [id number]    PCP: [name], NP  Advanced Directive information Does patient have an advance directive?: No, Would patient like information on creating an advanced directive?: No - patient declined information  No Known Allergies  Chief Complaint  Patient presents with  . Medical Management of Chronic Issues    forms to be filled out      HPI: Patient is a 59 y.o. male seen in the office today to have forms filled out for insurance. pts wife is retiring and then his job is phasing him out so he will be looking for another job. In the meantime insurance is changing. No other issues today Doing well, feeling well. Walking daily around the block No tobacco use for ~3 years.    Review of Systems:  Review of Systems  Constitutional: Negative for activity change, appetite change, fatigue and unexpected weight change.  HENT: Negative for congestion and hearing loss.   Eyes: Negative.   Respiratory: Negative for cough and shortness of breath.   Cardiovascular: Negative for chest pain, palpitations and leg swelling.  Gastrointestinal: Negative for abdominal pain, diarrhea and constipation.  Genitourinary: Negative for dysuria and difficulty urinating.  Musculoskeletal: Negative for myalgias and arthralgias.  Skin: Negative for color change and wound.  Neurological: Negative for dizziness and weakness.  Psychiatric/Behavioral: Negative for behavioral problems, confusion and agitation.    Past Medical History  Diagnosis Date  . Hyperlipidemia   . Gout   . Rash 2011    left chest, biopsy 2012 Dr. Rozann Lesches, results pending  . Dyspnea     normal PFT April 2012  . Tobacco abuse   . Pneumonia     hx of and hx of bronchitis  . GERD (gastroesophageal reflux disease)   . H/O hiatal hernia   . Arthritis     lower back  . Asthma     followed by Dr. Berdie Ogren  . HTN (hypertension)     hx of sees Dr. Graylin Shiver    . Abdominal aortic aneurysm [facility])     sees Dr. Einar Gip for cardiac f/u, [phone]  . Other abnormal blood chemistry   . Insomnia, unspecified   . External hemorrhoids without mention of complication   . Other testicular dysfunction   . Other specified erythematous condition(695.89)   . Other dyspnea and respiratory abnormality   . Prostatitis, unspecified   . Other malaise and fatigue   . Chronic airway obstruction, not elsewhere classified   . Skin cancer    Past Surgical History  Procedure Laterality Date  . Appendectomy    . Tonsillectomy    . Wrist surgery      left  . Toe surgery  01/2012    joint of left great toe  . Tympanoplasty    . Abdominal aortic endovascular stent graft  09/22/2012    Procedure: ABDOMINAL AORTIC ENDOVASCULAR STENT GRAFT;  Surgeon: Mal Misty, MD;  Location: [place];  Service: Vascular;  Laterality: N/A;  GORE; ultrasound guided.  . Skin cancer excision     Social History:   reports that he quit smoking about 3 years ago. His smoking use included Cigarettes. He has a 10.5 pack-year smoking history. He has quit using smokeless tobacco. His smokeless tobacco use included Snuff. He reports that he does not drink alcohol or use illicit drugs.  Family History  Problem Relation Age of Onset  . Heart attack Father 29  .  COPD Father   . Heart disease Father   . Asthma Daughter   . Cancer Mother     Breast  . Lupus Daughter   . Diabetes Other     Grandson     Medications: Patient's Medications  New Prescriptions   No medications on file  Previous Medications   ACTICLATE 75 MG TABS    Take 1 tablet by mouth 2 (two) times daily.   ALBUTEROL (PROVENTIL HFA;VENTOLIN HFA) 108 (90 BASE) MCG/ACT INHALER    Inhale 2 puffs into the lungs every 6 (six) hours as needed. For cough and wheezing.   ASPIRIN EC 81 MG TABLET    Take 81 mg by mouth daily.   LOSARTAN (COZAAR) 50 MG TABLET    TAKE ONE TABLET BY MOUTH ONCE DAILY TO CONTROL BLOOD PRESSURE    MOMETASONE-FORMOTEROL (DULERA) 200-5 MCG/ACT AERO    Inhale 2 puffs into the lungs 2 (two) times daily.   PANTOPRAZOLE (PROTONIX) 20 MG TABLET    TAKE 1 TABLET (20 MG TOTAL) BY MOUTH DAILY.   SILDENAFIL (VIAGRA) 50 MG TABLET    Take 1 tablet (50 mg total) by mouth daily as needed for erectile dysfunction.   SIMVASTATIN (ZOCOR) 20 MG TABLET    TAKE 1 TABLET BY MOUTH AT BEDTIME  Modified Medications   No medications on file  Discontinued Medications   No medications on file     Physical Exam:  Filed Vitals:   10/02/15 1420  BP: 120/68  Pulse: 88  Temp: 97.7 F (36.5 C)  TempSrc: Oral  Resp: 20  Height: 6\' 2"  (1.88 m)  Weight: 239 lb 9.6 oz (108.682 kg)  SpO2: 97%   Body mass index is 30.75 kg/(m^2).  Physical Exam  Constitutional: He is oriented to person, place, and time. He appears well-developed and well-nourished. No distress.  HENT:  Head: Normocephalic and atraumatic.  Mouth/Throat: Oropharynx is clear and moist. No oropharyngeal exudate.  Eyes: Conjunctivae and EOM are normal. Pupils are equal, round, and reactive to light.  Neck: Normal range of motion. Neck supple.  Cardiovascular: Normal rate, regular rhythm and normal heart sounds.   Pulmonary/Chest: Effort normal and breath sounds normal.  Abdominal: Soft. Bowel sounds are normal.  Musculoskeletal: He exhibits no edema or tenderness.  Neurological: He is alert and oriented to person, place, and time.  Skin: Skin is warm and dry. He is not diaphoretic.  Psychiatric: He has a normal mood and affect.    Labs reviewed: Basic Metabolic Panel:  Recent Labs  12/12/14 0933 08/09/15 1642  NA  --  141  K  --  4.4  CL  --  102  CO2  --  24  GLUCOSE  --  91  BUN  --  13  CREATININE  --  0.97  CALCIUM  --  9.3  TSH 1.520  --    Liver Function Tests:  Recent Labs  08/09/15 1642  AST 26  ALT 32  ALKPHOS 92  BILITOT 0.3  PROT 6.9  ALBUMIN 4.3   No results for input(s): LIPASE, AMYLASE in the last 8760  hours. No results for input(s): AMMONIA in the last 8760 hours. CBC:  Recent Labs  12/08/14 0806 08/09/15 1642  WBC 5.4 6.8  NEUTROABS 3.2 3.7  HGB 14.8  --   HCT 44.9 41.3  MCV 92  --    Lipid Panel:  Recent Labs  12/08/14 0806  CHOL 123  HDL 38*  LDLCALC 70  TRIG 74  CHOLHDL 3.2   TSH:  Recent Labs  12/12/14 0933  TSH 1.520   A1C: Lab Results  Component Value Date   HGBA1C 6.0* 08/09/2015     Assessment/Plan 1. Hyperlipidemia Form completed with information regarding weight, blood pressure and cholesterol.  -conts on zocor daily  2. Hypertension -stable on losartan 50 mg daily   Keep follow up for CPE in January with fasting lab work prior to visit [employer]. Harle Battiest  [facility] [phone] 8 am - 5 pm) [phone] (after hours)

## 2015-10-31 ENCOUNTER — Encounter: Payer: Self-pay | Admitting: Vascular Surgery

## 2015-11-06 ENCOUNTER — Ambulatory Visit (INDEPENDENT_AMBULATORY_CARE_PROVIDER_SITE_OTHER): Payer: Managed Care, Other (non HMO) | Admitting: Vascular Surgery

## 2015-11-06 ENCOUNTER — Ambulatory Visit
Admission: RE | Admit: 2015-11-06 | Discharge: 2015-11-06 | Disposition: A | Discharge status: Home / Self Care | Payer: Managed Care, Other (non HMO) | Source: Ambulatory Visit | Attending: Vascular Surgery | Admitting: Vascular Surgery

## 2015-11-06 ENCOUNTER — Encounter: Payer: Self-pay | Admitting: Vascular Surgery

## 2015-11-06 VITALS — BP 120/79 | HR 57 | Temp 96.9°F | Resp 14 | Ht 74.0 in | Wt 236.0 lb

## 2015-11-06 DIAGNOSIS — Z48812 Encounter for surgical aftercare following surgery on the circulatory system: Secondary | ICD-10-CM

## 2015-11-06 DIAGNOSIS — I714 Abdominal aortic aneurysm, without rupture, unspecified: Secondary | ICD-10-CM

## 2015-11-06 IMAGING — CT CT CTA ABD/PEL W/CM AND/OR W/O CM
3 of 10 series · 10 of 36 positions shown, 15 images · IV contrast (75CC ISOVUE 370)
Comparison: [DATE]

CLINICAL DATA: Endovascular repair of an abdominal aortic aneurysm
in [H5].

EXAM:
CT ANGIOGRAPHY ABDOMEN AND PELVIS
TECHNIQUE: Multidetector CT imaging of the abdomen and pelvis was performed
using the standard protocol during bolus administration of
intravenous contrast. Multiplanar reconstructed images including
MIPs were obtained and reviewed to evaluate the vascular anatomy.
CONTRAST:  75 mL Isovue 370

[Series 5: angio 2.5 · axial · 0.84mm/px · z∈[-422,-120]mm · 4 of 203 slices shown, 9 images]
[im 41/203  soft-tissue]
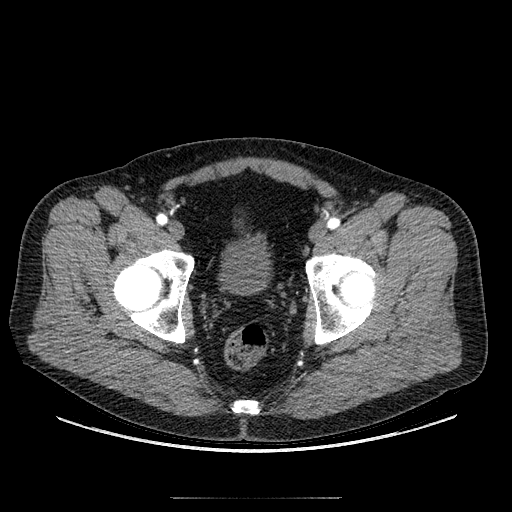
[im 41/203  lung]
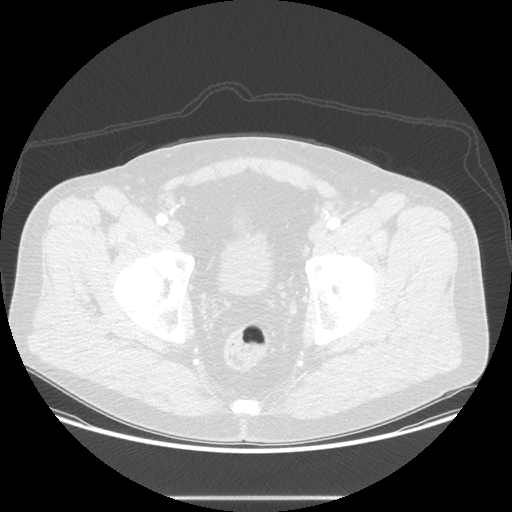
[im 41/203  bone]
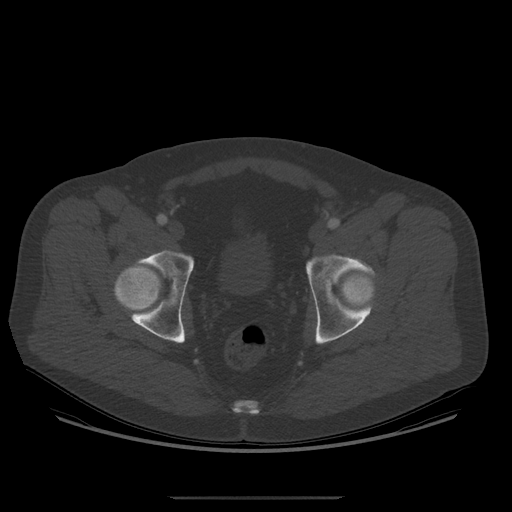
[im 81/203  soft-tissue]
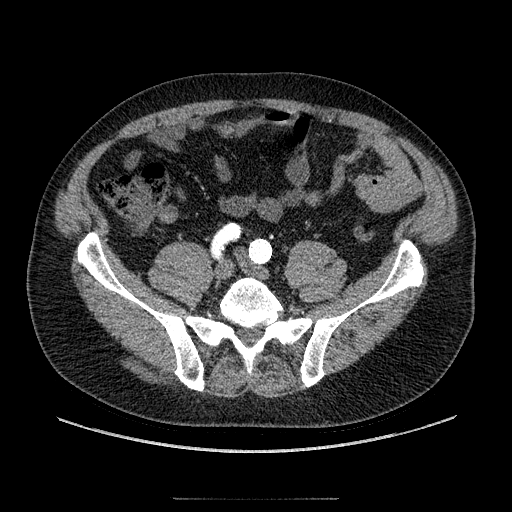
[im 81/203  lung]
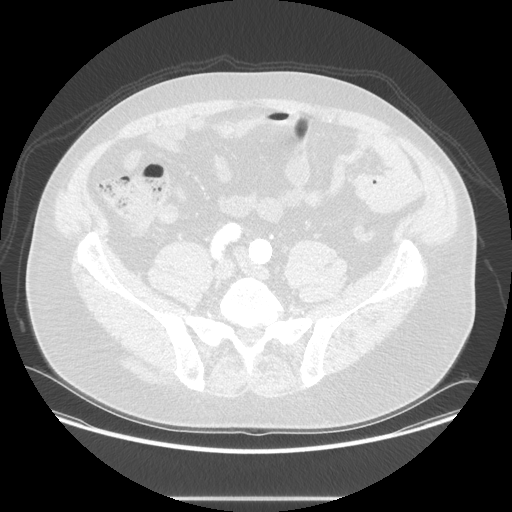
[im 122/203  soft-tissue]
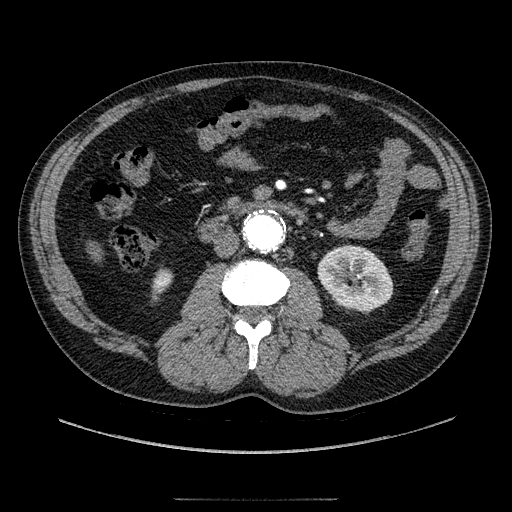
[im 122/203  lung]
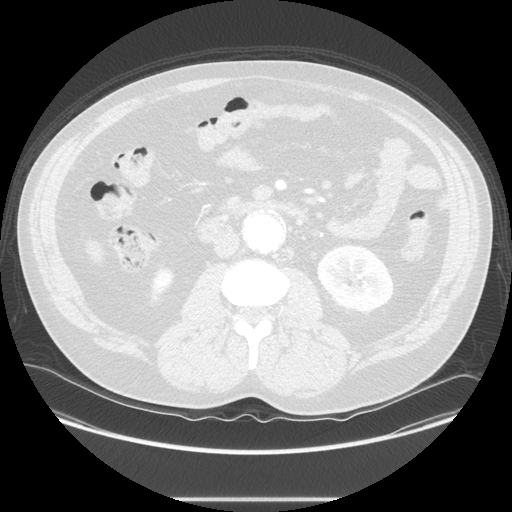
[im 162/203  soft-tissue]
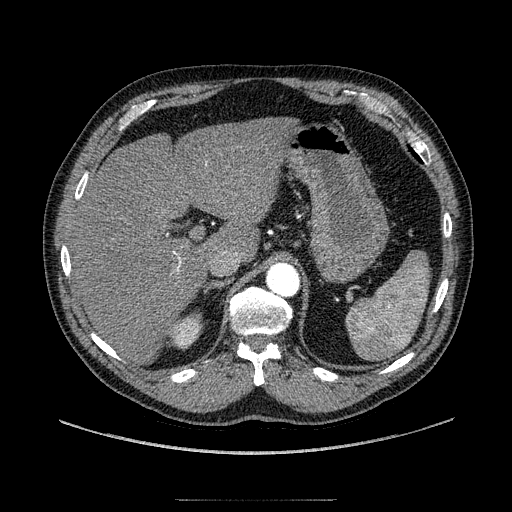
[im 162/203  lung]
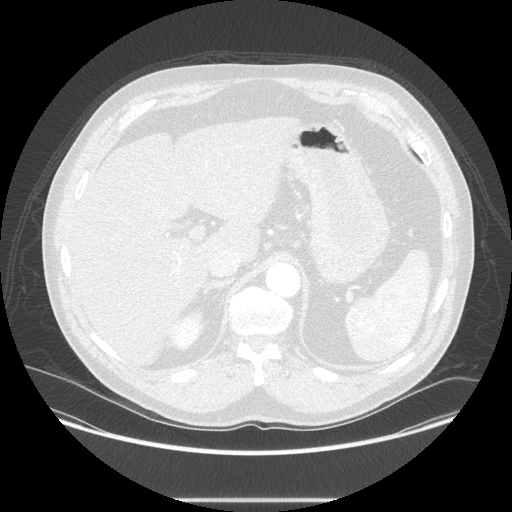

[Series 602: sagittal body · sagittal · 0.99mm/px · 3 of 166 slices shown (1 of 2)]
[im 42/166  soft-tissue]
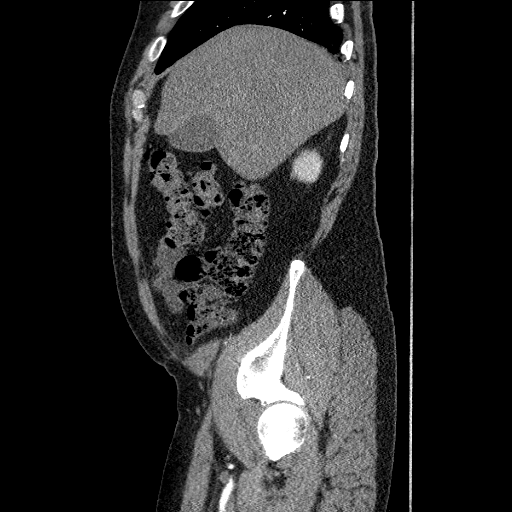
[im 83/166  soft-tissue]
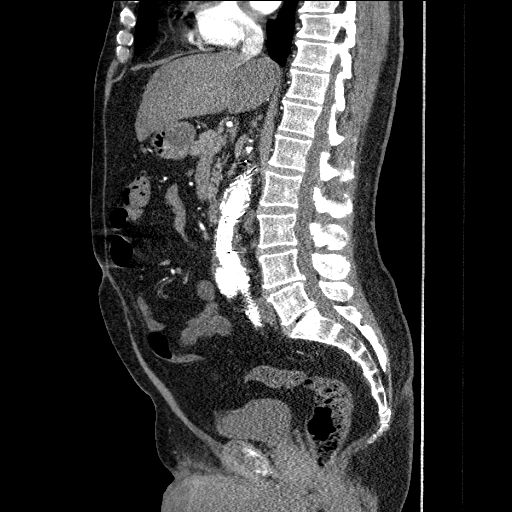
[im 124/166  soft-tissue]
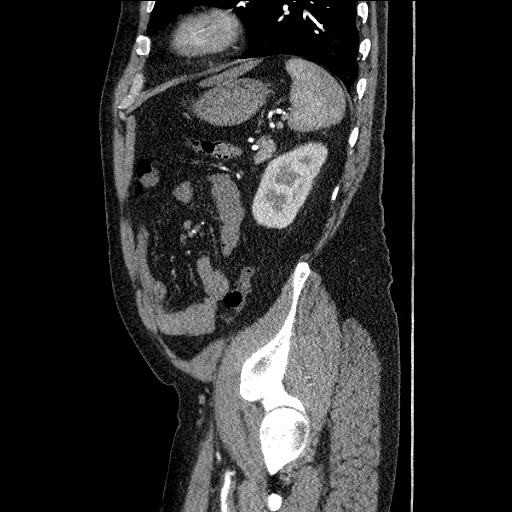

[Series 607: sagittal body · sagittal · 0.84mm/px · 3 of 162 slices shown (2 of 2)]
[im 41/162  soft-tissue]
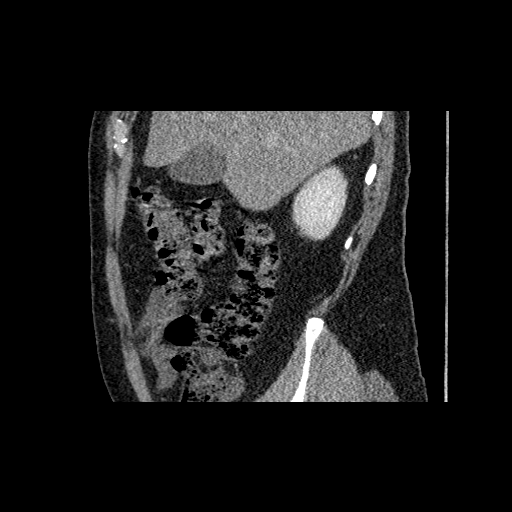
[im 81/162  soft-tissue]
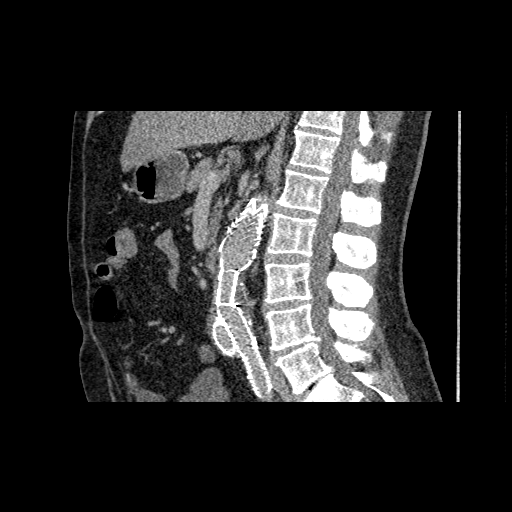
[im 121/162  soft-tissue]
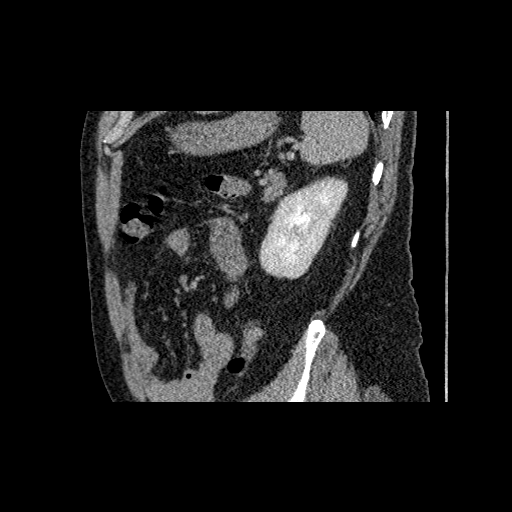

[10 of 36 positions shown; findings below may reference images not displayed]

FINDINGS: ARTERIAL FINDINGS:

Aorta: Infrarenal aortic stent graft remains patent and in stable
position. Again noted is minimal thrombus along the anterior aspect
of the main graft. The aneurysm sac has decreased in size, measuring
4.0 x 3.1 cm on sequence 5, image 104 and previously measured 4.3 x
3.7 cm. There is no evidence for an endoleak.

Celiac axis: Celiac trunk and main branch vessels are patent.

Superior mesenteric: SMA is patent.

Left renal:          Patent without stenosis.

Right renal: The right renal artery is patent with an early branch
vessel. There is mild atherosclerotic disease in the main right
renal artery.

Inferior mesenteric: Origin is occluded but there is reconstitution
of the IMA branches.

Left iliac: The graft extends into the distal left common iliac
artery. The left internal and external iliac arteries are patent.
Proximal femoral arteries are patent with mild disease.

Right iliac: Graft terminates in the distal right common iliac
artery. The right internal and external iliac arteries are patent.
Proximal right femoral arteries are patent.

Venous findings: Portal venous system is patent. IVC and renal veins
are patent.

Review of the MIP images confirms the above findings.

NONVASCULAR FINDINGS:

Stable 4 mm peripheral nodule in the right middle lobe on sequence
9, image 4. Stable 4 mm nodule in the posterior medial right lower
lobe on sequence 9, image 5. Question a new 4 mm nodule in the right
lower lobe on image 7. Stable 4 mm nodule in left lower lobe on
image 10. No pleural effusions. No acute abnormality to the liver,
gallbladder pancreas, spleen or adrenal glands. No acute abnormality
to the kidneys. No acute abnormality to the stomach, small bowel or
colon. No acute abnormality to the prostate and urinary bladder. No
evidence for free fluid or lymphadenopathy. Disc space narrowing at
L5-S1.
IMPRESSION: Endovascular repair of the abdominal aortic aneurysm. The aneurysm
sac continues to decrease in size and there is no evidence for an
endoleak.

Again noted are stable small pulmonary nodules at the lung bases.
There is a questionable new nodule in the right lower lobe,
measuring 4 mm. If the patient is at high risk for bronchogenic
carcinoma, follow-up chest CT at 1 year is recommended. If the
patient is at low risk, no follow-up is needed. This recommendation
follows the consensus statement: Guidelines for Management of Small
Pulmonary Nodules Detected on CT Scans: A Statement from the

No acute abnormality in the abdomen or pelvis.

## 2015-11-06 MED ORDER — IOPAMIDOL (ISOVUE-370) INJECTION 76%
75.0000 mL | Freq: Once | INTRAVENOUS | Status: AC | PRN
  Administered 2015-11-06: 75 mL via INTRAVENOUS

## 2015-11-06 NOTE — Progress Notes (Signed)
Subjective:     Patient ID: Aaron [name], male   DOB: Jan 14, 1956, 59 y.o.   MRN: [id number]  HPI this 59 year old male returns 3 years post endovascular stent graft repair of abdominal aortic aneurysm. He has done very well. He has no abdominal or back symptoms that he relates. He has quit smoking 3 years ago and has not resumed. He denies any claudication symptoms. He takes one aspirin per day.  Past Medical History  Diagnosis Date  . Hyperlipidemia   . Gout   . Rash 2011    left chest, biopsy 2012 Dr. Rozann Lesches, results pending  . Dyspnea     normal PFT April 2012  . Tobacco abuse   . Pneumonia     hx of and hx of bronchitis  . GERD (gastroesophageal reflux disease)   . H/O hiatal hernia   . Arthritis     lower back  . Asthma     followed by Dr. Berdie Ogren  . HTN (hypertension)     hx of sees Dr. Graylin Shiver  . Abdominal aortic aneurysm [facility])     sees Dr. Einar Gip for cardiac f/u, [phone]  . Other abnormal blood chemistry   . Insomnia, unspecified   . External hemorrhoids without mention of complication   . Other testicular dysfunction   . Other specified erythematous condition(695.89)   . Other dyspnea and respiratory abnormality   . Prostatitis, unspecified   . Other malaise and fatigue   . Chronic airway obstruction, not elsewhere classified   . Skin cancer     Social History  Substance Use Topics  . Smoking status: Former Smoker -- 0.20 packs/day for 52.5 years    Types: Cigarettes    Quit date: 09/02/2012  . Smokeless tobacco: Former Systems developer    Types: Snuff  . Alcohol Use: No     Comment: past ETOH use quit 11-26-2009    Family History  Problem Relation Age of Onset  . Heart attack Father 52  . COPD Father   . Heart disease Father   . Asthma Daughter   . Cancer Mother     Breast  . Lupus Daughter   . Diabetes Other     Grandson     No Known Allergies   Current outpatient prescriptions:  .  ACTICLATE 75 MG TABS, Take 1 tablet by mouth 2 (two) times  daily., Disp: , Rfl:  .  albuterol (PROVENTIL HFA;VENTOLIN HFA) 108 (90 BASE) MCG/ACT inhaler, Inhale 2 puffs into the lungs every 6 (six) hours as needed. For cough and wheezing., Disp: 24 g, Rfl: 0 .  aspirin EC 81 MG tablet, Take 81 mg by mouth daily., Disp: , Rfl:  .  losartan (COZAAR) 50 MG tablet, TAKE ONE TABLET BY MOUTH ONCE DAILY TO CONTROL BLOOD PRESSURE, Disp: 90 tablet, Rfl: 1 .  mometasone-formoterol (DULERA) 200-5 MCG/ACT AERO, Inhale 2 puffs into the lungs 2 (two) times daily. (Patient taking differently: Inhale 1 puff into the lungs daily. ), Disp: 39 g, Rfl: 1 .  pantoprazole (PROTONIX) 20 MG tablet, TAKE 1 TABLET (20 MG TOTAL) BY MOUTH DAILY., Disp: 90 tablet, Rfl: 1 .  sildenafil (VIAGRA) 50 MG tablet, Take 1 tablet (50 mg total) by mouth daily as needed for erectile dysfunction., Disp: 10 tablet, Rfl: 0 .  simvastatin (ZOCOR) 20 MG tablet, TAKE 1 TABLET BY MOUTH AT BEDTIME, Disp: 90 tablet, Rfl: 0  Filed Vitals:   11/06/15 1119  BP: 120/79  Pulse: 57  Temp: 96.9 F (36.1  C)  Resp: 14  Height: 6\' 2"  (1.88 m)  Weight: 236 lb (107.049 kg)  SpO2: 97%    Body mass index is 30.29 kg/(m^2).           Review of Systems has history of GERD, gout, denies chest pain, dyspnea on exertion, PND, orthopnea, hemoptysis. Denies claudication symptoms. Other systems negative and complete review of systems    Objective:   Physical Exam BP 120/79 mmHg  Pulse 57  Temp(Src) 96.9 F (36.1 C)  Resp 14  Ht 6\' 2"  (1.88 m)  Wt 236 lb (107.049 kg)  BMI 30.29 kg/m2  SpO2 97%  Gen.-alert and oriented x3 in no apparent distress HEENT normal for age Lungs no rhonchi or wheezing Cardiovascular regular rhythm no murmurs carotid pulses 3+ palpable no bruits audible Abdomen soft nontender no palpable masses Musculoskeletal free of  major deformities Skin clear -no rashes Neurologic normal Lower extremities 3+ femoral and posterior tibial pulses palpable bilaterally with no  edema  Today I ordered a CT angiogram of the abdomen and pelvis chart reviewed by computer and also read the report from the radiologist. Patient has a stent graft which is in excellent position with no evidence of endoleak or migration. The aneurysm sac has contracted nicely around the graft measuring approximately 3 cm in diameter.       Assessment:     Nicely functioning aortic stent graft 3 years post repair with no evidence of endoleak and good contraction of aneurysm sac    Plan:     Will return in 1 year and see nurse practitioner with duplex scan in our office of stent graft repair of abdominal aortic aneurysm

## 2015-11-06 NOTE — Addendum Note (Signed)
Addended by: Dorthula Rue L on: 11/06/2015 01:54 PM   Modules accepted: Orders

## 2015-11-08 ENCOUNTER — Other Ambulatory Visit: Payer: Self-pay | Admitting: Nurse Practitioner

## 2015-11-24 ENCOUNTER — Other Ambulatory Visit: Payer: Self-pay | Admitting: Nurse Practitioner

## 2015-12-17 ENCOUNTER — Other Ambulatory Visit: Payer: 59

## 2015-12-17 DIAGNOSIS — E785 Hyperlipidemia, unspecified: Secondary | ICD-10-CM

## 2015-12-17 DIAGNOSIS — Z125 Encounter for screening for malignant neoplasm of prostate: Secondary | ICD-10-CM

## 2015-12-17 NOTE — Addendum Note (Signed)
Addended by: Darliss Ridgel D on: 12/17/2015 08:27 AM   Modules accepted: Orders

## 2015-12-18 LAB — LIPID PANEL
CHOLESTEROL TOTAL: 115 mg/dL (ref 100–199)
Chol/HDL Ratio: 3.2 ratio units (ref 0.0–5.0)
HDL: 36 mg/dL — ABNORMAL LOW (ref >39)
LDL CALC: 61 mg/dL (ref 0–99)
Triglycerides: 90 mg/dL (ref 0–149)
VLDL CHOLESTEROL CAL: 18 mg/dL (ref 5–40)

## 2015-12-18 LAB — PSA: Prostate Specific Ag, Serum: 0.3 ng/mL (ref 0.0–4.0)

## 2015-12-20 ENCOUNTER — Ambulatory Visit (INDEPENDENT_AMBULATORY_CARE_PROVIDER_SITE_OTHER): Payer: BLUE CROSS/BLUE SHIELD | Admitting: Nurse Practitioner

## 2015-12-20 ENCOUNTER — Encounter: Payer: Self-pay | Admitting: Nurse Practitioner

## 2015-12-20 VITALS — BP 110/78 | HR 70 | Temp 97.7°F | Resp 20 | Ht 74.0 in | Wt 236.6 lb

## 2015-12-20 DIAGNOSIS — Z0000 Encounter for general adult medical examination without abnormal findings: Secondary | ICD-10-CM

## 2015-12-20 NOTE — Patient Instructions (Signed)

## 2015-12-20 NOTE — Progress Notes (Signed)
Patient ID: Aaron [name], male   DOB: 20-Jun-1956, 60 y.o.   MRN: [id number]    PCP: [name], NP  Advanced Directive information Does patient have an advance directive?: No, Would patient like information on creating an advanced directive?: No - patient declined information  No Known Allergies  Chief Complaint  Patient presents with  . Annual Exam    HPI: Patient is a 60 y.o. male seen in the office today for annual exams.  Pt reports he has been having hemorrhoids. Using OTC with good results. Following with vascular due to hx of AAA    Screenings: Colon Cancer- 2010- every 10 years Prostate Cancer- PSA 0.3, no changes to urination or stream.   Depression screening Depression screen [facility] 2/9 12/12/2014 08/08/2014 06/07/2013  Decreased Interest - 0 0  Down, Depressed, Hopeless 0 0 0  PHQ - 2 Score 0 0 0   Falls Fall Risk  12/20/2015 08/09/2015 12/12/2014 08/08/2014 06/07/2013  Falls in the past year? No No No No No  Vaccines Immunization History  Administered Date(s) Administered  . Influenza Split 09/20/2012  . Influenza,inj,Quad PF,36+ Mos 09/29/2013, 08/08/2014, 08/09/2015  . Pneumococcal Conjugate-13 12/12/2014, 12/12/2014  . Pneumococcal Polysaccharide-23 06/30/2011  . Tdap 12/06/2013    Smoking status:.previous smoker Alcohol use: none, 8 years sober  Dentist: every 4 months  Ophthalmologist: yearly  Exercise regimen: walk 2 miles a day.  Diet: no diet restrictions.   Review of Systems:  Review of Systems  Constitutional: Negative for activity change, appetite change, fatigue and unexpected weight change.  HENT: Negative for congestion and hearing loss.   Eyes: Negative.   Respiratory: Negative for cough and shortness of breath.   Cardiovascular: Negative for chest pain, palpitations and leg swelling.  Gastrointestinal: Negative for abdominal pain, diarrhea and constipation.  Genitourinary: Negative for dysuria and difficulty urinating.    Musculoskeletal: Negative for myalgias and arthralgias.  Skin: Negative for color change and wound.  Neurological: Negative for dizziness, weakness, light-headedness and numbness.  Psychiatric/Behavioral: Negative for behavioral problems, confusion and agitation.    Past Medical History  Diagnosis Date  . Hyperlipidemia   . Gout   . Rash 2011    left chest, biopsy 2012 Dr. Rozann Lesches, results pending  . Dyspnea     normal PFT April 2012  . Tobacco abuse   . Pneumonia     hx of and hx of bronchitis  . GERD (gastroesophageal reflux disease)   . H/O hiatal hernia   . Arthritis     lower back  . Asthma     followed by Dr. Berdie Ogren  . HTN (hypertension)     hx of sees Dr. Graylin Shiver  . Abdominal aortic aneurysm [facility])     sees Dr. Einar Gip for cardiac f/u, [phone]  . Other abnormal blood chemistry   . Insomnia, unspecified   . External hemorrhoids without mention of complication   . Other testicular dysfunction   . Other specified erythematous condition(695.89)   . Other dyspnea and respiratory abnormality   . Prostatitis, unspecified   . Other malaise and fatigue   . Chronic airway obstruction, not elsewhere classified   . Skin cancer    Past Surgical History  Procedure Laterality Date  . Appendectomy    . Tonsillectomy    . Wrist surgery      left  . Toe surgery  01/2012    joint of left great toe  . Tympanoplasty    . Abdominal aortic endovascular stent graft  09/22/2012    Procedure: ABDOMINAL AORTIC ENDOVASCULAR STENT GRAFT;  Surgeon: Mal Misty, MD;  Location: [place];  Service: Vascular;  Laterality: N/A;  GORE; ultrasound guided.  . Skin cancer excision     Social History:   reports that he quit smoking about 3 years ago. His smoking use included Cigarettes. He has a 10.5 pack-year smoking history. He has quit using smokeless tobacco. His smokeless tobacco use included Snuff. He reports that he does not drink alcohol or use illicit drugs.  Family  History  Problem Relation Age of Onset  . Heart attack Father 31  . COPD Father   . Heart disease Father   . Asthma Daughter   . Cancer Mother     Breast  . Lupus Daughter   . Diabetes Other     Grandson     Medications: Patient's Medications  New Prescriptions   No medications on file  Previous Medications   ACTICLATE 75 MG TABS    Take 1 tablet by mouth 2 (two) times daily.   ALBUTEROL (PROVENTIL HFA;VENTOLIN HFA) 108 (90 BASE) MCG/ACT INHALER    Inhale 2 puffs into the lungs every 6 (six) hours as needed. For cough and wheezing.   ASPIRIN EC 81 MG TABLET    Take 81 mg by mouth daily.   DULERA 200-5 MCG/ACT AERO    INHALE 2 PUFFS INTO THE LUNGS 2 (TWO) TIMES DAILY.   LOSARTAN (COZAAR) 50 MG TABLET    TAKE ONE TABLET BY MOUTH ONCE DAILY TO CONTROL BLOOD PRESSURE   PANTOPRAZOLE (PROTONIX) 20 MG TABLET    TAKE 1 TABLET (20 MG TOTAL) BY MOUTH DAILY.   SILDENAFIL (VIAGRA) 50 MG TABLET    Take 1 tablet (50 mg total) by mouth daily as needed for erectile dysfunction.   SIMVASTATIN (ZOCOR) 20 MG TABLET    TAKE 1 TABLET BY MOUTH AT BEDTIME  Modified Medications   No medications on file  Discontinued Medications   No medications on file     Physical Exam:  Filed Vitals:   12/20/15 1053  BP: 110/78  Pulse: 70  Temp: 97.7 F (36.5 C)  TempSrc: Oral  Resp: 20  Height: 6\' 2"  (1.88 m)  Weight: 236 lb 9.6 oz (107.321 kg)  SpO2: 97%   Body mass index is 30.36 kg/(m^2).  Physical Exam  Constitutional: He is oriented to person, place, and time. He appears well-developed and well-nourished. No distress.  HENT:  Head: Normocephalic and atraumatic.  Right Ear: External ear normal.  Left Ear: External ear normal.  Nose: Nose normal.  Mouth/Throat: Oropharynx is clear and moist. No oropharyngeal exudate.  Eyes: Conjunctivae and EOM are normal. Pupils are equal, round, and reactive to light.  Neck: Normal range of motion. Neck supple.  Cardiovascular: Normal rate, regular rhythm  and normal heart sounds.   Pulmonary/Chest: Effort normal and breath sounds normal.  Abdominal: Soft. Bowel sounds are normal.  Genitourinary: Rectum normal and prostate normal.  Musculoskeletal: Normal range of motion. He exhibits no edema or tenderness.  Neurological: He is alert and oriented to person, place, and time. He has normal reflexes.  Skin: Skin is warm and dry. He is not diaphoretic.  Psychiatric: He has a normal mood and affect.    Labs reviewed: Basic Metabolic Panel:  Recent Labs  08/09/15 1642  NA 141  K 4.4  CL 102  CO2 24  GLUCOSE 91  BUN 13  CREATININE 0.97  CALCIUM 9.3   Liver Function  Tests:  Recent Labs  08/09/15 1642  AST 26  ALT 32  ALKPHOS 92  BILITOT 0.3  PROT 6.9  ALBUMIN 4.3   No results for input(s): LIPASE, AMYLASE in the last 8760 hours. No results for input(s): AMMONIA in the last 8760 hours. CBC:  Recent Labs  08/09/15 1642  WBC 6.8  NEUTROABS 3.7  HCT 41.3  MCV 87  PLT 270   Lipid Panel:  Recent Labs  12/17/15 0815  CHOL 115  HDL 36*  LDLCALC 61  TRIG 90  CHOLHDL 3.2   TSH: No results for input(s): TSH in the last 8760 hours. A1C: Lab Results  Component Value Date   HGBA1C 6.0* 08/09/2015     Assessment/Plan 1. Wellness examination  The patient is doing well and no distinct problems were identified on exam. Labs reviewed with pt. The patient was counseled regarding the appropriate use of alcohol,prevention of dental and periodontal disease, diet, regular sustained exercise for at least 30 minutes 5 times per week, testicular self-examination on a monthly basis,smoking cessation, tobacco use,  and recommended schedule for GI hemoccult testing, colonoscopy, cholesterol, thyroid and diabetes screening. -heart healthy diet encouraged  Follow up in 6 months for routine follow up.      Carlos American. Harle Battiest  [facility] [phone] 8 am - 5 pm) [phone]  (after hours)

## 2016-02-01 ENCOUNTER — Other Ambulatory Visit: Payer: Self-pay | Admitting: Nurse Practitioner

## 2016-02-08 ENCOUNTER — Encounter: Payer: Self-pay | Admitting: Gastroenterology

## 2016-02-22 ENCOUNTER — Other Ambulatory Visit: Payer: Self-pay | Admitting: Nurse Practitioner

## 2016-04-17 ENCOUNTER — Other Ambulatory Visit: Payer: Self-pay | Admitting: Nurse Practitioner

## 2016-05-30 ENCOUNTER — Telehealth: Payer: Self-pay

## 2016-05-30 NOTE — Telephone Encounter (Signed)
Patient called to see if we had samples of Losartan. Patient informed medication is generic and we do not sample. Patient states he can get a 90 day supply for 20 dollars and he will go pick rx up. Patient in between jobs at the moment

## 2016-06-19 ENCOUNTER — Encounter: Payer: Self-pay | Admitting: Nurse Practitioner

## 2016-06-19 ENCOUNTER — Ambulatory Visit (INDEPENDENT_AMBULATORY_CARE_PROVIDER_SITE_OTHER): Payer: 59 | Admitting: Nurse Practitioner

## 2016-06-19 VITALS — BP 138/90 | HR 68 | Temp 98.0°F | Resp 18 | Ht 74.0 in | Wt 238.4 lb

## 2016-06-19 DIAGNOSIS — N529 Male erectile dysfunction, unspecified: Secondary | ICD-10-CM

## 2016-06-19 DIAGNOSIS — J4540 Moderate persistent asthma, uncomplicated: Secondary | ICD-10-CM

## 2016-06-19 DIAGNOSIS — E785 Hyperlipidemia, unspecified: Secondary | ICD-10-CM

## 2016-06-19 DIAGNOSIS — Z720 Tobacco use: Secondary | ICD-10-CM

## 2016-06-19 DIAGNOSIS — I714 Abdominal aortic aneurysm, without rupture, unspecified: Secondary | ICD-10-CM

## 2016-06-19 DIAGNOSIS — R7303 Prediabetes: Secondary | ICD-10-CM

## 2016-06-19 DIAGNOSIS — K219 Gastro-esophageal reflux disease without esophagitis: Secondary | ICD-10-CM

## 2016-06-19 DIAGNOSIS — Z87891 Personal history of nicotine dependence: Secondary | ICD-10-CM

## 2016-06-19 DIAGNOSIS — I10 Essential (primary) hypertension: Secondary | ICD-10-CM

## 2016-06-19 LAB — COMPLETE METABOLIC PANEL WITH GFR
ALBUMIN: 4.2 g/dL (ref 3.6–5.1)
ALK PHOS: 85 U/L (ref 40–115)
ALT: 29 U/L (ref 9–46)
AST: 25 U/L (ref 10–35)
BUN: 14 mg/dL (ref 7–25)
CALCIUM: 9.2 mg/dL (ref 8.6–10.3)
CO2: 26 mmol/L (ref 20–31)
CREATININE: 1.07 mg/dL (ref 0.70–1.33)
Chloride: 105 mmol/L (ref 98–110)
GFR, Est African American: 89 mL/min (ref >=60)
GFR, Est Non African American: 77 mL/min (ref >=60)
Glucose, Bld: 108 mg/dL — ABNORMAL HIGH (ref 65–99)
Potassium: 4.4 mmol/L (ref 3.5–5.3)
Sodium: 139 mmol/L (ref 135–146)
TOTAL PROTEIN: 6.9 g/dL (ref 6.1–8.1)
Total Bilirubin: 0.4 mg/dL (ref 0.2–1.2)

## 2016-06-19 MED ORDER — PANTOPRAZOLE SODIUM 20 MG PO TBEC: DELAYED_RELEASE_TABLET | ORAL | 1 refills | Status: DC

## 2016-06-19 MED ORDER — SILDENAFIL CITRATE 50 MG PO TABS: 50.0000 mg | ORAL_TABLET | Freq: Every day | ORAL | 2 refills | Status: DC | PRN

## 2016-06-19 MED ORDER — SIMVASTATIN 20 MG PO TABS: 20.0000 mg | ORAL_TABLET | Freq: Every day | ORAL | 1 refills | Status: DC

## 2016-06-19 MED ORDER — MOMETASONE FURO-FORMOTEROL FUM 200-5 MCG/ACT IN AERO: INHALATION_SPRAY | RESPIRATORY_TRACT | 1 refills | Status: DC

## 2016-06-19 MED ORDER — LOSARTAN POTASSIUM 50 MG PO TABS: ORAL_TABLET | ORAL | 1 refills | Status: DC

## 2016-06-19 NOTE — Patient Instructions (Signed)
We do not have samples of dulera- contact pulmonary office to see if they might have some  You will need screening CT lungs due to hx of smoking when you have insurance, please call us and let us  Know so we can order

## 2016-06-19 NOTE — Progress Notes (Signed)
PCP: [name], NP  Advanced Directive information Does patient have an advance directive?: No, Would patient like information on creating an advanced directive?: No - patient declined information  No Known Allergies  Chief Complaint  Patient presents with  . Medical Management of Chronic Issues    6 month follow up     HPI: Patient is a 60 y.o. male seen in the office today for routine follow up on chronic conditions. Pt with hx of ED, htn, COPD, GERD, hyperlipidemia.  Using good Rx card and [email or web address] to get prescriptions.  Taking medications as prescribed Attempting low sodium diet.  Pt has lost insurance and requesting Viagra samples or samples of any medication  Without concerns today Review of Systems:  Review of Systems  Constitutional: Negative for activity change, appetite change, fatigue and unexpected weight change.  HENT: Negative for congestion and hearing loss.   Eyes: Negative.   Respiratory: Negative for cough and shortness of breath.   Cardiovascular: Negative for chest pain, palpitations and leg swelling.  Gastrointestinal: Negative for abdominal pain, constipation and diarrhea.  Genitourinary: Negative for difficulty urinating and dysuria.  Musculoskeletal: Negative for arthralgias and myalgias.  Skin: Negative for color change and wound.  Neurological: Negative for dizziness, weakness, light-headedness and numbness.  Psychiatric/Behavioral: Negative for agitation, behavioral problems and confusion.    Past Medical History:  Diagnosis Date  . Abdominal aortic aneurysm [facility])    sees Dr. Einar Gip for cardiac f/u, [phone]  . Arthritis    lower back  . Asthma    followed by Dr. Berdie Ogren  . Chronic airway obstruction, not elsewhere classified   . Dyspnea    normal PFT April 2012  . External hemorrhoids without mention of complication   . GERD (gastroesophageal reflux disease)   . Gout   . H/O hiatal hernia   . HTN (hypertension)     hx of sees Dr. Graylin Shiver  . Hyperlipidemia   . Insomnia, unspecified   . Other abnormal blood chemistry   . Other dyspnea and respiratory abnormality   . Other malaise and fatigue   . Other specified erythematous condition(695.89)   . Other testicular dysfunction   . Pneumonia    hx of and hx of bronchitis  . Prostatitis, unspecified   . Rash 2011   left chest, biopsy 2012 Dr. Rozann Lesches, results pending  . Skin cancer   . Tobacco abuse    Past Surgical History:  Procedure Laterality Date  . ABDOMINAL AORTIC ENDOVASCULAR STENT GRAFT  09/22/2012   Procedure: ABDOMINAL AORTIC ENDOVASCULAR STENT GRAFT;  Surgeon: Mal Misty, MD;  Location: [place];  Service: Vascular;  Laterality: N/A;  GORE; ultrasound guided.  . APPENDECTOMY    . SKIN CANCER EXCISION    . TOE SURGERY  01/2012   joint of left great toe  . TONSILLECTOMY    . TYMPANOPLASTY    . WRIST SURGERY     left   Social History:   reports that he quit smoking about 3 years ago. His smoking use included Cigarettes. He has a 10.50 pack-year smoking history. He has quit using smokeless tobacco. His smokeless tobacco use included Snuff. He reports that he does not drink alcohol or use drugs.  Family History  Problem Relation Age of Onset  . Heart attack Father 98  . COPD Father   . Heart disease Father   . Asthma Daughter   . Cancer Mother     Breast  . Lupus Daughter   .  Diabetes Other     Grandson     Medications: Patient's Medications  New Prescriptions   No medications on file  Previous Medications   ALBUTEROL (PROVENTIL HFA;VENTOLIN HFA) 108 (90 BASE) MCG/ACT INHALER    Inhale 2 puffs into the lungs every 6 (six) hours as needed. For cough and wheezing.   ASPIRIN EC 81 MG TABLET    Take 81 mg by mouth daily.   DULERA 200-5 MCG/ACT AERO    INHALE 2 PUFFS INTO THE LUNGS 2 (TWO) TIMES DAILY.   LOSARTAN (COZAAR) 50 MG TABLET    TAKE ONE TABLET BY MOUTH ONCE DAILY TO CONTROL BLOOD PRESSURE   PANTOPRAZOLE (PROTONIX)  20 MG TABLET    TAKE 1 TABLET (20 MG TOTAL) BY MOUTH DAILY.   SILDENAFIL (VIAGRA) 50 MG TABLET    Take 1 tablet (50 mg total) by mouth daily as needed for erectile dysfunction.   SIMVASTATIN (ZOCOR) 20 MG TABLET    TAKE 1 TABLET BY MOUTH AT BEDTIME  Modified Medications   No medications on file  Discontinued Medications   ACTICLATE 75 MG TABS    Take 1 tablet by mouth 2 (two) times daily.     Physical Exam:  Vitals:   06/19/16 0842  BP: 138/90  Pulse: 68  Resp: 18  Temp: 98 F (36.7 C)  TempSrc: Oral  SpO2: 96%  Weight: 238 lb 6.4 oz (108.1 kg)  Height: 6\' 2"  (1.88 m)   Body mass index is 30.61 kg/m.  Physical Exam  Constitutional: He is oriented to person, place, and time. He appears well-developed and well-nourished. No distress.  HENT:  Head: Normocephalic and atraumatic.  Mouth/Throat: Oropharynx is clear and moist. No oropharyngeal exudate.  Eyes: Conjunctivae and EOM are normal. Pupils are equal, round, and reactive to light.  Neck: Normal range of motion. Neck supple.  Cardiovascular: Normal rate, regular rhythm and normal heart sounds.   Pulmonary/Chest: Effort normal and breath sounds normal.  Abdominal: Soft. Bowel sounds are normal.  Musculoskeletal: He exhibits no edema or tenderness.  Neurological: He is alert and oriented to person, place, and time.  Skin: Skin is warm and dry. He is not diaphoretic.  Psychiatric: He has a normal mood and affect.    Labs reviewed: Basic Metabolic Panel:  Recent Labs  08/09/15 1642  NA 141  K 4.4  CL 102  CO2 24  GLUCOSE 91  BUN 13  CREATININE 0.97  CALCIUM 9.3   Liver Function Tests:  Recent Labs  08/09/15 1642  AST 26  ALT 32  ALKPHOS 92  BILITOT 0.3  PROT 6.9  ALBUMIN 4.3   No results for input(s): LIPASE, AMYLASE in the last 8760 hours. No results for input(s): AMMONIA in the last 8760 hours. CBC:  Recent Labs  08/09/15 1642  WBC 6.8  NEUTROABS 3.7  HCT 41.3  MCV 87  PLT 270   Lipid  Panel:  Recent Labs  12/17/15 0815  CHOL 115  HDL 36*  LDLCALC 61  TRIG 90  CHOLHDL 3.2   TSH: No results for input(s): TSH in the last 8760 hours. A1C: Lab Results  Component Value Date   HGBA1C 6.0 (H) 08/09/2015     Assessment/Plan 1. AAA (abdominal aortic aneurysm) without rupture [facility]) S/p repair Following with vascular yearly for follow up.  2. Essential hypertension Stable, to cont cozaar and low sodium diet  - COMPLETE METABOLIC PANEL WITH GFR - losartan (COZAAR) 50 MG tablet; TAKE ONE TABLET BY MOUTH  ONCE DAILY TO CONTROL BLOOD PRESSURE  Dispense: 90 tablet; Refill: 1  3. Asthma, moderate persistent, uncomplicated -stable, conts to not smoke - mometasone-formoterol (DULERA) 200-5 MCG/ACT AERO; INHALE 2 PUFFS INTO THE LUNGS 2 (TWO) TIMES DAILY.  Dispense: 39 Inhaler; Refill: 1  4. Gastroesophageal reflux disease, esophagitis presence not specified -controlled on current medication and lifestyle modification  - pantoprazole (PROTONIX) 20 MG tablet; TAKE 1 TABLET (20 MG TOTAL) BY MOUTH DAILY.  Dispense: 90 tablet; Refill: 1  5. Hx of smoking -has cont to not smoke, qualifies for yearly CT scan due to smoking history, however pt does not have insurance at this time. Will notify us when he gets insurance so we can order CT scan  6. Prediabetes -to cont lifestyle modification, will follow up labs, pt fasting today - COMPLETE METABOLIC PANEL WITH GFR  7. Hyperlipidemia -to continue lifestyle modifications along with medication for control  -LDL at goal in January - COMPLETE METABOLIC PANEL WITH GFR - simvastatin (ZOCOR) 20 MG tablet; Take 1 tablet (20 mg total) by mouth at bedtime.  Dispense: 90 tablet; Refill: 1  8. Erectile dysfunction, unspecified erectile dysfunction type -coupon card given - sildenafil (VIAGRA) 50 MG tablet; Take 1 tablet (50 mg total) by mouth daily as needed for erectile dysfunction.  Dispense: 10 tablet; Refill: 2   Follow up in 6  months with Dr Thayer Headings K. Harle Battiest  [facility] [phone] 8 am - 5 pm) [phone] (after hours)

## 2016-07-17 ENCOUNTER — Encounter: Payer: Self-pay | Admitting: Nurse Practitioner

## 2016-07-17 ENCOUNTER — Ambulatory Visit (INDEPENDENT_AMBULATORY_CARE_PROVIDER_SITE_OTHER): Payer: Self-pay | Admitting: Nurse Practitioner

## 2016-07-17 VITALS — BP 110/76 | HR 62 | Temp 97.7°F | Ht 74.0 in | Wt 232.0 lb

## 2016-07-17 DIAGNOSIS — Z23 Encounter for immunization: Secondary | ICD-10-CM

## 2016-07-17 DIAGNOSIS — M25562 Pain in left knee: Secondary | ICD-10-CM

## 2016-07-17 NOTE — Patient Instructions (Signed)
Cont to use ice ~20 mins 3 times daily To use aleve 220 mg by mouth twice daily for 1 week Okay to use biofreeze between using ice Cont to just compression  Rest   Follow up if symptoms worsening or fail to improve after 10 days

## 2016-07-17 NOTE — Progress Notes (Signed)
Careteam: [name], NP as PCP - General (Nurse Practitioner) Blanchie Serve, MD as Attending Physician (Internal Medicine)  Advanced Directive information Does patient have an advance directive?: No, Would patient like information on creating an advanced directive?: No - patient declined information  No Known Allergies  Chief Complaint  Patient presents with  . Knee Injury    twisted knee x last friday     HPI: Patient is a 60 y.o. male seen in the office today due to knee pain for 1 week ago. Was playing golf and during game he noticed increase pain to left knee. No known injury, did not have single episode where he felt like he hurt knee but noticed increase pain with golf. Tended to it during the next few days and it started feeling better but concerned because its not 100% yet.  Able to walk without difficulty. Does not feel like its going to give out.  Was alternating ice and heat. Taking ibuprofen. Wife works at [employer], fast food and got him some biofreeze.   Review of Systems:  Review of Systems  Musculoskeletal: Negative for arthralgias, gait problem, joint swelling and myalgias.       Acute pain to left knee, see HPI  All other systems reviewed and are negative.  Past Medical History:  Diagnosis Date  . Abdominal aortic aneurysm [facility])    sees Dr. Einar Gip for cardiac f/u, [phone]  . Arthritis    lower back  . Asthma    followed by Dr. Berdie Ogren  . Chronic airway obstruction, not elsewhere classified   . Dyspnea    normal PFT April 2012  . External hemorrhoids without mention of complication   . GERD (gastroesophageal reflux disease)   . Gout   . H/O hiatal hernia   . HTN (hypertension)    hx of sees Dr. Graylin Shiver  . Hyperlipidemia   . Insomnia, unspecified   . Other abnormal blood chemistry   . Other dyspnea and respiratory abnormality   . Other malaise and fatigue   . Other specified erythematous condition(695.89)   . Other  testicular dysfunction   . Pneumonia    hx of and hx of bronchitis  . Prostatitis, unspecified   . Rash 2011   left chest, biopsy 2012 Dr. Rozann Lesches, results pending  . Skin cancer   . Tobacco abuse    Past Surgical History:  Procedure Laterality Date  . ABDOMINAL AORTIC ENDOVASCULAR STENT GRAFT  09/22/2012   Procedure: ABDOMINAL AORTIC ENDOVASCULAR STENT GRAFT;  Surgeon: Mal Misty, MD;  Location: [place];  Service: Vascular;  Laterality: N/A;  GORE; ultrasound guided.  . APPENDECTOMY    . SKIN CANCER EXCISION    . TOE SURGERY  01/2012   joint of left great toe  . TONSILLECTOMY    . TYMPANOPLASTY    . WRIST SURGERY     left   Social History:   reports that he quit smoking about 3 years ago. His smoking use included Cigarettes. He has a 10.50 pack-year smoking history. He has quit using smokeless tobacco. His smokeless tobacco use included Snuff. He reports that he does not drink alcohol or use drugs.  Family History  Problem Relation Age of Onset  . Heart attack Father 93  . COPD Father   . Heart disease Father   . Asthma Daughter   . Cancer Mother     Breast  . Lupus Daughter   . Diabetes Other  Grandson     Medications: Patient's Medications  New Prescriptions   No medications on file  Previous Medications   ALBUTEROL (PROVENTIL HFA;VENTOLIN HFA) 108 (90 BASE) MCG/ACT INHALER    Inhale 2 puffs into the lungs every 6 (six) hours as needed. For cough and wheezing.   ASPIRIN EC 81 MG TABLET    Take 81 mg by mouth daily.   LOSARTAN (COZAAR) 50 MG TABLET    TAKE ONE TABLET BY MOUTH ONCE DAILY TO CONTROL BLOOD PRESSURE   MOMETASONE-FORMOTEROL (DULERA) 200-5 MCG/ACT AERO    INHALE 2 PUFFS INTO THE LUNGS 2 (TWO) TIMES DAILY.   PANTOPRAZOLE (PROTONIX) 20 MG TABLET    TAKE 1 TABLET (20 MG TOTAL) BY MOUTH DAILY.   SILDENAFIL (VIAGRA) 50 MG TABLET    Take 1 tablet (50 mg total) by mouth daily as needed for erectile dysfunction.   SIMVASTATIN (ZOCOR) 20 MG TABLET    Take 1  tablet (20 mg total) by mouth at bedtime.  Modified Medications   No medications on file  Discontinued Medications   No medications on file     Physical Exam:  Vitals:   07/17/16 1052  BP: 110/76  Pulse: 62  Temp: 97.7 F (36.5 C)  TempSrc: Oral  SpO2: 96%  Weight: 232 lb (105.2 kg)  Height: 6\' 2"  (1.88 m)   Body mass index is 29.79 kg/m.  Physical Exam  Constitutional: He appears well-developed and well-nourished.  Musculoskeletal: He exhibits no edema.       Left knee: He exhibits normal range of motion, no swelling, no effusion, no ecchymosis and normal alignment. Tenderness (to medial aspect of knee) found.  Neurological: He is alert.  Skin: Skin is warm and dry. No erythema.    Labs reviewed: Basic Metabolic Panel:  Recent Labs  08/09/15 1642 06/19/16 0923  NA 141 139  K 4.4 4.4  CL 102 105  CO2 24 26  GLUCOSE 91 108*  BUN 13 14  CREATININE 0.97 1.07  CALCIUM 9.3 9.2   Liver Function Tests:  Recent Labs  08/09/15 1642 06/19/16 0923  AST 26 25  ALT 32 29  ALKPHOS 92 85  BILITOT 0.3 0.4  PROT 6.9 6.9  ALBUMIN 4.3 4.2   No results for input(s): LIPASE, AMYLASE in the last 8760 hours. No results for input(s): AMMONIA in the last 8760 hours. CBC:  Recent Labs  08/09/15 1642  WBC 6.8  NEUTROABS 3.7  HCT 41.3  MCV 87  PLT 270   Lipid Panel:  Recent Labs  12/17/15 0815  CHOL 115  HDL 36*  LDLCALC 61  TRIG 90  CHOLHDL 3.2   TSH: No results for input(s): TSH in the last 8760 hours. A1C: Lab Results  Component Value Date   HGBA1C 6.0 (H) 08/09/2015     Assessment/Plan 1. Encounter for immunization - Flu Vaccine QUAD 36+ mos IM  2. Knee pain, acute, left -improved, no joint instability, swelling, or redness - to cont to use ice ~20 mins 3 times daily To use aleve 220 mg by mouth twice daily for 1 week Okay to use biofreeze between using ice Cont to just compression and rest  Follow up if symptoms worsening or fail to  improve after 10 days    Merlie Noga K. Harle Battiest  [facility] [phone] 8 am - 5 pm) [phone] (after hours)

## 2016-09-04 ENCOUNTER — Telehealth: Payer: Self-pay

## 2016-09-04 NOTE — Telephone Encounter (Signed)
May use omeprazole (OTC) 20 mg daily

## 2016-09-04 NOTE — Telephone Encounter (Signed)
Patient called requesting an alternative to protonix. Protonix is too expensive.  Please advise

## 2016-09-05 NOTE — Telephone Encounter (Signed)
I called patient, patient verbalized understanding of recommendation. Medication list updated.  Patient lost his job and does not have insurance at the time. Patient hopes he will have insurance soon.

## 2016-09-17 ENCOUNTER — Ambulatory Visit (INDEPENDENT_AMBULATORY_CARE_PROVIDER_SITE_OTHER): Payer: Self-pay | Admitting: Internal Medicine

## 2016-09-17 ENCOUNTER — Encounter: Payer: Self-pay | Admitting: Internal Medicine

## 2016-09-17 DIAGNOSIS — R197 Diarrhea, unspecified: Secondary | ICD-10-CM | POA: Insufficient documentation

## 2016-09-17 DIAGNOSIS — J22 Unspecified acute lower respiratory infection: Secondary | ICD-10-CM

## 2016-09-17 MED ORDER — AZITHROMYCIN 250 MG PO TABS: ORAL_TABLET | ORAL | 0 refills | Status: DC

## 2016-09-17 MED ORDER — HYDROCODONE-ACETAMINOPHEN 7.5-325 MG/15ML PO SOLN: 10.0000 mL | Freq: Four times a day (QID) | ORAL | 0 refills | Status: DC | PRN

## 2016-09-17 MED ORDER — LOPERAMIDE HCL 2 MG PO TABS: ORAL_TABLET | ORAL | 0 refills | Status: DC

## 2016-09-17 NOTE — Progress Notes (Signed)
Facility  [place]    Place of Service:   OFFICE    No Known Allergies  Chief Complaint  Patient presents with  . Acute Visit    Cold symptoms: productive coung, runny nose, sneezing, and diarrhea.Onset Friday 09-12-16  . Polyuria    Frequent Urination x 2 days, no pain or discomfort     HPI:  Patient complains of having a bad cold for the last 5 days. He has a cough producing thick yellow sputum. He has been wheezing. He has not been febrile. This episode started with a sore throat, but that is doing better now. He has had some dyspnea. He has a long-standing history of asthma.   Patient has used Mucinex DM to help the cough as well as Delsym. These medications are helping some.   Had 4 loose stools since yesterday. No blood in stool. Painless.  Patient is also having some urinary frequency. There is no dysuria. He has been drinking extra fluids.    Medications: Patient's Medications  New Prescriptions   No medications on file  Previous Medications   ALBUTEROL (PROVENTIL HFA;VENTOLIN HFA) 108 (90 BASE) MCG/ACT INHALER    Inhale 2 puffs into the lungs every 6 (six) hours as needed. For cough and wheezing.   ASPIRIN EC 81 MG TABLET    Take 81 mg by mouth daily.   LOSARTAN (COZAAR) 50 MG TABLET    TAKE ONE TABLET BY MOUTH ONCE DAILY TO CONTROL BLOOD PRESSURE   MOMETASONE-FORMOTEROL (DULERA) 200-5 MCG/ACT AERO    INHALE 2 PUFFS INTO THE LUNGS 2 (TWO) TIMES DAILY.   OMEPRAZOLE (PRILOSEC OTC) 20 MG TABLET    Take 20 mg by mouth daily.   SILDENAFIL (VIAGRA) 50 MG TABLET    Take 1 tablet (50 mg total) by mouth daily as needed for erectile dysfunction.   SIMVASTATIN (ZOCOR) 20 MG TABLET    Take 1 tablet (20 mg total) by mouth at bedtime.  Modified Medications   No medications on file  Discontinued Medications   No medications on file    Review of Systems  Constitutional: Negative for activity change, appetite change, fatigue, fever and unexpected weight change.  HENT: Positive  for rhinorrhea. Negative for congestion, ear pain, hearing loss, sore throat, tinnitus, trouble swallowing and voice change.        Sneezing  Eyes:       Corrective lenses  Respiratory: Positive for cough. Negative for choking, chest tightness, shortness of breath and wheezing.        Asthma and emphysema. Quit smoking a couple of years ago.  Cardiovascular: Negative for chest pain, palpitations and leg swelling.       AAA repaired in 2014  Gastrointestinal: Positive for diarrhea. Negative for abdominal distention, abdominal pain, constipation and nausea.       Hx GERD  Endocrine: Negative for cold intolerance, heat intolerance, polydipsia, polyphagia and polyuria.  Genitourinary: Positive for frequency. Negative for dysuria, testicular pain and urgency.       Not incontinent. Hx impotence.  Musculoskeletal: Positive for back pain. Negative for arthralgias, gait problem, myalgias and neck pain.       Hx gout  Skin: Negative for color change, pallor and rash.  Allergic/Immunologic: Negative.   Neurological: Negative for dizziness, tremors, syncope, speech difficulty, weakness, numbness and headaches.  Hematological: Negative for adenopathy. Does not bruise/bleed easily.  Psychiatric/Behavioral: Negative for behavioral problems, confusion, decreased concentration, hallucinations and sleep disturbance. The patient is not nervous/anxious.  Vitals:   09/17/16 1315  BP: 140/82  Pulse: 85  Temp: 97.7 F (36.5 C)  TempSrc: Oral  SpO2: 97%  Weight: 237 lb (107.5 kg)  Height: '6\' 2"'$  (1.88 m)   Body mass index is 30.43 kg/m. Wt Readings from Last 3 Encounters:  09/17/16 237 lb (107.5 kg)  07/17/16 232 lb (105.2 kg)  06/19/16 238 lb 6.4 oz (108.1 kg)      Physical Exam  Constitutional: He is oriented to person, place, and time. He appears well-developed and well-nourished. No distress.  HENT:  Right Ear: External ear normal.  Left Ear: External ear normal.  Nose: Nose normal.    Mouth/Throat: Oropharynx is clear and moist. No oropharyngeal exudate.  Eyes: Conjunctivae and EOM are normal. Pupils are equal, round, and reactive to light.  Neck: No JVD present. No tracheal deviation present. No thyromegaly present.  Cardiovascular: Normal rate, regular rhythm, normal heart sounds and intact distal pulses.  Exam reveals no gallop and no friction rub.   No murmur heard. Pulmonary/Chest: No respiratory distress. He has wheezes. He has no rales. He exhibits no tenderness.  Tubular breath sounds  Abdominal: He exhibits no distension and no mass. There is no tenderness.  Musculoskeletal: Normal range of motion. He exhibits no edema or tenderness.  Lymphadenopathy:    He has no cervical adenopathy.  Neurological: He is alert and oriented to person, place, and time. He has normal reflexes. No cranial nerve deficit. Coordination normal.  Skin: No rash noted. No erythema. No pallor.  Psychiatric: He has a normal mood and affect. His behavior is normal. Judgment and thought content normal.    Labs reviewed: Lab Summary Latest Ref Rng & Units 06/19/2016 12/17/2015 08/09/2015 12/08/2014  Hemoglobin 12.6 - 17.7 g/dL (None) (None) 14.2 14.8  Hematocrit 37.5 - 51.0 % (None) (None) 41.3 44.9  White count 3.4 - 10.8 x10E3/uL (None) (None) 6.8 5.4  Platelet count 150 - 379 x10E3/uL (None) (None) 270 (None)  Sodium 135 - 146 mmol/L 139 (None) 141 (None)  Potassium 3.5 - 5.3 mmol/L 4.4 (None) 4.4 (None)  Calcium 8.6 - 10.3 mg/dL 9.2 (None) 9.3 (None)  Phosphorus - (None) (None) (None) (None)  Creatinine 0.70 - 1.33 mg/dL 1.07 (None) 0.97 (None)  AST 10 - 35 U/L 25 (None) 26 (None)  Alk Phos 40 - 115 U/L 85 (None) 92 (None)  Bilirubin 0.2 - 1.2 mg/dL 0.4 (None) 0.3 (None)  Glucose 65 - 99 mg/dL 108(H) (None) 91 (None)  Cholesterol - (None) (None) (None) (None)  HDL cholesterol >39 mg/dL (None) 36(L) (None) 38(L)  Triglycerides 0 - 149 mg/dL (None) 90 (None) 74  LDL Direct - (None)  (None) (None) (None)  LDL Calc 0 - 99 mg/dL (None) 61 (None) 70  Total protein 6.1 - 8.1 g/dL 6.9 (None) (None) (None)  Albumin 3.6 - 5.1 g/dL 4.2 (None) 4.3 (None)  Some recent data might be hidden   Lab Results  Component Value Date   TSH 1.520 12/12/2014   Lab Results  Component Value Date   BUN 14 06/19/2016   BUN 13 08/09/2015   BUN 11 08/08/2014   Lab Results  Component Value Date   HGBA1C 6.0 (H) 08/09/2015   HGBA1C 5.9 (H) 12/12/2014   HGBA1C 5.9 (H) 12/02/2013    Assessment/Plan  1. Acute respiratory infection - HYDROcodone-acetaminophen (HYCET) 7.5-325 mg/15 ml solution; Take 10 mLs by mouth 4 (four) times daily as needed for moderate pain.  Dispense: 120 mL; Refill: 0 - azithromycin (  ZITHROMAX) 250 MG tablet; 2 initially, then 1 daily for infection  Dispense: 6 tablet; Refill: 0  2. Diarrhea, unspecified type - loperamide (IMODIUM A-D) 2 MG tablet; 1 with each loose stool up to 4 times daily as needed to control diarrhea  Dispense: 30 tablet; Refill: 0

## 2016-09-18 ENCOUNTER — Telehealth: Payer: Self-pay

## 2016-09-18 NOTE — Telephone Encounter (Signed)
It is fine to take the Mucinex and Advil along with the other prescribed medications. The Mucinex with help the congestion.

## 2016-09-18 NOTE — Telephone Encounter (Signed)
Patient called the triage line to ask if it would be ok to take mucinex and advil with the z pack and hydrocodone cough medication he was given at this office visit yesterday. Patient stated that he was up most of the night coughing but he was feeling better since the congestion was breaking up. He wants to know if taking mucinex would help get the congestion out faster.    Please advise.

## 2016-09-18 NOTE — Telephone Encounter (Signed)
I spoke to patient to inform him of Dr. Rolly Salter instructions. Patient verbalized understanding and did not have any additional questions.

## 2016-11-04 ENCOUNTER — Encounter: Payer: Self-pay | Admitting: Family

## 2016-11-11 ENCOUNTER — Ambulatory Visit (HOSPITAL_COMMUNITY)
Admission: RE | Admit: 2016-11-11 | Discharge: 2016-11-11 | Disposition: A | Discharge status: Home / Self Care | Payer: Self-pay | Source: Ambulatory Visit | Attending: Family | Admitting: Family

## 2016-11-11 ENCOUNTER — Ambulatory Visit (INDEPENDENT_AMBULATORY_CARE_PROVIDER_SITE_OTHER): Payer: Self-pay | Admitting: Family

## 2016-11-11 ENCOUNTER — Encounter: Payer: Self-pay | Admitting: Family

## 2016-11-11 VITALS — BP 130/81 | HR 63 | Temp 97.0°F | Resp 18 | Ht 74.0 in | Wt 241.1 lb

## 2016-11-11 DIAGNOSIS — I714 Abdominal aortic aneurysm, without rupture, unspecified: Secondary | ICD-10-CM

## 2016-11-11 DIAGNOSIS — Z95828 Presence of other vascular implants and grafts: Secondary | ICD-10-CM

## 2016-11-11 NOTE — Addendum Note (Signed)
Addended by: Lianne Cure A on: 11/11/2016 03:24 PM   Modules accepted: Orders

## 2016-11-11 NOTE — Patient Instructions (Signed)
Before your next abdominal ultrasound:  Take two Extra-Strength Gas-X capsules at bedtime the night before the test. Take another two Extra-Strength Gas-X capsules 3 hours before the test.   

## 2016-11-11 NOTE — Progress Notes (Signed)
[facility] [place]  CC: Follow up s/p EVAR  History of Present Illness  Aaron [name] is a 60 y.o. (1955/12/29) male patient whom Dr. Kellie Simmering has been monitoring post endovascular stent graft repair of abdominal aortic aneurysm on 09-22-12. He has done very well. He has no abdominal or back symptoms that he relates. He quit smoking in 2013 and has not resumed. He denies any claudication symptoms. He takes one aspirin per day. He denies any hx of stroke or TIA.  Pt Diabetic: No Pt smoker: former smoker, quit in 2013, started about age 36 years    Past Medical History:  Diagnosis Date  . Abdominal aortic aneurysm [facility])    sees Dr. Einar Gip for cardiac f/u, [phone]  . Arthritis    lower back  . Asthma    followed by Dr. Berdie Ogren  . Chronic airway obstruction, not elsewhere classified   . Dyspnea    normal PFT April 2012  . External hemorrhoids without mention of complication   . GERD (gastroesophageal reflux disease)   . Gout   . H/O hiatal hernia   . HTN (hypertension)    hx of sees Dr. Graylin Shiver  . Hyperlipidemia   . Insomnia, unspecified   . Other abnormal blood chemistry   . Other dyspnea and respiratory abnormality   . Other malaise and fatigue   . Other specified erythematous condition(695.89)   . Other testicular dysfunction   . Pneumonia    hx of and hx of bronchitis  . Prostatitis, unspecified   . Rash 2011   left chest, biopsy 2012 Dr. Rozann Lesches, results pending  . Skin cancer   . Tobacco abuse    Past Surgical History:  Procedure Laterality Date  . ABDOMINAL AORTIC ENDOVASCULAR STENT GRAFT  09/22/2012   Procedure: ABDOMINAL AORTIC ENDOVASCULAR STENT GRAFT;  Surgeon: Mal Misty, MD;  Location: [place];  Service: Vascular;  Laterality: N/A;  GORE; ultrasound guided.  . APPENDECTOMY    . SKIN CANCER EXCISION    . TOE SURGERY  01/2012   joint of left great toe  . TONSILLECTOMY    . TYMPANOPLASTY    . WRIST SURGERY     left    Social History Social History  Substance Use Topics  . Smoking status: Former Smoker    Packs/day: 0.20    Years: 52.50    Types: Cigarettes    Quit date: 09/02/2012  . Smokeless tobacco: Former Systems developer    Types: Snuff  . Alcohol use No     Comment: past ETOH use quit 11-26-2009   Family History Family History  Problem Relation Age of Onset  . Heart attack Father 60  . COPD Father   . Heart disease Father   . Asthma Daughter   . Cancer Mother     Breast  . Lupus Daughter   . Diabetes Other     Grandson    Current Outpatient Prescriptions on File Prior to Visit  Medication Sig Dispense Refill  . albuterol (PROVENTIL HFA;VENTOLIN HFA) 108 (90 BASE) MCG/ACT inhaler Inhale 2 puffs into the lungs every 6 (six) hours as needed. For cough and wheezing. 24 g 0  . aspirin EC 81 MG tablet Take 81 mg by mouth daily.    Marland Kitchen losartan (COZAAR) 50 MG tablet TAKE ONE TABLET BY MOUTH ONCE DAILY TO CONTROL BLOOD PRESSURE 90 tablet 1  . mometasone-formoterol (DULERA) 200-5 MCG/ACT AERO INHALE 2 PUFFS INTO THE LUNGS 2 (TWO) TIMES DAILY. 39 Inhaler  1  . omeprazole (PRILOSEC OTC) 20 MG tablet Take 20 mg by mouth daily.    . simvastatin (ZOCOR) 20 MG tablet Take 1 tablet (20 mg total) by mouth at bedtime. 90 tablet 1  . azithromycin (ZITHROMAX) 250 MG tablet 2 initially, then 1 daily for infection (Patient not taking: Reported on 11/11/2016) 6 tablet 0  . HYDROcodone-acetaminophen (HYCET) 7.5-325 mg/15 ml solution Take 10 mLs by mouth 4 (four) times daily as needed for moderate pain. (Patient not taking: Reported on 11/11/2016) 120 mL 0  . loperamide (IMODIUM A-D) 2 MG tablet 1 with each loose stool up to 4 times daily as needed to control diarrhea (Patient not taking: Reported on 11/11/2016) 30 tablet 0  . sildenafil (VIAGRA) 50 MG tablet Take 1 tablet (50 mg total) by mouth daily as needed for erectile dysfunction. (Patient not taking: Reported on 11/11/2016) 10 tablet 2   No current  facility-administered medications on file prior to visit.    No Known Allergies   ROS: See HPI for pertinent positives and negatives.  Physical Examination  Vitals:   11/11/16 0935  BP: 130/81  Pulse: 63  Resp: 18  Temp: 97 F (36.1 C)  TempSrc: Oral  SpO2: 96%  Weight: 241 lb 1.6 oz (109.4 kg)  Height: 6\' 2"  (1.88 m)   Body mass index is 30.96 kg/m.  General: A&O x 3, WD, obese male  Pulmonary: Sym exp, respirations are non labored, good air movt, CTAB, no rales, rhonchi, or wheezing.  Cardiac: RRR, Nl S1, S2, no murmur appreciated  Vascular: Vessel Right Left  Radial 2+Palpable 2+Palpable  Carotid  without bruit  without bruit  Aorta Not palpable N/A  Femoral 2+Palpable 2+Palpable  Popliteal Not palpable Not palpable  PT 2+Palpable 2+Palpable  DP 2+Palpable 2+Palpable   Gastrointestinal: soft, NTND, -G/R, - HSM, - moderate sized reducible ventral hernia, - CVAT B.  Musculoskeletal: M/S 5/5 throughout, extremities without ischemic changes.  Neurologic: Pain and light touch intact in extremities, Motor exam as listed above  Non-Invasive Vascular Imaging  EVAR Duplex (Date: 11-11-16)  AAA sac size: 3.93 cm  no endoleak detected   Medical Decision Making  [name] is a 60 y.o. male who presents s/p EVAR (Date: 09-22-12).  Pt is asymptomatic with decreased sac size, based on suboptimal visualization of the posterior wall due to shadowing and overlying bowel gas, compared to the CT on 11-06-15 (4.0 cm).  I discussed with the patient the importance of surveillance of the endograft.  The next endograft duplex will be scheduled for 12 months.  The patient will follow up with [place] in 12 months with these studies.  I emphasized the importance of maximal medical management including strict control of blood pressure, blood glucose, and lipid levels, antiplatelet agents, obtaining regular exercise, and cessation of smoking.   Thank you for allowing [place] to  participate in this patient's care.  Clemon Chambers, RN, MSN, FNP-C [facility] [place] Office: [phone]  Clinic Physician: Early  11/11/2016, 10:01 AM

## 2016-12-12 ENCOUNTER — Encounter: Payer: Self-pay | Admitting: Internal Medicine

## 2016-12-12 ENCOUNTER — Encounter: Payer: Self-pay | Admitting: Physician Assistant

## 2016-12-12 ENCOUNTER — Ambulatory Visit (INDEPENDENT_AMBULATORY_CARE_PROVIDER_SITE_OTHER): Payer: 59 | Admitting: Internal Medicine

## 2016-12-12 VITALS — BP 132/82 | HR 90 | Temp 98.0°F | Ht 74.0 in | Wt 238.4 lb

## 2016-12-12 DIAGNOSIS — Z86010 Personal history of colonic polyps: Secondary | ICD-10-CM

## 2016-12-12 DIAGNOSIS — I714 Abdominal aortic aneurysm, without rupture, unspecified: Secondary | ICD-10-CM

## 2016-12-12 DIAGNOSIS — K645 Perianal venous thrombosis: Secondary | ICD-10-CM

## 2016-12-12 DIAGNOSIS — K5900 Constipation, unspecified: Secondary | ICD-10-CM | POA: Diagnosis not present

## 2016-12-12 DIAGNOSIS — N529 Male erectile dysfunction, unspecified: Secondary | ICD-10-CM

## 2016-12-12 LAB — CBC WITH DIFFERENTIAL/PLATELET
BASOS ABS: 87 {cells}/uL (ref 0–200)
Basophils Relative: 1 %
EOS ABS: 87 {cells}/uL (ref 15–500)
Eosinophils Relative: 1 %
HEMATOCRIT: 44.7 % (ref 38.5–50.0)
HEMOGLOBIN: 15.1 g/dL (ref 13.2–17.1)
LYMPHS ABS: 2001 {cells}/uL (ref 850–3900)
Lymphocytes Relative: 23 %
MCH: 29.7 pg (ref 27.0–33.0)
MCHC: 33.8 g/dL (ref 32.0–36.0)
MCV: 87.8 fL (ref 80.0–100.0)
MONO ABS: 783 {cells}/uL (ref 200–950)
MPV: 9.3 fL (ref 7.5–12.5)
Monocytes Relative: 9 %
NEUTROS ABS: 5742 {cells}/uL (ref 1500–7800)
Neutrophils Relative %: 66 %
Platelets: 320 10*3/uL (ref 140–400)
RBC: 5.09 MIL/uL (ref 4.20–5.80)
RDW: 12.9 % (ref 11.0–15.0)
WBC: 8.7 10*3/uL (ref 3.8–10.8)

## 2016-12-12 MED ORDER — HYDROCODONE-ACETAMINOPHEN 10-325 MG PO TABS: 1.0000 | ORAL_TABLET | Freq: Four times a day (QID) | ORAL | 0 refills | Status: DC | PRN

## 2016-12-12 MED ORDER — HYDROCORTISONE ACE-PRAMOXINE 1-1 % RE FOAM: 1.0000 | Freq: Two times a day (BID) | RECTAL | 1 refills | Status: DC

## 2016-12-12 NOTE — Progress Notes (Signed)
Patient ID: Aaron [name], male   DOB: [birth year], 61 y.o.   MRN: [id number]    Location:  [facility] Place of Service: OFFICE  Chief Complaint  Patient presents with  . Acute Visit    rectal problem     HPI:  61 yo male seen today for hemorrhoid concerns. He  reports x 3-4 yrs with issue with rectum. He states that it feels like a "ball". Yesterday, he had a nml BM but the "ball" would not go back in. No straining/constipation. No itching. He has had difficulty starting a urine stream. He had BRBPR yesterday - wife noticed a "tear" in "ball" and applied vasoline and preparation H. He did not sleep well last night. Current job requires driving [id number] miles per day. 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 No N/V, abdominal pain, f/c. He has had diaphoresis. Last colonoscopy in 2010 with [facility]- 2 polyps removed but neither precancerous.  He has a hx AAA s/p stent by Dr Kellie Simmering and is followed by [facility]. Recent imaging revealed size reduced to 3.93 cm with no endoleak.  Past Medical History:  Diagnosis Date  . Abdominal aortic aneurysm [facility])    sees Dr. Einar Gip for cardiac f/u, [phone]  . Arthritis    lower back  . Asthma    followed by Dr. Berdie Ogren  . Chronic airway obstruction, not elsewhere classified   . Dyspnea    normal PFT April 2012  . External hemorrhoids without mention of complication   . GERD (gastroesophageal reflux disease)   . Gout   . H/O hiatal hernia   . HTN (hypertension)    hx of sees Dr. Graylin Shiver  . Hyperlipidemia   . Insomnia, unspecified   . Other abnormal blood chemistry   . Other dyspnea and respiratory abnormality   . Other malaise and fatigue   . Other specified erythematous condition(695.89)   . Other testicular dysfunction   . Pneumonia    hx of and hx of bronchitis  . Prostatitis, unspecified   . Rash 2011   left chest, biopsy 2012 Dr. Rozann Lesches, results pending  . Skin cancer   . Tobacco  abuse     Past Surgical History:  Procedure Laterality Date  . ABDOMINAL AORTIC ENDOVASCULAR STENT GRAFT  09/22/2012   Procedure: ABDOMINAL AORTIC ENDOVASCULAR STENT GRAFT;  Surgeon: Mal Misty, MD;  Location: [place];  Service: Vascular;  Laterality: N/A;  GORE; ultrasound guided.  . APPENDECTOMY    . SKIN CANCER EXCISION    . TOE SURGERY  01/2012   joint of left great toe  . TONSILLECTOMY    . TYMPANOPLASTY    . WRIST SURGERY     left    [name], NP as PCP - General (Nurse Practitioner) Blanchie Serve, MD as Attending Physician (Internal Medicine)  Social History   Social History  . Marital status: Married    Spouse name: N/A  . Number of children: N/A  . Years of education: N/A   Occupational History  . CNA [employer]   Social History Main Topics  . Smoking status: Former Smoker    Packs/day: 0.20    Years: 52.50    Types: Cigarettes    Quit date: 09/02/2012  . Smokeless tobacco: Former Systems developer    Types: Snuff  . Alcohol use No     Comment: past ETOH use quit 11-26-2009  . Drug use: No  .  Sexual activity: Not on file   Other Topics Concern  . Not on file   Social History Narrative  . No narrative on file     reports that he quit smoking about 4 years ago. His smoking use included Cigarettes. He has a 10.50 pack-year smoking history. He has quit using smokeless tobacco. His smokeless tobacco use included Snuff. He reports that he does not drink alcohol or use drugs.  Family History  Problem Relation Age of Onset  . Heart attack Father 15  . COPD Father   . Heart disease Father   . Asthma Daughter   . Cancer Mother     Breast  . Lupus Daughter   . Diabetes Other     Grandson    Family Status  Relation Status  . Father Deceased at age 58   MI  . Daughter Alive  . Mother Deceased at age 54   Cerebrovascular accident  . Daughter Alive   well  . Brother Alive  . Daughter   . Other      No Known  Allergies  Medications: Patient's Medications  New Prescriptions   No medications on file  Previous Medications   ALBUTEROL (PROVENTIL HFA;VENTOLIN HFA) 108 (90 BASE) MCG/ACT INHALER    Inhale 2 puffs into the lungs every 6 (six) hours as needed. For cough and wheezing.   ASPIRIN EC 81 MG TABLET    Take 81 mg by mouth daily.   LOSARTAN (COZAAR) 50 MG TABLET    TAKE ONE TABLET BY MOUTH ONCE DAILY TO CONTROL BLOOD PRESSURE   MOMETASONE-FORMOTEROL (DULERA) 200-5 MCG/ACT AERO    INHALE 2 PUFFS INTO THE LUNGS 2 (TWO) TIMES DAILY.   OMEPRAZOLE (PRILOSEC OTC) 20 MG TABLET    Take 20 mg by mouth daily.   SILDENAFIL (VIAGRA) 50 MG TABLET    Take 1 tablet (50 mg total) by mouth daily as needed for erectile dysfunction.   SIMVASTATIN (ZOCOR) 20 MG TABLET    Take 1 tablet (20 mg total) by mouth at bedtime.  Modified Medications   No medications on file  Discontinued Medications   AZITHROMYCIN (ZITHROMAX) 250 MG TABLET    2 initially, then 1 daily for infection   HYDROCODONE-ACETAMINOPHEN (HYCET) 7.5-325 MG/15 ML SOLUTION    Take 10 mLs by mouth 4 (four) times daily as needed for moderate pain.   LOPERAMIDE (IMODIUM A-D) 2 MG TABLET    1 with each loose stool up to 4 times daily as needed to control diarrhea    Review of Systems  Gastrointestinal: Positive for anal bleeding, blood in stool and rectal pain.  Psychiatric/Behavioral: Positive for sleep disturbance.  All other systems reviewed and are negative.   Vitals:   12/12/16 1048  BP: 132/82  Pulse: 90  Temp: 98 F (36.7 C)  TempSrc: Oral  SpO2: 98%  Weight: 238 lb 6.4 oz (108.1 kg)  Height: 6\' 2"  (1.88 m)   Body mass index is 30.61 kg/m.  Physical Exam  Constitutional: He appears well-developed and well-nourished. No distress.  Cardiovascular: Normal rate, regular rhythm, normal heart sounds and intact distal pulses.  Exam reveals no gallop and no friction rub.   No murmur heard. No LE edema b/l. No calf TTP  Pulmonary/Chest:  Effort normal and breath sounds normal. No respiratory distress. He has no wheezes. He has no rales. He exhibits no tenderness.  Abdominal: Soft. Normal appearance and bowel sounds are normal. He exhibits pulsatile midline mass. He exhibits no distension  and no mass. There is no hepatomegaly. There is no tenderness. There is no rebound and no guarding. No hernia.  Genitourinary: Rectal exam shows external hemorrhoid. Rectal exam shows no fissure and guaiac negative stool. Prostate is not enlarged and not tender.        Labs reviewed: No visits with results within 3 Month(s) from this visit.  Latest known visit with results is:  Office Visit on 06/19/2016  Component Date Value Ref Range Status  . Sodium 06/19/2016 139  135 - 146 mmol/L Final  . Potassium 06/19/2016 4.4  3.5 - 5.3 mmol/L Final  . Chloride 06/19/2016 105  98 - 110 mmol/L Final  . CO2 06/19/2016 26  20 - 31 mmol/L Final  . Glucose, Bld 06/19/2016 108* 65 - 99 mg/dL Final  . BUN 06/19/2016 14  7 - 25 mg/dL Final  . Creat 06/19/2016 1.07  0.70 - 1.33 mg/dL Final   Comment:   For patients > or = 59 years of age: The upper reference limit for Creatinine is approximately 13% higher for people identified as African-American.     . Total Bilirubin 06/19/2016 0.4  0.2 - 1.2 mg/dL Final  . Alkaline Phosphatase 06/19/2016 85  40 - 115 U/L Final  . AST 06/19/2016 25  10 - 35 U/L Final  . ALT 06/19/2016 29  9 - 46 U/L Final  . Total Protein 06/19/2016 6.9  6.1 - 8.1 g/dL Final  . Albumin 06/19/2016 4.2  3.6 - 5.1 g/dL Final  . Calcium 06/19/2016 9.2  8.6 - 10.3 mg/dL Final  . GFR, Est African American 06/19/2016 89  >=60 mL/min Final  . GFR, Est Non African American 06/19/2016 77  >=60 mL/min Final    No results found.   Assessment/Plan   ICD-9-CM ICD-10-CM   1. Thrombosed external hemorrhoid 455.4 K64.5 CBC with Differential/Platelets     hydrocortisone-pramoxine (PROCTOFOAM HC) rectal foam     HYDROcodone-acetaminophen  (NORCO) 10-325 MG tablet     Ambulatory referral to Gastroenterology     CANCELED: Ambulatory referral to Gastroenterology  2. History of colonic polyps V12.72 Z86.010 Ambulatory referral to Gastroenterology     CANCELED: Ambulatory referral to Gastroenterology  3. Constipation, unspecified constipation type 564.00 K59.00 Ambulatory referral to Gastroenterology     CANCELED: Ambulatory referral to Gastroenterology  4. AAA (abdominal aortic aneurysm) without rupture (HCC) 441.4 I71.4   5. Erectile dysfunction, unspecified erectile dysfunction type 607.84 N52.9    Will call with GI referral  Will call with lab results  Samples of viagra as scheduled  Recommend high fiber diet to reduce straining. Take stool softener daily  Follow up as scheduled  Go to the ER if increased rectal bleeding and/or pain.    Paquita Printy S. Perlie Gold  [facility] and Adult Medicine [address] [place], [place] [address] [phone] Cell (Monday-Friday 8 AM - 5 PM) [phone] After 5 PM and follow prompts

## 2016-12-12 NOTE — Patient Instructions (Addendum)
Will call with GI referral  Will call with lab results  Samples of viagra as scheduled  Recommend high fiber diet to reduce straining. Take stool softener daily  Follow up as scheduled  Go to the ER if increased rectal bleeding and/or pain.   Hemorrhoids Introduction Hemorrhoids are swollen veins in and around the rectum or anus. Hemorrhoids can cause pain, itching, or bleeding. Most of the time, they do not cause serious problems. They usually get better with diet changes, lifestyle changes, and other home treatments. Follow these instructions at home: Eating and drinking  Eat foods that have fiber, such as whole grains, beans, nuts, fruits, and vegetables. Ask your doctor about taking products that have added fiber (fibersupplements).  Drink enough fluid to keep your pee (urine) clear or pale yellow. For Pain and Swelling  Take a warm-water bath (sitz bath) for 20 minutes to ease pain. Do this 3-4 times a day.  If directed, put ice on the painful area. It may be helpful to use ice between your warm baths.  Put ice in a plastic bag.  Place a towel between your skin and the bag.  Leave the ice on for 20 minutes, 2-3 times a day. General instructions  Take over-the-counter and prescription medicines only as told by your doctor.  Medicated creams and medicines that are inserted into the anus (suppositories) may be used or applied as told.  Exercise often.  Go to the bathroom when you have the urge to poop (to have a bowel movement). Do not wait.  Avoid pushing too hard (straining) when you poop.  Keep the butt area dry and clean. Use wet toilet paper or moist paper towels.  Do not sit on the toilet for a long time. Contact a doctor if:  You have any of these:  Pain and swelling that do not get better with treatment or medicine.  Bleeding that will not stop.  Trouble pooping or you cannot poop.  Pain or swelling outside the area of the hemorrhoids. This  information is not intended to replace advice given to you by your health care provider. Make sure you discuss any questions you have with your health care provider. Document Released: 08/19/2008 Document Revised: 04/17/2016 Document Reviewed: 07/25/2015  2017 Elsevier

## 2016-12-17 ENCOUNTER — Encounter: Payer: Self-pay | Admitting: Gastroenterology

## 2016-12-17 ENCOUNTER — Other Ambulatory Visit: Payer: Self-pay | Admitting: Surgery

## 2016-12-17 ENCOUNTER — Telehealth: Payer: Self-pay | Admitting: Gastroenterology

## 2016-12-17 ENCOUNTER — Ambulatory Visit (INDEPENDENT_AMBULATORY_CARE_PROVIDER_SITE_OTHER): Payer: 59 | Admitting: Gastroenterology

## 2016-12-17 VITALS — BP 120/80 | HR 72 | Ht 74.0 in | Wt 238.2 lb

## 2016-12-17 DIAGNOSIS — K642 Third degree hemorrhoids: Secondary | ICD-10-CM | POA: Diagnosis not present

## 2016-12-17 DIAGNOSIS — K644 Residual hemorrhoidal skin tags: Secondary | ICD-10-CM

## 2016-12-17 DIAGNOSIS — K611 Rectal abscess: Secondary | ICD-10-CM | POA: Diagnosis not present

## 2016-12-17 MED ORDER — LIDOCAINE (ANORECTAL) 5 % EX CREA: 1.0000 "application " | TOPICAL_CREAM | Freq: Three times a day (TID) | CUTANEOUS | 1 refills | Status: DC

## 2016-12-17 MED ORDER — HYDROCORTISONE 2.5 % RE CREA: 1.0000 "application " | TOPICAL_CREAM | Freq: Two times a day (BID) | RECTAL | 1 refills | Status: DC

## 2016-12-17 MED ORDER — TINIDAZOLE 250 MG PO TABS: 250.0000 mg | ORAL_TABLET | Freq: Two times a day (BID) | ORAL | 0 refills | Status: DC

## 2016-12-17 NOTE — Progress Notes (Signed)
Aaron [name]    [id number]    [birth year]  Primary Care Physician:EUBANKS, Carlos American, NP  Referring Physician: Gildardo Cranker, DO [place], [place] [id number]  Chief complaint:  Hemorrhoids  HPI: 61 yr M here with c/o bleeding hemorrhoids since last night. He has had issue with hemorrhoids for past 4-5 years and has felt soft tissue when he wipes after a bowel movement that protrudes out and usually goes back in about 15-20 minutes after bowel movement on its own. For past 1 week he felt that the tissue wasn't going back and and there was much more swelling. He was having discomfort and itching in the perirectal area along with night sweats for the past 2 days but denies any fever or pus discharge. It opened up last night and he had a significant amount of bleeding. He has been doing bath with Epsom salt and has also been taking stool softeners for past 1 week. 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  Last colonoscopy in 2010 with removal of 2 diminutive sessile polyps that were hyperplastic from descending and rectosigmoid colon.   Outpatient Encounter Prescriptions as of 12/17/2016  Medication Sig  . albuterol (PROVENTIL HFA;VENTOLIN HFA) 108 (90 BASE) MCG/ACT inhaler Inhale 2 puffs into the lungs every 6 (six) hours as needed. For cough and wheezing.  Marland Kitchen aspirin EC 81 MG tablet Take 81 mg by mouth daily.  Marland Kitchen HYDROcodone-acetaminophen (NORCO) 10-325 MG tablet Take 1 tablet by mouth every 6 (six) hours as needed.  . hydrocortisone-pramoxine (PROCTOFOAM HC) rectal foam Place 1 applicator rectally 2 (two) times daily.  Marland Kitchen losartan (COZAAR) 50 MG tablet TAKE ONE TABLET BY MOUTH ONCE DAILY TO CONTROL BLOOD PRESSURE  . mometasone-formoterol (DULERA) 200-5 MCG/ACT AERO INHALE 2 PUFFS INTO THE LUNGS 2 (TWO) TIMES DAILY.  Marland Kitchen omeprazole (PRILOSEC OTC) 20 MG tablet Take 20 mg by mouth daily.  .  sildenafil (VIAGRA) 50 MG tablet Take 1 tablet (50 mg total) by mouth daily as needed for erectile dysfunction.  . simvastatin (ZOCOR) 20 MG tablet Take 1 tablet (20 mg total) by mouth at bedtime.   No facility-administered encounter medications on file as of 12/17/2016.     Allergies as of 12/17/2016  . (No Known Allergies)    Past Medical History:  Diagnosis Date  . Abdominal aortic aneurysm [facility])    sees Dr. Einar Gip for cardiac f/u, [id number]  . Arthritis    lower back  . Asthma    followed by Dr. Berdie Ogren  . Chronic airway obstruction, not elsewhere classified   . Dyspnea    normal PFT April 2012  . External hemorrhoids without mention of complication   . GERD (gastroesophageal reflux disease)   . Gout   . H/O hiatal hernia   . HTN (hypertension)    hx of sees Dr. Graylin Shiver  . Hyperlipidemia   . Insomnia, unspecified   . Other abnormal blood chemistry   . Other dyspnea and respiratory abnormality   . Other malaise and fatigue   . Other specified erythematous condition(695.89)   . Other testicular dysfunction   . Pneumonia    hx of and hx of bronchitis  . Prostatitis, unspecified   . Rash 2011   left chest, biopsy 2012 Dr. Rozann Lesches, results pending  . Skin cancer   . Tobacco abuse  Past Surgical History:  Procedure Laterality Date  . ABDOMINAL AORTIC ENDOVASCULAR STENT GRAFT  09/22/2012   Procedure: ABDOMINAL AORTIC ENDOVASCULAR STENT GRAFT;  Surgeon: Mal Misty, MD;  Location: [place];  Service: Vascular;  Laterality: N/A;  GORE; ultrasound guided.  . APPENDECTOMY    . SKIN CANCER EXCISION    . TOE SURGERY  01/2012   joint of left great toe  . TONSILLECTOMY    . TYMPANOPLASTY    . WRIST SURGERY     left    Family History  Problem Relation Age of Onset  . Heart attack Father 66  . COPD Father   . Heart disease Father   . Asthma Daughter   . Cancer Mother     Breast  . Lupus Daughter   . Diabetes Other     Grandson     Social History     Social History  . Marital status: Married    Spouse name: N/A  . Number of children: N/A  . Years of education: N/A   Occupational History  . CNA [employer]   Social History Main Topics  . Smoking status: Former Smoker    Packs/day: 0.20    Years: 52.50    Types: Cigarettes    Quit date: 09/02/2012  . Smokeless tobacco: Former Systems developer    Types: Snuff  . Alcohol use No     Comment: past ETOH use quit 11-26-2009  . Drug use: No  . Sexual activity: Not on file   Other Topics Concern  . Not on file   Social History Narrative  . No narrative on file      Review of systems: Review of Systems  Constitutional: Negative for fever and chills.  HENT: Negative.   Eyes: Negative for blurred vision.  Respiratory: Negative for cough, shortness of breath and wheezing.   Cardiovascular: Negative for chest pain and palpitations.  Gastrointestinal: as per HPI Genitourinary: Negative for dysuria, urgency, frequency and hematuria.  Musculoskeletal: Negative for myalgias, back pain and joint pain.  Skin: Negative for itching and rash.  Neurological: Negative for dizziness, tremors, focal weakness, seizures and loss of consciousness.  Endo/Heme/Allergies: Negative for seasonal allergies.  Psychiatric/Behavioral: Negative for depression, suicidal ideas and hallucinations.  All other systems reviewed and are negative.   Physical Exam: Vitals:   12/17/16 0833  BP: 120/80  Pulse: 72   Body mass index is 30.58 kg/m. Gen:      No acute distress HEENT:  EOMI, sclera anicteric Neck:     No masses; no thyromegaly Lungs:    Clear to auscultation bilaterally; normal respiratory effort CV:         Regular rate and rhythm; no murmurs Abd:      + bowel sounds; soft, non-tender; no palpable masses, no distension Ext:    No edema; adequate peripheral perfusion Skin:      Warm and dry; no rash Neuro: alert and oriented x 3 Psych: normal mood and affect Rectal exam: Normal anal sphincter  tone, no anal fissure, indurated left perianal area with 2 mm ulcer covered with clot . No fluctuation.  Anoscopy: Grade 3 internal hemorrhoids, no visible nodules or fistula  Data Reviewed:  Reviewed labs, radiology imaging, old records and pertinent past GI work up   Assessment and Plan/Recommendations:  61 year old male with history of likely symptomatic hemorrhoids here with complaints of blood per rectum likely from thrombosed external hemorrhoid, cannot exclude based on exam perianal abscess. Advise patient to do  sitz bath 2-3 times daily Anusol cream twice daily Recticare as needed TID for discomfort Tinidazole 250 mg twice daily for 7 days  Patient started oozing blood from Peri anal ulcer as he was getting ready to leave at the end of the office visit, applied gauze with control of bleeding Patient has an urgent follow-up visit with [facility] for further evaluation and packing if needed  60 minutes was spent face-to-face with the patient. Greater than 50% of the time used for counseling as well as treatment plan and follow-up. He had multiple questions which were answered to his satisfaction  K. Denzil Magnuson , MD [phone] Mon-Fri 8a-5p [phone] after 5p, weekends, holidays  CC: Gildardo Cranker, DO

## 2016-12-17 NOTE — Telephone Encounter (Signed)
Left a message for the patient. He should show the prescriptions to the PA he is seeing in the surgeons office today. The PA may want him to fill these Rx's or may want to have him use something different.

## 2016-12-17 NOTE — Patient Instructions (Signed)
We have scheduled you an appointment with Leighton Ruff at [facility] on 12/29/2016 at 9:40 am. You will need to arrive at 9:10am with your insurance cards and list of your medications  Do Sitz baths three times a day  We have sent your prescriptions to your pharmacy  We have given you a work note for 2 weeks off   [place] Introduction A disposable sitz bath is a plastic basin that fits over the toilet. A bag is hung above the toilet, and the bag is connected to a tube that opens into the basin. The bag is filled with warm water that flows into the basin through the tube. A sitz bath can be used to help relieve symptoms, clean, and promote healing in the genital and anal areas, as well as in the lower abdomen and buttocks. What are the risks? Sitz baths are generally very safe. It is possible for the skin between the genitals and the anus (perineum) to become infected, but this is rare. You can avoid this by cleaning your sitz bath supplies thoroughly. How to use a disposable sitz bath 1. Close the clamp on the tube. Make sure the clamp is closed tightly to prevent leakage. 2. Fill the sitz bath basin and the plastic bag with warm water. The water should be warm enough to be comfortable, but not hot. 3. Raise the toilet seat and place the filled basin on the toilet. Make sure the overflow opening is facing toward the back of the toilet.  If you prefer, you may place the empty basin on the toilet first, and then use the plastic bag to fill the basin with warm water. 4. Hang the filled plastic bag overhead on a hook or towel rack close to the toilet. The bag should be higher than the toilet so that the water will flow down through the tube. 5. Attach the tube to the opening on the basin. Make sure that the tube is attached to the basin tightly to prevent leakage. 6. Sit on the basin and release the clamp. This will allow warm water to flow into the basin and flush the area  around your genitals and anus. 7. Remain sitting on the basin for about 15-20 minutes, or as long as told by your health care provider. 8. Stand up and gently pat your skin dry. If directed, apply clean bandages (dressings) to the affected area as told by your health care provider. 9. Carefully remove the basin from the toilet seat and tip the basin into the toilet to empty any remaining water. Empty any remaining water from the plastic bag into the toilet. Then, flush the toilet. 10. Wash the basin with warm water and soap. Let the basin air dry in the sink. You should also let the plastic bag and the tubing air dry. 11. Store the basin, tubing, and plastic bag in a clean, dry area. 12. Wash your hands with soap and water. If soap and water are not available, use hand sanitizer. Contact a health care provider if:  You have symptoms that get worse instead of better.  You develop new skin irritation, redness, or swelling around your genitals or anus. This information is not intended to replace advice given to you by your health care provider. Make sure you discuss any questions you have with your health care provider. Document Released: 05/11/2012 Document Revised: 04/17/2016 Document Reviewed: 09/30/2015  2017 Elsevier

## 2016-12-18 ENCOUNTER — Ambulatory Visit: Payer: Self-pay | Admitting: Physician Assistant

## 2016-12-23 ENCOUNTER — Encounter (HOSPITAL_BASED_OUTPATIENT_CLINIC_OR_DEPARTMENT_OTHER): Payer: Self-pay | Admitting: *Deleted

## 2016-12-24 ENCOUNTER — Encounter: Payer: Self-pay | Admitting: Internal Medicine

## 2016-12-24 ENCOUNTER — Encounter (HOSPITAL_BASED_OUTPATIENT_CLINIC_OR_DEPARTMENT_OTHER)
Admission: RE | Admit: 2016-12-24 | Discharge: 2016-12-24 | Disposition: A | Discharge status: Home / Self Care | Payer: 59 | Source: Ambulatory Visit | Attending: Surgery | Admitting: Surgery

## 2016-12-24 ENCOUNTER — Ambulatory Visit (INDEPENDENT_AMBULATORY_CARE_PROVIDER_SITE_OTHER): Payer: 59 | Admitting: Internal Medicine

## 2016-12-24 VITALS — BP 118/80 | HR 65 | Temp 97.7°F | Ht 74.0 in | Wt 236.2 lb

## 2016-12-24 DIAGNOSIS — I10 Essential (primary) hypertension: Secondary | ICD-10-CM | POA: Diagnosis not present

## 2016-12-24 DIAGNOSIS — Z79899 Other long term (current) drug therapy: Secondary | ICD-10-CM | POA: Diagnosis not present

## 2016-12-24 DIAGNOSIS — Z01810 Encounter for preprocedural cardiovascular examination: Secondary | ICD-10-CM | POA: Insufficient documentation

## 2016-12-24 DIAGNOSIS — E782 Mixed hyperlipidemia: Secondary | ICD-10-CM

## 2016-12-24 DIAGNOSIS — I714 Abdominal aortic aneurysm, without rupture, unspecified: Secondary | ICD-10-CM

## 2016-12-24 DIAGNOSIS — J45909 Unspecified asthma, uncomplicated: Secondary | ICD-10-CM

## 2016-12-24 DIAGNOSIS — K219 Gastro-esophageal reflux disease without esophagitis: Secondary | ICD-10-CM

## 2016-12-24 DIAGNOSIS — K645 Perianal venous thrombosis: Secondary | ICD-10-CM

## 2016-12-24 DIAGNOSIS — Z125 Encounter for screening for malignant neoplasm of prostate: Secondary | ICD-10-CM

## 2016-12-24 LAB — COMPLETE METABOLIC PANEL WITH GFR
ALBUMIN: 4.2 g/dL (ref 3.6–5.1)
ALK PHOS: 93 U/L (ref 40–115)
ALT: 25 U/L (ref 9–46)
AST: 22 U/L (ref 10–35)
BILIRUBIN TOTAL: 0.5 mg/dL (ref 0.2–1.2)
BUN: 14 mg/dL (ref 7–25)
CALCIUM: 9.7 mg/dL (ref 8.6–10.3)
CO2: 27 mmol/L (ref 20–31)
CREATININE: 1.02 mg/dL (ref 0.70–1.33)
Chloride: 105 mmol/L (ref 98–110)
GFR, Est Non African American: 81 mL/min (ref >=60)
Glucose, Bld: 89 mg/dL (ref 65–99)
Potassium: 5 mmol/L (ref 3.5–5.3)
Sodium: 141 mmol/L (ref 135–146)
TOTAL PROTEIN: 7.4 g/dL (ref 6.1–8.1)

## 2016-12-24 LAB — LIPID PANEL
CHOLESTEROL: 117 mg/dL (ref <200)
HDL: 32 mg/dL — ABNORMAL LOW (ref >40)
LDL Cholesterol: 62 mg/dL (ref <100)
TRIGLYCERIDES: 117 mg/dL (ref <150)
Total CHOL/HDL Ratio: 3.7 Ratio (ref <5.0)
VLDL: 23 mg/dL (ref <30)

## 2016-12-24 LAB — PSA: PSA: 0.8 ng/mL (ref <=4.0)

## 2016-12-24 LAB — TSH: TSH: 2.24 mIU/L (ref 0.40–4.50)

## 2016-12-24 MED ORDER — PANTOPRAZOLE SODIUM 40 MG PO TBEC: 40.0000 mg | DELAYED_RELEASE_TABLET | Freq: Every day | ORAL | 3 refills | Status: DC

## 2016-12-24 NOTE — Patient Instructions (Signed)
Follow up with specialists as scheduled  STOP omeprazole OTC  START pantoprazole daily for acid reflux  Continue other medications as ordered  Will call with lab results  Medically cleared for hemorrhoid surgery  Follow up in 6 mos with Janett Billow for CPE

## 2016-12-24 NOTE — Progress Notes (Signed)
Pt in for PAT appt. EKG done and reviewed by Dr Lissa Hoard.

## 2016-12-24 NOTE — Progress Notes (Signed)
Patient ID: Aaron [name], male   DOB: [birth year], 61 y.o.   MRN: [id number]    Location:  [facility] Place of Service: OFFICE  Chief Complaint  Patient presents with  . Medical Management of Chronic Issues    6 months routine visit    HPI:  61 yo male seen today for f/u. He saw GI and is scheduled for surgery by Dr Rush Farmer next week. Plan is to have external hemorrhoid resected and internal hemorrhoids banded. Feels better today as they had reduced in size since last week. Diet now high in fiber and takes stool softener. He is no longer using proctofoam HC. He is using anusol cream per GI as well as BID sitz bath. Occasionally uses recticare for itching. He was told by sx that abx tinidazole not needed at this time as no sign of infection. He is not expected to return to work until mid Feb 2018.  HTN - stable on cozaar. Takes ASA daily  Hyperlipidemia - stable on simvastatin  ED - takes viagra prn  GERD/ hx HH - prilosec OTC ineffective. He is having breakthrough sx's frequently  Asthma - stable on dulera and prn HFA  Past Medical History:  Diagnosis Date  . Abdominal aortic aneurysm [facility])    sees Dr. Einar Gip for cardiac f/u, [phone]  . Arthritis    lower back  . Asthma    followed by Dr. Berdie Ogren  . Chronic airway obstruction, not elsewhere classified   . Dyspnea    normal PFT April 2012  . External hemorrhoids without mention of complication   . GERD (gastroesophageal reflux disease)   . Gout   . H/O hiatal hernia   . Hemorrhoids   . HTN (hypertension)    hx of sees Dr. Graylin Shiver  . Hyperlipidemia   . Insomnia, unspecified   . Other abnormal blood chemistry   . Other dyspnea and respiratory abnormality   . Other malaise and fatigue   . Other specified erythematous condition(695.89)   . Other testicular dysfunction   . Prostatitis, unspecified   . Rash 2011   left chest, biopsy 2012 Dr. Rozann Lesches, results pending  . Skin cancer   . Tobacco abuse     Past  Surgical History:  Procedure Laterality Date  . ABDOMINAL AORTIC ENDOVASCULAR STENT GRAFT  09/22/2012   Procedure: ABDOMINAL AORTIC ENDOVASCULAR STENT GRAFT;  Surgeon: Mal Misty, MD;  Location: [facility];  Service: Vascular;  Laterality: N/A;  GORE; ultrasound guided.  . APPENDECTOMY    . SKIN CANCER EXCISION    . TOE SURGERY  01/2012   joint of left great toe  . TONSILLECTOMY    . TONSILLECTOMY    . TYMPANOPLASTY    . WRIST SURGERY     left    [name], NP as PCP - General (Nurse Practitioner) Blanchie Serve, MD as Attending Physician (Internal Medicine)  Social History   Social History  . Marital status: Married    Spouse name: N/A  . Number of children: N/A  . Years of education: N/A   Occupational History  . CNA [place]   Social History Main Topics  . Smoking status: Former Smoker    Packs/day: 0.20    Years: 52.50    Types: Cigarettes    Quit date: 09/02/2012  . Smokeless tobacco: Former Systems developer    Types: Snuff  . Alcohol use No     Comment: past ETOH use quit 11-26-2009  . Drug use:  No  . Sexual activity: Not on file   Other Topics Concern  . Not on file   Social History Narrative  . No narrative on file     reports that he quit smoking about 4 years ago. His smoking use included Cigarettes. He has a 10.50 pack-year smoking history. He has quit using smokeless tobacco. His smokeless tobacco use included Snuff. He reports that he does not drink alcohol or use drugs.  Family History  Problem Relation Age of Onset  . Heart attack Father 74  . COPD Father   . Heart disease Father   . Asthma Daughter   . Cancer Mother     Breast  . Lupus Daughter   . Diabetes Other     Grandson    Family Status  Relation Status  . Father Deceased at age 25   MI  . Daughter Alive  . Mother Deceased at age 29   Cerebrovascular accident  . Daughter Alive   well  . Brother Alive  . Daughter   . Other      No Known  Allergies  Medications: Patient's Medications  New Prescriptions   No medications on file  Previous Medications   ALBUTEROL (PROVENTIL HFA;VENTOLIN HFA) 108 (90 BASE) MCG/ACT INHALER    Inhale 2 puffs into the lungs every 6 (six) hours as needed. For cough and wheezing.   ASPIRIN EC 81 MG TABLET    Take 81 mg by mouth daily.   HYDROCODONE-ACETAMINOPHEN (NORCO) 10-325 MG TABLET    Take 1 tablet by mouth every 6 (six) hours as needed.   HYDROCORTISONE (ANUSOL-HC) 2.5 % RECTAL CREAM    Place 1 application rectally 2 (two) times daily.   LIDOCAINE, ANORECTAL, (RECTICARE) 5 % CREA    Apply 1 application topically 3 (three) times daily.   LOSARTAN (COZAAR) 50 MG TABLET    TAKE ONE TABLET BY MOUTH ONCE DAILY TO CONTROL BLOOD PRESSURE   MOMETASONE-FORMOTEROL (DULERA) 200-5 MCG/ACT AERO    INHALE 2 PUFFS INTO THE LUNGS 2 (TWO) TIMES DAILY.   OMEPRAZOLE (PRILOSEC OTC) 20 MG TABLET    Take 20 mg by mouth daily.   SILDENAFIL (VIAGRA) 50 MG TABLET    Take 1 tablet (50 mg total) by mouth daily as needed for erectile dysfunction.   SIMVASTATIN (ZOCOR) 20 MG TABLET    Take 1 tablet (20 mg total) by mouth at bedtime.  Modified Medications   No medications on file  Discontinued Medications   No medications on file    Review of Systems  Gastrointestinal: Positive for rectal pain.  All other systems reviewed and are negative.   Vitals:   12/24/16 0932  BP: 118/80  Pulse: 65  Temp: 97.7 F (36.5 C)  TempSrc: Oral  SpO2: 97%  Weight: 236 lb 3.2 oz (107.1 kg)  Height: _0  (1.88 m)   Body mass index is 30.33 kg/m.  Physical Exam  Constitutional: He is oriented to person, place, and time. He appears well-developed and well-nourished.  HENT:  Mouth/Throat: Oropharynx is clear and moist.  Eyes: Pupils are equal, round, and reactive to light. No scleral icterus.  Neck: Neck supple. Carotid bruit is not present. No thyromegaly present.  Cardiovascular: Normal rate, regular rhythm, normal heart  sounds and intact distal pulses.  Exam reveals no gallop and no friction rub.   No murmur heard. no distal LE swelling. No calf TTP  Pulmonary/Chest: Effort normal and breath sounds normal. He has no wheezes. He has  no rales. He exhibits no tenderness.  Abdominal: Soft. Bowel sounds are normal. He exhibits pulsatile midline mass. He exhibits no distension, no abdominal bruit and no mass. There is no hepatomegaly. There is no tenderness. There is no rebound and no guarding.  Pulsatile mass  Lymphadenopathy:    He has no cervical adenopathy.  Neurological: He is alert and oriented to person, place, and time. He has normal reflexes.  Skin: Skin is warm and dry. No rash noted.  Psychiatric: He has a normal mood and affect. His behavior is normal. Judgment and thought content normal.     Labs reviewed: Office Visit on 12/12/2016  Component Date Value Ref Range Status  . WBC 12/12/2016 8.7  3.8 - 10.8 K/uL Final  . RBC 12/12/2016 5.09  4.20 - 5.80 MIL/uL Final  . Hemoglobin 12/12/2016 15.1  13.2 - 17.1 g/dL Final  . HCT 12/12/2016 44.7  38.5 - 50.0 % Final  . MCV 12/12/2016 87.8  80.0 - 100.0 fL Final  . MCH 12/12/2016 29.7  27.0 - 33.0 pg Final  . MCHC 12/12/2016 33.8  32.0 - 36.0 g/dL Final  . RDW 12/12/2016 12.9  11.0 - 15.0 % Final  . Platelets 12/12/2016 320  140 - 400 K/uL Final  . MPV 12/12/2016 9.3  7.5 - 12.5 fL Final  . Neutro Abs 12/12/2016 5742  1,500 - 7,800 cells/uL Final  . Lymphs Abs 12/12/2016 2001  850 - 3,900 cells/uL Final  . Monocytes Absolute 12/12/2016 783  200 - 950 cells/uL Final  . Eosinophils Absolute 12/12/2016 87  15 - 500 cells/uL Final  . Basophils Absolute 12/12/2016 87  0 - 200 cells/uL Final  . Neutrophils Relative % 12/12/2016 66  % Final  . Lymphocytes Relative 12/12/2016 23  % Final  . Monocytes Relative 12/12/2016 9  % Final  . Eosinophils Relative 12/12/2016 1  % Final  . Basophils Relative 12/12/2016 1  % Final  . Smear Review 12/12/2016 Criteria  for review not met   Final    No results found.   Assessment/Plan   ICD-9-CM ICD-10-CM   1. Gastroesophageal reflux disease, esophagitis presence not specified 530.81 K21.9 pantoprazole (PROTONIX) 40 MG tablet  2. Essential hypertension 401.9 I10 CMP with eGFR  3. Mixed hyperlipidemia 272.2 E78.2 Lipid Panel     TSH  4. AAA (abdominal aortic aneurysm) without rupture (HCC) 441.4 I71.4   5. Thrombosed external hemorrhoid 455.4 K64.5   6. Uncomplicated asthma, unspecified asthma severity, unspecified whether persistent 493.90 J45.909   7. High risk medication use V58.69 Z79.899 CMP with eGFR  8. Prostate cancer screening V76.44 Z12.5 PSA   Follow up with specialists as scheduled  STOP omeprazole OTC  START pantoprazole daily for acid reflux  Continue other medications as ordered  Will call with lab results  Medically cleared for hemorrhoid surgery  Follow up in 6 mos with Janett Billow for [employer]. Perlie Gold  [facility] [address] [place], [place] [address] [phone] Cell (Monday-Friday 8 AM - 5 PM) [phone] After 5 PM and follow prompts

## 2016-12-25 ENCOUNTER — Other Ambulatory Visit: Payer: Self-pay

## 2016-12-25 DIAGNOSIS — K219 Gastro-esophageal reflux disease without esophagitis: Secondary | ICD-10-CM

## 2016-12-25 MED ORDER — PANTOPRAZOLE SODIUM 40 MG PO TBEC: 40.0000 mg | DELAYED_RELEASE_TABLET | Freq: Every day | ORAL | 3 refills | Status: DC

## 2016-12-25 NOTE — Telephone Encounter (Signed)
Patient left message questioning which pharmacy rx for protonix was sent to. Patient went to [facility] on [address] and they did not have record of rx.  RX was sent to [facility]. I corrected and sent to preferred pharmacy. Patient informed

## 2016-12-30 NOTE — H&P (Signed)
  Aaron [name] 12/17/2016 2:04 PM Location: [facility] Patient #: [id number] DOB: Jan 20, 1956 Married / Language: English / Race: White Male   History of Present Illness (Laylaa Guevarra A. Ninfa Linden MD; 12/17/2016 2:48 PM) The patient is a 61 year old male who presents with hemorrhoids. This gentleman is referred to our emergency office by Dr. Harl Bowie for a bleeding external hemorrhoid. The patient reports he has had problems with hemorrhoids for possibly 4-5 years. He reports the sensation of when sticking out on occasion. Last week, he had a stick out but did not go down. He reports it is really swollen yesterday and is started bleed and regressed. He showed me pictures of it from yesterday and today. When he went to the gastroenterologist office, it was still actively bleeding so he was referred to our office. He now reports no further bleeding. He reports minimal perianal discomfort   Allergies Nance Pear, [place]; 12/17/2016 2:06 PM) No Known Drug Allergies 12/17/2016  Medication History Nance Pear, [place]; 12/17/2016 2:09 PM) Albuterol Sulfate HFA (108 (90 Base)MCG/ACT Aerosol Soln, Inhalation as needed) Active. Aspirin (81MG  Tablet, Oral daily) Active. Hydrocodone-Acetaminophen (10-325MG  Tablet, Oral as needed) Active. Anusol-HC (2.5% Cream, Rectal daily) Active. Proctofoam (1% Foam, Rectal daily) Active. Cozaar (50MG  Tablet, Oral daily) Active. Dulera (200-5MCG/ACT Aerosol, Inhalation daily) Active. PriLOSEC (20MG  Capsule DR, Oral daily) Active. Viagra (50MG  Tablet, Oral daily) Active. Zocor (20MG  Tablet, Oral daily) Active. Tindamax (250MG  Tablet, Oral daily) Active. Medications Reconciled  Vitals (Sade Bradford CMA; 12/17/2016 2:10 PM) 12/17/2016 2:10 PM Weight: 236.4 lb Height: 74in Body Surface Area: 2.33 m Body Mass Index: 30.35 kg/m  Temp.: 98.36F  Pulse: 87 (Regular)  BP: 124/88 (Sitting, Left Arm,  Standard)       Physical Exam (Skyah Hannon A. Ninfa Linden MD; 12/17/2016 2:48 PM) The physical exam findings are as follows: Note:On examination, he has an edematous hemorrhoid externally which is also thrombosed but has spontaneously ruptured on its own. There is no current active bleeding. I did not do a digital exam for fear of starting more bleeding Lungs clear CV RRR Abdomen soft, NT/ND Skin without rash    Assessment & Plan (Nashon Erbes A. Ninfa Linden MD; 12/17/2016 2:49 PM) THROMBOSED EXTERNAL HEMORRHOID (K64.5) Impression: I explained the diagnosis to him in detail. He apparently has some internal hemorrhoids as well. He is interested in hemorrhoidectomy given how long he has had this problem. I do believe this is reasonable. He will go ahead and continue the sitz baths, stool softeners, newly prescribed steroid cream, etc. I discussed the procedure with him in detail. I discussed the risks of surgery which includes but is not limited to bleeding, infection, recurrence, etc. I can also band internal hemorrhoids if necessary at the time of the procedure. Surgery will be scheduled

## 2016-12-31 ENCOUNTER — Ambulatory Visit (HOSPITAL_BASED_OUTPATIENT_CLINIC_OR_DEPARTMENT_OTHER): Payer: 59 | Admitting: Certified Registered"

## 2016-12-31 ENCOUNTER — Encounter (HOSPITAL_BASED_OUTPATIENT_CLINIC_OR_DEPARTMENT_OTHER): Payer: Self-pay | Admitting: Anesthesiology

## 2016-12-31 ENCOUNTER — Encounter (HOSPITAL_BASED_OUTPATIENT_CLINIC_OR_DEPARTMENT_OTHER)
Admission: RE | Disposition: A | Discharge status: Home / Self Care | Payer: Self-pay | Source: Ambulatory Visit | Attending: Surgery

## 2016-12-31 ENCOUNTER — Ambulatory Visit (HOSPITAL_BASED_OUTPATIENT_CLINIC_OR_DEPARTMENT_OTHER)
Admission: RE | Admit: 2016-12-31 | Discharge: 2016-12-31 | Disposition: A | Discharge status: Home / Self Care | Payer: 59 | Source: Ambulatory Visit | Attending: Surgery | Admitting: Surgery

## 2016-12-31 DIAGNOSIS — K644 Residual hemorrhoidal skin tags: Secondary | ICD-10-CM | POA: Insufficient documentation

## 2016-12-31 DIAGNOSIS — I10 Essential (primary) hypertension: Secondary | ICD-10-CM | POA: Diagnosis not present

## 2016-12-31 DIAGNOSIS — K219 Gastro-esophageal reflux disease without esophagitis: Secondary | ICD-10-CM | POA: Diagnosis not present

## 2016-12-31 DIAGNOSIS — K648 Other hemorrhoids: Secondary | ICD-10-CM | POA: Diagnosis present

## 2016-12-31 DIAGNOSIS — Z79899 Other long term (current) drug therapy: Secondary | ICD-10-CM | POA: Diagnosis not present

## 2016-12-31 DIAGNOSIS — Z87891 Personal history of nicotine dependence: Secondary | ICD-10-CM | POA: Insufficient documentation

## 2016-12-31 DIAGNOSIS — J45909 Unspecified asthma, uncomplicated: Secondary | ICD-10-CM | POA: Diagnosis not present

## 2016-12-31 DIAGNOSIS — K645 Perianal venous thrombosis: Secondary | ICD-10-CM

## 2016-12-31 DIAGNOSIS — Z7982 Long term (current) use of aspirin: Secondary | ICD-10-CM | POA: Insufficient documentation

## 2016-12-31 HISTORY — DX: Unspecified hemorrhoids: K64.9

## 2016-12-31 HISTORY — PX: EVALUATION UNDER ANESTHESIA WITH HEMORRHOIDECTOMY: SHX5624

## 2016-12-31 SURGERY — EXAM UNDER ANESTHESIA WITH HEMORRHOIDECTOMY
Anesthesia: General

## 2016-12-31 MED ORDER — FENTANYL CITRATE (PF) 100 MCG/2ML IJ SOLN
INTRAMUSCULAR | Status: AC
  Filled 2016-12-31: qty 2

## 2016-12-31 MED ORDER — LIDOCAINE 2% (20 MG/ML) 5 ML SYRINGE
INTRAMUSCULAR | Status: AC
  Filled 2016-12-31: qty 5

## 2016-12-31 MED ORDER — SODIUM CHLORIDE 0.9% FLUSH: 3.0000 mL | Freq: Two times a day (BID) | INTRAVENOUS | Status: DC

## 2016-12-31 MED ORDER — ACETAMINOPHEN 650 MG RE SUPP: 650.0000 mg | RECTAL | Status: DC | PRN

## 2016-12-31 MED ORDER — MIDAZOLAM HCL 5 MG/5ML IJ SOLN
INTRAMUSCULAR | Status: DC | PRN
  Administered 2016-12-31: 2 mg via INTRAVENOUS

## 2016-12-31 MED ORDER — DIBUCAINE 1 % RE OINT
TOPICAL_OINTMENT | RECTAL | Status: AC
Start: 2016-12-31 — End: 2016-12-31
  Filled 2016-12-31: qty 28

## 2016-12-31 MED ORDER — LIDOCAINE 5 % EX OINT: 1.0000 "application " | TOPICAL_OINTMENT | Freq: Four times a day (QID) | CUTANEOUS | 0 refills | Status: DC | PRN

## 2016-12-31 MED ORDER — MIDAZOLAM HCL 2 MG/2ML IJ SOLN
INTRAMUSCULAR | Status: AC
  Filled 2016-12-31: qty 2

## 2016-12-31 MED ORDER — LIDOCAINE HCL (CARDIAC) 20 MG/ML IV SOLN
INTRAVENOUS | Status: DC | PRN
  Administered 2016-12-31: 30 mg via INTRAVENOUS

## 2016-12-31 MED ORDER — CHLORHEXIDINE GLUCONATE CLOTH 2 % EX PADS: 6.0000 | MEDICATED_PAD | Freq: Once | CUTANEOUS | Status: DC

## 2016-12-31 MED ORDER — LACTATED RINGERS IV SOLN
INTRAVENOUS | Status: DC
  Administered 2016-12-31: 14:00:00 via INTRAVENOUS

## 2016-12-31 MED ORDER — CEFAZOLIN SODIUM-DEXTROSE 2-4 GM/100ML-% IV SOLN
INTRAVENOUS | Status: AC
  Filled 2016-12-31: qty 100

## 2016-12-31 MED ORDER — PROPOFOL 10 MG/ML IV BOLUS
INTRAVENOUS | Status: AC
  Filled 2016-12-31: qty 20

## 2016-12-31 MED ORDER — SCOPOLAMINE 1 MG/3DAYS TD PT72: 1.0000 | MEDICATED_PATCH | Freq: Once | TRANSDERMAL | Status: DC | PRN

## 2016-12-31 MED ORDER — KETOROLAC TROMETHAMINE 30 MG/ML IJ SOLN
INTRAMUSCULAR | Status: AC
  Filled 2016-12-31: qty 1

## 2016-12-31 MED ORDER — ACETAMINOPHEN 325 MG PO TABS: 650.0000 mg | ORAL_TABLET | ORAL | Status: DC | PRN

## 2016-12-31 MED ORDER — DEXAMETHASONE SODIUM PHOSPHATE 10 MG/ML IJ SOLN
INTRAMUSCULAR | Status: AC
  Filled 2016-12-31: qty 1

## 2016-12-31 MED ORDER — BUPIVACAINE LIPOSOME 1.3 % IJ SUSP
INTRAMUSCULAR | Status: AC
  Filled 2016-12-31: qty 20

## 2016-12-31 MED ORDER — MIDAZOLAM HCL 2 MG/2ML IJ SOLN: 1.0000 mg | INTRAMUSCULAR | Status: DC | PRN

## 2016-12-31 MED ORDER — SODIUM CHLORIDE 0.9% FLUSH: 3.0000 mL | INTRAVENOUS | Status: DC | PRN

## 2016-12-31 MED ORDER — FENTANYL CITRATE (PF) 100 MCG/2ML IJ SOLN: 50.0000 ug | INTRAMUSCULAR | Status: DC | PRN

## 2016-12-31 MED ORDER — MORPHINE SULFATE (PF) 2 MG/ML IV SOLN: 1.0000 mg | INTRAVENOUS | Status: DC | PRN

## 2016-12-31 MED ORDER — ONDANSETRON HCL 4 MG/2ML IJ SOLN
INTRAMUSCULAR | Status: DC | PRN
  Administered 2016-12-31: 4 mg via INTRAVENOUS

## 2016-12-31 MED ORDER — CEFAZOLIN SODIUM-DEXTROSE 2-3 GM-% IV SOLR
INTRAVENOUS | Status: DC | PRN
Start: 2016-12-31 — End: 2016-12-31
  Administered 2016-12-31: 2 g via INTRAVENOUS

## 2016-12-31 MED ORDER — HYDROCODONE-ACETAMINOPHEN 10-325 MG PO TABS: 1.0000 | ORAL_TABLET | ORAL | 0 refills | Status: DC | PRN

## 2016-12-31 MED ORDER — FENTANYL CITRATE (PF) 100 MCG/2ML IJ SOLN
25.0000 ug | INTRAMUSCULAR | Status: DC | PRN
  Administered 2016-12-31: 50 ug via INTRAVENOUS

## 2016-12-31 MED ORDER — DEXAMETHASONE SODIUM PHOSPHATE 4 MG/ML IJ SOLN
INTRAMUSCULAR | Status: DC | PRN
  Administered 2016-12-31: 10 mg via INTRAVENOUS

## 2016-12-31 MED ORDER — PROMETHAZINE HCL 25 MG/ML IJ SOLN: 6.2500 mg | INTRAMUSCULAR | Status: DC | PRN

## 2016-12-31 MED ORDER — LACTATED RINGERS IV SOLN
INTRAVENOUS | Status: DC | PRN
  Administered 2016-12-31 (×2): via INTRAVENOUS

## 2016-12-31 MED ORDER — CEFAZOLIN SODIUM-DEXTROSE 2-4 GM/100ML-% IV SOLN: 2.0000 g | INTRAVENOUS | Status: DC

## 2016-12-31 MED ORDER — FENTANYL CITRATE (PF) 100 MCG/2ML IJ SOLN
INTRAMUSCULAR | Status: DC | PRN
  Administered 2016-12-31: 100 ug via INTRAVENOUS

## 2016-12-31 MED ORDER — OXYCODONE HCL 5 MG PO TABS: 5.0000 mg | ORAL_TABLET | ORAL | Status: DC | PRN

## 2016-12-31 MED ORDER — PROPOFOL 10 MG/ML IV BOLUS
INTRAVENOUS | Status: DC | PRN
  Administered 2016-12-31: 200 mg via INTRAVENOUS

## 2016-12-31 MED ORDER — ONDANSETRON HCL 4 MG/2ML IJ SOLN
INTRAMUSCULAR | Status: AC
  Filled 2016-12-31: qty 2

## 2016-12-31 MED ORDER — SODIUM CHLORIDE 0.9 % IV SOLN: 250.0000 mL | INTRAVENOUS | Status: DC | PRN

## 2016-12-31 MED ORDER — BUPIVACAINE LIPOSOME 1.3 % IJ SUSP
INTRAMUSCULAR | Status: DC | PRN
  Administered 2016-12-31: 20 mL

## 2016-12-31 SURGICAL SUPPLY — 33 items
BLADE SURG 15 STRL LF DISP TIS (BLADE) ×1 IMPLANT
BLADE SURG 15 STRL SS (BLADE) ×1
CANISTER SUCT 1200ML W/VALVE (MISCELLANEOUS) ×2 IMPLANT
COVER MAYO STAND STRL (DRAPES) IMPLANT
DECANTER SPIKE VIAL GLASS SM (MISCELLANEOUS) IMPLANT
DRAPE UTILITY XL STRL (DRAPES) ×2 IMPLANT
DRSG PAD ABDOMINAL 8X10 ST (GAUZE/BANDAGES/DRESSINGS) ×2 IMPLANT
ELECT REM PT RETURN 9FT ADLT (ELECTROSURGICAL) ×2
ELECTRODE REM PT RTRN 9FT ADLT (ELECTROSURGICAL) ×1 IMPLANT
GLOVE SURG SIGNA 7.5 PF LTX (GLOVE) ×2 IMPLANT
GOWN STRL REUS W/ TWL LRG LVL3 (GOWN DISPOSABLE) ×1 IMPLANT
GOWN STRL REUS W/TWL LRG LVL3 (GOWN DISPOSABLE) ×1
NEEDLE HYPO 25X1 1.5 SAFETY (NEEDLE) ×2 IMPLANT
PACK BASIN DAY SURGERY FS (CUSTOM PROCEDURE TRAY) ×2 IMPLANT
PACK LITHOTOMY IV (CUSTOM PROCEDURE TRAY) ×2 IMPLANT
PENCIL BUTTON HOLSTER BLD 10FT (ELECTRODE) ×2 IMPLANT
SHEARS HARMONIC 9CM CVD (BLADE) IMPLANT
SHEET MEDIUM DRAPE 40X70 STRL (DRAPES) ×2 IMPLANT
SPONGE GAUZE 4X4 12PLY STER LF (GAUZE/BANDAGES/DRESSINGS) ×2 IMPLANT
SPONGE HEMORRHOID 8X3CM (HEMOSTASIS) ×2 IMPLANT
SPONGE SURGIFOAM ABS GEL 100 (HEMOSTASIS) IMPLANT
SPONGE SURGIFOAM ABS GEL 12-7 (HEMOSTASIS) IMPLANT
SURGILUBE 2OZ TUBE FLIPTOP (MISCELLANEOUS) ×2 IMPLANT
SUT CHROMIC 2 0 SH (SUTURE) ×4 IMPLANT
SUT CHROMIC 3 0 SH 27 (SUTURE) ×2 IMPLANT
SYR CONTROL 10ML LL (SYRINGE) ×2 IMPLANT
TOWEL OR 17X24 6PK STRL BLUE (TOWEL DISPOSABLE) ×2 IMPLANT
TOWEL OR NON WOVEN STRL DISP B (DISPOSABLE) IMPLANT
TRAY DSU PREP LF (CUSTOM PROCEDURE TRAY) ×2 IMPLANT
TRAY PROCTOSCOPIC FIBER OPTIC (SET/KITS/TRAYS/PACK) IMPLANT
TUBE CONNECTING 20X1/4 (TUBING) ×2 IMPLANT
UNDERPAD 30X30 (UNDERPADS AND DIAPERS) ×2 IMPLANT
YANKAUER SUCT BULB TIP NO VENT (SUCTIONS) ×2 IMPLANT

## 2016-12-31 NOTE — Anesthesia Preprocedure Evaluation (Addendum)
Anesthesia Evaluation  Patient identified by MRN, date of birth, ID band Patient awake    Reviewed: Allergy & Precautions, NPO status , Patient's Chart, lab work & pertinent test results  Airway Mallampati: II  TM Distance: >3 FB Neck ROM: Full    Dental  (+) Dental Advisory Given   Pulmonary asthma , former smoker,    breath sounds clear to auscultation       Cardiovascular hypertension, Pt. on medications + Peripheral Vascular Disease   Rhythm:Regular Rate:Normal     Neuro/Psych negative neurological ROS     GI/Hepatic Neg liver ROS, hiatal hernia, GERD  ,  Endo/Other  negative endocrine ROS  Renal/GU negative Renal ROS     Musculoskeletal  (+) Arthritis ,   Abdominal   Peds  Hematology negative hematology ROS (+)   Anesthesia Other Findings   Reproductive/Obstetrics                            Anesthesia Physical Anesthesia Plan  ASA: III  Anesthesia Plan: General   Post-op Pain Management:    Induction: Intravenous  Airway Management Planned: LMA  Additional Equipment:   Intra-op Plan:   Post-operative Plan: Extubation in OR  Informed Consent: I have reviewed the patients History and Physical, chart, labs and discussed the procedure including the risks, benefits and alternatives for the proposed anesthesia with the patient or authorized representative who has indicated his/her understanding and acceptance.   Dental advisory given  Plan Discussed with: CRNA  Anesthesia Plan Comments:         Anesthesia Quick Evaluation

## 2016-12-31 NOTE — Op Note (Signed)
EXAM UNDER ANESTHESIA WITH HEMORRHOIDECTOMY  Procedure Note  Aaron [name] 12/31/2016   Pre-op Diagnosis: bleeding hemorrhoids     Post-op Diagnosis: same  Procedure(s): EXAM UNDER ANESTHESIA WITH TWO COLUMN INTERNAL AND EXTERNAL HEMORRHOIDECTOMY  Surgeon(s): [name], MD  Anesthesia: General  Staff:  Circulator: Leonidas Romberg, RN Scrub Person: Lorenza Burton, [place] Circulator Assistant: Izora Ribas, RN  Estimated Blood Loss: Minimal               Specimens: sent to path  Findings: The patient was found to have 2 very enlarged inflamed internal and external hemorrhoids which were excised. This included the area of his previous thrombosis. No other intracranial pathology was identified.  Procedure: The patient was brought to the operating room and identified as the correct patient. He was placed supine on the operating room table and general anesthesia was induced. The patient was then placed in the lithotomy position. His perianal area was then prepped and draped in usual sterile fashion. The patient had 2 IVC enlarged external hemorrhoids. The one on the right side showed the open wound from his previous thrombosed hemorrhoid. I inserted [name] retractor in the anal canal and circumferentially inspected the anal canal. The patient had 2 large internal and external hemorrhoidal columns. The rest of his enteral canal appeared normal. I excised both the internal and external hemorrhoidal columns on the right side and left side with the harmonic scalpel. I then closed both defects with running interlocked 2-0 chromic sutures. Hemostasis appeared to be achieved. I injected Experal around the incisions and circumferentially around the anus. I placed [name] piece of Gelfoam into the anal canal to aid with hemostasis as well. He tolerated the procedure well. All counts were correct at the end procedure. He was then extubated in the operating room and taken in [name] stable condition to the recovery  room.          Aaron [name]   Date: 12/31/2016  Time: 2:32 PM

## 2016-12-31 NOTE — Transfer of Care (Signed)
Immediate Anesthesia Transfer of Care Note  Patient: Aaron [name]  Procedure(s) Performed: Procedure(s): EXAM UNDER ANESTHESIA WITH HEMORRHOIDECTOMY (N/A)  Patient Location: PACU  Anesthesia Type:General  Level of Consciousness: awake, alert , oriented and patient cooperative  Airway & Oxygen Therapy: Patient Spontanous Breathing and Patient connected to face mask oxygen  Post-op Assessment: Report given to RN and Post -op Vital signs reviewed and stable  Post vital signs: Reviewed and stable  Last Vitals:  Vitals:   12/31/16 1321 12/31/16 1436  BP: 120/74 120/83  Pulse: (!) 59 75  Resp: 18   Temp: 36.7 C     Last Pain:  Vitals:   12/31/16 1321  TempSrc: Oral         Complications: No apparent anesthesia complications

## 2016-12-31 NOTE — Interval H&P Note (Signed)
History and Physical Interval Note: no change in H and P  12/31/2016 1:56 PM  Aaron [name]  has presented today for surgery, with the diagnosis of bleeding hemorrhoids  The various methods of treatment have been discussed with the patient and family. After consideration of risks, benefits and other options for treatment, the patient has consented to  Procedure(s): EXAM UNDER ANESTHESIA WITH HEMORRHOIDECTOMY (N/A) as a surgical intervention .  The patient's history has been reviewed, patient examined, no change in status, stable for surgery.  I have reviewed the patient's chart and labs.  Questions were answered to the patient's satisfaction.     Halli Equihua A

## 2016-12-31 NOTE — Anesthesia Procedure Notes (Signed)
Procedure Name: LMA Insertion Date/Time: 12/31/2016 2:03 PM Performed by: Toula Moos L Pre-anesthesia Checklist: Patient identified, Emergency Drugs available, Suction available, Patient being monitored and Timeout performed Patient Re-evaluated:Patient Re-evaluated prior to inductionOxygen Delivery Method: Circle system utilized Preoxygenation: Pre-oxygenation with 100% oxygen Intubation Type: IV induction Ventilation: Mask ventilation without difficulty LMA: LMA inserted LMA Size: 5.0 Number of attempts: 1 Airway Equipment and Method: Bite block Placement Confirmation: positive ETCO2 Tube secured with: Tape Dental Injury: Teeth and Oropharynx as per pre-operative assessment

## 2016-12-31 NOTE — Anesthesia Postprocedure Evaluation (Signed)
Anesthesia Post Note  Patient: Aaron [name]  Procedure(s) Performed: Procedure(s) (LRB): EXAM UNDER ANESTHESIA WITH HEMORRHOIDECTOMY (N/A)  Patient location during evaluation: PACU Anesthesia Type: General Level of consciousness: awake and alert Pain management: pain level controlled Vital Signs Assessment: post-procedure vital signs reviewed and stable Respiratory status: spontaneous breathing, nonlabored ventilation, respiratory function stable and patient connected to nasal cannula oxygen Cardiovascular status: blood pressure returned to baseline and stable Postop Assessment: no signs of nausea or vomiting Anesthetic complications: no       Last Vitals:  Vitals:   12/31/16 1500 12/31/16 1546  BP: 128/81 140/86  Pulse: 69 60  Resp: 16 20  Temp:  36.7 C    Last Pain:  Vitals:   12/31/16 1546  TempSrc: Oral  PainSc:                  Tiajuana Amass

## 2016-12-31 NOTE — Discharge Instructions (Signed)
[facility] _______[facility]  RECTAL SURGERY POST OP INSTRUCTIONS: POST OP INSTRUCTIONS  Always review your discharge instruction sheet given to you by the facility where your surgery was performed. IF YOU HAVE DISABILITY OR FAMILY LEAVE FORMS, YOU MUST BRING THEM TO THE OFFICE FOR PROCESSING.   DO NOT GIVE THEM TO YOUR DOCTOR.  1. A  prescription for pain medication may be given to you upon discharge.  Take your pain medication as prescribed, if needed.  If narcotic pain medicine is not needed, then you may take acetaminophen (Tylenol) or ibuprofen (Advil) as needed. 2. Take your usually prescribed medications unless otherwise directed. 3. If you need a refill on your pain medication, please contact your pharmacy.  They will contact our office to request authorization. Prescriptions will not be filled after 5 pm or on week-ends. 4. You should follow a light diet the first 48 hours after arrival home, such as soup and crackers, etc.  Be sure to include lots of fluids daily.  Resume your normal diet 2-3 days after surgery.. 5. Most patients will experience some swelling and discomfort in the rectal area. Ice packs, reclining and warm tub soaks will help.  Swelling and discomfort can take several days to resolve.  6. It is common to experience some constipation if taking pain medication after surgery.  Increasing fluid intake and taking a stool softener (such as Colace) will usually help or prevent this problem from occurring.  A mild laxative (Milk of Magnesia or Miralax) should be taken according to package directions if there are no bowel movements after 48 hours. 7. Unless discharge instructions indicate otherwise, leave your bandage dry and in place for 24 hours, or remove the bandage if you have a bowel movement. You may notice a small amount of bleeding with bowel movements for the first few days. You may have some packing in the rectum which will come out over the first day or two. You  will need to wear an absorbent pad or soft cotton gauze in your underwear until the drainage stops.it. 8. ACTIVITIES:  You may resume regular (light) daily activities beginning the next day--such as daily self-care, walking, climbing stairs--gradually increasing activities as tolerated.  You may have sexual intercourse when it is comfortable.  Refrain from any heavy lifting or straining until approved by your doctor. a. You may drive when you are no longer taking prescription pain medication, you can comfortably wear a seatbelt, and you can safely maneuver your car and apply brakes. b. RETURN TO WORK: : ____________________ c.  9. You should see your doctor in the office for a follow-up appointment approximately 2-3 weeks after your surgery.  Make sure that you call for this appointment within a day or two after you arrive home to insure a convenient appointment time. 10. OTHER INSTRUCTIONS: TAKE MIRALAX AS STOOL SOFTENER DAILY 11. IBUPROFEN ALSO FOR PAIN 12. CAN RESUME SITZ BATHS FOR PAIN ON FRIDAY __________________________________________________________________________________________________________________________________________________________________________________________  WHEN TO CALL YOUR DOCTOR: 1. Fever over 101.0 2. Inability to urinate 3. Nausea and/or vomiting 4. Extreme swelling or bruising 5. Continued bleeding from rectum. 6. Increased pain, redness, or drainage from the incision 7. Constipation  The clinic staff is available to answer your questions during regular business hours.  Please dont hesitate to call and ask to speak to one of the nurses for clinical concerns.  If you have a medical emergency, go to the nearest emergency room or call 911.  A surgeon from [facility]  is always on call at the hospital   [address] [place], [place], [place]  [address] ?  [address], [place], [place]   [address] [phone] ? [phone] ? FAX [phone]  [phone] Web site: [email or web address]      Post Anesthesia Home Care Instructions  Activity: Get plenty of rest for the remainder of the day. A responsible adult should stay with you for 24 hours following the procedure.  For the next 24 hours, DO NOT: -Drive a car -Paediatric nurse -Drink alcoholic beverages -Take any medication unless instructed by your physician -Make any legal decisions or sign important papers.  Meals: Start with liquid foods such as gelatin or soup. Progress to regular foods as tolerated. Avoid greasy, spicy, heavy foods. If nausea and/or vomiting occur, drink only clear liquids until the nausea and/or vomiting subsides. Call your physician if vomiting continues.  Special Instructions/Symptoms: Your throat may feel dry or sore from the anesthesia or the breathing tube placed in your throat during surgery. If this causes discomfort, gargle with warm salt water. The discomfort should disappear within 24 hours.  If you had a scopolamine patch placed behind your ear for the management of post- operative nausea and/or vomiting:  1. The medication in the patch is effective for 72 hours, after which it should be removed.  Wrap patch in a tissue and discard in the trash. Wash hands thoroughly with soap and water. 2. You may remove the patch earlier than 72 hours if you experience unpleasant side effects which may include dry mouth, dizziness or visual disturbances. 3. Avoid touching the patch. Wash your hands with soap and water after contact with the patch.     Information for Discharge Teaching: EXPAREL (bupivacaine liposome injectable suspension)   Your surgeon gave you EXPAREL(bupivacaine) in your surgical incision to help control your pain after surgery.   EXPAREL is a local anesthetic that provides pain relief by numbing the tissue around the surgical site.  EXPAREL is designed to release pain medication over time and can control pain for up to  72 hours.  Depending on how you respond to EXPAREL, you may require less pain medication during your recovery.  Possible side effects:  Temporary loss of sensation or ability to move in the area where bupivacaine was injected.  Nausea, vomiting, constipation  Rarely, numbness and tingling in your mouth or lips, lightheadedness, or anxiety may occur.  Call your doctor right away if you think you may be experiencing any of these sensations, or if you have other questions regarding possible side effects.  Follow all other discharge instructions given to you by your surgeon or nurse. Eat a healthy diet and drink plenty of water or other fluids.  If you return to the hospital for any reason within 96 hours following the administration of EXPAREL, please inform your health care providers.

## 2017-01-01 ENCOUNTER — Encounter (HOSPITAL_BASED_OUTPATIENT_CLINIC_OR_DEPARTMENT_OTHER): Payer: Self-pay | Admitting: Surgery

## 2017-01-02 ENCOUNTER — Encounter: Payer: 59 | Admitting: Gastroenterology

## 2017-01-03 ENCOUNTER — Other Ambulatory Visit: Payer: Self-pay | Admitting: Gastroenterology

## 2017-02-21 ENCOUNTER — Other Ambulatory Visit: Payer: Self-pay | Admitting: Nurse Practitioner

## 2017-02-21 DIAGNOSIS — I10 Essential (primary) hypertension: Secondary | ICD-10-CM

## 2017-02-24 ENCOUNTER — Other Ambulatory Visit: Payer: Self-pay | Admitting: Nurse Practitioner

## 2017-02-24 DIAGNOSIS — I10 Essential (primary) hypertension: Secondary | ICD-10-CM

## 2017-03-08 ENCOUNTER — Other Ambulatory Visit: Payer: Self-pay | Admitting: Nurse Practitioner

## 2017-03-08 DIAGNOSIS — E785 Hyperlipidemia, unspecified: Secondary | ICD-10-CM

## 2017-04-21 ENCOUNTER — Other Ambulatory Visit: Payer: Self-pay | Admitting: Internal Medicine

## 2017-04-21 DIAGNOSIS — K219 Gastro-esophageal reflux disease without esophagitis: Secondary | ICD-10-CM

## 2017-05-04 ENCOUNTER — Telehealth: Payer: Self-pay

## 2017-05-04 NOTE — Telephone Encounter (Signed)
A fax was received from [facility] requesting a prior authorization for dulera 200 mcg/5 mcg inhaler. PA was initiated via [email or web address].   Keyword: [id number] Pt ID: [id number]  Awaiting determination  Request forms were placed in prior authorization folder at Clinical Intake.

## 2017-05-05 DIAGNOSIS — Z09 Encounter for follow-up examination after completed treatment for conditions other than malignant neoplasm: Secondary | ICD-10-CM | POA: Diagnosis not present

## 2017-05-05 NOTE — Telephone Encounter (Signed)
Received fax from [facility] [phone]  File ID: [id number] and Ruthe Mannan has been APPROVED until 05/04/18

## 2017-05-13 ENCOUNTER — Other Ambulatory Visit: Payer: Self-pay | Admitting: Nurse Practitioner

## 2017-05-13 ENCOUNTER — Telehealth: Payer: Self-pay | Admitting: *Deleted

## 2017-05-13 MED ORDER — FLUTICASONE-SALMETEROL 113-14 MCG/ACT IN AEPB: 1.0000 | INHALATION_SPRAY | Freq: Two times a day (BID) | RESPIRATORY_TRACT | 6 refills | Status: DC

## 2017-05-13 NOTE — Telephone Encounter (Signed)
rx sent to pharmacy

## 2017-05-13 NOTE — Telephone Encounter (Signed)
Patient called and stated that he cannot afford the [facility] inhaler. Stated that even after the Prior Authorization it is going to be $200/month. No samples available. Would like something else. Please Advise.

## 2017-05-13 NOTE — Telephone Encounter (Signed)
Patient notified and wanted Rx faxed to [facility]

## 2017-05-14 NOTE — Telephone Encounter (Signed)
Patient called and stated the AirDuo you called in for him costs $100/month and he can't afford this.  I instructed him to call his insurance and ask them what they will cover that is comparable and to call us back. He agreed and will call us with that information.

## 2017-05-14 NOTE — Telephone Encounter (Signed)
Okay, that is what was on the form they sent [place] so that may be the cheapest alternative

## 2017-05-29 ENCOUNTER — Other Ambulatory Visit: Payer: Self-pay | Admitting: Internal Medicine

## 2017-05-29 DIAGNOSIS — K219 Gastro-esophageal reflux disease without esophagitis: Secondary | ICD-10-CM

## 2017-06-17 ENCOUNTER — Other Ambulatory Visit: Payer: Self-pay | Admitting: Physician Assistant

## 2017-06-17 DIAGNOSIS — D229 Melanocytic nevi, unspecified: Secondary | ICD-10-CM | POA: Diagnosis not present

## 2017-06-17 DIAGNOSIS — L918 Other hypertrophic disorders of the skin: Secondary | ICD-10-CM | POA: Diagnosis not present

## 2017-06-17 DIAGNOSIS — Z85828 Personal history of other malignant neoplasm of skin: Secondary | ICD-10-CM | POA: Diagnosis not present

## 2017-06-17 DIAGNOSIS — D492 Neoplasm of unspecified behavior of bone, soft tissue, and skin: Secondary | ICD-10-CM | POA: Diagnosis not present

## 2017-06-23 ENCOUNTER — Encounter: Payer: 59 | Admitting: Nurse Practitioner

## 2017-06-24 ENCOUNTER — Ambulatory Visit (INDEPENDENT_AMBULATORY_CARE_PROVIDER_SITE_OTHER): Payer: 59 | Admitting: Nurse Practitioner

## 2017-06-24 ENCOUNTER — Encounter: Payer: Self-pay | Admitting: Nurse Practitioner

## 2017-06-24 VITALS — BP 124/84 | HR 73 | Temp 98.2°F | Resp 18 | Ht 74.0 in | Wt 240.2 lb

## 2017-06-24 DIAGNOSIS — I10 Essential (primary) hypertension: Secondary | ICD-10-CM

## 2017-06-24 DIAGNOSIS — K219 Gastro-esophageal reflux disease without esophagitis: Secondary | ICD-10-CM | POA: Diagnosis not present

## 2017-06-24 DIAGNOSIS — E782 Mixed hyperlipidemia: Secondary | ICD-10-CM

## 2017-06-24 DIAGNOSIS — J4540 Moderate persistent asthma, uncomplicated: Secondary | ICD-10-CM

## 2017-06-24 DIAGNOSIS — Z0000 Encounter for general adult medical examination without abnormal findings: Secondary | ICD-10-CM | POA: Diagnosis not present

## 2017-06-24 DIAGNOSIS — Z87891 Personal history of nicotine dependence: Secondary | ICD-10-CM

## 2017-06-24 MED ORDER — FLUTICASONE FUROATE-VILANTEROL 200-25 MCG/INH IN AEPB: 1.0000 | INHALATION_SPRAY | Freq: Every day | RESPIRATORY_TRACT | 3 refills | Status: DC

## 2017-06-24 NOTE — Progress Notes (Signed)
Provider: [name], NP  [name], NP as PCP - General (Nurse Practitioner) Blanchie Serve, MD as Attending Physician (Internal Medicine)  Extended Emergency Contact Information Primary Emergency Contact: Ketelsen,[name] Address: [address]          [place], [place] [address] Johnnette Litter of [place] Phone: [phone] Mobile Phone: [phone] Relation: Spouse No Known Allergies Code Status: FULL Goals of Care: Advanced Directive information Advanced Directives 06/24/2017  Does Patient Have a Medical Advance Directive? No  Would patient like information on creating a medical advance directive? -  Pre-existing out of facility DNR order (yellow form or pink MOST form) -     Chief Complaint  Patient presents with  . Medical Management of Chronic Issues    Pt is being seen for a complete physical and follow up on chronic conditions.     HPI: Patient is a 61 y.o. male seen in today for an annual wellness exam.   Had hemorrhoids removed in February due to thrombosed external hemorrhoids   Depression screen [facility] 2/9 06/24/2017 12/20/2015 12/12/2014 08/08/2014 06/07/2013  Decreased Interest 0 0 - 0 0  Down, Depressed, Hopeless 0 0 0 0 0  PHQ - 2 Score 0 0 0 0 0    Fall Risk  06/24/2017 12/24/2016 12/12/2016 09/17/2016 06/19/2016  Falls in the past year? _0    No flowsheet data found.   Health Maintenance  Topic Date Due  . INFLUENZA VACCINE  08/21/2017 (Originally 06/24/2017)  . HIV Screening  06/23/2019 (Originally 09/15/1971)  . Hepatitis C Screening  06/24/2019 (Originally 12-02-1955)  . COLONOSCOPY  10/11/2019  . TETANUS/TDAP  12/07/2023   Was fluticasone propionate and salmeterol inhalation however feels like it is not as effective as the Dulera. Also having a hard time with administration of device and clearing his throat more. Has started using Symbicort 160/4.5 (got from friend)  2 puffs BID but also does not feel like  this is effective   Diet? None Exercise? None  Dentition: every 4 months Pain:none Colonoscopy planned for next year PSA WNL, no changes in urination   Past Medical History:  Diagnosis Date  . Abdominal aortic aneurysm [facility])    sees Dr. Einar Gip for cardiac f/u, [phone]  . Arthritis    lower back  . Asthma    followed by Dr. Berdie Ogren  . Chronic airway obstruction, not elsewhere classified   . Dyspnea    normal PFT April 2012  . External hemorrhoids without mention of complication   . GERD (gastroesophageal reflux disease)   . Gout   . H/O hiatal hernia   . Hemorrhoids   . HTN (hypertension)    hx of sees Dr. Graylin Shiver  . Hyperlipidemia   . Insomnia, unspecified   . Other abnormal blood chemistry   . Other dyspnea and respiratory abnormality   . Other malaise and fatigue   . Other specified erythematous condition(695.89)   . Other testicular dysfunction   . Prostatitis, unspecified   . Rash 2011   left chest, biopsy 2012 Dr. Rozann Lesches, results pending  . Skin cancer   . Tobacco abuse     Past Surgical History:  Procedure Laterality Date  . ABDOMINAL AORTIC ENDOVASCULAR STENT GRAFT  09/22/2012   Procedure: ABDOMINAL AORTIC ENDOVASCULAR STENT GRAFT;  Surgeon: Mal Misty, MD;  Location: [facility];  Service: Vascular;  Laterality: N/A;  GORE; ultrasound guided.  . APPENDECTOMY    . EVALUATION UNDER ANESTHESIA  WITH HEMORRHOIDECTOMY N/A 12/31/2016   Procedure: EXAM UNDER ANESTHESIA WITH HEMORRHOIDECTOMY;  Surgeon: [name], MD;  Location: [facility];  Service: General;  Laterality: N/A;  . SKIN CANCER EXCISION    . TOE SURGERY  01/2012   joint of left great toe  . TONSILLECTOMY    . TONSILLECTOMY    . TYMPANOPLASTY    . WRIST SURGERY     left    Social History   Social History  . Marital status: Married    Spouse name: N/A  . Number of children: N/A  . Years of education: N/A   Occupational History  . CNA [place]   Social  History Main Topics  . Smoking status: Former Smoker    Packs/day: 0.20    Years: 52.50    Types: Cigarettes    Quit date: 09/02/2012  . Smokeless tobacco: Former Systems developer    Types: Snuff  . Alcohol use No     Comment: past ETOH use quit 11-26-2009  . Drug use: No  . Sexual activity: Not Asked   Other Topics Concern  . None   Social History Narrative  . None    Family History  Problem Relation Age of Onset  . Heart attack Father 53  . COPD Father   . Heart disease Father   . Asthma Daughter   . Cancer Mother        Breast  . Lupus Daughter   . Diabetes Other        Grandson     Review of Systems:  Review of Systems  Constitutional: Negative for activity change, appetite change, fatigue, fever and unexpected weight change.  HENT: Negative for congestion, ear pain, hearing loss, rhinorrhea, sore throat, tinnitus, trouble swallowing and voice change.   Eyes:       Corrective lenses  Respiratory: Negative for cough, choking, chest tightness, shortness of breath and wheezing.        Asthma and emphysema. Hx of smoking, quit 2014  Cardiovascular: Negative for chest pain, palpitations and leg swelling.       AAA repaired in 2014, following with vascular yearly  Gastrointestinal: Positive for diarrhea. Negative for abdominal distention, abdominal pain, constipation and nausea.       Hx GERD  Endocrine: Negative for cold intolerance, heat intolerance, polydipsia, polyphagia and polyuria.  Genitourinary: Negative for dysuria, frequency, testicular pain and urgency.       Not incontinent. Hx impotence. Getting up 2 times at night, chronic, no increase in urination  Musculoskeletal: Negative for arthralgias, back pain, gait problem, myalgias and neck pain.       Hx gout  Skin: Negative for color change, pallor and rash.       Following with dermatology yearly for skin surveillance   Allergic/Immunologic: Negative.   Neurological: Negative for dizziness, tremors, syncope, speech  difficulty, weakness, numbness and headaches.  Hematological: Negative for adenopathy. Does not bruise/bleed easily.  Psychiatric/Behavioral: Negative for behavioral problems, confusion, decreased concentration, hallucinations and sleep disturbance. The patient is not nervous/anxious.      Allergies as of 06/24/2017   No Known Allergies     Medication List       Accurate as of 06/24/17  3:14 PM. Always use your most recent med list.          albuterol 108 (90 Base) MCG/ACT inhaler Commonly known as:  PROVENTIL HFA;VENTOLIN HFA Inhale 2 puffs into the lungs every 6 (six) hours as needed. For cough and  wheezing.   aspirin EC 81 MG tablet Take 81 mg by mouth daily.   Fluticasone-Salmeterol 113-14 MCG/ACT Aepb Commonly known as:  AIRDUO RESPICLICK 993/71 Inhale 1 puff into the lungs 2 (two) times daily.   HYDROcodone-acetaminophen 10-325 MG tablet Commonly known as:  NORCO Take 1 tablet by mouth every 4 (four) hours as needed.   losartan 50 MG tablet Commonly known as:  COZAAR TAKE 1 TABLET BY MOUTH ONCE DAILY TO CONTROL BLOOD PRESSURE   pantoprazole 40 MG tablet Commonly known as:  PROTONIX TAKE 1 TABLET (40 MG TOTAL) BY MOUTH DAILY.   sildenafil 50 MG tablet Commonly known as:  VIAGRA Take 1 tablet (50 mg total) by mouth daily as needed for erectile dysfunction.   simvastatin 20 MG tablet Commonly known as:  ZOCOR TAKE 1 TABLET (20 MG TOTAL) BY MOUTH AT BEDTIME.         Physical Exam: Vitals:   06/24/17 1508  BP: 124/84  Pulse: 73  Resp: 18  Temp: 98.2 F (36.8 C)  TempSrc: Oral  SpO2: 96%  Weight: 240 lb 3.2 oz (109 kg)  Height: _0  (1.88 m)   Body mass index is 30.84 kg/m. Physical Exam  Constitutional: He is oriented to person, place, and time. He appears well-developed and well-nourished.  HENT:  Head: Normocephalic and atraumatic.  Right Ear: External ear normal.  Left Ear: External ear normal.  Nose: Nose normal.  Mouth/Throat: Oropharynx  is clear and moist. No oropharyngeal exudate.  Eyes: Pupils are equal, round, and reactive to light. Conjunctivae and EOM are normal. No scleral icterus.  Neck: Neck supple. Carotid bruit is not present. No thyromegaly present.  Cardiovascular: Normal rate, regular rhythm, normal heart sounds and intact distal pulses.  Exam reveals no gallop and no friction rub.   No murmur heard. Pulmonary/Chest: Effort normal and breath sounds normal. He has no wheezes. He has no rales. He exhibits no tenderness.  Abdominal: Soft. Bowel sounds are normal. He exhibits no distension, no abdominal bruit, no pulsatile midline mass and no mass. There is no hepatomegaly. There is no tenderness. There is no rebound and no guarding.  Genitourinary: Prostate normal.  Musculoskeletal: Normal range of motion. He exhibits no edema or tenderness.  Lymphadenopathy:    He has no cervical adenopathy.  Neurological: He is alert and oriented to person, place, and time. He has normal reflexes. No cranial nerve deficit.  Skin: Skin is warm and dry. No rash noted. No erythema.  Psychiatric: He has a normal mood and affect. His behavior is normal. Judgment and thought content normal.    Labs reviewed: Basic Metabolic Panel:  Recent Labs  12/24/16 1039  NA 141  K 5.0  CL 105  CO2 27  GLUCOSE 89  BUN 14  CREATININE 1.02  CALCIUM 9.7  TSH 2.24   Liver Function Tests:  Recent Labs  12/24/16 1039  AST 22  ALT 25  ALKPHOS 93  BILITOT 0.5  PROT 7.4  ALBUMIN 4.2   No results for input(s): LIPASE, AMYLASE in the last 8760 hours. No results for input(s): AMMONIA in the last 8760 hours. CBC:  Recent Labs  12/12/16 1200  WBC 8.7  NEUTROABS 5,742  HGB 15.1  HCT 44.7  MCV 87.8  PLT 320   Lipid Panel:  Recent Labs  12/24/16 1039  CHOL 117  HDL 32*  LDLCALC 62  TRIG 117  CHOLHDL 3.7   Lab Results  Component Value Date   HGBA1C 6.0 (H)  08/09/2015    Procedures: No results  found.  Assessment/Plan 1. Wellness examination The patient was counseled regarding the appropriate use of sunscreen, prevention of dental and periodontal disease, diet, regular sustained exercise for at least 30 minutes 5 times per week, testicular self-examination on a monthly basis, tobacco use,  and recommended schedule for GI hemoccult testing, colonoscopy, cholesterol, thyroid and diabetes screening. Plans to get screening lung CT due to smoking hx. Plans to get colonoscopy next year. PSA WNL on last lab.  2. Moderate persistent asthma without complication Current medication too expensive.  - fluticasone furoate-vilanterol (BREO ELLIPTA) 200-25 MCG/INH AEPB; Inhale 1 puff into the lungs daily.  Dispense: 60 each; Refill: 3 -to clean mouth thoroughly after each use.   3. Essential hypertension Stable, cont losartan  - CBC with Differential/Platelets; Future  4. Mixed hyperlipidemia conts on zocor, LDL at goal in January. To cont medication and lifestyle modifications.  - CMP with eGFR; Future - Lipid Panel; Future  5. Gastroesophageal reflux disease, esophagitis presence not specified Stable on protonix. Cont current regimen with lifestyle modifications.    6. Hx of smoking - CT CHEST LUNG CA SCREEN LOW DOSE W/O CM; Future  Next appt: 6 months for routine follow up.  Carlos American. Harle Battiest  [facility] [phone] 8 am - 5 pm) [phone] (after hours)

## 2017-06-24 NOTE — Patient Instructions (Signed)

## 2017-06-25 ENCOUNTER — Telehealth: Payer: Self-pay

## 2017-06-25 NOTE — Telephone Encounter (Signed)
Perfect we will cont this

## 2017-06-25 NOTE — Telephone Encounter (Signed)
Patient called with gratitude to thank [name], NP   RX prescribed yesterday Adair Patter was only 10 dollars compared to previous rx's that was too expensive  FYI

## 2017-07-24 ENCOUNTER — Ambulatory Visit
Admission: RE | Admit: 2017-07-24 | Discharge: 2017-07-24 | Disposition: A | Discharge status: Home / Self Care | Payer: 59 | Source: Ambulatory Visit | Attending: Nurse Practitioner | Admitting: Nurse Practitioner

## 2017-07-24 DIAGNOSIS — Z87891 Personal history of nicotine dependence: Secondary | ICD-10-CM

## 2017-07-24 IMAGING — CT CT CHEST LUNG CANCER SCREENING LOW DOSE W/O CM
1 of 5 series · 15 of 40 positions shown, 19 images · non-contrast
Comparison: CT chest dated [DATE]

CLINICAL DATA: 60-year-old male former smoker, quit 5 years ago,
with 76 pack-year history of smoking, for initial lung cancer
screening

EXAM:
CT CHEST WITHOUT CONTRAST LOW-DOSE FOR LUNG CANCER SCREENING
TECHNIQUE: Multidetector CT imaging of the chest was performed following the
standard protocol without IV contrast.

[Series 3: lung windows · axial · 0.77mm/px · z∈[-292,-18]mm · 15 of 257 slices shown, 19 images]
[im 25/257  mediastinal]
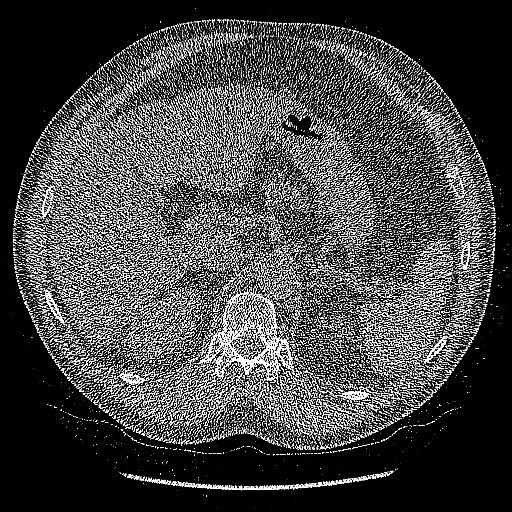
[im 25/257  lung]
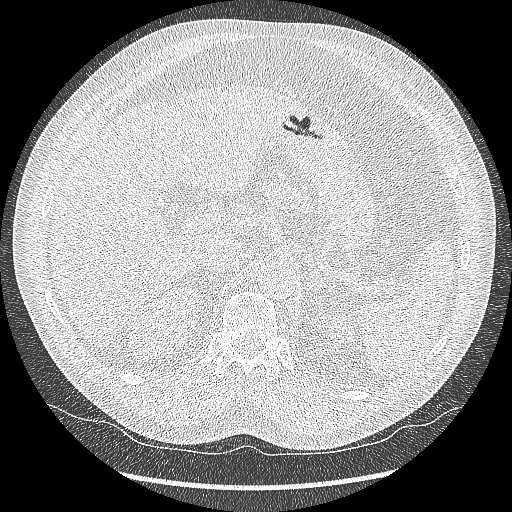
[im 37/257  lung]
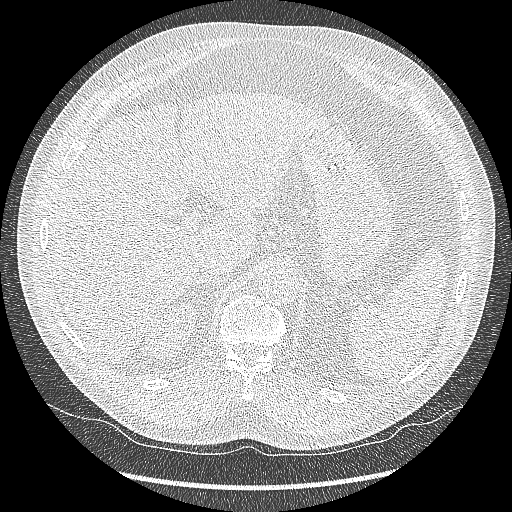
[im 61/257  lung]
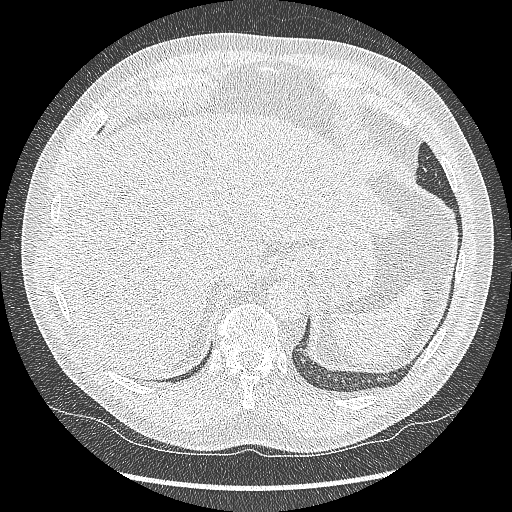
[im 74/257  lung]
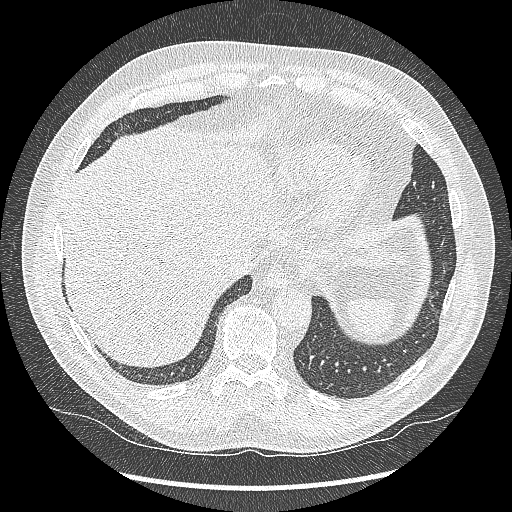
[im 86/257  mediastinal]
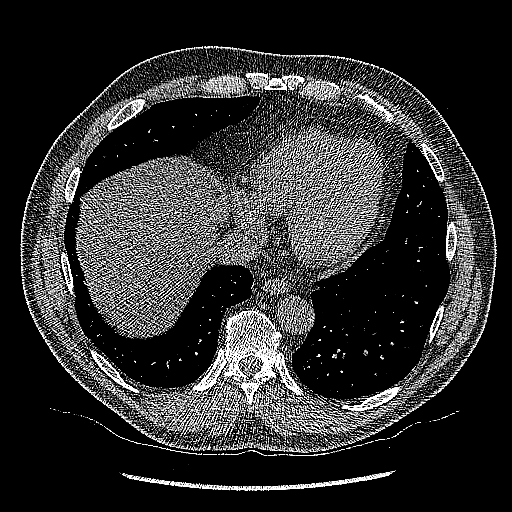
[im 86/257  lung]
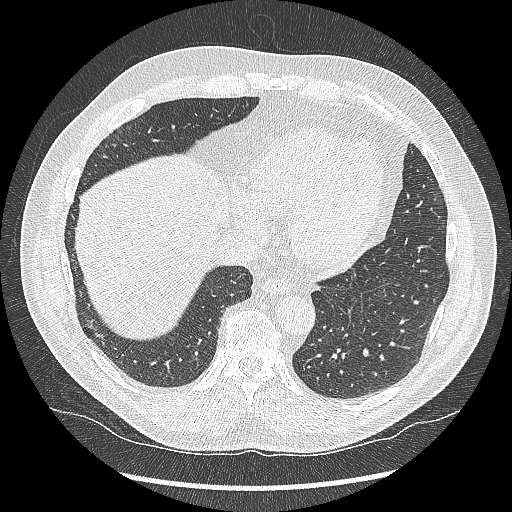
[im 98/257  lung]
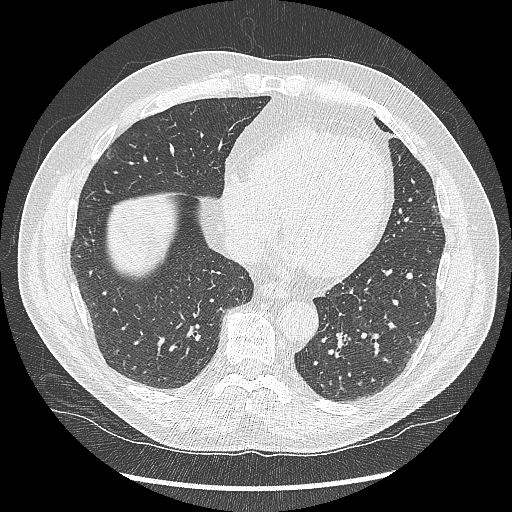
[im 122/257  lung]
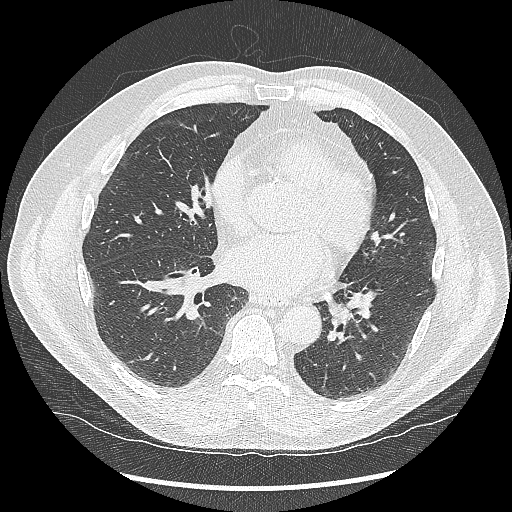
[im 135/257  lung]
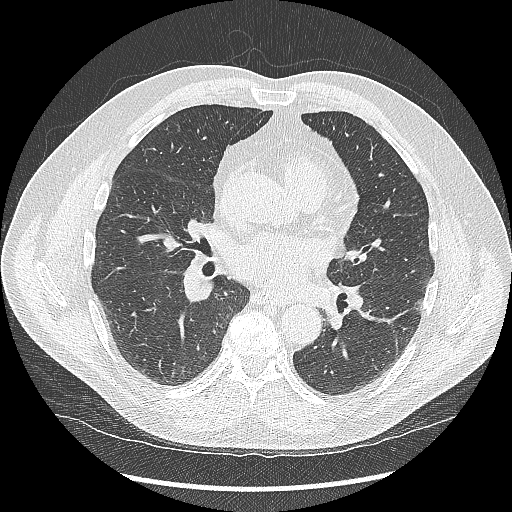
[im 147/257  mediastinal]
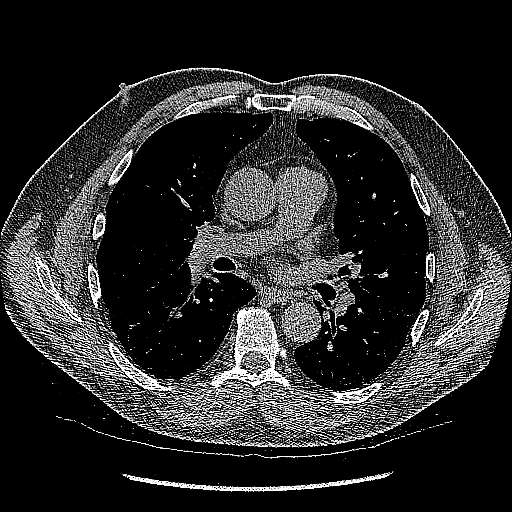
[im 147/257  lung]
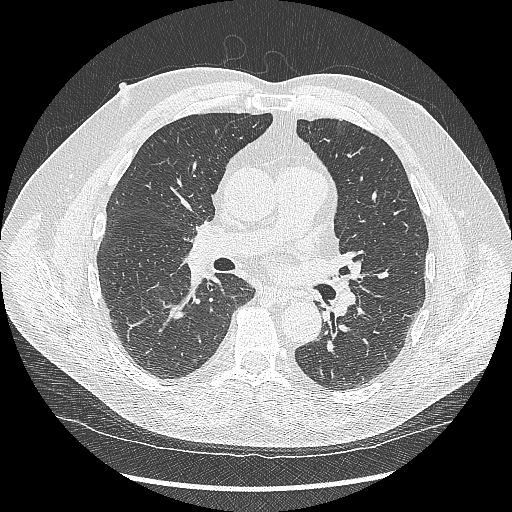
[im 171/257  lung]
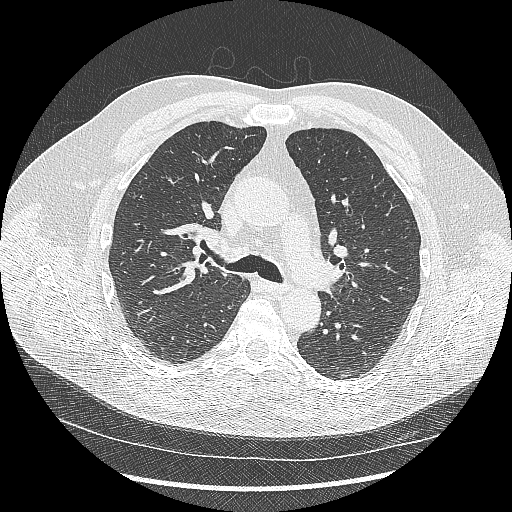
[im 183/257  lung]
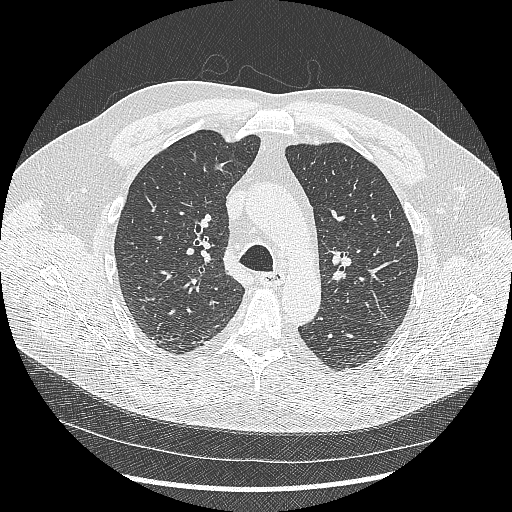
[im 196/257  lung]
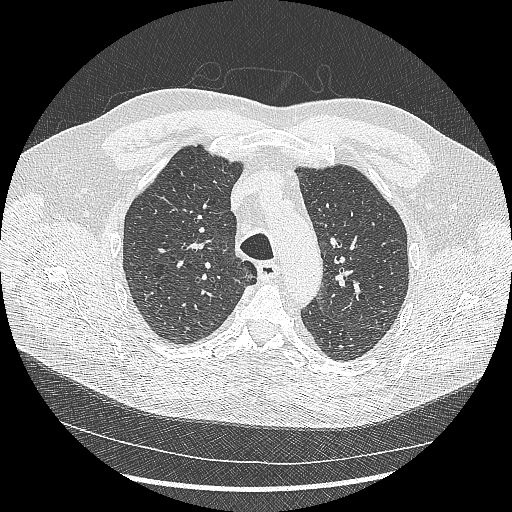
[im 208/257  mediastinal]
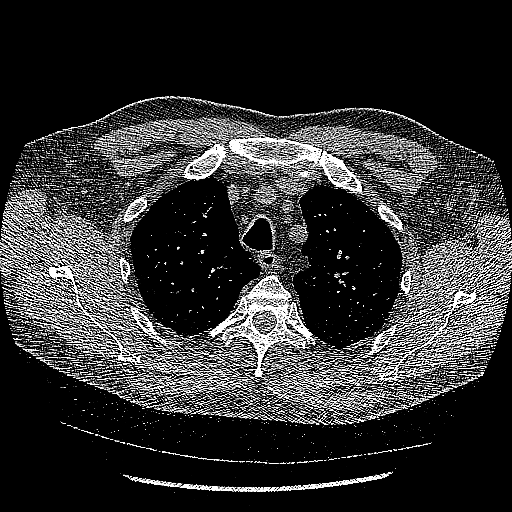
[im 208/257  lung]
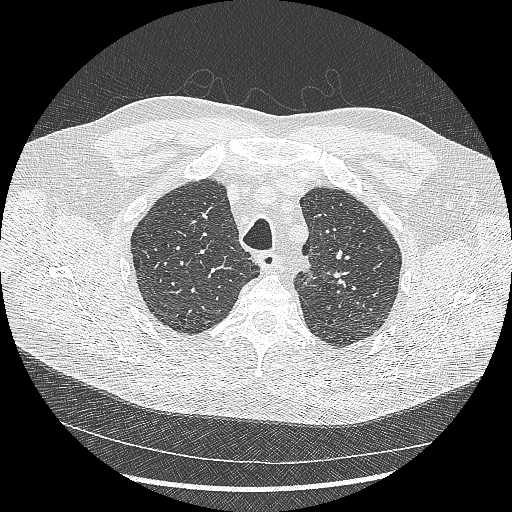
[im 232/257  lung]
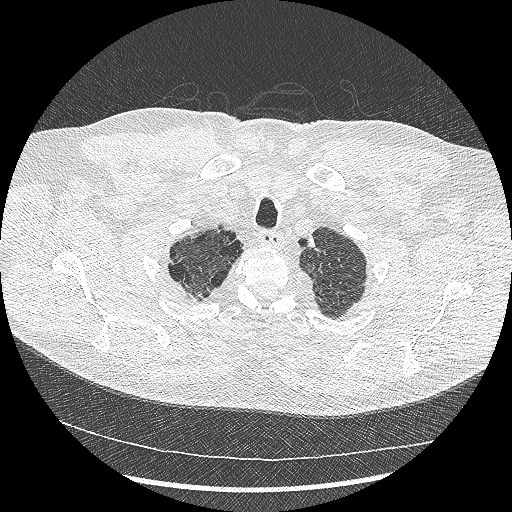
[im 244/257  lung]
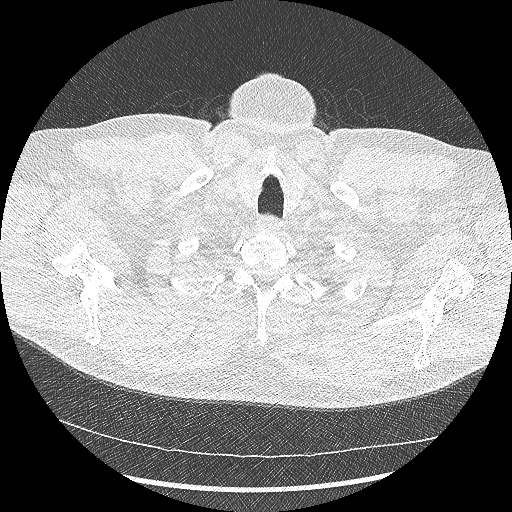

[15 of 40 positions shown; findings below may reference images not displayed]

FINDINGS: Cardiovascular: The heart is normal in size. No pericardial
effusion.

No evidence of thoracic aortic aneurysm. Very mild atherosclerotic
calcifications.

Mild coronary atherosclerosis of the right coronary artery.

Mediastinum/Nodes: No suspicious mediastinal lymphadenopathy.

Visualized thyroid is unremarkable.

Lungs/Pleura: Mild centrilobular and paraseptal emphysematous
changes.

Biapical pleural-parenchymal scarring.

Scattered bilateral pulmonary nodules, some of which are
perifissural (reflecting benign subpleural lymph nodes), a few of
which are subpleural. These measure up to 5.9 mm.

No focal consolidation.

No pleural effusion or pneumothorax.

Upper Abdomen: Visualized upper abdomen is notable for mild hepatic
steatosis.

Musculoskeletal: Visualized osseous structures are within normal
limits.
IMPRESSION: Lung-RADS 2, benign appearance or behavior. Continue annual
screening with low-dose chest CT without contrast in 12 months.

Emphysema ([BM]-[BM]).

## 2017-08-31 ENCOUNTER — Other Ambulatory Visit: Payer: Self-pay | Admitting: Nurse Practitioner

## 2017-08-31 DIAGNOSIS — E785 Hyperlipidemia, unspecified: Secondary | ICD-10-CM

## 2017-09-11 ENCOUNTER — Other Ambulatory Visit: Payer: Self-pay | Admitting: *Deleted

## 2017-09-11 DIAGNOSIS — J4540 Moderate persistent asthma, uncomplicated: Secondary | ICD-10-CM

## 2017-09-11 DIAGNOSIS — E782 Mixed hyperlipidemia: Secondary | ICD-10-CM

## 2017-09-11 MED ORDER — FLUTICASONE FUROATE-VILANTEROL 200-25 MCG/INH IN AEPB: 1.0000 | INHALATION_SPRAY | Freq: Every day | RESPIRATORY_TRACT | 3 refills | Status: DC

## 2017-09-11 MED ORDER — SIMVASTATIN 20 MG PO TABS: 20.0000 mg | ORAL_TABLET | Freq: Every day | ORAL | 3 refills | Status: DC

## 2017-09-11 NOTE — Telephone Encounter (Signed)
Patient Requested. Faxed to pharmacy.  

## 2017-10-14 DIAGNOSIS — Z23 Encounter for immunization: Secondary | ICD-10-CM | POA: Diagnosis not present

## 2017-10-31 ENCOUNTER — Emergency Department (HOSPITAL_COMMUNITY): Payer: 59

## 2017-10-31 ENCOUNTER — Emergency Department (HOSPITAL_COMMUNITY)
Admission: EM | Admit: 2017-10-31 | Discharge: 2017-10-31 | Disposition: A | Discharge status: Home / Self Care | Payer: 59 | Attending: Emergency Medicine | Admitting: Emergency Medicine

## 2017-10-31 ENCOUNTER — Encounter (HOSPITAL_COMMUNITY): Payer: Self-pay | Admitting: Emergency Medicine

## 2017-10-31 DIAGNOSIS — Z87891 Personal history of nicotine dependence: Secondary | ICD-10-CM | POA: Diagnosis not present

## 2017-10-31 DIAGNOSIS — I10 Essential (primary) hypertension: Secondary | ICD-10-CM | POA: Insufficient documentation

## 2017-10-31 DIAGNOSIS — R109 Unspecified abdominal pain: Secondary | ICD-10-CM

## 2017-10-31 DIAGNOSIS — Z85828 Personal history of other malignant neoplasm of skin: Secondary | ICD-10-CM | POA: Insufficient documentation

## 2017-10-31 DIAGNOSIS — Z79899 Other long term (current) drug therapy: Secondary | ICD-10-CM | POA: Insufficient documentation

## 2017-10-31 DIAGNOSIS — J45909 Unspecified asthma, uncomplicated: Secondary | ICD-10-CM | POA: Insufficient documentation

## 2017-10-31 DIAGNOSIS — Z7982 Long term (current) use of aspirin: Secondary | ICD-10-CM | POA: Insufficient documentation

## 2017-10-31 DIAGNOSIS — I714 Abdominal aortic aneurysm, without rupture: Secondary | ICD-10-CM | POA: Diagnosis not present

## 2017-10-31 DIAGNOSIS — R1031 Right lower quadrant pain: Secondary | ICD-10-CM | POA: Insufficient documentation

## 2017-10-31 LAB — CBC
HCT: 48.9 % (ref 39.0–52.0)
Hemoglobin: 16 g/dL (ref 13.0–17.0)
MCH: 30.6 pg (ref 26.0–34.0)
MCHC: 32.7 g/dL (ref 30.0–36.0)
MCV: 93.5 fL (ref 78.0–100.0)
Platelets: 282 10*3/uL (ref 150–400)
RBC: 5.23 MIL/uL (ref 4.22–5.81)
RDW: 13.1 % (ref 11.5–15.5)
WBC: 12 10*3/uL — ABNORMAL HIGH (ref 4.0–10.5)

## 2017-10-31 LAB — URINALYSIS, ROUTINE W REFLEX MICROSCOPIC
Bilirubin Urine: NEGATIVE
Glucose, UA: NEGATIVE mg/dL
Hgb urine dipstick: NEGATIVE
Ketones, ur: NEGATIVE mg/dL
Leukocytes, UA: NEGATIVE
Nitrite: NEGATIVE
Protein, ur: NEGATIVE mg/dL
Specific Gravity, Urine: 1.026 (ref 1.005–1.030)
pH: 5 (ref 5.0–8.0)

## 2017-10-31 LAB — COMPREHENSIVE METABOLIC PANEL
ALT: 32 U/L (ref 17–63)
AST: 29 U/L (ref 15–41)
Albumin: 4.4 g/dL (ref 3.5–5.0)
Alkaline Phosphatase: 100 U/L (ref 38–126)
Anion gap: 7 (ref 5–15)
BUN: 14 mg/dL (ref 6–20)
CO2: 28 mmol/L (ref 22–32)
Calcium: 9.4 mg/dL (ref 8.9–10.3)
Chloride: 108 mmol/L (ref 101–111)
Creatinine, Ser: 1.15 mg/dL (ref 0.61–1.24)
GFR calc Af Amer: >60 mL/min (ref >60)
GFR calc non Af Amer: >60 mL/min (ref >60)
Glucose, Bld: 111 mg/dL — ABNORMAL HIGH (ref 65–99)
Potassium: 3.6 mmol/L (ref 3.5–5.1)
Sodium: 143 mmol/L (ref 135–145)
Total Bilirubin: 0.5 mg/dL (ref 0.3–1.2)
Total Protein: 7.7 g/dL (ref 6.5–8.1)

## 2017-10-31 LAB — LIPASE, BLOOD: Lipase: 23 U/L (ref 11–51)

## 2017-10-31 IMAGING — CT CT CTA ABD/PEL W/CM AND/OR W/O CM
2 of 6 series · 15 of 46 positions shown, 18 images · IV contrast (Isovue)
Comparison: CT angiogram of the abdomen and pelvis [DATE]

CLINICAL DATA: Abdominal pain and swelling. History of abdominal
and aortic aneurysm repair.

EXAM:
CTA ABDOMEN AND PELVIS WITH CONTRAST
TECHNIQUE: Multidetector CT imaging of the abdomen and pelvis was performed
using the standard protocol during bolus administration of
intravenous contrast. Multiplanar reconstructed images and MIPs were
obtained and reviewed to evaluate the vascular anatomy.
CONTRAST:  100mL [SK] IOPAMIDOL ([SK]) INJECTION 76%

[Series 4: aneurysm axial arterial · axial · arterial · 0.83mm/px · z∈[-192,+255]mm · 12 of 169 slices shown, 15 images]
[im 13/169  soft-tissue]
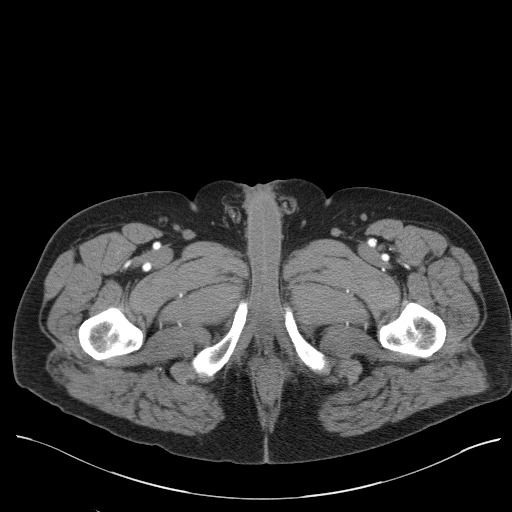
[im 13/169  bone]
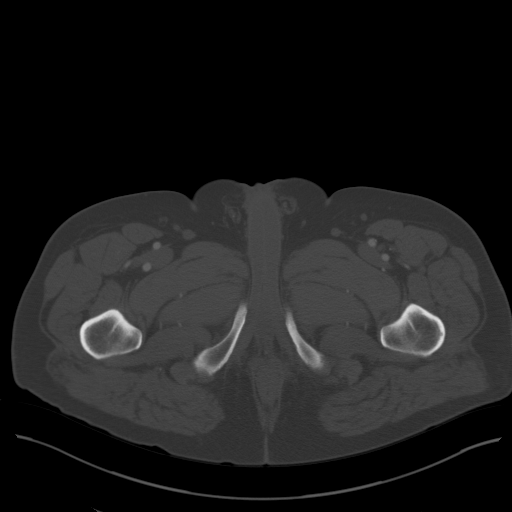
[im 32/169  soft-tissue]
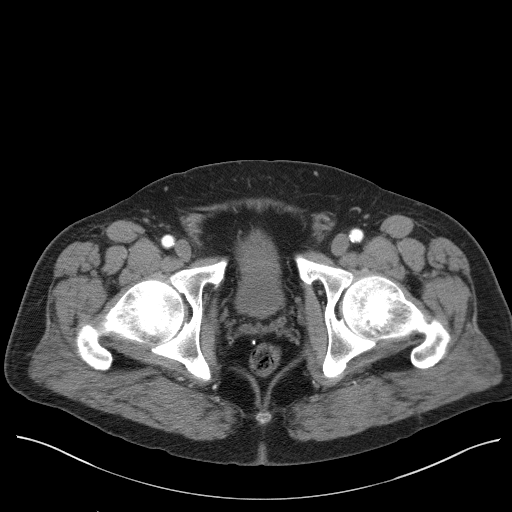
[im 50/169  soft-tissue]
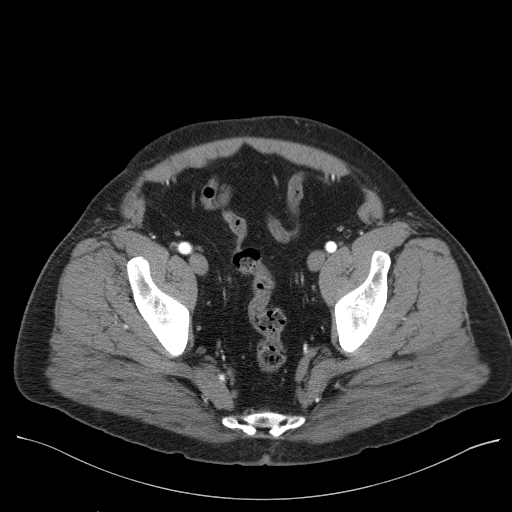
[im 69/169  soft-tissue]
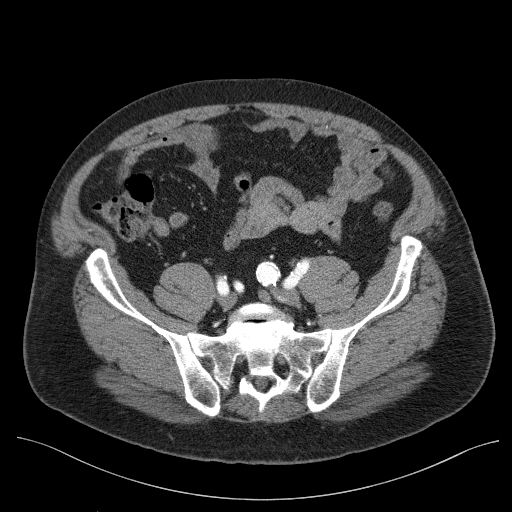
[im 88/169  soft-tissue]
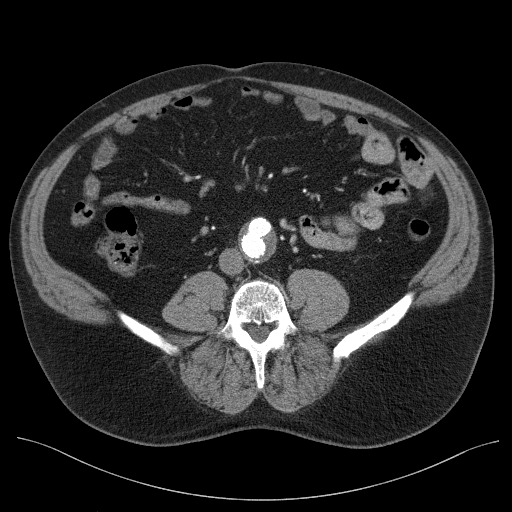
[im 100/169  soft-tissue]
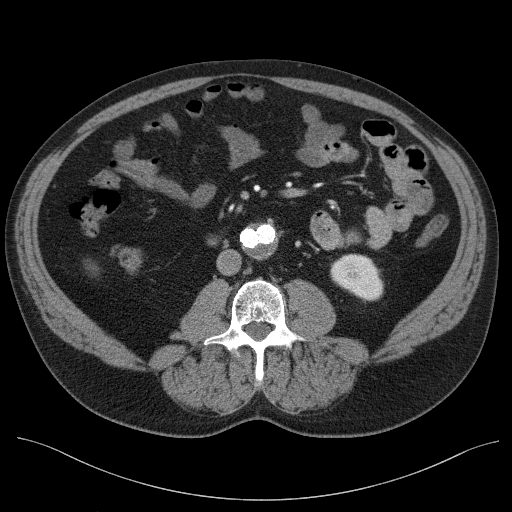
[im 119/169  soft-tissue]
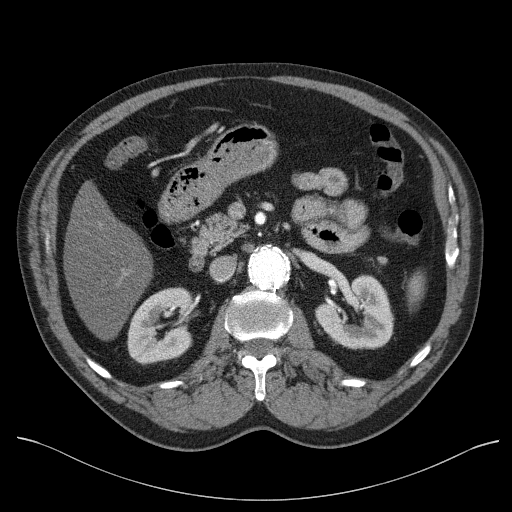
[im 137/169  soft-tissue]
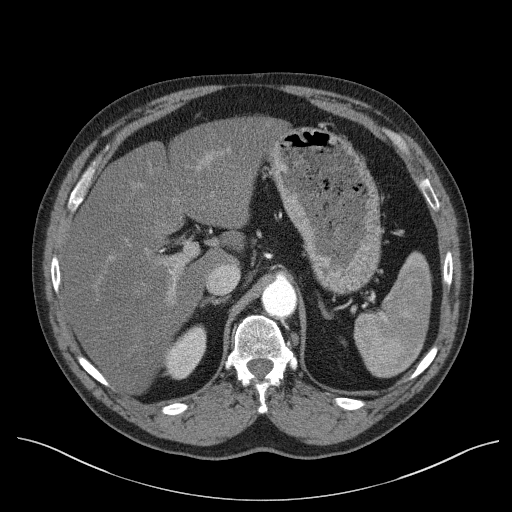
[im 144/169  lung]
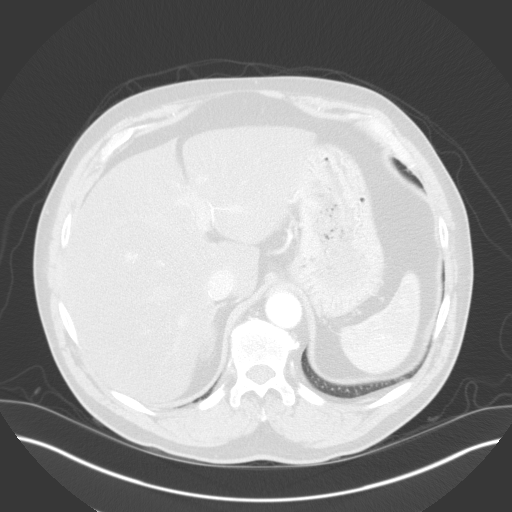
[im 150/169  lung]
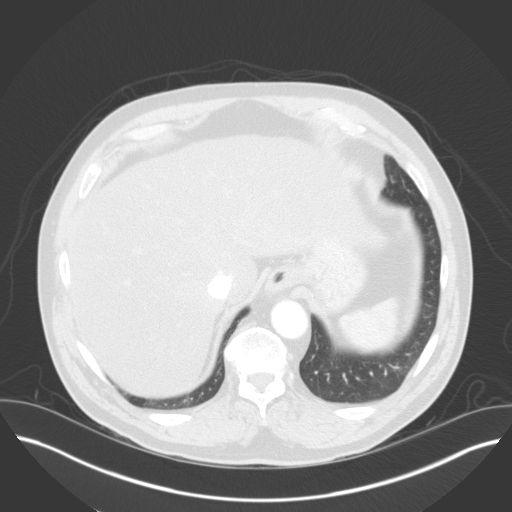
[im 156/169  soft-tissue]
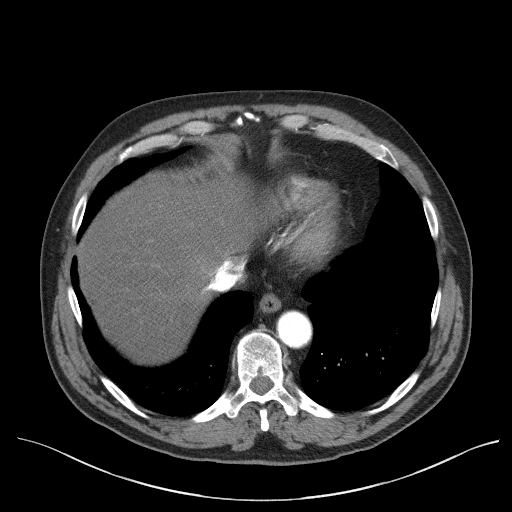
[im 156/169  lung]
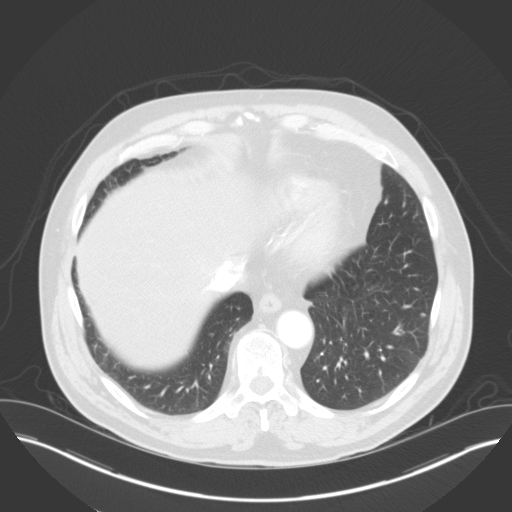
[im 156/169  bone]
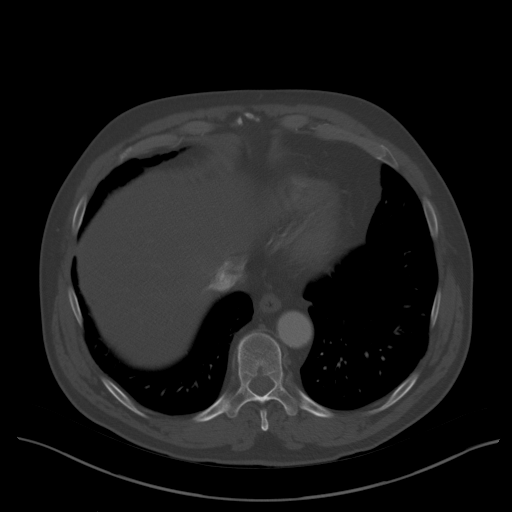
[im 162/169  lung]
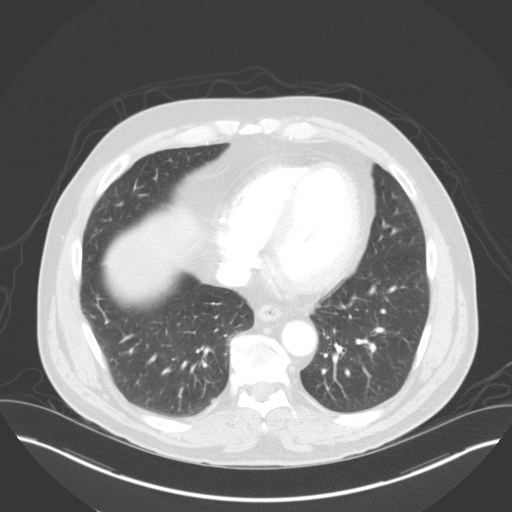

[Series 7: coronal · coronal · 1.03mm/px · 3 of 112 slices shown]
[im 28/112  soft-tissue]
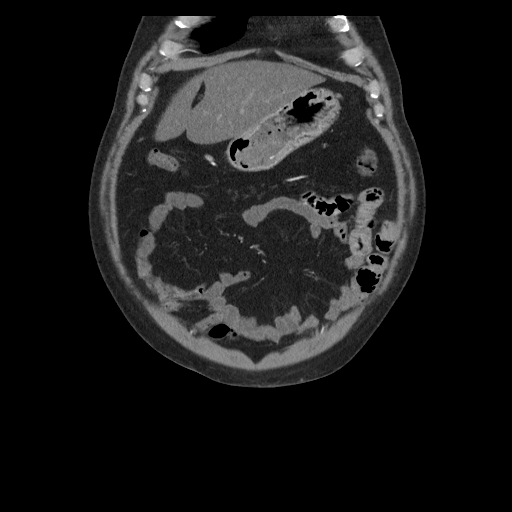
[im 56/112  soft-tissue]
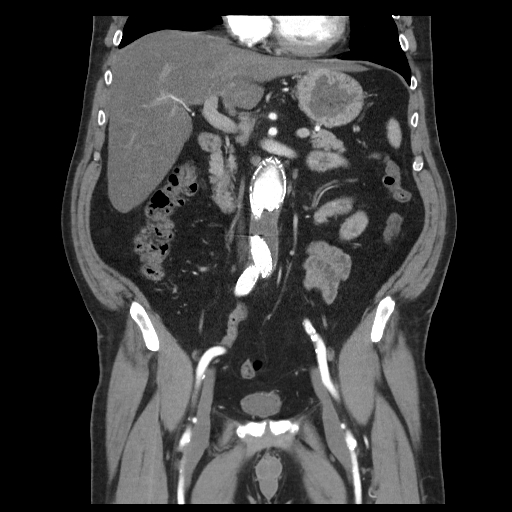
[im 84/112  soft-tissue]
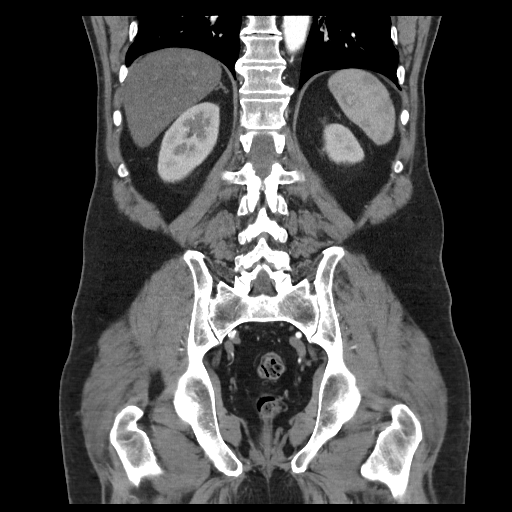

[15 of 46 positions shown; findings below may reference images not displayed]

FINDINGS: VASCULAR

Aorta: 3.9 cm infrarenal aortic aneurysm at proximal stent,
previously 3.7 cm. Status post endovascular stent graft repair. The
aneurysm sac about the bifurcation is 3.3 x 3.8 cm, previously 3.1 x
4 cm. No endoleak. Patent graft.

Celiac: Celiac trunk and main vessels are patent.

SMA: Patent, trace mid vessel eccentric atherosclerosis.

Renals: Minimal calcification RIGHT renal ostia, bilateral renal
artery's origins widely patent.

IMA: Chronically occluded origin with proximal reconstitution.

Inflow: Patent, mild calcific atherosclerosis.

Proximal Outflow: Patent, mild calcific atherosclerosis.

Veins: Limited assessment due to contrast bolus timing. Patent main
portal vein.

Review of the MIP images confirms the above findings.

NON-VASCULAR

LUNG BASES: Stable small sub solid pulmonary nodules including 4 mm
LEFT lower lobe nodule. Heart size is upper limits of normal. No
pericardial effusion.

HEPATOBILIARY: Low-density liver compatible with steatosis, mild
focal fatty sparing about the gallbladder fossa. Compressed
gallbladder, otherwise unremarkable.

PANCREAS: Normal.

SPLEEN: Normal.

ADRENALS/URINARY TRACT: Kidneys are orthotopic, demonstrating
symmetric enhancement. No nephrolithiasis, hydronephrosis or solid
renal masses. The unopacified ureters are normal in course and
caliber. Urinary bladder is partially distended, mild intramural fat
seen with chronic inflammation. Normal adrenal glands.

STOMACH/BOWEL: The stomach, small and large bowel are normal in
course and caliber without inflammatory changes, sensitivity
decreased without oral contrast. Mild colonic diverticulosis. Mild
amount of retained large bowel stool. Status post appendectomy.

VASCULAR/LYMPHATIC: No lymphadenopathy by CT size criteria.

REPRODUCTIVE: Normal.

OTHER: No intraperitoneal free fluid or free air.

MUSCULOSKELETAL: Nonacute. Moderate to severe L4-5 and L5-S1
degenerative discs.

Review of the MIP images confirms the above findings.
IMPRESSION: VASCULAR

1. Slight interval growth of 3.9 cm infrarenal aortic aneurysm from
3.7 cm, status post endovascular stent graft repair. Similar sac
dimension at bifurcation without endoleak.
2. Mild atherosclerosis without stenosis.

NON-VASCULAR

1. No acute intra-abdominal or pelvic process.
2. Hepatic steatosis.

## 2017-10-31 MED ORDER — IOPAMIDOL (ISOVUE-370) INJECTION 76%
100.0000 mL | Freq: Once | INTRAVENOUS | Status: AC | PRN
  Administered 2017-10-31: 100 mL via INTRAVENOUS

## 2017-10-31 NOTE — ED Notes (Signed)
Pt alert & oriented x4, stable gait. Patient  given discharge instructions, paperwork & prescription(s). Patient verbalized understanding. Pt left department w/ no further questions. 

## 2017-10-31 NOTE — ED Triage Notes (Signed)
Pt C/O his abdomen swelling. Pt has taken 2 stool softeners and 2 gas-X with no relief.

## 2017-11-01 NOTE — ED Provider Notes (Signed)
[facility] EMERGENCY DEPARTMENT Provider Note   CSN: [id number] Arrival date & time: 10/31/17  2004     History   Chief Complaint Chief Complaint  Patient presents with  . Abdominal distention    HPI Aaron [name] is a 61 y.o. male.  HPI   61yM with abdominal pain and bloating. Pain RLQ. S/p appendectomy as child. Pain is more and annoyance. Belly has been really distended despite several BMs. No n/v. No urinary complaints. S/p AAA repair years ago. No back pain. No dizziness/lighteheadedness.   Past Medical History:  Diagnosis Date  . Abdominal aortic aneurysm [facility])    sees Dr. Einar Gip for cardiac f/u, [phone]  . Arthritis    lower back  . Asthma    followed by Dr. Berdie Ogren  . Chronic airway obstruction, not elsewhere classified   . Dyspnea    normal PFT April 2012  . External hemorrhoids without mention of complication   . GERD (gastroesophageal reflux disease)   . Gout   . H/O hiatal hernia   . Hemorrhoids   . HTN (hypertension)    hx of sees Dr. Graylin Shiver  . Hyperlipidemia   . Insomnia, unspecified   . Other abnormal blood chemistry   . Other dyspnea and respiratory abnormality   . Other malaise and fatigue   . Other specified erythematous condition(695.89)   . Other testicular dysfunction   . Prostatitis, unspecified   . Rash 2011   left chest, biopsy 2012 Dr. Rozann Lesches, results pending  . Skin cancer   . Tobacco abuse     Patient Active Problem List   Diagnosis Date Noted  . Thrombosed external hemorrhoid 12/24/2016  . Uncomplicated asthma 34/19/3790  . High risk medication use 12/24/2016  . Prostate cancer screening 12/24/2016  . Diarrhea 09/17/2016  . Other emphysema ([place]) 04/15/2015  . Erectile dysfunction 12/12/2014  . Fatty liver 04/04/2014  . Pulmonary nodules 04/04/2014  . Chronic back pain 12/06/2013  . Gout 12/06/2013  . Routine general medical examination at a health care facility 12/06/2013  . GERD (gastroesophageal reflux  disease) 12/06/2013  . Tongue ulcer 12/06/2013  . AAA (abdominal aortic aneurysm) without rupture ([name]) 11/01/2013  . Depression 06/29/2013  . Musculoskeletal pain 06/07/2013  . Lung nodule, multiple 05/26/2013  . Chronic cough 03/21/2013  . Gouty arthropathy 02/23/2013  . Prediabetes 02/23/2013  . Need for prophylactic vaccination and inoculation against influenza 09/21/2012  . Pre-operative respiratory examination 09/21/2012  . Asthma, moderate persistent 03/05/2011  . HYPERLIPIDEMIA 01/20/2011  . Hx of smoking 01/20/2011  . Essential hypertension 01/20/2011    Past Surgical History:  Procedure Laterality Date  . ABDOMINAL AORTIC ENDOVASCULAR STENT GRAFT  09/22/2012   Procedure: ABDOMINAL AORTIC ENDOVASCULAR STENT GRAFT;  Surgeon: Mal Misty, MD;  Location: [place];  Service: Vascular;  Laterality: N/A;  GORE; ultrasound guided.  . APPENDECTOMY    . EVALUATION UNDER ANESTHESIA WITH HEMORRHOIDECTOMY N/A 12/31/2016   Procedure: EXAM UNDER ANESTHESIA WITH HEMORRHOIDECTOMY;  Surgeon: [name], MD;  Location: [place];  Service: General;  Laterality: N/A;  . SKIN CANCER EXCISION    . TOE SURGERY  01/2012   joint of left great toe  . TONSILLECTOMY    . TONSILLECTOMY    . TYMPANOPLASTY    . WRIST SURGERY     left       Home Medications    Prior to Admission medications   Medication Sig Start Date End Date Taking? Authorizing Provider  albuterol (PROVENTIL HFA;VENTOLIN  HFA) 108 (90 BASE) MCG/ACT inhaler Inhale 2 puffs into the lungs every 6 (six) hours as needed. For cough and wheezing. 02/21/15  Yes [name], NP  aspirin EC 81 MG tablet Take 81 mg by mouth daily.   Yes [provider]  docusate sodium (COLACE) 100 MG capsule Take 100 mg by mouth once as needed for mild constipation.   Yes [provider]  fluticasone furoate-vilanterol (BREO ELLIPTA) 200-25 MCG/INH AEPB Inhale 1 puff into the lungs daily. 09/11/17  Yes  [name], NP  losartan (COZAAR) 50 MG tablet TAKE 1 TABLET BY MOUTH ONCE DAILY TO CONTROL BLOOD PRESSURE 02/23/17  Yes [name], NP  pantoprazole (PROTONIX) 40 MG tablet TAKE 1 TABLET (40 MG TOTAL) BY MOUTH DAILY. 05/29/17  Yes [name], NP  simethicone (GAS-X) 80 MG chewable tablet Chew 80 mg by mouth once as needed for flatulence.   Yes [provider]  simvastatin (ZOCOR) 20 MG tablet Take 1 tablet (20 mg total) by mouth at bedtime. 09/11/17  Yes [name], NP  sildenafil (VIAGRA) 50 MG tablet Take 1 tablet (50 mg total) by mouth daily as needed for erectile dysfunction. 06/19/16   [name], NP    Family History Family History  Problem Relation Age of Onset  . Heart attack Father 4  . COPD Father   . Heart disease Father   . Asthma Daughter   . Cancer Mother        Breast  . Lupus Daughter   . Diabetes Other        Grandson     Social History Social History   Tobacco Use  . Smoking status: Former Smoker    Packs/day: 0.20    Years: 52.50    Pack years: 10.50    Types: Cigarettes    Last attempt to quit: 09/02/2012    Years since quitting: 5.1  . Smokeless tobacco: Former Systems developer    Types: Snuff  Substance Use Topics  . Alcohol use: No    Alcohol/week: 0.0 oz    Comment: past ETOH use quit 11-26-2009  . Drug use: No     Allergies   Patient has no known allergies.   Review of Systems Review of Systems  All systems reviewed and negative, other than as noted in HPI.  Physical Exam Updated Vital Signs BP (!) 127/96 (BP Location: Left Arm)   Pulse 83   Temp 98.2 F (36.8 C) (Oral)   Resp 18   SpO2 94%   Physical Exam  Constitutional: He appears well-developed and well-nourished. No distress.  HENT:  Head: Normocephalic and atraumatic.  Eyes: Conjunctivae are normal. Right eye exhibits no discharge. Left eye exhibits no discharge.  Neck: Neck supple.  Cardiovascular: Normal rate, regular rhythm and normal  heart sounds. Exam reveals no gallop and no friction rub.  No murmur heard. Pulmonary/Chest: Effort normal and breath sounds normal. No respiratory distress.  Abdominal: Soft. He exhibits distension. There is tenderness.  Mild TTP RLQ   Musculoskeletal: He exhibits no edema or tenderness.  Neurological: He is alert.  Skin: Skin is warm and dry.  Psychiatric: He has a normal mood and affect. His behavior is normal. Thought content normal.  Nursing note and vitals reviewed.    ED Treatments / Results  Labs (all labs ordered are listed, but only abnormal results are displayed) Labs Reviewed  COMPREHENSIVE METABOLIC PANEL - Abnormal; Notable for the following components:  Result Value   Glucose, Bld 111 (*)    All other components within normal limits  CBC - Abnormal; Notable for the following components:   WBC 12.0 (*)    All other components within normal limits  LIPASE, BLOOD  URINALYSIS, ROUTINE W REFLEX MICROSCOPIC    EKG  EKG Interpretation None       Radiology Ct Angio Abd/pel W And/or Wo Contrast  Result Date: 10/31/2017 CLINICAL DATA:  Abdominal pain and swelling. History of abdominal and aortic aneurysm repair. EXAM: CTA ABDOMEN AND PELVIS WITH CONTRAST TECHNIQUE: Multidetector CT imaging of the abdomen and pelvis was performed using the standard protocol during bolus administration of intravenous contrast. Multiplanar reconstructed images and MIPs were obtained and reviewed to evaluate the vascular anatomy. CONTRAST:  1103mL ISOVUE-370 IOPAMIDOL (ISOVUE-370) INJECTION 76% COMPARISON:  CT angiogram of the abdomen and pelvis November 06, 2015 FINDINGS: VASCULAR Aorta: 3.9 cm infrarenal aortic aneurysm at proximal stent, previously 3.7 cm. Status post endovascular stent graft repair. The aneurysm sac about the bifurcation is 3.3 x 3.8 cm, previously 3.1 x 4 cm. No endoleak. Patent graft. Celiac: Celiac trunk and main vessels are patent. SMA: Patent, trace mid vessel  eccentric atherosclerosis. Renals: Minimal calcification RIGHT renal ostia, bilateral renal artery's origins widely patent. IMA: Chronically occluded origin with proximal reconstitution. Inflow: Patent, mild calcific atherosclerosis. Proximal Outflow: Patent, mild calcific atherosclerosis. Veins: Limited assessment due to contrast bolus timing. Patent main portal vein. Review of the MIP images confirms the above findings. NON-VASCULAR LUNG BASES: Stable small sub solid pulmonary nodules including 4 mm LEFT lower lobe nodule. Heart size is upper limits of normal. No pericardial effusion. HEPATOBILIARY: Low-density liver compatible with steatosis, mild focal fatty sparing about the gallbladder fossa. Compressed gallbladder, otherwise unremarkable. PANCREAS: Normal. SPLEEN: Normal. ADRENALS/URINARY TRACT: Kidneys are orthotopic, demonstrating symmetric enhancement. No nephrolithiasis, hydronephrosis or solid renal masses. The unopacified ureters are normal in course and caliber. Urinary bladder is partially distended, mild intramural fat seen with chronic inflammation. Normal adrenal glands. STOMACH/BOWEL: The stomach, small and large bowel are normal in course and caliber without inflammatory changes, sensitivity decreased without oral contrast. Mild colonic diverticulosis. Mild amount of retained large bowel stool. Status post appendectomy. VASCULAR/LYMPHATIC: No lymphadenopathy by CT size criteria. REPRODUCTIVE: Normal. OTHER: No intraperitoneal free fluid or free air. MUSCULOSKELETAL: Nonacute. Moderate to severe L4-5 and L5-S1 degenerative discs. Review of the MIP images confirms the above findings. IMPRESSION: VASCULAR 1. Slight interval growth of 3.9 cm infrarenal aortic aneurysm from 3.7 cm, status post endovascular stent graft repair. Similar sac dimension at bifurcation without endoleak. 2. Mild atherosclerosis without stenosis. NON-VASCULAR 1. No acute intra-abdominal or pelvic process. 2. Hepatic steatosis.  Electronically Signed   By: Elon Alas M.D.   On: 10/31/2017 22:36    Procedures Procedures (including critical care time)  Medications Ordered in ED Medications  iopamidol (ISOVUE-370) 76 % injection 100 mL (100 mLs Intravenous Contrast Given 10/31/17 2152)     Initial Impression / Assessment and Plan / ED Course  I have reviewed the triage vital signs and the nursing notes.  Pertinent labs & imaging results that were available during my care of the patient were reviewed by me and considered in my medical decision making (see chart for details).     61yM with abdominal pain. Not sure of etiology. I doubt emergent process. AAA appears fairly stable. Advised to let his vascular surgeon be aware of recent imaging.   It has been determined that no acute conditions  requiring further emergency intervention are present at this time. The patient has been advised of the diagnosis and plan. I reviewed any labs and imaging including any potential incidental findings. We have discussed signs and symptoms that warrant return to the ED and they are listed in the discharge instructions.    Final Clinical Impressions(s) / ED Diagnoses   Final diagnoses:  Abdominal pain, unspecified abdominal location    ED Discharge Orders    None       Virgel Manifold, MD 11/01/17 [phone]

## 2017-11-12 ENCOUNTER — Ambulatory Visit (HOSPITAL_COMMUNITY)
Admission: RE | Admit: 2017-11-12 | Discharge: 2017-11-12 | Disposition: A | Discharge status: Home / Self Care | Payer: 59 | Source: Ambulatory Visit | Attending: Family | Admitting: Family

## 2017-11-12 ENCOUNTER — Encounter: Payer: Self-pay | Admitting: Family

## 2017-11-12 ENCOUNTER — Ambulatory Visit (INDEPENDENT_AMBULATORY_CARE_PROVIDER_SITE_OTHER): Payer: 59 | Admitting: Family

## 2017-11-12 VITALS — BP 124/82 | HR 64 | Temp 97.1°F | Resp 18 | Wt 237.9 lb

## 2017-11-12 DIAGNOSIS — I714 Abdominal aortic aneurysm, without rupture, unspecified: Secondary | ICD-10-CM

## 2017-11-12 DIAGNOSIS — Z87891 Personal history of nicotine dependence: Secondary | ICD-10-CM

## 2017-11-12 DIAGNOSIS — Z48812 Encounter for surgical aftercare following surgery on the circulatory system: Secondary | ICD-10-CM | POA: Diagnosis not present

## 2017-11-12 DIAGNOSIS — Z95828 Presence of other vascular implants and grafts: Secondary | ICD-10-CM | POA: Insufficient documentation

## 2017-11-12 NOTE — Progress Notes (Signed)
[facility] [place]  CC: Follow up s/p Endovascular Repair of Abdominal Aortic Aneurysm    History of Present Illness  Aaron [name] is a 61 y.o. (04/19/1956) male whom Dr. Kellie Simmering has been monitoring post endovascular stent graft repair of abdominal aortic aneurysm on 09-22-12. He has done very well. He has no abdominal or back symptoms that he relates. He quit smoking in 2013 and has not resumed. He denies any claudication symptoms. He takes one aspirin per day. He denies any hx of stroke or TIA.  Pt Diabetic: No Pt smoker: former smoker, quit in 2013, started about age 31 years    Past Medical History:  Diagnosis Date  . Abdominal aortic aneurysm [facility])    sees Dr. Einar Gip for cardiac f/u, [phone]  . Arthritis    lower back  . Asthma    followed by Dr. Berdie Ogren  . Chronic airway obstruction, not elsewhere classified   . Dyspnea    normal PFT April 2012  . External hemorrhoids without mention of complication   . GERD (gastroesophageal reflux disease)   . Gout   . H/O hiatal hernia   . Hemorrhoids   . HTN (hypertension)    hx of sees Dr. Graylin Shiver  . Hyperlipidemia   . Insomnia, unspecified   . Other abnormal blood chemistry   . Other dyspnea and respiratory abnormality   . Other malaise and fatigue   . Other specified erythematous condition(695.89)   . Other testicular dysfunction   . Prostatitis, unspecified   . Rash 2011   left chest, biopsy 2012 Dr. Rozann Lesches, results pending  . Skin cancer   . Tobacco abuse    Past Surgical History:  Procedure Laterality Date  . ABDOMINAL AORTIC ENDOVASCULAR STENT GRAFT  09/22/2012   Procedure: ABDOMINAL AORTIC ENDOVASCULAR STENT GRAFT;  Surgeon: Mal Misty, MD;  Location: [place];  Service: Vascular;  Laterality: N/A;  GORE; ultrasound guided.  . APPENDECTOMY    . EVALUATION UNDER ANESTHESIA WITH HEMORRHOIDECTOMY N/A 12/31/2016   Procedure: EXAM UNDER ANESTHESIA WITH HEMORRHOIDECTOMY;  Surgeon:  [name], MD;  Location: [place];  Service: General;  Laterality: N/A;  . SKIN CANCER EXCISION    . TOE SURGERY  01/2012   joint of left great toe  . TONSILLECTOMY    . TONSILLECTOMY    . TYMPANOPLASTY    . WRIST SURGERY     left   Social History Social History   Tobacco Use  . Smoking status: Former Smoker    Packs/day: 0.20    Years: 52.50    Pack years: 10.50    Types: Cigarettes    Last attempt to quit: 09/02/2012    Years since quitting: 5.1  . Smokeless tobacco: Former Systems developer    Types: Snuff  Substance Use Topics  . Alcohol use: No    Alcohol/week: 0.0 oz    Comment: past ETOH use quit 11-26-2009  . Drug use: No   Family History Family History  Problem Relation Age of Onset  . Heart attack Father 87  . COPD Father   . Heart disease Father   . Asthma Daughter   . Cancer Mother        Breast  . Lupus Daughter   . Diabetes Other        Grandson    Current Outpatient Medications on File Prior to Visit  Medication Sig Dispense Refill  . albuterol (PROVENTIL HFA;VENTOLIN HFA) 108 (90 BASE) MCG/ACT inhaler Inhale 2  puffs into the lungs every 6 (six) hours as needed. For cough and wheezing. 24 g 0  . aspirin EC 81 MG tablet Take 81 mg by mouth daily.    Marland Kitchen docusate sodium (COLACE) 100 MG capsule Take 100 mg by mouth once as needed for mild constipation.    . fluticasone furoate-vilanterol (BREO ELLIPTA) 200-25 MCG/INH AEPB Inhale 1 puff into the lungs daily. 60 each 3  . losartan (COZAAR) 50 MG tablet TAKE 1 TABLET BY MOUTH ONCE DAILY TO CONTROL BLOOD PRESSURE 90 tablet 1  . pantoprazole (PROTONIX) 40 MG tablet TAKE 1 TABLET (40 MG TOTAL) BY MOUTH DAILY. 90 tablet 1  . sildenafil (VIAGRA) 50 MG tablet Take 1 tablet (50 mg total) by mouth daily as needed for erectile dysfunction. 10 tablet 2  . simethicone (GAS-X) 80 MG chewable tablet Chew 80 mg by mouth once as needed for flatulence.    . simvastatin (ZOCOR) 20 MG tablet Take 1 tablet (20 mg  total) by mouth at bedtime. 90 tablet 3   No current facility-administered medications on file prior to visit.    No Known Allergies   ROS: See HPI for pertinent positives and negatives.  Physical Examination  Vitals:   11/12/17 0841 11/12/17 0843  BP: 126/80 124/82  Pulse: 64   Resp: 18   Temp: (!) 97.1 F (36.2 C)   TempSrc: Oral   SpO2: 96%   Weight: 237 lb 14.4 oz (107.9 kg)    Body mass index is 30.54 kg/m.  General: A&O x 3, WD, obese male  Pulmonary: Sym exp, respirations are non labored, good air movement in all fields, CTAB, no rales, rhonchi, or wheezing.  Cardiac: RRR, Nl S1, S2, no murmur appreciated  Vascular: Vessel Right Left  Radial 2+Palpable 2+Palpable  Carotid  without bruit  without bruit  Aorta Not palpable N/A  Femoral 2+Palpable 2+Palpable  Popliteal Not palpable Not palpable  PT 2+Palpable 2+Palpable  DP 2+Palpable 2+Palpable   Gastrointestinal: soft, NTND, -G/R, - HSM, - moderate sized reducible ventral hernia, - CVAT B.  Musculoskeletal: M/S 5/5 throughout, extremities without ischemic changes.  Skin: no rash, no cellulitis, no ulcers noted.   Neurologic: Pain and light touch intact in extremities, Motor exam as listed above    DATA  EVAR Duplex (Date: 11/12/17):  AAA sac size: 4.2 cm, right CIA: 1.6 cm, Left CIA: 1.7 cm  suboptimal visualization of the posterior wall due to shadowing and overlying bowel gas Previous: (Date: 11-11-16) AAA sac size: 3.93 cm  CTA Abd/Pelvis Duplex (Date: 10-31-17) done at ED for evaluation of Abdominal pain and swelling: Aorta: 3.9 cm infrarenal aortic aneurysm at proximal stent, Previously (11-06-15) 3.7 cm. Status post endovascular stent graft repair. The aneurysm sac about the bifurcation is 3.3 x 3.8 cm, previously 3.1 x 4 cm. No endoleak. Patent graft.   Medical Decision Making  Aaron [name] is a 61 y.o. male who presents s/p EVAR (Date: 09-22-12).  Pt is asymptomatic with stable  sac size at 3.9 cm on CTA 10-31-17, compared to 3.7 cm on 11-06-15.  I discussed with the patient the importance of surveillance of the endograft.  The next endograft duplex will be scheduled for 12 months.  The patient will follow up with [place] in 12 months with these studies.  I emphasized the importance of maximal medical management including strict control of blood pressure, blood glucose, and lipid levels, antiplatelet agents, obtaining regular exercise, and cessation of smoking.   Thank you for allowing  [place] to participate in this patient's care.  Clemon Chambers, RN, MSN, FNP-C [facility] [place] Office: [phone]  Clinic Physician: Oneida Alar  11/12/2017, 9:06 AM

## 2017-11-12 NOTE — Patient Instructions (Signed)
Before your next abdominal ultrasound:  Take two Extra-Strength Gas-X capsules at bedtime the night before the test. Take another two Extra-Strength Gas-X capsules 3 hours before the test.  Avoid gas forming foods the day before the test.       

## 2017-11-16 NOTE — Addendum Note (Signed)
Addended by: Lianne Cure A on: 11/16/2017 10:30 AM   Modules accepted: Orders

## 2017-11-27 ENCOUNTER — Telehealth: Payer: Self-pay | Admitting: *Deleted

## 2017-11-27 MED ORDER — BENZONATATE 100 MG PO CAPS: 100.0000 mg | ORAL_CAPSULE | Freq: Three times a day (TID) | ORAL | 0 refills | Status: DC | PRN

## 2017-11-27 NOTE — Telephone Encounter (Signed)
Patient notified and agreed. Faxed Rx to pharmacy.  

## 2017-11-27 NOTE — Telephone Encounter (Signed)
Patient stated that he has had a nagging cough for several days. Not coughing up anything. Keeps him awake at night. Coughs so much it makes his stomach sore. Grandkids has been diagnosed with RSV. Patient had some left over cough medication Hycet that he received last year, and he took it last night and it helped him stop coughing and sleep. Patient is out of it and requesting a refill. No available appointment. Confirmed pharmacy. Please Advise.

## 2017-11-27 NOTE — Telephone Encounter (Signed)
Lets give him some benzonatate 100 mg by mouth every 8 hours as needed for cough #20/0 refills for cough. Increase water intake.

## 2017-11-28 DIAGNOSIS — J4531 Mild persistent asthma with (acute) exacerbation: Secondary | ICD-10-CM | POA: Diagnosis not present

## 2017-12-01 ENCOUNTER — Telehealth: Payer: Self-pay | Admitting: *Deleted

## 2017-12-01 MED ORDER — ALBUTEROL SULFATE HFA 108 (90 BASE) MCG/ACT IN AERS: 2.0000 | INHALATION_SPRAY | Freq: Four times a day (QID) | RESPIRATORY_TRACT | 3 refills | Status: DC | PRN

## 2017-12-01 NOTE — Telephone Encounter (Signed)
Patient stated that the coughing pills, Tessalon did not help him so patient went to Urgent Care and was diagnosed with Bronchitis and they gave him ZPac and the Hydrocodone cough syrup. Patient stated that he is feeling better. Just wanted to let you know.   Also patient needed a refill on his Albuterol. Faxed to pharmacy.

## 2017-12-01 NOTE — Telephone Encounter (Signed)
That is good, okay to refill albuterol

## 2017-12-14 ENCOUNTER — Other Ambulatory Visit: Payer: Self-pay | Admitting: Nurse Practitioner

## 2017-12-14 DIAGNOSIS — K219 Gastro-esophageal reflux disease without esophagitis: Secondary | ICD-10-CM

## 2017-12-24 NOTE — Addendum Note (Signed)
Addended by: Lurena Nida on: 12/24/2017 03:03 PM   Modules accepted: Orders

## 2017-12-25 ENCOUNTER — Other Ambulatory Visit: Payer: 59

## 2017-12-25 DIAGNOSIS — I10 Essential (primary) hypertension: Secondary | ICD-10-CM | POA: Diagnosis not present

## 2017-12-25 DIAGNOSIS — E782 Mixed hyperlipidemia: Secondary | ICD-10-CM

## 2017-12-25 LAB — CBC WITH DIFFERENTIAL/PLATELET
Basophils Absolute: 74 cells/uL (ref 0–200)
Basophils Relative: 1.1 %
EOS PCT: 2 %
Eosinophils Absolute: 134 cells/uL (ref 15–500)
HCT: 45.8 % (ref 38.5–50.0)
Hemoglobin: 15.4 g/dL (ref 13.2–17.1)
LYMPHS ABS: 1561 {cells}/uL (ref 850–3900)
MCH: 28.9 pg (ref 27.0–33.0)
MCHC: 33.6 g/dL (ref 32.0–36.0)
MCV: 86.1 fL (ref 80.0–100.0)
MONOS PCT: 10.4 %
MPV: 9.3 fL (ref 7.5–12.5)
Neutro Abs: 4234 cells/uL (ref 1500–7800)
Neutrophils Relative %: 63.2 %
PLATELETS: 290 10*3/uL (ref 140–400)
RBC: 5.32 10*6/uL (ref 4.20–5.80)
RDW: 12.4 % (ref 11.0–15.0)
TOTAL LYMPHOCYTE: 23.3 %
WBC mixed population: 697 cells/uL (ref 200–950)
WBC: 6.7 10*3/uL (ref 3.8–10.8)

## 2017-12-25 LAB — COMPLETE METABOLIC PANEL WITH GFR
AG Ratio: 1.7 (calc) (ref 1.0–2.5)
ALKALINE PHOSPHATASE (APISO): 96 U/L (ref 40–115)
ALT: 22 U/L (ref 9–46)
AST: 17 U/L (ref 10–35)
Albumin: 4.3 g/dL (ref 3.6–5.1)
BUN: 12 mg/dL (ref 7–25)
CALCIUM: 9.6 mg/dL (ref 8.6–10.3)
CO2: 30 mmol/L (ref 20–32)
CREATININE: 0.92 mg/dL (ref 0.70–1.33)
Chloride: 106 mmol/L (ref 98–110)
GFR, Est African American: 105 mL/min/{1.73_m2} (ref >=60)
GFR, Est Non African American: 91 mL/min/{1.73_m2} (ref >=60)
GLUCOSE: 100 mg/dL — AB (ref 65–99)
Globulin: 2.5 g/dL (calc) (ref 1.9–3.7)
Potassium: 4.7 mmol/L (ref 3.5–5.3)
Sodium: 142 mmol/L (ref 135–146)
Total Bilirubin: 0.4 mg/dL (ref 0.2–1.2)
Total Protein: 6.8 g/dL (ref 6.1–8.1)

## 2017-12-25 LAB — LIPID PANEL
CHOLESTEROL: 126 mg/dL (ref <200)
HDL: 44 mg/dL (ref >40)
LDL Cholesterol (Calc): 69 mg/dL (calc)
NON-HDL CHOLESTEROL (CALC): 82 mg/dL (ref <130)
TRIGLYCERIDES: 53 mg/dL (ref <150)
Total CHOL/HDL Ratio: 2.9 (calc) (ref <5.0)

## 2017-12-30 ENCOUNTER — Ambulatory Visit (INDEPENDENT_AMBULATORY_CARE_PROVIDER_SITE_OTHER): Payer: 59 | Admitting: Nurse Practitioner

## 2017-12-30 ENCOUNTER — Encounter: Payer: Self-pay | Admitting: Nurse Practitioner

## 2017-12-30 VITALS — BP 138/94 | HR 95 | Temp 98.2°F | Ht 74.0 in | Wt 240.0 lb

## 2017-12-30 DIAGNOSIS — J4540 Moderate persistent asthma, uncomplicated: Secondary | ICD-10-CM

## 2017-12-30 DIAGNOSIS — K219 Gastro-esophageal reflux disease without esophagitis: Secondary | ICD-10-CM

## 2017-12-30 DIAGNOSIS — I10 Essential (primary) hypertension: Secondary | ICD-10-CM

## 2017-12-30 DIAGNOSIS — N529 Male erectile dysfunction, unspecified: Secondary | ICD-10-CM | POA: Diagnosis not present

## 2017-12-30 DIAGNOSIS — I714 Abdominal aortic aneurysm, without rupture, unspecified: Secondary | ICD-10-CM

## 2017-12-30 DIAGNOSIS — E782 Mixed hyperlipidemia: Secondary | ICD-10-CM

## 2017-12-30 MED ORDER — PANTOPRAZOLE SODIUM 40 MG PO TBEC: 40.0000 mg | DELAYED_RELEASE_TABLET | Freq: Every day | ORAL | 1 refills | Status: DC

## 2017-12-30 MED ORDER — SIMVASTATIN 20 MG PO TABS: 20.0000 mg | ORAL_TABLET | Freq: Every day | ORAL | 1 refills | Status: DC

## 2017-12-30 MED ORDER — SILDENAFIL CITRATE 50 MG PO TABS: 50.0000 mg | ORAL_TABLET | Freq: Every day | ORAL | 2 refills | Status: DC | PRN

## 2017-12-30 MED ORDER — FLUTICASONE FUROATE-VILANTEROL 200-25 MCG/INH IN AEPB: 1.0000 | INHALATION_SPRAY | Freq: Every day | RESPIRATORY_TRACT | 3 refills | Status: DC

## 2017-12-30 MED ORDER — LOSARTAN POTASSIUM 50 MG PO TABS: ORAL_TABLET | ORAL | 1 refills | Status: DC

## 2017-12-30 NOTE — Progress Notes (Signed)
Careteam: [name], NP as PCP - General (Nurse Practitioner) [name], [name], NP as Nurse Practitioner (Vascular Surgery) [name], MD as Consulting Physician (General Surgery) Brand Males, MD as Consulting Physician (Pulmonary Disease) [name], MD as Consulting Physician (Cardiology) [name], PA-C as Physician Assistant (Dermatology)  Advanced Directive information    No Known Allergies  Chief Complaint  Patient presents with  . Medical Management of Chronic Issues    Pt is being seen for a 6 month routine visit. Pt reports he has no concerns today.      HPI: Patient is a 62 y.o. male seen in the office today for routine follow up.   AAA- following with vascular; Dr. Kellie Simmering has been monitoringpost endovascular stent graft repair of abdominal aortic aneurysm on 09-22-12. He quit smokingin 2013   HTN- does not take blood pressure reading at home. On cozaar   GERD- using protonix which controls symptoms. No issues currently  Asthma- stable on breo and PRN albuterol using twice weekly.   Hyperlipidemia- LDL at goal on simvastatin.   No diet or exercise, states "got to start"  Ex wife recently diet of pancreatic cancer, daughters not taking this well.   Review of Systems:  Review of Systems  Constitutional: Negative for chills, fever and weight loss.  HENT: Negative for tinnitus.   Respiratory: Negative for cough, sputum production and shortness of breath.   Cardiovascular: Negative for chest pain, palpitations and leg swelling.  Gastrointestinal: Negative for abdominal pain, constipation, diarrhea and heartburn.  Genitourinary: Negative for dysuria, frequency and urgency.  Musculoskeletal: Negative for back pain, falls, joint pain and myalgias.  Skin: Negative.   Neurological: Negative for dizziness and headaches.  Psychiatric/Behavioral: Negative for depression and memory loss. The patient does not have  insomnia.     Past Medical History:  Diagnosis Date  . Abdominal aortic aneurysm [facility])    sees Dr. Einar Gip for cardiac f/u, [phone]  . Arthritis    lower back  . Asthma    followed by Dr. Berdie Ogren  . Chronic airway obstruction, not elsewhere classified   . Dyspnea    normal PFT April 2012  . External hemorrhoids without mention of complication   . GERD (gastroesophageal reflux disease)   . Gout   . H/O hiatal hernia   . Hemorrhoids   . HTN (hypertension)    hx of sees Dr. Graylin Shiver  . Hyperlipidemia   . Insomnia, unspecified   . Other abnormal blood chemistry   . Other dyspnea and respiratory abnormality   . Other malaise and fatigue   . Other specified erythematous condition(695.89)   . Other testicular dysfunction   . Prostatitis, unspecified   . Rash 2011   left chest, biopsy 2012 Dr. Rozann Lesches, results pending  . Skin cancer   . Tobacco abuse    Past Surgical History:  Procedure Laterality Date  . ABDOMINAL AORTIC ENDOVASCULAR STENT GRAFT  09/22/2012   Procedure: ABDOMINAL AORTIC ENDOVASCULAR STENT GRAFT;  Surgeon: Mal Misty, MD;  Location: [place];  Service: Vascular;  Laterality: N/A;  GORE; ultrasound guided.  . APPENDECTOMY    . EVALUATION UNDER ANESTHESIA WITH HEMORRHOIDECTOMY N/A 12/31/2016   Procedure: EXAM UNDER ANESTHESIA WITH HEMORRHOIDECTOMY;  Surgeon: [name], MD;  Location: [place];  Service: General;  Laterality: N/A;  . SKIN CANCER EXCISION    . TOE SURGERY  01/2012   joint of left great toe  . TONSILLECTOMY    .  TONSILLECTOMY    . TYMPANOPLASTY    . WRIST SURGERY     left   Social History:   reports that he quit smoking about 5 years ago. His smoking use included cigarettes. He has a 10.50 pack-year smoking history. He has quit using smokeless tobacco. His smokeless tobacco use included snuff. He reports that he does not drink alcohol or use drugs.  Family History  Problem Relation Age of Onset  . Heart  attack Father 44  . COPD Father   . Heart disease Father   . Asthma Daughter   . Cancer Mother        Breast  . Lupus Daughter   . Diabetes Other        Grandson     Medications: Patient's Medications  New Prescriptions   No medications on file  Previous Medications   ALBUTEROL (PROVENTIL HFA;VENTOLIN HFA) 108 (90 BASE) MCG/ACT INHALER    Inhale 2 puffs into the lungs every 6 (six) hours as needed. For cough and wheezing.   ASPIRIN EC 81 MG TABLET    Take 81 mg by mouth daily.   FLUTICASONE FUROATE-VILANTEROL (BREO ELLIPTA) 200-25 MCG/INH AEPB    Inhale 1 puff into the lungs daily.   LOSARTAN (COZAAR) 50 MG TABLET    TAKE 1 TABLET BY MOUTH ONCE DAILY TO CONTROL BLOOD PRESSURE   PANTOPRAZOLE (PROTONIX) 40 MG TABLET    TAKE 1 TABLET BY MOUTH EVERY DAY   SILDENAFIL (VIAGRA) 50 MG TABLET    Take 1 tablet (50 mg total) by mouth daily as needed for erectile dysfunction.   SIMVASTATIN (ZOCOR) 20 MG TABLET    Take 1 tablet (20 mg total) by mouth at bedtime.  Modified Medications   No medications on file  Discontinued Medications   DOCUSATE SODIUM (COLACE) 100 MG CAPSULE    Take 100 mg by mouth once as needed for mild constipation.   SIMETHICONE (GAS-X) 80 MG CHEWABLE TABLET    Chew 80 mg by mouth once as needed for flatulence.     Physical Exam:  Vitals:   12/30/17 1517  BP: (!) 138/94  Pulse: 95  Temp: 98.2 F (36.8 C)  TempSrc: Oral  SpO2: 96%  Weight: 240 lb (108.9 kg)  Height: 6\' 2"  (1.88 m)   Body mass index is 30.81 kg/m.  Physical Exam  Constitutional: He is oriented to person, place, and time. He appears well-developed and well-nourished. No distress.  HENT:  Head: Normocephalic and atraumatic.  Mouth/Throat: Oropharynx is clear and moist. No oropharyngeal exudate.  Eyes: Conjunctivae and EOM are normal. Pupils are equal, round, and reactive to light.  Neck: Normal range of motion. Neck supple.  Cardiovascular: Normal rate, regular rhythm and normal heart sounds.   Pulmonary/Chest: Effort normal and breath sounds normal.  Abdominal: Soft. Bowel sounds are normal.  Musculoskeletal: He exhibits no edema, tenderness or deformity.  Neurological: He is alert and oriented to person, place, and time.  Skin: Skin is warm and dry. He is not diaphoretic.  Psychiatric: He has a normal mood and affect.    Labs reviewed: Basic Metabolic Panel: Recent Labs    10/31/17 2057 12/25/17 0809  NA 143 142  K 3.6 4.7  CL 108 106  CO2 28 30  GLUCOSE 111* 100*  BUN 14 12  CREATININE 1.15 0.92  CALCIUM 9.4 9.6   Liver Function Tests: Recent Labs    10/31/17 2057 12/25/17 0809  AST 29 17  ALT 32 22  ALKPHOS  100  --   BILITOT 0.5 0.4  PROT 7.7 6.8  ALBUMIN 4.4  --    Recent Labs    10/31/17 2057  LIPASE 23   No results for input(s): AMMONIA in the last 8760 hours. CBC: Recent Labs    10/31/17 2057 12/25/17 0809  WBC 12.0* 6.7  NEUTROABS  --  4,234  HGB 16.0 15.4  HCT 48.9 45.8  MCV 93.5 86.1  PLT 282 290   Lipid Panel: Recent Labs    12/25/17 0809  CHOL 126  HDL 44  TRIG 53  CHOLHDL 2.9   TSH: No results for input(s): TSH in the last 8760 hours. A1C: Lab Results  Component Value Date   HGBA1C 6.0 (H) 08/09/2015     Assessment/Plan 1. Essential hypertension -elevated today, reports nervous due to lab work, states generally better out of office but has not taken recently -instructed to take blood pressure at home and notify of readings if they remain elevated -dash diet given.  - losartan (COZAAR) 50 MG tablet; TAKE 1 TABLET BY MOUTH ONCE DAILY TO CONTROL BLOOD PRESSURE  Dispense: 90 tablet; Refill: 1  2. Moderate persistent asthma without complication Stable, will cont current regimen. Using albuterol PRN - fluticasone furoate-vilanterol (BREO ELLIPTA) 200-25 MCG/INH AEPB; Inhale 1 puff into the lungs daily.  Dispense: 60 each; Refill: 3  3. Gastroesophageal reflux disease, esophagitis presence not specified -controlled  on current regimen. - pantoprazole (PROTONIX) 40 MG tablet; Take 1 tablet (40 mg total) by mouth daily.  Dispense: 90 tablet; Refill: 1  4. Mixed hyperlipidemia -LDL controlled on current regimen, cont dietary modifications, encouraged exercise.  - simvastatin (ZOCOR) 20 MG tablet; Take 1 tablet (20 mg total) by mouth at bedtime.  Dispense: 90 tablet; Refill: 1   Next appt: 6 month follow up for physical  Aaron [name]  [facility] [phone] 8 am - 5 pm) [phone] (after hours)

## 2017-12-30 NOTE — Patient Instructions (Addendum)
Take blood pressure at home and blood pressure should be < 140/90, please notify office if blood pressure staying elevated consistently then notify office     DASH Eating Plan [place] stands for "Dietary Approaches to Stop Hypertension." The DASH eating plan is a healthy eating plan that has been shown to reduce high blood pressure (hypertension). It may also reduce your risk for type 2 diabetes, heart disease, and stroke. The DASH eating plan may also help with weight loss. What are tips for following this plan? General guidelines  Avoid eating more than 2,300 mg (milligrams) of salt (sodium) a day. If you have hypertension, you may need to reduce your sodium intake to 1,500 mg a day.  Limit alcohol intake to no more than 1 drink a day for nonpregnant women and 2 drinks a day for men. One drink equals 12 oz of beer, 5 oz of wine, or 1 oz of hard liquor.  Work with your health care provider to maintain a healthy body weight or to lose weight. Ask what an ideal weight is for you.  Get at least 30 minutes of exercise that causes your heart to beat faster (aerobic exercise) most days of the week. Activities may include walking, swimming, or biking.  Work with your health care provider or diet and nutrition specialist (dietitian) to adjust your eating plan to your individual calorie needs. Reading food labels  Check food labels for the amount of sodium per serving. Choose foods with less than 5 percent of the Daily Value of sodium. Generally, foods with less than 300 mg of sodium per serving fit into this eating plan.  To find whole grains, look for the word "whole" as the first word in the ingredient list. Shopping  Buy products labeled as "low-sodium" or "no salt added."  Buy fresh foods. Avoid canned foods and premade or frozen meals. Cooking  Avoid adding salt when cooking. Use salt-free seasonings or herbs instead of table salt or sea salt. Check with your health care provider or  pharmacist before using salt substitutes.  Do not fry foods. Cook foods using healthy methods such as baking, boiling, grilling, and broiling instead.  Cook with heart-healthy oils, such as olive, canola, soybean, or sunflower oil. Meal planning   Eat a balanced diet that includes: ? 5 or more servings of fruits and vegetables each day. At each meal, try to fill half of your plate with fruits and vegetables. ? Up to 6-8 servings of whole grains each day. ? Less than 6 oz of lean meat, poultry, or fish each day. A 3-oz serving of meat is about the same size as a deck of cards. One egg equals 1 oz. ? 2 servings of low-fat dairy each day. ? A serving of nuts, seeds, or beans 5 times each week. ? Heart-healthy fats. Healthy fats called Omega-3 fatty acids are found in foods such as flaxseeds and coldwater fish, like sardines, salmon, and mackerel.  Limit how much you eat of the following: ? Canned or prepackaged foods. ? Food that is high in trans fat, such as fried foods. ? Food that is high in saturated fat, such as fatty meat. ? Sweets, desserts, sugary drinks, and other foods with added sugar. ? Full-fat dairy products.  Do not salt foods before eating.  Try to eat at least 2 vegetarian meals each week.  Eat more home-cooked food and less restaurant, buffet, and fast food.  When eating at a restaurant, ask that your food be  prepared with less salt or no salt, if possible. What foods are recommended? The items listed may not be a complete list. Talk with your dietitian about what dietary choices are best for you. Grains Whole-grain or whole-wheat bread. Whole-grain or whole-wheat pasta. Brown rice. [place]. Bulgur. Whole-grain and low-sodium cereals. Pita bread. Low-fat, low-sodium crackers. Whole-wheat flour tortillas. Vegetables Fresh or frozen vegetables (raw, steamed, roasted, or grilled). Low-sodium or reduced-sodium tomato and vegetable juice. Low-sodium or  reduced-sodium tomato sauce and tomato paste. Low-sodium or reduced-sodium canned vegetables. Fruits All fresh, dried, or frozen fruit. Canned fruit in natural juice (without added sugar). Meat and other protein foods Skinless chicken or [place]. Ground chicken or [place]. Pork with fat trimmed off. Fish and seafood. Egg whites. Dried beans, peas, or lentils. Unsalted nuts, nut butters, and seeds. Unsalted canned beans. Lean cuts of beef with fat trimmed off. Low-sodium, lean deli meat. Dairy Low-fat (1%) or fat-free (skim) milk. Fat-free, low-fat, or reduced-fat cheeses. Nonfat, low-sodium ricotta or cottage cheese. Low-fat or nonfat yogurt. Low-fat, low-sodium cheese. Fats and oils Soft margarine without trans fats. Vegetable oil. Low-fat, reduced-fat, or light mayonnaise and salad dressings (reduced-sodium). Canola, safflower, olive, soybean, and sunflower oils. Avocado. Seasoning and other foods Herbs. Spices. Seasoning mixes without salt. Unsalted popcorn and pretzels. Fat-free sweets. What foods are not recommended? The items listed may not be a complete list. Talk with your dietitian about what dietary choices are best for you. Grains Baked goods made with fat, such as croissants, muffins, or some breads. Dry pasta or rice meal packs. Vegetables Creamed or fried vegetables. Vegetables in a cheese sauce. Regular canned vegetables (not low-sodium or reduced-sodium). Regular canned tomato sauce and paste (not low-sodium or reduced-sodium). Regular tomato and vegetable juice (not low-sodium or reduced-sodium). Angie Fava. Olives. Fruits Canned fruit in a light or heavy syrup. Fried fruit. Fruit in cream or butter sauce. Meat and other protein foods Fatty cuts of meat. Ribs. Fried meat. Berniece Salines. Sausage. Bologna and other processed lunch meats. Salami. Fatback. Hotdogs. Bratwurst. Salted nuts and seeds. Canned beans with added salt. Canned or smoked fish. Whole eggs or egg yolks. Chicken or [place]  with skin. Dairy Whole or 2% milk, cream, and half-and-half. Whole or full-fat cream cheese. Whole-fat or sweetened yogurt. Full-fat cheese. Nondairy creamers. Whipped toppings. Processed cheese and cheese spreads. Fats and oils Butter. Stick margarine. Lard. Shortening. Ghee. Bacon fat. Tropical oils, such as coconut, palm kernel, or palm oil. Seasoning and other foods Salted popcorn and pretzels. Onion salt, garlic salt, seasoned salt, table salt, and sea salt. Worcestershire sauce. Tartar sauce. Barbecue sauce. Teriyaki sauce. Soy sauce, including reduced-sodium. Steak sauce. Canned and packaged gravies. Fish sauce. Oyster sauce. Cocktail sauce. Horseradish that you find on the shelf. Ketchup. Mustard. Meat flavorings and tenderizers. Bouillon cubes. Hot sauce and Tabasco sauce. Premade or packaged marinades. Premade or packaged taco seasonings. Relishes. Regular salad dressings. Where to find more information:  National Heart, Lung, and [place]: https://wilson-eaton.com/  American Heart Association: www.heart.org Summary  The DASH eating plan is a healthy eating plan that has been shown to reduce high blood pressure (hypertension). It may also reduce your risk for type 2 diabetes, heart disease, and stroke.  With the DASH eating plan, you should limit salt (sodium) intake to 2,300 mg a day. If you have hypertension, you may need to reduce your sodium intake to 1,500 mg a day.  When on the DASH eating plan, aim to eat more fresh fruits and vegetables, whole grains,  lean proteins, low-fat dairy, and heart-healthy fats.  Work with your health care provider or diet and nutrition specialist (dietitian) to adjust your eating plan to your individual calorie needs. This information is not intended to replace advice given to you by your health care provider. Make sure you discuss any questions you have with your health care provider. Document Released: 10/30/2011 Document Revised: 11/03/2016  Document Reviewed: 11/03/2016 Elsevier Interactive Patient Education  [employer].

## 2018-01-26 ENCOUNTER — Telehealth: Payer: Self-pay | Admitting: *Deleted

## 2018-01-26 NOTE — Telephone Encounter (Signed)
Patient called and stated that he woke up this morning with Diarrhea and has gone 5 times today. Wanted to know what he could do. No Fever and no N&V. Instructed patient to eat a [employer] and gave him a list of things to eat. Also told him to stay hydrated with clear liquids. He agreed and will call me back in the morning. Stated he will schedule an appointment tomorrow if no better.

## 2018-01-29 ENCOUNTER — Ambulatory Visit: Payer: Self-pay | Admitting: Nurse Practitioner

## 2018-02-10 ENCOUNTER — Ambulatory Visit
Admission: RE | Admit: 2018-02-10 | Discharge: 2018-02-10 | Disposition: A | Discharge status: Home / Self Care | Payer: 59 | Source: Ambulatory Visit | Attending: Nurse Practitioner | Admitting: Nurse Practitioner

## 2018-02-10 ENCOUNTER — Encounter: Payer: Self-pay | Admitting: Nurse Practitioner

## 2018-02-10 ENCOUNTER — Telehealth: Payer: Self-pay | Admitting: *Deleted

## 2018-02-10 ENCOUNTER — Ambulatory Visit (INDEPENDENT_AMBULATORY_CARE_PROVIDER_SITE_OTHER): Payer: 59 | Admitting: Nurse Practitioner

## 2018-02-10 VITALS — BP 136/84 | HR 97 | Temp 99.4°F | Ht 74.0 in | Wt 243.0 lb

## 2018-02-10 DIAGNOSIS — J209 Acute bronchitis, unspecified: Secondary | ICD-10-CM | POA: Diagnosis not present

## 2018-02-10 DIAGNOSIS — J440 Chronic obstructive pulmonary disease with acute lower respiratory infection: Secondary | ICD-10-CM

## 2018-02-10 DIAGNOSIS — R05 Cough: Secondary | ICD-10-CM | POA: Diagnosis not present

## 2018-02-10 IMAGING — DX DG CHEST 2V
2 series · 2 of 2 positions shown · non-contrast
Comparison: CT chest [DATE]

CLINICAL DATA: Acute bronchitis, COPD.  Cough and fever

EXAM:
CHEST - 2 VIEW

[dg chest 2 view (1 of 2)]
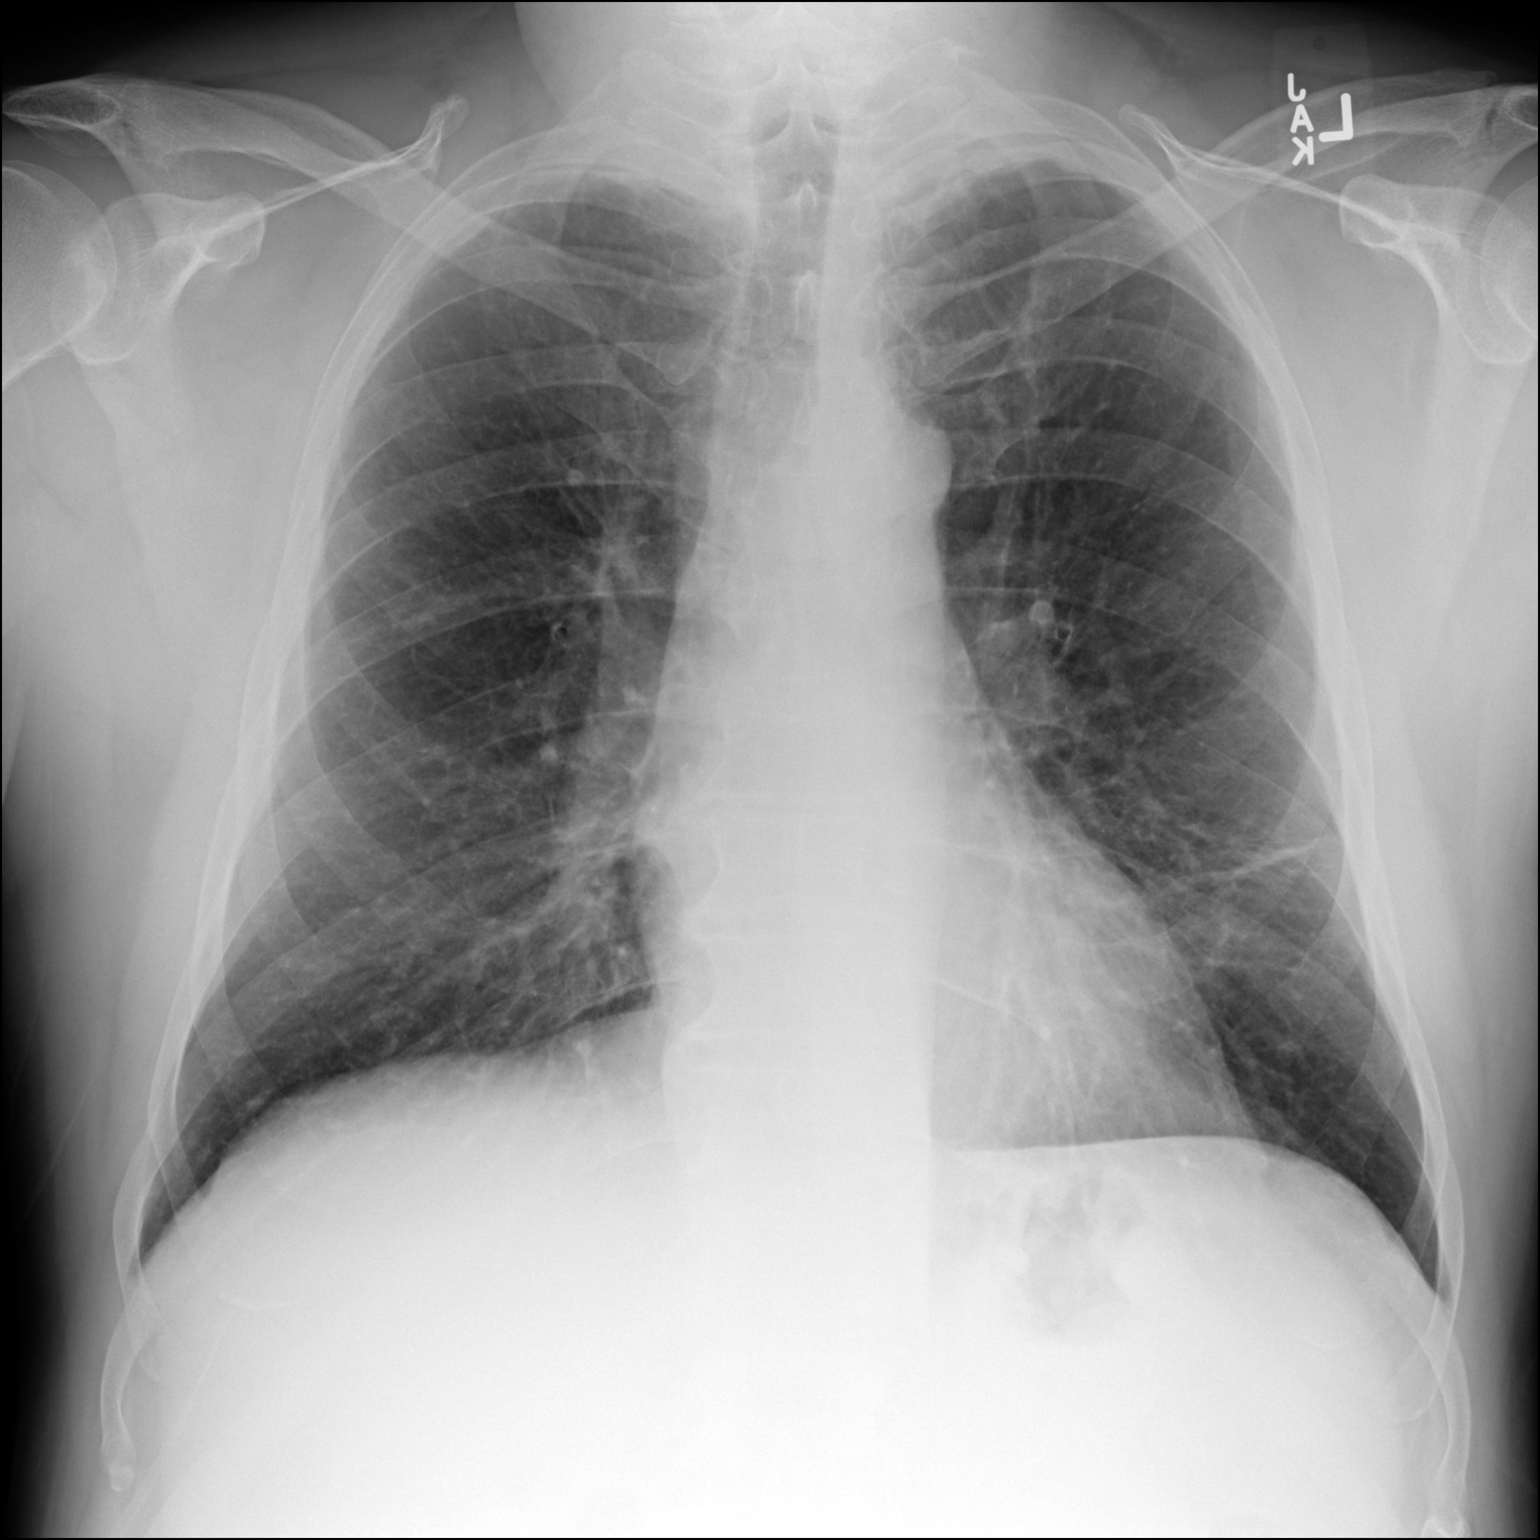

[dg chest 2 view (2 of 2)]
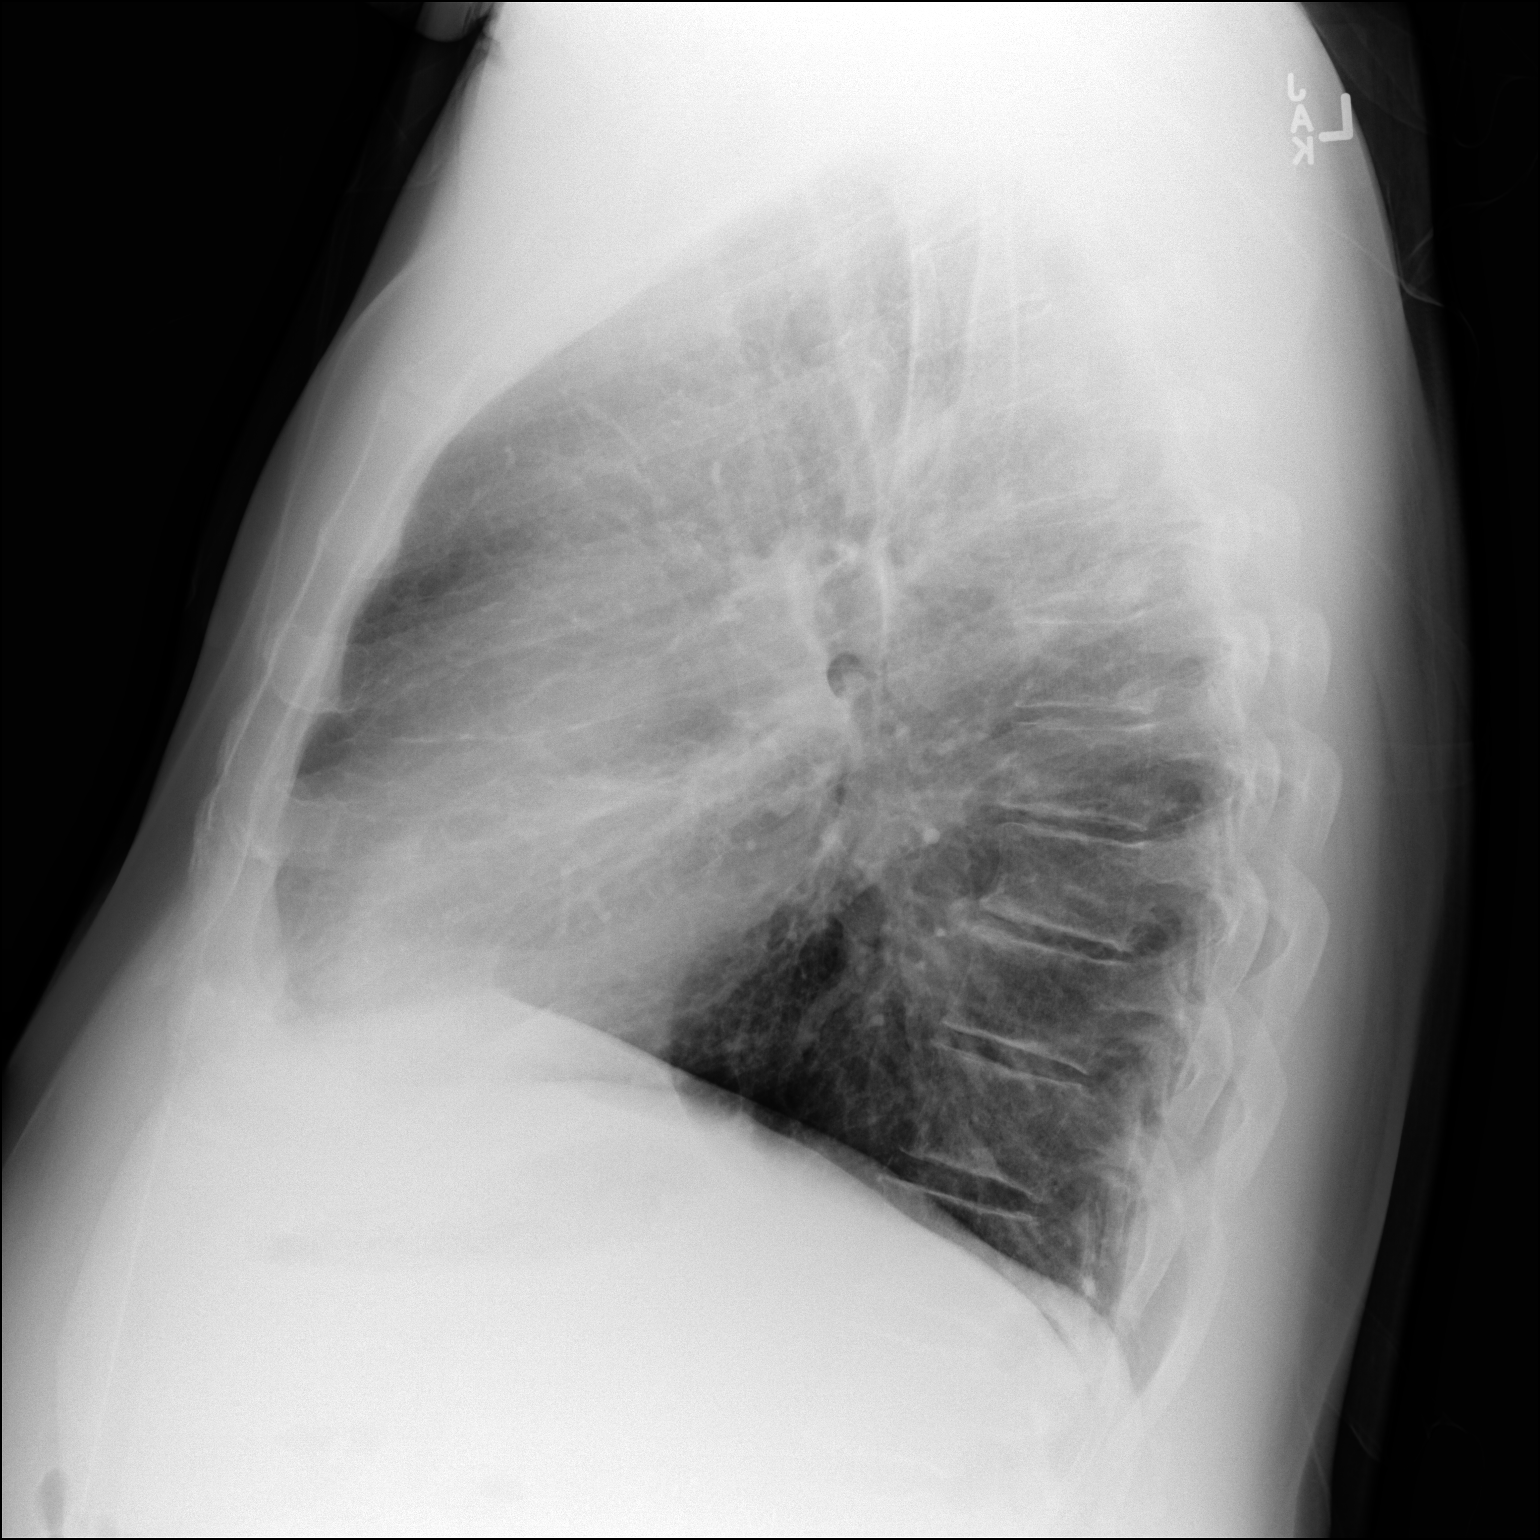

[2 of 2 positions shown; findings below may reference images not displayed]

FINDINGS: Mild hyperinflation. Mild apical scarring bilaterally. Heart size
and vascularity normal. Linear atelectasis/scarring in the lingula.
Negative for infiltrate or effusion.
IMPRESSION: No active cardiopulmonary disease.

## 2018-02-10 MED ORDER — HYDROCOD POLST-CPM POLST ER 10-8 MG/5ML PO SUER: 5.0000 mL | Freq: Two times a day (BID) | ORAL | 0 refills | Status: DC | PRN

## 2018-02-10 MED ORDER — DOXYCYCLINE HYCLATE 100 MG PO TABS: 100.0000 mg | ORAL_TABLET | Freq: Two times a day (BID) | ORAL | 0 refills | Status: DC

## 2018-02-10 NOTE — Telephone Encounter (Signed)
Patient notified and agreed. Will check his schedule and call back for appointment.

## 2018-02-10 NOTE — Telephone Encounter (Signed)
Patient called and stated that he has Bronchitis. Stated that he had this a couple months ago and prescribed Prednisone. Patient is requesting another round of Prednisone. Patient is coughing all the time, chest congestion, no fever. Offered an appointment for today but patient is in [place] on a job. Please Advise.

## 2018-02-10 NOTE — Telephone Encounter (Signed)
He will need to be seen for Rx for prednisone, if he is not wheezing this may not be indicated Encouraged to be seen. He can use mucinex DM by mouth twice daily with full glass of water and to use rescue inhaler as needed for shortness of breath and wheezing at this time.

## 2018-02-10 NOTE — Patient Instructions (Addendum)
Doxycyline 100 mg by mouth twice daily with full glass of water To take mucinex DM by mouth twice daily with full glass of water for 7 days then as needed  Increase hydration   Acute Bronchitis, Adult Acute bronchitis is when air tubes (bronchi) in the lungs suddenly get swollen. The condition can make it hard to breathe. It can also cause these symptoms:  A cough.  Coughing up clear, yellow, or green mucus.  Wheezing.  Chest congestion.  Shortness of breath.  A fever.  Body aches.  Chills.  A sore throat.  Follow these instructions at home: Medicines  Take over-the-counter and prescription medicines only as told by your doctor.  If you were prescribed an antibiotic medicine, take it as told by your doctor. Do not stop taking the antibiotic even if you start to feel better. General instructions  Rest.  Drink enough fluids to keep your pee (urine) clear or pale yellow.  Avoid smoking and secondhand smoke. If you smoke and you need help quitting, ask your doctor. Quitting will help your lungs heal faster.  Use an inhaler, cool mist vaporizer, or humidifier as told by your doctor.  Keep all follow-up visits as told by your doctor. This is important. How is this prevented? To lower your risk of getting this condition again:  Wash your hands often with soap and water. If you cannot use soap and water, use hand sanitizer.  Avoid contact with people who have cold symptoms.  Try not to touch your hands to your mouth, nose, or eyes.  Make sure to get the flu shot every year.  Contact a doctor if:  Your symptoms do not get better in 2 weeks. Get help right away if:  You cough up blood.  You have chest pain.  You have very bad shortness of breath.  You become dehydrated.  You faint (pass out) or keep feeling like you are going to pass out.  You keep throwing up (vomiting).  You have a very bad headache.  Your fever or chills gets worse. This information  is not intended to replace advice given to you by your health care provider. Make sure you discuss any questions you have with your health care provider. Document Released: 04/28/2008 Document Revised: 06/18/2016 Document Reviewed: 04/30/2016 Elsevier Interactive Patient Education  [employer].

## 2018-02-10 NOTE — Progress Notes (Signed)
Careteam: [name], NP as PCP - General (Nurse Practitioner) [name], [name], NP as Nurse Practitioner (Vascular Surgery) [name], MD as Consulting Physician (General Surgery) Brand Males, MD as Consulting Physician (Pulmonary Disease) [name], MD as Consulting Physician (Cardiology) [name], PA-C as Physician Assistant (Dermatology)  Advanced Directive information    No Known Allergies  Chief Complaint  Patient presents with  . Acute Visit    Pt is being seen due to cough with clear mucus. Pt has had cough for a week but it worsened in last 3 days. Pt reports chest hurts from coughing.      HPI: Patient is a 62 y.o. male seen in the office today due to worsening cough and congestion last 3 days. Symptoms for ~1 week.  Reports he stayed up most the night last night coughing, clear sputum.  No shortness of breath but used inhaler but did not stop coughing.  Went to urgent care about 3 months ago due to similar symptoms which cleared everything up.  Chest feels tight, unsure if he is wheezing. No wheezing noted  Has taken tessalon pearls which was not effective- took 2 at a time which was not effective; attempted to take again last night.  Coughing is biggest issue today.   Review of Systems:  Review of Systems  Constitutional: Positive for malaise/fatigue. Negative for chills and fever.  HENT: Negative for congestion, ear discharge, ear pain, sinus pain and sore throat.   Respiratory: Positive for cough and sputum production. Negative for shortness of breath and wheezing.   Cardiovascular: Negative for chest pain and palpitations.  Neurological: Negative for dizziness, weakness and headaches.    Past Medical History:  Diagnosis Date  . Abdominal aortic aneurysm [facility])    sees Dr. Einar Gip for cardiac f/u, [phone]  . Arthritis    lower back  . Asthma    followed by Dr. Berdie Ogren  . Chronic airway  obstruction, not elsewhere classified   . Dyspnea    normal PFT April 2012  . External hemorrhoids without mention of complication   . GERD (gastroesophageal reflux disease)   . Gout   . H/O hiatal hernia   . Hemorrhoids   . HTN (hypertension)    hx of sees Dr. Graylin Shiver  . Hyperlipidemia   . Insomnia, unspecified   . Other abnormal blood chemistry   . Other dyspnea and respiratory abnormality   . Other malaise and fatigue   . Other specified erythematous condition(695.89)   . Other testicular dysfunction   . Prostatitis, unspecified   . Rash 2011   left chest, biopsy 2012 Dr. Rozann Lesches, results pending  . Skin cancer   . Tobacco abuse    Past Surgical History:  Procedure Laterality Date  . ABDOMINAL AORTIC ENDOVASCULAR STENT GRAFT  09/22/2012   Procedure: ABDOMINAL AORTIC ENDOVASCULAR STENT GRAFT;  Surgeon: Mal Misty, MD;  Location: [place];  Service: Vascular;  Laterality: N/A;  GORE; ultrasound guided.  . APPENDECTOMY    . EVALUATION UNDER ANESTHESIA WITH HEMORRHOIDECTOMY N/A 12/31/2016   Procedure: EXAM UNDER ANESTHESIA WITH HEMORRHOIDECTOMY;  Surgeon: [name], MD;  Location: [place];  Service: General;  Laterality: N/A;  . SKIN CANCER EXCISION    . TOE SURGERY  01/2012   joint of left great toe  . TONSILLECTOMY    . TONSILLECTOMY    . TYMPANOPLASTY    . WRIST SURGERY     left  Social History:   reports that he quit smoking about 5 years ago. His smoking use included cigarettes. He has a 10.50 pack-year smoking history. He has quit using smokeless tobacco. His smokeless tobacco use included snuff. He reports that he does not drink alcohol or use drugs.  Family History  Problem Relation Age of Onset  . Heart attack Father 13  . COPD Father   . Heart disease Father   . Asthma Daughter   . Cancer Mother        Breast  . Lupus Daughter   . Diabetes Other        Grandson     Medications: Patient's Medications  New Prescriptions   No  medications on file  Previous Medications   ALBUTEROL (PROVENTIL HFA;VENTOLIN HFA) 108 (90 BASE) MCG/ACT INHALER    Inhale 2 puffs into the lungs every 6 (six) hours as needed. For cough and wheezing.   ASPIRIN EC 81 MG TABLET    Take 81 mg by mouth daily.   FLUTICASONE FUROATE-VILANTEROL (BREO ELLIPTA) 200-25 MCG/INH AEPB    Inhale 1 puff into the lungs daily.   LOSARTAN (COZAAR) 50 MG TABLET    TAKE 1 TABLET BY MOUTH ONCE DAILY TO CONTROL BLOOD PRESSURE   PANTOPRAZOLE (PROTONIX) 40 MG TABLET    Take 1 tablet (40 mg total) by mouth daily.   SILDENAFIL (VIAGRA) 50 MG TABLET    Take 1 tablet (50 mg total) by mouth daily as needed for erectile dysfunction.   SIMVASTATIN (ZOCOR) 20 MG TABLET    Take 1 tablet (20 mg total) by mouth at bedtime.  Modified Medications   No medications on file  Discontinued Medications   No medications on file     Physical Exam:  Vitals:   02/10/18 1535  BP: (!) 160/90  Pulse: 97  Temp: 99.4 F (37.4 C)  TempSrc: Oral  SpO2: 97%  Weight: 243 lb (110.2 kg)  Height: _0  (1.88 m)   Body mass index is 31.2 kg/m.  Physical Exam  Constitutional: He is oriented to person, place, and time. He appears well-developed and well-nourished.  HENT:  Head: Normocephalic and atraumatic.  Right Ear: External ear normal.  Left Ear: External ear normal.  Nose: Mucosal edema and rhinorrhea present.  Mouth/Throat: Oropharynx is clear and moist. No oropharyngeal exudate.  Cardiovascular: Normal rate, regular rhythm and normal heart sounds.  Pulmonary/Chest: Effort normal and breath sounds normal. He has no wheezes.  Musculoskeletal: He exhibits no edema.  Neurological: He is alert and oriented to person, place, and time.    Labs reviewed: Basic Metabolic Panel: Recent Labs    10/31/17 2057 12/25/17 0809  NA 143 142  K 3.6 4.7  CL 108 106  CO2 28 30  GLUCOSE 111* 100*  BUN 14 12  CREATININE 1.15 0.92  CALCIUM 9.4 9.6   Liver Function Tests: Recent  Labs    10/31/17 2057 12/25/17 0809  AST 29 17  ALT 32 22  ALKPHOS 100  --   BILITOT 0.5 0.4  PROT 7.7 6.8  ALBUMIN 4.4  --    Recent Labs    10/31/17 2057  LIPASE 23   No results for input(s): AMMONIA in the last 8760 hours. CBC: Recent Labs    10/31/17 2057 12/25/17 0809  WBC 12.0* 6.7  NEUTROABS  --  4,234  HGB 16.0 15.4  HCT 48.9 45.8  MCV 93.5 86.1  PLT 282 290   Lipid Panel: Recent Labs  12/25/17 0809  CHOL 126  HDL 44  LDLCALC 69  TRIG 53  CHOLHDL 2.9   TSH: No results for input(s): TSH in the last 8760 hours. A1C: Lab Results  Component Value Date   HGBA1C 6.0 (H) 08/09/2015     Assessment/Plan 1. Acute bronchitis with COPD ([place]) -mucinex DM by mouth twice daily for 1 week with full glass of water- -Increase hydration - CBC with Differential/Platelets - BMP with eGFR([facility]) - DG Chest 2 View - doxycycline (VIBRA-TABS) 100 MG tablet; Take 1 tablet (100 mg total) by mouth 2 (two) times daily.  Dispense: 14 tablet; Refill: 0 - chlorpheniramine-HYDROcodone (TUSSIONEX PENNKINETIC ER) 10-8 MG/5ML SUER; Take 5 mLs by mouth every 12 (twelve) hours as needed for cough.  Dispense: 115 mL; Refill: 0  -return precautions discussed  Jamillia Closson K. [place], [facility] [phone]

## 2018-02-11 ENCOUNTER — Other Ambulatory Visit: Payer: Self-pay | Admitting: Nurse Practitioner

## 2018-02-11 LAB — BASIC METABOLIC PANEL WITH GFR
BUN: 10 mg/dL (ref 7–25)
CALCIUM: 8.8 mg/dL (ref 8.6–10.3)
CHLORIDE: 103 mmol/L (ref 98–110)
CO2: 27 mmol/L (ref 20–32)
Creat: 1.02 mg/dL (ref 0.70–1.33)
GFR, EST AFRICAN AMERICAN: 93 mL/min/{1.73_m2} (ref >=60)
GFR, Est Non African American: 80 mL/min/{1.73_m2} (ref >=60)
GLUCOSE: 112 mg/dL (ref 65–139)
POTASSIUM: 4 mmol/L (ref 3.5–5.3)
Sodium: 137 mmol/L (ref 135–146)

## 2018-02-11 LAB — CBC WITH DIFFERENTIAL/PLATELET
Basophils Absolute: 59 cells/uL (ref 0–200)
Basophils Relative: 0.8 %
EOS PCT: 0.4 %
Eosinophils Absolute: 30 cells/uL (ref 15–500)
HCT: 42.1 % (ref 38.5–50.0)
Hemoglobin: 14.7 g/dL (ref 13.2–17.1)
Lymphs Abs: 799 cells/uL — ABNORMAL LOW (ref 850–3900)
MCH: 30.6 pg (ref 27.0–33.0)
MCHC: 34.9 g/dL (ref 32.0–36.0)
MCV: 87.7 fL (ref 80.0–100.0)
MONOS PCT: 11.3 %
MPV: 9.8 fL (ref 7.5–12.5)
Neutro Abs: 5676 cells/uL (ref 1500–7800)
Neutrophils Relative %: 76.7 %
PLATELETS: 253 10*3/uL (ref 140–400)
RBC: 4.8 10*6/uL (ref 4.20–5.80)
RDW: 12.2 % (ref 11.0–15.0)
TOTAL LYMPHOCYTE: 10.8 %
WBC: 7.4 10*3/uL (ref 3.8–10.8)
WBCMIX: 836 {cells}/uL (ref 200–950)

## 2018-02-11 MED ORDER — PREDNISONE 20 MG PO TABS: ORAL_TABLET | ORAL | 0 refills | Status: DC

## 2018-02-11 NOTE — Progress Notes (Signed)
pre

## 2018-02-12 ENCOUNTER — Other Ambulatory Visit: Payer: Self-pay

## 2018-02-12 MED ORDER — OSELTAMIVIR PHOSPHATE 75 MG PO CAPS: 75.0000 mg | ORAL_CAPSULE | Freq: Two times a day (BID) | ORAL | 0 refills | Status: DC

## 2018-02-12 NOTE — Telephone Encounter (Signed)
Patient called to say that his wife was just diagnosed with the flu. Patient asked if it would be possible to have a prescription for tamiflu due to exposure to flu.   I spoke with Janett Billow by phone to request rx for tamiflu. Janett Billow verbally ordered tamiflu 75 mg BID for 5 days. Precautions given for when to seek emergency assistance. Patient verbalized understanding.

## 2018-04-23 ENCOUNTER — Telehealth: Payer: Self-pay | Admitting: *Deleted

## 2018-04-23 NOTE — Telephone Encounter (Signed)
Patient notified and will check with his insurance to see what is cheaper and call us back.

## 2018-04-23 NOTE — Telephone Encounter (Signed)
Patient called and stated that his insurance no longer covers the [facility] and it is going to cost him $50/month. He called the insurance and what they will cover is Fluticasone Prop. Patient would like the medication changed and called into his pharmacy. Please Advise.

## 2018-04-23 NOTE — Telephone Encounter (Signed)
This is not the same medication or equivalents, we can try advair, dulera or symbicort which are similar

## 2018-05-05 ENCOUNTER — Telehealth: Payer: Self-pay | Admitting: *Deleted

## 2018-05-05 NOTE — Telephone Encounter (Signed)
Received prior authorization form from [facility] for [employer]. Filled out and placed in jessica's folder to review and sign. To be faxed back

## 2018-05-07 ENCOUNTER — Encounter: Payer: Self-pay | Admitting: Nurse Practitioner

## 2018-05-10 DIAGNOSIS — M9903 Segmental and somatic dysfunction of lumbar region: Secondary | ICD-10-CM | POA: Diagnosis not present

## 2018-05-10 DIAGNOSIS — M545 Low back pain: Secondary | ICD-10-CM | POA: Diagnosis not present

## 2018-05-10 DIAGNOSIS — M9905 Segmental and somatic dysfunction of pelvic region: Secondary | ICD-10-CM | POA: Diagnosis not present

## 2018-05-10 NOTE — Telephone Encounter (Signed)
Received fax from [place] stating that the [employer] is not available per patient's plan benefit.  [id number] Patient ID: [id number]

## 2018-05-26 ENCOUNTER — Telehealth: Payer: Self-pay | Admitting: *Deleted

## 2018-05-26 DIAGNOSIS — J4540 Moderate persistent asthma, uncomplicated: Secondary | ICD-10-CM

## 2018-05-26 MED ORDER — FLUTICASONE FUROATE-VILANTEROL 200-25 MCG/INH IN AEPB: 1.0000 | INHALATION_SPRAY | Freq: Every day | RESPIRATORY_TRACT | 3 refills | Status: DC

## 2018-05-26 NOTE — Telephone Encounter (Signed)
Breo refill

## 2018-08-18 ENCOUNTER — Other Ambulatory Visit: Payer: Self-pay | Admitting: Nurse Practitioner

## 2018-08-18 DIAGNOSIS — I10 Essential (primary) hypertension: Secondary | ICD-10-CM

## 2018-08-26 ENCOUNTER — Other Ambulatory Visit: Payer: 59

## 2018-08-26 DIAGNOSIS — Z125 Encounter for screening for malignant neoplasm of prostate: Secondary | ICD-10-CM | POA: Diagnosis not present

## 2018-08-26 DIAGNOSIS — E782 Mixed hyperlipidemia: Secondary | ICD-10-CM | POA: Diagnosis not present

## 2018-08-26 DIAGNOSIS — I10 Essential (primary) hypertension: Secondary | ICD-10-CM | POA: Diagnosis not present

## 2018-08-27 DIAGNOSIS — H00024 Hordeolum internum left upper eyelid: Secondary | ICD-10-CM | POA: Diagnosis not present

## 2018-08-30 ENCOUNTER — Encounter: Payer: Self-pay | Admitting: Nurse Practitioner

## 2018-08-30 ENCOUNTER — Ambulatory Visit (INDEPENDENT_AMBULATORY_CARE_PROVIDER_SITE_OTHER): Payer: 59 | Admitting: Nurse Practitioner

## 2018-08-30 VITALS — BP 130/90 | HR 65 | Temp 97.6°F | Ht 74.0 in | Wt 234.8 lb

## 2018-08-30 DIAGNOSIS — I714 Abdominal aortic aneurysm, without rupture, unspecified: Secondary | ICD-10-CM

## 2018-08-30 DIAGNOSIS — Z0000 Encounter for general adult medical examination without abnormal findings: Secondary | ICD-10-CM

## 2018-08-30 DIAGNOSIS — J4540 Moderate persistent asthma, uncomplicated: Secondary | ICD-10-CM | POA: Diagnosis not present

## 2018-08-30 DIAGNOSIS — F329 Major depressive disorder, single episode, unspecified: Secondary | ICD-10-CM

## 2018-08-30 DIAGNOSIS — E782 Mixed hyperlipidemia: Secondary | ICD-10-CM | POA: Diagnosis not present

## 2018-08-30 DIAGNOSIS — I10 Essential (primary) hypertension: Secondary | ICD-10-CM

## 2018-08-30 DIAGNOSIS — E669 Obesity, unspecified: Secondary | ICD-10-CM

## 2018-08-30 DIAGNOSIS — Z6830 Body mass index (BMI) 30.0-30.9, adult: Secondary | ICD-10-CM

## 2018-08-30 DIAGNOSIS — Z125 Encounter for screening for malignant neoplasm of prostate: Secondary | ICD-10-CM

## 2018-08-30 MED ORDER — FLUTICASONE FUROATE-VILANTEROL 200-25 MCG/INH IN AEPB: 1.0000 | INHALATION_SPRAY | Freq: Every day | RESPIRATORY_TRACT | 3 refills | Status: DC

## 2018-08-30 MED ORDER — ALBUTEROL SULFATE HFA 108 (90 BASE) MCG/ACT IN AERS: 2.0000 | INHALATION_SPRAY | Freq: Four times a day (QID) | RESPIRATORY_TRACT | 3 refills | Status: DC | PRN

## 2018-08-30 NOTE — Progress Notes (Signed)
Careteam: [name], NP as PCP - General (Nurse Practitioner) [name], [name], NP as Nurse Practitioner (Vascular Surgery) [name], MD as Consulting Physician (General Surgery) Brand Males, MD as Consulting Physician (Pulmonary Disease) [name], MD as Consulting Physician (Cardiology) [name], PA-C as Physician Assistant (Dermatology)  Advanced Directive information    No Known Allergies  Chief Complaint  Patient presents with  . Medical Management of Chronic Issues    Physical ; Printed labs for review ; completed depression screening, EKG  . Immunizations    Patient is unable to receive flu vaccine today currently on antibiotic     HPI: Patient is a 62 y.o. male seen in the office today for annual exam.   HTN- controlled on losartan 50 mg daily   GERD- controlled on protonix   Hyperlipidemia- currently on simvastatin 20 mg daily, LDL at goal 83  COPD- continues on breo ellipta daily which is controlling symptoms.   Previously seen by Dr Einar Gip ~ 6 years ago when he had AAA repair.   AAA repair- following with vein and vascular yearly.   In feb 2018 had internal hemorrhoid repair, no issues since.   Has a stye on his eye and taking azithromycin for this. Using warm compresses and has been improving.   Would like something for weight loss but has not attempted to change diet or exercise.    Review of Systems:  Review of Systems  Constitutional: Negative for chills, fever and weight loss.  HENT: Negative for tinnitus.   Respiratory: Negative for cough, sputum production and shortness of breath.   Cardiovascular: Negative for chest pain, palpitations and leg swelling.  Gastrointestinal: Negative for abdominal pain, constipation, diarrhea and heartburn.  Genitourinary: Negative for dysuria, frequency and urgency.  Musculoskeletal: Negative for back pain, falls, joint pain and myalgias.  Skin: Negative.    Neurological: Negative for dizziness and headaches.  Psychiatric/Behavioral: Negative for depression and memory loss. The patient does not have insomnia.     Past Medical History:  Diagnosis Date  . Abdominal aortic aneurysm [facility])    sees Dr. Einar Gip for cardiac f/u, [phone]  . Arthritis    lower back  . Asthma    followed by Dr. Berdie Ogren  . Chronic airway obstruction, not elsewhere classified   . Dyspnea    normal PFT April 2012  . External hemorrhoids without mention of complication   . GERD (gastroesophageal reflux disease)   . Gout   . H/O hiatal hernia   . Hemorrhoids   . HTN (hypertension)    hx of sees Dr. Graylin Shiver  . Hyperlipidemia   . Insomnia, unspecified   . Other abnormal blood chemistry   . Other dyspnea and respiratory abnormality   . Other malaise and fatigue   . Other specified erythematous condition(695.89)   . Other testicular dysfunction   . Prostatitis, unspecified   . Rash 2011   left chest, biopsy 2012 Dr. Rozann Lesches, results pending  . Skin cancer   . Tobacco abuse    Past Surgical History:  Procedure Laterality Date  . ABDOMINAL AORTIC ENDOVASCULAR STENT GRAFT  09/22/2012   Procedure: ABDOMINAL AORTIC ENDOVASCULAR STENT GRAFT;  Surgeon: Mal Misty, MD;  Location: [place];  Service: Vascular;  Laterality: N/A;  GORE; ultrasound guided.  . APPENDECTOMY    . EVALUATION UNDER ANESTHESIA WITH HEMORRHOIDECTOMY N/A 12/31/2016   Procedure: EXAM UNDER ANESTHESIA WITH HEMORRHOIDECTOMY;  Surgeon: [name], MD;  Location: [facility]  [place];  Service: General;  Laterality: N/A;  . SKIN CANCER EXCISION    . TOE SURGERY  01/2012   joint of left great toe  . TONSILLECTOMY    . TONSILLECTOMY    . TYMPANOPLASTY    . WRIST SURGERY     left   Social History:   reports that he quit smoking about 5 years ago. His smoking use included cigarettes. He has a 10.50 pack-year smoking history. He has quit using smokeless tobacco.  His smokeless  tobacco use included snuff. He reports that he does not drink alcohol or use drugs.  Family History  Problem Relation Age of Onset  . Heart attack Father 2  . COPD Father   . Heart disease Father   . Asthma Daughter   . Cancer Mother        Breast  . Lupus Daughter   . Diabetes Other        Grandson     Medications: Patient's Medications  New Prescriptions   No medications on file  Previous Medications   ALBUTEROL (PROVENTIL HFA;VENTOLIN HFA) 108 (90 BASE) MCG/ACT INHALER    Inhale 2 puffs into the lungs every 6 (six) hours as needed. For cough and wheezing.   ASPIRIN EC 81 MG TABLET    Take 81 mg by mouth daily.   LOSARTAN (COZAAR) 50 MG TABLET    TAKE 1 TABLET BY MOUTH ONCE DAILY TO CONTROL BLOOD PRESSURE   PANTOPRAZOLE (PROTONIX) 40 MG TABLET    Take 1 tablet (40 mg total) by mouth daily.   SILDENAFIL (VIAGRA) 50 MG TABLET    Take 1 tablet (50 mg total) by mouth daily as needed for erectile dysfunction.   SIMVASTATIN (ZOCOR) 20 MG TABLET    Take 1 tablet (20 mg total) by mouth at bedtime.  Modified Medications   No medications on file  Discontinued Medications   CHLORPHENIRAMINE-HYDROCODONE (TUSSIONEX PENNKINETIC ER) 10-8 MG/5ML SUER    Take 5 mLs by mouth every 12 (twelve) hours as needed for cough.   DOXYCYCLINE (VIBRA-TABS) 100 MG TABLET    Take 1 tablet (100 mg total) by mouth 2 (two) times daily.   FLUTICASONE FUROATE-VILANTEROL (BREO ELLIPTA) 200-25 MCG/INH AEPB    Inhale 1 puff into the lungs daily.   HYDROCODONE-CHLORPHENIRAMINE 5-4 MG/5ML SOLN    Take 5 mLs by mouth.   OSELTAMIVIR (TAMIFLU) 75 MG CAPSULE    Take 1 capsule (75 mg total) by mouth 2 (two) times daily.   PREDNISONE (DELTASONE) 20 MG TABLET    To take 1 tablet daily with food for 3 days     Physical Exam:  Vitals:   08/30/18 1037  BP: 130/90  Pulse: 65  Temp: 97.6 F (36.4 C)  TempSrc: Oral  SpO2: 96%  Weight: 234 lb 12.8 oz (106.5 kg)  Height: 6\' 2"  (1.88 m)   Body mass index is 30.15  kg/m.  Physical Exam  Constitutional: He is oriented to person, place, and time. He appears well-developed and well-nourished. No distress.  HENT:  Head: Normocephalic and atraumatic.  Mouth/Throat: Oropharynx is clear and moist. No oropharyngeal exudate.  Eyes: Pupils are equal, round, and reactive to light. Conjunctivae and EOM are normal.  Neck: Normal range of motion. Neck supple.  Cardiovascular: Normal rate, regular rhythm and normal heart sounds.  Pulmonary/Chest: Effort normal and breath sounds normal.  Abdominal: Soft. Bowel sounds are normal.  Musculoskeletal: He exhibits no edema, tenderness or deformity.  Neurological: He is alert  and oriented to person, place, and time.  Skin: Skin is warm and dry. He is not diaphoretic.  Psychiatric: He has a normal mood and affect.    Labs reviewed: Basic Metabolic Panel: Recent Labs    12/25/17 0809 02/10/18 1608 08/26/18 0812  NA 142 137 139  K 4.7 4.0 4.2  CL 106 103 105  CO2 30 27 26   GLUCOSE 100* 112 100*  BUN 12 10 15   CREATININE 0.92 1.02 0.94  CALCIUM 9.6 8.8 9.3   Liver Function Tests: Recent Labs    10/31/17 2057 12/25/17 0809 08/26/18 0812  AST 29 17 25   ALT 32 22 27  ALKPHOS 100  --   --   BILITOT 0.5 0.4 0.6  PROT 7.7 6.8 6.6  ALBUMIN 4.4  --   --    Recent Labs    10/31/17 2057  LIPASE 23   No results for input(s): AMMONIA in the last 8760 hours. CBC: Recent Labs    12/25/17 0809 02/10/18 1608 08/26/18 0812  WBC 6.7 7.4 6.3  NEUTROABS 4,234 5,676 4,001  HGB 15.4 14.7 15.0  HCT 45.8 42.1 45.0  MCV 86.1 87.7 90.0  PLT 290 253 253   Lipid Panel: Recent Labs    12/25/17 0809 08/26/18 0812  CHOL 126 138  HDL 44 40*  LDLCALC 69 83  TRIG 53 66  CHOLHDL 2.9 3.5   TSH: No results for input(s): TSH in the last 8760 hours. A1C: Lab Results  Component Value Date   HGBA1C 6.0 (H) 08/09/2015     Assessment/Plan 1. AAA (abdominal aortic aneurysm) without rupture ([place]) Following  with vein and vascular yearly  2. Moderate persistent asthma without complication -controlled on medication.  - albuterol (PROVENTIL HFA;VENTOLIN HFA) 108 (90 Base) MCG/ACT inhaler; Inhale 2 puffs into the lungs every 6 (six) hours as needed. For cough and wheezing.  Dispense: 24 g; Refill: 3 - fluticasone furoate-vilanterol (BREO ELLIPTA) 200-25 MCG/INH AEPB; Inhale 1 puff into the lungs daily.  Dispense: 60 each; Refill: 3  3. Wellness examination -pt doing well. Discussed prevention of dental and periodontal disease, diet, regular sustained exercise for at least 30 minutes 5 times per week, tobacco use,  and recommended schedule for GI hemoccult testing, colonoscopy, cholesterol, thyroid and diabetes screening.  4. Mixed hyperlipidemia -LDL 84 continue on zocor with lifestyle modifications.   5. Prostate cancer screening -PSA   6. Body mass index (BMI) of 30.0-30.9 in adult BMI classified pt under obesity, discussed modifications of diet and increasing physical activity  7. Obesity (BMI 30-39.9) Discussed 30 mins of physical activity 5 days a week and modification of diet. Information provided for diet.   9. HTN Controlled on current regimen.  - EKG 12-Lead- remains stable  Next appt: 6 months for routine follow up.  Carlos American. [place], [facility] [phone]

## 2018-08-30 NOTE — Patient Instructions (Addendum)
Make appt for flu shot   Follow up in 6 months for routine follow up   Health Maintenance, Male A healthy lifestyle and preventive care is important for your health and wellness. Ask your health care provider about what schedule of regular examinations is right for you. What should I know about weight and diet? Eat a Healthy Diet  Eat plenty of vegetables, fruits, whole grains, low-fat dairy products, and lean protein.  Do not eat a lot of foods high in solid fats, added sugars, or salt.  Maintain a Healthy Weight Regular exercise can help you achieve or maintain a healthy weight. You should:  Do at least 150 minutes of exercise each week. The exercise should increase your heart rate and make you sweat (moderate-intensity exercise).  Do strength-training exercises at least twice a week.  Watch Your Levels of Cholesterol and Blood Lipids  Have your blood tested for lipids and cholesterol every 5 years starting at 60 years of age. If you are at high risk for heart disease, you should start having your blood tested when you are 60 years old. You may need to have your cholesterol levels checked more often if: ? Your lipid or cholesterol levels are high. ? You are older than 60 years of age. ? You are at high risk for heart disease.  What should I know about cancer screening? Many types of cancers can be detected early and may often be prevented. Lung Cancer  You should be screened every year for lung cancer if: ? You are a current smoker who has smoked for at least 30 years. ? You are a former smoker who has quit within the past 15 years.  Talk to your health care provider about your screening options, when you should start screening, and how often you should be screened.  Colorectal Cancer  Routine colorectal cancer screening usually begins at 60 years of age and should be repeated every 5-10 years until you are 60 years old. You may need to be screened more often if early forms  of precancerous polyps or small growths are found. Your health care provider may recommend screening at an earlier age if you have risk factors for colon cancer.  Your health care provider may recommend using home test kits to check for hidden blood in the stool.  A small camera at the end of a tube can be used to examine your colon (sigmoidoscopy or colonoscopy). This checks for the earliest forms of colorectal cancer.  Prostate and Testicular Cancer  Depending on your age and overall health, your health care provider may do certain tests to screen for prostate and testicular cancer.  Talk to your health care provider about any symptoms or concerns you have about testicular or prostate cancer.  Skin Cancer  Check your skin from head to toe regularly.  Tell your health care provider about any new moles or changes in moles, especially if: ? There is a change in a mole's size, shape, or color. ? You have a mole that is larger than a pencil eraser.  Always use sunscreen. Apply sunscreen liberally and repeat throughout the day.  Protect yourself by wearing long sleeves, pants, a wide-brimmed hat, and sunglasses when outside.  What should I know about heart disease, diabetes, and high blood pressure?  If you are 77-59 years of age, have your blood pressure checked every 3-5 years. If you are 31 years of age or older, have your blood pressure checked every year. You  should have your blood pressure measured twice-once when you are at a hospital or clinic, and once when you are not at a hospital or clinic. Record the average of the two measurements. To check your blood pressure when you are not at a hospital or clinic, you can use: ? An automated blood pressure machine at a pharmacy. ? A home blood pressure monitor.  Talk to your health care provider about your target blood pressure.  If you are between 86-35 years old, ask your health care provider if you should take aspirin to prevent heart  disease.  Have regular diabetes screenings by checking your fasting blood sugar level. ? If you are at a normal weight and have a low risk for diabetes, have this test once every three years after the age of 1. ? If you are overweight and have a high risk for diabetes, consider being tested at a younger age or more often.  A one-time screening for abdominal aortic aneurysm (AAA) by ultrasound is recommended for men aged 23-75 years who are current or former smokers. What should I know about preventing infection? Hepatitis B If you have a higher risk for hepatitis B, you should be screened for this virus. Talk with your health care provider to find out if you are at risk for hepatitis B infection. Hepatitis C Blood testing is recommended for:  Everyone born from [birth year] through 1963.  Anyone with known risk factors for hepatitis C.  Sexually Transmitted Diseases (STDs)  You should be screened each year for STDs including gonorrhea and chlamydia if: ? You are sexually active and are younger than 59 years of age. ? You are older than 60 years of age and your health care provider tells you that you are at risk for this type of infection. ? Your sexual activity has changed since you were last screened and you are at an increased risk for chlamydia or gonorrhea. Ask your health care provider if you are at risk.  Talk with your health care provider about whether you are at high risk of being infected with HIV. Your health care provider may recommend a prescription medicine to help prevent HIV infection.  What else can I do?  Schedule regular health, dental, and eye exams.  Stay current with your vaccines (immunizations).  Do not use any tobacco products, such as cigarettes, chewing tobacco, and e-cigarettes. If you need help quitting, ask your health care provider.  Limit alcohol intake to no more than 2 drinks per day. One drink equals 12 ounces of beer, 5 ounces of wine, or 1 ounces of  hard liquor.  Do not use street drugs.  Do not share needles.  Ask your health care provider for help if you need support or information about quitting drugs.  Tell your health care provider if you often feel depressed.  Tell your health care provider if you have ever been abused or do not feel safe at home. This information is not intended to replace advice given to you by your health care provider. Make sure you discuss any questions you have with your health care provider. Document Released: 05/08/2008 Document Revised: 07/09/2016 Document Reviewed: 08/14/2015 Elsevier Interactive Patient Education  2018 [place] [employer] Calories are units of energy. Your body needs a certain amount of calories from food to keep you going throughout the day. When you eat more calories than your body needs, your body stores the extra calories as fat. When  you eat fewer calories than your body needs, your body burns fat to get the energy it needs. Calorie counting means keeping track of how many calories you eat and drink each day. Calorie counting can be helpful if you need to lose weight. If you make sure to eat fewer calories than your body needs, you should lose weight. Ask your health care provider what a healthy weight is for you. For calorie counting to work, you will need to eat the right number of calories in a day in order to lose a healthy amount of weight per week. A dietitian can help you determine how many calories you need in a day and will give you suggestions on how to reach your calorie goal.  A healthy amount of weight to lose per week is usually 1-2 lb (0.5-0.9 kg). This usually means that your daily calorie intake should be reduced by 500-750 calories.  Eating 1,200 - 1,500 calories per day can help most women lose weight.  Eating 1,500 - 1,800 calories per day can help most men lose weight.  What is my plan? My goal is to have __________ calories per  day. If I have this many calories per day, I should lose around __________ pounds per week. What do I need to know about calorie counting? In order to meet your daily calorie goal, you will need to:  Find out how many calories are in each food you would like to eat. Try to do this before you eat.  Decide how much of the food you plan to eat.  Write down what you ate and how many calories it had. Doing this is called keeping a food log.  To successfully lose weight, it is important to balance calorie counting with a healthy lifestyle that includes regular activity. Aim for 150 minutes of moderate exercise (such as walking) or 75 minutes of vigorous exercise (such as running) each week. Where do I find calorie information?  The number of calories in a food can be found on a Nutrition Facts label. If a food does not have a Nutrition Facts label, try to look up the calories online or ask your dietitian for help. Remember that calories are listed per serving. If you choose to have more than one serving of a food, you will have to multiply the calories per serving by the amount of servings you plan to eat. For example, the label on a package of bread might say that a serving size is 1 slice and that there are 90 calories in a serving. If you eat 1 slice, you will have eaten 90 calories. If you eat 2 slices, you will have eaten 180 calories. How do I keep a food log? Immediately after each meal, record the following information in your food log:  What you ate. Don't forget to include toppings, sauces, and other extras on the food.  How much you ate. This can be measured in cups, ounces, or number of items.  How many calories each food and drink had.  The total number of calories in the meal.  Keep your food log near you, such as in a small notebook in your pocket, or use a mobile app or website. Some programs will calculate calories for you and show you how many calories you have left for the day  to meet your goal. What are some calorie counting tips?  Use your calories on foods and drinks that will fill you up and not leave  you hungry: ? Some examples of foods that fill you up are nuts and nut butters, vegetables, lean proteins, and high-fiber foods like whole grains. High-fiber foods are foods with more than 5 g fiber per serving. ? Drinks such as sodas, specialty coffee drinks, alcohol, and juices have a lot of calories, yet do not fill you up.  Eat nutritious foods and avoid empty calories. Empty calories are calories you get from foods or beverages that do not have many vitamins or protein, such as candy, sweets, and soda. It is better to have a nutritious high-calorie food (such as an avocado) than a food with few nutrients (such as a bag of chips).  Know how many calories are in the foods you eat most often. This will help you calculate calorie counts faster.  Pay attention to calories in drinks. Low-calorie drinks include water and unsweetened drinks.  Pay attention to nutrition labels for "low fat" or "fat free" foods. These foods sometimes have the same amount of calories or more calories than the full fat versions. They also often have added sugar, starch, or salt, to make up for flavor that was removed with the fat.  Find a way of tracking calories that works for you. Get creative. Try different apps or programs if writing down calories does not work for you. What are some portion control tips?  Know how many calories are in a serving. This will help you know how many servings of a certain food you can have.  Use a measuring cup to measure serving sizes. You could also try weighing out portions on a kitchen scale. With time, you will be able to estimate serving sizes for some foods.  Take some time to put servings of different foods on your favorite plates, bowls, and cups so you know what a serving looks like.  Try not to eat straight from a bag or box. Doing this can  lead to overeating. Put the amount you would like to eat in a cup or on a plate to make sure you are eating the right portion.  Use smaller plates, glasses, and bowls to prevent overeating.  Try not to multitask (for example, watch TV or use your computer) while eating. If it is time to eat, sit down at a table and enjoy your food. This will help you to know when you are full. It will also help you to be aware of what you are eating and how much you are eating. What are tips for following this plan? Reading food labels  Check the calorie count compared to the serving size. The serving size may be smaller than what you are used to eating.  Check the source of the calories. Make sure the food you are eating is high in vitamins and protein and low in saturated and trans fats. Shopping  Read nutrition labels while you shop. This will help you make healthy decisions before you decide to purchase your food.  Make a grocery list and stick to it. Cooking  Try to cook your favorite foods in a healthier way. For example, try baking instead of frying.  Use low-fat dairy products. Meal planning  Use more fruits and vegetables. Half of your plate should be fruits and vegetables.  Include lean proteins like poultry and fish. How do I count calories when eating out?  Ask for smaller portion sizes.  Consider sharing an entree and sides instead of getting your own entree.  If you get your own  entree, eat only half. Ask for a box at the beginning of your meal and put the rest of your entree in it so you are not tempted to eat it.  If calories are listed on the menu, choose the lower calorie options.  Choose dishes that include vegetables, fruits, whole grains, low-fat dairy products, and lean protein.  Choose items that are boiled, broiled, grilled, or steamed. Stay away from items that are buttered, battered, fried, or served with cream sauce. Items labeled "crispy" are usually fried, unless  stated otherwise.  Choose water, low-fat milk, unsweetened iced tea, or other drinks without added sugar. If you want an alcoholic beverage, choose a lower calorie option such as a glass of wine or light beer.  Ask for dressings, sauces, and syrups on the side. These are usually high in calories, so you should limit the amount you eat.  If you want a salad, choose a garden salad and ask for grilled meats. Avoid extra toppings like bacon, cheese, or fried items. Ask for the dressing on the side, or ask for olive oil and vinegar or lemon to use as dressing.  Estimate how many servings of a food you are given. For example, a serving of cooked rice is  cup or about the size of half a baseball. Knowing serving sizes will help you be aware of how much food you are eating at restaurants. The list below tells you how big or small some common portion sizes are based on everyday objects: ? 1 oz-4 stacked dice. ? 3 oz-1 deck of cards. ? 1 tsp-1 die. ? 1 Tbsp- a ping-pong ball. ? 2 Tbsp-1 ping-pong ball. ?  cup- baseball. ? 1 cup-1 baseball. Summary  Calorie counting means keeping track of how many calories you eat and drink each day. If you eat fewer calories than your body needs, you should lose weight.  A healthy amount of weight to lose per week is usually 1-2 lb (0.5-0.9 kg). This usually means reducing your daily calorie intake by 500-750 calories.  The number of calories in a food can be found on a Nutrition Facts label. If a food does not have a Nutrition Facts label, try to look up the calories online or ask your dietitian for help.  Use your calories on foods and drinks that will fill you up, and not on foods and drinks that will leave you hungry.  Use smaller plates, glasses, and bowls to prevent overeating. This information is not intended to replace advice given to you by your health care provider. Make sure you discuss any questions you have with your health care provider. Document  Released: 11/10/2005 Document Revised: 10/10/2016 Document Reviewed: 10/10/2016 Elsevier Interactive Patient Education  2018 [employer].   Fat and Cholesterol Restricted Diet Getting too much fat and cholesterol in your diet may cause health problems. Following this diet helps keep your fat and cholesterol at normal levels. This can keep you from getting sick. What types of fat should I choose?  Choose monosaturated and polyunsaturated fats. These are found in foods such as olive oil, canola oil, flaxseeds, walnuts, almonds, and seeds.  Eat more omega-3 fats. Good choices include salmon, mackerel, sardines, tuna, flaxseed oil, and ground flaxseeds.  Limit saturated fats. These are in animal products such as meats, butter, and cream. They can also be in plant products such as palm oil, palm kernel oil, and coconut oil.  Avoid foods with partially hydrogenated oils in them. These contain trans fats.  Examples of foods that have trans fats are stick margarine, some tub margarines, cookies, crackers, and other baked goods. What general guidelines do I need to follow?  Check food labels. Look for the words "trans fat" and "saturated fat."  When preparing a meal: ? Fill half of your plate with vegetables and green salads. ? Fill one fourth of your plate with whole grains. Look for the word "whole" as the first word in the ingredient list. ? Fill one fourth of your plate with lean protein foods.  Eat more foods that have fiber, like apples, carrots, beans, peas, and barley.  Eat more home-cooked foods. Eat less at restaurants and buffets.  Limit or avoid alcohol.  Limit foods high in starch and sugar.  Limit fried foods.  Cook foods without frying them. Baking, boiling, grilling, and broiling are all great options.  Lose weight if you are overweight. Losing even a small amount of weight can help your overall health. It can also help prevent diseases such as diabetes and heart  disease. What foods can I eat? Grains Whole grains, such as whole wheat or whole grain breads, crackers, cereals, and pasta. Unsweetened oatmeal, bulgur, barley, quinoa, or brown rice. Corn or whole wheat flour tortillas. Vegetables Fresh or frozen vegetables (raw, steamed, roasted, or grilled). Green salads. Fruits All fresh, canned (in natural juice), or frozen fruits. Meat and Other Protein Products Ground beef (85% or leaner), grass-fed beef, or beef trimmed of fat. Skinless chicken or [place]. Ground chicken or [place]. Pork trimmed of fat. All fish and seafood. Eggs. Dried beans, peas, or lentils. Unsalted nuts or seeds. Unsalted canned or dry beans. Dairy Low-fat dairy products, such as skim or 1% milk, 2% or reduced-fat cheeses, low-fat ricotta or cottage cheese, or plain low-fat yogurt. Fats and Oils Tub margarines without trans fats. Light or reduced-fat mayonnaise and salad dressings. Avocado. Olive, canola, sesame, or safflower oils. Natural peanut or almond butter (choose ones without added sugar and oil). The items listed above may not be a complete list of recommended foods or beverages. Contact your dietitian for more options. What foods are not recommended? Grains White bread. White pasta. White rice. Cornbread. Bagels, pastries, and croissants. Crackers that contain trans fat. Vegetables White potatoes. Corn. Creamed or fried vegetables. Vegetables in a cheese sauce. Fruits Dried fruits. Canned fruit in light or heavy syrup. Fruit juice. Meat and Other Protein Products Fatty cuts of meat. Ribs, chicken wings, bacon, sausage, bologna, salami, chitterlings, fatback, hot dogs, bratwurst, and packaged luncheon meats. Liver and organ meats. Dairy Whole or 2% milk, cream, half-and-half, and cream cheese. Whole milk cheeses. Whole-fat or sweetened yogurt. Full-fat cheeses. Nondairy creamers and whipped toppings. Processed cheese, cheese spreads, or cheese curds. Sweets and  Desserts Corn syrup, sugars, honey, and molasses. Candy. Jam and jelly. Syrup. Sweetened cereals. Cookies, pies, cakes, donuts, muffins, and ice cream. Fats and Oils Butter, stick margarine, lard, shortening, ghee, or bacon fat. Coconut, palm kernel, or palm oils. Beverages Alcohol. Sweetened drinks (such as sodas, lemonade, and fruit drinks or punches). The items listed above may not be a complete list of foods and beverages to avoid. Contact your dietitian for more information. This information is not intended to replace advice given to you by your health care provider. Make sure you discuss any questions you have with your health care provider. Document Released: 05/11/2012 Document Revised: 07/17/2016 Document Reviewed: 02/09/2014 Elsevier Interactive Patient Education  [employer].

## 2018-08-31 LAB — COMPLETE METABOLIC PANEL WITH GFR
AG RATIO: 1.9 (calc) (ref 1.0–2.5)
ALKALINE PHOSPHATASE (APISO): 80 U/L (ref 40–115)
ALT: 27 U/L (ref 9–46)
AST: 25 U/L (ref 10–35)
Albumin: 4.3 g/dL (ref 3.6–5.1)
BUN: 15 mg/dL (ref 7–25)
CO2: 26 mmol/L (ref 20–32)
CREATININE: 0.94 mg/dL (ref 0.70–1.33)
Calcium: 9.3 mg/dL (ref 8.6–10.3)
Chloride: 105 mmol/L (ref 98–110)
GFR, Est African American: 102 mL/min/{1.73_m2} (ref >=60)
GFR, Est Non African American: 88 mL/min/{1.73_m2} (ref >=60)
GLOBULIN: 2.3 g/dL (ref 1.9–3.7)
Glucose, Bld: 100 mg/dL — ABNORMAL HIGH (ref 65–99)
POTASSIUM: 4.2 mmol/L (ref 3.5–5.3)
SODIUM: 139 mmol/L (ref 135–146)
Total Bilirubin: 0.6 mg/dL (ref 0.2–1.2)
Total Protein: 6.6 g/dL (ref 6.1–8.1)

## 2018-08-31 LAB — CBC WITH DIFFERENTIAL/PLATELET
BASOS ABS: 69 {cells}/uL (ref 0–200)
Basophils Relative: 1.1 %
EOS ABS: 132 {cells}/uL (ref 15–500)
Eosinophils Relative: 2.1 %
HEMATOCRIT: 45 % (ref 38.5–50.0)
HEMOGLOBIN: 15 g/dL (ref 13.2–17.1)
Lymphs Abs: 1531 cells/uL (ref 850–3900)
MCH: 30 pg (ref 27.0–33.0)
MCHC: 33.3 g/dL (ref 32.0–36.0)
MCV: 90 fL (ref 80.0–100.0)
MONOS PCT: 9 %
MPV: 9.9 fL (ref 7.5–12.5)
NEUTROS ABS: 4001 {cells}/uL (ref 1500–7800)
NEUTROS PCT: 63.5 %
Platelets: 253 10*3/uL (ref 140–400)
RBC: 5 10*6/uL (ref 4.20–5.80)
RDW: 12.5 % (ref 11.0–15.0)
Total Lymphocyte: 24.3 %
WBC mixed population: 567 cells/uL (ref 200–950)
WBC: 6.3 10*3/uL (ref 3.8–10.8)

## 2018-08-31 LAB — TEST AUTHORIZATION

## 2018-08-31 LAB — LIPID PANEL
CHOLESTEROL: 138 mg/dL (ref <200)
HDL: 40 mg/dL — AB (ref >40)
LDL Cholesterol (Calc): 83 mg/dL (calc)
Non-HDL Cholesterol (Calc): 98 mg/dL (calc) (ref <130)
Total CHOL/HDL Ratio: 3.5 (calc) (ref <5.0)
Triglycerides: 66 mg/dL (ref <150)

## 2018-08-31 LAB — PSA: PSA: 0.5 ng/mL (ref <=4.0)

## 2018-11-19 ENCOUNTER — Encounter: Payer: Self-pay | Admitting: Physician Assistant

## 2018-11-19 ENCOUNTER — Ambulatory Visit: Admission: RE | Admit: 2018-11-19 | Discharge status: Absent without leave | Payer: 59

## 2018-11-19 ENCOUNTER — Ambulatory Visit: Payer: Self-pay | Admitting: Physician Assistant

## 2018-11-19 VITALS — BP 140/80 | HR 97 | Temp 98.5°F | Resp 18 | Ht 72.0 in | Wt 162.8 lb

## 2018-11-19 DIAGNOSIS — J209 Acute bronchitis, unspecified: Secondary | ICD-10-CM

## 2018-11-19 DIAGNOSIS — J440 Chronic obstructive pulmonary disease with acute lower respiratory infection: Secondary | ICD-10-CM

## 2018-11-19 MED ORDER — ALBUTEROL SULFATE (2.5 MG/3ML) 0.083% IN NEBU: 2.5000 mg | INHALATION_SOLUTION | Freq: Four times a day (QID) | RESPIRATORY_TRACT | 1 refills | Status: DC | PRN

## 2018-11-19 MED ORDER — DOXYCYCLINE HYCLATE 100 MG PO TABS: 100.0000 mg | ORAL_TABLET | Freq: Two times a day (BID) | ORAL | 0 refills | Status: DC

## 2018-11-19 MED ORDER — IPRATROPIUM BROMIDE 0.02 % IN SOLN
0.5000 mg | Freq: Once | RESPIRATORY_TRACT | Status: AC
  Administered 2018-11-19: 0.5 mg via RESPIRATORY_TRACT

## 2018-11-19 MED ORDER — PREDNISONE 20 MG PO TABS: 40.0000 mg | ORAL_TABLET | Freq: Every day | ORAL | 0 refills | Status: AC

## 2018-11-19 MED ORDER — IPRATROPIUM BROMIDE 0.02 % IN SOLN: 0.5000 mg | Freq: Four times a day (QID) | RESPIRATORY_TRACT | 12 refills | Status: DC

## 2018-11-19 MED ORDER — ALBUTEROL SULFATE (2.5 MG/3ML) 0.083% IN NEBU
2.5000 mg | INHALATION_SOLUTION | Freq: Once | RESPIRATORY_TRACT | Status: AC
  Administered 2018-11-19: 2.5 mg via RESPIRATORY_TRACT

## 2018-11-19 NOTE — Progress Notes (Addendum)
MRN: [id number] DOB: 1956-03-17  Subjective:   Aaron [name] is a 62 y.o. male presenting for chief complaint of cough. Reports 3 day history of worsening dry hacking cough. Starting to feel some chest tightness and wheezing. Has associated nasal congestion. Not sure if he is having body aches? Denies fever, SOB, DOE, chest pain, productive cough, hemoptysis, sinus pain, ear pain, sore throat, N/V/D.  Has tried tussionex and tessalon perles with no full relief. Has had to increase use of his rescue inhaler over the past two days. No known sick contact exposure. Has PMH of COPD vs asthma?-takes breo ellipta daily, has albuterol inhaler prn. Notes they thought he had COPD but once he saw pulmonologist and had testing it was dx as asthma.  Chest CT from 2018 showed emphysema. Has not had flu shot this season. Former smoker, quit 2013.  Denies any other aggravating or relieving factors, no other questions or concerns.  Review of Systems  Constitutional: Negative for diaphoresis.  Gastrointestinal: Negative for abdominal pain.  Musculoskeletal: Negative for neck pain.  Skin: Negative for rash.  Neurological: Negative for dizziness and weakness.    [name] has a current medication list which includes the following prescription(s): albuterol, aspirin ec, fluticasone furoate-vilanterol, losartan, pantoprazole, sildenafil, simvastatin, albuterol, doxycycline, ipratropium, and prednisone. Also has No Known Allergies.  [name]  has a past medical history of Abdominal aortic aneurysm ([place]), Arthritis, Asthma, Chronic airway obstruction, not elsewhere classified, Dyspnea, External hemorrhoids without mention of complication, GERD (gastroesophageal reflux disease), Gout, H/O hiatal hernia, Hemorrhoids, HTN (hypertension), Hyperlipidemia, Insomnia, unspecified, Other abnormal blood chemistry, Other dyspnea and respiratory abnormality, Other malaise and fatigue, Other specified erythematous condition(695.89), Other  testicular dysfunction, Prostatitis, unspecified, Rash (2011), Skin cancer, and Tobacco abuse. Also  has a past surgical history that includes Appendectomy; Tonsillectomy; Wrist surgery; Toe Surgery (01/2012); Tympanoplasty; Abdominal aortic endovascular stent graft (09/22/2012); Skin cancer excision; Tonsillectomy; and Exam under anesthesia with hemorrhoidectomy (N/A, 12/31/2016).   Objective:   Vitals: BP 140/80 (BP Location: Right Arm, Patient Position: Sitting)   Pulse 97   Temp 98.5 F (36.9 C) (Oral)   Resp 18   Ht 6' (1.829 m)   Wt 162 lb 12.8 oz (73.8 kg)   SpO2 97%   BMI 22.08 kg/m   Physical Exam Vitals signs reviewed.  Constitutional:      General: He is not in acute distress.    Appearance: Normal appearance. He is well-developed. He is not ill-appearing, toxic-appearing or diaphoretic.  HENT:     Head: Normocephalic and atraumatic.     Right Ear: Tympanic membrane, ear canal and external ear normal.     Left Ear: Tympanic membrane, ear canal and external ear normal.     Nose: Congestion present.     Right Sinus: No maxillary sinus tenderness or frontal sinus tenderness.     Left Sinus: No maxillary sinus tenderness or frontal sinus tenderness.     Mouth/Throat:     Pharynx: Oropharynx is clear. Uvula midline. No posterior oropharyngeal erythema.     Tonsils: No tonsillar exudate or tonsillar abscesses.  Eyes:     Conjunctiva/sclera: Conjunctivae normal.  Neck:     Musculoskeletal: Normal range of motion.  Cardiovascular:     Rate and Rhythm: Normal rate and regular rhythm.     Heart sounds: Normal heart sounds.  Pulmonary:     Effort: Pulmonary effort is normal. No tachypnea, accessory muscle usage or respiratory distress.     Breath sounds: Wheezing (few wheezes auscultated in  b/l posterior lung fields with forced expiratory breaths) present. No decreased breath sounds, rhonchi or rales.  Lymphadenopathy:     Head:     Right side of head: No submental,  submandibular, tonsillar, preauricular, posterior auricular or occipital adenopathy.     Left side of head: No submental, submandibular, tonsillar, preauricular, posterior auricular or occipital adenopathy.     Cervical: No cervical adenopathy.     Upper Body:     Right upper body: No supraclavicular adenopathy.     Left upper body: No supraclavicular adenopathy.  Skin:    General: Skin is warm and dry.  Neurological:     Mental Status: He is alert and oriented to person, place, and time.     No results found for this or any previous visit (from the past 24 hour(s)).   Breathing treatment of duoneb given in office. Patient reports chest tightness resolved. Lungs CTAB, wheezing resolved. Peak flow post breathing tx is 345, ~59% of age predicted.  SpO2 increased from 95% pre breathing treatment to 97% post breathing treatment.    Assessment and Plan :  1. Acute bronchitis with COPD ([place]) Pt is overall well appearing, NAD. VSS. Few scant wheezes auscultated with forced expiratory breathing completely resolved with duoneb. SpO2 increased to 97%, which is above his baseline of 96%. Concern for early COPD exacerbation given hx. Will treat with oral pred, oral abx, and albuterol inhaler every 4-6 hrs prn at this time with recommended close f/u with family doctor.  Last EKG on 08/30/18 with nonspecific QRS widening and QT of 446 msec, will therefore use doxycycline as opposed to azithromycin.  Exam findings, diagnosis etiology, medication use, indications, side effects, risks, benefits, and alternatives of the medications and treatment plan prescribed today and reviewed with patient. Follow- Up and discharge instructions provided. No emergent/urgent issues found on exam. Patient education was provided. Patient verbalized understanding of information provided and agrees with plan of care (POC), all questions answered. No barriers to understanding were identified. Red flags discussed in detail. The patient  is advised to f/u with PCP if he does not see an improvement in symptoms, or to seek the care of the closest emergency department if condition worsens with the above plan.  - predniSONE (DELTASONE) 20 MG tablet; Take 2 tablets (40 mg total) by mouth daily with breakfast for 5 days.  Dispense: 10 tablet; Refill: 0 - doxycycline (VIBRA-TABS) 100 MG tablet; Take 1 tablet (100 mg total) by mouth 2 (two) times daily.  Dispense: 20 tablet; Refill: [place] 11/19/2018 7:04 PM

## 2018-11-19 NOTE — Patient Instructions (Addendum)
Chronic Obstructive Pulmonary Disease Exacerbation  We are going to treat your underlying inflammation with oral prednisone and oral antibiotics. Prednisone is a steroid and can cause side effects such as headache, irritability, nausea, vomiting, increased heart rate, increased blood pressure, increased blood sugar, appetite changes, and insomnia. Please take tablets in the morning with a full meal to help decrease the chances of these side effects.   Use albuterol inhaler every 4-6 hours as needed for wheezing.   Can continue tessalon perles as needed for cough suppression.    Follow up with family doctor in 41 hours. Seek care immediately at ED if symptoms worsen/develop new concerning symptoms.    Chronic obstructive pulmonary disease (COPD) is a long-term (chronic) lung problem. In COPD, the flow of air from the lungs is limited. COPD exacerbations are times that breathing gets worse and you need more than your normal treatment. Without treatment, they can be life threatening. If they happen often, your lungs can become more damaged. If your COPD gets worse, your doctor may treat you with:  Medicines.  Oxygen.  Different ways to clear your airway, such as using a mask. Follow these instructions at home: Medicines  Take over-the-counter and prescription medicines only as told by your doctor.  If you take an antibiotic or steroid medicine, do not stop taking the medicine even if you start to feel better.  Keep up with shots (vaccinations) as told by your doctor. Be sure to get a yearly (annual) flu shot. Lifestyle  Do not smoke. If you need help quitting, ask your doctor.  Eat healthy foods.  Exercise regularly.  Get plenty of sleep.  Avoid tobacco smoke and other things that can bother your lungs.  Wash your hands often with soap and water. This will help keep you from getting an infection. If you cannot use soap and water, use hand sanitizer.  During flu season, avoid  areas that are crowded with people. General instructions  Drink enough fluid to keep your pee (urine) clear or pale yellow. Do not do this if your doctor has told you not to.  Use a cool mist machine (vaporizer).  If you use oxygen or a machine that turns medicine into a mist (nebulizer), continue to use it as told.  Follow all instructions for rehabilitation. These are steps you can take to make your body work better.  Keep all follow-up visits as told by your doctor. This is important. Contact a doctor if:  Your COPD symptoms get worse than normal. Get help right away if:  You are short of breath and it gets worse.  You have trouble talking.  You have chest pain.  You cough up blood.  You have a fever.  You keep throwing up (vomiting).  You feel weak or you pass out (faint).  You feel confused.  You are not able to sleep because of your symptoms.  You are not able to do daily activities. Summary  COPD exacerbations are times that breathing gets worse and you need more treatment than normal.  COPD exacerbations can be very serious and may cause your lungs to become more damaged.  Do not smoke. If you need help quitting, ask your doctor.  Stay up-to-date on your shots. Get a flu shot every year. This information is not intended to replace advice given to you by your health care provider. Make sure you discuss any questions you have with your health care provider. Document Released: 10/30/2011 Document Revised: 12/15/2016 Document Reviewed:  12/15/2016 Elsevier Interactive Patient Education  [employer].

## 2018-11-19 NOTE — Addendum Note (Signed)
Addended by: Tenna Delaine D on: 11/19/2018 08:04 PM   Modules accepted: Orders

## 2018-11-22 ENCOUNTER — Telehealth: Payer: Self-pay | Admitting: Physician Assistant

## 2018-11-22 NOTE — Telephone Encounter (Signed)
Contacted pharmacy. Left voicemail for pharmacist. Please cancel the Rx (and remaining refills) for albuterol and atrovent nebulizer solution. This was sent in error. Meant to prescribe the "in house" duoneb nebulizer medications as he was given duoneb in office.

## 2018-11-23 ENCOUNTER — Telehealth: Payer: Self-pay

## 2018-11-23 NOTE — Telephone Encounter (Signed)
He can use mucinex DM by mouth twice daily with full glass of water for cough, appears nebulizer was sent in error, this is generally not covered for short term use.  To continue prednisone and antibiotic as directed by PA If he needs additional medications due to ongoing symptoms would recommend follow up in our office.  Thank you

## 2018-11-23 NOTE — Telephone Encounter (Signed)
Patient was seen at [facility] on 11/19/18 for bronchitis. Patient states he was given rx for albuterol solution yet no nebulizer was prescribed. Patient used nebulizer medication with his grandson's machine. Patient is requesting a rx for nebulizer.  Patient was informed per phone note from yesterday that Brittany-PA seen on 11/19/18 called the pharmacy to try and cancel rx for albuterol solution for they sent it to pharmacy in error.   Patient then proceeded to say he is still with cough and needs a cough medication sent to pharmacy. I offered appointment today with [facility] and patient refused for he is working and unable to get off.    Please advise

## 2018-11-23 NOTE — Telephone Encounter (Signed)
Patient aware of Jessica's response and verbalized understanding. Scheduled appointment for Thursday 11/25/2018

## 2018-11-25 ENCOUNTER — Telehealth: Payer: Self-pay | Admitting: *Deleted

## 2018-11-25 ENCOUNTER — Encounter: Payer: Self-pay | Admitting: Nurse Practitioner

## 2018-11-25 ENCOUNTER — Ambulatory Visit (INDEPENDENT_AMBULATORY_CARE_PROVIDER_SITE_OTHER): Payer: 59 | Admitting: Nurse Practitioner

## 2018-11-25 ENCOUNTER — Ambulatory Visit: Payer: Self-pay | Admitting: Nurse Practitioner

## 2018-11-25 VITALS — BP 118/80 | HR 62 | Temp 98.1°F | Ht 72.0 in | Wt 232.4 lb

## 2018-11-25 DIAGNOSIS — Z87891 Personal history of nicotine dependence: Secondary | ICD-10-CM

## 2018-11-25 DIAGNOSIS — J209 Acute bronchitis, unspecified: Secondary | ICD-10-CM | POA: Diagnosis not present

## 2018-11-25 DIAGNOSIS — J4540 Moderate persistent asthma, uncomplicated: Secondary | ICD-10-CM

## 2018-11-25 NOTE — Progress Notes (Addendum)
Careteam: [name], NP as PCP - General (Nurse Practitioner) [name], [name], NP as Nurse Practitioner (Vascular Surgery) [name], MD as Consulting Physician (General Surgery) Brand Males, MD as Consulting Physician (Pulmonary Disease) [name], MD as Consulting Physician (Cardiology) [name], PA-C as Physician Assistant (Dermatology)  Advanced Directive information    No Known Allergies  Chief Complaint  Patient presents with  . Follow-up    Follow from [facility] visit on Bronchitis/ongoing cough; no fever, on antibiotics, still has cough     HPI: Patient is a 63 y.o. male seen in the office today to follow up bronchitis. He was seen at [facility] (could not file insurance) on 12/27 and was prescribed doxycyline, prednisone and albuterol neb. He did not have nebulizer so he borrowed one.  Yesterday was a good day, feeling sluggish today but "just woke up" Using tessalon pearls but does not help much. Cough has improved but still there.  Minimal congestion. Using mucinex DM occasional.   Review of Systems:  Review of Systems  Constitutional: Negative for chills, fever and malaise/fatigue.  HENT: Negative for congestion, ear discharge, ear pain, sinus pain and sore throat.   Respiratory: Positive for cough and sputum production. Negative for shortness of breath and wheezing.        Improved   Cardiovascular: Negative for chest pain and palpitations.  Neurological: Negative for dizziness, weakness and headaches.    Past Medical History:  Diagnosis Date  . Abdominal aortic aneurysm [facility])    sees Dr. Einar Gip for cardiac f/u, [phone]  . Arthritis    lower back  . Asthma    followed by Dr. Berdie Ogren  . Chronic airway obstruction, not elsewhere classified   . Dyspnea    normal PFT April 2012  . External hemorrhoids without mention of complication   . GERD (gastroesophageal reflux disease)   . Gout     . H/O hiatal hernia   . Hemorrhoids   . HTN (hypertension)    hx of sees Dr. Graylin Shiver  . Hyperlipidemia   . Insomnia, unspecified   . Other abnormal blood chemistry   . Other dyspnea and respiratory abnormality   . Other malaise and fatigue   . Other specified erythematous condition(695.89)   . Other testicular dysfunction   . Prostatitis, unspecified   . Rash 2011   left chest, biopsy 2012 Dr. Rozann Lesches, results pending  . Skin cancer   . Tobacco abuse    Past Surgical History:  Procedure Laterality Date  . ABDOMINAL AORTIC ENDOVASCULAR STENT GRAFT  09/22/2012   Procedure: ABDOMINAL AORTIC ENDOVASCULAR STENT GRAFT;  Surgeon: Mal Misty, MD;  Location: [place];  Service: Vascular;  Laterality: N/A;  GORE; ultrasound guided.  . APPENDECTOMY    . EVALUATION UNDER ANESTHESIA WITH HEMORRHOIDECTOMY N/A 12/31/2016   Procedure: EXAM UNDER ANESTHESIA WITH HEMORRHOIDECTOMY;  Surgeon: [name], MD;  Location: [place];  Service: General;  Laterality: N/A;  . SKIN CANCER EXCISION    . TOE SURGERY  01/2012   joint of left great toe  . TONSILLECTOMY    . TONSILLECTOMY    . TYMPANOPLASTY    . WRIST SURGERY     left   Social History:   reports that he quit smoking about 6 years ago. His smoking use included cigarettes. He has a 9.50 pack-year smoking history. He has quit using smokeless tobacco.  His smokeless tobacco use included snuff. He reports that he does  not drink alcohol or use drugs.  He smoked for 38 years 0.25 packs a day, starting at age 52. Quit 6 years ago.  - Family History  Problem Relation Age of Onset  . Heart attack Father 26  . COPD Father   . Heart disease Father   . Asthma Daughter   . Cancer Mother        Breast  . Lupus Daughter   . Diabetes Other        Grandson     Medications: Patient's Medications  New Prescriptions   No medications on file  Previous Medications   ALBUTEROL (PROVENTIL HFA;VENTOLIN HFA) 108 (90 BASE) MCG/ACT  INHALER    Inhale 2 puffs into the lungs every 6 (six) hours as needed. For cough and wheezing.   ASPIRIN EC 81 MG TABLET    Take 81 mg by mouth daily.   DOXYCYCLINE (VIBRA-TABS) 100 MG TABLET    Take 1 tablet (100 mg total) by mouth 2 (two) times daily.   FLUTICASONE FUROATE-VILANTEROL (BREO ELLIPTA) 200-25 MCG/INH AEPB    Inhale 1 puff into the lungs daily.   LOSARTAN (COZAAR) 50 MG TABLET    TAKE 1 TABLET BY MOUTH ONCE DAILY TO CONTROL BLOOD PRESSURE   PANTOPRAZOLE (PROTONIX) 40 MG TABLET    Take 1 tablet (40 mg total) by mouth daily.   SILDENAFIL (VIAGRA) 50 MG TABLET    Take 1 tablet (50 mg total) by mouth daily as needed for erectile dysfunction.   SIMVASTATIN (ZOCOR) 20 MG TABLET    Take 1 tablet (20 mg total) by mouth at bedtime.  Modified Medications   No medications on file  Discontinued Medications   No medications on file     Physical Exam:  Vitals:   11/25/18 0850  BP: 118/80  Pulse: 62  Temp: 98.1 F (36.7 C)  TempSrc: Oral  SpO2: 95%  Weight: 232 lb 6.4 oz (105.4 kg)  Height: 6' (1.829 m)   Body mass index is 31.52 kg/m.  Physical Exam Constitutional:      Appearance: He is well-developed.  HENT:     Head: Normocephalic and atraumatic.     Right Ear: External ear normal.     Left Ear: External ear normal.     Nose: Mucosal edema and rhinorrhea present.     Mouth/Throat:     Pharynx: No oropharyngeal exudate.  Cardiovascular:     Rate and Rhythm: Normal rate and regular rhythm.     Heart sounds: Normal heart sounds.  Pulmonary:     Effort: Pulmonary effort is normal.     Breath sounds: Normal breath sounds. No wheezing.  Neurological:     Mental Status: He is alert and oriented to person, place, and time.     Labs reviewed: Basic Metabolic Panel: Recent Labs    12/25/17 0809 02/10/18 1608 08/26/18 0812  NA 142 137 139  K 4.7 4.0 4.2  CL 106 103 105  CO2 30 27 26   GLUCOSE 100* 112 100*  BUN 12 10 15   CREATININE 0.92 1.02 0.94  CALCIUM 9.6  8.8 9.3   Liver Function Tests: Recent Labs    12/25/17 0809 08/26/18 0812  AST 17 25  ALT 22 27  BILITOT 0.4 0.6  PROT 6.8 6.6   No results for input(s): LIPASE, AMYLASE in the last 8760 hours. No results for input(s): AMMONIA in the last 8760 hours. CBC: Recent Labs    12/25/17 0809 02/10/18 1608 08/26/18 0812  WBC  6.7 7.4 6.3  NEUTROABS 4,234 5,676 4,001  HGB 15.4 14.7 15.0  HCT 45.8 42.1 45.0  MCV 86.1 87.7 90.0  PLT 290 253 253   Lipid Panel: Recent Labs    12/25/17 0809 08/26/18 0812  CHOL 126 138  HDL 44 40*  LDLCALC 69 83  TRIG 53 66  CHOLHDL 2.9 3.5   TSH: No results for input(s): TSH in the last 8760 hours. A1C: Lab Results  Component Value Date   HGBA1C 6.0 (H) 08/09/2015     Assessment/Plan 1.  Hx of Tobacco use -hx of smoking, qualifies for CT chest  He had CT chest/lung on 06/2017 and recommended 1 year follow up.  - CT CHEST LUNG CA SCREEN LOW DOSE W/O CM; Future  2. Moderate persistent asthma without complication -overall asthma has been well controlled and stable on breo.  3.  Acute bronchitis, unspecified organism -improving. completed prednisone, continues doxycyline until complete. To use albuterol as needed cough/wheezing at this time. May use mucinex DM by mouth twice by mouth with full glass of water routinely for next 7 days. Follow up if symptoms worsen.   Next appt: 03/02/2019 Aaron [name]. [place], [facility] [phone]

## 2018-11-25 NOTE — Telephone Encounter (Signed)
We did talk about this, he does not need the nebulizer machine, to use the albuterol inhaler vs the nebulizer

## 2018-11-25 NOTE — Telephone Encounter (Signed)
Patient called and stated that he was just seen this morning and he forgot to ask Janett Billow to write him a Rx for a [employer]. Stated that he already has the medication but needs the Nebulizer. Please Advise.

## 2018-11-25 NOTE — Patient Instructions (Signed)
Continue mucinex DM by mouth twice daily twice daily with full glass of water  To use albuterol as needed for cough/wheezing.

## 2018-11-26 NOTE — Telephone Encounter (Signed)
Patient notified and agreed.  

## 2018-12-03 ENCOUNTER — Ambulatory Visit: Payer: 59

## 2018-12-10 ENCOUNTER — Inpatient Hospital Stay: Admission: RE | Admit: 2018-12-10 | Payer: 59 | Source: Ambulatory Visit

## 2018-12-10 ENCOUNTER — Encounter: Payer: Self-pay | Admitting: Nurse Practitioner

## 2018-12-10 ENCOUNTER — Ambulatory Visit: Payer: 59

## 2018-12-18 ENCOUNTER — Other Ambulatory Visit: Payer: Self-pay | Admitting: Nurse Practitioner

## 2018-12-18 DIAGNOSIS — K219 Gastro-esophageal reflux disease without esophagitis: Secondary | ICD-10-CM

## 2018-12-21 ENCOUNTER — Ambulatory Visit: Payer: 59 | Admitting: Family

## 2018-12-21 ENCOUNTER — Other Ambulatory Visit (HOSPITAL_COMMUNITY): Payer: 59

## 2018-12-27 ENCOUNTER — Other Ambulatory Visit: Payer: Self-pay

## 2018-12-30 NOTE — Progress Notes (Signed)
[facility] [place] HISTORY AND PHYSICAL   History of Present Illness:  Patient is a 63 y.o. year old male who presents for evaluation of abdominal aortic aneurysm 5.1 cm prior to EVAR.  He is s/p EVAR repair by Dr. Tinnie Gens on 09/22/12.  He denies abdominal pain or lumbar pain.  He denies symptoms of claudication and rest pain in his lower extremities.  He is here for annual follow and repeat AAA ultrasound.  His last Ultrasound showed the AAA measured 4.2 cm in diameter.  He takes aspirin and Zocor daily.  He has a history of tobacco use, but quit in 2013.    Past Medical History:  Diagnosis Date  . Abdominal aortic aneurysm [facility] [place])    sees Dr. Einar Gip for cardiac f/u, [phone]  . Arthritis    lower back  . Asthma    followed by Dr. Berdie Ogren  . Chronic airway obstruction, not elsewhere classified   . Dyspnea    normal PFT April 2012  . External hemorrhoids without mention of complication   . GERD (gastroesophageal reflux disease)   . Gout   . H/O hiatal hernia   . Hemorrhoids   . HTN (hypertension)    hx of sees Dr. Graylin Shiver  . Hyperlipidemia   . Insomnia, unspecified   . Other abnormal blood chemistry   . Other dyspnea and respiratory abnormality   . Other malaise and fatigue   . Other specified erythematous condition(695.89)   . Other testicular dysfunction   . Prostatitis, unspecified   . Rash 2011   left chest, biopsy 2012 Dr. Rozann Lesches, results pending  . Skin cancer   . Tobacco abuse     Past Surgical History:  Procedure Laterality Date  . ABDOMINAL AORTIC ENDOVASCULAR STENT GRAFT  09/22/2012   Procedure: ABDOMINAL AORTIC ENDOVASCULAR STENT GRAFT;  Surgeon: Mal Misty, MD;  Location: [place];  Service: Vascular;  Laterality: N/A;  GORE; ultrasound guided.  . APPENDECTOMY    . EVALUATION UNDER ANESTHESIA WITH HEMORRHOIDECTOMY N/A 12/31/2016   Procedure: EXAM UNDER ANESTHESIA WITH HEMORRHOIDECTOMY;  Surgeon: [name], MD;   Location: [place];  Service: General;  Laterality: N/A;  . SKIN CANCER EXCISION    . TOE SURGERY  01/2012   joint of left great toe  . TONSILLECTOMY    . TONSILLECTOMY    . TYMPANOPLASTY    . WRIST SURGERY     left     Social History Social History   Tobacco Use  . Smoking status: Former Smoker    Packs/day: 0.25    Years: 38.00    Pack years: 9.50    Types: Cigarettes    Last attempt to quit: 09/02/2012    Years since quitting: 6.3  . Smokeless tobacco: Former Systems developer    Types: Snuff  Substance Use Topics  . Alcohol use: No    Alcohol/week: 0.0 standard drinks    Comment: past ETOH use quit 11-26-2009  . Drug use: No    Family History Family History  Problem Relation Age of Onset  . Heart attack Father 75  . COPD Father   . Heart disease Father   . Asthma Daughter   . Cancer Mother        Breast  . Lupus Daughter   . Diabetes Other        Grandson     Allergies  No Known Allergies   Current Outpatient Medications  Medication Sig Dispense Refill  .  albuterol (PROVENTIL HFA;VENTOLIN HFA) 108 (90 Base) MCG/ACT inhaler Inhale 2 puffs into the lungs every 6 (six) hours as needed. For cough and wheezing. 24 g 3  . aspirin EC 81 MG tablet Take 81 mg by mouth daily.    Marland Kitchen doxycycline (VIBRA-TABS) 100 MG tablet Take 1 tablet (100 mg total) by mouth 2 (two) times daily. 20 tablet 0  . fluticasone furoate-vilanterol (BREO ELLIPTA) 200-25 MCG/INH AEPB Inhale 1 puff into the lungs daily. 60 each 3  . losartan (COZAAR) 50 MG tablet TAKE 1 TABLET BY MOUTH ONCE DAILY TO CONTROL BLOOD PRESSURE 90 tablet 1  . pantoprazole (PROTONIX) 40 MG tablet TAKE 1 TABLET BY MOUTH EVERY DAY 90 tablet 1  . sildenafil (VIAGRA) 50 MG tablet Take 1 tablet (50 mg total) by mouth daily as needed for erectile dysfunction. 10 tablet 2  . simvastatin (ZOCOR) 20 MG tablet Take 1 tablet (20 mg total) by mouth at bedtime. 90 tablet 1   No current facility-administered medications for  this visit.     ROS:   General:  No weight loss, Fever, chills  HEENT: No recent headaches, no nasal bleeding, no visual changes, no sore throat  Neurologic: No dizziness, blackouts, seizures. No recent symptoms of stroke or mini- stroke. No recent episodes of slurred speech, or temporary blindness.  Cardiac: No recent episodes of chest pain/pressure, no shortness of breath at rest.  No shortness of breath with exertion.  Denies history of atrial fibrillation or irregular heartbeat  Vascular: No history of rest pain in feet.  No history of claudication.  No history of non-healing ulcer, No history of DVT   Pulmonary: No home oxygen, no productive cough, no hemoptysis,  No asthma or wheezing  Musculoskeletal:  [ ]  Arthritis, [ ]  Low back pain,  [ ]  Joint pain  Hematologic:No history of hypercoagulable state.  No history of easy bleeding.  No history of anemia  Gastrointestinal: No hematochezia or melena,  No gastroesophageal reflux, no trouble swallowing  Urinary: [ ]  chronic Kidney disease, [ ]  on HD - [ ]  MWF or [ ]  TTHS, [ ]  Burning with urination, [ ]  Frequent urination, [ ]  Difficulty urinating;   Skin: No rashes  Psychological: No history of anxiety,  No history of depression   Physical Examination  Vitals:   12/31/18 0904  BP: 133/84  Pulse: (!) 58  Resp: 18  SpO2: 96%  Weight: 232 lb (105.2 kg)  Height: 6' (1.829 m)    Body mass index is 31.46 kg/m.  General:  Alert and oriented, no acute distress HEENT: Normal, normocephalic Neck: No bruit or JVD Pulmonary: Clear to auscultation bilaterally Cardiac: Regular Rate and Rhythm without murmur Gastrointestinal: Soft, non-tender, non-distended, no mass Skin: No rash Extremity Pulses:  2+ radial, brachial, femoral, dorsalis pedis pulses bilaterally Musculoskeletal: No deformity or edema  Neurologic: Upper and lower extremity motor 5/5 and symmetric  DATA:    Endovascular Aortic Repair  (EVAR): +----------+----------------+-------------------+-------------------+           Diameter AP (cm)Diameter Trans (cm)Velocities (cm/sec) +----------+----------------+-------------------+-------------------+ Aorta     4.92                               77                  +----------+----------------+-------------------+-------------------+ Right Limb1.54            1.49                                   +----------+----------------+-------------------+-------------------+  Left Limb 1.75            1.91                                   +----------+----------------+-------------------+-------------------+      Summary: Abdominal Aorta: EVAR appears patent with an increase in the maximum diameter when compared to the previous exam. The largest aortic diameter has increased compared to prior exam. Previous diameter measurement was 4.2 cm obtained on 11/12/2017; however,  imaging on this exam date were suboptimal due to overlying bowel gas. Endoleak was not detected; however, bowel gas interference could be obstructing visualization.     ASSESSMENT:  AAA s/p EVAR by DR. Ruel Favors in 2013  Reviewed CTA from 10/31/2017 IMPRESSION: VASCULAR  1. Slight interval growth of 3.9 cm infrarenal aortic aneurysm from 3.7 cm, status post endovascular stent graft repair. Similar sac dimension at bifurcation without endoleak. 2. Mild atherosclerosis without stenosis.   PLAN: He has slowly had an increase in the aneurysmal sac and is now measuring 4.9 cm on duplex today.  Previously and today there is no evidence of endo leak.  I will schedule him for CTA Abd/pelvis to get a move accurate measurement of the AAA.  He will f/u in 1-2 weeks after the CTA.   Roxy Horseman PA-C [facility] [place] Office: [phone]  MD in clinic:[place]

## 2018-12-31 ENCOUNTER — Other Ambulatory Visit (HOSPITAL_COMMUNITY): Payer: 59

## 2018-12-31 ENCOUNTER — Ambulatory Visit (HOSPITAL_COMMUNITY)
Admission: RE | Admit: 2018-12-31 | Discharge: 2018-12-31 | Disposition: A | Discharge status: Home / Self Care | Payer: 59 | Source: Ambulatory Visit | Attending: Family | Admitting: Family

## 2018-12-31 ENCOUNTER — Ambulatory Visit (INDEPENDENT_AMBULATORY_CARE_PROVIDER_SITE_OTHER): Payer: 59 | Admitting: Physician Assistant

## 2018-12-31 ENCOUNTER — Ambulatory Visit: Payer: 59 | Admitting: Family

## 2018-12-31 ENCOUNTER — Encounter: Payer: Self-pay | Admitting: Family

## 2018-12-31 ENCOUNTER — Other Ambulatory Visit: Payer: Self-pay

## 2018-12-31 VITALS — BP 133/84 | HR 58 | Resp 18 | Ht 72.0 in | Wt 232.0 lb

## 2018-12-31 DIAGNOSIS — I714 Abdominal aortic aneurysm, without rupture, unspecified: Secondary | ICD-10-CM

## 2018-12-31 DIAGNOSIS — Z95828 Presence of other vascular implants and grafts: Secondary | ICD-10-CM | POA: Diagnosis not present

## 2018-12-31 DIAGNOSIS — Z87891 Personal history of nicotine dependence: Secondary | ICD-10-CM | POA: Diagnosis not present

## 2019-01-03 ENCOUNTER — Telehealth: Payer: Self-pay | Admitting: Physician Assistant

## 2019-01-03 ENCOUNTER — Telehealth: Payer: Self-pay | Admitting: *Deleted

## 2019-01-03 NOTE — Telephone Encounter (Signed)
Patient called [facility] office with back pain he believes is likely from moving boxes.  He however is concerned back pain could be related to expanding AAA.  Patient was advised to report to ED if he is concerned back pain is related to expanding AAA.  He elected to wait for scheduled CTA abd/pelvis on Thursday 01/06/19.  Dagoberto Ligas, PA-C [facility] [phone] 01/03/2019  4:12 PM

## 2019-01-03 NOTE — Telephone Encounter (Signed)
Patient called and stated that his Aneurysm went from 4.2 to 4.9. Patient is wanting to know if his Viagra would affect this. Stated that he takes it 2-3 times a month. Stated that he is scheduled to have a CT Chest/Abd. On Thursday.  Please Advise.     12/31/2018 PLAN: He has slowly had an increase in the aneurysmal sac and is now measuring 4.9 cm on duplex today.  Previously and today there is no evidence of endo leak.  I will schedule him for CTA Abd/pelvis to get a move accurate measurement of the AAA.  He will f/u in 1-2 weeks after the CTA.   Roxy Horseman PA-C [facility] [place] Office: [phone]

## 2019-01-04 NOTE — Telephone Encounter (Signed)
I would not think Viagra would effect an aneurysm

## 2019-01-04 NOTE — Telephone Encounter (Signed)
LMOM to return call.

## 2019-01-06 ENCOUNTER — Ambulatory Visit
Admission: RE | Admit: 2019-01-06 | Discharge: 2019-01-06 | Disposition: A | Discharge status: Home / Self Care | Payer: 59 | Source: Ambulatory Visit | Attending: Vascular Surgery | Admitting: Vascular Surgery

## 2019-01-06 ENCOUNTER — Other Ambulatory Visit: Payer: Self-pay | Admitting: Nurse Practitioner

## 2019-01-06 ENCOUNTER — Other Ambulatory Visit: Payer: 59

## 2019-01-06 ENCOUNTER — Ambulatory Visit
Admission: RE | Admit: 2019-01-06 | Discharge: 2019-01-06 | Disposition: A | Discharge status: Home / Self Care | Payer: 59 | Source: Ambulatory Visit | Attending: Nurse Practitioner | Admitting: Nurse Practitioner

## 2019-01-06 ENCOUNTER — Ambulatory Visit: Payer: 59

## 2019-01-06 DIAGNOSIS — I714 Abdominal aortic aneurysm, without rupture, unspecified: Secondary | ICD-10-CM

## 2019-01-06 DIAGNOSIS — R911 Solitary pulmonary nodule: Secondary | ICD-10-CM

## 2019-01-06 DIAGNOSIS — J4540 Moderate persistent asthma, uncomplicated: Secondary | ICD-10-CM

## 2019-01-06 DIAGNOSIS — Z95828 Presence of other vascular implants and grafts: Secondary | ICD-10-CM

## 2019-01-06 DIAGNOSIS — R918 Other nonspecific abnormal finding of lung field: Secondary | ICD-10-CM | POA: Diagnosis not present

## 2019-01-06 DIAGNOSIS — Z87891 Personal history of nicotine dependence: Secondary | ICD-10-CM

## 2019-01-06 IMAGING — CT CT CTA ABD/PEL W/CM AND/OR W/O CM
1 of 4 series · 10 of 32 positions shown, 13 images · IV contrast (APPLIED)
Comparison: CT abdomen pelvis-[DATE]; [DATE]; [DATE]

CLINICAL DATA: History of endovascular repair of infrarenal
abdominal aortic aneurysm.

EXAM:
CTA ABDOMEN AND PELVIS WITH CONTRAST
TECHNIQUE: Multidetector CT imaging of the abdomen and pelvis was performed
using the standard protocol during bolus administration of
intravenous contrast. Multiplanar reconstructed images and MIPs were
obtained and reviewed to evaluate the vascular anatomy.
CONTRAST:  75mL [63] IOPAMIDOL ([63]) INJECTION 76%
Creatinine was obtained on site at [HOSPITAL] at [HOSPITAL].
Results: Creatinine 0.9 mg/dL.

[Series 6: angio · axial · 0.85mm/px · z∈[-628,-250]mm · 10 of 228 slices shown, 13 images]
[im 26/228  soft-tissue]
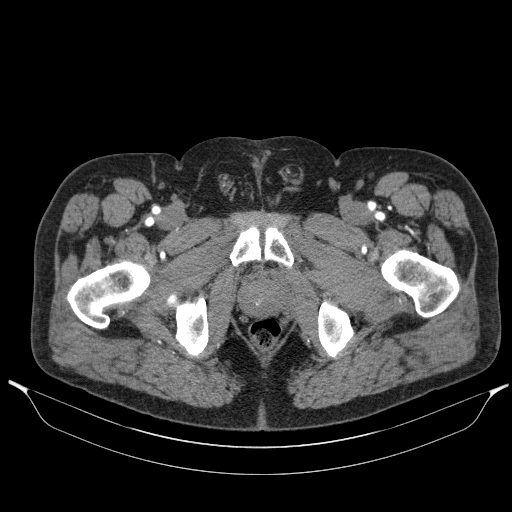
[im 26/228  bone]
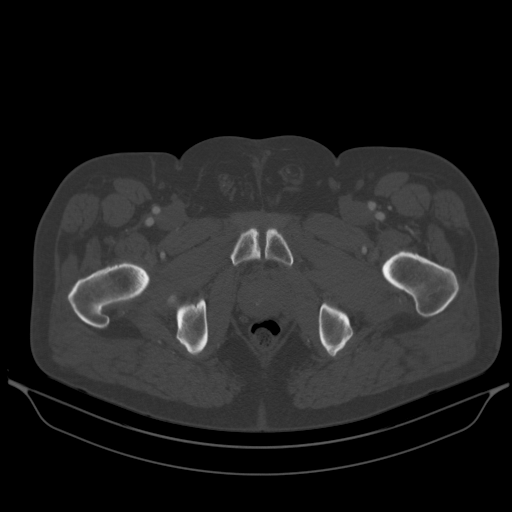
[im 51/228  soft-tissue]
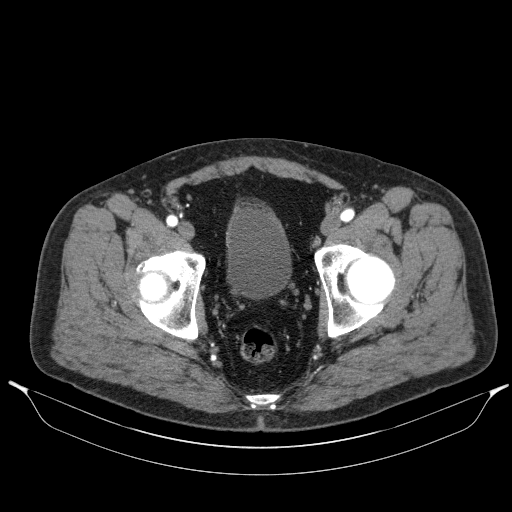
[im 76/228  soft-tissue]
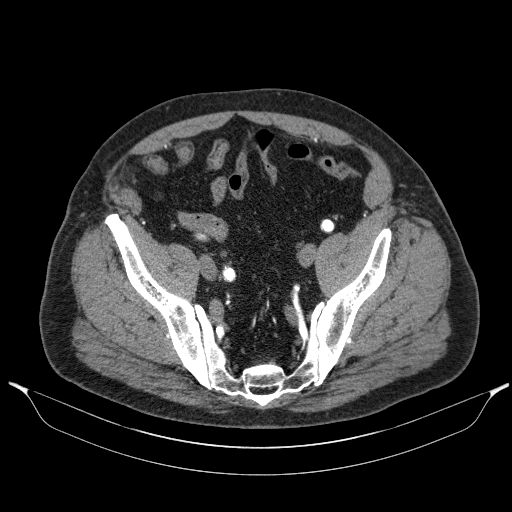
[im 101/228  soft-tissue]
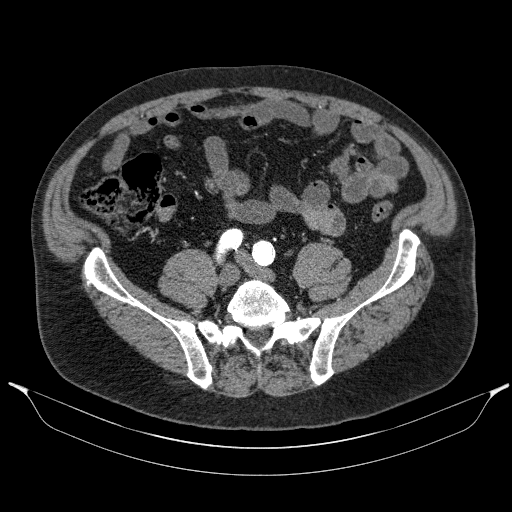
[im 127/228  soft-tissue]
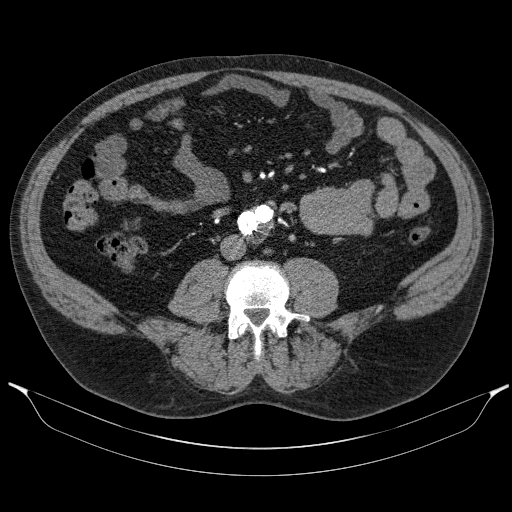
[im 152/228  soft-tissue]
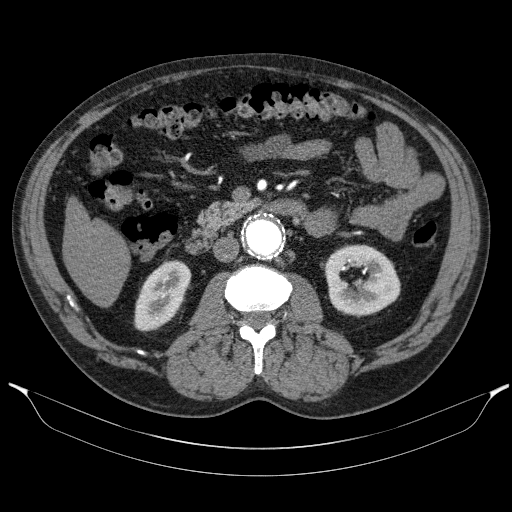
[im 177/228  soft-tissue]
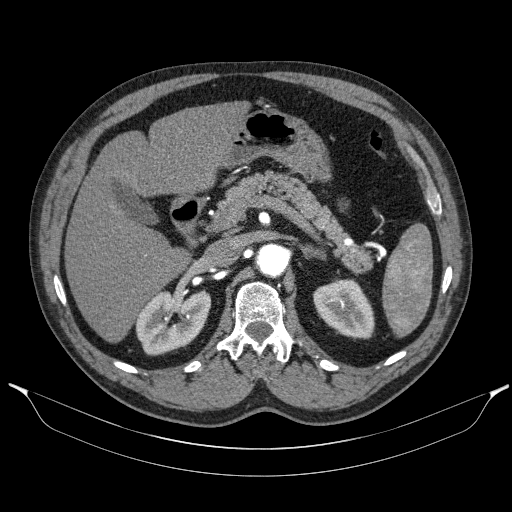
[im 177/228  lung]
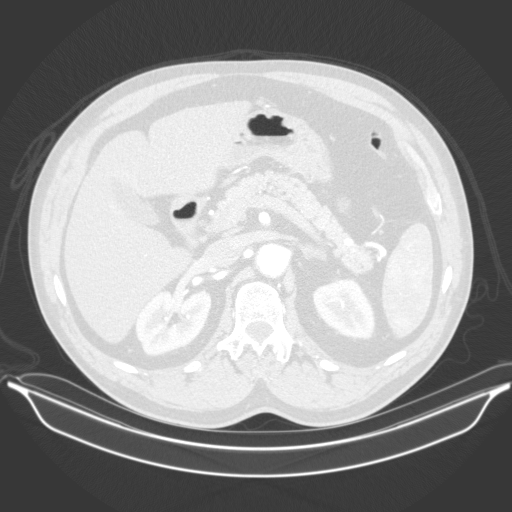
[im 190/228  lung]
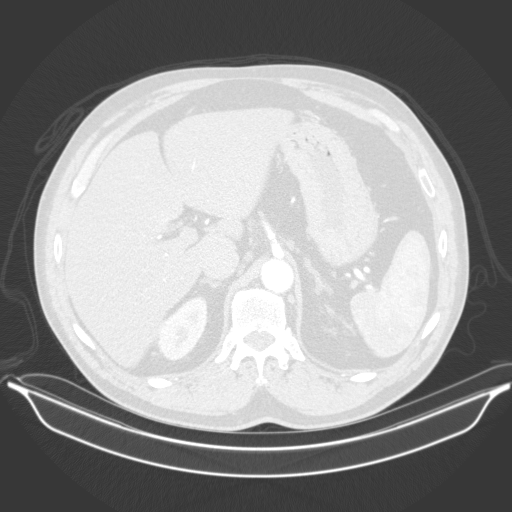
[im 202/228  soft-tissue]
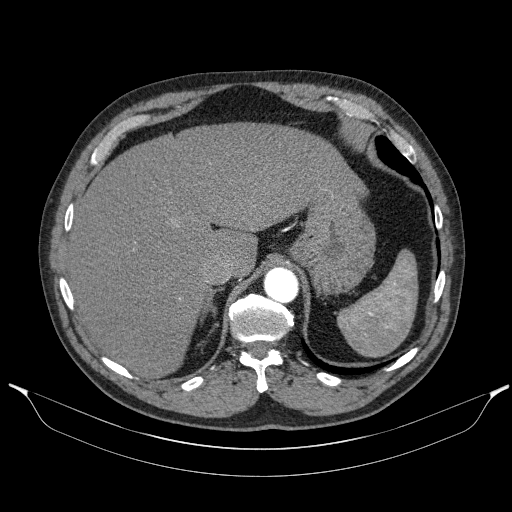
[im 202/228  lung]
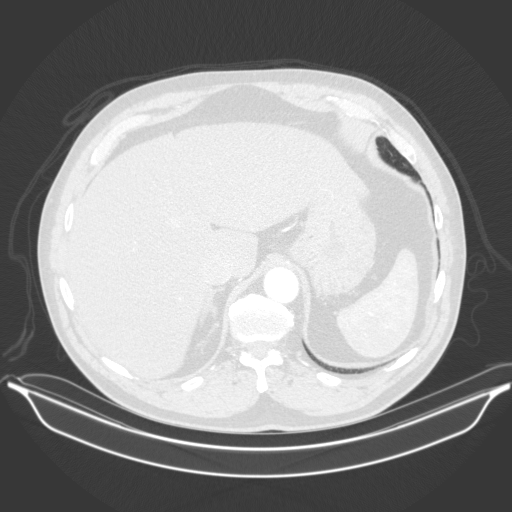
[im 215/228  lung]
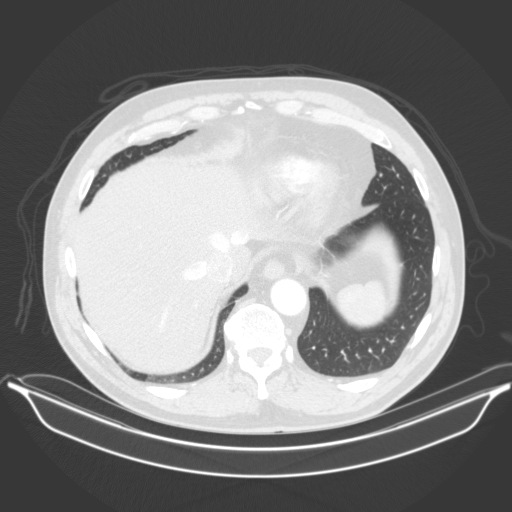

[10 of 32 positions shown; findings below may reference images not displayed]

FINDINGS: VASCULAR

Aorta: Post endovascular repair of infrarenal abdominal aortic
aneurysm. There is a minimal amount crescentic thrombus involving
the proximal end of the stent graft as well as the left common iliac
gait, similar to the [DATE] examination and again not resulting in
hemodynamically significant stenosis. The proximal end of the stent
graft is well apposed against the walls of the infrarenal abdominal
aorta while the distal limbs are well apposed against the walls of
the bilateral common iliac arteries.

There has been continued reduction in size of the excluded native
abdominal aortic aneurysm sac, currently nearly abutting the aortic
aneurysm stent graft, measuring 2.9 x 3.2 x 3.0 as measured in
greatest oblique short axis axial (image 104, series 6), sagittal
(image 98, series 12) and coronal (image 68, series 10) dimensions
respectively, previously, 3.8 x 3.7 x 4.3 cm when compared to the
[DATE] examination.

Questioned interval enlargement of the proximal end of the stent
graft is not confirmed on the present examination with the proximal
end of the stent graft measuring approximately 3.6 x 3.7 x 3.7 cm as
measured in greatest oblique short axis axial (image 70, series 6),
coronal (image 76, series 10) and sagittal (image 99, series 12)
dimensions respectively, previously, 3.5 x 3.5 x 3.8 cm when
compared to the [DATE] examination.

There is no definitive enhancement of the excluded native abdominal
aortic aneurysm sac to suggest the presence of an endoleak. No
periaortic stranding.

Celiac: There is a very minimal amount of calcified atherosclerotic
plaque involving the origin and proximal aspect of the celiac
artery, not resulting in a hemodynamically significant stenosis.
Conventional branching pattern.

SMA: Widely patent without hemodynamically significant narrowing.
There is a minimal amount of eccentric mixed calcified and
noncalcified atherosclerotic plaque involving the mid aspect the
main trunk of the SMA, not resulting in hemodynamically significant
stenosis. Conventional branching pattern. The distal tributaries the
SMA appear widely patent without discrete intraluminal filling
defect to suggest distal embolism.

Renals: Solitary bilaterally however note is made of an early
bifurcation of the right renal artery. There is a minimal amount of
eccentric mixed calcified and noncalcified atherosclerotic plaque
involving the origin and main division of the right renal artery,
not resulting in hemodynamically significant stenosis. No vessel
irregularity to suggest FMD.

IMA: Occluded at its origin with early reconstitution via collateral
supply from the SMA.

Inflow: As above, the distal limbs of the aortic stent graft are
well apposed against the walls of the bilateral common iliac
arteries. There is unchanged minimal amount of crescentic thrombus
within the left iliac gait, not resulting in hemodynamically
significant stenosis. The left common iliac artery remains mildly
ectatic measuring approximately 2 cm in diameter (image 131, series
6). The right common iliac artery is tortuous but of normal caliber
without hemodynamically significant stenosis.

The bilateral internal iliac arteries are mildly disease though
patent of normal caliber.

There is a minimal amount of eccentric mixed calcified and
noncalcified atherosclerotic plaque involving the bilateral external
iliac arteries, not resulting in hemodynamically significant
stenosis.

Proximal Outflow: Minimal amount of eccentric mixed calcified and
noncalcified atherosclerotic plaque involving the bilateral common
femoral arteries, not resulting in hemodynamically significant
stenosis. The imaged portions of the bilateral superficial and deep
femoral arteries are widely patent without hemodynamically
significant narrowing.

Veins: The IVC and pelvic venous system appear widely patent.

Review of the MIP images confirms the above findings.

_________________________________________________________

NON-VASCULAR

Lower chest: Limited visualization of the lower thorax demonstrates
a punctate (approximately 0.4 cm) nodule within the left lower lobe
(image 5, series 9 which is unchanged compared to the [DATE]
examination and thus of benign etiology. No discrete focal airspace
opacities. No pleural effusion.

Normal heart size.  No pericardial effusion.

Hepatobiliary: Normal hepatic contour. There is diffuse decreased
attenuation of the hepatic parenchyma suggestive of hepatic
steatosis. No discrete hepatic lesions. Normal appearance of the
gallbladder given degree distention. No radiopaque gallstones. No
intra extrahepatic biliary duct dilatation. No ascites.

Pancreas: Normal appearance of the pancreas.

Spleen: Normal appearance of the spleen. Note is made of a small
splenule.

Adrenals/Urinary Tract: There is symmetric enhancement of the
bilateral kidneys. No definite renal stones on this postcontrast
examination. Punctate (approximately 0.6 cm) partially exophytic
lesion involving the anterior superior aspect the left kidney (image
61, series 6, suit small to adequately characterize though unchanged
compared to the [DATE] examination favored to represent a renal
cyst. No discrete right-sided renal lesions. No urine obstruction or
perinephric stranding.

There is thickening involving the crux of the left adrenal gland.
Normal appearance of the right adrenal gland. There is mild
thickening of the dome of the urinary bladder wall, potentially
accentuated due to underdistention.

Stomach/Bowel: Moderate colonic stool burden without evidence of
enteric obstruction. Normal appearance of the terminal ileum. The
appendix is not visualized, however there is no pericecal
inflammatory change. Note is again made of a small duodenal
diverticulum. No pneumoperitoneum, pneumatosis or portal venous gas.

Lymphatic: Scattered retroperitoneal lymph nodes are numerous though
individually not enlarged by size criteria. No bulky
retroperitoneal, mesenteric, pelvic or inguinal lymphadenopathy.

Reproductive: Dystrophic calcifications within normal sized prostate
gland. No free fluid in the pelvic cul-de-sac.

Other: Tiny mesenteric fat containing peri umbilical hernia.

Musculoskeletal: No acute or aggressive osseous abnormalities.
Mild-to-moderate multilevel lumbar spine DDD, worse at L4-L5 and
L5-S1 with disc space height loss, endplate irregularity and
sclerosis.
IMPRESSION: VASCULAR

1. Post endovascular repair of infrarenal abdominal aortic aneurysm
with continued reduction in size of native abdominal aortic aneurysm
sac (presently nearly abutting the aortic stent graft) and without
evidence of an endoleak.
2. Questioned interval enlargement of the proximal end of the stent
graft is not confirmed on the present examination with measurements
as above, grossly unchanged compared to the [DATE] examination.
3.  Aortic aneurysm NOS ([63]-[63]).
4.  Aortic Atherosclerosis ([63]-[63]).

NON-VASCULAR

1. Suspected hepatic steatosis. Correlation with LFTs could be
performed as clinically indicated.
2. Mild thickening of the dome of the urinary bladder wall,
potentially accentuated due to underdistention though cystitis could
have a similar appearance. Clinical correlation is advised.

## 2019-01-06 IMAGING — CT CT CHEST W/O CM
1 series · 14 of 34 positions shown, 18 images · non-contrast
Comparison: [DATE] screening chest CT. [DATE] chest
radiograph.

CLINICAL DATA: Follow-up pulmonary nodules.

EXAM:
CT CHEST WITHOUT CONTRAST
TECHNIQUE: Multidetector CT imaging of the chest was performed following the
standard protocol without IV contrast.

[Series 2: chest w/(date) · axial · 0.75mm/px · z∈[-324,-20]mm · 14 of 180 slices shown, 18 images]
[im 14/180  mediastinal]
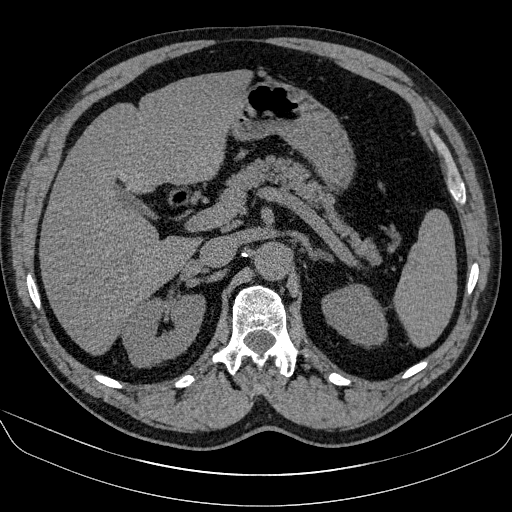
[im 14/180  lung]
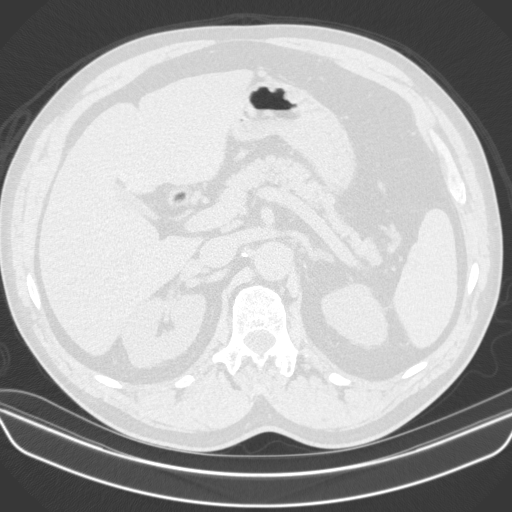
[im 27/180  lung]
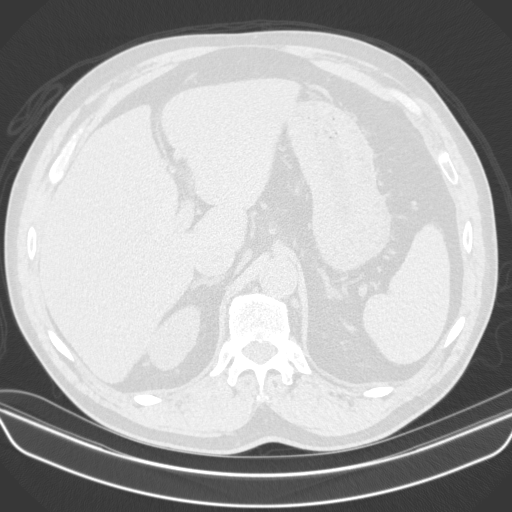
[im 36/180  lung]
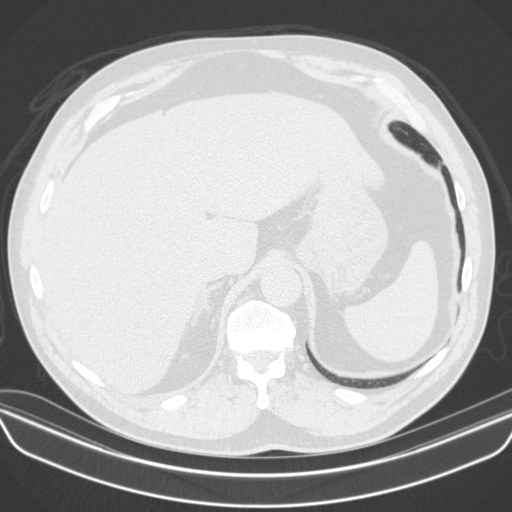
[im 54/180  lung]
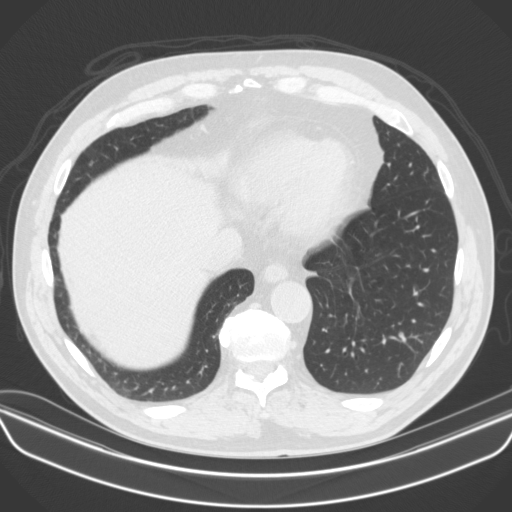
[im 67/180  mediastinal]
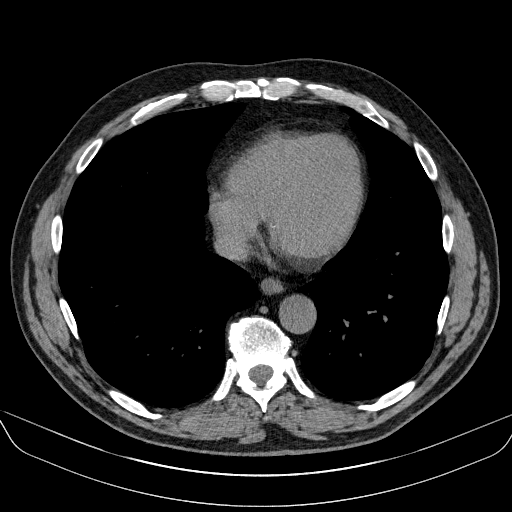
[im 67/180  lung]
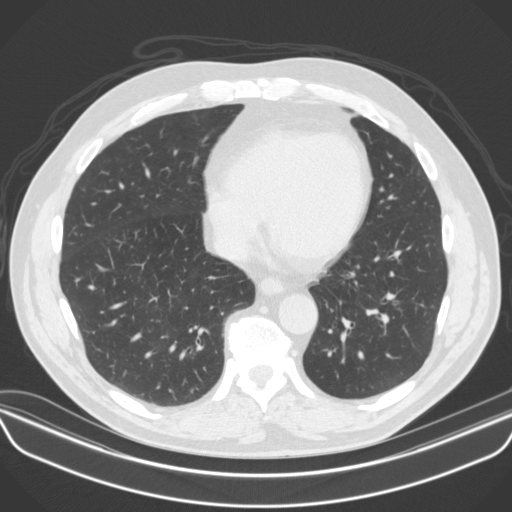
[im 73/180  lung]
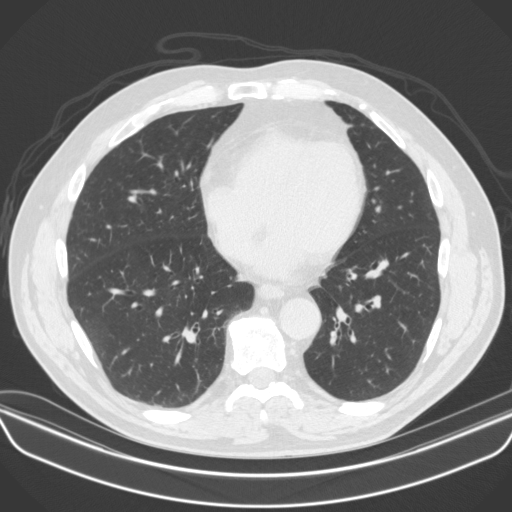
[im 86/180  lung]
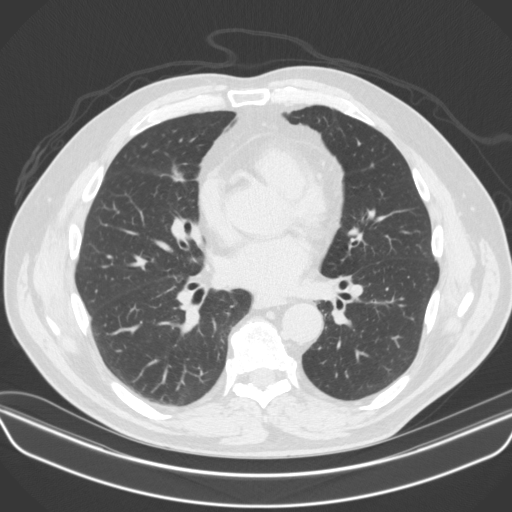
[im 95/180  lung]
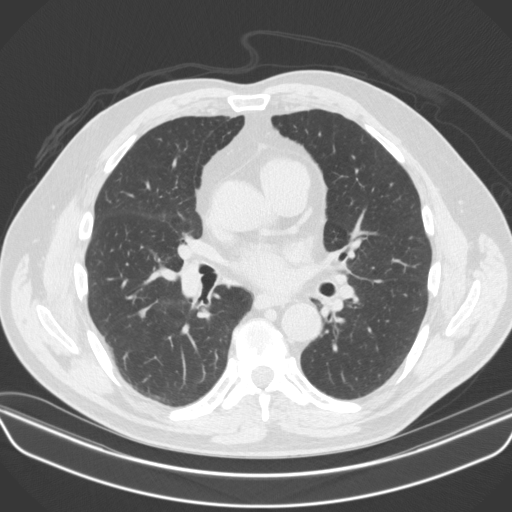
[im 107/180  mediastinal]
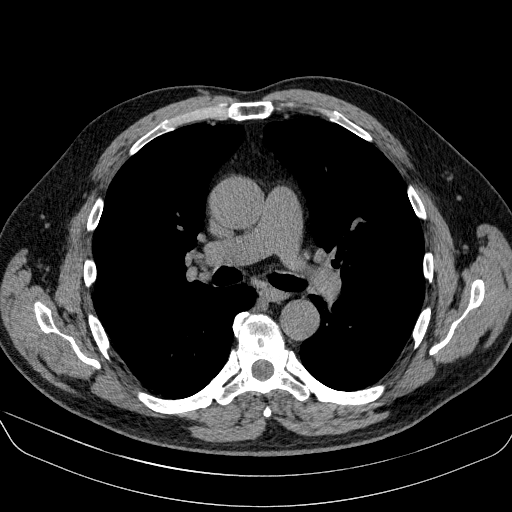
[im 107/180  lung]
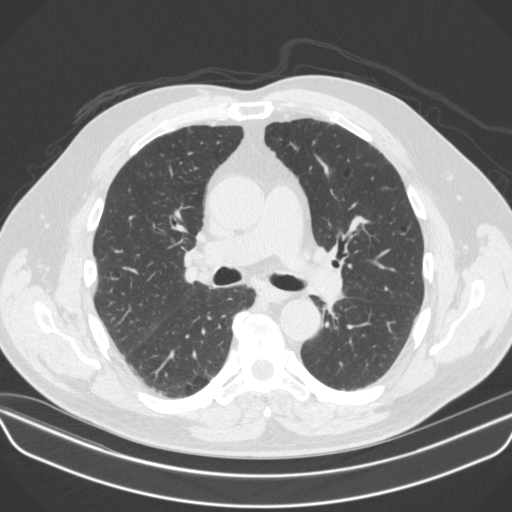
[im 113/180  lung]
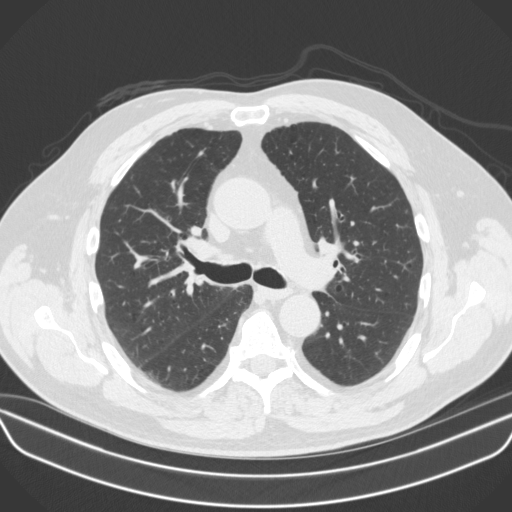
[im 133/180  lung]
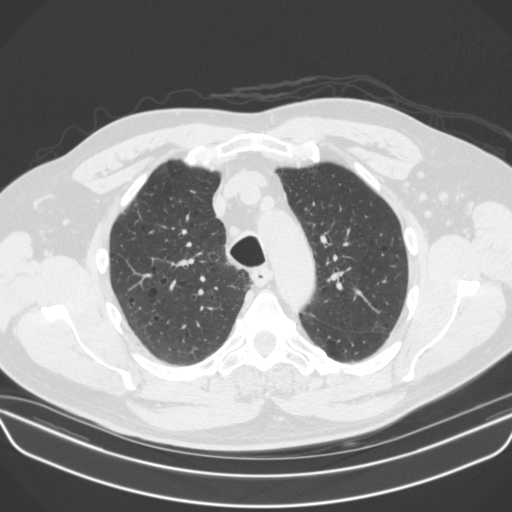
[im 144/180  lung]
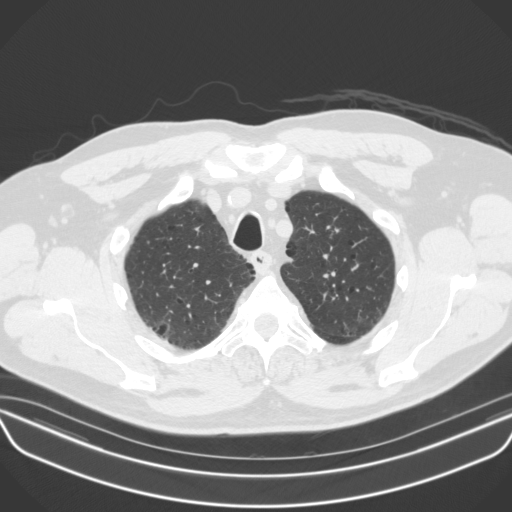
[im 153/180  mediastinal]
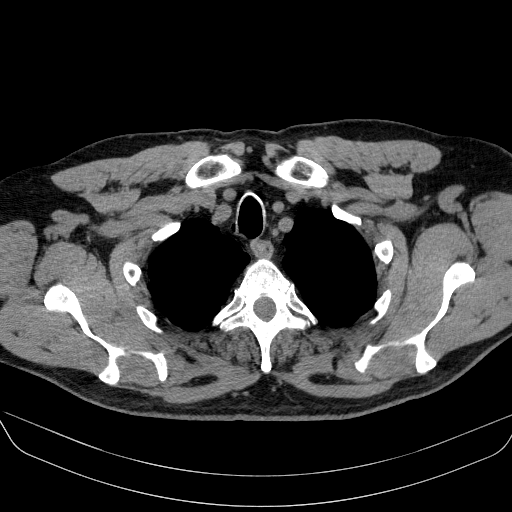
[im 153/180  lung]
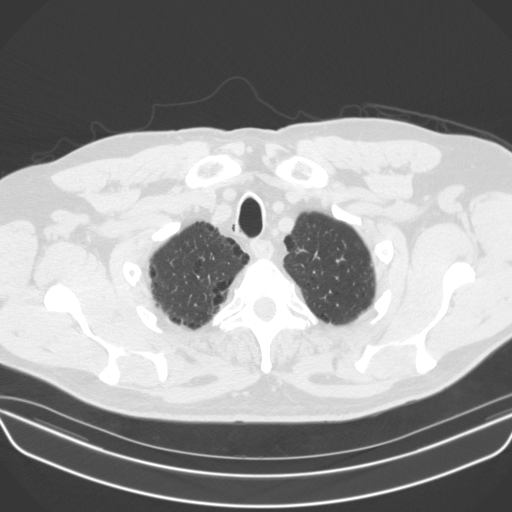
[im 166/180  lung]
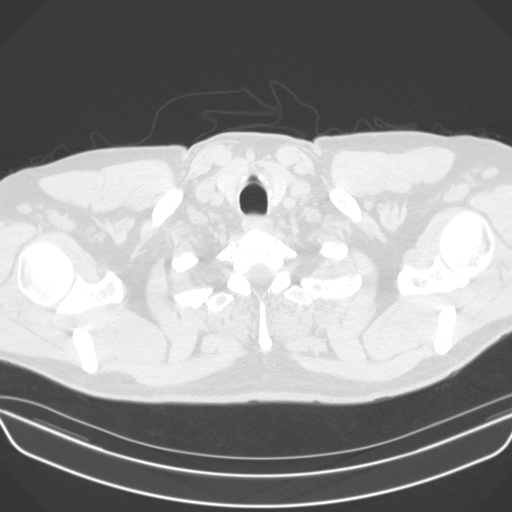

[14 of 34 positions shown; findings below may reference images not displayed]

FINDINGS: Cardiovascular: Normal heart size. No significant pericardial
effusion/thickening. Left anterior descending and right coronary
atherosclerosis. Atherosclerotic thoracic aorta with stable 4.2 cm
ectatic ascending thoracic aorta using similar measurement
technique. Stable top-normal main pulmonary artery (3.4 cm
diameter).

Mediastinum/Nodes: No discrete thyroid nodules. Unremarkable
esophagus. No pathologically enlarged axillary, mediastinal or hilar
lymph nodes, noting limited sensitivity for the detection of hilar
adenopathy on this noncontrast study.

Lungs/Pleura: No pneumothorax. No pleural effusion. Moderate
centrilobular and paraseptal emphysema with diffuse bronchial wall
thickening. No acute consolidative airspace disease or lung masses.
There is a new tiny 2 mm medial right upper lobe pulmonary nodule
(series 3/image 78). Several additional scattered solid pulmonary
nodules throughout both lungs, largest 6 mm in the anterior right
lower lobe (series 3/image 86), all stable since [DATE]
screening chest CT, considered benign. No additional new significant
pulmonary nodules.

Upper abdomen: Diffuse hepatic steatosis. Hyperdense 0.9 cm renal
cortical lesion in the anterior upper left kidney, stable in size
since [DATE], probably a benign hemorrhagic/proteinaceous renal
cyst, for which no follow-up is required. Partially visualized
abdominal aorta stent graft repair.

Musculoskeletal: No aggressive appearing focal osseous lesions. Mild
thoracic spondylosis.
IMPRESSION: 1. New tiny solid 2 mm right upper lobe pulmonary nodule. Follow-up
chest CT recommended in 12 months in this high risk patient.
2. All previously visualized pulmonary nodules are stable since
[DATE] screening chest CT, considered benign.
3. Stable 4.2 cm ectatic ascending thoracic aorta. Recommend annual
imaging followup by CTA or MRA. This recommendation follows [IB]
ACCF/AHA/AATS/ACR/ASA/SCA/FATHIYA/FATHIYA/FATHIYA/FATHIYA Guidelines for the
Diagnosis and Management of Patients with Thoracic Aortic Disease.
Circulation. [IB]; 121: [id number]. Aortic aneurysm NOS
([IB]-[IB]).
4. Two-vessel coronary atherosclerosis.
5. Diffuse hepatic steatosis.

Aortic Atherosclerosis ([IB]-[IB]) and Emphysema ([IB]-[IB]).

## 2019-01-06 MED ORDER — IOPAMIDOL (ISOVUE-370) INJECTION 76%
75.0000 mL | Freq: Once | INTRAVENOUS | Status: AC | PRN
  Administered 2019-01-06: 75 mL via INTRAVENOUS

## 2019-01-13 ENCOUNTER — Other Ambulatory Visit: Payer: Self-pay | Admitting: Nurse Practitioner

## 2019-01-13 DIAGNOSIS — N529 Male erectile dysfunction, unspecified: Secondary | ICD-10-CM

## 2019-01-14 ENCOUNTER — Ambulatory Visit (INDEPENDENT_AMBULATORY_CARE_PROVIDER_SITE_OTHER): Payer: 59 | Admitting: Nurse Practitioner

## 2019-01-14 ENCOUNTER — Encounter: Payer: Self-pay | Admitting: Nurse Practitioner

## 2019-01-14 VITALS — BP 140/88 | HR 84 | Temp 98.2°F | Ht 72.0 in | Wt 230.0 lb

## 2019-01-14 DIAGNOSIS — K760 Fatty (change of) liver, not elsewhere classified: Secondary | ICD-10-CM

## 2019-01-14 DIAGNOSIS — I714 Abdominal aortic aneurysm, without rupture, unspecified: Secondary | ICD-10-CM

## 2019-01-14 DIAGNOSIS — Z23 Encounter for immunization: Secondary | ICD-10-CM

## 2019-01-14 DIAGNOSIS — R911 Solitary pulmonary nodule: Secondary | ICD-10-CM

## 2019-01-14 DIAGNOSIS — E782 Mixed hyperlipidemia: Secondary | ICD-10-CM | POA: Diagnosis not present

## 2019-01-14 DIAGNOSIS — F419 Anxiety disorder, unspecified: Secondary | ICD-10-CM

## 2019-01-14 DIAGNOSIS — I10 Essential (primary) hypertension: Secondary | ICD-10-CM

## 2019-01-14 DIAGNOSIS — E669 Obesity, unspecified: Secondary | ICD-10-CM

## 2019-01-14 DIAGNOSIS — I2510 Atherosclerotic heart disease of native coronary artery without angina pectoris: Secondary | ICD-10-CM

## 2019-01-14 MED ORDER — ALPRAZOLAM 0.25 MG PO TABS: 0.2500 mg | ORAL_TABLET | Freq: Two times a day (BID) | ORAL | 0 refills | Status: DC | PRN

## 2019-01-14 MED ORDER — SERTRALINE HCL 50 MG PO TABS: 50.0000 mg | ORAL_TABLET | Freq: Every day | ORAL | 1 refills | Status: DC

## 2019-01-14 NOTE — Patient Instructions (Addendum)
To start zoloft 50 mg 1/2 tablet by mouth daily for 1 week then increase to 1 tablet.  To use xanax as needed for severe anxiety/panic  To start exercise as tolerates 30 mins/5 days a week  We will recheck fasting blood work at your next office visit.    Heart-Healthy Eating Plan Many factors influence your heart (coronary) health, including eating and exercise habits. Coronary risk increases with abnormal blood fat (lipid) levels. Heart-healthy meal planning includes limiting unhealthy fats, increasing healthy fats, and making other diet and lifestyle changes. What are tips for following this plan? Cooking Cook foods using methods other than frying. Baking, boiling, grilling, and broiling are all good options. Other ways to reduce fat include:  Removing the skin from poultry.  Removing all visible fats from meats.  Steaming vegetables in water or broth. Meal planning   At meals, imagine dividing your plate into fourths: ? Fill one-half of your plate with vegetables and green salads. ? Fill one-fourth of your plate with whole grains. ? Fill one-fourth of your plate with lean protein foods.  Eat 4-5 servings of vegetables per day. One serving equals 1 cup raw or cooked vegetable, or 2 cups raw leafy greens.  Eat 4-5 servings of fruit per day. One serving equals 1 medium whole fruit,  cup dried fruit,  cup fresh, frozen, or canned fruit, or  cup 100% fruit juice.  Eat more foods that contain soluble fiber. Examples include apples, broccoli, carrots, beans, peas, and barley. Aim to get 25-30 g of fiber per day.  Increase your consumption of legumes, nuts, and seeds to 4-5 servings per week. One serving of dried beans or legumes equals  cup cooked, 1 serving of nuts is  cup, and 1 serving of seeds equals 1 tablespoon. Fats  Choose healthy fats more often. Choose monounsaturated and polyunsaturated fats, such as olive and canola oils, flaxseeds, walnuts, almonds, and seeds.  Eat  more omega-3 fats. Choose salmon, mackerel, sardines, tuna, flaxseed oil, and ground flaxseeds. Aim to eat fish at least 2 times each week.  Check food labels carefully to identify foods with trans fats or high amounts of saturated fat.  Limit saturated fats. These are found in animal products, such as meats, butter, and cream. Plant sources of saturated fats include palm oil, palm kernel oil, and coconut oil.  Avoid foods with partially hydrogenated oils in them. These contain trans fats. Examples are stick margarine, some tub margarines, cookies, crackers, and other baked goods.  Avoid fried foods. General information  Eat more home-cooked food and less restaurant, buffet, and fast food.  Limit or avoid alcohol.  Limit foods that are high in starch and sugar.  Lose weight if you are overweight. Losing just 5-10% of your body weight can help your overall health and prevent diseases such as diabetes and heart disease.  Monitor your salt (sodium) intake, especially if you have high blood pressure. Talk with your health care provider about your sodium intake.  Try to incorporate more vegetarian meals weekly. What foods can I eat? Fruits All fresh, canned (in natural juice), or frozen fruits. Vegetables Fresh or frozen vegetables (raw, steamed, roasted, or grilled). Green salads. Grains Most grains. Choose whole wheat and whole grains most of the time. Rice and pasta, including brown rice and pastas made with whole wheat. Meats and other proteins Lean, well-trimmed beef, veal, pork, and lamb. Chicken and [place] without skin. All fish and shellfish. Wild duck, rabbit, pheasant, and venison. Egg  whites or low-cholesterol egg substitutes. Dried beans, peas, lentils, and tofu. Seeds and most nuts. Dairy Low-fat or nonfat cheeses, including ricotta and mozzarella. Skim or 1% milk (liquid, powdered, or evaporated). Buttermilk made with low-fat milk. Nonfat or low-fat yogurt. Fats and  oils Non-hydrogenated (trans-free) margarines. Vegetable oils, including soybean, sesame, sunflower, olive, peanut, safflower, corn, canola, and cottonseed. Salad dressings or mayonnaise made with a vegetable oil. Beverages Water (mineral or sparkling). Coffee and tea. Diet carbonated beverages. Sweets and desserts Sherbet, gelatin, and fruit ice. Small amounts of dark chocolate. Limit all sweets and desserts. Seasonings and condiments All seasonings and condiments. The items listed above may not be a complete list of foods and beverages you can eat. Contact a dietitian for more options. What foods are not recommended? Fruits Canned fruit in heavy syrup. Fruit in cream or butter sauce. Fried fruit. Limit coconut. Vegetables Vegetables cooked in cheese, cream, or butter sauce. Fried vegetables. Grains Breads made with saturated or trans fats, oils, or whole milk. Croissants. Sweet rolls. Donuts. High-fat crackers, such as cheese crackers. Meats and other proteins Fatty meats, such as hot dogs, ribs, sausage, bacon, rib-eye roast or steak. High-fat deli meats, such as salami and bologna. Caviar. Domestic duck and goose. Organ meats, such as liver. Dairy Cream, sour cream, cream cheese, and creamed cottage cheese. Whole milk cheeses. Whole or 2% milk (liquid, evaporated, or condensed). Whole buttermilk. Cream sauce or high-fat cheese sauce. Whole-milk yogurt. Fats and oils Meat fat, or shortening. Cocoa butter, hydrogenated oils, palm oil, coconut oil, palm kernel oil. Solid fats and shortenings, including bacon fat, salt pork, lard, and butter. Nondairy cream substitutes. Salad dressings with cheese or sour cream. Beverages Regular sodas and any drinks with added sugar. Sweets and desserts Frosting. Pudding. Cookies. Cakes. Pies. Milk chocolate or white chocolate. Buttered syrups. Full-fat ice cream or ice cream drinks. The items listed above may not be a complete list of foods and beverages  to avoid. Contact a dietitian for more information. Summary  Heart-healthy meal planning includes limiting unhealthy fats, increasing healthy fats, and making other diet and lifestyle changes.  Lose weight if you are overweight. Losing just 5-10% of your body weight can help your overall health and prevent diseases such as diabetes and heart disease.  Focus on eating a balance of foods, including fruits and vegetables, low-fat or nonfat dairy, lean protein, nuts and legumes, whole grains, and heart-healthy oils and fats. This information is not intended to replace advice given to you by your health care provider. Make sure you discuss any questions you have with your health care provider. Document Released: 08/19/2008 Document Revised: 12/18/2017 Document Reviewed: 12/18/2017 Elsevier Interactive Patient Education  2019 [employer].

## 2019-01-14 NOTE — Progress Notes (Signed)
Careteam: [name], NP as PCP - General (Nurse Practitioner) [name], [name], NP as Nurse Practitioner (Vascular Surgery) [name], MD as Consulting Physician (General Surgery) Brand Males, MD as Consulting Physician (Pulmonary Disease) [name], MD as Consulting Physician (Cardiology) [name], PA-C as Physician Assistant (Dermatology)  Advanced Directive information    No Known Allergies  Chief Complaint  Patient presents with  . Medical Management of Chronic Issues    questions about CT scan     HPI: Patient is a 62 y.o. male seen in the office today to review screening CT lungs.  Upset that there was so much controversy with the orders and that there was a lot of communications with that.  Lots of family stressors. Uncle died and he was in charge of his estate.  Lots of traveling.   Increase anxiety. Has not been on medication for anxiety in the past.   Has made dietary modifications and lost 10 lbs.   Hyperlipidemia- has made dietary modification, currently on zocor 20 mg daily   AAA- stressed over the increase in size. Concerned that Viagra is causing it to worsen.   htn- elevated today but very worked up. Had 3 cups of coffee before he came into office. Generally well controlled. Taking losartan 50 mg daily.    Review of Systems:  Review of Systems  Constitutional: Negative for chills, fever and weight loss.  Respiratory: Negative for cough, sputum production and shortness of breath.   Cardiovascular: Negative for chest pain, palpitations and leg swelling.  Gastrointestinal: Negative for abdominal pain, constipation, diarrhea and heartburn.  Skin: Negative.   Neurological: Negative for dizziness and headaches.  Psychiatric/Behavioral: Negative for depression, memory loss, substance abuse and suicidal ideas. The patient is nervous/anxious and has insomnia.     Past Medical History:  Diagnosis Date    . Abdominal aortic aneurysm [facility])    sees Dr. Einar Gip for cardiac f/u, [phone]  . Arthritis    lower back  . Asthma    followed by Dr. Berdie Ogren  . Chronic airway obstruction, not elsewhere classified   . Dyspnea    normal PFT April 2012  . External hemorrhoids without mention of complication   . GERD (gastroesophageal reflux disease)   . Gout   . H/O hiatal hernia   . Hemorrhoids   . HTN (hypertension)    hx of sees Dr. Graylin Shiver  . Hyperlipidemia   . Insomnia, unspecified   . Other abnormal blood chemistry   . Other dyspnea and respiratory abnormality   . Other malaise and fatigue   . Other specified erythematous condition(695.89)   . Other testicular dysfunction   . Prostatitis, unspecified   . Rash 2011   left chest, biopsy 2012 Dr. Rozann Lesches, results pending  . Skin cancer   . Tobacco abuse    Past Surgical History:  Procedure Laterality Date  . ABDOMINAL AORTIC ENDOVASCULAR STENT GRAFT  09/22/2012   Procedure: ABDOMINAL AORTIC ENDOVASCULAR STENT GRAFT;  Surgeon: Mal Misty, MD;  Location: [place];  Service: Vascular;  Laterality: N/A;  GORE; ultrasound guided.  . APPENDECTOMY    . EVALUATION UNDER ANESTHESIA WITH HEMORRHOIDECTOMY N/A 12/31/2016   Procedure: EXAM UNDER ANESTHESIA WITH HEMORRHOIDECTOMY;  Surgeon: [name], MD;  Location: [place];  Service: General;  Laterality: N/A;  . SKIN CANCER EXCISION    . TOE SURGERY  01/2012   joint of left great toe  . TONSILLECTOMY    .  TONSILLECTOMY    . TYMPANOPLASTY    . WRIST SURGERY     left   Social History:   reports that he quit smoking about 6 years ago. His smoking use included cigarettes. He has a 9.50 pack-year smoking history. He has quit using smokeless tobacco.  His smokeless tobacco use included snuff. He reports that he does not drink alcohol or use drugs.  Family History  Problem Relation Age of Onset  . Heart attack Father 29  . COPD Father   . Heart disease Father    . Asthma Daughter   . Cancer Mother        Breast  . Lupus Daughter   . Diabetes Other        Grandson     Medications: Patient's Medications  New Prescriptions   No medications on file  Previous Medications   ALBUTEROL (PROVENTIL HFA;VENTOLIN HFA) 108 (90 BASE) MCG/ACT INHALER    Inhale 2 puffs into the lungs every 6 (six) hours as needed. For cough and wheezing.   ASPIRIN EC 81 MG TABLET    Take 81 mg by mouth daily.   BREO ELLIPTA 200-25 MCG/INH AEPB    TAKE 1 PUFF BY MOUTH EVERY DAY   LOSARTAN (COZAAR) 50 MG TABLET    TAKE 1 TABLET BY MOUTH ONCE DAILY TO CONTROL BLOOD PRESSURE   PANTOPRAZOLE (PROTONIX) 40 MG TABLET    TAKE 1 TABLET BY MOUTH EVERY DAY   SILDENAFIL (VIAGRA) 50 MG TABLET    TAKE 1 TABLET BY MOUTH DAILY AS NEEDED FOR ERECTILE DYSFUNCTION   SIMVASTATIN (ZOCOR) 20 MG TABLET    Take 1 tablet (20 mg total) by mouth at bedtime.  Modified Medications   No medications on file  Discontinued Medications   DOXYCYCLINE (VIBRA-TABS) 100 MG TABLET    Take 1 tablet (100 mg total) by mouth 2 (two) times daily.     Physical Exam:  Vitals:   01/14/19 1315 01/14/19 1420  BP: (!) 148/90 140/88  Pulse: 84   Temp: 98.2 F (36.8 C)   TempSrc: Oral   SpO2: 94%   Weight: 230 lb (104.3 kg)   Height: 6' (1.829 m)    Body mass index is 31.19 kg/m.  Physical Exam Constitutional:      General: He is not in acute distress.    Appearance: He is well-developed. He is not diaphoretic.  HENT:     Head: Normocephalic and atraumatic.     Mouth/Throat:     Pharynx: No oropharyngeal exudate.  Eyes:     Conjunctiva/sclera: Conjunctivae normal.     Pupils: Pupils are equal, round, and reactive to light.  Neck:     Musculoskeletal: Normal range of motion and neck supple.  Cardiovascular:     Rate and Rhythm: Normal rate and regular rhythm.     Heart sounds: Normal heart sounds.  Pulmonary:     Effort: Pulmonary effort is normal.     Breath sounds: Normal breath sounds.   Abdominal:     General: Bowel sounds are normal.     Palpations: Abdomen is soft.  Musculoskeletal:        General: No tenderness or deformity.  Skin:    General: Skin is warm and dry.  Neurological:     Mental Status: He is alert and oriented to person, place, and time.  Psychiatric:        Mood and Affect: Mood is anxious.        Cognition and Memory:  Cognition normal.    Labs reviewed: Basic Metabolic Panel: Recent Labs    02/10/18 1608 08/26/18 0812  NA 137 139  K 4.0 4.2  CL 103 105  CO2 27 26  GLUCOSE 112 100*  BUN 10 15  CREATININE 1.02 0.94  CALCIUM 8.8 9.3   Liver Function Tests: Recent Labs    08/26/18 0812  AST 25  ALT 27  BILITOT 0.6  PROT 6.6   No results for input(s): LIPASE, AMYLASE in the last 8760 hours. No results for input(s): AMMONIA in the last 8760 hours. CBC: Recent Labs    02/10/18 1608 08/26/18 0812  WBC 7.4 6.3  NEUTROABS 5,676 4,001  HGB 14.7 15.0  HCT 42.1 45.0  MCV 87.7 90.0  PLT 253 253   Lipid Panel: Recent Labs    08/26/18 0812  CHOL 138  HDL 40*  LDLCALC 83  TRIG 66  CHOLHDL 3.5   TSH: No results for input(s): TSH in the last 8760 hours. A1C: Lab Results  Component Value Date   HGBA1C 6.0 (H) 08/09/2015     Assessment/Plan 1. Anxiety -uncontrolled. Lots of stressors. Will start daily zoloft -to start 1/2 tablet for 1 week then increase to 1 tablet daily.  -will give xanax short term while starting zoloft.  - sertraline (ZOLOFT) 50 MG tablet; Take 1 tablet (50 mg total) by mouth daily.  Dispense: 30 tablet; Refill: 1 - ALPRAZolam (XANAX) 0.25 MG tablet; Take 1 tablet (0.25 mg total) by mouth 2 (two) times daily as needed for anxiety.  Dispense: 20 tablet; Refill: 0  2. Solitary pulmonary nodule -stable on CT, recommend follow up in 1 year  3. AAA (abdominal aortic aneurysm) without rupture (HCC) - Stable 4.2 cm ectatic ascending thoracic aorta. Followed by vascular.   4. Mixed  hyperlipidemia -states he has been compliant with zocor, will follow up lipids at next OV  5. Obesity (BMI 30-39.9) -noted, he has lost weight, encouraged ongoing weight loss with diet and exercise.   6. Essential hypertension -improved on recheck but ideally would like <140/90. Diet recommendations discussed. To check at home and bring blood pressure readings to follow up. Will continue losartan.   7. Hepatic steatosis Noted on CT, low fat diet encouraged   8. Need for influenza vaccination - Flu Vaccine QUAD 6+ mos PF IM (Fluarix Quad PF)  9. Atherosclerosis of native coronary artery of native heart without angina pectoris -noted on CT, discussed importance of dietary modifications, proper blood pressure and cholesterol control. Continues on ASA 81 mg daily  Next appt: 4 weeks for anxiety follow up and fasting labs.  Carlos American. [place], [facility] [phone]

## 2019-01-19 ENCOUNTER — Ambulatory Visit: Payer: 59

## 2019-02-04 ENCOUNTER — Ambulatory Visit: Payer: 59

## 2019-02-05 ENCOUNTER — Other Ambulatory Visit: Payer: Self-pay | Admitting: Nurse Practitioner

## 2019-02-05 DIAGNOSIS — F419 Anxiety disorder, unspecified: Secondary | ICD-10-CM

## 2019-02-12 ENCOUNTER — Other Ambulatory Visit: Payer: Self-pay | Admitting: Nurse Practitioner

## 2019-02-12 DIAGNOSIS — N529 Male erectile dysfunction, unspecified: Secondary | ICD-10-CM

## 2019-02-14 ENCOUNTER — Telehealth: Payer: Self-pay

## 2019-02-14 NOTE — Telephone Encounter (Signed)
Called made to patient to change 02/18/19 office visit to a virtual visit.  VM left to call the office.

## 2019-02-15 ENCOUNTER — Telehealth (INDEPENDENT_AMBULATORY_CARE_PROVIDER_SITE_OTHER): Payer: 59 | Admitting: Vascular Surgery

## 2019-02-15 ENCOUNTER — Other Ambulatory Visit: Payer: Self-pay

## 2019-02-15 DIAGNOSIS — I714 Abdominal aortic aneurysm, without rupture, unspecified: Secondary | ICD-10-CM

## 2019-02-15 DIAGNOSIS — Z95828 Presence of other vascular implants and grafts: Secondary | ICD-10-CM

## 2019-02-15 NOTE — Progress Notes (Signed)
Virtual Visit via Telephone Note  I connected with Aaron [name] on 02/15/19 at 11:00 AM EDT by telephone and verified that I am speaking with the correct person using two identifiers.   I discussed the limitations, risks, security and privacy concerns of performing an evaluation and management service by telephone and the availability of in person appointments. I also discussed with the patient that there may be a patient responsible charge related to this service. The patient expressed understanding and agreed to proceed.   History of Present Illness: 63 year old male status post EVAR in 2013 by Dr. Kellie Simmering for a 5.1 cm infrarenal aneurysm.  He was recently seen in follow-up for ongoing surveillance by the PA and there was concern for growth of the aneurysm given that the ultrasound measurements increased from 4.2 cm to 4.9 cm.  Limited visibility given overlying bowel gas.  A CTA was ultimately ordered to further evaluate.    Observations/Objective: Reports no new abdominal or back pain different since last visit.   Assessment and Plan: 63 year old male status post EVAR in 2013 by Dr. Kellie Simmering for 5.1 cm infrarenal aneurysm.  There was concern for growth of the aneurysm on recent surveillance duplex from 4.2 to 4.9 cm.  A CTA was obtained that confirmed the aneurysm sac continues to shrink with no evidence of endoleak.  On my measurement aneurysm has a maximal diameter of 3.7 cm and the graft is well-positioned below the renal arteries with both iliac limbs widely patent.   Follow Up Instructions: Follow-up in one year with another aneurysm duplex.   I discussed the assessment and treatment plan with the patient. The patient was provided an opportunity to ask questions and all were answered. The patient agreed with the plan and demonstrated an understanding of the instructions.   The patient was advised to call back or seek an in-person evaluation if the symptoms worsen or if the condition  fails to improve as anticipated.  I provided 10 minutes of non-face-to-face time during this encounter.   Marty Heck, MD

## 2019-02-16 ENCOUNTER — Telehealth: Payer: 59 | Admitting: Vascular Surgery

## 2019-02-16 NOTE — Progress Notes (Signed)
I am in the office and connected with Rudie Sermons in his car on his cell phone on 02/15/19 at 11:45 AM EDT by telephone and verified that I am speaking with the correct person using two identifiers.

## 2019-02-17 ENCOUNTER — Ambulatory Visit (INDEPENDENT_AMBULATORY_CARE_PROVIDER_SITE_OTHER): Payer: 59 | Admitting: Nurse Practitioner

## 2019-02-17 ENCOUNTER — Encounter: Payer: Self-pay | Admitting: Nurse Practitioner

## 2019-02-17 ENCOUNTER — Other Ambulatory Visit: Payer: Self-pay

## 2019-02-17 DIAGNOSIS — I10 Essential (primary) hypertension: Secondary | ICD-10-CM

## 2019-02-17 DIAGNOSIS — F419 Anxiety disorder, unspecified: Secondary | ICD-10-CM

## 2019-02-17 MED ORDER — ALPRAZOLAM 0.25 MG PO TABS: 0.2500 mg | ORAL_TABLET | Freq: Two times a day (BID) | ORAL | 0 refills | Status: DC | PRN

## 2019-02-17 NOTE — Progress Notes (Signed)
This service is provided via telemedicine  No vital signs collected/recorded due to the encounter was a telemedicine visit.   Location of patient (ex: home, work): on the road Traveling   Patient consents to a telephone visit:Yes  Location of the provider (ex: office, home): Office   Name of any referring provider:Conal Shetley,NP   Names of all persons participating in the telemedicine service and their role in the encounter:   Tiffany Phineas Semen ,NP,Reason Aaron [name](patient)  Time spent on call:  72min  Virtual Visit via Telephone Note  I connected with Aaron [name] on 02/17/19 at  3:15 PM EDT by telephone and verified that I am speaking with the correct person using two identifiers.   I discussed the limitations, risks, security and privacy concerns of performing an evaluation and management service by telephone and the availability of in person appointments. I also discussed with the patient that there may be a patient responsible charge related to this service. The patient expressed understanding and agreed to proceed.  Patient has a 4 week follow up with blood pressure and anxiety.Patient would like to discuss xanax dispense amount to see if he can a 30 day supply.     Careteam: [name], NP as PCP - General (Nurse Practitioner) [name], [name], NP as Nurse Practitioner (Vascular Surgery) [name], MD as Consulting Physician (General Surgery) Brand Males, MD as Consulting Physician (Pulmonary Disease) [name], MD as Consulting Physician (Cardiology) [name], PA-C as Physician Assistant (Dermatology)  Advanced Directive information Does Patient Have a Medical Advance Directive?: No, Would patient like information on creating a medical advance directive?: Yes (ED - Information included in AVS)  No Known Allergies  Chief Complaint  Patient presents with  . Medical Management of Chronic Issues     4 week follow up with anxiety and blood pressure      HPI: Patient is a 63 y.o. male to discuss HTN and anxiety  htn- blood pressure was elevated at last ov, states he has not really taken his blood pressure at home.   Last week he was in [place] and he is anxious over the fact that this county had a large case of COVID.   Started zoloft 50 mg 1 month ago, feels like he is a little better. No side effects noted. No HI or SI.  Reports xanax is helping him sleep because he sits and worries a lot at night.  Taking zoloft at night as well.   Reports he will take his blood pressure when he gets home and he is on the road. Trying to reduce sodium   Good report from vein and vascular. Relieved over this.   Review of Systems:  Review of Systems  Constitutional: Negative for chills and fever.  HENT: Negative for sore throat.   Respiratory: Negative for cough and shortness of breath.   Cardiovascular: Negative for chest pain.  Neurological: Negative for dizziness and headaches.  Psychiatric/Behavioral: Negative for depression. The patient is nervous/anxious and has insomnia.     Past Medical History:  Diagnosis Date  . Abdominal aortic aneurysm [facility])    sees Dr. Einar Gip for cardiac f/u, [phone]  . Arthritis    lower back  . Asthma    followed by Dr. Berdie Ogren  . Chronic airway obstruction, not elsewhere classified   . Dyspnea    normal PFT April 2012  . External hemorrhoids without mention of complication   . GERD (gastroesophageal reflux disease)   .  Gout   . H/O hiatal hernia   . Hemorrhoids   . HTN (hypertension)    hx of sees Dr. Graylin Shiver  . Hyperlipidemia   . Insomnia, unspecified   . Other abnormal blood chemistry   . Other dyspnea and respiratory abnormality   . Other malaise and fatigue   . Other specified erythematous condition(695.89)   . Other testicular dysfunction   . Prostatitis, unspecified   . Rash 2011   left chest, biopsy 2012 Dr. Rozann Lesches,  results pending  . Skin cancer   . Tobacco abuse    Past Surgical History:  Procedure Laterality Date  . ABDOMINAL AORTIC ENDOVASCULAR STENT GRAFT  09/22/2012   Procedure: ABDOMINAL AORTIC ENDOVASCULAR STENT GRAFT;  Surgeon: Mal Misty, MD;  Location: [place];  Service: Vascular;  Laterality: N/A;  GORE; ultrasound guided.  . APPENDECTOMY    . EVALUATION UNDER ANESTHESIA WITH HEMORRHOIDECTOMY N/A 12/31/2016   Procedure: EXAM UNDER ANESTHESIA WITH HEMORRHOIDECTOMY;  Surgeon: [name], MD;  Location: [place];  Service: General;  Laterality: N/A;  . SKIN CANCER EXCISION    . TOE SURGERY  01/2012   joint of left great toe  . TONSILLECTOMY    . TONSILLECTOMY    . TYMPANOPLASTY    . WRIST SURGERY     left   Social History:   reports that he quit smoking about 6 years ago. His smoking use included cigarettes. He has a 9.50 pack-year smoking history. He has quit using smokeless tobacco.  His smokeless tobacco use included snuff. He reports that he does not drink alcohol or use drugs.  Family History  Problem Relation Age of Onset  . Heart attack Father 39  . COPD Father   . Heart disease Father   . Asthma Daughter   . Cancer Mother        Breast  . Lupus Daughter   . Diabetes Other        Grandson     Medications: Patient's Medications  New Prescriptions   No medications on file  Previous Medications   ALBUTEROL (PROVENTIL HFA;VENTOLIN HFA) 108 (90 BASE) MCG/ACT INHALER    Inhale 2 puffs into the lungs every 6 (six) hours as needed. For cough and wheezing.   ALPRAZOLAM (XANAX) 0.25 MG TABLET    Take 1 tablet (0.25 mg total) by mouth 2 (two) times daily as needed for anxiety.   ASPIRIN EC 81 MG TABLET    Take 81 mg by mouth daily.   BREO ELLIPTA 200-25 MCG/INH AEPB    TAKE 1 PUFF BY MOUTH EVERY DAY   LOSARTAN (COZAAR) 50 MG TABLET    TAKE 1 TABLET BY MOUTH ONCE DAILY TO CONTROL BLOOD PRESSURE   PANTOPRAZOLE (PROTONIX) 40 MG TABLET    TAKE 1 TABLET BY  MOUTH EVERY DAY   SERTRALINE (ZOLOFT) 50 MG TABLET    TAKE 1 TABLET BY MOUTH EVERY DAY   SILDENAFIL (VIAGRA) 50 MG TABLET    TAKE 1 TABLET BY MOUTH DAILY AS NEEDED FOR ERECTILE DYSFUNCTION   SIMVASTATIN (ZOCOR) 20 MG TABLET    Take 1 tablet (20 mg total) by mouth at bedtime.  Modified Medications   No medications on file  Discontinued Medications   No medications on file     Labs reviewed: Basic Metabolic Panel: Recent Labs    08/26/18 0812  NA 139  K 4.2  CL 105  CO2 26  GLUCOSE 100*  BUN 15  CREATININE 0.94  CALCIUM  9.3   Liver Function Tests: Recent Labs    08/26/18 0812  AST 25  ALT 27  BILITOT 0.6  PROT 6.6   No results for input(s): LIPASE, AMYLASE in the last 8760 hours. No results for input(s): AMMONIA in the last 8760 hours. CBC: Recent Labs    08/26/18 0812  WBC 6.3  NEUTROABS 4,001  HGB 15.0  HCT 45.0  MCV 90.0  PLT 253   Lipid Panel: Recent Labs    08/26/18 0812  CHOL 138  HDL 40*  LDLCALC 83  TRIG 66  CHOLHDL 3.5   TSH: No results for input(s): TSH in the last 8760 hours. A1C: Lab Results  Component Value Date   HGBA1C 6.0 (H) 08/09/2015     Assessment/Plan 1. Anxiety -improving on zoloft. Feels like due to recent travels his anxiety is worse but feels like once he has been home for 14 days this will improve. Requesting additional refill of xanax to get him through this.  - ALPRAZolam (XANAX) 0.25 MG tablet; Take 1 tablet (0.25 mg total) by mouth 2 (two) times daily as needed for anxiety.  Dispense: 20 tablet; Refill: 0  2. Essential hypertension -continues on losartan 50 mg daily with diet modifications.  On the road currently but plans to take blood pressure when he gets home and will call back to let us know what the reading is.  Continue medication with lifestyle modifications.    Carlos [employer]. Harle Battiest  [facility] [phone]    Follow Up Instructions:    I discussed the assessment  and treatment plan with the patient. The patient was provided an opportunity to ask questions and all were answered. The patient agreed with the plan and demonstrated an understanding of the instructions.   The patient was advised to call back or seek an in-person evaluation if the symptoms worsen or if the condition fails to improve as anticipated.  I provided 12 minutes of non-face-to-face time during this encounter.

## 2019-02-18 ENCOUNTER — Ambulatory Visit: Payer: 59 | Admitting: Nurse Practitioner

## 2019-02-22 ENCOUNTER — Other Ambulatory Visit: Payer: Self-pay | Admitting: Nurse Practitioner

## 2019-02-22 DIAGNOSIS — I10 Essential (primary) hypertension: Secondary | ICD-10-CM

## 2019-02-23 ENCOUNTER — Other Ambulatory Visit: Payer: Self-pay | Admitting: Nurse Practitioner

## 2019-02-23 DIAGNOSIS — E782 Mixed hyperlipidemia: Secondary | ICD-10-CM

## 2019-03-02 ENCOUNTER — Ambulatory Visit (INDEPENDENT_AMBULATORY_CARE_PROVIDER_SITE_OTHER): Payer: 59 | Admitting: Nurse Practitioner

## 2019-03-02 ENCOUNTER — Encounter: Payer: Self-pay | Admitting: Nurse Practitioner

## 2019-03-02 ENCOUNTER — Other Ambulatory Visit: Payer: Self-pay

## 2019-03-02 DIAGNOSIS — J4520 Mild intermittent asthma, uncomplicated: Secondary | ICD-10-CM | POA: Diagnosis not present

## 2019-03-02 DIAGNOSIS — Z125 Encounter for screening for malignant neoplasm of prostate: Secondary | ICD-10-CM | POA: Diagnosis not present

## 2019-03-02 DIAGNOSIS — F419 Anxiety disorder, unspecified: Secondary | ICD-10-CM

## 2019-03-02 DIAGNOSIS — E669 Obesity, unspecified: Secondary | ICD-10-CM

## 2019-03-02 DIAGNOSIS — K219 Gastro-esophageal reflux disease without esophagitis: Secondary | ICD-10-CM

## 2019-03-02 DIAGNOSIS — I10 Essential (primary) hypertension: Secondary | ICD-10-CM

## 2019-03-02 DIAGNOSIS — E782 Mixed hyperlipidemia: Secondary | ICD-10-CM

## 2019-03-02 MED ORDER — SERTRALINE HCL 100 MG PO TABS: 100.0000 mg | ORAL_TABLET | Freq: Every day | ORAL | 1 refills | Status: DC

## 2019-03-02 NOTE — Patient Instructions (Addendum)
To increase zoloft to 1.5 tablets daily for 1 week- if no increase in side effects then increase to 2 tablet for ZOLOFT 100 mg daily  Exercise is beneficial for weight and stress. Recommend 30 mins 5 days a week.  If needed counseling/therapy is beneficial for anxiety    Let us know about your blood pressure GOAL <140/90   DASH Eating Plan DASH stands for "Dietary Approaches to Stop Hypertension." The DASH eating plan is a healthy eating plan that has been shown to reduce high blood pressure (hypertension). It may also reduce your risk for type 2 diabetes, heart disease, and stroke. The DASH eating plan may also help with weight loss. What are tips for following this plan?  General guidelines  Avoid eating more than 2,300 mg (milligrams) of salt (sodium) a day. If you have hypertension, you may need to reduce your sodium intake to 1,500 mg a day.  Limit alcohol intake to no more than 1 drink a day for nonpregnant women and 2 drinks a day for men. One drink equals 12 oz of beer, 5 oz of wine, or 1 oz of hard liquor.  Work with your health care provider to maintain a healthy body weight or to lose weight. Ask what an ideal weight is for you.  Get at least 30 minutes of exercise that causes your heart to beat faster (aerobic exercise) most days of the week. Activities may include walking, swimming, or biking.  Work with your health care provider or diet and nutrition specialist (dietitian) to adjust your eating plan to your individual calorie needs. Reading food labels   Check food labels for the amount of sodium per serving. Choose foods with less than 5 percent of the Daily Value of sodium. Generally, foods with less than 300 mg of sodium per serving fit into this eating plan.  To find whole grains, look for the word "whole" as the first word in the ingredient list. Shopping  Buy products labeled as "low-sodium" or "no salt added."  Buy fresh foods. Avoid canned foods and premade  or frozen meals. Cooking  Avoid adding salt when cooking. Use salt-free seasonings or herbs instead of table salt or sea salt. Check with your health care provider or pharmacist before using salt substitutes.  Do not fry foods. Cook foods using healthy methods such as baking, boiling, grilling, and broiling instead.  Cook with heart-healthy oils, such as olive, canola, soybean, or sunflower oil. Meal planning  Eat a balanced diet that includes: ? 5 or more servings of fruits and vegetables each day. At each meal, try to fill half of your plate with fruits and vegetables. ? Up to 6-8 servings of whole grains each day. ? Less than 6 oz of lean meat, poultry, or fish each day. A 3-oz serving of meat is about the same size as a deck of cards. One egg equals 1 oz. ? 2 servings of low-fat dairy each day. ? A serving of nuts, seeds, or beans 5 times each week. ? Heart-healthy fats. Healthy fats called Omega-3 fatty acids are found in foods such as flaxseeds and coldwater fish, like sardines, salmon, and mackerel.  Limit how much you eat of the following: ? Canned or prepackaged foods. ? Food that is high in trans fat, such as fried foods. ? Food that is high in saturated fat, such as fatty meat. ? Sweets, desserts, sugary drinks, and other foods with added sugar. ? Full-fat dairy products.  Do not salt foods  before eating.  Try to eat at least 2 vegetarian meals each week.  Eat more home-cooked food and less restaurant, buffet, and fast food.  When eating at a restaurant, ask that your food be prepared with less salt or no salt, if possible. What foods are recommended? The items listed may not be a complete list. Talk with your dietitian about what dietary choices are best for you. Grains Whole-grain or whole-wheat bread. Whole-grain or whole-wheat pasta. Brown rice. [place] Morrow. Bulgur. Whole-grain and low-sodium cereals. Pita bread. Low-fat, low-sodium crackers. Whole-wheat flour  tortillas. Vegetables Fresh or frozen vegetables (raw, steamed, roasted, or grilled). Low-sodium or reduced-sodium tomato and vegetable juice. Low-sodium or reduced-sodium tomato sauce and tomato paste. Low-sodium or reduced-sodium canned vegetables. Fruits All fresh, dried, or frozen fruit. Canned fruit in natural juice (without added sugar). Meat and other protein foods Skinless chicken or [place]. Ground chicken or [place]. Pork with fat trimmed off. Fish and seafood. Egg whites. Dried beans, peas, or lentils. Unsalted nuts, nut butters, and seeds. Unsalted canned beans. Lean cuts of beef with fat trimmed off. Low-sodium, lean deli meat. Dairy Low-fat (1%) or fat-free (skim) milk. Fat-free, low-fat, or reduced-fat cheeses. Nonfat, low-sodium ricotta or cottage cheese. Low-fat or nonfat yogurt. Low-fat, low-sodium cheese. Fats and oils Soft margarine without trans fats. Vegetable oil. Low-fat, reduced-fat, or light mayonnaise and salad dressings (reduced-sodium). Canola, safflower, olive, soybean, and sunflower oils. Avocado. Seasoning and other foods Herbs. Spices. Seasoning mixes without salt. Unsalted popcorn and pretzels. Fat-free sweets. What foods are not recommended? The items listed may not be a complete list. Talk with your dietitian about what dietary choices are best for you. Grains Baked goods made with fat, such as croissants, muffins, or some breads. Dry pasta or rice meal packs. Vegetables Creamed or fried vegetables. Vegetables in a cheese sauce. Regular canned vegetables (not low-sodium or reduced-sodium). Regular canned tomato sauce and paste (not low-sodium or reduced-sodium). Regular tomato and vegetable juice (not low-sodium or reduced-sodium). Angie Fava. Olives. Fruits Canned fruit in a light or heavy syrup. Fried fruit. Fruit in cream or butter sauce. Meat and other protein foods Fatty cuts of meat. Ribs. Fried meat. Berniece Salines. Sausage. Bologna and other processed lunch meats.  Salami. Fatback. Hotdogs. Bratwurst. Salted nuts and seeds. Canned beans with added salt. Canned or smoked fish. Whole eggs or egg yolks. Chicken or [place] with skin. Dairy Whole or 2% milk, cream, and half-and-half. Whole or full-fat cream cheese. Whole-fat or sweetened yogurt. Full-fat cheese. Nondairy creamers. Whipped toppings. Processed cheese and cheese spreads. Fats and oils Butter. Stick margarine. Lard. Shortening. Ghee. Bacon fat. Tropical oils, such as coconut, palm kernel, or palm oil. Seasoning and other foods Salted popcorn and pretzels. Onion salt, garlic salt, seasoned salt, table salt, and sea salt. Worcestershire sauce. Tartar sauce. Barbecue sauce. Teriyaki sauce. Soy sauce, including reduced-sodium. Steak sauce. Canned and packaged gravies. Fish sauce. Oyster sauce. Cocktail sauce. Horseradish that you find on the shelf. Ketchup. Mustard. Meat flavorings and tenderizers. Bouillon cubes. Hot sauce and Tabasco sauce. Premade or packaged marinades. Premade or packaged taco seasonings. Relishes. Regular salad dressings. Where to find more information:  National Heart, Lung, and [place]: https://wilson-eaton.com/  American Heart Association: www.heart.org Summary  The DASH eating plan is a healthy eating plan that has been shown to reduce high blood pressure (hypertension). It may also reduce your risk for type 2 diabetes, heart disease, and stroke.  With the DASH eating plan, you should limit salt (sodium) intake to 2,300 mg  a day. If you have hypertension, you may need to reduce your sodium intake to 1,500 mg a day.  When on the DASH eating plan, aim to eat more fresh fruits and vegetables, whole grains, lean proteins, low-fat dairy, and heart-healthy fats.  Work with your health care provider or diet and nutrition specialist (dietitian) to adjust your eating plan to your individual calorie needs. This information is not intended to replace advice given to you by your health  care provider. Make sure you discuss any questions you have with your health care provider. Document Released: 10/30/2011 Document Revised: 11/03/2016 Document Reviewed: 11/03/2016 Elsevier Interactive Patient Education  2019 [employer].

## 2019-03-02 NOTE — Progress Notes (Signed)
This service is provided via telemedicine  No vital signs collected/recorded due to the encounter was a telemedicine visit.   Location of patient (ex: home, work):  [place]  Patient consents to a telephone visit:  YES  Location of the provider (ex: office, home):  [employer], office  Names of all persons participating in the telemedicine service and their role in the encounter:  Leafy Half, CMA, Sherrie Mustache, NP and patient Aaron [name]  Time spent on call:  7 mins  Virtual Visit via Telephone Note  I connected with Aaron [name] on 03/02/19 at  3:15 PM EDT by telephone and verified that I am speaking with the correct person using two identifiers.   I discussed the limitations, risks, security and privacy concerns of performing an evaluation and management service by telephone and the availability of in person appointments. I also discussed with the patient that there may be a patient responsible charge related to this service. The patient expressed understanding and agreed to proceed.      Careteam: [name], NP as PCP - General (Nurse Practitioner) [name], [name], NP as Nurse Practitioner (Vascular Surgery) [name], MD as Consulting Physician (General Surgery) Brand Males, MD as Consulting Physician (Pulmonary Disease) [name], MD as Consulting Physician (Cardiology) [name], PA-C as Physician Assistant (Dermatology)  Advanced Directive information    No Known Allergies  Chief Complaint  Patient presents with  . Medical Management of Chronic Issues    Follow up. Patient would like to discuss the sertraline. No refills needed.     HPI: Patient is a 63 y.o. male for routine follow up  Anxiety- taking zoloft daily, reports his wife states he is still is "aggressive" and short tempered. He does not feel like it is as much of an issue. arguing with wife a lot. Stress due to house in [place]  (uncle died and having to sell his house). Lots of stress. Continue on xanax PRN.   GERD- on protonix which controls GERD.   Hyperlipidemia- continues on zocor. LDL 83 in oct 2019, has made dietary modifications.   HTN- continues on cozaar, still has not checked bp at home.     Review of Systems:  Review of Systems  Constitutional: Negative for chills and fever.  HENT: Negative for sore throat.   Respiratory: Negative for cough and shortness of breath.   Cardiovascular: Negative for chest pain.  Gastrointestinal: Positive for diarrhea (frequent stools in the am). Negative for abdominal pain, constipation, heartburn and vomiting.  Genitourinary: Negative for frequency.  Musculoskeletal: Negative for myalgias.  Skin: Negative for itching and rash.  Neurological: Negative for dizziness and headaches.  Psychiatric/Behavioral: Negative for depression. The patient is nervous/anxious and has insomnia.     Past Medical History:  Diagnosis Date  . Abdominal aortic aneurysm [facility])    sees Dr. Einar Gip for cardiac f/u, [phone]  . Arthritis    lower back  . Asthma    followed by Dr. Berdie Ogren  . Chronic airway obstruction, not elsewhere classified   . Dyspnea    normal PFT April 2012  . External hemorrhoids without mention of complication   . GERD (gastroesophageal reflux disease)   . Gout   . H/O hiatal hernia   . Hemorrhoids   . HTN (hypertension)    hx of sees Dr. Graylin Shiver  . Hyperlipidemia   . Insomnia, unspecified   . Other abnormal blood chemistry   . Other dyspnea and respiratory  abnormality   . Other malaise and fatigue   . Other specified erythematous condition(695.89)   . Other testicular dysfunction   . Prostatitis, unspecified   . Rash 2011   left chest, biopsy 2012 Dr. Rozann Lesches, results pending  . Skin cancer   . Tobacco abuse    Past Surgical History:  Procedure Laterality Date  . ABDOMINAL AORTIC ENDOVASCULAR STENT GRAFT  09/22/2012   Procedure:  ABDOMINAL AORTIC ENDOVASCULAR STENT GRAFT;  Surgeon: Mal Misty, MD;  Location: [facility];  Service: Vascular;  Laterality: N/A;  GORE; ultrasound guided.  . APPENDECTOMY    . EVALUATION UNDER ANESTHESIA WITH HEMORRHOIDECTOMY N/A 12/31/2016   Procedure: EXAM UNDER ANESTHESIA WITH HEMORRHOIDECTOMY;  Surgeon: [name], MD;  Location: [facility];  Service: General;  Laterality: N/A;  . SKIN CANCER EXCISION    . TOE SURGERY  01/2012   joint of left great toe  . TONSILLECTOMY    . TONSILLECTOMY    . TYMPANOPLASTY    . WRIST SURGERY     left   Social History:   reports that he quit smoking about 6 years ago. His smoking use included cigarettes. He has a 9.50 pack-year smoking history. He has quit using smokeless tobacco.  His smokeless tobacco use included snuff. He reports that he does not drink alcohol or use drugs.  Family History  Problem Relation Age of Onset  . Heart attack Father 48  . COPD Father   . Heart disease Father   . Asthma Daughter   . Cancer Mother        Breast  . Lupus Daughter   . Diabetes Other        Grandson     Medications: Patient's Medications  New Prescriptions   No medications on file  Previous Medications   ALBUTEROL (PROVENTIL HFA;VENTOLIN HFA) 108 (90 BASE) MCG/ACT INHALER    Inhale 2 puffs into the lungs every 6 (six) hours as needed. For cough and wheezing.   ALPRAZOLAM (XANAX) 0.25 MG TABLET    Take 1 tablet (0.25 mg total) by mouth 2 (two) times daily as needed for anxiety.   ASPIRIN EC 81 MG TABLET    Take 81 mg by mouth daily.   BREO ELLIPTA 200-25 MCG/INH AEPB    TAKE 1 PUFF BY MOUTH EVERY DAY   LOSARTAN (COZAAR) 25 MG TABLET    TAKE 2 TABLETS BY MOUTH ONCE DAILY TO CONTROL BLOOD PRESSURE (50MG  TABLETS ON BACK ORDER)   PANTOPRAZOLE (PROTONIX) 40 MG TABLET    TAKE 1 TABLET BY MOUTH EVERY DAY   SERTRALINE (ZOLOFT) 50 MG TABLET    TAKE 1 TABLET BY MOUTH EVERY DAY   SILDENAFIL (VIAGRA) 50 MG TABLET    TAKE 1 TABLET BY MOUTH  DAILY AS NEEDED FOR ERECTILE DYSFUNCTION   SIMVASTATIN (ZOCOR) 20 MG TABLET    TAKE 1 TABLET BY MOUTH EVERYDAY AT BEDTIME  Modified Medications   No medications on file  Discontinued Medications   No medications on file    Labs reviewed: Basic Metabolic Panel: Recent Labs    08/26/18 0812  NA 139  K 4.2  CL 105  CO2 26  GLUCOSE 100*  BUN 15  CREATININE 0.94  CALCIUM 9.3   Liver Function Tests: Recent Labs    08/26/18 0812  AST 25  ALT 27  BILITOT 0.6  PROT 6.6   No results for input(s): LIPASE, AMYLASE in the last 8760 hours. No results for input(s): AMMONIA in  the last 8760 hours. CBC: Recent Labs    08/26/18 0812  WBC 6.3  NEUTROABS 4,001  HGB 15.0  HCT 45.0  MCV 90.0  PLT 253   Lipid Panel: Recent Labs    08/26/18 0812  CHOL 138  HDL 40*  LDLCALC 83  TRIG 66  CHOLHDL 3.5   TSH: No results for input(s): TSH in the last 8760 hours. A1C: Lab Results  Component Value Date   HGBA1C 6.0 (H) 08/09/2015     Assessment/Plan 1. Mild intermittent asthma without complication Controlled with breo.   2. Anxiety Improved but Ongoing. Will increase zoloft to 75 mg by mouth daily for 1 week then to increase to 100 mg daily.  - sertraline (ZOLOFT) 100 MG tablet; Take 1 tablet (100 mg total) by mouth daily.  Dispense: 30 tablet; Refill: 1  3. Obesity (BMI 30-39.9) -has lost weight due to dietary modifications. Exercise also encouraged 30 mins 5 days a week as tolerates.   4. Prostate cancer screening - PSA; Future  5. Gastroesophageal reflux disease, esophagitis presence not specified Controlled on protonix, also encouraged to maintain lifestyle modifications.   6. Mixed hyperlipidemia -encouraged diet modifications -continues on zoloft - Lipid Panel; Future - COMPLETE METABOLIC PANEL WITH GFR; Future  7. Essential hypertension Has not taken blood pressure at home. Diet modifications encouraged. Continues on losartan.  - CBC with  Differential/Platelet; Future  Next appt: 06/20/2019 [facility] [employer]. Harle Battiest  [facility] [phone]   Follow Up Instructions:    I discussed the assessment and treatment plan with the patient. The patient was provided an opportunity to ask questions and all were answered. The patient agreed with the plan and demonstrated an understanding of the instructions.   The patient was advised to call back or seek an in-person evaluation if the symptoms worsen or if the condition fails to improve as anticipated.  I provided 13  minutes of non-face-to-face time during this encounter.   Sherrie Mustache, NP

## 2019-03-03 ENCOUNTER — Telehealth: Payer: Self-pay | Admitting: *Deleted

## 2019-03-03 NOTE — Telephone Encounter (Signed)
Yesterday he was wanting to increase it. There are sexual side effects that can be noted. We can try to change his medication if he would like. Otherwise to decrease to 1/2 tablet for 1 week then stop. He can go to a cognitive behavioral therapist for lifestyle modifications if he wants to try something other than medication for anxiety.

## 2019-03-03 NOTE — Telephone Encounter (Signed)
LMOM to return call.

## 2019-03-03 NOTE — Telephone Encounter (Signed)
Patient called and stated that he has a TeleVisit with you yesterday. Patient is wanting to wean off of the Zoloft.Stated he did take 1 1/2 tablet last night.  Stated that he is having side effects of diarrhea and erection/ejaculation difficulties. Wants to come off of it. Please Advise.

## 2019-03-07 NOTE — Telephone Encounter (Signed)
Patient notified and agreed.  

## 2019-04-05 ENCOUNTER — Telehealth: Payer: Self-pay | Admitting: *Deleted

## 2019-04-05 NOTE — Telephone Encounter (Signed)
Due to COVID-19 office visit here and to specialist are very limited at this time. Is he having any other symptoms, pain, fever, congestion, etc? If it is still bothering him next week we can possibly see him in office then to evaluate hopefully restrictions will have eased up at that time.

## 2019-04-05 NOTE — Telephone Encounter (Signed)
Patient called and stated that he was calling you with a couple of BP readings.  Last week: 117/87 04-02-2019- 127/83 Pulse 87  Patient is also complaining of not being able to hear out of Left ear. Wife has been putting wax removal drops in it but not working. Patient is wondering if he needs to come into office to be seen or if he needs a referral to ENT. Please Advise.

## 2019-04-06 NOTE — Telephone Encounter (Signed)
Patient notified. Stated that he is not having any pain, fever or congestion. Stated that it feels better this morning. Will call back with any new symptoms or worsens.

## 2019-05-12 ENCOUNTER — Other Ambulatory Visit: Payer: Self-pay | Admitting: *Deleted

## 2019-05-12 DIAGNOSIS — F419 Anxiety disorder, unspecified: Secondary | ICD-10-CM

## 2019-05-12 MED ORDER — ALPRAZOLAM 0.25 MG PO TABS: 0.2500 mg | ORAL_TABLET | Freq: Two times a day (BID) | ORAL | 0 refills | Status: DC | PRN

## 2019-05-12 NOTE — Telephone Encounter (Signed)
Pt calling for a refill,  Database checked and verified

## 2019-06-05 ENCOUNTER — Other Ambulatory Visit: Payer: Self-pay | Admitting: Nurse Practitioner

## 2019-06-05 DIAGNOSIS — F419 Anxiety disorder, unspecified: Secondary | ICD-10-CM

## 2019-06-06 NOTE — Telephone Encounter (Signed)
Patient requesting refill for sertraline. High

## 2019-06-20 ENCOUNTER — Ambulatory Visit: Payer: 59 | Admitting: Nurse Practitioner

## 2019-06-25 ENCOUNTER — Other Ambulatory Visit: Payer: Self-pay | Admitting: Nurse Practitioner

## 2019-06-25 DIAGNOSIS — K219 Gastro-esophageal reflux disease without esophagitis: Secondary | ICD-10-CM

## 2019-07-10 ENCOUNTER — Other Ambulatory Visit: Payer: Self-pay | Admitting: Nurse Practitioner

## 2019-07-10 DIAGNOSIS — J4540 Moderate persistent asthma, uncomplicated: Secondary | ICD-10-CM

## 2019-08-12 ENCOUNTER — Encounter (HOSPITAL_BASED_OUTPATIENT_CLINIC_OR_DEPARTMENT_OTHER): Payer: Self-pay | Admitting: Emergency Medicine

## 2019-08-12 ENCOUNTER — Other Ambulatory Visit: Payer: Self-pay

## 2019-08-12 ENCOUNTER — Emergency Department (HOSPITAL_BASED_OUTPATIENT_CLINIC_OR_DEPARTMENT_OTHER): Payer: 59

## 2019-08-12 ENCOUNTER — Emergency Department (HOSPITAL_BASED_OUTPATIENT_CLINIC_OR_DEPARTMENT_OTHER)
Admission: EM | Admit: 2019-08-12 | Discharge: 2019-08-12 | Disposition: A | Discharge status: Home / Self Care | Payer: 59 | Attending: Emergency Medicine | Admitting: Emergency Medicine

## 2019-08-12 DIAGNOSIS — J45909 Unspecified asthma, uncomplicated: Secondary | ICD-10-CM | POA: Insufficient documentation

## 2019-08-12 DIAGNOSIS — R1084 Generalized abdominal pain: Secondary | ICD-10-CM | POA: Diagnosis present

## 2019-08-12 DIAGNOSIS — Z7982 Long term (current) use of aspirin: Secondary | ICD-10-CM | POA: Insufficient documentation

## 2019-08-12 DIAGNOSIS — I10 Essential (primary) hypertension: Secondary | ICD-10-CM | POA: Insufficient documentation

## 2019-08-12 DIAGNOSIS — R140 Abdominal distension (gaseous): Secondary | ICD-10-CM | POA: Insufficient documentation

## 2019-08-12 DIAGNOSIS — F17210 Nicotine dependence, cigarettes, uncomplicated: Secondary | ICD-10-CM | POA: Insufficient documentation

## 2019-08-12 DIAGNOSIS — Z79899 Other long term (current) drug therapy: Secondary | ICD-10-CM | POA: Insufficient documentation

## 2019-08-12 DIAGNOSIS — E785 Hyperlipidemia, unspecified: Secondary | ICD-10-CM | POA: Insufficient documentation

## 2019-08-12 LAB — URINALYSIS, ROUTINE W REFLEX MICROSCOPIC
Bilirubin Urine: NEGATIVE
Glucose, UA: NEGATIVE mg/dL
Hgb urine dipstick: NEGATIVE
Ketones, ur: NEGATIVE mg/dL
Leukocytes,Ua: NEGATIVE
Nitrite: NEGATIVE
Protein, ur: NEGATIVE mg/dL
Specific Gravity, Urine: 1.015 (ref 1.005–1.030)
pH: 6.5 (ref 5.0–8.0)

## 2019-08-12 LAB — COMPREHENSIVE METABOLIC PANEL
ALT: 34 U/L (ref 0–44)
AST: 26 U/L (ref 15–41)
Albumin: 4.2 g/dL (ref 3.5–5.0)
Alkaline Phosphatase: 79 U/L (ref 38–126)
Anion gap: 11 (ref 5–15)
BUN: 7 mg/dL (ref 6–20)
CO2: 27 mmol/L (ref 22–32)
Calcium: 9.2 mg/dL (ref 8.9–10.3)
Chloride: 102 mmol/L (ref 98–111)
Creatinine, Ser: 0.89 mg/dL (ref 0.61–1.24)
GFR calc Af Amer: >60 mL/min (ref >60)
GFR calc non Af Amer: >60 mL/min (ref >60)
Glucose, Bld: 93 mg/dL (ref 70–99)
Potassium: 3.7 mmol/L (ref 3.5–5.1)
Sodium: 140 mmol/L (ref 135–145)
Total Bilirubin: 0.8 mg/dL (ref 0.3–1.2)
Total Protein: 7.2 g/dL (ref 6.5–8.1)

## 2019-08-12 LAB — CBC
HCT: 47.6 % (ref 39.0–52.0)
Hemoglobin: 15.7 g/dL (ref 13.0–17.0)
MCH: 30.1 pg (ref 26.0–34.0)
MCHC: 33 g/dL (ref 30.0–36.0)
MCV: 91.2 fL (ref 80.0–100.0)
Platelets: 239 10*3/uL (ref 150–400)
RBC: 5.22 MIL/uL (ref 4.22–5.81)
RDW: 12.7 % (ref 11.5–15.5)
WBC: 9.3 10*3/uL (ref 4.0–10.5)
nRBC: 0 % (ref 0.0–0.2)

## 2019-08-12 LAB — LIPASE, BLOOD: Lipase: 22 U/L (ref 11–51)

## 2019-08-12 IMAGING — CT CT CTA ABD/PEL W/CM AND/OR W/O CM
2 of 12 series · 12 of 46 positions shown, 17 images · IV contrast (omnipaque)
Comparison: CT [DATE], [DATE], [DATE]

CLINICAL DATA: Abdominal pain and bloating for 3 days

EXAM:
CTA ABDOMEN AND PELVIS wITHOUT AND WITH CONTRAST
TECHNIQUE: Multidetector CT imaging of the abdomen and pelvis was performed
using the standard protocol during bolus administration of
intravenous contrast. Multiplanar reconstructed images and MIPs were
obtained and reviewed to evaluate the vascular anatomy.
CONTRAST:  100mL OMNIPAQUE IOHEXOL 350 MG/ML SOLN

[Series 5: axial arterial · axial · arterial · 0.87mm/px · z∈[-409,+23]mm · 10 of 169 slices shown, 15 images]
[im 13/169  soft-tissue]
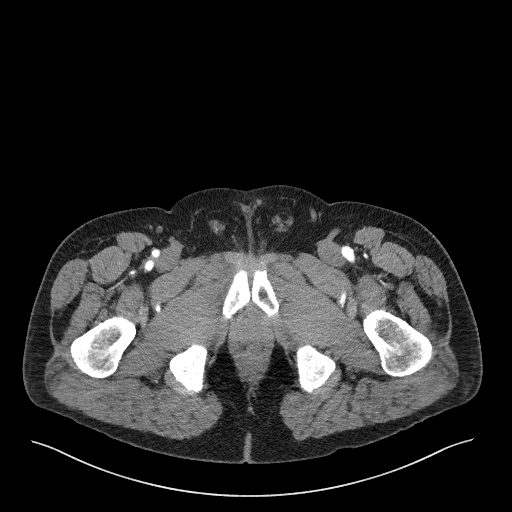
[im 13/169  bone]
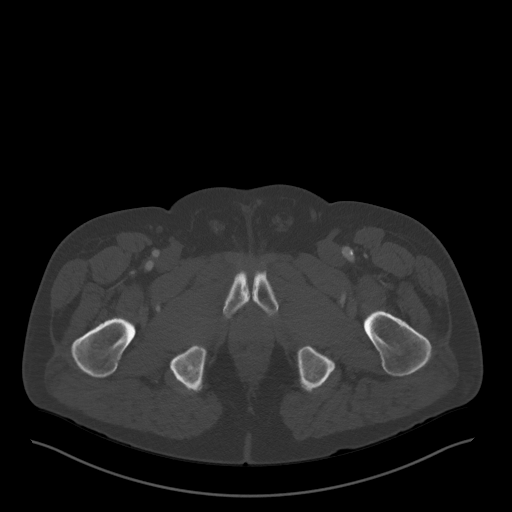
[im 37/169  soft-tissue]
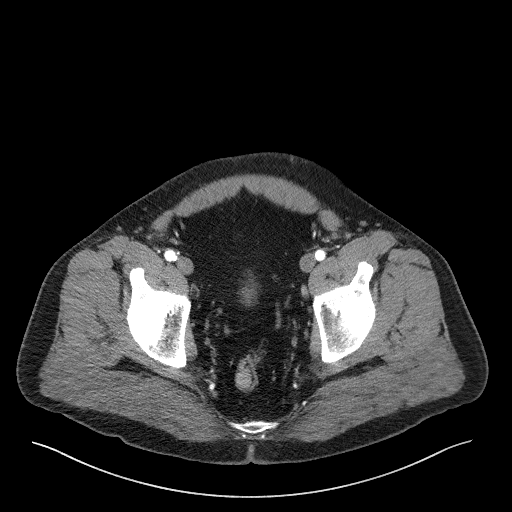
[im 49/169  soft-tissue]
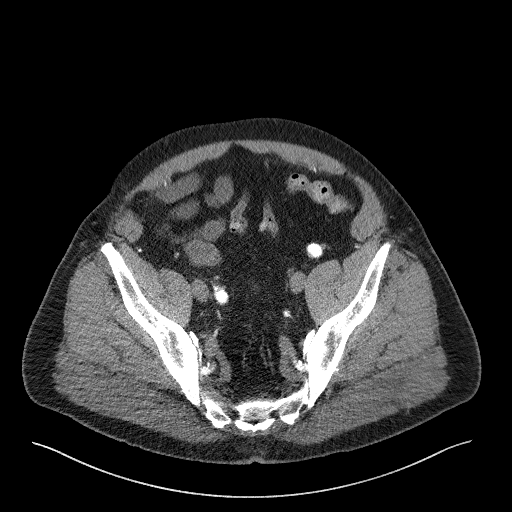
[im 73/169  soft-tissue]
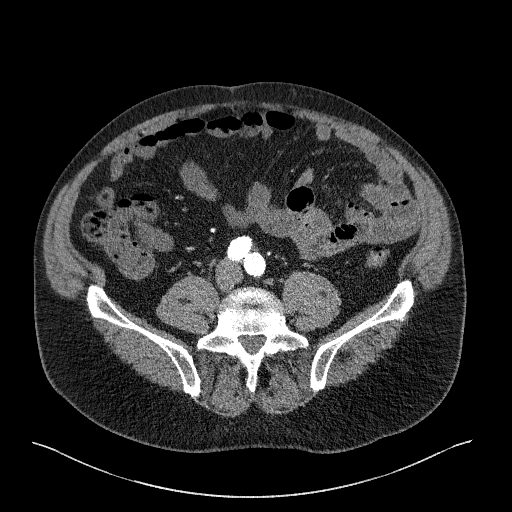
[im 85/169  soft-tissue]
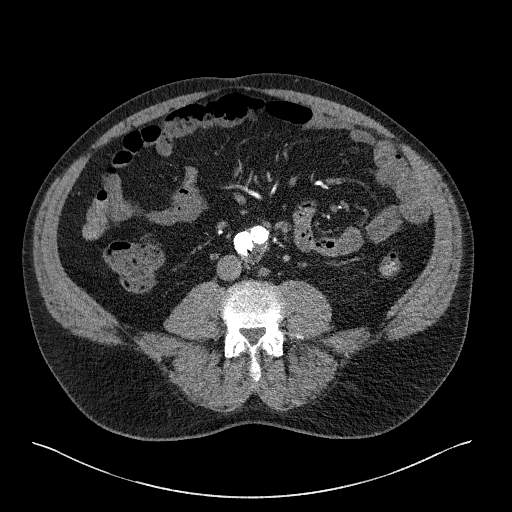
[im 97/169  soft-tissue]
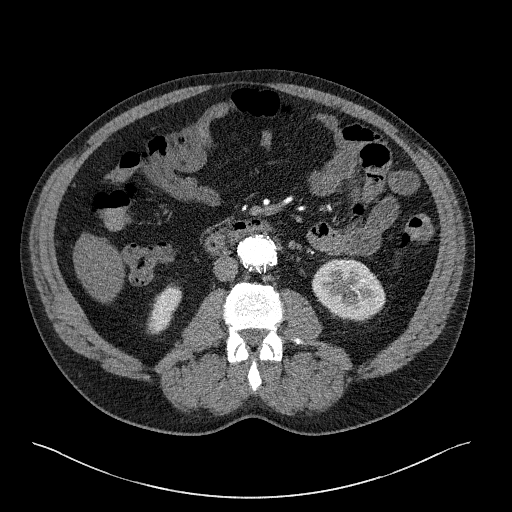
[im 121/169  soft-tissue]
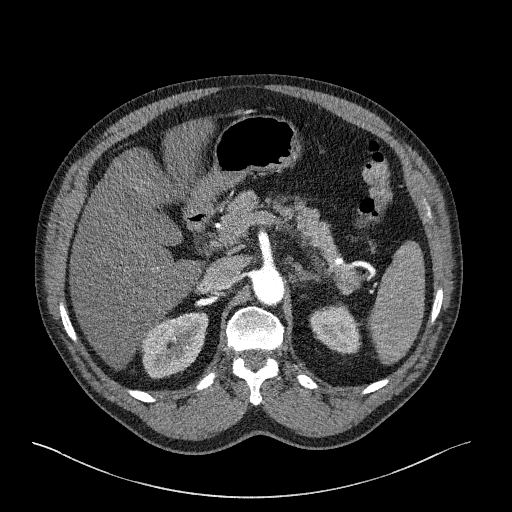
[im 121/169  lung]
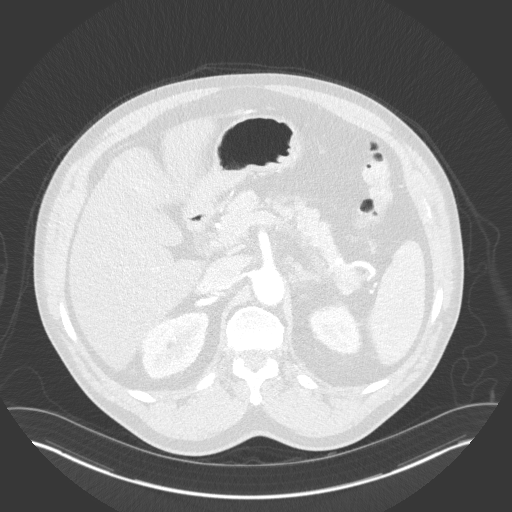
[im 133/169  soft-tissue]
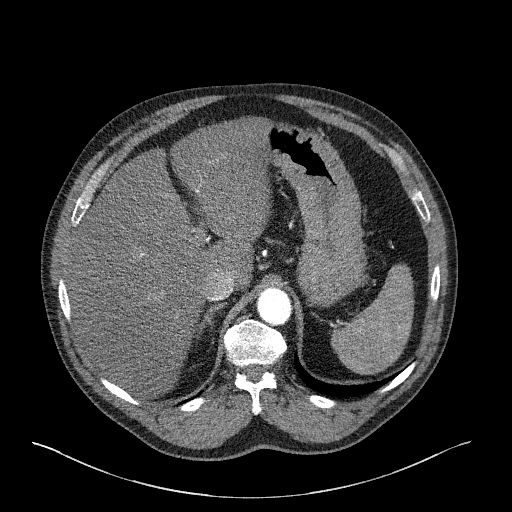
[im 133/169  lung]
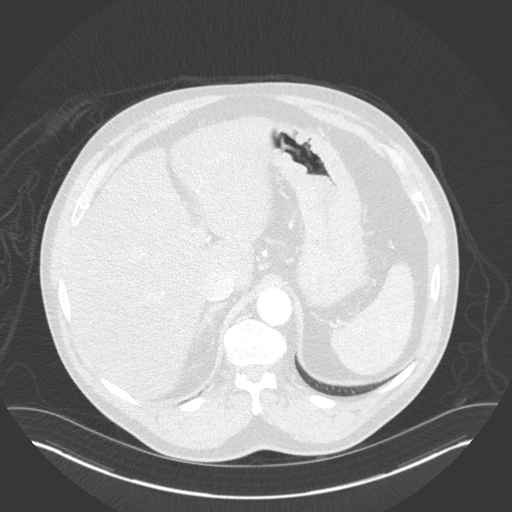
[im 145/169  lung]
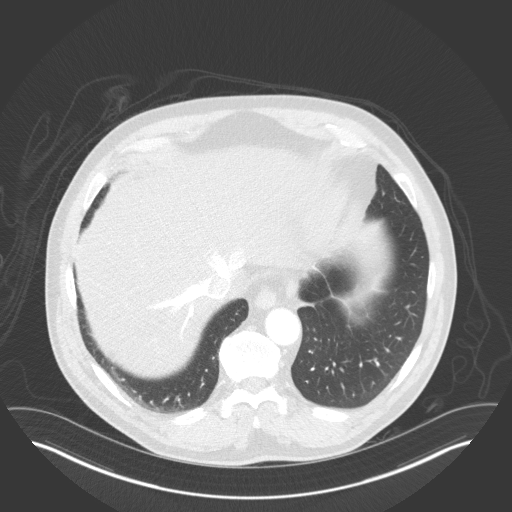
[im 157/169  soft-tissue]
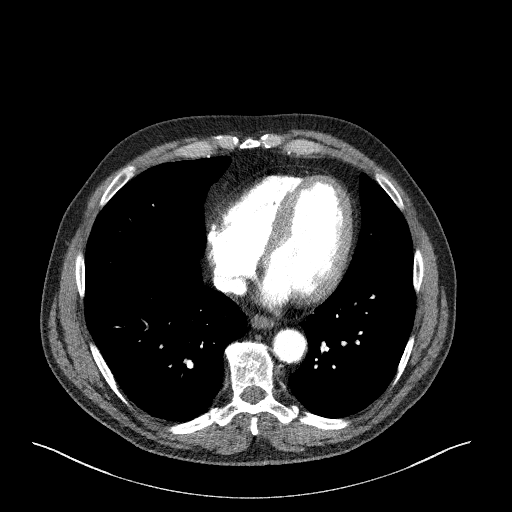
[im 157/169  lung]
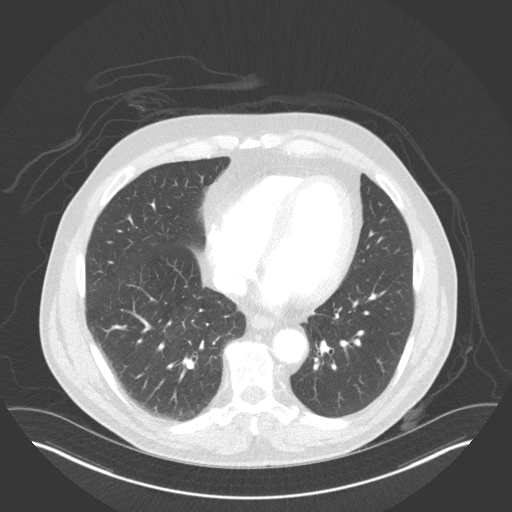
[im 157/169  bone]
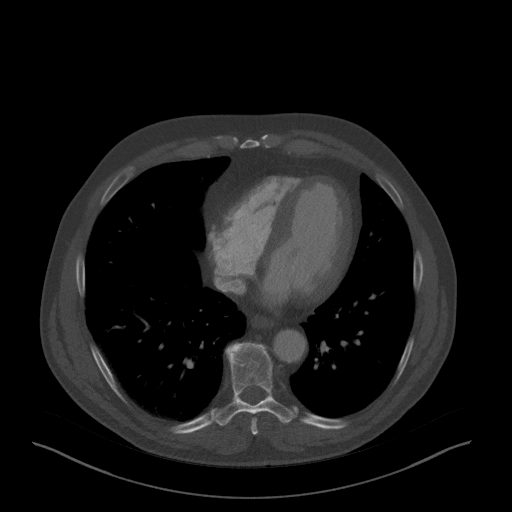

[Series 7: coronals · coronal · 0.88mm/px · 2 of 168 slices shown]
[im 56/168  soft-tissue]
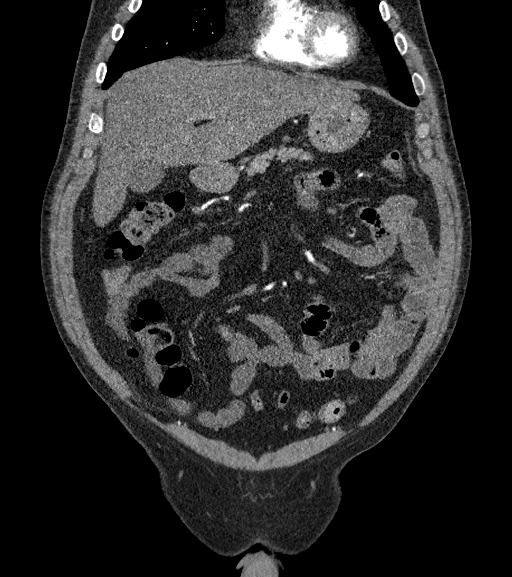
[im 112/168  soft-tissue]
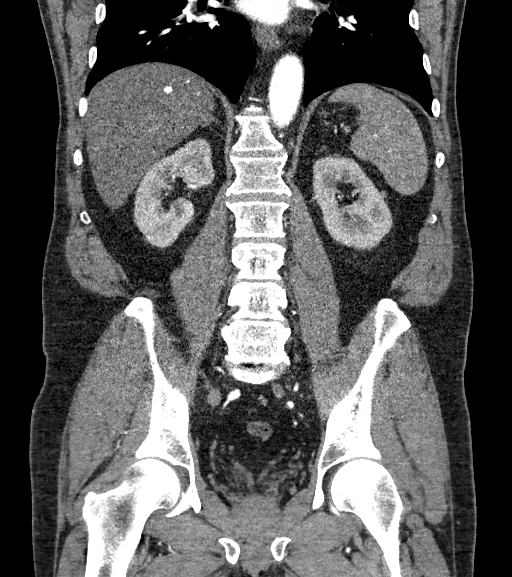

[12 of 46 positions shown; findings below may reference images not displayed]

FINDINGS: VASCULAR

Aorta: Status post endovascular repair of infrarenal abdominal
aortic aneurysm with aorto iliac stent graft. Graft is widely
patent. Small amount of eccentric mural thrombus at the proximal end
of the stent graft and left common iliac portion of the graft.
Residual native aneurysm sac measuring 2.8 x 3.1 cm, previously
x 3.2 x 3 cm. Mild aneurysmal dilatation at the proximal end of the
stent graft measures 3.6 x 3.6 cm, previously 3.6 x 3.7 x 3.7 cm.
Negative for Peri aortic inflammatory change or enhancement within
the residual native sac.

Celiac: Indistinct appearance surrounding the lumen of the celiac
trunk with mild extrinsic mass effect but no significant stenosis or
occlusion. This is visible at the bifurcation into the splenic and
hepatic arteries, series 5, image number 43. Distal vascular
patency.

SMA: Patent and without occlusion or stenosis. Mild calcified plaque
mid aspect of the main trunk as before.

Renals: Single right and single left renal arteries with early
branching of right renal artery. No significant stenosis.

IMA: Occluded at the origin with proximal reconstitution.

Inflow: Distal limbs of the stent graft are widely patent.
Atherosclerotic calcifications within the internal iliac vessels
without occlusion. Mildly ectatic left common iliac artery up to 19
mm. Mild disease of the external iliac arteries without stenosis or
occlusion.

Proximal Outflow: No significant occlusion or stenosis. Mild
atherosclerotic disease.

Veins: No obvious venous abnormality within the limitations of this
arterial phase study.

Review of the MIP images confirms the above findings.

NON-VASCULAR

Lower chest: Lung bases demonstrate no acute consolidation or
effusion. Heart size within normal limits. Reflux into the hepatic
veins suggesting elevated right heart pressure.

Hepatobiliary: Hepatic steatosis. No calcified gallstone or biliary
dilatation.

Pancreas: Unremarkable. No pancreatic ductal dilatation or
surrounding inflammatory changes.

Spleen: Normal in size without focal abnormality.

Adrenals/Urinary Tract: Right adrenal gland is normal. Thickened
appearance of left adrenal gland without dominant mass. No
hydronephrosis. Small hyperdense subcentimeter exophytic lesion mid
left kidney, no change. The urinary bladder is unremarkable.

Stomach/Bowel: Stomach is within normal limits. Appendix not clearly
identified but no right lower quadrant inflammatory process. No
evidence of bowel wall thickening, distention, or inflammatory
changes.

Lymphatic: Subcentimeter retroperitoneal lymph nodes.

Reproductive: Prostate is unremarkable.

Other: No free air or free fluid.

Musculoskeletal: Degenerative changes of the spine without acute or
suspicious osseous abnormality
IMPRESSION: VASCULAR

1. Post endovascular repair of infrarenal abdominal aortic aneurysm
with wide patency of the graft. No significant change in appearance
of the stent graft as compared with prior study.
2. Indistinct density surrounding the celiac trunk and its
bifurcation into the hepatic and splenic arteries with suggestion of
mild extrinsic mass effect on the distal trunk. Findings suggest
possible inflammatory process such as vasculitis. Vague soft tissue
density in the region, could reflect inflamed nodal tissue, as
alternate etiology for the findings. Negative for dissection,
significant stenosis, or occlusive disease.

NON-VASCULAR

1. Hepatic steatosis
2. No CT evidence for acute intra-abdominal or pelvic abnormality.

## 2019-08-12 MED ORDER — DICYCLOMINE HCL 10 MG PO CAPS
20.0000 mg | ORAL_CAPSULE | Freq: Once | ORAL | Status: AC
  Administered 2019-08-12: 20 mg via ORAL
  Filled 2019-08-12: qty 2

## 2019-08-12 MED ORDER — IOHEXOL 350 MG/ML SOLN
100.0000 mL | Freq: Once | INTRAVENOUS | Status: AC | PRN
  Administered 2019-08-12: 100 mL via INTRAVENOUS

## 2019-08-12 MED ORDER — DICYCLOMINE HCL 20 MG PO TABS: 20.0000 mg | ORAL_TABLET | Freq: Two times a day (BID) | ORAL | 0 refills | Status: DC | PRN

## 2019-08-12 NOTE — ED Notes (Signed)
Contacted Dr. Oneida Alar - Vascular consult - @ [phone]

## 2019-08-12 NOTE — ED Provider Notes (Signed)
[facility] EMERGENCY DEPARTMENT Provider Note   CSN: [id number] Arrival date & time: 08/12/19  1826     History   Chief Complaint Chief Complaint  Patient presents with  . Abdominal Pain    HPI Aaron [name] is a 63 y.o. male.     Patient with history of AAA status post repair, followed by vascular (Dr. Carlis Abbott), previous appendectomy --presents the emergency department today with several days of abdominal bloating and generalized discomfort.  Patient had a similar presentation in 10/2017.  He had imaging at that time which was reassuring.  Patient denies any nausea, vomiting, constipation.  He has had 2 watery bowel movements without blood today.  No urinary symptoms.  No chest pain or shortness of breath.  No treatments prior to arrival other than a laxative.  States that he has been eating poorly recently because he was on vacation.  The onset of this condition was acute. The course is constant. Aggravating factors: none. Alleviating factors: none.       Past Medical History:  Diagnosis Date  . Abdominal aortic aneurysm [facility])    sees Dr. Einar Gip for cardiac f/u, [phone]  . Arthritis    lower back  . Asthma    followed by Dr. Berdie Ogren  . Chronic airway obstruction, not elsewhere classified   . Dyspnea    normal PFT April 2012  . External hemorrhoids without mention of complication   . GERD (gastroesophageal reflux disease)   . Gout   . H/O hiatal hernia   . Hemorrhoids   . HTN (hypertension)    hx of sees Dr. Graylin Shiver  . Hyperlipidemia   . Insomnia, unspecified   . Other abnormal blood chemistry   . Other dyspnea and respiratory abnormality   . Other malaise and fatigue   . Other specified erythematous condition(695.89)   . Other testicular dysfunction   . Prostatitis, unspecified   . Rash 2011   left chest, biopsy 2012 Dr. Rozann Lesches, results pending  . Skin cancer   . Tobacco abuse     Patient Active Problem List   Diagnosis Date Noted   . Thrombosed external hemorrhoid 12/24/2016  . Uncomplicated asthma [id number]  . High risk medication use 12/24/2016  . Prostate cancer screening 12/24/2016  . Diarrhea 09/17/2016  . Other emphysema ([place]) 04/15/2015  . Erectile dysfunction 12/12/2014  . Fatty liver 04/04/2014  . Pulmonary nodules 04/04/2014  . Chronic back pain 12/06/2013  . Gout 12/06/2013  . Routine general medical examination at a health care facility 12/06/2013  . GERD (gastroesophageal reflux disease) 12/06/2013  . Tongue ulcer 12/06/2013  . AAA (abdominal aortic aneurysm) without rupture ([place]) 11/01/2013  . Depression 06/29/2013  . Musculoskeletal pain 06/07/2013  . Lung nodule, multiple 05/26/2013  . Chronic cough 03/21/2013  . Gouty arthropathy 02/23/2013  . Prediabetes 02/23/2013  . Need for prophylactic vaccination and inoculation against influenza 09/21/2012  . Pre-operative respiratory examination 09/21/2012  . Asthma, moderate persistent 03/05/2011  . HYPERLIPIDEMIA 01/20/2011  . Hx of smoking 01/20/2011  . Essential hypertension 01/20/2011    Past Surgical History:  Procedure Laterality Date  . ABDOMINAL AORTIC ENDOVASCULAR STENT GRAFT  09/22/2012   Procedure: ABDOMINAL AORTIC ENDOVASCULAR STENT GRAFT;  Surgeon: Mal Misty, MD;  Location: [facility];  Service: Vascular;  Laterality: N/A;  GORE; ultrasound guided.  . APPENDECTOMY    . EVALUATION UNDER ANESTHESIA WITH HEMORRHOIDECTOMY N/A 12/31/2016   Procedure: EXAM UNDER ANESTHESIA WITH HEMORRHOIDECTOMY;  Surgeon: [name], MD;  Location:  [facility];  Service: General;  Laterality: N/A;  . SKIN CANCER EXCISION    . TOE SURGERY  01/2012   joint of left great toe  . TONSILLECTOMY    . TONSILLECTOMY    . TYMPANOPLASTY    . WRIST SURGERY     left        Home Medications    Prior to Admission medications   Medication Sig Start Date End Date Taking? Authorizing Provider  albuterol (PROVENTIL HFA;VENTOLIN HFA) 108 (90  Base) MCG/ACT inhaler Inhale 2 puffs into the lungs every 6 (six) hours as needed. For cough and wheezing. 08/30/18   [name], NP  ALPRAZolam Duanne Moron) 0.25 MG tablet Take 1 tablet (0.25 mg total) by mouth 2 (two) times daily as needed for anxiety. 05/12/19   [name], NP  aspirin EC 81 MG tablet Take 81 mg by mouth daily.    [provider]  Adair Patter [phone] MCG/INH AEPB INHALE 1 PUFF BY MOUTH EVERY DAY 07/11/19   [name], NP  losartan (COZAAR) 25 MG tablet TAKE 2 TABLETS BY MOUTH ONCE DAILY TO CONTROL BLOOD PRESSURE (50MG  TABLETS ON BACK ORDER) 02/22/19   [name], NP  pantoprazole (PROTONIX) 40 MG tablet TAKE 1 TABLET BY MOUTH EVERY DAY 06/27/19   [name], NP  sertraline (ZOLOFT) 100 MG tablet Take 1 tablet (100 mg total) by mouth daily. 03/02/19   [name], NP  sildenafil (VIAGRA) 50 MG tablet TAKE 1 TABLET BY MOUTH DAILY AS NEEDED FOR ERECTILE DYSFUNCTION 02/14/19   [name], NP  simvastatin (ZOCOR) 20 MG tablet TAKE 1 TABLET BY MOUTH EVERYDAY AT BEDTIME 02/23/19   [name], NP    Family History Family History  Problem Relation Age of Onset  . Heart attack Father 104  . COPD Father   . Heart disease Father   . Asthma Daughter   . Cancer Mother        Breast  . Lupus Daughter   . Diabetes Other        Grandson     Social History Social History   Tobacco Use  . Smoking status: Current Every Day Smoker    Packs/day: 0.25    Years: 38.00    Pack years: 9.50    Types: Cigarettes    Last attempt to quit: 09/02/2012    Years since quitting: 6.9  . Smokeless tobacco: Former Systems developer    Types: Snuff  Substance Use Topics  . Alcohol use: No    Alcohol/week: 0.0 standard drinks    Comment: past ETOH use quit 11-26-2009  . Drug use: No     Allergies   Patient has no known allergies.   Review of Systems Review of Systems  Constitutional: Negative for fever.  HENT: Negative for rhinorrhea and sore  throat.   Eyes: Negative for redness.  Respiratory: Negative for cough.   Cardiovascular: Negative for chest pain.  Gastrointestinal: Positive for abdominal distention, abdominal pain and diarrhea. Negative for nausea and vomiting.  Genitourinary: Negative for dysuria.  Musculoskeletal: Negative for myalgias.  Skin: Negative for rash.  Neurological: Negative for headaches.     Physical Exam Updated Vital Signs BP (!) 176/101 (BP Location: Right Arm)   Pulse (!) 58   Temp 98.1 F (36.7 C) (Oral)   Resp 20   Ht 6\' 2"  (1.88 m)   Wt 103.4 kg   SpO2 99%   BMI 29.27 kg/m  Physical Exam Vitals signs and nursing note reviewed.  Constitutional:      Appearance: He is well-developed.  HENT:     Head: Normocephalic and atraumatic.  Eyes:     General:        Right eye: No discharge.        Left eye: No discharge.     Conjunctiva/sclera: Conjunctivae normal.  Neck:     Musculoskeletal: Normal range of motion and neck supple.  Cardiovascular:     Rate and Rhythm: Normal rate and regular rhythm.     Heart sounds: Normal heart sounds.  Pulmonary:     Effort: Pulmonary effort is normal.     Breath sounds: Normal breath sounds.  Abdominal:     General: There is distension.     Palpations: Abdomen is soft. There is no fluid wave.     Tenderness: There is generalized abdominal tenderness (mild discomfort). There is no guarding or rebound.  Skin:    General: Skin is warm and dry.  Neurological:     Mental Status: He is alert.      ED Treatments / Results  Labs (all labs ordered are listed, but only abnormal results are displayed) Labs Reviewed  LIPASE, BLOOD  COMPREHENSIVE METABOLIC PANEL  CBC  URINALYSIS, ROUTINE W REFLEX MICROSCOPIC    EKG None  Radiology Ct Angio Abd/pel W And/or Wo Contrast  Result Date: 08/12/2019 CLINICAL DATA:  Abdominal pain and bloating for 3 days EXAM: CTA ABDOMEN AND PELVIS wITHOUT AND WITH CONTRAST TECHNIQUE: Multidetector CT imaging of  the abdomen and pelvis was performed using the standard protocol during bolus administration of intravenous contrast. Multiplanar reconstructed images and MIPs were obtained and reviewed to evaluate the vascular anatomy. CONTRAST:  143mL OMNIPAQUE IOHEXOL 350 MG/ML SOLN COMPARISON:  CT 01/06/2019, 11/06/2015, 11/01/2013 FINDINGS: VASCULAR Aorta: Status post endovascular repair of infrarenal abdominal aortic aneurysm with aorto iliac stent graft. Graft is widely patent. Small amount of eccentric mural thrombus at the proximal end of the stent graft and left common iliac portion of the graft. Residual native aneurysm sac measuring 2.8 x 3.1 cm, previously 2.9 x 3.2 x 3 cm. Mild aneurysmal dilatation at the proximal end of the stent graft measures 3.6 x 3.6 cm, previously 3.6 x 3.7 x 3.7 cm. Negative for Peri aortic inflammatory change or enhancement within the residual native sac. Celiac: Indistinct appearance surrounding the lumen of the celiac trunk with mild extrinsic mass effect but no significant stenosis or occlusion. This is visible at the bifurcation into the splenic and hepatic arteries, series 5, image number 43. Distal vascular patency. SMA: Patent and without occlusion or stenosis. Mild calcified plaque mid aspect of the main trunk as before. Renals: Single right and single left renal arteries with early branching of right renal artery. No significant stenosis. IMA: Occluded at the origin with proximal reconstitution. Inflow: Distal limbs of the stent graft are widely patent. Atherosclerotic calcifications within the internal iliac vessels without occlusion. Mildly ectatic left common iliac artery up to 19 mm. Mild disease of the external iliac arteries without stenosis or occlusion. Proximal Outflow: No significant occlusion or stenosis. Mild atherosclerotic disease. Veins: No obvious venous abnormality within the limitations of this arterial phase study. Review of the MIP images confirms the above  findings. NON-VASCULAR Lower chest: Lung bases demonstrate no acute consolidation or effusion. Heart size within normal limits. Reflux into the hepatic veins suggesting elevated right heart pressure. Hepatobiliary: Hepatic steatosis. No calcified gallstone or biliary dilatation. Pancreas:  Unremarkable. No pancreatic ductal dilatation or surrounding inflammatory changes. Spleen: Normal in size without focal abnormality. Adrenals/Urinary Tract: Right adrenal gland is normal. Thickened appearance of left adrenal gland without dominant mass. No hydronephrosis. Small hyperdense subcentimeter exophytic lesion mid left kidney, no change. The urinary bladder is unremarkable. Stomach/Bowel: Stomach is within normal limits. Appendix not clearly identified but no right lower quadrant inflammatory process. No evidence of bowel wall thickening, distention, or inflammatory changes. Lymphatic: Subcentimeter retroperitoneal lymph nodes. Reproductive: Prostate is unremarkable. Other: No free air or free fluid. Musculoskeletal: Degenerative changes of the spine without acute or suspicious osseous abnormality IMPRESSION: VASCULAR 1. Post endovascular repair of infrarenal abdominal aortic aneurysm with wide patency of the graft. No significant change in appearance of the stent graft as compared with prior study. 2. Indistinct density surrounding the celiac trunk and its bifurcation into the hepatic and splenic arteries with suggestion of mild extrinsic mass effect on the distal trunk. Findings suggest possible inflammatory process such as vasculitis. Vague soft tissue density in the region, could reflect inflamed nodal tissue, as alternate etiology for the findings. Negative for dissection, significant stenosis, or occlusive disease. NON-VASCULAR 1. Hepatic steatosis 2. No CT evidence for acute intra-abdominal or pelvic abnormality. Electronically Signed   By: Donavan Foil M.D.   On: 08/12/2019 22:29    Procedures Procedures  (including critical care time)  Medications Ordered in ED Medications  iohexol (OMNIPAQUE) 350 MG/ML injection 100 mL (100 mLs Intravenous Contrast Given 08/12/19 2120)     Initial Impression / Assessment and Plan / ED Course  I have reviewed the triage vital signs and the nursing notes.  Pertinent labs & imaging results that were available during my care of the patient were reviewed by me and considered in my medical decision making (see chart for details).        Patient seen and examined. Work-up initiated. Reviewed previous vascular records and visit for similar presentation on 10/2017.  Discussed with Dr. Ralene Bathe.  Awaiting lab work.  Patient in no distress.  No peritonitis on exam.  Vital signs reviewed and are as follows: BP (!) 176/101 (BP Location: Right Arm)   Pulse (!) 58   Temp 98.1 F (36.7 C) (Oral)   Resp 20   Ht 6\' 2"  (1.88 m)   Wt 103.4 kg   SpO2 99%   BMI 29.27 kg/m   Patient seen earlier by Dr. Ralene Bathe. CT ordered to assess stability of AAA repair as well as to look for other possible etiologies of his symptoms. Pending.   Sign-out to Dr. Ralene Bathe at shift change who will f/u on CT results.   Final Clinical Impressions(s) / ED Diagnoses   Final diagnoses:  Abdominal distention   Pending CT. Exam without focal tenderness or peritonitis.  Labs are completely normal.  ED Discharge Orders    None       Carlisle Cater, Hershal Coria 08/13/19 1726    Quintella Reichert, MD 08/16/19 1106

## 2019-08-12 NOTE — ED Notes (Signed)
Patient transported to CT 

## 2019-08-12 NOTE — Discharge Instructions (Addendum)
Please read and follow all provided instructions.  Your diagnoses today include:  1. Abdominal distention     Tests performed today include:  Blood counts and electrolytes  Blood tests to check liver and kidney function  Blood tests to check pancreas function  Urine test to look for infection  CT scan  Vital signs. See below for your results today.   Home care instructions:   Follow any educational materials contained in this packet.  Follow-up instructions: Please follow-up with your primary care provider in the next 3 days for further evaluation of your symptoms.    Return instructions:  SEEK IMMEDIATE MEDICAL ATTENTION IF:  The pain does not go away or becomes severe   A temperature above 101F develops   Repeated vomiting occurs (multiple episodes)   The pain becomes localized to portions of the abdomen. The right side could possibly be appendicitis. In an adult, the left lower portion of the abdomen could be colitis or diverticulitis.   Blood is being passed in stools or vomit (bright red or black tarry stools)   You develop chest pain, difficulty breathing, dizziness or fainting, or become confused, poorly responsive, or inconsolable (young children)  If you have any other emergent concerns regarding your health  Additional Information: Abdominal (belly) pain can be caused by many things. Your caregiver performed an examination and possibly ordered blood/urine tests and imaging (CT scan, x-rays, ultrasound). Many cases can be observed and treated at home after initial evaluation in the emergency department. Even though you are being discharged home, abdominal pain can be unpredictable. Therefore, you need a repeated exam if your pain does not resolve, returns, or worsens. Most patients with abdominal pain don't have to be admitted to the hospital or have surgery, but serious problems like appendicitis and gallbladder attacks can start out as nonspecific pain. Many  abdominal conditions cannot be diagnosed in one visit, so follow-up evaluations are very important.  Your vital signs today were: BP (!) 176/101 (BP Location: Right Arm)    Pulse (!) 58    Temp 98.1 F (36.7 C) (Oral)    Resp 20    Ht 6\' 2"  (1.88 m)    Wt 103.4 kg    SpO2 99%    BMI 29.27 kg/m  If your blood pressure (bp) was elevated above 135/85 this visit, please have this repeated by your doctor within one month. --------------

## 2019-08-12 NOTE — ED Triage Notes (Signed)
Abdominal pain and bloating for 3 days. Has been having BM's and took laxative. States HX of triple A with stent placed in 2014. Denies n/v/d.

## 2019-08-15 ENCOUNTER — Other Ambulatory Visit: Payer: Self-pay | Admitting: *Deleted

## 2019-08-15 DIAGNOSIS — F419 Anxiety disorder, unspecified: Secondary | ICD-10-CM

## 2019-08-15 NOTE — Telephone Encounter (Signed)
Patient requested refill.  Epic LR 05/12/2019 Pended Rx and sent to [facility] for approval.

## 2019-08-16 ENCOUNTER — Other Ambulatory Visit: Payer: Self-pay

## 2019-08-16 ENCOUNTER — Encounter: Payer: Self-pay | Admitting: Family

## 2019-08-16 ENCOUNTER — Ambulatory Visit (INDEPENDENT_AMBULATORY_CARE_PROVIDER_SITE_OTHER): Payer: 59 | Admitting: Family

## 2019-08-16 DIAGNOSIS — R140 Abdominal distension (gaseous): Secondary | ICD-10-CM

## 2019-08-16 DIAGNOSIS — R109 Unspecified abdominal pain: Secondary | ICD-10-CM

## 2019-08-16 DIAGNOSIS — F419 Anxiety disorder, unspecified: Secondary | ICD-10-CM | POA: Diagnosis not present

## 2019-08-16 MED ORDER — ALPRAZOLAM 0.25 MG PO TABS: 0.2500 mg | ORAL_TABLET | Freq: Two times a day (BID) | ORAL | 0 refills | Status: DC | PRN

## 2019-08-16 NOTE — Progress Notes (Signed)
This service is provided via telemedicine  No vital signs collected/recorded due to the encounter was a telemedicine visit.   Location of patient (ex: home, work):  Home  Patient consents to a telephone visit:  Yes  Location of the provider (ex: office, home):  Office   Name of any referring provider:  Dani Gobble   Names of all persons participating in the telemedicine service and their role in the encounter: Marlowe Sax, NP, Ruthell Rummage CMA, [name]   Time spent on call:  Ruthell Rummage CMA spent 7 minutes on phone with patient.    Location:      Place of Service:    Provider: Waneda Klammer FNP-C  [name], NP  [name], NP as PCP - General (Nurse Practitioner) [name], [name], NP as Nurse Practitioner (Vascular Surgery) [name], MD as Consulting Physician (General Surgery) Brand Males, MD as Consulting Physician (Pulmonary Disease) [name], MD as Consulting Physician (Cardiology) Starlyn Skeans as Physician Assistant (Dermatology)  Extended Emergency Contact Information Primary Emergency Contact: Ching,[name] Address: [address]          [place], [place] [address] Johnnette Litter of [place] Phone: [phone] Mobile Phone: [phone] Relation: Spouse  Code Status:  Full Code  Goals of care: Advanced Directive information Advanced Directives 08/12/2019  Does Patient Have a Medical Advance Directive? No  Would patient like information on creating a medical advance directive? -  Pre-existing out of facility DNR order (yellow form or pink MOST form) -     Chief Complaint  Patient presents with  . Follow-up    ER follow up on abdominal distention would like referral to GI    HPI:  Pt is a 63 y.o. male seen today for an acute visit for ED visit for abdominal pain and distention 08/12/2019.He has a significant medical history of AAA status post repair follows up with  Vascular specialist Dr.Clark,GERD,chronic airway obstruction,[place],Hx hiatal hernia among other conditions.His lab work,Urine analysis and CT scan were unremarkable.He was discharge home to follow up on outpatient Gastroenterology and PCP. He states abdominal pain and bloating symptoms resolved on Saturday 08/13/2019.He has close relatives who have celiac disease.wonders whether he had an inflammation.He states has been eating gluten free food which seems to have helped with his symptoms.He denies any abdominal pain ,blooating,nausea,vomiting,diarrhea or constipation.   Anxiety - request refill for Xanax 0.25 mg tablet  One by mouth twice daily.He states was trying to wean off medication but has been dealing with a lot of stress unable to sleep at night worrying.His close friend is on the ventilator not doing well vent will be removed today.also caring for his mother in law and will be traveling to [place] has a property he is trying to sale.He denies worsening of  Depression symptoms.     Past Medical History:  Diagnosis Date  . Abdominal aortic aneurysm [facility])    sees Dr. Einar Gip for cardiac f/u, [phone]  . Arthritis    lower back  . Asthma    followed by Dr. Berdie Ogren  . Chronic airway obstruction, not elsewhere classified   . Dyspnea    normal PFT April 2012  . External hemorrhoids without mention of complication   . GERD (gastroesophageal reflux disease)   . Gout   . H/O hiatal hernia   . Hemorrhoids   . HTN (hypertension)    hx of sees Dr. Graylin Shiver  . Hyperlipidemia   . Insomnia, unspecified   .  Other abnormal blood chemistry   . Other dyspnea and respiratory abnormality   . Other malaise and fatigue   . Other specified erythematous condition(695.89)   . Other testicular dysfunction   . Prostatitis, unspecified   . Rash 2011   left chest, biopsy 2012 Dr. Rozann Lesches, results pending  . Skin cancer   . Tobacco abuse    Past Surgical History:  Procedure Laterality  Date  . ABDOMINAL AORTIC ENDOVASCULAR STENT GRAFT  09/22/2012   Procedure: ABDOMINAL AORTIC ENDOVASCULAR STENT GRAFT;  Surgeon: Mal Misty, MD;  Location: [place];  Service: Vascular;  Laterality: N/A;  GORE; ultrasound guided.  . APPENDECTOMY    . EVALUATION UNDER ANESTHESIA WITH HEMORRHOIDECTOMY N/A 12/31/2016   Procedure: EXAM UNDER ANESTHESIA WITH HEMORRHOIDECTOMY;  Surgeon: [name], MD;  Location: [place];  Service: General;  Laterality: N/A;  . SKIN CANCER EXCISION    . TOE SURGERY  01/2012   joint of left great toe  . TONSILLECTOMY    . TONSILLECTOMY    . TYMPANOPLASTY    . WRIST SURGERY     left    No Known Allergies  Outpatient Encounter Medications as of 08/16/2019  Medication Sig  . albuterol (PROVENTIL HFA;VENTOLIN HFA) 108 (90 Base) MCG/ACT inhaler Inhale 2 puffs into the lungs every 6 (six) hours as needed. For cough and wheezing.  Marland Kitchen ALPRAZolam (XANAX) 0.25 MG tablet Take 1 tablet (0.25 mg total) by mouth 2 (two) times daily as needed for anxiety.  Marland Kitchen aspirin EC 81 MG tablet Take 81 mg by mouth daily.  Marland Kitchen BREO ELLIPTA 200-25 MCG/INH AEPB INHALE 1 PUFF BY MOUTH EVERY DAY  . dicyclomine (BENTYL) 20 MG tablet Take 1 tablet (20 mg total) by mouth 2 (two) times daily as needed for spasms.  Marland Kitchen losartan (COZAAR) 25 MG tablet TAKE 2 TABLETS BY MOUTH ONCE DAILY TO CONTROL BLOOD PRESSURE (50MG  TABLETS ON BACK ORDER)  . pantoprazole (PROTONIX) 40 MG tablet TAKE 1 TABLET BY MOUTH EVERY DAY  . sertraline (ZOLOFT) 100 MG tablet Take 1 tablet (100 mg total) by mouth daily.  . sildenafil (VIAGRA) 50 MG tablet TAKE 1 TABLET BY MOUTH DAILY AS NEEDED FOR ERECTILE DYSFUNCTION  . simvastatin (ZOCOR) 20 MG tablet TAKE 1 TABLET BY MOUTH EVERYDAY AT BEDTIME   No facility-administered encounter medications on file as of 08/16/2019.     Review of Systems  Constitutional: Negative for chills, fatigue and fever.  Respiratory: Negative for cough, chest tightness, shortness  of breath and wheezing.   Cardiovascular: Negative for chest pain, palpitations and leg swelling.  Gastrointestinal: Negative for abdominal distention, abdominal pain, constipation, diarrhea, nausea and vomiting.  Genitourinary: Negative for difficulty urinating, dysuria, flank pain, frequency and urgency.  Skin: Negative for color change, pallor and rash.  Neurological: Negative for dizziness, light-headedness, numbness and headaches.  Psychiatric/Behavioral: Positive for sleep disturbance. The patient is nervous/anxious.     Immunization History  Administered Date(s) Administered  . Influenza Split 09/20/2012  . Influenza,inj,Quad PF,6+ Mos 09/29/2013, 08/08/2014, 08/09/2015, 07/17/2016, 10/14/2017, 01/14/2019  . Influenza-Unspecified 08/24/2017  . Pneumococcal Conjugate-13 12/12/2014, 12/12/2014  . Pneumococcal Polysaccharide-23 06/30/2011  . Tdap 12/06/2013   Pertinent  Health Maintenance Due  Topic Date Due  . INFLUENZA VACCINE  06/25/2019  . COLONOSCOPY  10/11/2019   Fall Risk  02/17/2019 01/14/2019 11/25/2018 08/30/2018 02/10/2018  Falls in the past year? 0 0 0 No No  Number falls in past yr: 0 0 0 - -  Injury with Fall?  0 0 0 - -   There were no vitals filed for this visit. There is no height or weight on file to calculate BMI. Physical Exam Unable to complete on telephone visit.   Labs reviewed: Recent Labs    08/26/18 0812 08/12/19 2011  NA 139 140  K 4.2 3.7  CL 105 102  CO2 26 27  GLUCOSE 100* 93  BUN 15 7  CREATININE 0.94 0.89  CALCIUM 9.3 9.2   Recent Labs    08/26/18 0812 08/12/19 2011  AST 25 26  ALT 27 34  ALKPHOS  --  79  BILITOT 0.6 0.8  PROT 6.6 7.2  ALBUMIN  --  4.2   Recent Labs    08/26/18 0812 08/12/19 2011  WBC 6.3 9.3  NEUTROABS 4,001  --   HGB 15.0 15.7  HCT 45.0 47.6  MCV 90.0 91.2  PLT 253 239   Lab Results  Component Value Date   TSH 2.24 12/24/2016   Lab Results  Component Value Date   HGBA1C 6.0 (H) 08/09/2015    Lab Results  Component Value Date   CHOL 138 08/26/2018   HDL 40 (L) 08/26/2018   LDLCALC 83 08/26/2018   TRIG 66 08/26/2018   CHOLHDL 3.5 08/26/2018    Significant Diagnostic Results in last 30 days:  Ct Angio Abd/pel W And/or Wo Contrast  Result Date: 08/12/2019 CLINICAL DATA:  Abdominal pain and bloating for 3 days EXAM: CTA ABDOMEN AND PELVIS wITHOUT AND WITH CONTRAST TECHNIQUE: Multidetector CT imaging of the abdomen and pelvis was performed using the standard protocol during bolus administration of intravenous contrast. Multiplanar reconstructed images and MIPs were obtained and reviewed to evaluate the vascular anatomy. CONTRAST:  171mL OMNIPAQUE IOHEXOL 350 MG/ML SOLN COMPARISON:  CT 01/06/2019, 11/06/2015, 11/01/2013 FINDINGS: VASCULAR Aorta: Status post endovascular repair of infrarenal abdominal aortic aneurysm with aorto iliac stent graft. Graft is widely patent. Small amount of eccentric mural thrombus at the proximal end of the stent graft and left common iliac portion of the graft. Residual native aneurysm sac measuring 2.8 x 3.1 cm, previously 2.9 x 3.2 x 3 cm. Mild aneurysmal dilatation at the proximal end of the stent graft measures 3.6 x 3.6 cm, previously 3.6 x 3.7 x 3.7 cm. Negative for Peri aortic inflammatory change or enhancement within the residual native sac. Celiac: Indistinct appearance surrounding the lumen of the celiac trunk with mild extrinsic mass effect but no significant stenosis or occlusion. This is visible at the bifurcation into the splenic and hepatic arteries, series 5, image number 43. Distal vascular patency. SMA: Patent and without occlusion or stenosis. Mild calcified plaque mid aspect of the main trunk as before. Renals: Single right and single left renal arteries with early branching of right renal artery. No significant stenosis. IMA: Occluded at the origin with proximal reconstitution. Inflow: Distal limbs of the stent graft are widely patent.  Atherosclerotic calcifications within the internal iliac vessels without occlusion. Mildly ectatic left common iliac artery up to 19 mm. Mild disease of the external iliac arteries without stenosis or occlusion. Proximal Outflow: No significant occlusion or stenosis. Mild atherosclerotic disease. Veins: No obvious venous abnormality within the limitations of this arterial phase study. Review of the MIP images confirms the above findings. NON-VASCULAR Lower chest: Lung bases demonstrate no acute consolidation or effusion. Heart size within normal limits. Reflux into the hepatic veins suggesting elevated right heart pressure. Hepatobiliary: Hepatic steatosis. No calcified gallstone or biliary dilatation. Pancreas: Unremarkable. No pancreatic ductal  dilatation or surrounding inflammatory changes. Spleen: Normal in size without focal abnormality. Adrenals/Urinary Tract: Right adrenal gland is normal. Thickened appearance of left adrenal gland without dominant mass. No hydronephrosis. Small hyperdense subcentimeter exophytic lesion mid left kidney, no change. The urinary bladder is unremarkable. Stomach/Bowel: Stomach is within normal limits. Appendix not clearly identified but no right lower quadrant inflammatory process. No evidence of bowel wall thickening, distention, or inflammatory changes. Lymphatic: Subcentimeter retroperitoneal lymph nodes. Reproductive: Prostate is unremarkable. Other: No free air or free fluid. Musculoskeletal: Degenerative changes of the spine without acute or suspicious osseous abnormality IMPRESSION: VASCULAR 1. Post endovascular repair of infrarenal abdominal aortic aneurysm with wide patency of the graft. No significant change in appearance of the stent graft as compared with prior study. 2. Indistinct density surrounding the celiac trunk and its bifurcation into the hepatic and splenic arteries with suggestion of mild extrinsic mass effect on the distal trunk. Findings suggest possible  inflammatory process such as vasculitis. Vague soft tissue density in the region, could reflect inflamed nodal tissue, as alternate etiology for the findings. Negative for dissection, significant stenosis, or occlusive disease. NON-VASCULAR 1. Hepatic steatosis 2. No CT evidence for acute intra-abdominal or pelvic abnormality. Electronically Signed   By: Donavan Foil M.D.   On: 08/12/2019 22:29    Assessment/Plan 1. Anxiety Has had increased anxiety due to friend not doing well to be removed from ventilator today.Also caring for aging mother in law and stressed trying to sale property in [place].Will refill Xanax.  - ALPRAZolam (XANAX) 0.25 MG tablet; Take 1 tablet (0.25 mg total) by mouth 2 (two) times daily as needed for anxiety.  Dispense: 20 tablet; Refill: 0  2. Abdominal pain, unspecified abdominal location Symptoms resolved after ED.Request referral for evaluation by GI possible celiac disease.   3. Abdominal bloating Symptoms resolved with uses of gluten free foods.Request referral for evaluation by GI possible celiac disease.Refer to GI for further evaluation.    Family/ staff Communication: Reviewed plan of care with patient   Labs/tests ordered: None  Spent 11 minutes of non-face to face with patient    Sandrea Hughs, NP

## 2019-08-22 ENCOUNTER — Other Ambulatory Visit: Payer: Self-pay | Admitting: Nurse Practitioner

## 2019-08-22 DIAGNOSIS — I10 Essential (primary) hypertension: Secondary | ICD-10-CM

## 2019-08-22 DIAGNOSIS — E782 Mixed hyperlipidemia: Secondary | ICD-10-CM

## 2019-09-01 ENCOUNTER — Other Ambulatory Visit: Payer: Self-pay | Admitting: Nurse Practitioner

## 2019-09-01 DIAGNOSIS — N529 Male erectile dysfunction, unspecified: Secondary | ICD-10-CM

## 2019-09-02 ENCOUNTER — Other Ambulatory Visit: Payer: 59

## 2019-09-05 ENCOUNTER — Ambulatory Visit: Payer: 59 | Admitting: Nurse Practitioner

## 2019-09-09 ENCOUNTER — Other Ambulatory Visit: Payer: Self-pay

## 2019-09-14 ENCOUNTER — Ambulatory Visit: Payer: Self-pay | Admitting: Nurse Practitioner

## 2019-09-19 ENCOUNTER — Other Ambulatory Visit: Payer: Self-pay

## 2019-09-19 ENCOUNTER — Other Ambulatory Visit: Payer: 59

## 2019-09-19 ENCOUNTER — Ambulatory Visit (INDEPENDENT_AMBULATORY_CARE_PROVIDER_SITE_OTHER): Payer: 59 | Admitting: Nurse Practitioner

## 2019-09-19 VITALS — BP 132/84 | HR 60 | Temp 98.6°F | Ht 71.5 in | Wt 230.0 lb

## 2019-09-19 DIAGNOSIS — H6122 Impacted cerumen, left ear: Secondary | ICD-10-CM

## 2019-09-19 DIAGNOSIS — Z125 Encounter for screening for malignant neoplasm of prostate: Secondary | ICD-10-CM | POA: Diagnosis not present

## 2019-09-19 DIAGNOSIS — Z0000 Encounter for general adult medical examination without abnormal findings: Secondary | ICD-10-CM

## 2019-09-19 DIAGNOSIS — I10 Essential (primary) hypertension: Secondary | ICD-10-CM

## 2019-09-19 DIAGNOSIS — E782 Mixed hyperlipidemia: Secondary | ICD-10-CM

## 2019-09-19 NOTE — Progress Notes (Signed)
Provider: [name], NP  [name], NP as PCP - General (Nurse Practitioner) [name], [name], NP as Nurse Practitioner (Vascular Surgery) [name], MD as Consulting Physician (General Surgery) Brand Males, MD as Consulting Physician (Pulmonary Disease) [name], MD as Consulting Physician (Cardiology) [name], PA-C as Physician Assistant (Dermatology)  Extended Emergency Contact Information Primary Emergency Contact: Simm,[name] Address: [address]          [place], [place] [address] Johnnette Litter of [place] Phone: [phone] Mobile Phone: [phone] Relation: Spouse No Known Allergies Code Status: FULL Goals of Care: Advanced Directive information Advanced Directives 09/19/2019  Does Patient Have a Medical Advance Directive? No  Would patient like information on creating a medical advance directive? No - Patient declined  Pre-existing out of facility DNR order (yellow form or pink MOST form) -     Chief Complaint  Patient presents with  . Annual Exam    Yearly physical   . Quality Metric Gaps    Discuss need for HIV and Hep C Screening   . Medication Refill    Renew Zoloft, patient off x several months     HPI: Patient is a 63 y.o. male seen in today for an annual wellness exam.    Got labs this morning.  Went to ED last month, following with GI  Diet?none.   Exercise? None.    Dentition: routinely every 4 months.   Ophthalmology appt: every year  Routine specialist: dermatologist (needs to update, plans to go), vascular, endocrine   Anxiety- using xanax maybe twice a week, feels like this controls symptoms.   Has given blood in the past, therefore no need to test for hep c or HIV  Depression screen [facility] 2/9 09/19/2019 01/14/2019 08/30/2018 06/24/2017 12/20/2015  Decreased Interest 0 0 0 0 0  Down, Depressed, Hopeless 0 0 0 0 0  PHQ - 2 Score 0 0 0 0 0    Fall Risk  09/19/2019  02/17/2019 01/14/2019 11/25/2018 08/30/2018  Falls in the past year? 0 0 0 0 No  Number falls in past yr: 0 0 0 0 -  Injury with Fall? 0 0 0 0 -   No flowsheet data found.   Health Maintenance  Topic Date Due  . Hepatitis C Screening  Mar 27, 1956  . HIV Screening  09/15/1973  . COLONOSCOPY  10/11/2019  . TETANUS/TDAP  12/07/2023  . INFLUENZA VACCINE  Completed    Past Medical History:  Diagnosis Date  . Abdominal aortic aneurysm [facility])    sees Dr. Einar Gip for cardiac f/u, [phone]  . Arthritis    lower back  . Asthma    followed by Dr. Berdie Ogren  . Chronic airway obstruction, not elsewhere classified   . Dyspnea    normal PFT April 2012  . External hemorrhoids without mention of complication   . GERD (gastroesophageal reflux disease)   . Gout   . H/O hiatal hernia   . Hemorrhoids   . HTN (hypertension)    hx of sees Dr. Graylin Shiver  . Hyperlipidemia   . Insomnia, unspecified   . Other abnormal blood chemistry   . Other dyspnea and respiratory abnormality   . Other malaise and fatigue   . Other specified erythematous condition(695.89)   . Other testicular dysfunction   . Prostatitis, unspecified   . Rash 2011   left chest, biopsy 2012 Dr. Rozann Lesches, results pending  . Skin cancer   . Tobacco abuse  Past Surgical History:  Procedure Laterality Date  . ABDOMINAL AORTIC ENDOVASCULAR STENT GRAFT  09/22/2012   Procedure: ABDOMINAL AORTIC ENDOVASCULAR STENT GRAFT;  Surgeon: Mal Misty, MD;  Location: [place];  Service: Vascular;  Laterality: N/A;  GORE; ultrasound guided.  . APPENDECTOMY    . EVALUATION UNDER ANESTHESIA WITH HEMORRHOIDECTOMY N/A 12/31/2016   Procedure: EXAM UNDER ANESTHESIA WITH HEMORRHOIDECTOMY;  Surgeon: [name], MD;  Location: [place];  Service: General;  Laterality: N/A;  . SKIN CANCER EXCISION    . TOE SURGERY  01/2012   joint of left great toe  . TONSILLECTOMY    . TONSILLECTOMY    . TYMPANOPLASTY    . WRIST  SURGERY     left    Social History   Socioeconomic History  . Marital status: Married    Spouse name: Not on file  . Number of children: Not on file  . Years of education: Not on file  . Highest education level: Not on file  Occupational History  . Occupation: Forensic psychologist: [employer]  . Financial resource strain: Not on file  . Food insecurity    Worry: Not on file    Inability: Not on file  . Transportation needs    Medical: Not on file    Non-medical: Not on file  Tobacco Use  . Smoking status: Current Every Day Smoker    Packs/day: 0.25    Years: 38.00    Pack years: 9.50    Types: Cigarettes    Last attempt to quit: 09/02/2012    Years since quitting: 7.0  . Smokeless tobacco: Former Systems developer    Types: Snuff  Substance and Sexual Activity  . Alcohol use: No    Alcohol/week: 0.0 standard drinks    Comment: past ETOH use quit 11-26-2009  . Drug use: No  . Sexual activity: Not on file  Lifestyle  . Physical activity    Days per week: Not on file    Minutes per session: Not on file  . Stress: Not on file  Relationships  . Social Herbalist on phone: Not on file    Gets together: Not on file    Attends religious service: Not on file    Active member of club or organization: Not on file    Attends meetings of clubs or organizations: Not on file    Relationship status: Not on file  Other Topics Concern  . Not on file  Social History Narrative  . Not on file    Family History  Problem Relation Age of Onset  . Heart attack Father 59  . COPD Father   . Heart disease Father   . Asthma Daughter   . Cancer Mother        Breast  . Lupus Daughter   . Diabetes Other        Grandson     Review of Systems:  Review of Systems  Constitutional: Negative for chills, fever and weight loss.  Eyes: Negative.   Respiratory: Negative for cough, sputum production and shortness of breath.   Cardiovascular: Negative for chest pain, palpitations  and leg swelling.  Gastrointestinal: Negative for abdominal pain, constipation, diarrhea, heartburn, nausea and vomiting.  Genitourinary: Negative for dysuria, frequency and urgency.  Musculoskeletal: Positive for back pain (occasionally low back pain). Negative for falls, joint pain and myalgias.  Skin: Negative.   Neurological: Negative for dizziness and headaches.  Psychiatric/Behavioral: Negative for depression and memory loss. The patient is not nervous/anxious and does not have insomnia.      Allergies as of 09/19/2019   No Known Allergies     Medication List       Accurate as of September 19, 2019  1:48 PM. If you have any questions, ask your nurse or doctor.        STOP taking these medications   dicyclomine 20 MG tablet Commonly known as: BENTYL Stopped by: [name], NP     TAKE these medications   albuterol 108 (90 Base) MCG/ACT inhaler Commonly known as: VENTOLIN HFA Inhale 2 puffs into the lungs every 6 (six) hours as needed. For cough and wheezing.   ALPRAZolam 0.25 MG tablet Commonly known as: XANAX Take 1 tablet (0.25 mg total) by mouth 2 (two) times daily as needed for anxiety.   aspirin EC 81 MG tablet Take 81 mg by mouth daily.   Breo Ellipta 200-25 MCG/INH Aepb Generic drug: fluticasone furoate-vilanterol INHALE 1 PUFF BY MOUTH EVERY DAY   losartan 50 MG tablet Commonly known as: COZAAR TAKE 1 TABLET BY MOUTH ONCE DAILY TO CONTROL BLOOD PRESSURE   pantoprazole 40 MG tablet Commonly known as: PROTONIX TAKE 1 TABLET BY MOUTH EVERY DAY   sertraline 100 MG tablet Commonly known as: ZOLOFT Take 1 tablet (100 mg total) by mouth daily.   sildenafil 50 MG tablet Commonly known as: VIAGRA Take 1 tablet (50 mg total) by mouth as needed for erectile dysfunction (DX N52.9).   simvastatin 20 MG tablet Commonly known as: ZOCOR TAKE 1 TABLET BY MOUTH EVERYDAY AT BEDTIME         Physical Exam: Vitals:   09/19/19 1338  BP: 132/84   Pulse: 60  Temp: 98.6 F (37 C)  TempSrc: Temporal  SpO2: 97%  Weight: 230 lb (104.3 kg)  Height: 5' 11.5" (1.816 m)   Body mass index is 31.63 kg/m. Wt Readings from Last 3 Encounters:  09/19/19 230 lb (104.3 kg)  08/12/19 228 lb (103.4 kg)  01/14/19 230 lb (104.3 kg)    Physical Exam Constitutional:      General: He is not in acute distress.    Appearance: He is well-developed. He is not diaphoretic.  HENT:     Head: Normocephalic and atraumatic.     Right Ear: There is impacted cerumen.     Left Ear: Ear canal normal.  Eyes:     Conjunctiva/sclera: Conjunctivae normal.     Pupils: Pupils are equal, round, and reactive to light.  Neck:     Musculoskeletal: Normal range of motion and neck supple.  Cardiovascular:     Rate and Rhythm: Normal rate and regular rhythm.     Heart sounds: Normal heart sounds.  Pulmonary:     Effort: Pulmonary effort is normal.     Breath sounds: Normal breath sounds.  Abdominal:     General: Bowel sounds are normal.     Palpations: Abdomen is soft.  Musculoskeletal:        General: No tenderness.  Skin:    General: Skin is warm and dry.  Neurological:     Mental Status: He is alert and oriented to person, place, and time.       Labs reviewed: Basic Metabolic Panel: Recent Labs    08/12/19 2011  NA 140  K 3.7  CL 102  CO2 27  GLUCOSE 93  BUN 7  CREATININE 0.89  CALCIUM 9.2  Liver Function Tests: Recent Labs    08/12/19 2011  AST 26  ALT 34  ALKPHOS 79  BILITOT 0.8  PROT 7.2  ALBUMIN 4.2   Recent Labs    08/12/19 2011  LIPASE 22   No results for input(s): AMMONIA in the last 8760 hours. CBC: Recent Labs    08/12/19 2011  WBC 9.3  HGB 15.7  HCT 47.6  MCV 91.2  PLT 239   Lipid Panel: No results for input(s): CHOL, HDL, LDLCALC, TRIG, CHOLHDL, LDLDIRECT in the last 8760 hours. Lab Results  Component Value Date   HGBA1C 6.0 (H) 08/09/2015    Procedures: No results found.  Assessment/Plan 1.  Wellness examination Pt is doing well. Plans to get skin assessment through dermatologist. To keep follow up with specialist.  The patient was counseled regarding the appropriate use of alcohol, prevention of dental and periodontal disease, diet, regular sustained exercise for at least 30 minutes 5 times per week, testicular self-examination on a monthly basis,smoking cessation, tobacco use,  and recommended schedule for GI hemoccult testing, colonoscopy, cholesterol, thyroid and diabetes screening.  2. Prostate cancer screening -PSA screening test done, no changes in bladder stream, frequency.   3. Impacted cerumen of left ear Unable to remove with flush and grabber, advised pt to use debrox to soften and can follow up in office after use for 4 days to remove.   Next appt: 6 months for routine follow up.  Carlos American. [place], [facility] [phone]

## 2019-09-19 NOTE — Patient Instructions (Signed)

## 2019-09-20 ENCOUNTER — Telehealth: Payer: Self-pay

## 2019-09-20 ENCOUNTER — Ambulatory Visit (INDEPENDENT_AMBULATORY_CARE_PROVIDER_SITE_OTHER): Payer: 59 | Admitting: Nurse Practitioner

## 2019-09-20 ENCOUNTER — Encounter: Payer: Self-pay | Admitting: Nurse Practitioner

## 2019-09-20 VITALS — Ht 71.5 in | Wt 230.0 lb

## 2019-09-20 DIAGNOSIS — R05 Cough: Secondary | ICD-10-CM | POA: Diagnosis not present

## 2019-09-20 DIAGNOSIS — R059 Cough, unspecified: Secondary | ICD-10-CM

## 2019-09-20 LAB — LIPID PANEL
Cholesterol: 138 mg/dL (ref <200)
HDL: 45 mg/dL (ref >=40)
LDL Cholesterol (Calc): 77 mg/dL (calc)
Non-HDL Cholesterol (Calc): 93 mg/dL (calc) (ref <130)
Total CHOL/HDL Ratio: 3.1 (calc) (ref <5.0)
Triglycerides: 75 mg/dL (ref <150)

## 2019-09-20 LAB — COMPLETE METABOLIC PANEL WITH GFR
AG Ratio: 1.8 (calc) (ref 1.0–2.5)
ALT: 32 U/L (ref 9–46)
AST: 31 U/L (ref 10–35)
Albumin: 4.3 g/dL (ref 3.6–5.1)
Alkaline phosphatase (APISO): 85 U/L (ref 35–144)
BUN: 11 mg/dL (ref 7–25)
CO2: 26 mmol/L (ref 20–32)
Calcium: 9.2 mg/dL (ref 8.6–10.3)
Chloride: 107 mmol/L (ref 98–110)
Creat: 0.99 mg/dL (ref 0.70–1.25)
GFR, Est African American: 95 mL/min/{1.73_m2} (ref >=60)
GFR, Est Non African American: 82 mL/min/{1.73_m2} (ref >=60)
Globulin: 2.4 g/dL (calc) (ref 1.9–3.7)
Glucose, Bld: 101 mg/dL — ABNORMAL HIGH (ref 65–99)
Potassium: 4.4 mmol/L (ref 3.5–5.3)
Sodium: 143 mmol/L (ref 135–146)
Total Bilirubin: 0.6 mg/dL (ref 0.2–1.2)
Total Protein: 6.7 g/dL (ref 6.1–8.1)

## 2019-09-20 LAB — CBC WITH DIFFERENTIAL/PLATELET
Absolute Monocytes: 531 cells/uL (ref 200–950)
Basophils Absolute: 58 cells/uL (ref 0–200)
Basophils Relative: 0.9 %
Eosinophils Absolute: 141 cells/uL (ref 15–500)
Eosinophils Relative: 2.2 %
HCT: 45.9 % (ref 38.5–50.0)
Hemoglobin: 15.3 g/dL (ref 13.2–17.1)
Lymphs Abs: 1318 cells/uL (ref 850–3900)
MCH: 31 pg (ref 27.0–33.0)
MCHC: 33.3 g/dL (ref 32.0–36.0)
MCV: 93.1 fL (ref 80.0–100.0)
MPV: 10 fL (ref 7.5–12.5)
Monocytes Relative: 8.3 %
Neutro Abs: 4352 cells/uL (ref 1500–7800)
Neutrophils Relative %: 68 %
Platelets: 270 10*3/uL (ref 140–400)
RBC: 4.93 10*6/uL (ref 4.20–5.80)
RDW: 13.1 % (ref 11.0–15.0)
Total Lymphocyte: 20.6 %
WBC: 6.4 10*3/uL (ref 3.8–10.8)

## 2019-09-20 LAB — PSA: PSA: 0.4 ng/mL (ref <=4.0)

## 2019-09-20 NOTE — Patient Instructions (Signed)
mucinex DM by mouth twice daily with full glass of water for cough and congestion  To use Claritin (loratidine) 10 mg daily for sneezing, congestion, runny nose type symptoms

## 2019-09-20 NOTE — Progress Notes (Signed)
This service is provided via telemedicine  No vital signs collected/recorded due to the encounter was a telemedicine visit.   Location of patient (ex: home, work):  Home  Patient consents to a telephone visit:  Yes  Location of the provider (ex: office, home):  [facility]  Name of any referring provider:  Sherrie Mustache, NP  Names of all persons participating in the telemedicine service and their role in the encounter:  Bonney Leitz, [place]; Sherrie Mustache, NP; patient  Time spent on call:  3.27 minutes -  CMA time     Careteam: [name], NP as PCP - General (Nurse Practitioner) [name], [name], NP as Nurse Practitioner (Vascular Surgery) [name], MD as Consulting Physician (General Surgery) Brand Males, MD as Consulting Physician (Pulmonary Disease) [name], MD as Consulting Physician (Cardiology) [name], PA-C as Physician Assistant (Dermatology)  Advanced Directive information    No Known Allergies  Chief Complaint  Patient presents with  . Acute Visit    Patient is complaining of a cough.     HPI: Patient is a 63 y.o. male via tele-visit.  Was in the office yesterday and feeling well but at night starting with a cough, and worse this morning and throughout the day.  No shortness of breath. Reports some mild congestion.  No fever, fatigue or malaise.  Can not hear due to wax in ear. Sneezed a few times today.  No nasal congestion but has blown nose today, post Nasal drip or sore throat. Slight runny nose.  Review of Systems:  Review of Systems  Constitutional: Negative for chills, fever and malaise/fatigue.  HENT: Positive for congestion. Negative for sore throat.        Sneezing, slight runny nose  Respiratory: Positive for cough and sputum production (min). Negative for shortness of breath and wheezing.   Cardiovascular: Negative for chest pain.  Skin: Negative for itching and rash.     Past Medical History:  Diagnosis Date  . Abdominal aortic aneurysm [facility])    sees Dr. Einar Gip for cardiac f/u, [phone]  . Arthritis    lower back  . Asthma    followed by Dr. Berdie Ogren  . Chronic airway obstruction, not elsewhere classified   . Dyspnea    normal PFT April 2012  . External hemorrhoids without mention of complication   . GERD (gastroesophageal reflux disease)   . Gout   . H/O hiatal hernia   . Hemorrhoids   . HTN (hypertension)    hx of sees Dr. Graylin Shiver  . Hyperlipidemia   . Insomnia, unspecified   . Other abnormal blood chemistry   . Other dyspnea and respiratory abnormality   . Other malaise and fatigue   . Other specified erythematous condition(695.89)   . Other testicular dysfunction   . Prostatitis, unspecified   . Rash 2011   left chest, biopsy 2012 Dr. Rozann Lesches, results pending  . Skin cancer   . Tobacco abuse    Past Surgical History:  Procedure Laterality Date  . ABDOMINAL AORTIC ENDOVASCULAR STENT GRAFT  09/22/2012   Procedure: ABDOMINAL AORTIC ENDOVASCULAR STENT GRAFT;  Surgeon: Mal Misty, MD;  Location: [place];  Service: Vascular;  Laterality: N/A;  GORE; ultrasound guided.  . APPENDECTOMY    . EVALUATION UNDER ANESTHESIA WITH HEMORRHOIDECTOMY N/A 12/31/2016   Procedure: EXAM UNDER ANESTHESIA WITH HEMORRHOIDECTOMY;  Surgeon: [name], MD;  Location: [place];  Service: General;  Laterality: N/A;  . SKIN  CANCER EXCISION    . TOE SURGERY  01/2012   joint of left great toe  . TONSILLECTOMY    . TONSILLECTOMY    . TYMPANOPLASTY    . WRIST SURGERY     left   Social History:   reports that he has been smoking cigarettes. He has a 9.50 pack-year smoking history. He has quit using smokeless tobacco.  His smokeless tobacco use included snuff. He reports that he does not drink alcohol or use drugs.  Family History  Problem Relation Age of Onset  . Heart attack Father 74  . COPD Father   . Heart disease  Father   . Asthma Daughter   . Cancer Mother        Breast  . Lupus Daughter   . Diabetes Other        Grandson     Medications: Patient's Medications  New Prescriptions   No medications on file  Previous Medications   ALBUTEROL (PROVENTIL HFA;VENTOLIN HFA) 108 (90 BASE) MCG/ACT INHALER    Inhale 2 puffs into the lungs every 6 (six) hours as needed. For cough and wheezing.   ALPRAZOLAM (XANAX) 0.25 MG TABLET    Take 1 tablet (0.25 mg total) by mouth 2 (two) times daily as needed for anxiety.   ASPIRIN EC 81 MG TABLET    Take 81 mg by mouth daily.   BREO ELLIPTA 200-25 MCG/INH AEPB    INHALE 1 PUFF BY MOUTH EVERY DAY   LOSARTAN (COZAAR) 50 MG TABLET    TAKE 1 TABLET BY MOUTH ONCE DAILY TO CONTROL BLOOD PRESSURE   PANTOPRAZOLE (PROTONIX) 40 MG TABLET    TAKE 1 TABLET BY MOUTH EVERY DAY   SILDENAFIL (VIAGRA) 50 MG TABLET    Take 1 tablet (50 mg total) by mouth as needed for erectile dysfunction (DX N52.9).   SIMVASTATIN (ZOCOR) 20 MG TABLET    TAKE 1 TABLET BY MOUTH EVERYDAY AT BEDTIME  Modified Medications   No medications on file  Discontinued Medications   No medications on file    Physical Exam:  Vitals:   09/20/19 1524  Weight: 230 lb (104.3 kg)  Height: 5' 11.5" (1.816 m)   Body mass index is 31.63 kg/m. Wt Readings from Last 3 Encounters:  09/20/19 230 lb (104.3 kg)  09/19/19 230 lb (104.3 kg)  08/12/19 228 lb (103.4 kg)    Labs reviewed: Basic Metabolic Panel: Recent Labs    08/12/19 2011 09/19/19 0907  NA 140 143  K 3.7 4.4  CL 102 107  CO2 27 26  GLUCOSE 93 101*  BUN 7 11  CREATININE 0.89 0.99  CALCIUM 9.2 9.2   Liver Function Tests: Recent Labs    08/12/19 2011 09/19/19 0907  AST 26 31  ALT 34 32  ALKPHOS 79  --   BILITOT 0.8 0.6  PROT 7.2 6.7  ALBUMIN 4.2  --    Recent Labs    08/12/19 2011  LIPASE 22   No results for input(s): AMMONIA in the last 8760 hours. CBC: Recent Labs    08/12/19 2011 09/19/19 0907  WBC 9.3 6.4   NEUTROABS  --  4,352  HGB 15.7 15.3  HCT 47.6 45.9  MCV 91.2 93.1  PLT 239 270   Lipid Panel: Recent Labs    09/19/19 0907  CHOL 138  HDL 45  LDLCALC 77  TRIG 75  CHOLHDL 3.1   TSH: No results for input(s): TSH in the last 8760 hours. A1C:  Lab Results  Component Value Date   HGBA1C 6.0 (H) 08/09/2015     Assessment/Plan 1. Cough Supportive care at this time, to start mucinex DM by mouth twice daily. Appears there may be some allergy component due to symptoms of sneezing and rhinorrhea.  Advised to take loratidine 10 mg by mouth daily as well.  To notify if symptoms worsen, fever, chills, last of taste or smell, shortness of breath, wheezing occur.   Carlos American. Harle Battiest   [facility] [phone]   Virtual Visit via Telephone Note  I connected with pt on 09/20/19 at  3:15 PM EDT by telephone and verified that I am speaking with the correct person using two identifiers.  Location: Patient: home  Provider: [facility]    I discussed the limitations, risks, security and privacy concerns of performing an evaluation and management service by telephone and the availability of in person appointments. I also discussed with the patient that there may be a patient responsible charge related to this service. The patient expressed understanding and agreed to proceed.   I discussed the assessment and treatment plan with the patient. The patient was provided an opportunity to ask questions and all were answered. The patient agreed with the plan and demonstrated an understanding of the instructions.   The patient was advised to call back or seek an in-person evaluation if the symptoms worsen or if the condition fails to improve as anticipated.  I provided 11  minutes of non-face-to-face time during this encounter.  Carlos American. Harle Battiest Avs printed and mailed

## 2019-09-20 NOTE — Telephone Encounter (Signed)
Patient called asking if we could add on covid test to blood work drawn yesterday. I advised patient that we are not performing covid test here and that covid testing requires a nasal swab (not blood work). Patient verbalized understanding and stated he is only trying to please his wife. Patients wife is freaking out due to him having a newly developed cough.  Patient will call if any further assistance is needed  FYI- Patient had a telephone visit with [name], NP today.

## 2019-09-21 NOTE — Telephone Encounter (Signed)
Agree 

## 2019-09-25 ENCOUNTER — Telehealth: Payer: 59 | Admitting: Physician Assistant

## 2019-09-25 DIAGNOSIS — J069 Acute upper respiratory infection, unspecified: Secondary | ICD-10-CM | POA: Diagnosis not present

## 2019-09-25 MED ORDER — PREDNISONE 20 MG PO TABS: 40.0000 mg | ORAL_TABLET | Freq: Every day | ORAL | 0 refills | Status: DC

## 2019-09-25 MED ORDER — AZITHROMYCIN 250 MG PO TABS: ORAL_TABLET | ORAL | 0 refills | Status: DC

## 2019-09-25 MED ORDER — BENZONATATE 100 MG PO CAPS: 100.0000 mg | ORAL_CAPSULE | Freq: Two times a day (BID) | ORAL | 0 refills | Status: DC | PRN

## 2019-09-25 NOTE — Progress Notes (Signed)
We are sorry you are not feeling well.  Here is how we plan to help!  Based on what you have shared with me, it looks like you may have a viral upper respiratory infection.  Upper respiratory infections are caused by a large number of viruses; however, rhinovirus is the most common cause.   This symptoms can sometimes be caused by bacterial infections. After reviewing your information it is more likely this a viral infection rather than a bacterial source. I would expect you to start to have improvement in your symptoms in the next few days. I am going to send you a prescription for cough medication ( tessalon), Steroids ( prednisone), and an antibiotic (azithromycin). I would like for you to use the steroids and cough medication, if this does not begin to improve in the next few days then it would be reasonable to start the antibiotic.    Symptoms vary from person to person, with common symptoms including sore throat, cough, fatigue or lack of energy and feeling of general discomfort.  A low-grade fever of up to 100.4 may present, but is often uncommon.  Symptoms vary however, and are closely related to a person's age or underlying illnesses.  The most common symptoms associated with an upper respiratory infection are nasal discharge or congestion, cough, sneezing, headache and pressure in the ears and face.  These symptoms usually persist for about 3 to 10 days, but can last up to 2 weeks.  It is important to know that upper respiratory infections do not cause serious illness or complications in most cases.    Upper respiratory infections can be transmitted from person to person, with the most common method of transmission being a person's hands.  The virus is able to live on the skin and can infect other persons for up to 2 hours after direct contact.  Also, these can be transmitted when someone coughs or sneezes; thus, it is important to cover the mouth to reduce this risk.  To keep the spread of the  illness at [place], good hand hygiene is very important.  This is an infection that is most likely caused by a virus. There are no specific treatments other than to help you with the symptoms until the infection runs its course.  We are sorry you are not feeling well.  Here is how we plan to help!   For nasal congestion, you may use an oral decongestants such as Mucinex D or if you have glaucoma or high blood pressure use plain Mucinex.  Saline nasal spray or nasal drops can help and can safely be used as often as needed for congestion.   If you do not have a history of heart disease, hypertension, diabetes or thyroid disease, prostate/bladder issues or glaucoma, you may also use Sudafed to treat nasal congestion.  It is highly recommended that you consult with a pharmacist or your primary care physician to ensure this medication is safe for you to take.     If you have a cough, you may use cough suppressants such as Delsym and Robitussin.  If you have glaucoma or high blood pressure, you can also use Coricidin HBP.   For cough I have prescribed for you A prescription cough medication called Tessalon Perles 100 mg. You may take 1-2 capsules every 8 hours as needed for cough  If you have a sore or scratchy throat, use a saltwater gargle-  to  teaspoon of salt dissolved in a 4-ounce to 8-ounce  glass of warm water.  Gargle the solution for approximately 15-30 seconds and then spit.  It is important not to swallow the solution.  You can also use throat lozenges/cough drops and Chloraseptic spray to help with throat pain or discomfort.  Warm or cold liquids can also be helpful in relieving throat pain.  For headache, pain or general discomfort, you can use Ibuprofen or Tylenol as directed.   Some authorities believe that zinc sprays or the use of Echinacea may shorten the course of your symptoms.   HOME CARE . Only take medications as instructed by your medical team. . Be sure to drink plenty of fluids.  Water is fine as well as fruit juices, sodas and electrolyte beverages. You may want to stay away from caffeine or alcohol. If you are nauseated, try taking small sips of liquids. How do you know if you are getting enough fluid? Your urine should be a pale yellow or almost colorless. . Get rest. . Taking a steamy shower or using a humidifier may help nasal congestion and ease sore throat pain. You can place a towel over your head and breathe in the steam from hot water coming from a faucet. . Using a saline nasal spray works much the same way. . Cough drops, hard candies and sore throat lozenges may ease your cough. . Avoid close contacts especially the very young and the elderly . Cover your mouth if you cough or sneeze . Always remember to wash your hands.   GET HELP RIGHT AWAY IF: . You develop worsening fever. . If your symptoms do not improve within 10 days . You develop yellow or green discharge from your nose over 3 days. . You have coughing fits . You develop a severe head ache or visual changes. . You develop shortness of breath, difficulty breathing or start having chest pain . Your symptoms persist after you have completed your treatment plan  MAKE SURE YOU   Understand these instructions.  Will watch your condition.  Will get help right away if you are not doing well or get worse.  Your e-visit answers were reviewed by a board certified advanced clinical practitioner to complete your personal care plan. Depending upon the condition, your plan could have included both over the counter or prescription medications. Please review your pharmacy choice. If there is a problem, you may call our nursing hot line at and have the prescription routed to another pharmacy. Your safety is important to [place]. If you have drug allergies check your prescription carefully.   You can use MyChart to ask questions about today's visit, request a non-urgent call back, or ask for a work or school  excuse for 24 hours related to this e-Visit. If it has been greater than 24 hours you will need to follow up with your provider, or enter a new e-Visit to address those concerns. You will get an e-mail in the next two days asking about your experience.  I hope that your e-visit has been valuable and will speed your recovery. Thank you for using e-visits.  Greater than 5 minutes, yet less than 10 minutes of time have been spent researching, coordinating, and implementing care for this patient today

## 2019-09-27 ENCOUNTER — Other Ambulatory Visit: Payer: Self-pay

## 2019-09-27 ENCOUNTER — Encounter: Payer: Self-pay | Admitting: Gastroenterology

## 2019-09-27 ENCOUNTER — Ambulatory Visit: Payer: 59 | Admitting: Gastroenterology

## 2019-09-27 VITALS — BP 122/78 | HR 76 | Temp 97.8°F | Ht 71.5 in | Wt 235.0 lb

## 2019-09-27 DIAGNOSIS — Z1159 Encounter for screening for other viral diseases: Secondary | ICD-10-CM

## 2019-09-27 DIAGNOSIS — R935 Abnormal findings on diagnostic imaging of other abdominal regions, including retroperitoneum: Secondary | ICD-10-CM

## 2019-09-27 DIAGNOSIS — K439 Ventral hernia without obstruction or gangrene: Secondary | ICD-10-CM

## 2019-09-27 DIAGNOSIS — R1013 Epigastric pain: Secondary | ICD-10-CM | POA: Diagnosis not present

## 2019-09-27 DIAGNOSIS — Z1211 Encounter for screening for malignant neoplasm of colon: Secondary | ICD-10-CM | POA: Diagnosis not present

## 2019-09-27 DIAGNOSIS — Z716 Tobacco abuse counseling: Secondary | ICD-10-CM

## 2019-09-27 MED ORDER — NA SULFATE-K SULFATE-MG SULF 17.5-3.13-1.6 GM/177ML PO SOLN: ORAL | 0 refills | Status: DC

## 2019-09-27 NOTE — Progress Notes (Signed)
Aaron [name]    [id number]    [birth year]  Primary Care Physician:Eubanks, Carlos American, NP  Referring Physician: [name], NP [place],  [place] [address]   Chief complaint: Abdominal bloating, epigastric abdominal pain HPI: 63 year old male with history of AAA endovascular repair here for follow-up visit with complaints of epigastric abdominal pain.  He was recently in ER last month with abdominal pain, CT abdomen pelvis angio August 12, 2019 showed patent aortoiliac stent graft, small amount of eccentric mural thrombus at the proximal end of the stent graft with mild aneurysmal dilation at the proximal end.  Mild extrinsic mass affect on celiac trunk and indistinct density surrounding celiac trunk and its bifurcation into hepatic and splenic arteries possible inflammatory process. He denies any weight loss.  He restarted smoking due to increased stress. He has not had any further episodes of abdominal pain.  No significant change in bowel habits, melena or blood per rectum. No family history of GI malignancy. He has follow-up appointment with vascular surgery next month to discuss findings of CT angio.  Colonoscopy October 10, 2009 by Dr. Deatra Ina: 2 sessile subcentimeter hyperplastic polyps removed from rectosigmoid and descending colon otherwise normal exam.  Outpatient Encounter Medications as of 09/27/2019  Medication Sig  . albuterol (PROVENTIL HFA;VENTOLIN HFA) 108 (90 Base) MCG/ACT inhaler Inhale 2 puffs into the lungs every 6 (six) hours as needed. For cough and wheezing.  Marland Kitchen ALPRAZolam (XANAX) 0.25 MG tablet Take 1 tablet (0.25 mg total) by mouth 2 (two) times daily as needed for anxiety.  Marland Kitchen aspirin EC 81 MG tablet Take 81 mg by mouth daily.  Marland Kitchen azithromycin (ZITHROMAX) 250 MG tablet Please take two tabs on day one followed by one per day for 4 days  . benzonatate (TESSALON) 100 MG capsule Take 1 capsule (100 mg total) by mouth 2 (two)  times daily as needed for cough.  Marland Kitchen BREO ELLIPTA 200-25 MCG/INH AEPB INHALE 1 PUFF BY MOUTH EVERY DAY  . losartan (COZAAR) 50 MG tablet TAKE 1 TABLET BY MOUTH ONCE DAILY TO CONTROL BLOOD PRESSURE  . pantoprazole (PROTONIX) 40 MG tablet TAKE 1 TABLET BY MOUTH EVERY DAY  . predniSONE (DELTASONE) 20 MG tablet Take 2 tablets (40 mg total) by mouth daily with breakfast.  . sildenafil (VIAGRA) 50 MG tablet Take 1 tablet (50 mg total) by mouth as needed for erectile dysfunction (DX N52.9).  Marland Kitchen simvastatin (ZOCOR) 20 MG tablet TAKE 1 TABLET BY MOUTH EVERYDAY AT BEDTIME   No facility-administered encounter medications on file as of 09/27/2019.     Allergies as of 09/27/2019  . (No Known Allergies)    Past Medical History:  Diagnosis Date  . Abdominal aortic aneurysm [facility])    sees Dr. Einar Gip for cardiac f/u, [phone]  . Arthritis    lower back  . Asthma    followed by Dr. Berdie Ogren  . Chronic airway obstruction, not elsewhere classified   . Dyspnea    normal PFT April 2012  . External hemorrhoids without mention of complication   . GERD (gastroesophageal reflux disease)   . Gout   . H/O hiatal hernia   . Hemorrhoids   . HTN (hypertension)    hx of sees Dr. Graylin Shiver  . Hyperlipidemia   . Insomnia, unspecified   . Other abnormal blood chemistry   . Other dyspnea and respiratory abnormality   . Other malaise  and fatigue   . Other specified erythematous condition(695.89)   . Other testicular dysfunction   . Prostatitis, unspecified   . Rash 2011   left chest, biopsy 2012 Dr. Rozann Lesches, results pending  . Skin cancer   . Tobacco abuse     Past Surgical History:  Procedure Laterality Date  . ABDOMINAL AORTIC ENDOVASCULAR STENT GRAFT  09/22/2012   Procedure: ABDOMINAL AORTIC ENDOVASCULAR STENT GRAFT;  Surgeon: Mal Misty, MD;  Location: [place];  Service: Vascular;  Laterality: N/A;  GORE; ultrasound guided.  . APPENDECTOMY    . EVALUATION UNDER ANESTHESIA WITH  HEMORRHOIDECTOMY N/A 12/31/2016   Procedure: EXAM UNDER ANESTHESIA WITH HEMORRHOIDECTOMY;  Surgeon: [name], MD;  Location: [place];  Service: General;  Laterality: N/A;  . SKIN CANCER EXCISION    . TOE SURGERY  01/2012   joint of left great toe  . TONSILLECTOMY    . TONSILLECTOMY    . TYMPANOPLASTY    . WRIST SURGERY     left    Family History  Problem Relation Age of Onset  . Heart attack Father 38  . COPD Father   . Heart disease Father   . Asthma Daughter   . Cancer Mother        Breast  . Lupus Daughter   . Diabetes Other        Grandson     Social History   Socioeconomic History  . Marital status: Married    Spouse name: Not on file  . Number of children: Not on file  . Years of education: Not on file  . Highest education level: Not on file  Occupational History  . Occupation: Forensic psychologist: [employer]  . Financial resource strain: Not on file  . Food insecurity    Worry: Not on file    Inability: Not on file  . Transportation needs    Medical: Not on file    Non-medical: Not on file  Tobacco Use  . Smoking status: Current Every Day Smoker    Packs/day: 0.25    Years: 38.00    Pack years: 9.50    Types: Cigarettes    Last attempt to quit: 09/02/2012    Years since quitting: 7.0  . Smokeless tobacco: Former Systems developer    Types: Snuff  Substance and Sexual Activity  . Alcohol use: No    Alcohol/week: 0.0 standard drinks    Comment: past ETOH use quit 11-26-2009  . Drug use: No  . Sexual activity: Not on file  Lifestyle  . Physical activity    Days per week: Not on file    Minutes per session: Not on file  . Stress: Not on file  Relationships  . Social Herbalist on phone: Not on file    Gets together: Not on file    Attends religious service: Not on file    Active member of club or organization: Not on file    Attends meetings of clubs or organizations: Not on file    Relationship status: Not on  file  . Intimate partner violence    Fear of current or ex partner: Not on file    Emotionally abused: Not on file    Physically abused: Not on file    Forced sexual activity: Not on file  Other Topics Concern  . Not on file  Social History Narrative  . Not on file  Review of systems: Review of Systems  Constitutional: Negative for fever and chills.  HENT: Negative.   Eyes: Negative for blurred vision.  Respiratory: Negative for cough, shortness of breath and wheezing.   Cardiovascular: Negative for chest pain and palpitations.  Gastrointestinal: as per HPI Genitourinary: Negative for dysuria, urgency, frequency and hematuria.  Musculoskeletal: Negative for myalgias, back pain and joint pain.  Skin: Negative for itching and rash.  Neurological: Negative for dizziness, tremors, focal weakness, seizures and loss of consciousness.  Endo/Heme/Allergies: Positive for seasonal allergies.  Psychiatric/Behavioral: Negative for depression, suicidal ideas and hallucinations.  All other systems reviewed and are negative.   Physical Exam: Vitals:   09/27/19 0819  BP: 122/78  Pulse: 76  Temp: 97.8 F (36.6 C)   Body mass index is 32.32 kg/m. Gen:      No acute distress HEENT:  EOMI, sclera anicteric Neck:     No masses; no thyromegaly Lungs:    Clear to auscultation bilaterally; normal respiratory effort CV:         Regular rate and rhythm; no murmurs Abd:      + bowel sounds; soft, non-tender; no palpable masses, no distension Ext:    No edema; adequate peripheral perfusion Skin:      Warm and dry; no rash Neuro: alert and oriented x 3 Psych: normal mood and affect  Data Reviewed:  Reviewed labs, radiology imaging, old records and pertinent past GI work up   Assessment and Plan/Recommendations:  63 year old male with history of hypertension, hyperlipidemia, AAA endovascular repair with aortoiliac stent graft with complaints of epigastric abdominal pain and bloating   We will schedule for EGD to exclude peptic ulcer disease He is also due for colorectal cancer screening, schedule colonoscopy along with EGD The risks and benefits as well as alternatives of endoscopic procedure(s) have been discussed and reviewed. All questions answered. The patient agrees to proceed.  Reviewed recent CT angio abdomen and pelvis, showed nonspecific extrinsic density surrounding celiac trunk, he has follow-up visit with vascular surgery next month.  Will consider repeat imaging based on recommendations from vascular surgery.  Discussed smoking cessation in detail with patient  Abdominal ventral hernia: Stable Advised patient to avoid lifting heavy weights and to use support brace if needed.  We will continue to monitor.    Damaris Hippo , MD    CC: [name], NP

## 2019-09-27 NOTE — Patient Instructions (Signed)
You have been scheduled for an endoscopy and colonoscopy. Please follow the written instructions given to you at your visit today. Please pick up your prep supplies at the pharmacy within the next 1-3 days. If you use inhalers (even only as needed), please bring them with you on the day of your procedure.   If you are age 61 or older, your body mass index should be between 23-30. Your Body mass index is 32.32 kg/m. If this is out of the aforementioned range listed, please consider follow up with your Primary Care Provider.  If you are age 83 or younger, your body mass index should be between 19-25. Your Body mass index is 32.32 kg/m. If this is out of the aformentioned range listed, please consider follow up with your Primary Care Provider.    I appreciate the  opportunity to care for you  Thank You   Harl Bowie , MD

## 2019-09-30 ENCOUNTER — Encounter: Payer: Self-pay | Admitting: Gastroenterology

## 2019-10-03 LAB — SARS CORONAVIRUS 2 (TAT 6-24 HRS): SARS Coronavirus 2: NEGATIVE

## 2019-10-04 ENCOUNTER — Encounter: Payer: Self-pay | Admitting: Gastroenterology

## 2019-10-04 ENCOUNTER — Other Ambulatory Visit: Payer: Self-pay

## 2019-10-04 ENCOUNTER — Ambulatory Visit (AMBULATORY_SURGERY_CENTER): Payer: 59 | Admitting: Gastroenterology

## 2019-10-04 VITALS — BP 130/79 | HR 56 | Temp 98.0°F | Resp 16 | Ht 71.0 in | Wt 235.0 lb

## 2019-10-04 DIAGNOSIS — D128 Benign neoplasm of rectum: Secondary | ICD-10-CM | POA: Diagnosis not present

## 2019-10-04 DIAGNOSIS — R109 Unspecified abdominal pain: Secondary | ICD-10-CM

## 2019-10-04 DIAGNOSIS — D123 Benign neoplasm of transverse colon: Secondary | ICD-10-CM

## 2019-10-04 DIAGNOSIS — Z1211 Encounter for screening for malignant neoplasm of colon: Secondary | ICD-10-CM | POA: Diagnosis not present

## 2019-10-04 DIAGNOSIS — K2970 Gastritis, unspecified, without bleeding: Secondary | ICD-10-CM | POA: Diagnosis not present

## 2019-10-04 DIAGNOSIS — K3189 Other diseases of stomach and duodenum: Secondary | ICD-10-CM

## 2019-10-04 DIAGNOSIS — K222 Esophageal obstruction: Secondary | ICD-10-CM | POA: Diagnosis not present

## 2019-10-04 DIAGNOSIS — D129 Benign neoplasm of anus and anal canal: Secondary | ICD-10-CM

## 2019-10-04 MED ORDER — SODIUM CHLORIDE 0.9 % IV SOLN: 500.0000 mL | Freq: Once | INTRAVENOUS | Status: DC

## 2019-10-04 NOTE — Op Note (Signed)
[facility] Patient Name: Aaron [name] Procedure Date: 10/04/2019 10:13 AM MRN: [id number] Endoscopist: Mauri Pole , MD Age: 63 Referring MD:  Date of Birth: 1956/10/16 Gender: Male Account #: [id number] Procedure:                Upper GI endoscopy Indications:              Epigastric abdominal pain Medicines:                Monitored Anesthesia Care Procedure:                Pre-Anesthesia Assessment:                           - Prior to the procedure, a History and Physical                            was performed, and patient medications and                            allergies were reviewed. The patient's tolerance of                            previous anesthesia was also reviewed. The risks                            and benefits of the procedure and the sedation                            options and risks were discussed with the patient.                            All questions were answered, and informed consent                            was obtained. Prior Anticoagulants: The patient has                            taken no previous anticoagulant or antiplatelet                            agents. ASA Grade Assessment: III - A patient with                            severe systemic disease. After reviewing the risks                            and benefits, the patient was deemed in                            satisfactory condition to undergo the procedure.                           - Prior to the procedure, a History and Physical  was performed, and patient medications and                            allergies were reviewed. The patient's tolerance of                            previous anesthesia was also reviewed. The risks                            and benefits of the procedure and the sedation                            options and risks were discussed with the patient.                            All questions were  answered, and informed consent                            was obtained. Prior Anticoagulants: The patient has                            taken no previous anticoagulant or antiplatelet                            agents. ASA Grade Assessment: III - A patient with                            severe systemic disease. After reviewing the risks                            and benefits, the patient was deemed in                            satisfactory condition to undergo the procedure.                           After obtaining informed consent, the endoscope was                            passed under direct vision. Throughout the                            procedure, the patient's blood pressure, pulse, and                            oxygen saturations were monitored continuously. The                            Endoscope was introduced through the mouth, and                            advanced to the second part of duodenum. The upper  GI endoscopy was accomplished without difficulty.                            The patient tolerated the procedure well. Scope In: Scope Out: Findings:                 The esophagus was normal.                           The Z-line was regular and was found 40 cm from the                            incisors.                           Patchy moderate inflammation characterized by                            congestion (edema), erythema, friability and mucus                            was found in the entire examined stomach. Biopsies                            were taken with a cold forceps for Helicobacter                            pylori testing.                           Clear fluid was found in the gastric body.                           A benign-appearing, intrinsic mild stenosis was                            found at the pylorus. This was traversed. Mild heme                            noted at pyloric channel after traversing through                             it with minimal resistance                           The examined duodenum was normal. Complications:            No immediate complications. Estimated Blood Loss:     Estimated blood loss was minimal. Impression:               - Normal esophagus.                           - Z-line regular, 40 cm from the incisors.                           - Gastritis. Biopsied.                           -  Clear gastric fluid.                           - Gastric stenosis was found at the pylorus.                           - Normal examined duodenum. Recommendation:           - Patient has a contact number available for                            emergencies. The signs and symptoms of potential                            delayed complications were discussed with the                            patient. Return to normal activities tomorrow.                            Written discharge instructions were provided to the                            patient.                           - Resume previous diet.                           - Continue present medications.                           - Await pathology results.                           - Repeat upper endoscopy PRN for                            retreatment/dilation of pyloric channel.                           - Low fat and low fiber diet.                           - Return to GI office in 3 months. Mauri Pole, MD 10/04/2019 11:01:28 AM This report has been signed electronically.

## 2019-10-04 NOTE — Patient Instructions (Signed)
HANDOUTS PROVIDED ON: POLYPS, DIVERTICULOSIS, HEMORRHOIDS, & GASTRITIS  THE POLYPS & BIOPSIES TAKEN TODAY HAVE BEEN SENT FOR PATHOLOGY.  THE RESULTS CAN TAKE 2-3 WEEKS TO RECEIVE.  BASED ON THE RESULTS IS WHEN YOUR NEXT COLONOSCOPY WILL BE RECOMMENDED.  YOU MAY RESUME YOUR PREVIOUS DIET AND MEDICATION SCHEDULE.  IT WOULD BE BENEFICIAL TO INCORPORATE A LOW FAT AND LOW FIBER DIET.  RETURN TO THE GI OFFICE IN 3 MONTHS.  [facility] YOU FOR ALLOWING [place] TO CARE FOR YOU TODAY!!!  YOU HAD AN ENDOSCOPIC PROCEDURE TODAY AT [facility]:   Refer to the procedure report that was given to you for any specific questions about what was found during the examination.  If the procedure report does not answer your questions, please call your gastroenterologist to clarify.  If you requested that your care partner not be given the details of your procedure findings, then the procedure report has been included in a sealed envelope for you to review at your convenience later.  YOU SHOULD EXPECT: Some feelings of bloating in the abdomen. Passage of more gas than usual.  Walking can help get rid of the air that was put into your GI tract during the procedure and reduce the bloating. If you had a lower endoscopy (such as a colonoscopy or flexible sigmoidoscopy) you may notice spotting of blood in your stool or on the toilet paper. If you underwent a bowel prep for your procedure, you may not have a normal bowel movement for a few days.  Please Note:  You might notice some irritation and congestion in your nose or some drainage.  This is from the oxygen used during your procedure.  There is no need for concern and it should clear up in a day or so.  SYMPTOMS TO REPORT IMMEDIATELY:   Following lower endoscopy (colonoscopy or flexible sigmoidoscopy):  Excessive amounts of blood in the stool  Significant tenderness or worsening of abdominal pains  Swelling of the abdomen that is new, acute  Fever of 100F or  higher   Following upper endoscopy (EGD)  Vomiting of blood or coffee ground material  New chest pain or pain under the shoulder blades  Painful or persistently difficult swallowing  New shortness of breath  Fever of 100F or higher  Black, tarry-looking stools  For urgent or emergent issues, a gastroenterologist can be reached at any hour by calling [phone].   DIET:  We do recommend a small meal at first, but then you may proceed to your regular diet.  Drink plenty of fluids but you should avoid alcoholic beverages for 24 hours.  ACTIVITY:  You should plan to take it easy for the rest of today and you should NOT DRIVE or use heavy machinery until tomorrow (because of the sedation medicines used during the test).    FOLLOW UP: Our staff will call the number listed on your records 48-72 hours following your procedure to check on you and address any questions or concerns that you may have regarding the information given to you following your procedure. If we do not reach you, we will leave a message.  We will attempt to reach you two times.  During this call, we will ask if you have developed any symptoms of COVID 19. If you develop any symptoms (ie: fever, flu-like symptoms, shortness of breath, cough etc.) before then, please call [phone].  If you test positive for Covid 19 in the 2 weeks post procedure, please call and report  this information to [place].    If any biopsies were taken you will be contacted by phone or by letter within the next 1-3 weeks.  Please call us at [phone] if you have not heard about the biopsies in 3 weeks.    SIGNATURES/CONFIDENTIALITY: You and/or your care partner have signed paperwork which will be entered into your electronic medical record.  These signatures attest to the fact that that the information above on your After Visit Summary has been reviewed and is understood.  Full responsibility of the confidentiality of this discharge information  lies with you and/or your care-partner.

## 2019-10-04 NOTE — Progress Notes (Signed)
Called to room to assist during endoscopic procedure.  Patient ID and intended procedure confirmed with present staff. Received instructions for my participation in the procedure from the performing physician.  

## 2019-10-04 NOTE — Op Note (Signed)
[facility] Patient Name: Aaron [name] Procedure Date: 10/04/2019 10:13 AM MRN: [id number] Endoscopist: Mauri Pole , MD Age: 63 Referring MD:  Date of Birth: 03-30-1956 Gender: Male Account #: [id number] Procedure:                Colonoscopy Indications:              Screening for colorectal malignant neoplasm Medicines:                Monitored Anesthesia Care Procedure:                Pre-Anesthesia Assessment:                           - Prior to the procedure, a History and Physical                            was performed, and patient medications and                            allergies were reviewed. The patient's tolerance of                            previous anesthesia was also reviewed. The risks                            and benefits of the procedure and the sedation                            options and risks were discussed with the patient.                            All questions were answered, and informed consent                            was obtained. Prior Anticoagulants: The patient has                            taken no previous anticoagulant or antiplatelet                            agents. ASA Grade Assessment: III - A patient with                            severe systemic disease. After reviewing the risks                            and benefits, the patient was deemed in                            satisfactory condition to undergo the procedure.                           After obtaining informed consent, the colonoscope  was passed under direct vision. Throughout the                            procedure, the patient's blood pressure, pulse, and                            oxygen saturations were monitored continuously. The                            Colonoscope was introduced through the anus and                            advanced to the the cecum, identified by                            appendiceal  orifice and ileocecal valve. The                            colonoscopy was performed without difficulty. The                            patient tolerated the procedure well. The quality                            of the bowel preparation was good. The ileocecal                            valve, appendiceal orifice, and rectum were                            photographed. Scope In: 10:28:35 AM Scope Out: 10:49:50 AM Scope Withdrawal Time: 0 hours 15 minutes 10 seconds  Total Procedure Duration: 0 hours 21 minutes 15 seconds  Findings:                 The perianal and digital rectal examinations were                            normal.                           Three sessile polyps were found in the rectum and                            transverse colon. The polyps were 1 to 2 mm in                            size. These polyps were removed with a cold biopsy                            forceps. Resection and retrieval were complete.                           A 7 mm polyp was found in the transverse colon. The  polyp was sessile. The polyp was removed with a                            cold snare. Resection and retrieval were complete.                           Scattered small-mouthed diverticula were found in                            the sigmoid colon, transverse colon and ascending                            colon.                           Non-bleeding internal hemorrhoids were found during                            retroflexion. The hemorrhoids were medium-sized.                           The exam was otherwise without abnormality. Complications:            No immediate complications. Estimated Blood Loss:     Estimated blood loss was minimal. Impression:               - Three 1 to 2 mm polyps in the rectum and in the                            transverse colon, removed with a cold biopsy                            forceps. Resected and retrieved.                            - One 7 mm polyp in the transverse colon, removed                            with a cold snare. Resected and retrieved.                           - Mild diverticulosis in the sigmoid colon, in the                            transverse colon and in the ascending colon.                           - Non-bleeding internal hemorrhoids.                           - The examination was otherwise normal. Recommendation:           - Patient has a contact number available for  emergencies. The signs and symptoms of potential                            delayed complications were discussed with the                            patient. Return to normal activities tomorrow.                            Written discharge instructions were provided to the                            patient.                           - Resume previous diet.                           - Continue present medications.                           - Await pathology results.                           - Repeat colonoscopy in 3 - 5 years for                            surveillance based on pathology results. Mauri Pole, MD 10/04/2019 10:55:57 AM This report has been signed electronically.

## 2019-10-04 NOTE — Progress Notes (Signed)
Pt's states no medical or surgical changes since previsit or office visit.  JB - temp CW - vitals. 

## 2019-10-04 NOTE — Progress Notes (Signed)
Pt tolerated well. Teeth unchanged. VSS. Arousable and to recovery.

## 2019-10-06 ENCOUNTER — Telehealth: Payer: Self-pay | Admitting: *Deleted

## 2019-10-06 NOTE — Telephone Encounter (Signed)
1. Have you developed a fever since your procedure? no  2.   Have you had an respiratory symptoms (SOB or cough) since your procedure? no  3.   Have you tested positive for COVID 19 since your procedure no  4.   Have you had any family members/close contacts diagnosed with the COVID 19 since your procedure?  no   If yes to any of these questions please route to Joylene John, RN and Alphonsa Gin, Therapist, sports.     Follow up Call-  Call back number 10/04/2019  Post procedure Call Back phone  # [phone]  Permission to leave phone message Yes  Some recent data might be hidden     Patient questions:  Do you have a fever, pain , or abdominal swelling? No. Pain Score  0 *  Have you tolerated food without any problems? Yes.    Have you been able to return to your normal activities? Yes.    Do you have any questions about your discharge instructions: Diet   No. Medications  No. Follow up visit  Yes.    Do you have questions or concerns about your Care? Yes.     Pt said that he had asked Dr. Silverio Decamp to speak with his wife after the procedure regarding the findings. He still has several questions about his care and the report. Will let Dr. Silverio Decamp know. Told the patient that he would receive a letter in the mail regarding the results from the pathology that we did during his exam In the next week or so. Pt verbalized understanding.  Actions: * If pain score is 4 or above: No action needed, pain <4.

## 2019-10-06 NOTE — Telephone Encounter (Signed)
Called his wife, unable to reach.  I called him, he is on the road driving and his wife is not with him.  I left a message for his wife to call me back.  Thanks

## 2019-10-06 NOTE — Telephone Encounter (Signed)
Pt called returning your call. Please call her at [facility]

## 2019-10-06 NOTE — Telephone Encounter (Signed)
I called his wife [name] back.  Answered all her questions to her satisfaction.  She was thankful and very appreciative for the care he has received.

## 2019-10-11 ENCOUNTER — Encounter: Payer: Self-pay | Admitting: Gastroenterology

## 2019-12-23 ENCOUNTER — Other Ambulatory Visit: Payer: Self-pay | Admitting: Nurse Practitioner

## 2019-12-23 DIAGNOSIS — K219 Gastro-esophageal reflux disease without esophagitis: Secondary | ICD-10-CM

## 2019-12-23 DIAGNOSIS — F419 Anxiety disorder, unspecified: Secondary | ICD-10-CM

## 2019-12-23 NOTE — Telephone Encounter (Signed)
Ok will add note to next office visit to update contract, I can not approve.

## 2019-12-23 NOTE — Telephone Encounter (Signed)
Okay to approve but he will need to come in and sign non-opioid contract before another refill will be given (can be done at next OV if needed)

## 2019-12-23 NOTE — Telephone Encounter (Signed)
Last filled 08/16/2019

## 2019-12-26 ENCOUNTER — Other Ambulatory Visit: Payer: Self-pay | Admitting: Nurse Practitioner

## 2019-12-26 DIAGNOSIS — N529 Male erectile dysfunction, unspecified: Secondary | ICD-10-CM

## 2020-02-20 ENCOUNTER — Other Ambulatory Visit: Payer: Self-pay | Admitting: Nurse Practitioner

## 2020-02-20 ENCOUNTER — Other Ambulatory Visit: Payer: Self-pay | Admitting: *Deleted

## 2020-02-20 DIAGNOSIS — I714 Abdominal aortic aneurysm, without rupture, unspecified: Secondary | ICD-10-CM

## 2020-02-20 DIAGNOSIS — E782 Mixed hyperlipidemia: Secondary | ICD-10-CM

## 2020-02-21 ENCOUNTER — Ambulatory Visit (INDEPENDENT_AMBULATORY_CARE_PROVIDER_SITE_OTHER): Payer: 59 | Admitting: Vascular Surgery

## 2020-02-21 ENCOUNTER — Ambulatory Visit: Payer: 59 | Admitting: Vascular Surgery

## 2020-02-21 ENCOUNTER — Ambulatory Visit (HOSPITAL_COMMUNITY)
Admission: RE | Admit: 2020-02-21 | Discharge: 2020-02-21 | Disposition: A | Discharge status: Home / Self Care | Payer: 59 | Source: Ambulatory Visit | Attending: Vascular Surgery | Admitting: Vascular Surgery

## 2020-02-21 ENCOUNTER — Encounter: Payer: Self-pay | Admitting: Vascular Surgery

## 2020-02-21 ENCOUNTER — Other Ambulatory Visit: Payer: Self-pay

## 2020-02-21 ENCOUNTER — Ambulatory Visit (HOSPITAL_COMMUNITY): Payer: 59

## 2020-02-21 VITALS — BP 138/86 | HR 57 | Temp 98.0°F | Resp 18 | Ht 74.0 in | Wt 227.0 lb

## 2020-02-21 DIAGNOSIS — I714 Abdominal aortic aneurysm, without rupture, unspecified: Secondary | ICD-10-CM

## 2020-02-21 NOTE — Progress Notes (Signed)
Patient name: Aaron [name] MRN: [id number] DOB: [birth year] Sex: male  REASON FOR VISIT: 1 year follow-up for EVAR surveillance  HPI: Aaron [name] is a 64 y.o. male presents for one year follow-up for surveillance status post EVAR in 2013 by Dr. Kellie Simmering for a 5.1 cm infrarenal aneurysm.   Over the last year he reports some ongoing gastrointestinal issues particularly bloating and dark stools according to the patient. No specific abdominal pain. No specific postprandial pain. Worse with hot dogs according to the patient and his wife.  He was last seen for surveillance last year given concern for growth of the aneurysm given ultrasound measurements increased from 4.2 cm to 4.9 cm.  Limited visibility given overlying bowel gas.  A CTA was ultimately ordered to further evaluate that confirmed no growth on 01/07/19.  Past Medical History:  Diagnosis Date  . Abdominal aortic aneurysm [facility])    sees Dr. Einar Gip for cardiac f/u, [phone]  . Arthritis    lower back  . Asthma    followed by Dr. Berdie Ogren  . Chronic airway obstruction, not elsewhere classified   . Dyspnea    normal PFT April 2012  . External hemorrhoids without mention of complication   . GERD (gastroesophageal reflux disease)   . Gout   . H/O hiatal hernia   . Hemorrhoids   . HTN (hypertension)    hx of sees Dr. Graylin Shiver  . Hyperlipidemia   . Insomnia, unspecified   . Other abnormal blood chemistry   . Other dyspnea and respiratory abnormality   . Other malaise and fatigue   . Other specified erythematous condition(695.89)   . Other testicular dysfunction   . Prostatitis, unspecified   . Rash 2011   left chest, biopsy 2012 Dr. Rozann Lesches, results pending  . Skin cancer   . Tobacco abuse     Past Surgical History:  Procedure Laterality Date  . ABDOMINAL AORTIC ENDOVASCULAR STENT GRAFT  09/22/2012   Procedure: ABDOMINAL AORTIC ENDOVASCULAR STENT GRAFT;  Surgeon: Mal Misty, MD;  Location: [place];  Service:  Vascular;  Laterality: N/A;  GORE; ultrasound guided.  . APPENDECTOMY    . EVALUATION UNDER ANESTHESIA WITH HEMORRHOIDECTOMY N/A 12/31/2016   Procedure: EXAM UNDER ANESTHESIA WITH HEMORRHOIDECTOMY;  Surgeon: [name], MD;  Location: [place];  Service: General;  Laterality: N/A;  . SKIN CANCER EXCISION    . TOE SURGERY  01/2012   joint of left great toe  . TONSILLECTOMY    . TONSILLECTOMY    . TYMPANOPLASTY    . WRIST SURGERY     left    Family History  Problem Relation Age of Onset  . Heart attack Father 92  . COPD Father   . Heart disease Father   . Asthma Daughter   . Cancer Mother        Breast  . Lupus Daughter   . Diabetes Other        Grandson   . Colon cancer Neg Hx   . Pancreatic cancer Neg Hx   . Esophageal cancer Neg Hx   . Stomach cancer Neg Hx   . Rectal cancer Neg Hx     SOCIAL HISTORY: Social History   Tobacco Use  . Smoking status: Current Every Day Smoker    Packs/day: 0.25    Years: 38.00    Pack years: 9.50    Types: Cigarettes    Last attempt to quit: 09/02/2012    Years since quitting: 7.4  .  Smokeless tobacco: Former Systems developer    Types: Snuff  Substance Use Topics  . Alcohol use: Yes    Alcohol/week: 0.0 standard drinks    Comment: occ    No Known Allergies  Current Outpatient Medications  Medication Sig Dispense Refill  . albuterol (PROVENTIL HFA;VENTOLIN HFA) 108 (90 Base) MCG/ACT inhaler Inhale 2 puffs into the lungs every 6 (six) hours as needed. For cough and wheezing. 24 g 3  . ALPRAZolam (XANAX) 0.25 MG tablet TAKE 1 TABLET (0.25 MG TOTAL) BY MOUTH 2 (TWO) TIMES DAILY AS NEEDED FOR ANXIETY. 20 tablet 0  . aspirin EC 81 MG tablet Take 81 mg by mouth daily.    Marland Kitchen BREO ELLIPTA 200-25 MCG/INH AEPB INHALE 1 PUFF BY MOUTH EVERY DAY 60 each 5  . losartan (COZAAR) 50 MG tablet TAKE 1 TABLET BY MOUTH ONCE DAILY TO CONTROL BLOOD PRESSURE 90 tablet 1  . pantoprazole (PROTONIX) 40 MG tablet TAKE 1 TABLET BY MOUTH EVERY DAY  90 tablet 1  . sildenafil (VIAGRA) 50 MG tablet TAKE 1 TABLET BY MOUTH AS NEEDED FOR ERECTILE DYSFUNCTION (DX N52.9). 3 tablet 3  . simvastatin (ZOCOR) 20 MG tablet TAKE 1 TABLET BY MOUTH EVERYDAY AT BEDTIME 90 tablet 1   No current facility-administered medications for this visit.    REVIEW OF SYSTEMS:  [X]  denotes positive finding, [ ]  denotes negative finding Cardiac  Comments:  Chest pain or chest pressure:    Shortness of breath upon exertion:    Short of breath when lying flat:    Irregular heart rhythm:        Vascular    Pain in calf, thigh, or hip brought on by ambulation:    Pain in feet at night that wakes you up from your sleep:     Blood clot in your veins:    Leg swelling:         Pulmonary    Oxygen at home:    Productive cough:     Wheezing:         Neurologic    Sudden weakness in arms or legs:     Sudden numbness in arms or legs:     Sudden onset of difficulty speaking or slurred speech:    Temporary loss of vision in one eye:     Problems with dizziness:         Gastrointestinal    Blood in stool:     Vomited blood:         Genitourinary    Burning when urinating:     Blood in urine:        Psychiatric    Major depression:         Hematologic    Bleeding problems:    Problems with blood clotting too easily:        Skin    Rashes or ulcers:        Constitutional    Fever or chills:      PHYSICAL EXAM: Vitals:   02/21/20 0831  BP: 138/86  Pulse: (!) 57  Resp: 18  Temp: 98 F (36.7 C)  SpO2: 97%  Weight: 227 lb (103 kg)  Height: 6\' 2"  (1.88 m)    GENERAL: The patient is a well-nourished male, in no acute distress. The vital signs are documented above. CARDIAC: There is a regular rate and rhythm.  VASCULAR:  Palpable femoral pulses bilaterally Palpable DP pulses bilaterally PULMONARY: There is good air exchange bilaterally without wheezing or  rales. ABDOMEN: Soft and non-tender. MUSCULOSKELETAL: There are no major deformities  or cyanosis. NEUROLOGIC: No focal weakness or paresthesias are detected. SKIN: There are no ulcers or rashes noted. PSYCHIATRIC: The patient has a normal affect.  DATA:   Ultrasound today shows no growth in the aneurysm with limited visibility due to bowel gas.  I did review CTA from 08/12/2019 and confirmed the stent is well-positioned with no endoleak and I measure his aneurysm at 3.6 cm on axial and coronal views.  Assessment/Plan:  64 year old male presents for 1 year follow-up for surveillance of abdominal aortic aneurysm after endovascular stent repair in 2013 with Dr. Kellie Simmering. Ultrasound today confirmed no growth in the aneurysm and appears stable. I think the ultrasound has been unreliable given overlying bowel gas and has made accurate measurements difficult. We got a CT last February and it appears another CTA was obtained in September 2020 and on both of these CT scans I measure his aneurysm at 3.6 cm with a well-positioned stent graft and no endoleak. Discussed from an aneurysm standpoint I'll have him follow-up again in 1 year with another AAA duplex.  His wife remains concerned about his bloating and dark stools and other abdominal symptoms that are somewhat vague. She mentioned the findings on his CT from 08/12/2019 with possible inflammation around the celiac artery. I reviewed his CT scan and the celiac artery is patent as well as all its branch vessels with no apparent stenosis. There is no aneurysm. There would be no role for any surgical intervention here. I advised with his vague symptoms to go back to gastroenterology for further work-up and we would be happy to assist if gastroenterology has any concerns from our standpoint.   Marty Heck, MD [facility] Office: [phone]

## 2020-02-22 ENCOUNTER — Other Ambulatory Visit: Payer: Self-pay | Admitting: *Deleted

## 2020-02-22 DIAGNOSIS — Z95828 Presence of other vascular implants and grafts: Secondary | ICD-10-CM

## 2020-02-24 ENCOUNTER — Other Ambulatory Visit: Payer: Self-pay | Admitting: Nurse Practitioner

## 2020-02-24 DIAGNOSIS — I10 Essential (primary) hypertension: Secondary | ICD-10-CM

## 2020-03-19 ENCOUNTER — Ambulatory Visit: Payer: 59 | Admitting: Nurse Practitioner

## 2020-03-19 ENCOUNTER — Telehealth: Payer: Self-pay

## 2020-03-19 NOTE — Telephone Encounter (Signed)
Patient called to reschedule his appointment with Sherrie Mustache NP as he is out of town and he said he sent a [facility] message yesterday to cancel his appointment for today, appointment made 03/20/2020 for virtual visit per patients request

## 2020-03-20 ENCOUNTER — Encounter: Payer: Self-pay | Admitting: Nurse Practitioner

## 2020-03-20 ENCOUNTER — Telehealth (INDEPENDENT_AMBULATORY_CARE_PROVIDER_SITE_OTHER): Payer: 59 | Admitting: Nurse Practitioner

## 2020-03-20 ENCOUNTER — Other Ambulatory Visit: Payer: Self-pay

## 2020-03-20 DIAGNOSIS — I714 Abdominal aortic aneurysm, without rupture, unspecified: Secondary | ICD-10-CM

## 2020-03-20 DIAGNOSIS — I10 Essential (primary) hypertension: Secondary | ICD-10-CM | POA: Diagnosis not present

## 2020-03-20 DIAGNOSIS — F419 Anxiety disorder, unspecified: Secondary | ICD-10-CM | POA: Diagnosis not present

## 2020-03-20 DIAGNOSIS — E782 Mixed hyperlipidemia: Secondary | ICD-10-CM

## 2020-03-20 DIAGNOSIS — Z125 Encounter for screening for malignant neoplasm of prostate: Secondary | ICD-10-CM

## 2020-03-20 DIAGNOSIS — J4520 Mild intermittent asthma, uncomplicated: Secondary | ICD-10-CM

## 2020-03-20 DIAGNOSIS — F17200 Nicotine dependence, unspecified, uncomplicated: Secondary | ICD-10-CM

## 2020-03-20 MED ORDER — ALPRAZOLAM 0.25 MG PO TABS: 0.2500 mg | ORAL_TABLET | Freq: Every day | ORAL | 0 refills | Status: DC | PRN

## 2020-03-20 NOTE — Progress Notes (Signed)
This service is provided via telemedicine  No vital signs collected/recorded due to the encounter was a telemedicine visit.   Location of patient (ex: home, work): Home.  Patient consents to a telephone visit: Yes  Location of the provider (ex: office, home):  [facility].   Name of any referring provider: N/A  Names of all persons participating in the telemedicine service and their role in the encounter:  Patient, Aaron [name], RMA, Sherrie Mustache, NP.    Time spent on call:  8 minutes spent on the phone with Medical Assistant.       Careteam: [name], NP as PCP - General (Nurse Practitioner) [name], [name], NP as Nurse Practitioner (Vascular Surgery) [name], MD as Consulting Physician (General Surgery) Brand Males, MD as Consulting Physician (Pulmonary Disease) [name], MD as Consulting Physician (Cardiology) [name], PA-C as Physician Assistant (Dermatology)   Advanced Directive information Does Patient Have a Medical Advance Directive?: No  No Known Allergies  Chief Complaint  Patient presents with  . Medical Management of Chronic Issues    6 Month Follow Up/ Renew Contract.  . Immunizations    Discuss the need for Covid Vaccine.      HPI: Patient is a 64 y.o. male for routine follow up via virtual visit failed so telephone visit was done.  Put in his 2 week notice at work and planning on taking some time off to close his uncle estate.  His mother-in-law passed away. He is helping clean this out too. Wife works from home and they are staying at [employer] for a little while. [place].   GERD- controlled on protonix, occasionally will have issues.   Asthma- controlled on breo uses albuterol rarely.   HTN- has not checked recently, generally well controlled. Does not use table salt. Low sodium diet.   Anxiety- rarely uses xanax, uses daily if needed.    Hyperlipidemia- continues on simbastatin 20 mg daily, without myalgias   Smoker- continues to smoke.   AAA- continues to follow up with vascular routinely    Review of Systems:  Review of Systems  Constitutional: Negative for chills, fever and weight loss.  HENT: Negative for tinnitus.   Respiratory: Negative for cough, sputum production and shortness of breath.   Cardiovascular: Negative for chest pain, palpitations and leg swelling.  Gastrointestinal: Negative for abdominal pain, constipation, diarrhea and heartburn.  Genitourinary: Negative for dysuria, frequency and urgency.  Musculoskeletal: Negative for back pain, falls, joint pain and myalgias.  Skin: Negative.   Neurological: Negative for dizziness, weakness and headaches.  Psychiatric/Behavioral: Negative for depression and memory loss. The patient is nervous/anxious (occasional). The patient does not have insomnia.    Past Medical History:  Diagnosis Date  . Abdominal aortic aneurysm [facility])    sees Dr. Einar Gip for cardiac f/u, [phone]  . Arthritis    lower back  . Asthma    followed by Dr. Berdie Ogren  . Chronic airway obstruction, not elsewhere classified   . Dyspnea    normal PFT April 2012  . External hemorrhoids without mention of complication   . GERD (gastroesophageal reflux disease)   . Gout   . H/O hiatal hernia   . Hemorrhoids   . HTN (hypertension)    hx of sees Dr. Graylin Shiver  . Hyperlipidemia   . Insomnia, unspecified   . Other abnormal blood chemistry   . Other dyspnea and respiratory abnormality   .  Other malaise and fatigue   . Other specified erythematous condition(695.89)   . Other testicular dysfunction   . Prostatitis, unspecified   . Rash 2011   left chest, biopsy 2012 Dr. Rozann Lesches, results pending  . Skin cancer   . Tobacco abuse    Past Surgical History:  Procedure Laterality Date  . ABDOMINAL AORTIC ENDOVASCULAR STENT GRAFT  09/22/2012   Procedure: ABDOMINAL AORTIC  ENDOVASCULAR STENT GRAFT;  Surgeon: Mal Misty, MD;  Location: [place];  Service: Vascular;  Laterality: N/A;  GORE; ultrasound guided.  . APPENDECTOMY    . EVALUATION UNDER ANESTHESIA WITH HEMORRHOIDECTOMY N/A 12/31/2016   Procedure: EXAM UNDER ANESTHESIA WITH HEMORRHOIDECTOMY;  Surgeon: [name], MD;  Location: [place];  Service: General;  Laterality: N/A;  . SKIN CANCER EXCISION    . TOE SURGERY  01/2012   joint of left great toe  . TONSILLECTOMY    . TONSILLECTOMY    . TYMPANOPLASTY    . WRIST SURGERY     left   Social History:   reports that he has been smoking cigarettes. He has a 9.50 pack-year smoking history. He has quit using smokeless tobacco.  His smokeless tobacco use included snuff. He reports current alcohol use. He reports that he does not use drugs.  Family History  Problem Relation Age of Onset  . Heart attack Father 5  . COPD Father   . Heart disease Father   . Asthma Daughter   . Cancer Mother        Breast  . Lupus Daughter   . Diabetes Other        Grandson   . Colon cancer Neg Hx   . Pancreatic cancer Neg Hx   . Esophageal cancer Neg Hx   . Stomach cancer Neg Hx   . Rectal cancer Neg Hx     Medications: Patient's Medications  New Prescriptions   No medications on file  Previous Medications   ALBUTEROL (PROVENTIL HFA;VENTOLIN HFA) 108 (90 BASE) MCG/ACT INHALER    Inhale 2 puffs into the lungs every 6 (six) hours as needed. For cough and wheezing.   ALPRAZOLAM (XANAX) 0.25 MG TABLET    TAKE 1 TABLET (0.25 MG TOTAL) BY MOUTH 2 (TWO) TIMES DAILY AS NEEDED FOR ANXIETY.   ASPIRIN EC 81 MG TABLET    Take 81 mg by mouth daily.   BREO ELLIPTA 200-25 MCG/INH AEPB    INHALE 1 PUFF BY MOUTH EVERY DAY   LOSARTAN (COZAAR) 50 MG TABLET    TAKE 1 TABLET BY MOUTH ONCE DAILY TO CONTROL BLOOD PRESSURE   PANTOPRAZOLE (PROTONIX) 40 MG TABLET    TAKE 1 TABLET BY MOUTH EVERY DAY   SILDENAFIL (VIAGRA) 50 MG TABLET    TAKE 1 TABLET BY MOUTH AS  NEEDED FOR ERECTILE DYSFUNCTION (DX N52.9).   SIMVASTATIN (ZOCOR) 20 MG TABLET    TAKE 1 TABLET BY MOUTH EVERYDAY AT BEDTIME  Modified Medications   No medications on file  Discontinued Medications   No medications on file    Physical Exam:  There were no vitals filed for this visit. There is no height or weight on file to calculate BMI. Wt Readings from Last 3 Encounters:  02/21/20 227 lb (103 kg)  10/04/19 235 lb (106.6 kg)  09/27/19 235 lb (106.6 kg)      Labs reviewed: Basic Metabolic Panel: Recent Labs    08/12/19 2011 09/19/19 0907  NA 140 143  K 3.7 4.4  CL 102 107  CO2 27 26  GLUCOSE 93 101*  BUN 7 11  CREATININE 0.89 0.99  CALCIUM 9.2 9.2   Liver Function Tests: Recent Labs    08/12/19 2011 09/19/19 0907  AST 26 31  ALT 34 32  ALKPHOS 79  --   BILITOT 0.8 0.6  PROT 7.2 6.7  ALBUMIN 4.2  --    Recent Labs    08/12/19 2011  LIPASE 22   No results for input(s): AMMONIA in the last 8760 hours. CBC: Recent Labs    08/12/19 2011 09/19/19 0907  WBC 9.3 6.4  NEUTROABS  --  4,352  HGB 15.7 15.3  HCT 47.6 45.9  MCV 91.2 93.1  PLT 239 270   Lipid Panel: Recent Labs    09/19/19 0907  CHOL 138  HDL 45  LDLCALC 77  TRIG 75  CHOLHDL 3.1   TSH: No results for input(s): TSH in the last 8760 hours. A1C: Lab Results  Component Value Date   HGBA1C 6.0 (H) 08/09/2015     Assessment/Plan 1. Essential hypertension -controlled when he takes blood pressure at home. Did not take today. Continue on low sodium diet with losartan 50 mg daily.  2. Mixed hyperlipidemia - LDL 77 on zocor, will continue current regimen and encouraged pt to continue lifestyle modifications.   3. Prostate cancer screening -PSA, future  4. Anxiety -doing well, uses xanax PRN for increase panic  5. Mild intermittent asthma without complication Stable on breo, rarely need albuterol. Continues to smoke, cessation strongly encouraged.  6. AAA (abdominal aortic  aneurysm) without rupture (HCC) Stable, continues with vascular follow up. Encouraged smoking cessation.  7. Smoking Cessation encouraged.   Next appt: 6 months for CPE with fasting labs prior.  Carlos American. Harle Battiest  [facility] [phone]    Virtual Visit via Telephone Note  I connected with pt on 03/20/20 at  8:30 AM EDT by telephone and verified that I am speaking with the correct person using two identifiers.  Location: Patient: home Provider: [facility] [place] [facility].    I discussed the limitations, risks, security and privacy concerns of performing an evaluation and management service by telephone and the availability of in person appointments. I also discussed with the patient that there may be a patient responsible charge related to this service. The patient expressed understanding and agreed to proceed.   I discussed the assessment and treatment plan with the patient. The patient was provided an opportunity to ask questions and all were answered. The patient agreed with the plan and demonstrated an understanding of the instructions.   The patient was advised to call back or seek an in-person evaluation if the symptoms worsen or if the condition fails to improve as anticipated.  I provided 18 minutes of non-face-to-face time during this encounter.  Carlos American. Harle Battiest Avs printed and mailed

## 2020-03-28 ENCOUNTER — Other Ambulatory Visit: Payer: Self-pay | Admitting: Nurse Practitioner

## 2020-03-28 DIAGNOSIS — J4540 Moderate persistent asthma, uncomplicated: Secondary | ICD-10-CM

## 2020-03-28 DIAGNOSIS — N529 Male erectile dysfunction, unspecified: Secondary | ICD-10-CM

## 2020-04-29 ENCOUNTER — Other Ambulatory Visit: Payer: Self-pay | Admitting: Nurse Practitioner

## 2020-04-29 DIAGNOSIS — F419 Anxiety disorder, unspecified: Secondary | ICD-10-CM

## 2020-05-15 DIAGNOSIS — B001 Herpesviral vesicular dermatitis: Secondary | ICD-10-CM | POA: Diagnosis not present

## 2020-05-26 DIAGNOSIS — R0602 Shortness of breath: Secondary | ICD-10-CM | POA: Diagnosis not present

## 2020-05-26 DIAGNOSIS — H6123 Impacted cerumen, bilateral: Secondary | ICD-10-CM | POA: Diagnosis not present

## 2020-05-26 DIAGNOSIS — R05 Cough: Secondary | ICD-10-CM | POA: Diagnosis not present

## 2020-05-28 DIAGNOSIS — R05 Cough: Secondary | ICD-10-CM | POA: Diagnosis not present

## 2020-05-28 DIAGNOSIS — R079 Chest pain, unspecified: Secondary | ICD-10-CM | POA: Diagnosis not present

## 2020-05-28 DIAGNOSIS — I10 Essential (primary) hypertension: Secondary | ICD-10-CM | POA: Diagnosis not present

## 2020-05-28 DIAGNOSIS — J45909 Unspecified asthma, uncomplicated: Secondary | ICD-10-CM | POA: Diagnosis not present

## 2020-05-28 DIAGNOSIS — Z7951 Long term (current) use of inhaled steroids: Secondary | ICD-10-CM | POA: Diagnosis not present

## 2020-05-28 DIAGNOSIS — R0602 Shortness of breath: Secondary | ICD-10-CM | POA: Diagnosis not present

## 2020-05-28 DIAGNOSIS — J40 Bronchitis, not specified as acute or chronic: Secondary | ICD-10-CM | POA: Diagnosis not present

## 2020-05-28 DIAGNOSIS — F17200 Nicotine dependence, unspecified, uncomplicated: Secondary | ICD-10-CM | POA: Diagnosis not present

## 2020-06-04 ENCOUNTER — Other Ambulatory Visit: Payer: Self-pay | Admitting: Nurse Practitioner

## 2020-06-04 DIAGNOSIS — I10 Essential (primary) hypertension: Secondary | ICD-10-CM

## 2020-06-04 MED ORDER — LOSARTAN POTASSIUM 50 MG PO TABS: ORAL_TABLET | ORAL | 1 refills | Status: DC

## 2020-06-04 NOTE — Telephone Encounter (Signed)
Patient called to request a refill of his Losartan 50 mg because he switched Pharmacies and the [place] where he was going said they would not send his refill information because he had gotten his refill from a different pharmacy and that we would have to call or send a new script to new pharmacy because they had no idea of how many refills remained I called the [facility] to  verify and they did say we would have to send a new script to new pharmacy I sent to [facility] for patient

## 2020-06-13 ENCOUNTER — Ambulatory Visit (INDEPENDENT_AMBULATORY_CARE_PROVIDER_SITE_OTHER): Payer: 59 | Admitting: Nurse Practitioner

## 2020-06-13 ENCOUNTER — Encounter: Payer: Self-pay | Admitting: Nurse Practitioner

## 2020-06-13 ENCOUNTER — Other Ambulatory Visit: Payer: Self-pay

## 2020-06-13 ENCOUNTER — Other Ambulatory Visit (HOSPITAL_COMMUNITY): Payer: Self-pay | Admitting: Internal Medicine

## 2020-06-13 VITALS — BP 132/80 | HR 94 | Temp 97.1°F | Ht 74.0 in | Wt 231.2 lb

## 2020-06-13 DIAGNOSIS — G519 Disorder of facial nerve, unspecified: Secondary | ICD-10-CM | POA: Diagnosis not present

## 2020-06-13 DIAGNOSIS — F419 Anxiety disorder, unspecified: Secondary | ICD-10-CM | POA: Diagnosis not present

## 2020-06-13 DIAGNOSIS — B029 Zoster without complications: Secondary | ICD-10-CM

## 2020-06-13 LAB — COMPLETE METABOLIC PANEL WITH GFR
AG Ratio: 1.5 (calc) (ref 1.0–2.5)
ALT: 38 U/L (ref 9–46)
AST: 27 U/L (ref 10–35)
Albumin: 4 g/dL (ref 3.6–5.1)
Alkaline phosphatase (APISO): 79 U/L (ref 35–144)
BUN: 9 mg/dL (ref 7–25)
CO2: 27 mmol/L (ref 20–32)
Calcium: 9.4 mg/dL (ref 8.6–10.3)
Chloride: 105 mmol/L (ref 98–110)
Creat: 0.96 mg/dL (ref 0.70–1.25)
GFR, Est African American: 98 mL/min/{1.73_m2} (ref >=60)
GFR, Est Non African American: 85 mL/min/{1.73_m2} (ref >=60)
Globulin: 2.7 g/dL (calc) (ref 1.9–3.7)
Glucose, Bld: 95 mg/dL (ref 65–139)
Potassium: 4.2 mmol/L (ref 3.5–5.3)
Sodium: 141 mmol/L (ref 135–146)
Total Bilirubin: 0.5 mg/dL (ref 0.2–1.2)
Total Protein: 6.7 g/dL (ref 6.1–8.1)

## 2020-06-13 LAB — SEDIMENTATION RATE: Sed Rate: 11 mm/h (ref 0–20)

## 2020-06-13 LAB — CBC WITH DIFFERENTIAL/PLATELET
Absolute Monocytes: 672 cells/uL (ref 200–950)
Basophils Absolute: 73 cells/uL (ref 0–200)
Basophils Relative: 1 %
Eosinophils Absolute: 131 cells/uL (ref 15–500)
Eosinophils Relative: 1.8 %
HCT: 45 % (ref 38.5–50.0)
Hemoglobin: 15.1 g/dL (ref 13.2–17.1)
Lymphs Abs: 1964 cells/uL (ref 850–3900)
MCH: 31.9 pg (ref 27.0–33.0)
MCHC: 33.6 g/dL (ref 32.0–36.0)
MCV: 95.1 fL (ref 80.0–100.0)
MPV: 9.3 fL (ref 7.5–12.5)
Monocytes Relative: 9.2 %
Neutro Abs: 4460 cells/uL (ref 1500–7800)
Neutrophils Relative %: 61.1 %
Platelets: 260 10*3/uL (ref 140–400)
RBC: 4.73 10*6/uL (ref 4.20–5.80)
RDW: 12.2 % (ref 11.0–15.0)
Total Lymphocyte: 26.9 %
WBC: 7.3 10*3/uL (ref 3.8–10.8)

## 2020-06-13 MED ORDER — ALPRAZOLAM 0.25 MG PO TABS: 0.2500 mg | ORAL_TABLET | Freq: Every day | ORAL | 0 refills | Status: DC | PRN

## 2020-06-13 NOTE — Progress Notes (Signed)
Careteam: Patient Care Team: Aaron Chandler, NP as PCP - General (Nurse Practitioner) [name], [name], NP (Inactive) as Nurse Practitioner (Vascular Surgery) [name], MD as Consulting Physician (General Surgery) Brand Males, MD as Consulting Physician (Pulmonary Disease) [name], MD as Consulting Physician (Cardiology) Starlyn Skeans as Physician Assistant (Dermatology)  PLACE OF SERVICE:  [place] Directive information    No Known Allergies  Chief Complaint  Patient presents with   Acute Visit    Tingling in the face since May 15, 2020, patient questions if related to taking 1000 mcg daily of b12. [facility] [place] [facility] [place] [facility] [place]FYI). May 26, 2020 [facility]. On July 5,2021 [facility] and told to go to ER for breathing issues.    Medication Refill    Renew Xanax- [facility] outpatient pharmacy      HPI: Patient is a 64 y.o. male due to tingling in the face.  He got shingles in June and his wife started him on B12. Since then he has tingling off and on his face. (~1 month) reports tingling comes and goes throughout the day 4 times yesterday, twice daily. Reports tingling last a few minutes then goes away on its on.  He went to the dentist yesterday, thought it could be neuropathy.  No trouble swallowing No blurred vision No hearing loss Had a hard time sleeping last night. Got online and read b12 could cause symptoms. No numbness or tingling to right arm or leg.  No slurred speech or blurred vision. Had shingles on right side of buttocks. Has healed, no pain.   Review of Systems:  Review of Systems  Constitutional: Negative for chills, fever and malaise/fatigue.  HENT: Negative for congestion, ear discharge, ear pain, hearing loss and tinnitus.   Eyes: Negative for blurred vision.  Psychiatric/Behavioral: The patient is  nervous/anxious.     Past Medical History:  Diagnosis Date   Abdominal aortic aneurysm [facility])    sees Dr. Einar Gip for cardiac f/u, [phone]   Arthritis    lower back   Asthma    followed by Dr. Berdie Ogren   Chronic airway obstruction, not elsewhere classified    Dyspnea    normal PFT April 2012   External hemorrhoids without mention of complication    GERD (gastroesophageal reflux disease)    Gout    H/O hiatal hernia    Hemorrhoids    HTN (hypertension)    hx of sees Dr. Graylin Shiver   Hyperlipidemia    Insomnia, unspecified    Other abnormal blood chemistry    Other dyspnea and respiratory abnormality    Other malaise and fatigue    Other specified erythematous condition(695.89)    Other testicular dysfunction    Prostatitis, unspecified    Rash 2011   left chest, biopsy 2012 Dr. Rozann Lesches, results pending   Skin cancer    Tobacco abuse    Past Surgical History:  Procedure Laterality Date   ABDOMINAL AORTIC ENDOVASCULAR STENT GRAFT  09/22/2012   Procedure: ABDOMINAL AORTIC ENDOVASCULAR STENT GRAFT;  Surgeon: Mal Misty, MD;  Location: [place];  Service: Vascular;  Laterality: N/A;  GORE; ultrasound guided.   APPENDECTOMY     EVALUATION UNDER ANESTHESIA WITH HEMORRHOIDECTOMY N/A 12/31/2016   Procedure: EXAM UNDER ANESTHESIA WITH HEMORRHOIDECTOMY;  Surgeon: [name], MD;  Location: [place];  Service: General;  Laterality: N/A;   SKIN CANCER EXCISION     TOE SURGERY  01/2012   joint of left great toe   TONSILLECTOMY     TONSILLECTOMY     TYMPANOPLASTY     WRIST SURGERY     left   Social History:   reports that he has been smoking cigarettes. He has a 9.50 pack-year smoking history. He has quit using smokeless tobacco.  His smokeless tobacco use included snuff. He reports current alcohol use. He reports that he does not use drugs.  Family History  Problem Relation Age of Onset   Heart attack Father 45    COPD Father    Heart disease Father    Asthma Daughter    Cancer Mother        Breast   Lupus Daughter    Diabetes Other        Grandson    Colon cancer Neg Hx    Pancreatic cancer Neg Hx    Esophageal cancer Neg Hx    Stomach cancer Neg Hx    Rectal cancer Neg Hx     Medications: Patient's Medications  New Prescriptions   No medications on file  Previous Medications   ALBUTEROL (PROVENTIL HFA;VENTOLIN HFA) 108 (90 BASE) MCG/ACT INHALER    Inhale 2 puffs into the lungs every 6 (six) hours as needed. For cough and wheezing.   ALPRAZOLAM (XANAX) 0.25 MG TABLET    Take 1 tablet (0.25 mg total) by mouth daily as needed for anxiety.   ASPIRIN EC 81 MG TABLET    Take 81 mg by mouth daily.   BREO ELLIPTA 200-25 MCG/INH AEPB    INHALE 1 PUFF BY MOUTH EVERY DAY   LOSARTAN (COZAAR) 50 MG TABLET    TAKE 1 TABLET BY MOUTH ONCE DAILY TO CONTROL BLOOD PRESSURE DX I10   PANTOPRAZOLE (PROTONIX) 40 MG TABLET    TAKE 1 TABLET BY MOUTH EVERY DAY   SILDENAFIL (VIAGRA) 50 MG TABLET    TAKE 1 TABLET BY MOUTH AS NEEDED FOR ERECTILE DYSFUNCTION (DX N52.9).   SIMVASTATIN (ZOCOR) 20 MG TABLET    TAKE 1 TABLET BY MOUTH EVERYDAY AT BEDTIME  Modified Medications   No medications on file  Discontinued Medications   No medications on file    Physical Exam:  Vitals:   06/13/20 1132  BP: 132/80  Pulse: 94  Temp: (!) 97.1 F (36.2 C)  TempSrc: Temporal  SpO2: 95%  Weight: 231 lb 3.2 oz (104.9 kg)  Height: 6\' 2"  (1.88 m)   Body mass index is 29.68 kg/m. Wt Readings from Last 3 Encounters:  06/13/20 231 lb 3.2 oz (104.9 kg)  02/21/20 227 lb (103 kg)  10/04/19 235 lb (106.6 kg)    Physical Exam Constitutional:      General: He is not in acute distress.    Appearance: He is well-developed. He is not diaphoretic.  HENT:     Head: Normocephalic and atraumatic.     Mouth/Throat:     Pharynx: No oropharyngeal exudate.  Eyes:     Conjunctiva/sclera: Conjunctivae normal.     Pupils:  Pupils are equal, round, and reactive to light.  Cardiovascular:     Rate and Rhythm: Normal rate and regular rhythm.     Heart sounds: Normal heart sounds.  Pulmonary:     Effort: Pulmonary effort is normal.     Breath sounds: Normal breath sounds.  Abdominal:     General: Bowel sounds are normal.  Palpations: Abdomen is soft.  Musculoskeletal:        General: No tenderness.     Cervical back: Normal range of motion and neck supple.  Skin:    General: Skin is warm and dry.     Findings: Lesion (healed lesions to right buttock) present.  Neurological:     General: No focal deficit present.     Mental Status: He is alert and oriented to person, place, and time. Mental status is at baseline.     Cranial Nerves: No cranial nerve deficit.     Sensory: No sensory deficit.     Motor: No weakness.     Coordination: Coordination normal.     Gait: Gait normal.     Deep Tendon Reflexes: Reflexes normal.  Psychiatric:        Mood and Affect: Mood normal.        Behavior: Behavior normal.     Labs reviewed: Basic Metabolic Panel: Recent Labs    08/12/19 2011 09/19/19 0907  NA 140 143  K 3.7 4.4  CL 102 107  CO2 27 26  GLUCOSE 93 101*  BUN 7 11  CREATININE 0.89 0.99  CALCIUM 9.2 9.2   Liver Function Tests: Recent Labs    08/12/19 2011 09/19/19 0907  AST 26 31  ALT 34 32  ALKPHOS 79  --   BILITOT 0.8 0.6  PROT 7.2 6.7  ALBUMIN 4.2  --    Recent Labs    08/12/19 2011  LIPASE 22   No results for input(s): AMMONIA in the last 8760 hours. CBC: Recent Labs    08/12/19 2011 09/19/19 0907  WBC 9.3 6.4  NEUTROABS  --  4,352  HGB 15.7 15.3  HCT 47.6 45.9  MCV 91.2 93.1  PLT 239 270   Lipid Panel: Recent Labs    09/19/19 0907  CHOL 138  HDL 45  LDLCALC 77  TRIG 75  CHOLHDL 3.1   TSH: No results for input(s): TSH in the last 8760 hours. A1C: Lab Results  Component Value Date   HGBA1C 6.0 (H) 08/09/2015     Assessment/Plan 1. Facial  neuropathy -has stopped b12, neuro exam and cranial nerves WNL. He will notify us if symptoms worsen or fail to improve - CBC with Differential/Platelet - COMPLETE METABOLIC PANEL WITH GFR - Sedimentation Rate  2. Anxiety - ALPRAZolam (XANAX) 0.25 MG tablet; Take 1 tablet (0.25 mg total) by mouth daily as needed for anxiety.  Dispense: 20 tablet; Refill: 0  3. Zoster -lesions have healed at this time. No ongoing neuropathy or post-herpetic neuralgia noted. To area affected   Next appt: as scheduled, sooner if needed Aaron [name] K. [place], [facility] [phone]

## 2020-06-19 ENCOUNTER — Other Ambulatory Visit: Payer: Self-pay

## 2020-06-19 DIAGNOSIS — E782 Mixed hyperlipidemia: Secondary | ICD-10-CM

## 2020-06-19 DIAGNOSIS — K219 Gastro-esophageal reflux disease without esophagitis: Secondary | ICD-10-CM

## 2020-06-19 MED ORDER — PANTOPRAZOLE SODIUM 40 MG PO TBEC: 40.0000 mg | DELAYED_RELEASE_TABLET | Freq: Every day | ORAL | 1 refills | Status: DC

## 2020-06-20 ENCOUNTER — Ambulatory Visit: Payer: 59 | Admitting: Nurse Practitioner

## 2020-06-21 ENCOUNTER — Telehealth: Payer: Self-pay | Admitting: *Deleted

## 2020-06-21 DIAGNOSIS — E782 Mixed hyperlipidemia: Secondary | ICD-10-CM

## 2020-06-21 MED ORDER — SIMVASTATIN 20 MG PO TABS: ORAL_TABLET | ORAL | 1 refills | Status: DC

## 2020-06-21 NOTE — Telephone Encounter (Signed)
Noted thank you for the update

## 2020-06-21 NOTE — Telephone Encounter (Signed)
Patient called and stated that he was seen last week and had Face tingling.  Stated that that is better. Stated that he had one tingle yesterday behind the ear.  He just wanted to let you know.   Also needed his cholesterol medication faxed to [facility]. Faxed.

## 2020-06-29 ENCOUNTER — Other Ambulatory Visit: Payer: Self-pay | Admitting: Nurse Practitioner

## 2020-06-29 DIAGNOSIS — N529 Male erectile dysfunction, unspecified: Secondary | ICD-10-CM

## 2020-07-31 ENCOUNTER — Other Ambulatory Visit: Payer: Self-pay | Admitting: Nurse Practitioner

## 2020-07-31 DIAGNOSIS — N529 Male erectile dysfunction, unspecified: Secondary | ICD-10-CM

## 2020-08-04 DIAGNOSIS — Z23 Encounter for immunization: Secondary | ICD-10-CM | POA: Diagnosis not present

## 2020-08-24 ENCOUNTER — Other Ambulatory Visit: Payer: Self-pay | Admitting: Nurse Practitioner

## 2020-08-24 DIAGNOSIS — F419 Anxiety disorder, unspecified: Secondary | ICD-10-CM

## 2020-08-24 NOTE — Telephone Encounter (Signed)
Pharmacy requested refill Epic LR: 06/13/20 No Contract on File. Added note to upcoming appointment Pended Rx and sent to [facility] for approval.

## 2020-08-26 ENCOUNTER — Other Ambulatory Visit: Payer: Self-pay | Admitting: Nurse Practitioner

## 2020-08-26 DIAGNOSIS — K219 Gastro-esophageal reflux disease without esophagitis: Secondary | ICD-10-CM

## 2020-09-17 ENCOUNTER — Other Ambulatory Visit: Payer: 59

## 2020-09-18 ENCOUNTER — Other Ambulatory Visit: Payer: Self-pay

## 2020-09-18 ENCOUNTER — Other Ambulatory Visit: Payer: 59

## 2020-09-18 DIAGNOSIS — Z125 Encounter for screening for malignant neoplasm of prostate: Secondary | ICD-10-CM

## 2020-09-18 DIAGNOSIS — E782 Mixed hyperlipidemia: Secondary | ICD-10-CM

## 2020-09-18 DIAGNOSIS — R739 Hyperglycemia, unspecified: Secondary | ICD-10-CM | POA: Diagnosis not present

## 2020-09-18 DIAGNOSIS — I10 Essential (primary) hypertension: Secondary | ICD-10-CM | POA: Diagnosis not present

## 2020-09-20 LAB — CBC WITH DIFFERENTIAL/PLATELET
Absolute Monocytes: 689 cells/uL (ref 200–950)
Basophils Absolute: 78 cells/uL (ref 0–200)
Basophils Relative: 1.2 %
Eosinophils Absolute: 130 cells/uL (ref 15–500)
Eosinophils Relative: 2 %
HCT: 45.1 % (ref 38.5–50.0)
Hemoglobin: 15.6 g/dL (ref 13.2–17.1)
Lymphs Abs: 1684 cells/uL (ref 850–3900)
MCH: 32.5 pg (ref 27.0–33.0)
MCHC: 34.6 g/dL (ref 32.0–36.0)
MCV: 94 fL (ref 80.0–100.0)
MPV: 9.5 fL (ref 7.5–12.5)
Monocytes Relative: 10.6 %
Neutro Abs: 3920 cells/uL (ref 1500–7800)
Neutrophils Relative %: 60.3 %
Platelets: 285 10*3/uL (ref 140–400)
RBC: 4.8 10*6/uL (ref 4.20–5.80)
RDW: 11.6 % (ref 11.0–15.0)
Total Lymphocyte: 25.9 %
WBC: 6.5 10*3/uL (ref 3.8–10.8)

## 2020-09-20 LAB — COMPLETE METABOLIC PANEL WITH GFR
AG Ratio: 1.7 (calc) (ref 1.0–2.5)
ALT: 28 U/L (ref 9–46)
AST: 25 U/L (ref 10–35)
Albumin: 4.3 g/dL (ref 3.6–5.1)
Alkaline phosphatase (APISO): 71 U/L (ref 35–144)
BUN: 10 mg/dL (ref 7–25)
CO2: 25 mmol/L (ref 20–32)
Calcium: 9.8 mg/dL (ref 8.6–10.3)
Chloride: 105 mmol/L (ref 98–110)
Creat: 0.99 mg/dL (ref 0.70–1.25)
GFR, Est African American: 94 mL/min/{1.73_m2} (ref >=60)
GFR, Est Non African American: 81 mL/min/{1.73_m2} (ref >=60)
Globulin: 2.6 g/dL (calc) (ref 1.9–3.7)
Glucose, Bld: 109 mg/dL — ABNORMAL HIGH (ref 65–99)
Potassium: 4 mmol/L (ref 3.5–5.3)
Sodium: 140 mmol/L (ref 135–146)
Total Bilirubin: 0.7 mg/dL (ref 0.2–1.2)
Total Protein: 6.9 g/dL (ref 6.1–8.1)

## 2020-09-20 LAB — LIPID PANEL
Cholesterol: 163 mg/dL (ref <200)
HDL: 47 mg/dL (ref >=40)
LDL Cholesterol (Calc): 95 mg/dL (calc)
Non-HDL Cholesterol (Calc): 116 mg/dL (calc) (ref <130)
Total CHOL/HDL Ratio: 3.5 (calc) (ref <5.0)
Triglycerides: 116 mg/dL (ref <150)

## 2020-09-20 LAB — HEMOGLOBIN A1C
Hgb A1c MFr Bld: 5.4 % of total Hgb (ref <5.7)
Mean Plasma Glucose: 108 (calc)
eAG (mmol/L): 6 (calc)

## 2020-09-20 LAB — TEST AUTHORIZATION

## 2020-09-20 LAB — PSA: PSA: 0.35 ng/mL (ref <=4.0)

## 2020-09-21 ENCOUNTER — Other Ambulatory Visit (HOSPITAL_COMMUNITY): Payer: Self-pay | Admitting: Orthopedic Surgery

## 2020-09-21 ENCOUNTER — Encounter: Payer: Self-pay | Admitting: Nurse Practitioner

## 2020-09-21 ENCOUNTER — Other Ambulatory Visit: Payer: Self-pay

## 2020-09-21 ENCOUNTER — Ambulatory Visit (INDEPENDENT_AMBULATORY_CARE_PROVIDER_SITE_OTHER): Payer: 59 | Admitting: Nurse Practitioner

## 2020-09-21 VITALS — BP 132/88 | HR 61 | Temp 98.1°F | Resp 20 | Ht 74.0 in | Wt 238.0 lb

## 2020-09-21 DIAGNOSIS — E782 Mixed hyperlipidemia: Secondary | ICD-10-CM

## 2020-09-21 DIAGNOSIS — E6609 Other obesity due to excess calories: Secondary | ICD-10-CM

## 2020-09-21 DIAGNOSIS — I714 Abdominal aortic aneurysm, without rupture, unspecified: Secondary | ICD-10-CM

## 2020-09-21 DIAGNOSIS — J4540 Moderate persistent asthma, uncomplicated: Secondary | ICD-10-CM

## 2020-09-21 DIAGNOSIS — R911 Solitary pulmonary nodule: Secondary | ICD-10-CM | POA: Diagnosis not present

## 2020-09-21 DIAGNOSIS — I10 Essential (primary) hypertension: Secondary | ICD-10-CM | POA: Diagnosis not present

## 2020-09-21 DIAGNOSIS — M542 Cervicalgia: Secondary | ICD-10-CM | POA: Diagnosis not present

## 2020-09-21 DIAGNOSIS — Z6830 Body mass index (BMI) 30.0-30.9, adult: Secondary | ICD-10-CM

## 2020-09-21 DIAGNOSIS — Z23 Encounter for immunization: Secondary | ICD-10-CM | POA: Diagnosis not present

## 2020-09-21 DIAGNOSIS — F17200 Nicotine dependence, unspecified, uncomplicated: Secondary | ICD-10-CM

## 2020-09-21 DIAGNOSIS — F419 Anxiety disorder, unspecified: Secondary | ICD-10-CM

## 2020-09-21 MED ORDER — CHANTIX STARTING MONTH PAK 0.5 MG X 11 & 1 MG X 42 PO TABS: ORAL_TABLET | ORAL | 0 refills | Status: DC

## 2020-09-21 NOTE — Patient Instructions (Signed)
6 months, labs prior to appt

## 2020-09-21 NOTE — Progress Notes (Signed)
Careteam: [name], NP as PCP - General (Nurse Practitioner) [name], [name], NP (Inactive) as Nurse Practitioner (Vascular Surgery) [name], MD as Consulting Physician (General Surgery) Brand Males, MD as Consulting Physician (Pulmonary Disease) [name], MD as Consulting Physician (Cardiology) [name], PA-C as Physician Assistant (Dermatology)  PLACE OF SERVICE:  [place] Directive information Does Patient Have a Medical Advance Directive?: No  No Known Allergies  Chief Complaint  Patient presents with  . Medical Management of Chronic Issues    6 Month Follow Up, Dscuss Influenza Shot,  Discuss Labs, Sign Treatment Agreement     HPI: Patient is a 64 y.o. male for routine follow up.   Reports he stopped taking b12 and neuropathy stopped.   Reports he is ready to quit smoking. Would like rx for chantix, took this in the past and did well with that. Quit smoking in the past for 5 years.   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 Having stress and anxiety over that.   Reports he is taking his xanax at night. Does not use every night   Hyperlipidemia- continues on zocor   COPD- controlled on breo, not needing albuterol very often.    Review of Systems:  Review of Systems  Constitutional: Negative for chills, fever and weight loss.  HENT: Negative for tinnitus.   Respiratory: Negative for cough, sputum production and shortness of breath.   Cardiovascular: Negative for chest pain, palpitations and leg swelling.  Gastrointestinal: Negative for abdominal pain, constipation, diarrhea and heartburn.  Genitourinary: Negative for dysuria, frequency and urgency.  Musculoskeletal: Negative for back pain, falls, joint pain and myalgias.  Skin: Negative.   Neurological: Negative for dizziness and headaches.  Psychiatric/Behavioral: Negative for depression and memory loss. The patient is nervous/anxious. The  patient does not have insomnia.     Past Medical History:  Diagnosis Date  . Abdominal aortic aneurysm [facility])    sees Dr. Einar Gip for cardiac f/u, [phone]  . Arthritis    lower back  . Asthma    followed by Dr. Berdie Ogren  . Chronic airway obstruction, not elsewhere classified   . Dyspnea    normal PFT April 2012  . External hemorrhoids without mention of complication   . GERD (gastroesophageal reflux disease)   . Gout   . H/O hiatal hernia   . Hemorrhoids   . HTN (hypertension)    hx of sees Dr. Graylin Shiver  . Hyperlipidemia   . Insomnia, unspecified   . Other abnormal blood chemistry   . Other dyspnea and respiratory abnormality   . Other malaise and fatigue   . Other specified erythematous condition(695.89)   . Other testicular dysfunction   . Prostatitis, unspecified   . Rash 2011   left chest, biopsy 2012 Dr. Rozann Lesches, results pending  . Skin cancer   . Tobacco abuse    Past Surgical History:  Procedure Laterality Date  . ABDOMINAL AORTIC ENDOVASCULAR STENT GRAFT  09/22/2012   Procedure: ABDOMINAL AORTIC ENDOVASCULAR STENT GRAFT;  Surgeon: Mal Misty, MD;  Location: [place];  Service: Vascular;  Laterality: N/A;  GORE; ultrasound guided.  . APPENDECTOMY    . EVALUATION UNDER ANESTHESIA WITH HEMORRHOIDECTOMY N/A 12/31/2016   Procedure: EXAM UNDER ANESTHESIA WITH HEMORRHOIDECTOMY;  Surgeon: [name], MD;  Location: [place];  Service: General;  Laterality: N/A;  . SKIN CANCER EXCISION    . TOE SURGERY  01/2012   joint of  left great toe  . TONSILLECTOMY    . TONSILLECTOMY    . TYMPANOPLASTY    . WRIST SURGERY     left   Social History:   reports that he has been smoking cigarettes. He has a 9.50 pack-year smoking history. He has quit using smokeless tobacco.  His smokeless tobacco use included snuff. He reports current alcohol use. He reports that he does not use drugs.  Family History  Problem Relation Age of Onset  . Heart attack  Father 11  . COPD Father   . Heart disease Father   . Asthma Daughter   . Cancer Mother        Breast  . Lupus Daughter   . Diabetes Other        Grandson   . Colon cancer Neg Hx   . Pancreatic cancer Neg Hx   . Esophageal cancer Neg Hx   . Stomach cancer Neg Hx   . Rectal cancer Neg Hx     Medications: Patient's Medications  New Prescriptions   No medications on file  Previous Medications   ALBUTEROL (PROVENTIL HFA;VENTOLIN HFA) 108 (90 BASE) MCG/ACT INHALER    Inhale 2 puffs into the lungs every 6 (six) hours as needed. For cough and wheezing.   ALPRAZOLAM (XANAX) 0.25 MG TABLET    TAKE 1 TABLET BY MOUTH ONCE A DAY AS NEEDED FOR ANXIETY   ASPIRIN EC 81 MG TABLET    Take 81 mg by mouth daily.   BREO ELLIPTA 200-25 MCG/INH AEPB    INHALE 1 PUFF BY MOUTH EVERY DAY   LOSARTAN (COZAAR) 50 MG TABLET    TAKE 1 TABLET BY MOUTH ONCE DAILY TO CONTROL BLOOD PRESSURE DX I10   PANTOPRAZOLE (PROTONIX) 40 MG TABLET    Take 1 tablet (40 mg total) by mouth daily.   SILDENAFIL (VIAGRA) 50 MG TABLET    TAKE 1 TABLET BY MOUTH AS NEEDED FOR ERECTILE DYSFUNCTION.   SIMVASTATIN (ZOCOR) 20 MG TABLET    Take one tablet by mouth once daily at bedtime.  Modified Medications   No medications on file  Discontinued Medications   No medications on file    Physical Exam:  Vitals:   09/21/20 1412 09/21/20 1451  BP: (!) 150/98 132/88  Pulse: 61   Resp: 20   Temp: 98.1 F (36.7 C)   TempSrc: Temporal   SpO2: 94%   Weight: 238 lb (108 kg)   Height: 6\' 2"  (1.88 m)    Body mass index is 30.56 kg/m. Wt Readings from Last 3 Encounters:  09/21/20 238 lb (108 kg)  06/13/20 231 lb 3.2 oz (104.9 kg)  02/21/20 227 lb (103 kg)    Physical Exam Constitutional:      General: He is not in acute distress.    Appearance: He is well-developed. He is not diaphoretic.  HENT:     Head: Normocephalic and atraumatic.     Mouth/Throat:     Pharynx: No oropharyngeal exudate.  Eyes:     Conjunctiva/sclera:  Conjunctivae normal.     Pupils: Pupils are equal, round, and reactive to light.  Cardiovascular:     Rate and Rhythm: Normal rate and regular rhythm.     Heart sounds: Normal heart sounds.  Pulmonary:     Effort: Pulmonary effort is normal.     Breath sounds: Normal breath sounds.  Abdominal:     General: Bowel sounds are normal.     Palpations: Abdomen is soft.  Musculoskeletal:  General: No tenderness.     Cervical back: Normal range of motion and neck supple.  Skin:    General: Skin is warm and dry.  Neurological:     Mental Status: He is alert and oriented to person, place, and time.    Labs reviewed: Basic Metabolic Panel: Recent Labs    06/13/20 1215 09/18/20 0805  NA 141 140  K 4.2 4.0  CL 105 105  CO2 27 25  GLUCOSE 95 109*  BUN 9 10  CREATININE 0.96 0.99  CALCIUM 9.4 9.8   Liver Function Tests: Recent Labs    06/13/20 1215 09/18/20 0805  AST 27 25  ALT 38 28  BILITOT 0.5 0.7  PROT 6.7 6.9   No results for input(s): LIPASE, AMYLASE in the last 8760 hours. No results for input(s): AMMONIA in the last 8760 hours. CBC: Recent Labs    06/13/20 1215 09/18/20 0805  WBC 7.3 6.5  NEUTROABS 4,460 3,920  HGB 15.1 15.6  HCT 45.0 45.1  MCV 95.1 94.0  PLT 260 285   Lipid Panel: Recent Labs    09/18/20 0805  CHOL 163  HDL 47  LDLCALC 95  TRIG 116  CHOLHDL 3.5   TSH: No results for input(s): TSH in the last 8760 hours. A1C: Lab Results  Component Value Date   HGBA1C 5.4 09/18/2020     Assessment/Plan 1. Solitary pulmonary nodule -due for follow up at this time. No symptoms of shortness of breath, cough or congestion.  - CT Chest Wo Contrast; Future  2. Mixed hyperlipidemia - LDL at goal on simvastatin 20 mg daily, encouraged dietary modifications as well.   3. Essential hypertension -improved on recheck, encouraged dietary changes for proper control of blood pressure. Continue on losartan 50 mg daily   4. 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  -uses xanax PRN   5. Smoking -ready to quit, has used chantix in the past with good results.  - varenicline (CHANTIX STARTING MONTH PAK) 0.5 MG X 11 & 1 MG X 42 tablet; Take one 0.5 mg tablet by mouth once daily for 3 days, then increase to one 0.5 mg tablet twice daily for 4 days, then increase to one 1 mg tablet twice daily.  Dispense: 53 tablet; Refill: 0  6. AAA (abdominal aortic aneurysm) without rupture (HCC) Stable, continues to follow up with vascular. Encouraged smoking cessation.  7. Moderate persistent asthma without complication -stable, encouraged smoking cessation.  -continues on breo with albuterol PRM  8. Need for influenza vaccination - Flu Vaccine QUAD 6+ mos PF IM (Fluarix Quad PF)  9. Tenderness of neck -without neurological symptoms noted. No decrease in ROM noted or weakness. -encouraged good body positions, proper pillow.  -can use heat, muscle rub (after heat) massage if needed -to notify if symptoms worsen.  10. Class 1 obesity due to excess calories with serious comorbidity and body mass index (BMI) of 30.0 to 30.9 in adult Discussed importance of weight loss with healthy lifestyle with diet and exercise.     Next appt: 6 months, labs prior to appt.  Carlos American. [place], [facility] [phone]

## 2020-09-25 ENCOUNTER — Telehealth: Payer: Self-pay | Admitting: *Deleted

## 2020-09-25 ENCOUNTER — Other Ambulatory Visit: Payer: Self-pay | Admitting: Nurse Practitioner

## 2020-09-25 DIAGNOSIS — F17200 Nicotine dependence, unspecified, uncomplicated: Secondary | ICD-10-CM

## 2020-09-25 DIAGNOSIS — N529 Male erectile dysfunction, unspecified: Secondary | ICD-10-CM

## 2020-09-25 DIAGNOSIS — IMO0001 Reserved for inherently not codable concepts without codable children: Secondary | ICD-10-CM

## 2020-09-25 NOTE — Telephone Encounter (Signed)
Start on Bupropion 150 mg tablet one by mouth daily x 3 days then increase to 150 mg tablet one by mouth twice daily x 12 weeks.then follow up for evaluation.

## 2020-09-25 NOTE — Telephone Encounter (Signed)
Received fax from [facility] stating that the Chantix Prescribed by Janett Billow on 09/21/2020 is NOT Available at Pharmacy. Needs a Replacement Therapy.  Please Advise.

## 2020-09-25 NOTE — Telephone Encounter (Signed)
Can you get chantix from another pharmacy?

## 2020-09-25 NOTE — Telephone Encounter (Signed)
Called [id number] - [place], [place] Phone:  [phone]  Fax:  [phone]     And pharmacist stated that Chantix has been Recalled and they cannot get it.  Please Advise.

## 2020-09-26 ENCOUNTER — Other Ambulatory Visit: Payer: Self-pay | Admitting: Family

## 2020-09-26 ENCOUNTER — Other Ambulatory Visit: Payer: Self-pay | Admitting: Nurse Practitioner

## 2020-09-26 DIAGNOSIS — J4540 Moderate persistent asthma, uncomplicated: Secondary | ICD-10-CM

## 2020-09-26 MED ORDER — BUPROPION HCL ER (XL) 150 MG PO TB24: ORAL_TABLET | ORAL | 3 refills | Status: DC

## 2020-09-26 NOTE — Telephone Encounter (Signed)
Patient notified and agreed.  Medication list updated and Rx sent to [facility] for approval.  Will call back to schedule an appointment.

## 2020-09-26 NOTE — Telephone Encounter (Signed)
Script send to pharmacy

## 2020-09-28 ENCOUNTER — Encounter (HOSPITAL_COMMUNITY): Payer: Self-pay

## 2020-09-28 ENCOUNTER — Other Ambulatory Visit: Payer: Self-pay

## 2020-09-28 ENCOUNTER — Emergency Department (HOSPITAL_COMMUNITY)
Admission: EM | Admit: 2020-09-28 | Discharge: 2020-09-29 | Disposition: A | Discharge status: Home / Self Care | Payer: 59 | Source: Home / Self Care | Attending: Emergency Medicine | Admitting: Emergency Medicine

## 2020-09-28 DIAGNOSIS — L03114 Cellulitis of left upper limb: Secondary | ICD-10-CM | POA: Diagnosis not present

## 2020-09-28 DIAGNOSIS — M60852 Other myositis, left thigh: Secondary | ICD-10-CM | POA: Diagnosis not present

## 2020-09-28 DIAGNOSIS — Z7951 Long term (current) use of inhaled steroids: Secondary | ICD-10-CM | POA: Insufficient documentation

## 2020-09-28 DIAGNOSIS — L03119 Cellulitis of unspecified part of limb: Secondary | ICD-10-CM | POA: Diagnosis not present

## 2020-09-28 DIAGNOSIS — M19042 Primary osteoarthritis, left hand: Secondary | ICD-10-CM | POA: Diagnosis not present

## 2020-09-28 DIAGNOSIS — Y9289 Other specified places as the place of occurrence of the external cause: Secondary | ICD-10-CM | POA: Insufficient documentation

## 2020-09-28 DIAGNOSIS — W540XXD Bitten by dog, subsequent encounter: Secondary | ICD-10-CM | POA: Diagnosis not present

## 2020-09-28 DIAGNOSIS — Y9369 Activity, other involving other sports and athletics played as a team or group: Secondary | ICD-10-CM | POA: Insufficient documentation

## 2020-09-28 DIAGNOSIS — M7989 Other specified soft tissue disorders: Secondary | ICD-10-CM | POA: Diagnosis not present

## 2020-09-28 DIAGNOSIS — E785 Hyperlipidemia, unspecified: Secondary | ICD-10-CM | POA: Diagnosis not present

## 2020-09-28 DIAGNOSIS — Z20822 Contact with and (suspected) exposure to covid-19: Secondary | ICD-10-CM | POA: Diagnosis not present

## 2020-09-28 DIAGNOSIS — S61452A Open bite of left hand, initial encounter: Secondary | ICD-10-CM

## 2020-09-28 DIAGNOSIS — W540XXA Bitten by dog, initial encounter: Secondary | ICD-10-CM

## 2020-09-28 DIAGNOSIS — S71152A Open bite, left thigh, initial encounter: Secondary | ICD-10-CM | POA: Insufficient documentation

## 2020-09-28 DIAGNOSIS — J438 Other emphysema: Secondary | ICD-10-CM | POA: Diagnosis not present

## 2020-09-28 DIAGNOSIS — F17210 Nicotine dependence, cigarettes, uncomplicated: Secondary | ICD-10-CM | POA: Insufficient documentation

## 2020-09-28 DIAGNOSIS — Z85828 Personal history of other malignant neoplasm of skin: Secondary | ICD-10-CM | POA: Insufficient documentation

## 2020-09-28 DIAGNOSIS — Z7982 Long term (current) use of aspirin: Secondary | ICD-10-CM | POA: Insufficient documentation

## 2020-09-28 DIAGNOSIS — L03116 Cellulitis of left lower limb: Secondary | ICD-10-CM | POA: Diagnosis not present

## 2020-09-28 DIAGNOSIS — J45909 Unspecified asthma, uncomplicated: Secondary | ICD-10-CM | POA: Insufficient documentation

## 2020-09-28 DIAGNOSIS — Z7289 Other problems related to lifestyle: Secondary | ICD-10-CM | POA: Diagnosis not present

## 2020-09-28 DIAGNOSIS — M60842 Other myositis, left hand: Secondary | ICD-10-CM | POA: Diagnosis not present

## 2020-09-28 DIAGNOSIS — Z79899 Other long term (current) drug therapy: Secondary | ICD-10-CM | POA: Insufficient documentation

## 2020-09-28 DIAGNOSIS — S61052A Open bite of left thumb without damage to nail, initial encounter: Secondary | ICD-10-CM | POA: Diagnosis not present

## 2020-09-28 DIAGNOSIS — L02519 Cutaneous abscess of unspecified hand: Secondary | ICD-10-CM | POA: Diagnosis not present

## 2020-09-28 DIAGNOSIS — I10 Essential (primary) hypertension: Secondary | ICD-10-CM | POA: Insufficient documentation

## 2020-09-28 DIAGNOSIS — J439 Emphysema, unspecified: Secondary | ICD-10-CM | POA: Diagnosis not present

## 2020-09-28 DIAGNOSIS — Z720 Tobacco use: Secondary | ICD-10-CM | POA: Diagnosis not present

## 2020-09-28 NOTE — ED Triage Notes (Signed)
Pt reports getting bit by dog that he has known for a long time and says he has had his shots. Pt has puncture wounds to left thigh and left thumb. These areas are wrapped with bandage and kerlix.

## 2020-09-29 ENCOUNTER — Emergency Department (HOSPITAL_COMMUNITY): Payer: 59

## 2020-09-29 IMAGING — DX DG HAND COMPLETE 3+V*L*
3 series · 3 of 3 positions shown · non-contrast
Comparison: None.

CLINICAL DATA: Dog bite left thumb

EXAM:
LEFT HAND - COMPLETE 3+ VIEW

[hand pa]
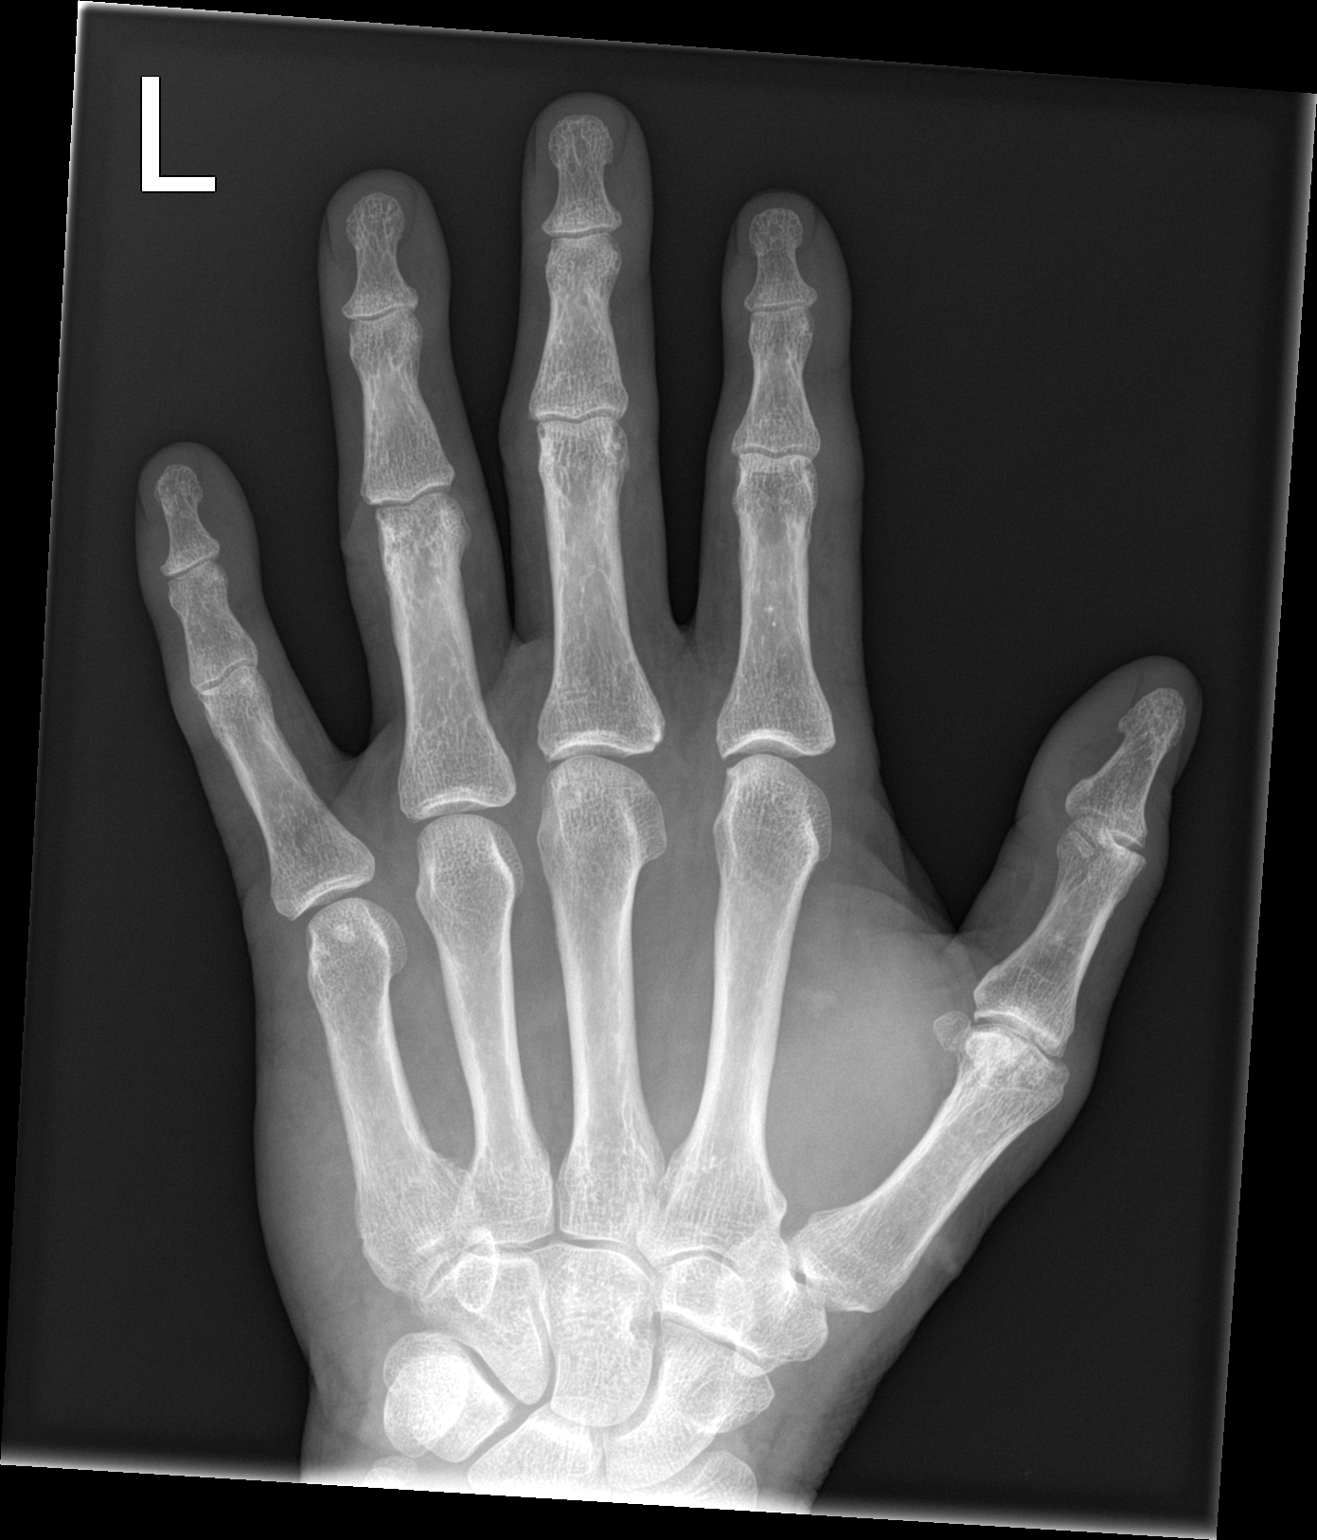

[hand obl]
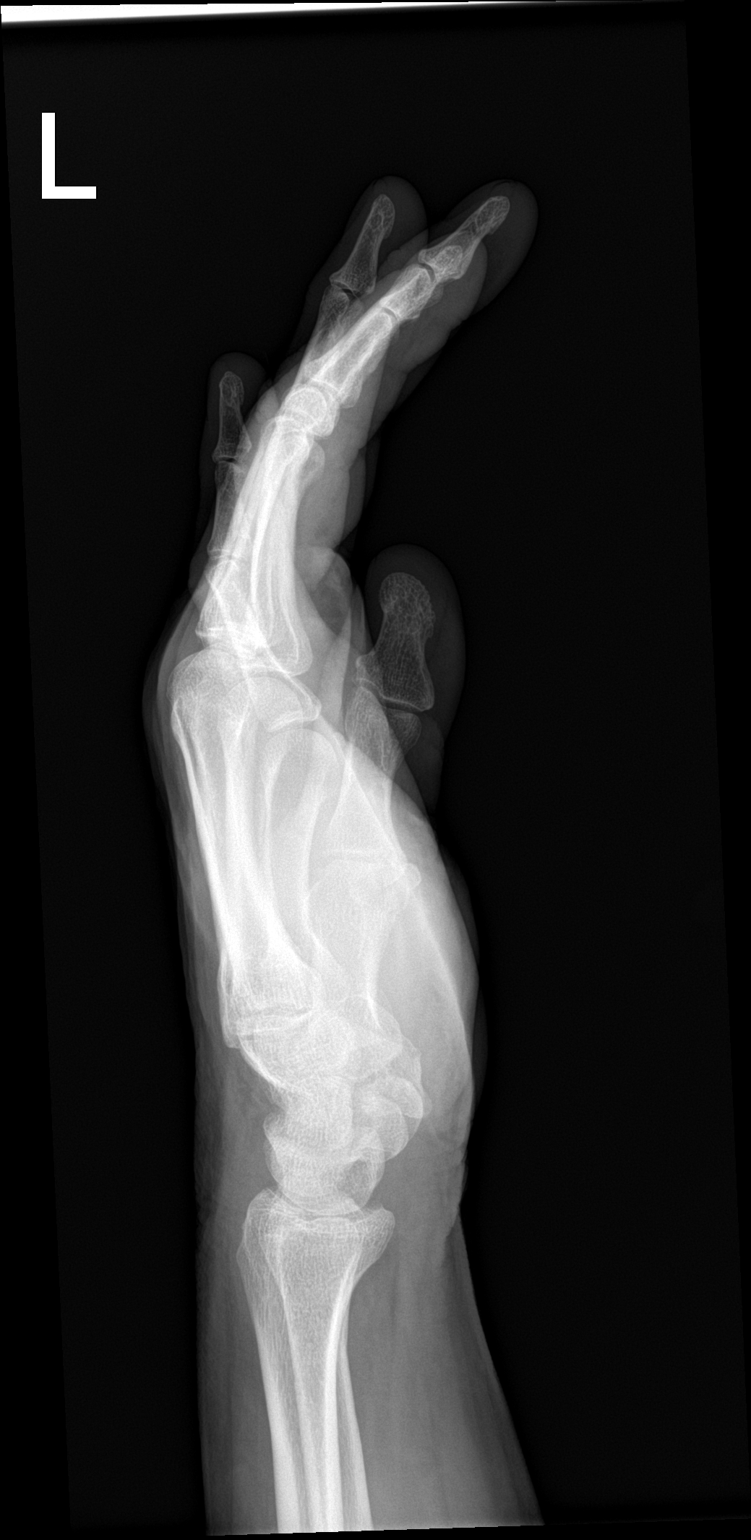

[hand lat]
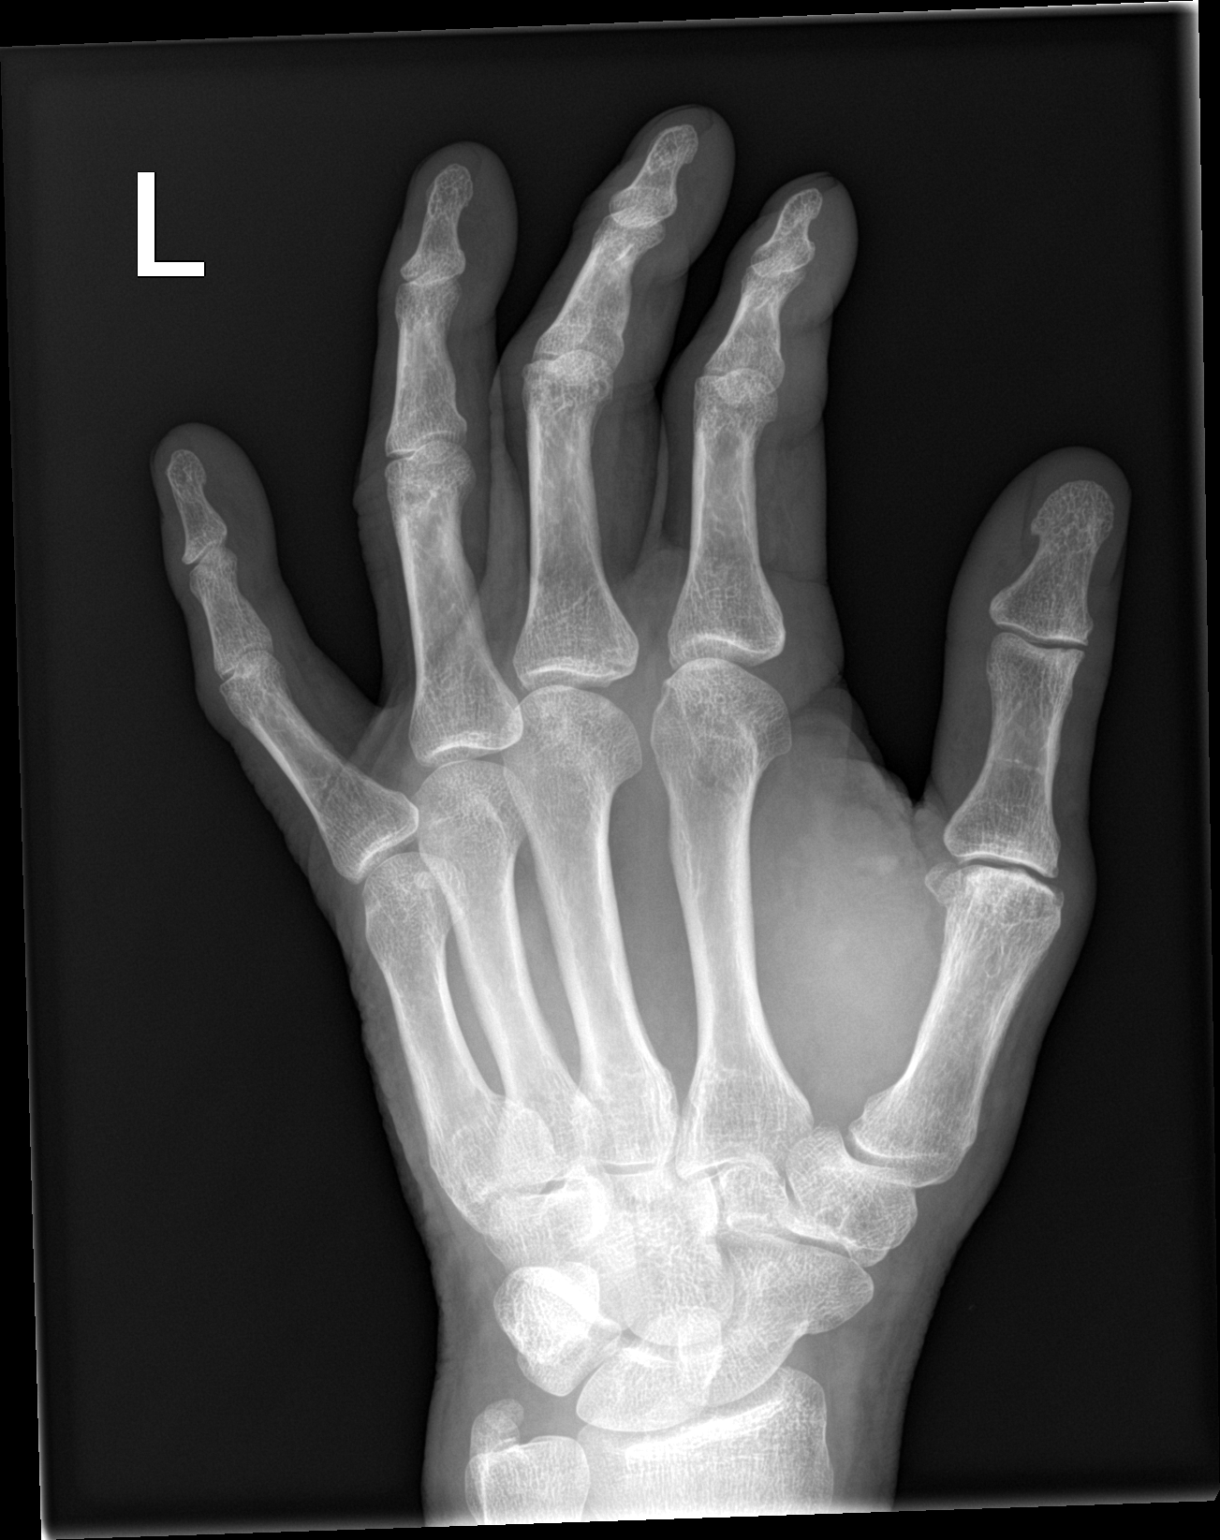

[3 of 3 positions shown; findings below may reference images not displayed]

FINDINGS: There is no evidence of fracture or dislocation. There is no
evidence of arthropathy or other focal bone abnormality. Soft
tissues are unremarkable. No radiopaque foreign body or soft tissue
gas.
IMPRESSION: No acute bony abnormality.

## 2020-09-29 MED ORDER — TRAMADOL HCL 50 MG PO TABS
100.0000 mg | ORAL_TABLET | Freq: Four times a day (QID) | ORAL | 0 refills | Status: DC | PRN
Start: 2020-09-29 — End: 2020-11-22

## 2020-09-29 MED ORDER — AMOXICILLIN-POT CLAVULANATE 500-125 MG PO TABS
1.0000 | ORAL_TABLET | Freq: Once | ORAL | Status: AC
  Administered 2020-09-29: 500 mg via ORAL
  Filled 2020-09-29: qty 1

## 2020-09-29 MED ORDER — ACETAMINOPHEN 500 MG PO TABS
1000.0000 mg | ORAL_TABLET | Freq: Once | ORAL | Status: AC
  Administered 2020-09-29: 1000 mg via ORAL
  Filled 2020-09-29: qty 2

## 2020-09-29 MED ORDER — AMOXICILLIN-POT CLAVULANATE 875-125 MG PO TABS: 1.0000 | ORAL_TABLET | Freq: Two times a day (BID) | ORAL | 0 refills | Status: DC

## 2020-09-29 MED ORDER — TRAMADOL HCL 50 MG PO TABS
100.0000 mg | ORAL_TABLET | Freq: Once | ORAL | Status: AC
  Administered 2020-09-29: 100 mg via ORAL
  Filled 2020-09-29: qty 2

## 2020-09-29 NOTE — Discharge Instructions (Addendum)
Keep the wounds clean and dry.  Recheck if any of the wounds get red, swollen, more painful, drain pus, you see a red streak or you develop a fever.  Take the antibiotic until gone.  Take the tramadol 50 to 100 mg 4 times a day with acetaminophen 650 mg 4 times a day for pain.

## 2020-09-29 NOTE — ED Provider Notes (Signed)
[facility] EMERGENCY DEPARTMENT Provider Note   CSN: [id number] Arrival date & time: 09/28/20  2125   Time seen 12:50 AM  History Chief Complaint  Patient presents with  . Animal Bite    Aaron [name] is a 64 y.o. male.  HPI   Patient reports that her 62 year old mixed breed dog which weighs about 80 pounds bit him tonight.  He states they were playing with a ball and he came up behind the dog to take it away from him and may have startled him.  He got better on the left hand and in the left thigh.  This is the first time the dog has bitten.  He is unsure of his last tetanus but looking at his chart it was in 2015.  He also got the flu shot last week.  Patient is right-handed.  PCP [name], NP   Past Medical History:  Diagnosis Date  . Abdominal aortic aneurysm [facility])    sees Dr. Einar Gip for cardiac f/u, [phone]  . Arthritis    lower back  . Asthma    followed by Dr. Berdie Ogren  . Chronic airway obstruction, not elsewhere classified   . Dyspnea    normal PFT April 2012  . External hemorrhoids without mention of complication   . GERD (gastroesophageal reflux disease)   . Gout   . H/O hiatal hernia   . Hemorrhoids   . HTN (hypertension)    hx of sees Dr. Graylin Shiver  . Hyperlipidemia   . Insomnia, unspecified   . Other abnormal blood chemistry   . Other dyspnea and respiratory abnormality   . Other malaise and fatigue   . Other specified erythematous condition(695.89)   . Other testicular dysfunction   . Prostatitis, unspecified   . Rash 2011   left chest, biopsy 2012 Dr. Rozann Lesches, results pending  . Skin cancer   . Tobacco abuse     Patient Active Problem List   Diagnosis Date Noted  . Thrombosed external hemorrhoid 12/24/2016  . Uncomplicated asthma 42/35/3614  . High risk medication use 12/24/2016  . Prostate cancer screening 12/24/2016  . Diarrhea 09/17/2016  . Other emphysema ([place]) 04/15/2015  . Erectile dysfunction 12/12/2014  .  Fatty liver 04/04/2014  . Pulmonary nodules 04/04/2014  . Chronic back pain 12/06/2013  . Gout 12/06/2013  . Routine general medical examination at a health care facility 12/06/2013  . GERD (gastroesophageal reflux disease) 12/06/2013  . Tongue ulcer 12/06/2013  . AAA (abdominal aortic aneurysm) without rupture ([place]) 11/01/2013  . Depression 06/29/2013  . Musculoskeletal pain 06/07/2013  . Lung nodule, multiple 05/26/2013  . Chronic cough 03/21/2013  . Gouty arthropathy 02/23/2013  . Prediabetes 02/23/2013  . Need for prophylactic vaccination and inoculation against influenza 09/21/2012  . Pre-operative respiratory examination 09/21/2012  . Asthma, moderate persistent 03/05/2011  . HYPERLIPIDEMIA 01/20/2011  . Hx of smoking 01/20/2011  . Essential hypertension 01/20/2011    Past Surgical History:  Procedure Laterality Date  . ABDOMINAL AORTIC ENDOVASCULAR STENT GRAFT  09/22/2012   Procedure: ABDOMINAL AORTIC ENDOVASCULAR STENT GRAFT;  Surgeon: Mal Misty, MD;  Location: [place];  Service: Vascular;  Laterality: N/A;  GORE; ultrasound guided.  . APPENDECTOMY    . EVALUATION UNDER ANESTHESIA WITH HEMORRHOIDECTOMY N/A 12/31/2016   Procedure: EXAM UNDER ANESTHESIA WITH HEMORRHOIDECTOMY;  Surgeon: [name], MD;  Location: [place];  Service: General;  Laterality: N/A;  . SKIN CANCER EXCISION    . TOE SURGERY  01/2012  joint of left great toe  . TONSILLECTOMY    . TONSILLECTOMY    . TYMPANOPLASTY    . WRIST SURGERY     left       Family History  Problem Relation Age of Onset  . Heart attack Father 5  . COPD Father   . Heart disease Father   . Asthma Daughter   . Cancer Mother        Breast  . Lupus Daughter   . Diabetes Other        Grandson   . Colon cancer Neg Hx   . Pancreatic cancer Neg Hx   . Esophageal cancer Neg Hx   . Stomach cancer Neg Hx   . Rectal cancer Neg Hx     Social History   Tobacco Use  . Smoking status: Current  Every Day Smoker    Packs/day: 0.25    Years: 38.00    Pack years: 9.50    Types: Cigarettes    Last attempt to quit: 09/02/2012    Years since quitting: 8.0  . Smokeless tobacco: Former Systems developer    Types: Snuff  Vaping Use  . Vaping Use: Never used  Substance Use Topics  . Alcohol use: Yes    Alcohol/week: 0.0 standard drinks    Comment: occ  . Drug use: No    Home Medications Prior to Admission medications   Medication Sig Start Date End Date Taking? Authorizing Provider  albuterol (PROVENTIL HFA;VENTOLIN HFA) 108 (90 Base) MCG/ACT inhaler Inhale 2 puffs into the lungs every 6 (six) hours as needed. For cough and wheezing. 08/30/18   [name], NP  ALPRAZolam Duanne Moron) 0.25 MG tablet TAKE 1 TABLET BY MOUTH ONCE A DAY AS NEEDED FOR ANXIETY 08/24/20   [name], NP  amoxicillin-clavulanate (AUGMENTIN) 875-125 MG tablet Take 1 tablet by mouth every 12 (twelve) hours. 09/29/20   Rolland Porter, MD  aspirin EC 81 MG tablet Take 81 mg by mouth daily.    [provider]  BREO ELLIPTA 200-25 MCG/INH AEPB INHALE 1 PUFF INTO LUNGS ONCE DAILY. 09/26/20   [name], NP  buPROPion (WELLBUTRIN XL) 150 MG 24 hr tablet Take one tablet by mouth once daily for 3 days, then Increase to One by mouth twice daily. 09/26/20   Ngetich, Dinah C, NP  losartan (COZAAR) 50 MG tablet TAKE 1 TABLET BY MOUTH ONCE DAILY TO CONTROL BLOOD PRESSURE DX I10 06/04/20   [name], NP  pantoprazole (PROTONIX) 40 MG tablet Take 1 tablet (40 mg total) by mouth daily. 06/19/20   [name], NP  sildenafil (VIAGRA) 50 MG tablet TAKE 1 TABLET BY MOUTH AS NEEDED FOR ERECTILE DYSFUNCTION 09/26/20   [name], NP  simvastatin (ZOCOR) 20 MG tablet Take one tablet by mouth once daily at bedtime. 06/21/20   [name], NP  traMADol (ULTRAM) 50 MG tablet Take 2 tablets (100 mg total) by mouth every 6 (six) hours as needed. 09/29/20   Rolland Porter, MD    Allergies    Patient has no  known allergies.  Review of Systems   Review of Systems  All other systems reviewed and are negative.   Physical Exam Updated Vital Signs BP (!) 147/83 (BP Location: Right Arm)   Pulse (!) 50   Temp 97.9 F (36.6 C) (Oral)   Resp 18   Ht 6\' 2"  (1.88 m)   Wt 106.1 kg   SpO2 97%   BMI  30.04 kg/m   Physical Exam Vitals and nursing note reviewed.  Constitutional:      General: He is not in acute distress.    Appearance: Normal appearance. He is obese.  HENT:     Head: Normocephalic and atraumatic.  Eyes:     Extraocular Movements: Extraocular movements intact.     Conjunctiva/sclera: Conjunctivae normal.  Cardiovascular:     Rate and Rhythm: Normal rate.  Pulmonary:     Effort: Pulmonary effort is normal. No respiratory distress.  Musculoskeletal:     Cervical back: Normal range of motion.  Skin:    General: Skin is warm.     Comments: Patient has a puncture wound on his left hand on the dorsum over the midportion of the metacarpal of the thumb.  There is also another superficial abrasion between the metacarpals of the thumb and index finger.  Patient has some puncture wounds on the thenar eminence of his left hand with some fat tissue protrusion.  There also is a puncture type wound near the IP joint on the volar aspect of the left thumb with good range of motion.  Patient has puncture type bite marks on the dorsum and the lateral aspect of his distal left thigh without active bleeding.  Neurological:     General: No focal deficit present.     Mental Status: He is alert and oriented to person, place, and time.     Cranial Nerves: No cranial nerve deficit.  Psychiatric:        Mood and Affect: Mood normal.        Behavior: Behavior normal.        Thought Content: Thought content normal.           ED Results / Procedures / Treatments   Labs (all labs ordered are listed, but only abnormal results are displayed) Labs Reviewed - No data to  display  EKG None  Radiology DG Hand Complete Left  Result Date: 09/29/2020 CLINICAL DATA:  Dog bite left thumb EXAM: LEFT HAND - COMPLETE 3+ VIEW COMPARISON:  None. FINDINGS: There is no evidence of fracture or dislocation. There is no evidence of arthropathy or other focal bone abnormality. Soft tissues are unremarkable. No radiopaque foreign body or soft tissue gas. IMPRESSION: No acute bony abnormality. Electronically Signed   By: Rolm Baptise M.D.   On: 09/29/2020 01:47    Procedures Procedures (including critical care time)  Medications Ordered in ED Medications  amoxicillin-clavulanate (AUGMENTIN) 500-125 MG per tablet 500 mg (500 mg Oral Given 09/29/20 0146)  traMADol (ULTRAM) tablet 100 mg (100 mg Oral Given 09/29/20 0146)  acetaminophen (TYLENOL) tablet 1,000 mg (1,000 mg Oral Given 09/29/20 0146)    ED Course  I have reviewed the triage vital signs and the nursing notes.  Pertinent labs & imaging results that were available during my care of the patient were reviewed by me and considered in my medical decision making (see chart for details).    MDM Rules/Calculators/A&P                          Patient's wounds were cleaned and dressed by nursing staff.  X-ray was obtained to look for air in the IP joint of the thumb, piece of tooth, or fracture.  Patient was started on Augmentin because it was a dog bite.   Final Clinical Impression(s) / ED Diagnoses Final diagnoses:  Dog bite of left hand, initial encounter  Dog  bite of left thigh, initial encounter    Rx / DC Orders ED Discharge Orders         Ordered    amoxicillin-clavulanate (AUGMENTIN) 875-125 MG tablet  Every 12 hours        09/29/20 0253    traMADol (ULTRAM) 50 MG tablet  Every 6 hours PRN        09/29/20 0253        OTC acetaminophen  Plan discharge  Rolland Porter, MD, Barbette Or, MD 09/29/20 [phone]

## 2020-09-30 ENCOUNTER — Emergency Department (HOSPITAL_COMMUNITY): Payer: 59

## 2020-09-30 ENCOUNTER — Inpatient Hospital Stay (HOSPITAL_COMMUNITY)
Admission: EM | Admit: 2020-09-30 | Discharge: 2020-10-02 | DRG: 603 | Disposition: A | Discharge status: Home / Self Care | Payer: 59 | Attending: Internal Medicine | Admitting: Internal Medicine

## 2020-09-30 ENCOUNTER — Encounter (HOSPITAL_COMMUNITY): Payer: Self-pay | Admitting: *Deleted

## 2020-09-30 DIAGNOSIS — F17210 Nicotine dependence, cigarettes, uncomplicated: Secondary | ICD-10-CM | POA: Diagnosis present

## 2020-09-30 DIAGNOSIS — Z7982 Long term (current) use of aspirin: Secondary | ICD-10-CM

## 2020-09-30 DIAGNOSIS — E785 Hyperlipidemia, unspecified: Secondary | ICD-10-CM | POA: Diagnosis present

## 2020-09-30 DIAGNOSIS — K219 Gastro-esophageal reflux disease without esophagitis: Secondary | ICD-10-CM | POA: Diagnosis present

## 2020-09-30 DIAGNOSIS — Z20822 Contact with and (suspected) exposure to covid-19: Secondary | ICD-10-CM | POA: Diagnosis present

## 2020-09-30 DIAGNOSIS — F32A Depression, unspecified: Secondary | ICD-10-CM | POA: Diagnosis present

## 2020-09-30 DIAGNOSIS — M60852 Other myositis, left thigh: Secondary | ICD-10-CM | POA: Diagnosis present

## 2020-09-30 DIAGNOSIS — W540XXA Bitten by dog, initial encounter: Secondary | ICD-10-CM | POA: Diagnosis present

## 2020-09-30 DIAGNOSIS — F419 Anxiety disorder, unspecified: Secondary | ICD-10-CM | POA: Diagnosis present

## 2020-09-30 DIAGNOSIS — M60842 Other myositis, left hand: Secondary | ICD-10-CM | POA: Diagnosis present

## 2020-09-30 DIAGNOSIS — Z7289 Other problems related to lifestyle: Secondary | ICD-10-CM

## 2020-09-30 DIAGNOSIS — L03119 Cellulitis of unspecified part of limb: Secondary | ICD-10-CM

## 2020-09-30 DIAGNOSIS — I10 Essential (primary) hypertension: Secondary | ICD-10-CM | POA: Diagnosis not present

## 2020-09-30 DIAGNOSIS — Z7951 Long term (current) use of inhaled steroids: Secondary | ICD-10-CM

## 2020-09-30 DIAGNOSIS — L03114 Cellulitis of left upper limb: Principal | ICD-10-CM | POA: Diagnosis present

## 2020-09-30 DIAGNOSIS — L02519 Cutaneous abscess of unspecified hand: Secondary | ICD-10-CM

## 2020-09-30 DIAGNOSIS — I714 Abdominal aortic aneurysm, without rupture: Secondary | ICD-10-CM | POA: Diagnosis present

## 2020-09-30 DIAGNOSIS — J438 Other emphysema: Secondary | ICD-10-CM | POA: Diagnosis present

## 2020-09-30 DIAGNOSIS — Z85828 Personal history of other malignant neoplasm of skin: Secondary | ICD-10-CM

## 2020-09-30 DIAGNOSIS — Z720 Tobacco use: Secondary | ICD-10-CM

## 2020-09-30 DIAGNOSIS — W540XXD Bitten by dog, subsequent encounter: Secondary | ICD-10-CM

## 2020-09-30 DIAGNOSIS — J439 Emphysema, unspecified: Secondary | ICD-10-CM | POA: Diagnosis present

## 2020-09-30 DIAGNOSIS — Z79899 Other long term (current) drug therapy: Secondary | ICD-10-CM

## 2020-09-30 DIAGNOSIS — L03116 Cellulitis of left lower limb: Secondary | ICD-10-CM | POA: Diagnosis present

## 2020-09-30 DIAGNOSIS — R0789 Other chest pain: Secondary | ICD-10-CM | POA: Diagnosis present

## 2020-09-30 LAB — CBC WITH DIFFERENTIAL/PLATELET
Abs Immature Granulocytes: 0.07 10*3/uL (ref 0.00–0.07)
Basophils Absolute: 0.1 10*3/uL (ref 0.0–0.1)
Basophils Relative: 1 %
Eosinophils Absolute: 0.2 10*3/uL (ref 0.0–0.5)
Eosinophils Relative: 2 %
HCT: 43.1 % (ref 39.0–52.0)
Hemoglobin: 14.7 g/dL (ref 13.0–17.0)
Immature Granulocytes: 1 %
Lymphocytes Relative: 20 %
Lymphs Abs: 1.8 10*3/uL (ref 0.7–4.0)
MCH: 32 pg (ref 26.0–34.0)
MCHC: 34.1 g/dL (ref 30.0–36.0)
MCV: 93.7 fL (ref 80.0–100.0)
Monocytes Absolute: 1 10*3/uL (ref 0.1–1.0)
Monocytes Relative: 11 %
Neutro Abs: 5.7 10*3/uL (ref 1.7–7.7)
Neutrophils Relative %: 65 %
Platelets: 244 10*3/uL (ref 150–400)
RBC: 4.6 MIL/uL (ref 4.22–5.81)
RDW: 12.3 % (ref 11.5–15.5)
WBC: 8.8 10*3/uL (ref 4.0–10.5)
nRBC: 0 % (ref 0.0–0.2)

## 2020-09-30 LAB — COMPREHENSIVE METABOLIC PANEL
ALT: 37 U/L (ref 0–44)
AST: 36 U/L (ref 15–41)
Albumin: 4 g/dL (ref 3.5–5.0)
Alkaline Phosphatase: 68 U/L (ref 38–126)
Anion gap: 8 (ref 5–15)
BUN: 8 mg/dL (ref 8–23)
CO2: 24 mmol/L (ref 22–32)
Calcium: 8.5 mg/dL — ABNORMAL LOW (ref 8.9–10.3)
Chloride: 101 mmol/L (ref 98–111)
Creatinine, Ser: 0.8 mg/dL (ref 0.61–1.24)
GFR, Estimated: >60 mL/min (ref >60)
Glucose, Bld: 99 mg/dL (ref 70–99)
Potassium: 3.5 mmol/L (ref 3.5–5.1)
Sodium: 133 mmol/L — ABNORMAL LOW (ref 135–145)
Total Bilirubin: 0.8 mg/dL (ref 0.3–1.2)
Total Protein: 7.3 g/dL (ref 6.5–8.1)

## 2020-09-30 LAB — RESPIRATORY PANEL BY RT PCR (FLU A&B, COVID)
Influenza A by PCR: NEGATIVE
Influenza B by PCR: NEGATIVE
SARS Coronavirus 2 by RT PCR: NEGATIVE

## 2020-09-30 IMAGING — DX DG HAND COMPLETE 3+V*L*
3 series · 3 of 3 positions shown · non-contrast
Comparison: [DATE]

CLINICAL DATA: Dog bite, wound check, swelling

EXAM:
LEFT HAND - COMPLETE 3+ VIEW

[hand ap]
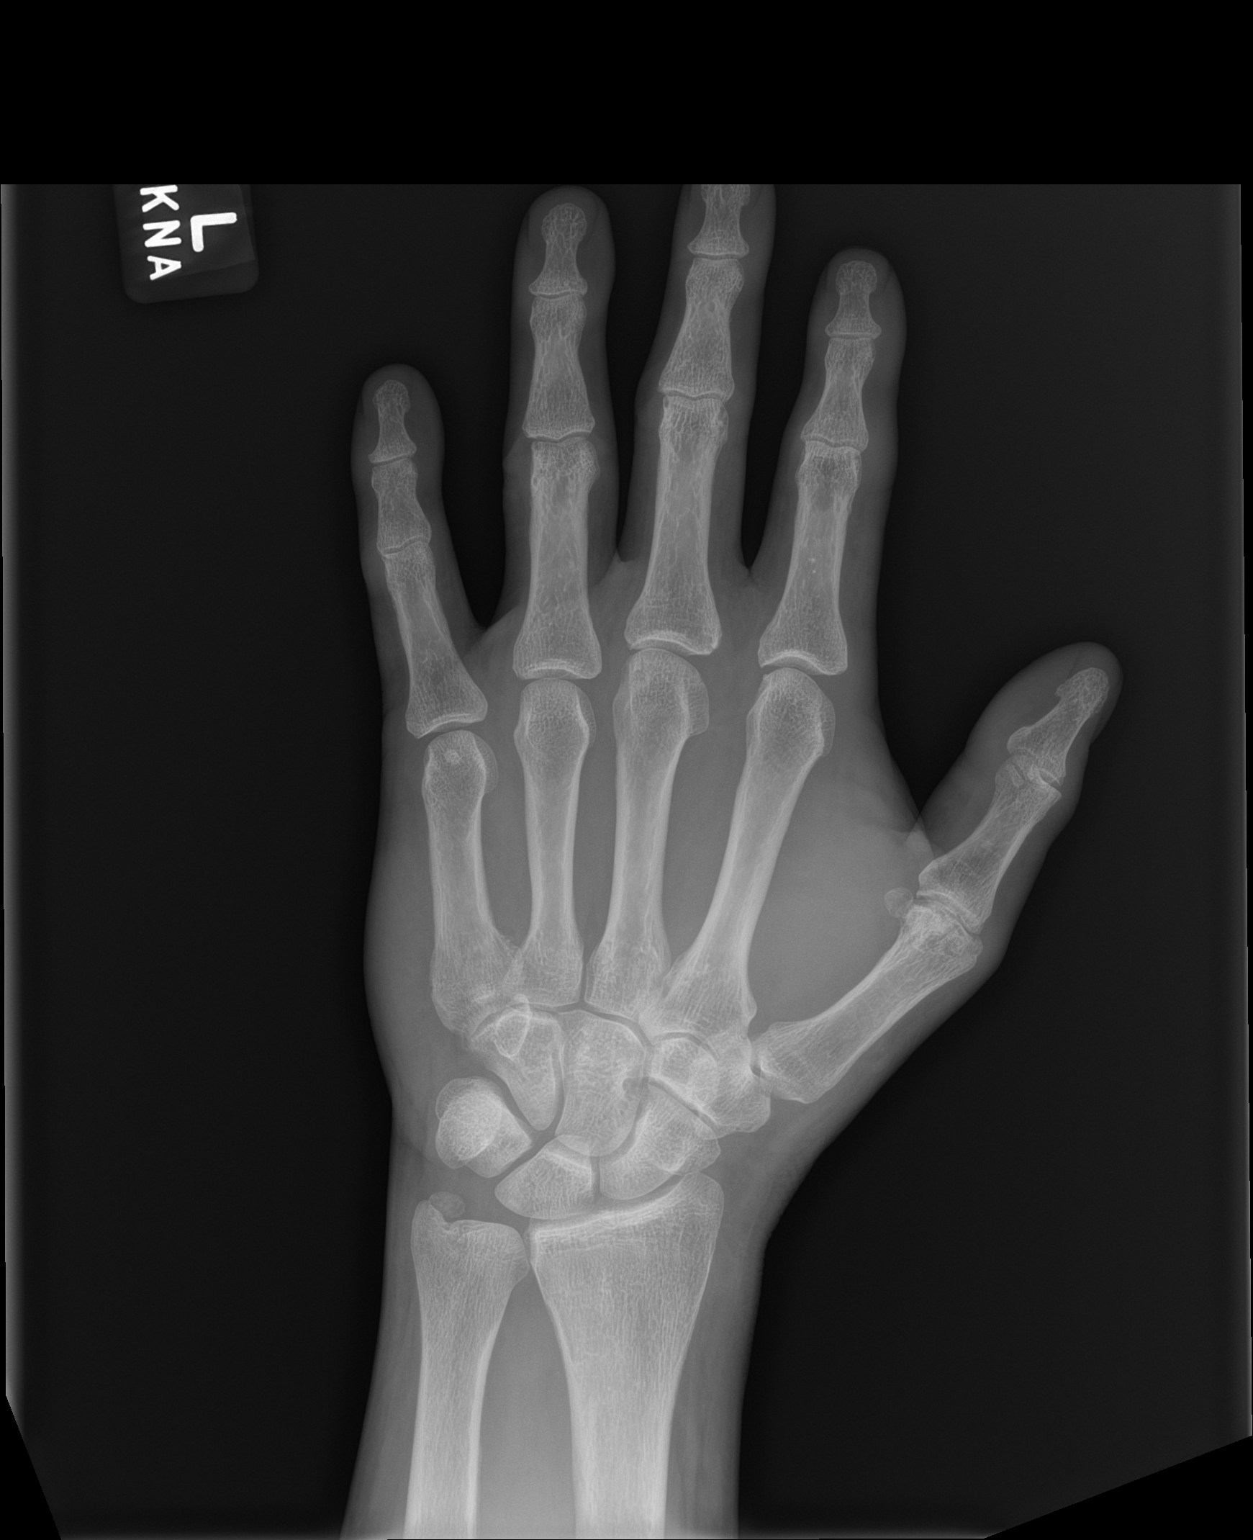

[hand obl]
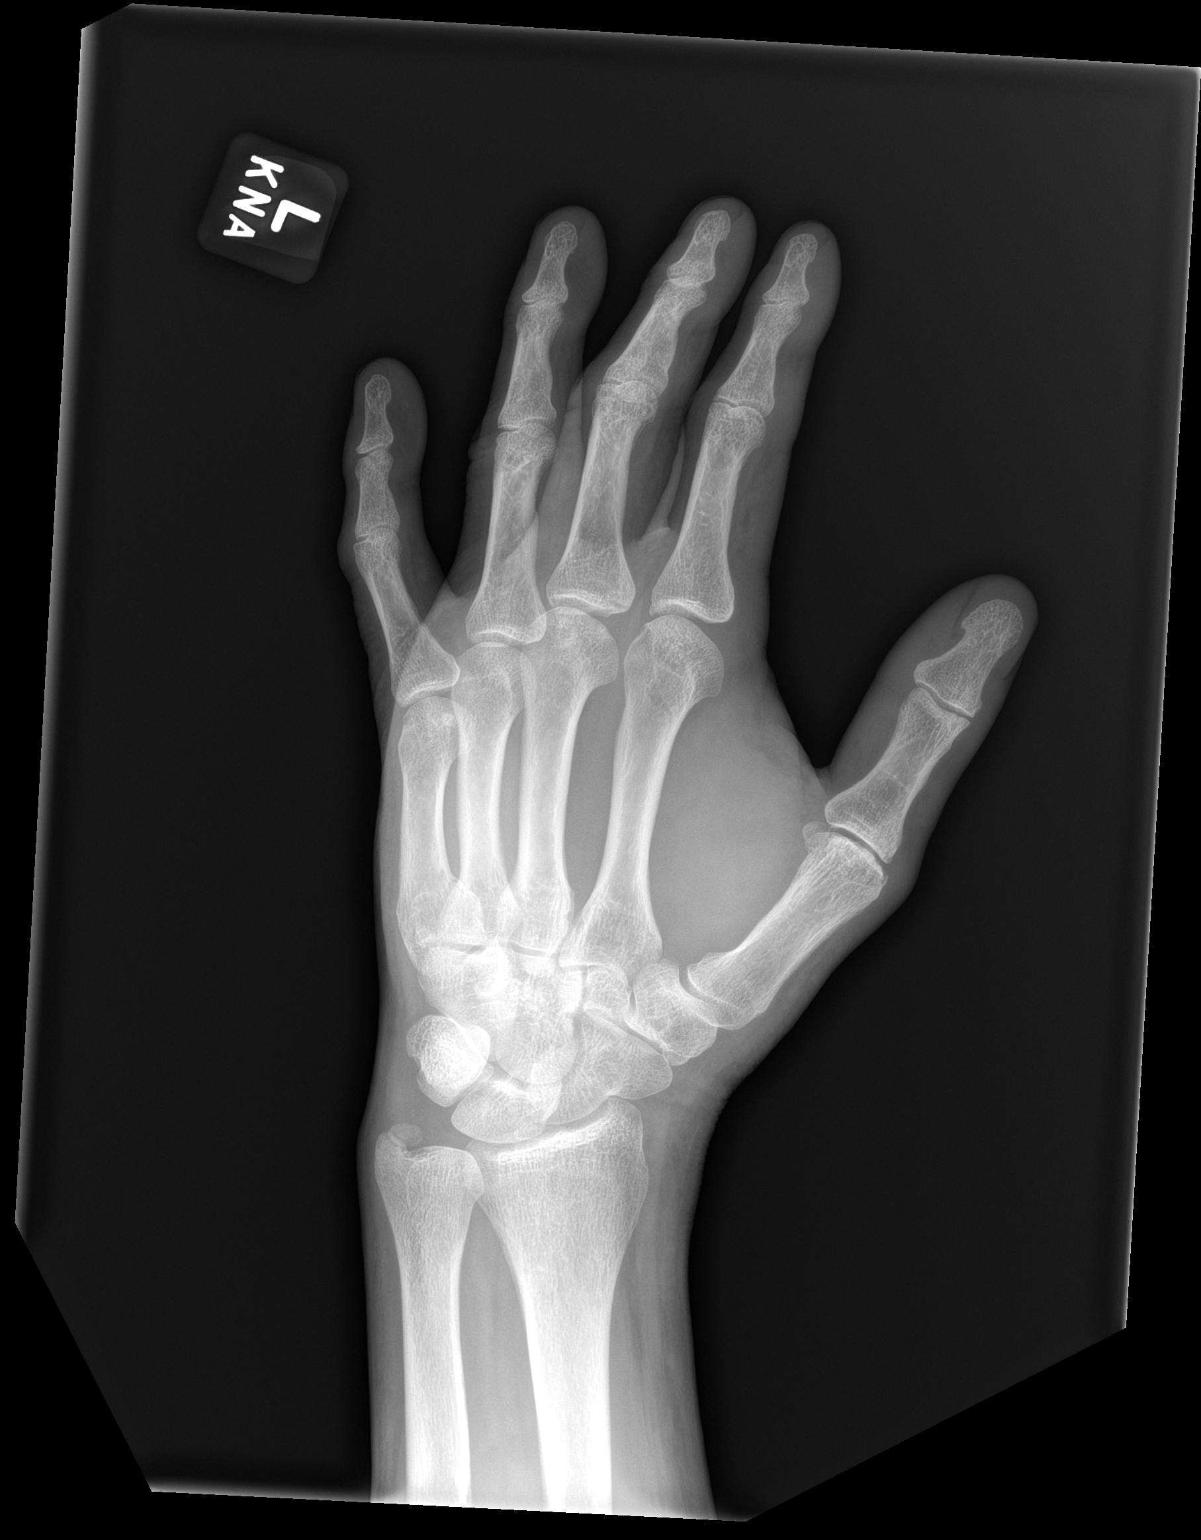

[hand lat]
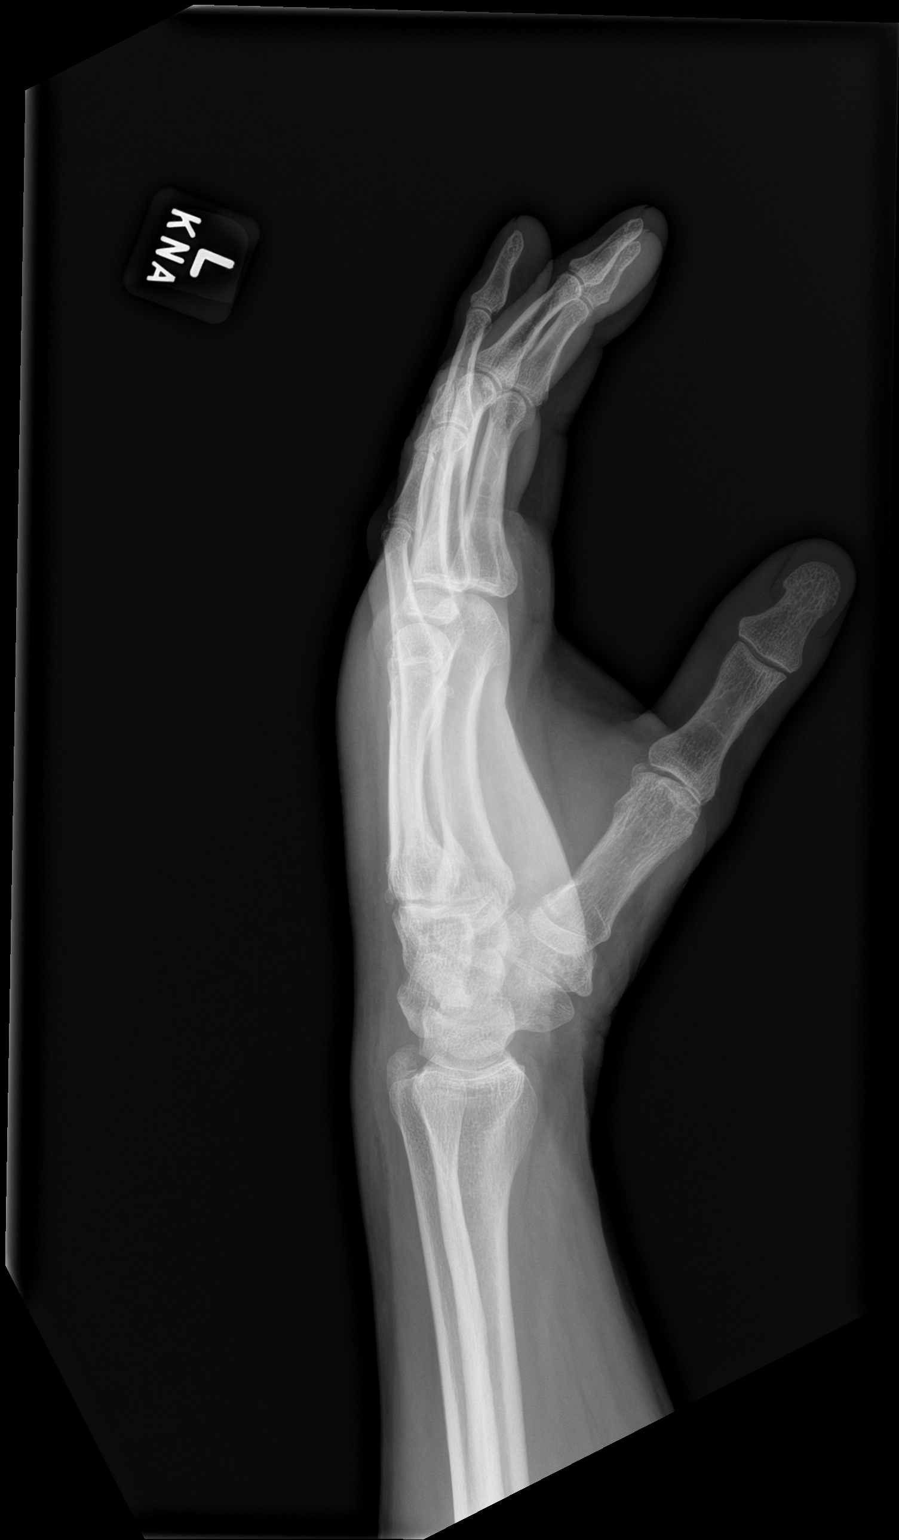

[3 of 3 positions shown; findings below may reference images not displayed]

FINDINGS: Osseous mineralization normal.

Mild degenerative changes at first MCP joint. Remaining joint spaces
preserved.

Non fused ossicle at ulnar styloid.

No acute fracture, dislocation, or bone destruction.

Increased soft tissue swelling of LEFT hand and wrist since prior
study.
IMPRESSION: Increased soft tissue swelling.

No acute osseous abnormalities.

Degenerative changes LEFT first MCP joint.

## 2020-09-30 MED ORDER — PANTOPRAZOLE SODIUM 40 MG PO TBEC
40.0000 mg | DELAYED_RELEASE_TABLET | Freq: Every day | ORAL | Status: DC
  Administered 2020-10-01 – 2020-10-02 (×2): 40 mg via ORAL
  Filled 2020-09-30 (×2): qty 1

## 2020-09-30 MED ORDER — ALBUTEROL SULFATE HFA 108 (90 BASE) MCG/ACT IN AERS: 2.0000 | INHALATION_SPRAY | Freq: Four times a day (QID) | RESPIRATORY_TRACT | Status: DC | PRN

## 2020-09-30 MED ORDER — SIMVASTATIN 20 MG PO TABS
20.0000 mg | ORAL_TABLET | Freq: Every day | ORAL | Status: DC
  Administered 2020-09-30 – 2020-10-01 (×2): 20 mg via ORAL
  Filled 2020-09-30 (×2): qty 1

## 2020-09-30 MED ORDER — ONDANSETRON HCL 4 MG/2ML IJ SOLN: 4.0000 mg | Freq: Four times a day (QID) | INTRAMUSCULAR | Status: DC | PRN

## 2020-09-30 MED ORDER — NICOTINE 21 MG/24HR TD PT24
21.0000 mg | MEDICATED_PATCH | Freq: Every day | TRANSDERMAL | Status: DC
  Filled 2020-09-30 (×2): qty 1

## 2020-09-30 MED ORDER — FLUTICASONE FUROATE-VILANTEROL 200-25 MCG/INH IN AEPB
1.0000 | INHALATION_SPRAY | Freq: Every day | RESPIRATORY_TRACT | Status: DC
  Administered 2020-09-30 – 2020-10-02 (×3): 1 via RESPIRATORY_TRACT
  Filled 2020-09-30 (×2): qty 28

## 2020-09-30 MED ORDER — OXYCODONE HCL 5 MG PO TABS
5.0000 mg | ORAL_TABLET | ORAL | Status: DC | PRN
  Administered 2020-09-30 – 2020-10-01 (×4): 5 mg via ORAL
  Filled 2020-09-30 (×5): qty 1

## 2020-09-30 MED ORDER — LOSARTAN POTASSIUM 50 MG PO TABS
50.0000 mg | ORAL_TABLET | Freq: Every day | ORAL | Status: DC
  Administered 2020-09-30 – 2020-10-01 (×2): 50 mg via ORAL
  Filled 2020-09-30 (×2): qty 1

## 2020-09-30 MED ORDER — SODIUM CHLORIDE 0.9 % IV SOLN
3.0000 g | Freq: Four times a day (QID) | INTRAVENOUS | Status: DC
  Administered 2020-09-30 – 2020-10-02 (×8): 3 g via INTRAVENOUS
  Filled 2020-09-30 (×3): qty 8
  Filled 2020-09-30: qty 3
  Filled 2020-09-30 (×9): qty 8

## 2020-09-30 MED ORDER — ALPRAZOLAM 0.25 MG PO TABS
0.2500 mg | ORAL_TABLET | Freq: Every day | ORAL | Status: DC | PRN
  Administered 2020-09-30 – 2020-10-01 (×2): 0.25 mg via ORAL
  Filled 2020-09-30 (×2): qty 1

## 2020-09-30 MED ORDER — ACETAMINOPHEN 325 MG PO TABS
650.0000 mg | ORAL_TABLET | Freq: Four times a day (QID) | ORAL | Status: DC | PRN
  Administered 2020-09-30: 650 mg via ORAL
  Filled 2020-09-30: qty 2

## 2020-09-30 MED ORDER — ONDANSETRON HCL 4 MG PO TABS: 4.0000 mg | ORAL_TABLET | Freq: Four times a day (QID) | ORAL | Status: DC | PRN

## 2020-09-30 MED ORDER — OXYCODONE-ACETAMINOPHEN 5-325 MG PO TABS
2.0000 | ORAL_TABLET | Freq: Once | ORAL | Status: AC
  Administered 2020-09-30: 2 via ORAL
  Filled 2020-09-30: qty 2

## 2020-09-30 MED ORDER — ASPIRIN EC 81 MG PO TBEC
81.0000 mg | DELAYED_RELEASE_TABLET | Freq: Every day | ORAL | Status: DC
  Administered 2020-09-30 – 2020-10-02 (×3): 81 mg via ORAL
  Filled 2020-09-30 (×3): qty 1

## 2020-09-30 MED ORDER — ACETAMINOPHEN 650 MG RE SUPP: 650.0000 mg | Freq: Four times a day (QID) | RECTAL | Status: DC | PRN

## 2020-09-30 MED ORDER — SIMVASTATIN 20 MG PO TABS: 20.0000 mg | ORAL_TABLET | Freq: Every day | ORAL | Status: DC

## 2020-09-30 NOTE — ED Provider Notes (Signed)
[facility] EMERGENCY DEPARTMENT Provider Note   CSN: [id number] Arrival date & time: 09/30/20  1019     History Chief Complaint  Patient presents with  . Wound Check    Aaron [name] is a 64 y.o. male.  HPI Patient is a 64 year old male with a history of fatty liver, chronic back pain, prediabetes, HTN, HLD, smoking.  Patient is presented today for wound check after he was bit on the left hand by a dog 11/5.  He was up-to-date on his tetanus vaccine 2 days ago when the injury occurred. He had an x-ray that was negative for any foreign body or any bony involvement and was discharged on Augmentin which he states that he has been taking as prescribed.  He states he presented to the emergency department today because of increased swelling in his hand as well as his wife telling him that his hand was warm.  He states it does appear somewhat red and it feels tight.  He states that he chronically has issues with abducting his left thumb due to an injury as a child however he states it is difficult to make a fist which she is normally able to do--he attributes this to the swelling.  He denies any focal weakness in his hand or fingers but states he feels overall like his hand is too swollen to move appropriately.  He denies any fevers, chills, nausea, vomiting, chest pain, shortness of breath, lightheadedness, dizziness, weakness or malaise.      Past Medical History:  Diagnosis Date  . Abdominal aortic aneurysm [facility])    sees Dr. Einar Gip for cardiac f/u, [phone]  . Arthritis    lower back  . Asthma    followed by Dr. Berdie Ogren  . Chronic airway obstruction, not elsewhere classified   . Dyspnea    normal PFT April 2012  . External hemorrhoids without mention of complication   . GERD (gastroesophageal reflux disease)   . Gout   . H/O hiatal hernia   . Hemorrhoids   . HTN (hypertension)    hx of sees Dr. Graylin Shiver  . Hyperlipidemia   . Insomnia, unspecified   . Other  abnormal blood chemistry   . Other dyspnea and respiratory abnormality   . Other malaise and fatigue   . Other specified erythematous condition(695.89)   . Other testicular dysfunction   . Prostatitis, unspecified   . Rash 2011   left chest, biopsy 2012 Dr. Rozann Lesches, results pending  . Skin cancer   . Tobacco abuse     Patient Active Problem List   Diagnosis Date Noted  . Dog bite 09/30/2020  . Thrombosed external hemorrhoid 12/24/2016  . Uncomplicated asthma 46/65/9935  . High risk medication use 12/24/2016  . Prostate cancer screening 12/24/2016  . Diarrhea 09/17/2016  . Other emphysema ([place]) 04/15/2015  . Erectile dysfunction 12/12/2014  . Fatty liver 04/04/2014  . Pulmonary nodules 04/04/2014  . Chronic back pain 12/06/2013  . Gout 12/06/2013  . Routine general medical examination at a health care facility 12/06/2013  . GERD (gastroesophageal reflux disease) 12/06/2013  . Tongue ulcer 12/06/2013  . AAA (abdominal aortic aneurysm) without rupture ([place]) 11/01/2013  . Depression 06/29/2013  . Musculoskeletal pain 06/07/2013  . Lung nodule, multiple 05/26/2013  . Chronic cough 03/21/2013  . Gouty arthropathy 02/23/2013  . Prediabetes 02/23/2013  . Need for prophylactic vaccination and inoculation against influenza 09/21/2012  . Pre-operative respiratory examination 09/21/2012  . Asthma, moderate persistent 03/05/2011  . HYPERLIPIDEMIA 01/20/2011  .  Hx of smoking 01/20/2011  . Essential hypertension 01/20/2011    Past Surgical History:  Procedure Laterality Date  . ABDOMINAL AORTIC ENDOVASCULAR STENT GRAFT  09/22/2012   Procedure: ABDOMINAL AORTIC ENDOVASCULAR STENT GRAFT;  Surgeon: Mal Misty, MD;  Location: [place];  Service: Vascular;  Laterality: N/A;  GORE; ultrasound guided.  . APPENDECTOMY    . EVALUATION UNDER ANESTHESIA WITH HEMORRHOIDECTOMY N/A 12/31/2016   Procedure: EXAM UNDER ANESTHESIA WITH HEMORRHOIDECTOMY;  Surgeon: [name], MD;  Location:  [place];  Service: General;  Laterality: N/A;  . SKIN CANCER EXCISION    . TOE SURGERY  01/2012   joint of left great toe  . TONSILLECTOMY    . TONSILLECTOMY    . TYMPANOPLASTY    . WRIST SURGERY     left       Family History  Problem Relation Age of Onset  . Heart attack Father 29  . COPD Father   . Heart disease Father   . Asthma Daughter   . Cancer Mother        Breast  . Lupus Daughter   . Diabetes Other        Grandson   . Colon cancer Neg Hx   . Pancreatic cancer Neg Hx   . Esophageal cancer Neg Hx   . Stomach cancer Neg Hx   . Rectal cancer Neg Hx     Social History   Tobacco Use  . Smoking status: Current Every Day Smoker    Packs/day: 0.25    Years: 38.00    Pack years: 9.50    Types: Cigarettes    Last attempt to quit: 09/02/2012    Years since quitting: 8.0  . Smokeless tobacco: Former Systems developer    Types: Snuff  Vaping Use  . Vaping Use: Never used  Substance Use Topics  . Alcohol use: Yes    Alcohol/week: 0.0 standard drinks    Comment: occ  . Drug use: No    Home Medications Prior to Admission medications   Medication Sig Start Date End Date Taking? Authorizing Provider  albuterol (PROVENTIL HFA;VENTOLIN HFA) 108 (90 Base) MCG/ACT inhaler Inhale 2 puffs into the lungs every 6 (six) hours as needed. For cough and wheezing. 08/30/18  Yes [name], NP  ALPRAZolam (XANAX) 0.25 MG tablet TAKE 1 TABLET BY MOUTH ONCE A DAY AS NEEDED FOR ANXIETY Patient taking differently: Take 0.25 mg by mouth daily as needed.  08/24/20  Yes [name], NP  amoxicillin-clavulanate (AUGMENTIN) 875-125 MG tablet Take 1 tablet by mouth every 12 (twelve) hours. 09/29/20  Yes Rolland Porter, MD  aspirin EC 81 MG tablet Take 81 mg by mouth daily.   Yes [provider]  BREO ELLIPTA 200-25 MCG/INH AEPB INHALE 1 PUFF INTO LUNGS ONCE DAILY. Patient taking differently: Inhale 1 puff into the lungs daily.  09/26/20  Yes [name], NP   losartan (COZAAR) 50 MG tablet TAKE 1 TABLET BY MOUTH ONCE DAILY TO CONTROL BLOOD PRESSURE DX I10 06/04/20  Yes [name], NP  pantoprazole (PROTONIX) 40 MG tablet Take 1 tablet (40 mg total) by mouth daily. 06/19/20  Yes [name], NP  sildenafil (VIAGRA) 50 MG tablet TAKE 1 TABLET BY MOUTH AS NEEDED FOR ERECTILE DYSFUNCTION Patient taking differently: Take 50 mg by mouth as needed.  09/26/20  Yes [name], NP  simvastatin (ZOCOR) 20 MG tablet Take one tablet by mouth once daily at bedtime. 06/21/20  Yes [name], NP  buPROPion (WELLBUTRIN XL) 150 MG 24 hr tablet Take one tablet by mouth once daily for 3 days, then Increase to One by mouth twice daily. 09/26/20   Ngetich, Dinah C, NP  traMADol (ULTRAM) 50 MG tablet Take 2 tablets (100 mg total) by mouth every 6 (six) hours as needed. 09/29/20   Rolland Porter, MD    Allergies    Patient has no known allergies.  Review of Systems   Review of Systems  Constitutional: Negative for chills and fever.  HENT: Negative for congestion.   Respiratory: Negative for shortness of breath.   Cardiovascular: Negative for chest pain.  Gastrointestinal: Negative for abdominal pain.  Musculoskeletal: Negative for neck pain.  Skin: Positive for wound.       Worsening wound pain and redness/swelling     Physical Exam Updated Vital Signs BP 134/83   Pulse 80   Temp 97.8 F (36.6 C)   Resp 19   SpO2 92%   Physical Exam Vitals and nursing note reviewed.  Constitutional:      General: He is not in acute distress.    Appearance: Normal appearance. He is not ill-appearing.  HENT:     Head: Normocephalic and atraumatic.  Eyes:     General: No scleral icterus.       Right eye: No discharge.        Left eye: No discharge.     Conjunctiva/sclera: Conjunctivae normal.  Pulmonary:     Effort: Pulmonary effort is normal.     Breath sounds: No stridor.  Musculoskeletal:     Comments: Able to flex and extend all fingers  of left hand.  Skin:    General: Skin is warm and dry.     Findings: Erythema present.     Comments: There is erythema and warmth around puncture wounds of the left anterior thigh see picture below.  There is also warmth swelling and redness and tenderness-of the left dorsum of the hand around puncture site see pictures below.  Neurological:     Mental Status: He is alert and oriented to person, place, and time. Mental status is at baseline.              ED Results / Procedures / Treatments   Labs (all labs ordered are listed, but only abnormal results are displayed) Labs Reviewed  COMPREHENSIVE METABOLIC PANEL - Abnormal; Notable for the following components:      Result Value   Sodium 133 (*)    Calcium 8.5 (*)    All other components within normal limits  CULTURE, BLOOD (SINGLE)  RESPIRATORY PANEL BY RT PCR (FLU A&B, COVID)  CBC WITH DIFFERENTIAL/PLATELET  HIV ANTIBODY (ROUTINE TESTING W REFLEX)  COMPREHENSIVE METABOLIC PANEL  CBC    EKG None  Radiology DG Hand Complete Left  Result Date: 09/30/2020 CLINICAL DATA:  Dog bite, wound check, swelling EXAM: LEFT HAND - COMPLETE 3+ VIEW COMPARISON:  09/29/2020 FINDINGS: Osseous mineralization normal. Mild degenerative changes at first MCP joint. Remaining joint spaces preserved. Non fused ossicle at ulnar styloid. No acute fracture, dislocation, or bone destruction. Increased soft tissue swelling of LEFT hand and wrist since prior study. IMPRESSION: Increased soft tissue swelling. No acute osseous abnormalities. Degenerative changes LEFT first MCP joint. Electronically Signed   By: Lavonia Dana M.D.   On: 09/30/2020 11:40   DG Hand Complete Left  Result Date: 09/29/2020 CLINICAL DATA:  Dog bite left thumb EXAM: LEFT HAND - COMPLETE  3+ VIEW COMPARISON:  None. FINDINGS: There is no evidence of fracture or dislocation. There is no evidence of arthropathy or other focal bone abnormality. Soft tissues are unremarkable. No  radiopaque foreign body or soft tissue gas. IMPRESSION: No acute bony abnormality. Electronically Signed   By: Rolm Baptise M.D.   On: 09/29/2020 01:47    Procedures Procedures (including critical care time)  Medications Ordered in ED Medications  Ampicillin-Sulbactam (UNASYN) 3 g in sodium chloride 0.9 % 100 mL IVPB (0 g Intravenous Stopped 09/30/20 1448)  nicotine (NICODERM CQ - dosed in mg/24 hours) patch 21 mg (21 mg Transdermal Refused 09/30/20 1411)  acetaminophen (TYLENOL) tablet 650 mg (has no administration in time range)    Or  acetaminophen (TYLENOL) suppository 650 mg (has no administration in time range)  oxyCODONE (Oxy IR/ROXICODONE) immediate release tablet 5 mg (5 mg Oral Given 09/30/20 1819)  ondansetron (ZOFRAN) tablet 4 mg (has no administration in time range)    Or  ondansetron (ZOFRAN) injection 4 mg (has no administration in time range)  aspirin EC tablet 81 mg (81 mg Oral Given 09/30/20 1823)  losartan (COZAAR) tablet 50 mg (has no administration in time range)  simvastatin (ZOCOR) tablet 20 mg (has no administration in time range)  ALPRAZolam (XANAX) tablet 0.25 mg (has no administration in time range)  pantoprazole (PROTONIX) EC tablet 40 mg (40 mg Oral Not Given 09/30/20 1820)  albuterol (VENTOLIN HFA) 108 (90 Base) MCG/ACT inhaler 2 puff (has no administration in time range)  fluticasone furoate-vilanterol (BREO ELLIPTA) 200-25 MCG/INH 1 puff (has no administration in time range)  oxyCODONE-acetaminophen (PERCOCET/ROXICET) 5-325 MG per tablet 2 tablet (2 tablets Oral Given 09/30/20 1101)    ED Course  I have reviewed the triage vital signs and the nursing notes.  Pertinent labs & imaging results that were available during my care of the patient were reviewed by me and considered in my medical decision making (see chart for details).  Patient is a 64 year old male with past medical history detailed in HPI presented today for left hand swelling and redness and pain  and left thigh redness and swelling and pain after dog bite that occurred 11/5.  He was started on Augmentin and has taken several doses of this however symptoms seem to have rapidly worsened.  [place]innie Level is notable for redness and swelling of the left anterior thigh around the puncture wounds.  He also has redness and swelling of the left hand over the dorsum more significantly that tracks to the proximal wrist.  He is afebrile nontachycardic and is not meeting SIRS criteria.  His CBC is without leukocytosis or anemia.  CMP is without any abnormality of significance.  Single blood culture was obtained by nursing staff prior to antibiotic.  Covid test is negative.  I discussed this case with my attending physician who cosigned this note including patient's presenting symptoms, physical exam, and planned diagnostics and interventions. Attending physician stated agreement with plan or made changes to plan which were implemented.   Discussed with Larena Glassman of hand surgery who states that he is on call tomorrow.  Recommends hospitalization with IV antibiotics and hand consultation tomorrow.  Clinical Course as of Sep 30 1924  Nancy Fetter Sep 30, 2020  1505 Discussed with hospitalist Dr. Cruzita Lederer who will admit for IV abx and consultation with hand surgery tomorrow.    [WF]    Clinical Course User Index [WF] Tedd Sias, PA   MDM Rules/Calculators/A&P  Final Clinical Impression(s) / ED Diagnoses Final diagnoses:  Dog bite    Rx / DC Orders ED Discharge Orders    None       Tedd Sias, [place] 09/30/20 1926    Margette Fast, MD 10/01/20 1521

## 2020-09-30 NOTE — H&P (Signed)
History and Physical    Aaron [name] RJJ:[id number] DOB: 11/21/1956 DOA: 09/30/2020  I have briefly reviewed the patient's prior medical records in [place]  PCP: [name], NP  Patient coming from: home   Chief Complaint: hand swelling  HPI: Aaron [name] is a 64 y.o. male with medical history significant of hypertension, hyperlipidemia, asthma, tobacco use, comes into the hospital with complaints of left hand swelling.  Patient tells me that he was bitten by his dog about 2 days ago on Friday evening.  He was bitten by his left hand as well as left thigh.  He noticed that his left hand has become progressively swollen.  He was seen in the ER yesterday, was given Augmentin and discharged home.  He took couple doses of Augmentin but despite that his hands seem to be getting worse, he had increased pain and swelling and decided to come back to the ER.  He denies any fever or chills.  No chest pain, no shortness of breath.  No abdominal pain, no nausea or vomiting.  Reports that he owns the dog and is up-to-date with all vaccinations.  ED Course: In the ED is afebrile 97.8, normotensive, satting well on room air.  His blood work is fairly unremarkable.  Covid negative.  Hand x-ray with swelling but no acute osseous abnormalities.  EDP discussed with hand on-call and recommended admission for IV antibiotics.  Review of Systems: All systems reviewed, and apart from HPI, all negative  Past Medical History:  Diagnosis Date  . Abdominal aortic aneurysm [facility])    sees Dr. Einar Gip for cardiac f/u, [phone]  . Arthritis    lower back  . Asthma    followed by Dr. Berdie Ogren  . Chronic airway obstruction, not elsewhere classified   . Dyspnea    normal PFT April 2012  . External hemorrhoids without mention of complication   . GERD (gastroesophageal reflux disease)   . Gout   . H/O hiatal hernia   . Hemorrhoids   . HTN (hypertension)    hx of sees Dr. Graylin Shiver  .  Hyperlipidemia   . Insomnia, unspecified   . Other abnormal blood chemistry   . Other dyspnea and respiratory abnormality   . Other malaise and fatigue   . Other specified erythematous condition(695.89)   . Other testicular dysfunction   . Prostatitis, unspecified   . Rash 2011   left chest, biopsy 2012 Dr. Rozann Lesches, results pending  . Skin cancer   . Tobacco abuse     Past Surgical History:  Procedure Laterality Date  . ABDOMINAL AORTIC ENDOVASCULAR STENT GRAFT  09/22/2012   Procedure: ABDOMINAL AORTIC ENDOVASCULAR STENT GRAFT;  Surgeon: Mal Misty, MD;  Location: [facility];  Service: Vascular;  Laterality: N/A;  GORE; ultrasound guided.  . APPENDECTOMY    . EVALUATION UNDER ANESTHESIA WITH HEMORRHOIDECTOMY N/A 12/31/2016   Procedure: EXAM UNDER ANESTHESIA WITH HEMORRHOIDECTOMY;  Surgeon: [name], MD;  Location: [facility];  Service: General;  Laterality: N/A;  . SKIN CANCER EXCISION    . TOE SURGERY  01/2012   joint of left great toe  . TONSILLECTOMY    . TONSILLECTOMY    . TYMPANOPLASTY    . WRIST SURGERY     left     reports that he has been smoking cigarettes. He has a 9.50 pack-year smoking history. He has quit using smokeless tobacco.  His smokeless tobacco use included snuff. He reports current alcohol use. He reports  that he does not use drugs.  No Known Allergies  Family History  Problem Relation Age of Onset  . Heart attack Father 80  . COPD Father   . Heart disease Father   . Asthma Daughter   . Cancer Mother        Breast  . Lupus Daughter   . Diabetes Other        Grandson   . Colon cancer Neg Hx   . Pancreatic cancer Neg Hx   . Esophageal cancer Neg Hx   . Stomach cancer Neg Hx   . Rectal cancer Neg Hx     Prior to Admission medications   Medication Sig Start Date End Date Taking? Authorizing Provider  albuterol (PROVENTIL HFA;VENTOLIN HFA) 108 (90 Base) MCG/ACT inhaler Inhale 2 puffs into the lungs every 6 (six) hours as  needed. For cough and wheezing. 08/30/18  Yes [name], NP  ALPRAZolam (XANAX) 0.25 MG tablet TAKE 1 TABLET BY MOUTH ONCE A DAY AS NEEDED FOR ANXIETY Patient taking differently: Take 0.25 mg by mouth daily as needed.  08/24/20  Yes [name], NP  amoxicillin-clavulanate (AUGMENTIN) 875-125 MG tablet Take 1 tablet by mouth every 12 (twelve) hours. 09/29/20  Yes Rolland Porter, MD  aspirin EC 81 MG tablet Take 81 mg by mouth daily.   Yes [provider]  BREO ELLIPTA 200-25 MCG/INH AEPB INHALE 1 PUFF INTO LUNGS ONCE DAILY. Patient taking differently: Inhale 1 puff into the lungs daily.  09/26/20  Yes [name], NP  losartan (COZAAR) 50 MG tablet TAKE 1 TABLET BY MOUTH ONCE DAILY TO CONTROL BLOOD PRESSURE DX I10 06/04/20  Yes [name], NP  pantoprazole (PROTONIX) 40 MG tablet Take 1 tablet (40 mg total) by mouth daily. 06/19/20  Yes [name], NP  sildenafil (VIAGRA) 50 MG tablet TAKE 1 TABLET BY MOUTH AS NEEDED FOR ERECTILE DYSFUNCTION Patient taking differently: Take 50 mg by mouth as needed.  09/26/20  Yes [name], NP  simvastatin (ZOCOR) 20 MG tablet Take one tablet by mouth once daily at bedtime. 06/21/20  Yes [name], NP  buPROPion (WELLBUTRIN XL) 150 MG 24 hr tablet Take one tablet by mouth once daily for 3 days, then Increase to One by mouth twice daily. 09/26/20   Ngetich, Dinah C, NP  traMADol (ULTRAM) 50 MG tablet Take 2 tablets (100 mg total) by mouth every 6 (six) hours as needed. 09/29/20   Rolland Porter, MD    Physical Exam: Vitals:   09/30/20 1300 09/30/20 1330 09/30/20 1430 09/30/20 1500  BP: 123/88 (!) 144/90 118/80 (!) 121/91  Pulse: 62 61 (!) 59 (!) 58  Resp: 17 14 18 17   Temp:      SpO2: 97% 96% 94% 93%        Constitutional: NAD, calm, comfortable Eyes: PERRL, lids and conjunctivae normal ENMT: Mucous membranes are moist. Posterior pharynx clear of any exudate or lesions.Normal dentition.  Neck: normal,  supple Respiratory: clear to auscultation bilaterally, no wheezing, no crackles. Normal respiratory effort. No accessory muscle use.  Cardiovascular: Regular rate and rhythm, no murmurs / rubs / gallops. No extremity edema Abdomen: no tenderness, no masses palpated. Bowel sounds positive.  Musculoskeletal: no clubbing / cyanosis. Normal muscle tone.  Skin: No other rashes other than the pictures above Neurologic: Nonfocal  Labs on Admission: I have personally reviewed following labs and imaging studies  CBC: Recent Labs  Lab 09/30/20 1406  WBC 8.8  NEUTROABS 5.7  HGB 14.7  HCT 43.1  MCV 93.7  PLT 761   Basic Metabolic Panel: Recent Labs  Lab 09/30/20 1406  NA 133*  K 3.5  CL 101  CO2 24  GLUCOSE 99  BUN 8  CREATININE 0.80  CALCIUM 8.5*   Liver Function Tests: Recent Labs  Lab 09/30/20 1406  AST 36  ALT 37  ALKPHOS 68  BILITOT 0.8  PROT 7.3  ALBUMIN 4.0   Coagulation Profile: No results for input(s): INR, PROTIME in the last 168 hours. BNP (last 3 results) No results for input(s): PROBNP in the last 8760 hours. CBG: No results for input(s): GLUCAP in the last 168 hours. Thyroid Function Tests: No results for input(s): TSH, T4TOTAL, FREET4, T3FREE, THYROIDAB in the last 72 hours. Urine analysis:    Component Value Date/Time   COLORURINE YELLOW 08/12/2019 1927   APPEARANCEUR CLEAR 08/12/2019 1927   LABSPEC 1.015 08/12/2019 1927   PHURINE 6.5 08/12/2019 1927   GLUCOSEU NEGATIVE 08/12/2019 1927   HGBUR NEGATIVE 08/12/2019 1927   BILIRUBINUR NEGATIVE 08/12/2019 1927   KETONESUR NEGATIVE 08/12/2019 1927   PROTEINUR NEGATIVE 08/12/2019 1927   UROBILINOGEN 0.2 09/14/2012 1609   NITRITE NEGATIVE 08/12/2019 1927   LEUKOCYTESUR NEGATIVE 08/12/2019 1927     Radiological Exams on Admission: DG Hand Complete Left  Result Date: 09/30/2020 CLINICAL DATA:  Dog bite, wound check, swelling EXAM: LEFT HAND - COMPLETE 3+ VIEW COMPARISON:  09/29/2020 FINDINGS:  Osseous mineralization normal. Mild degenerative changes at first MCP joint. Remaining joint spaces preserved. Non fused ossicle at ulnar styloid. No acute fracture, dislocation, or bone destruction. Increased soft tissue swelling of LEFT hand and wrist since prior study. IMPRESSION: Increased soft tissue swelling. No acute osseous abnormalities. Degenerative changes LEFT first MCP joint. Electronically Signed   By: Lavonia Dana M.D.   On: 09/30/2020 11:40   DG Hand Complete Left  Result Date: 09/29/2020 CLINICAL DATA:  Dog bite left thumb EXAM: LEFT HAND - COMPLETE 3+ VIEW COMPARISON:  None. FINDINGS: There is no evidence of fracture or dislocation. There is no evidence of arthropathy or other focal bone abnormality. Soft tissues are unremarkable. No radiopaque foreign body or soft tissue gas. IMPRESSION: No acute bony abnormality. Electronically Signed   By: Rolm Baptise M.D.   On: 09/29/2020 01:47    Assessment/Plan  Principal Problem Hand infection, due to dog bite, thigh infection -Patient will be admitted to the hospital, placed on Unasyn.  We will obtain an MRI of the left hand as well as an ultrasound of the left thigh to rule out any deep abscesses or need for surgical intervention -He is afebrile and nontoxic-appearing  Active Problems Essential hypertension -Continue home losartan  Hyperlipidemia -Continue statin  History of asthma /emphysema -Follows with pulmonology as an outpatient, continue home medications  Tobacco use -Counseled for cessation  Alcohol use -Reports 3 drinks a day, denies withdrawal symptoms in the past.  Monitor for now.   DVT prophylaxis: SCDs Code Status: Full code Family Communication: Wife at bedside Disposition Plan: Home when ready Bed Type: MedSurg Consults called: Hand surgery by EDP, Dr Amedeo Kinsman Obs/Inp: Observation  Marzetta Board, MD, PhD [facility]  Contact via [email or web address]  09/30/2020, 3:57 PM

## 2020-09-30 NOTE — ED Notes (Signed)
C/o pain under right rib.

## 2020-09-30 NOTE — ED Notes (Signed)
Report received 

## 2020-09-30 NOTE — ED Notes (Signed)
Hospitalist at bedside 

## 2020-09-30 NOTE — ED Triage Notes (Signed)
Wound recheck. Left hand swollen

## 2020-09-30 NOTE — ED Notes (Signed)
Bitten by his dog  On Friday   Also L leg   Redness and swelling to hand unchanged by home augmentin  Admit hold for antibiotics

## 2020-09-30 NOTE — ED Notes (Signed)
Report to Ms Caron Presume, RN  To floor

## 2020-09-30 NOTE — ED Notes (Signed)
Redness noted to left hand and left thigh.

## 2020-09-30 NOTE — ED Notes (Signed)
Portable xray to left hand done.

## 2020-09-30 NOTE — Progress Notes (Signed)
Pharmacy Antibiotic Note  Afshin Chrystal is a 64 y.o. male admitted on 09/30/2020 with infected dog bite.  Pharmacy has been consulted to assist in   antibiotic dosing in this regard.  Patient failed outpatient Augmentin, but only took a few doses.    Plan: Start Unasyn 3g IV q6h Pharmacy will continue to monitor labs, cultures and patient progress.   Temp (24hrs), Avg:97.8 F (36.6 C), Min:97.8 F (36.6 C), Max:97.8 F (36.6 C)  No results for input(s): WBC, CREATININE, LATICACIDVEN, VANCOTROUGH, VANCOPEAK, VANCORANDOM, GENTTROUGH, GENTPEAK, GENTRANDOM, TOBRATROUGH, TOBRAPEAK, TOBRARND, AMIKACINPEAK, AMIKACINTROU, AMIKACIN in the last 168 hours.  Estimated Creatinine Clearance: 100.5 mL/min (by C-G formula based on SCr of 0.99 mg/dL).    No Known Allergies  Antimicrobials this admission: Unasyn 11/7>>    Microbiology results:   BCx:     UCx:      Sputum:    MRSA PCR:    Thank you for allowing pharmacy to be a part of this patient's care.  Despina Pole 09/30/2020 1:45 PM

## 2020-10-01 ENCOUNTER — Inpatient Hospital Stay (HOSPITAL_COMMUNITY): Payer: 59

## 2020-10-01 ENCOUNTER — Other Ambulatory Visit: Payer: Self-pay

## 2020-10-01 ENCOUNTER — Observation Stay (HOSPITAL_COMMUNITY): Payer: 59

## 2020-10-01 DIAGNOSIS — L02519 Cutaneous abscess of unspecified hand: Secondary | ICD-10-CM

## 2020-10-01 DIAGNOSIS — Z7982 Long term (current) use of aspirin: Secondary | ICD-10-CM | POA: Diagnosis not present

## 2020-10-01 DIAGNOSIS — M7989 Other specified soft tissue disorders: Secondary | ICD-10-CM | POA: Diagnosis not present

## 2020-10-01 DIAGNOSIS — M19042 Primary osteoarthritis, left hand: Secondary | ICD-10-CM | POA: Diagnosis not present

## 2020-10-01 DIAGNOSIS — F32A Depression, unspecified: Secondary | ICD-10-CM | POA: Diagnosis present

## 2020-10-01 DIAGNOSIS — R600 Localized edema: Secondary | ICD-10-CM | POA: Diagnosis not present

## 2020-10-01 DIAGNOSIS — S71152A Open bite, left thigh, initial encounter: Secondary | ICD-10-CM | POA: Diagnosis not present

## 2020-10-01 DIAGNOSIS — S81852A Open bite, left lower leg, initial encounter: Secondary | ICD-10-CM | POA: Diagnosis not present

## 2020-10-01 DIAGNOSIS — W540XXD Bitten by dog, subsequent encounter: Secondary | ICD-10-CM | POA: Diagnosis not present

## 2020-10-01 DIAGNOSIS — S61452A Open bite of left hand, initial encounter: Secondary | ICD-10-CM | POA: Diagnosis not present

## 2020-10-01 DIAGNOSIS — J438 Other emphysema: Secondary | ICD-10-CM

## 2020-10-01 DIAGNOSIS — Z7951 Long term (current) use of inhaled steroids: Secondary | ICD-10-CM | POA: Diagnosis not present

## 2020-10-01 DIAGNOSIS — L03119 Cellulitis of unspecified part of limb: Secondary | ICD-10-CM | POA: Diagnosis not present

## 2020-10-01 DIAGNOSIS — R911 Solitary pulmonary nodule: Secondary | ICD-10-CM | POA: Diagnosis not present

## 2020-10-01 DIAGNOSIS — I10 Essential (primary) hypertension: Secondary | ICD-10-CM | POA: Diagnosis not present

## 2020-10-01 DIAGNOSIS — W540XXA Bitten by dog, initial encounter: Secondary | ICD-10-CM | POA: Diagnosis not present

## 2020-10-01 DIAGNOSIS — E785 Hyperlipidemia, unspecified: Secondary | ICD-10-CM | POA: Diagnosis present

## 2020-10-01 DIAGNOSIS — I714 Abdominal aortic aneurysm, without rupture: Secondary | ICD-10-CM | POA: Diagnosis present

## 2020-10-01 DIAGNOSIS — L03116 Cellulitis of left lower limb: Secondary | ICD-10-CM

## 2020-10-01 DIAGNOSIS — J439 Emphysema, unspecified: Secondary | ICD-10-CM | POA: Diagnosis present

## 2020-10-01 DIAGNOSIS — Z79899 Other long term (current) drug therapy: Secondary | ICD-10-CM | POA: Diagnosis not present

## 2020-10-01 DIAGNOSIS — R0789 Other chest pain: Secondary | ICD-10-CM | POA: Diagnosis present

## 2020-10-01 DIAGNOSIS — Z20822 Contact with and (suspected) exposure to covid-19: Secondary | ICD-10-CM | POA: Diagnosis present

## 2020-10-01 DIAGNOSIS — F17210 Nicotine dependence, cigarettes, uncomplicated: Secondary | ICD-10-CM | POA: Diagnosis present

## 2020-10-01 DIAGNOSIS — Z7289 Other problems related to lifestyle: Secondary | ICD-10-CM | POA: Diagnosis not present

## 2020-10-01 DIAGNOSIS — L03114 Cellulitis of left upper limb: Secondary | ICD-10-CM | POA: Diagnosis not present

## 2020-10-01 DIAGNOSIS — M60852 Other myositis, left thigh: Secondary | ICD-10-CM | POA: Diagnosis present

## 2020-10-01 DIAGNOSIS — F419 Anxiety disorder, unspecified: Secondary | ICD-10-CM | POA: Diagnosis present

## 2020-10-01 DIAGNOSIS — M60842 Other myositis, left hand: Secondary | ICD-10-CM | POA: Diagnosis present

## 2020-10-01 DIAGNOSIS — Z85828 Personal history of other malignant neoplasm of skin: Secondary | ICD-10-CM | POA: Diagnosis not present

## 2020-10-01 DIAGNOSIS — K219 Gastro-esophageal reflux disease without esophagitis: Secondary | ICD-10-CM | POA: Diagnosis present

## 2020-10-01 LAB — COMPREHENSIVE METABOLIC PANEL
ALT: 31 U/L (ref 0–44)
AST: 27 U/L (ref 15–41)
Albumin: 3.6 g/dL (ref 3.5–5.0)
Alkaline Phosphatase: 61 U/L (ref 38–126)
Anion gap: 8 (ref 5–15)
BUN: 7 mg/dL — ABNORMAL LOW (ref 8–23)
CO2: 28 mmol/L (ref 22–32)
Calcium: 8.6 mg/dL — ABNORMAL LOW (ref 8.9–10.3)
Chloride: 102 mmol/L (ref 98–111)
Creatinine, Ser: 0.79 mg/dL (ref 0.61–1.24)
GFR, Estimated: >60 mL/min (ref >60)
Glucose, Bld: 94 mg/dL (ref 70–99)
Potassium: 3.6 mmol/L (ref 3.5–5.1)
Sodium: 138 mmol/L (ref 135–145)
Total Bilirubin: 0.8 mg/dL (ref 0.3–1.2)
Total Protein: 6.6 g/dL (ref 6.5–8.1)

## 2020-10-01 LAB — CBC
HCT: 42.6 % (ref 39.0–52.0)
Hemoglobin: 14 g/dL (ref 13.0–17.0)
MCH: 31.7 pg (ref 26.0–34.0)
MCHC: 32.9 g/dL (ref 30.0–36.0)
MCV: 96.4 fL (ref 80.0–100.0)
Platelets: 241 10*3/uL (ref 150–400)
RBC: 4.42 MIL/uL (ref 4.22–5.81)
RDW: 12.1 % (ref 11.5–15.5)
WBC: 7.2 10*3/uL (ref 4.0–10.5)
nRBC: 0 % (ref 0.0–0.2)

## 2020-10-01 LAB — D-DIMER, QUANTITATIVE: D-Dimer, Quant: 0.69 ug/mL-FEU — ABNORMAL HIGH (ref 0.00–0.50)

## 2020-10-01 LAB — HIV ANTIBODY (ROUTINE TESTING W REFLEX): HIV Screen 4th Generation wRfx: NONREACTIVE

## 2020-10-01 LAB — C-REACTIVE PROTEIN: CRP: 8.5 mg/dL — ABNORMAL HIGH (ref <1.0)

## 2020-10-01 LAB — SEDIMENTATION RATE: Sed Rate: 13 mm/hr (ref 0–16)

## 2020-10-01 LAB — TROPONIN I (HIGH SENSITIVITY)
Troponin I (High Sensitivity): 7 ng/L (ref <18)
Troponin I (High Sensitivity): 8 ng/L (ref <18)

## 2020-10-01 IMAGING — MR MR [PERSON_NAME]*[PERSON_NAME]* WO/W CM
9 series · 40 of 40 positions shown · IV contrast (10 ml Gadavist)
Comparison: None.

CLINICAL DATA: Dog bite. Hand pain and swelling.

EXAM:
MRI OF THE LEFT HAND WITHOUT AND WITH CONTRAST
TECHNIQUE: Multiplanar, multisequence MR imaging of the left hand was performed
before and after the administration of intravenous contrast.
CONTRAST:  10mL GADAVIST GADOBUTROL 1 MMOL/ML IV SOLN

[Series 6: T2 fat-sat · axial · left · 4.0mm · 0.62mm/px · z∈[-156,+51]mm · 6 of 48 slices shown]
[im 1/48]
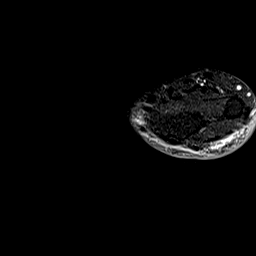
[im 10/48]
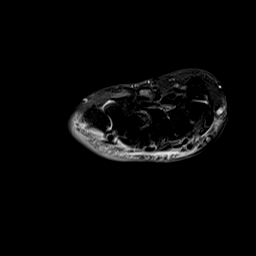
[im 19/48]
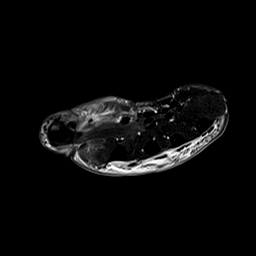
[im 29/48]
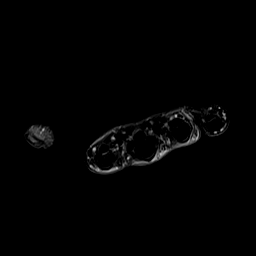
[im 38/48]
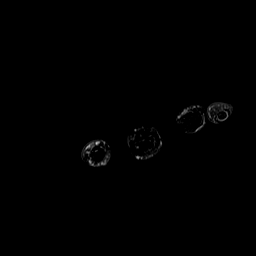
[im 48/48]
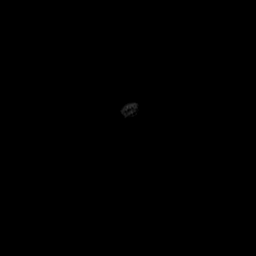

[Series 7: T1 · axial · left · 4.0mm · 0.50mm/px · z∈[-136,+54]mm · 5 of 44 slices shown (1 of 3)]
[im 1/44]
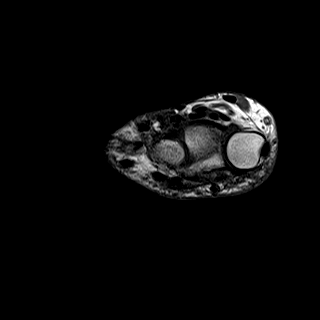
[im 11/44]
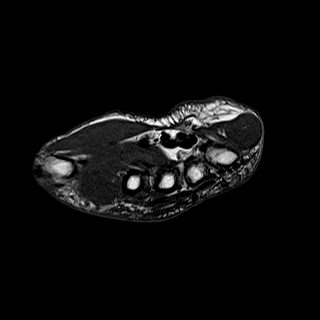
[im 22/44]
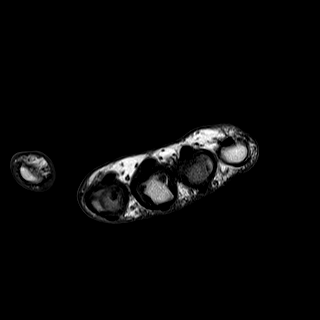
[im 33/44]
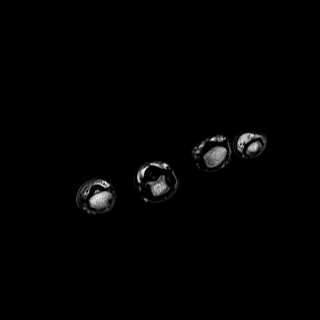
[im 44/44]
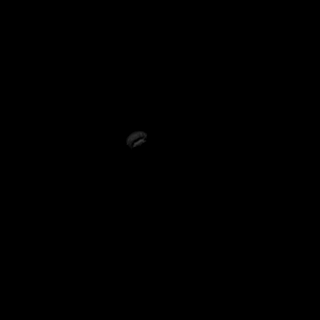

[Series 8: T1 · coronal · left · 3.0mm · 0.69mm/px · 2 of 18 slices shown (2 of 3)]
[im 1/18]
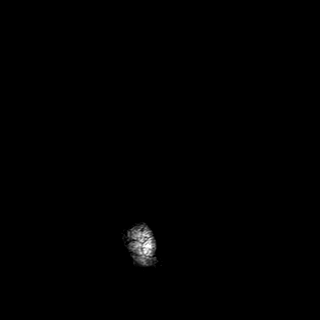
[im 18/18]
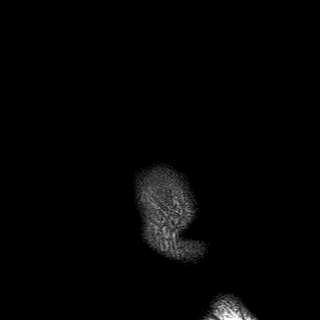

[Series 9: T1 · axial · left · 4.0mm · 0.50mm/px · z∈[-136,+54]mm · 5 of 44 slices shown (3 of 3)]
[im 1/44]
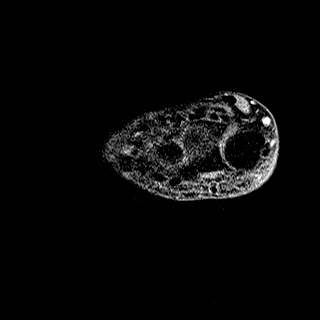
[im 11/44]
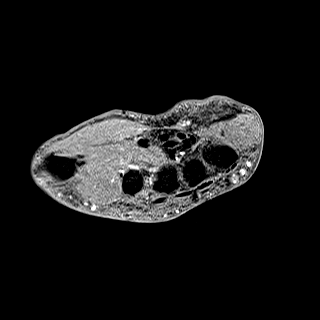
[im 22/44]
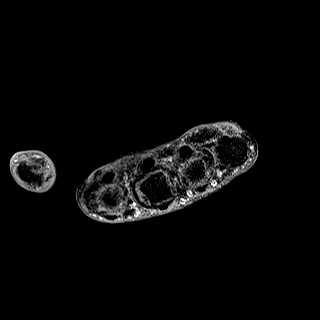
[im 33/44]
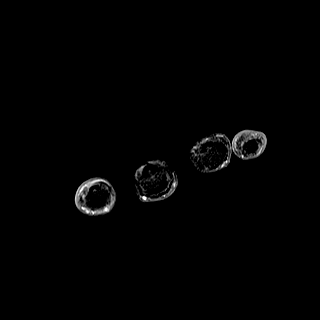
[im 44/44]
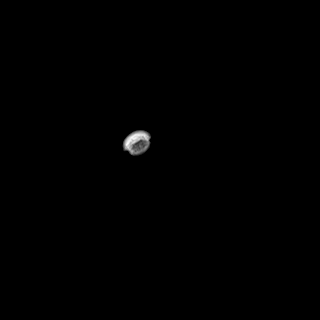

[Series 10: PD fat-sat · sagittal · left · 3.0mm · 0.69mm/px · 5 of 40 slices shown (1 of 2)]
[im 1/40]
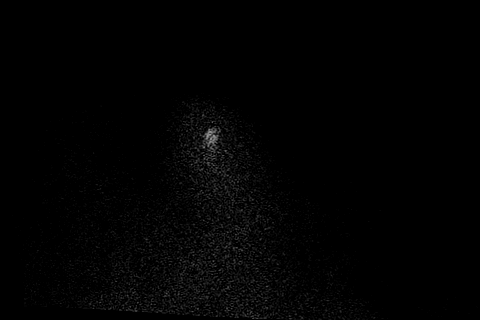
[im 10/40]
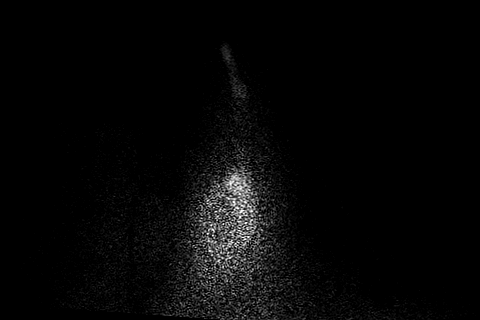
[im 20/40]
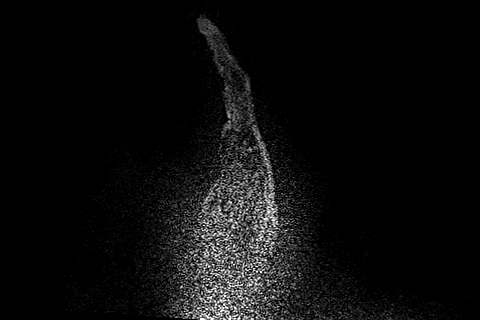
[im 30/40]
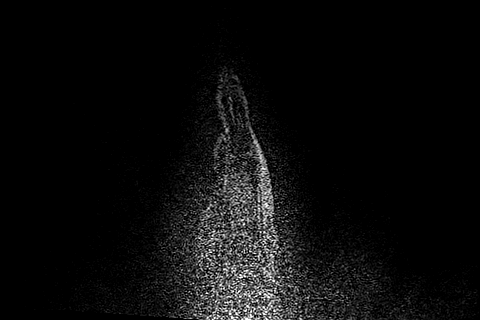
[im 40/40]
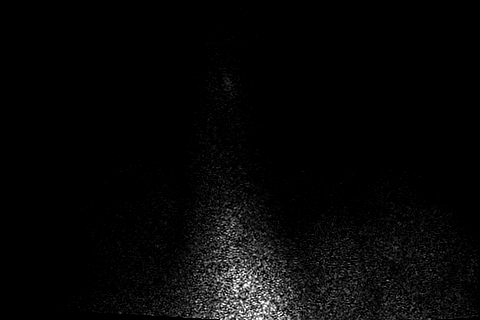

[Series 12: PD fat-sat · sagittal · left · 3.0mm · 0.69mm/px · 5 of 38 slices shown (2 of 2)]
[im 1/38]
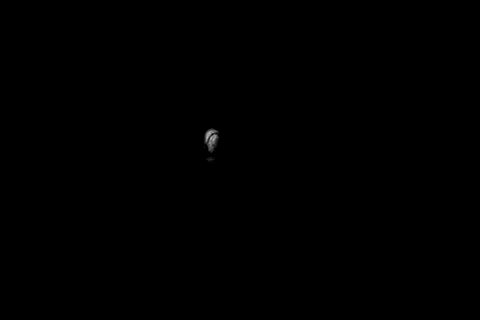
[im 10/38]
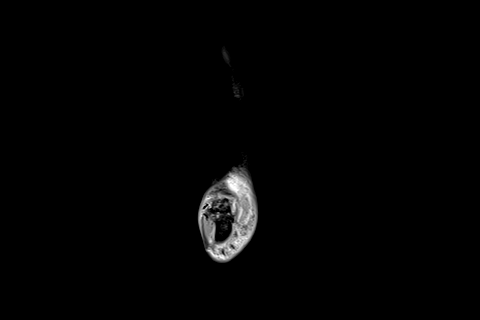
[im 19/38]
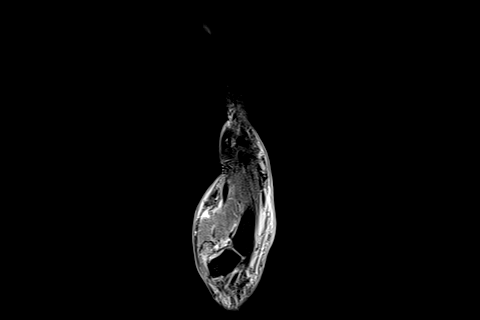
[im 28/38]
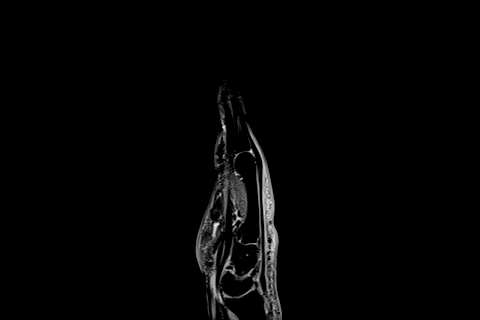
[im 38/38]
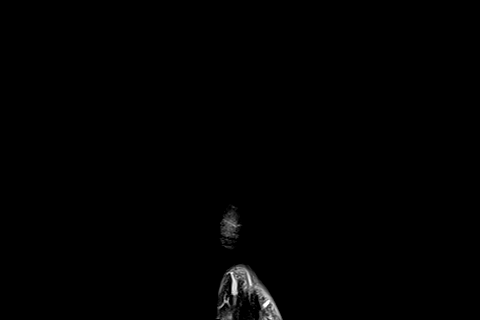

[Series 13: ax t1fs · axial · left · 4.0mm · 0.50mm/px · z∈[-136,+54]mm · 5 of 44 slices shown]
[im 1/44]
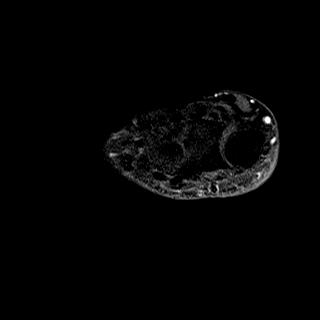
[im 11/44]
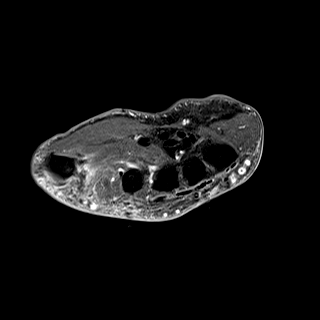
[im 22/44]
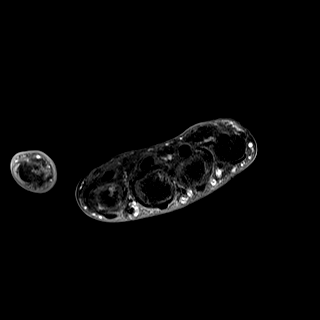
[im 33/44]
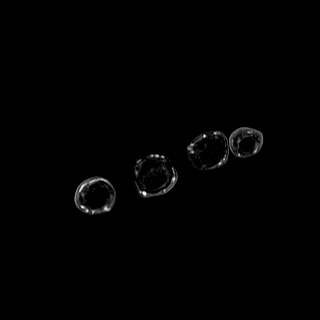
[im 44/44]
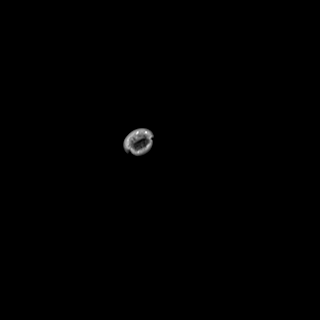

[Series 14: cor t1fs · coronal · left · 3.0mm · 0.69mm/px · 2 of 18 slices shown]
[im 1/18]
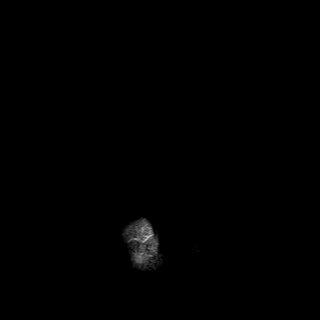
[im 18/18]
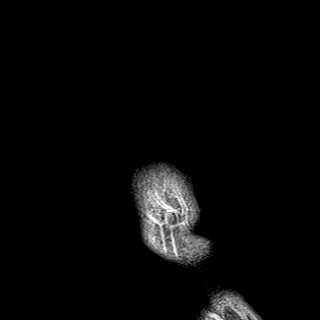

[Series 15: sag t1fs · sagittal · left · 3.0mm · 0.69mm/px · 5 of 40 slices shown]
[im 1/40]
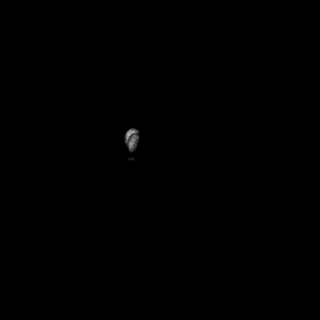
[im 10/40]
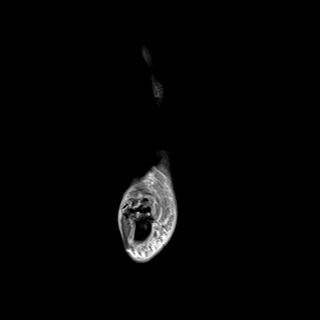
[im 20/40]
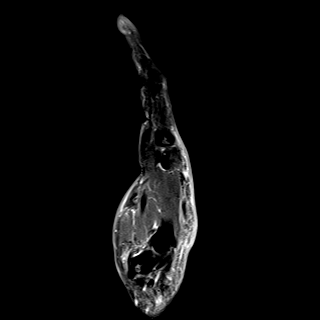
[im 30/40]
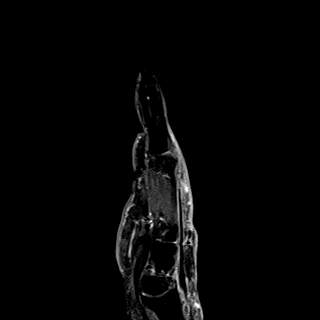
[im 40/40]
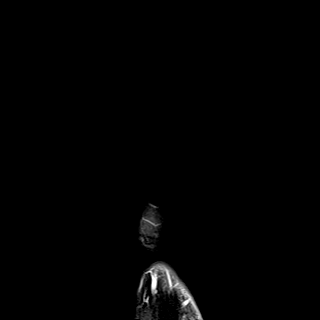

[40 of 40 positions shown; findings below may reference images not displayed]

FINDINGS: Moderate degenerative changes are noted involving the hand and
wrist. The most significant degenerative changes are at the first
MCP joint with significant joint space narrowing, cartilage loss and
subchondral cystic change. I do not see any findings suspicious for
septic arthritis.

There is a small open wound noted near the base of the thumb along
the dorsum of the hand. There may be a small amount of air in the
wound. There is moderate cellulitis with diffuse subcutaneous soft
tissue swelling/edema/fluid and enhancement all along the dorsum of
the hand. There is also significant myositis mainly involving the
first dorsal interosseous muscle. No findings for pyomyositis. No
findings to suggest osteomyelitis. No discrete drainable soft tissue
abscess.
IMPRESSION: 1. Small open wound near the base of the thumb along the dorsum of
the hand. There may be a small amount of air in the wound.
2. Moderate diffuse cellulitis involving the dorsum of the hand. No
discrete drainable soft tissue abscess.
3. Myositis involving the first dorsal interosseous muscle without
evidence of pyomyositis.
4.  findings for septic arthritis or osteomyelitis.
5. Moderate degenerative changes involving the hand and wrist.

## 2020-10-01 IMAGING — US US EXTREM LOW*L* LIMITED
1 series · 14 of 25 positions shown · non-contrast
Comparison: None.

CLINICAL DATA: Recent dog bites with possible abscess

EXAM:
ULTRASOUND LEFT LOWER EXTREMITY LIMITED
TECHNIQUE: Ultrasound examination of the lower extremity soft tissues was
performed in the area of clinical concern.

[Series 1: us extrem low*left* limited · 0.04mm/px · 36 acquisitions, 14 frames shown]
[im 1/36]
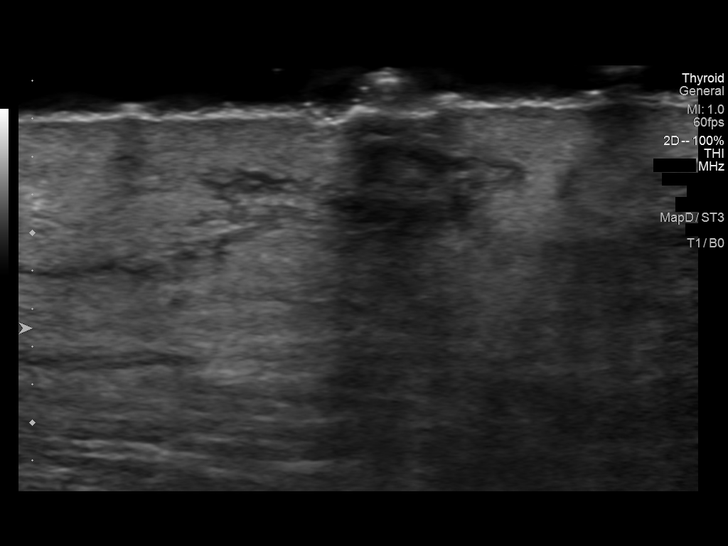
[im 3/36]
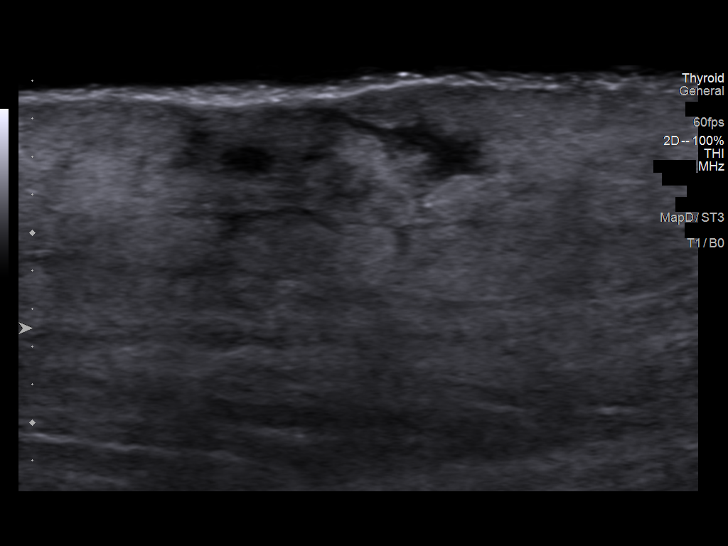
[im 6/36]
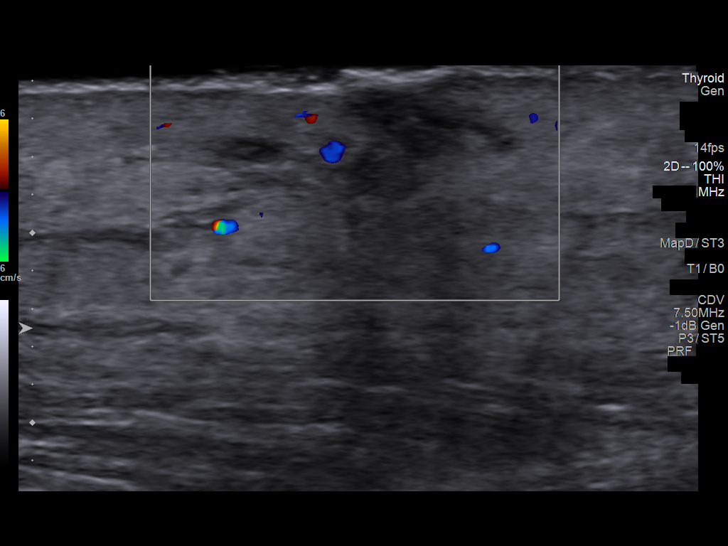
[im 9/36]
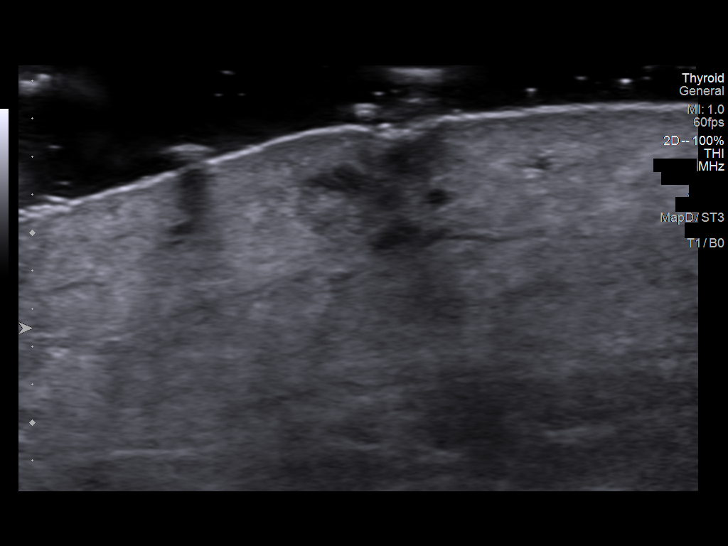
[im 12/36]
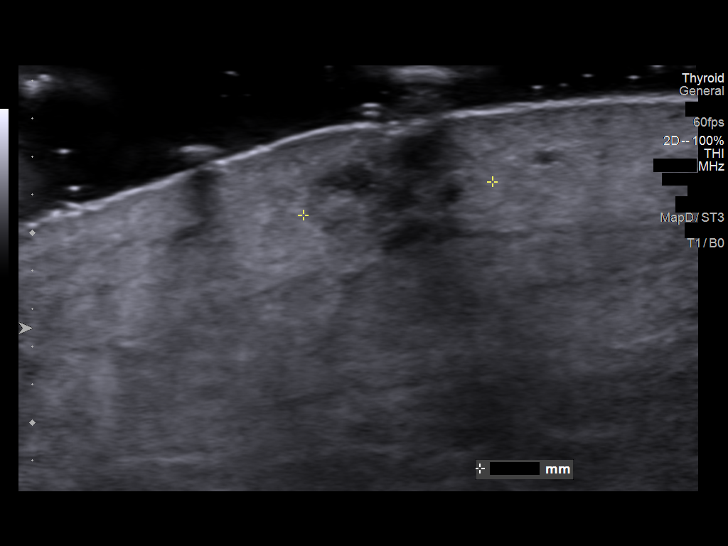
[im 14/36]
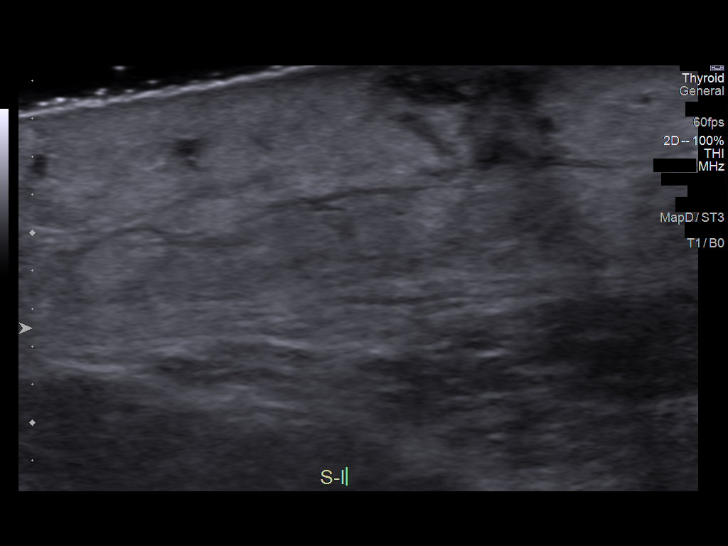
[im 17/36]
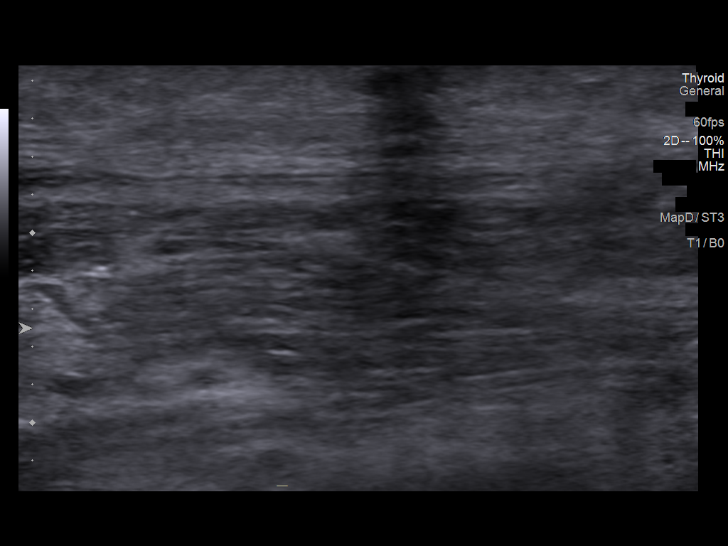
[im 19/36]
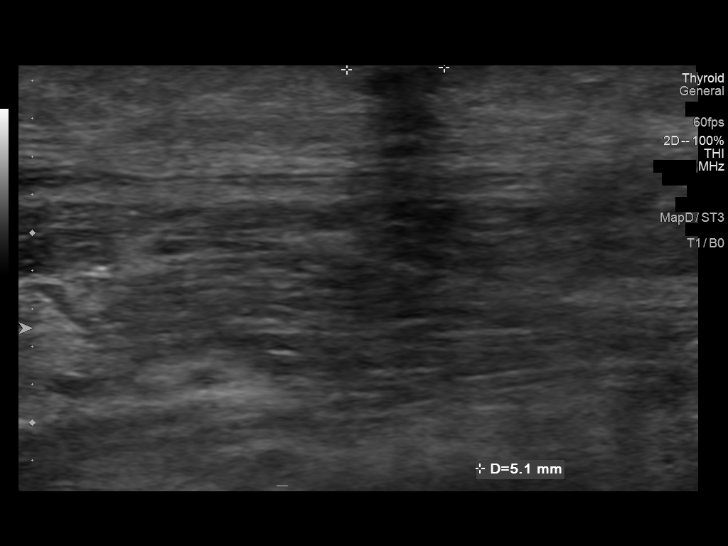
[im 22/36]
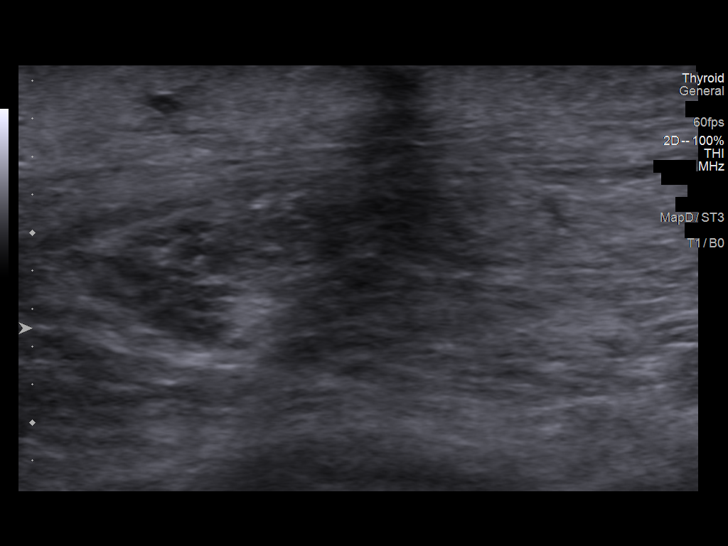
[im 24/36]
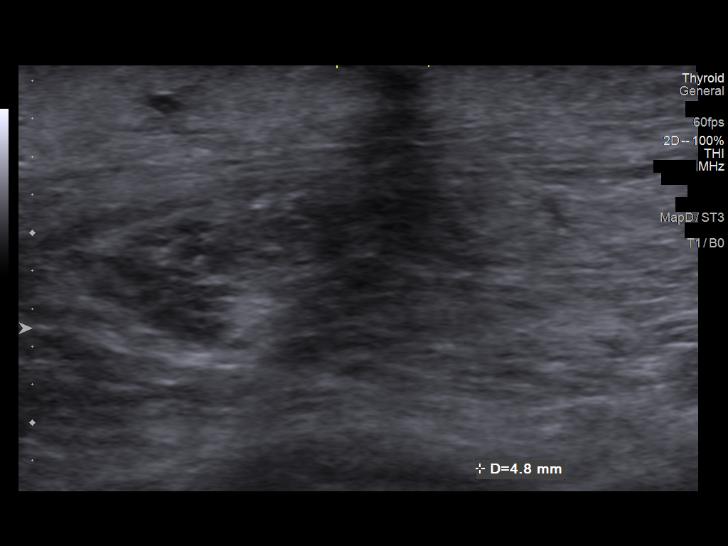
[im 27/36]
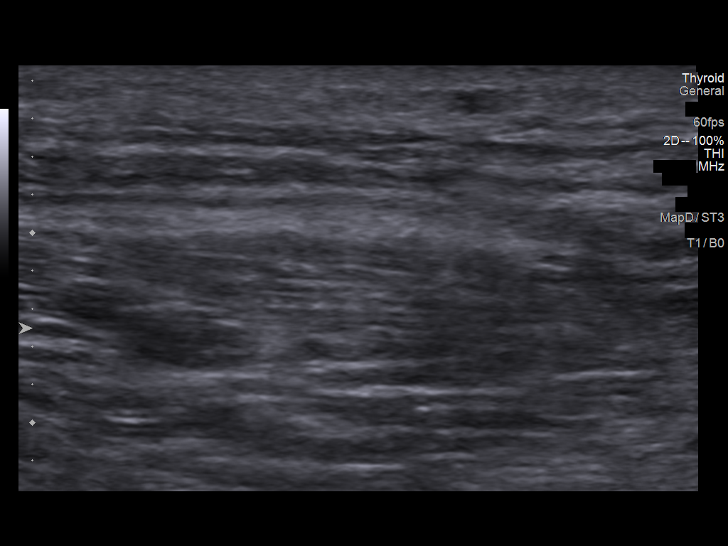
[im 30/36]
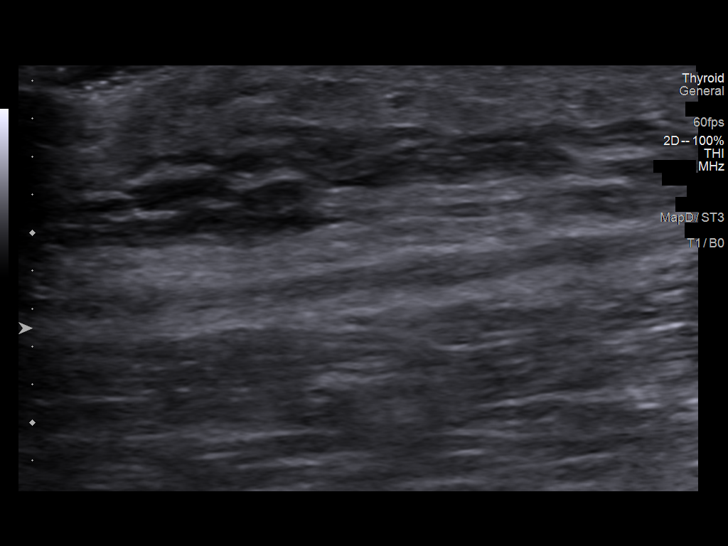
[im 33/36]
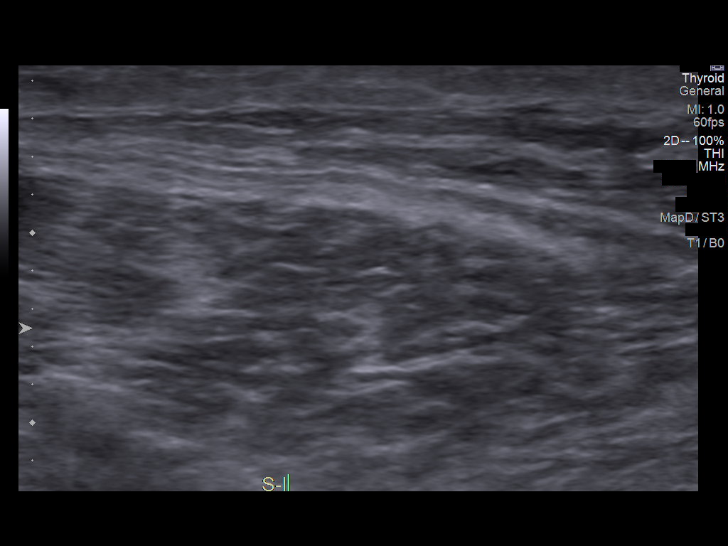
[im 36/36]
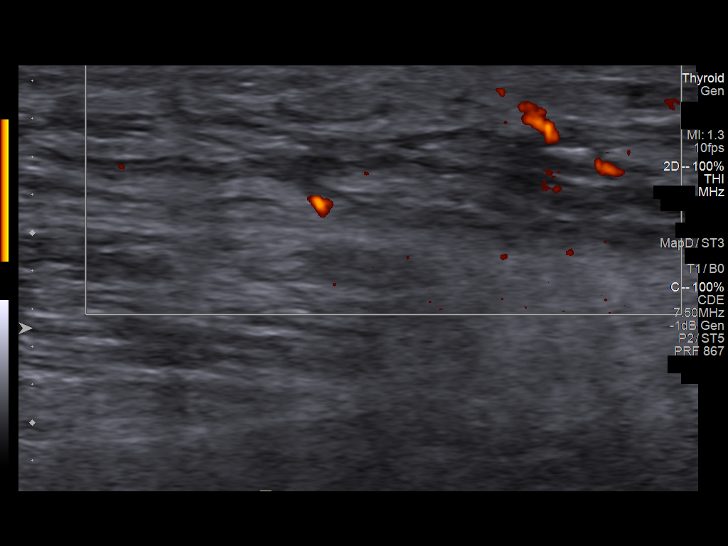

[14 of 25 positions shown; findings below may reference images not displayed]

FINDINGS: Scanning in the area of clinical concern shows areas of soft tissue
edema without focal confluent abscess formation. These appear
related to the recent bite injuries. No other focal abnormality is
noted.
IMPRESSION: Subcutaneous edematous changes without definitive abscess formation.

## 2020-10-01 IMAGING — CT CT ANGIO CHEST
2 of 7 series · 18 of 46 positions shown · IV contrast (Omnipaque or Isovue)
Comparison: Chest radiographs [DATE]. Chest CT [DATE] and
earlier.

CLINICAL DATA: 64-year-old male with abnormal D-dimer. Left upper
and lower extremity edema status post dog bite injury.

EXAM:
CT ANGIOGRAPHY CHEST WITH CONTRAST
TECHNIQUE: Multidetector CT imaging of the chest was performed using the
standard protocol during bolus administration of intravenous
contrast. Multiplanar CT image reconstructions and MIPs were
obtained to evaluate the vascular anatomy.
CONTRAST:  100mL OMNIPAQUE IOHEXOL 350 MG/ML SOLN

[Series 5: pe axial thins · axial · 0.74mm/px · z∈[-208,+70]mm · 15 of 316 slices shown]
[im 19/316  lung]
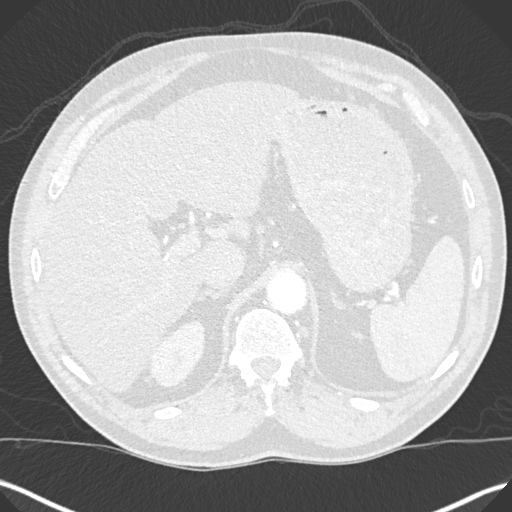
[im 38/316  soft-tissue]
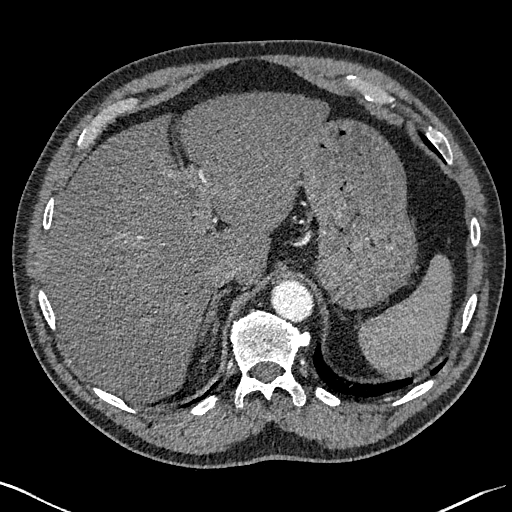
[im 56/316  lung]
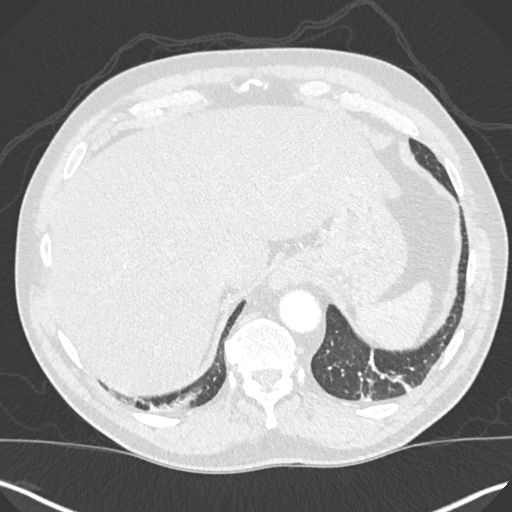
[im 75/316  soft-tissue]
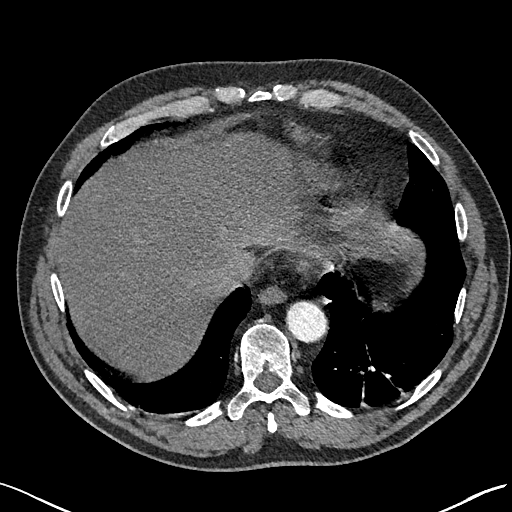
[im 93/316  lung]
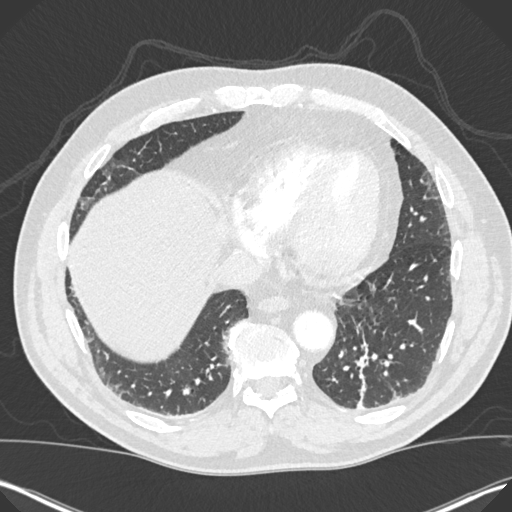
[im 112/316  soft-tissue]
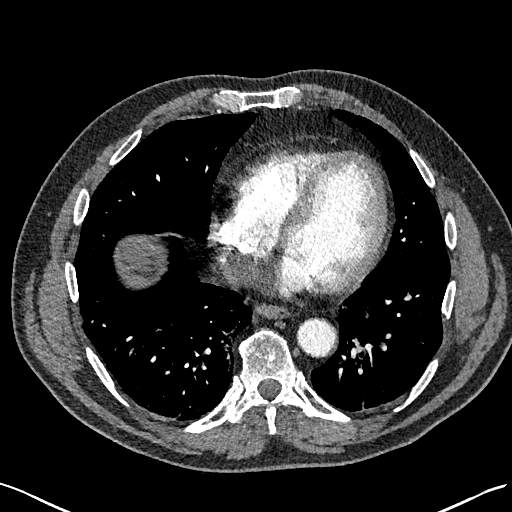
[im 130/316  lung]
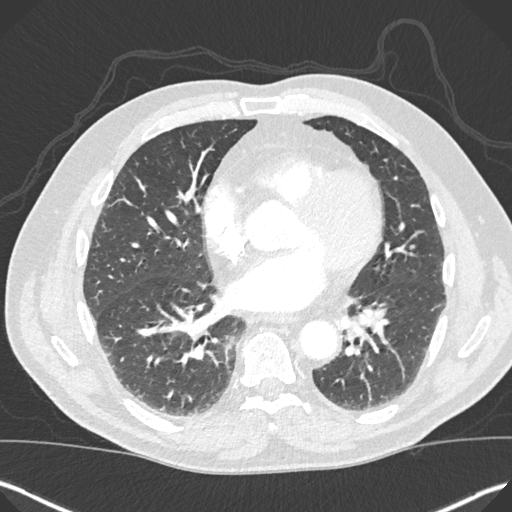
[im 167/316  soft-tissue]
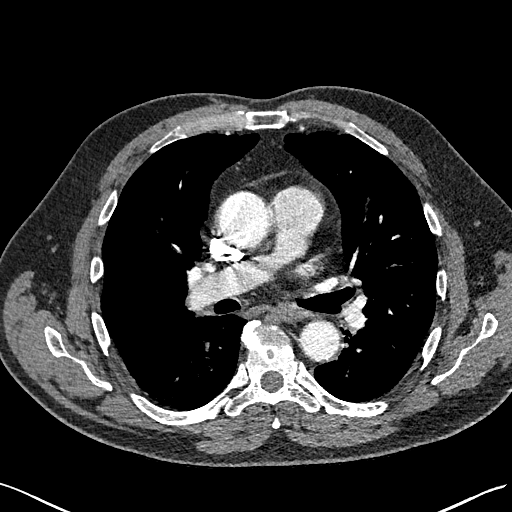
[im 186/316  lung]
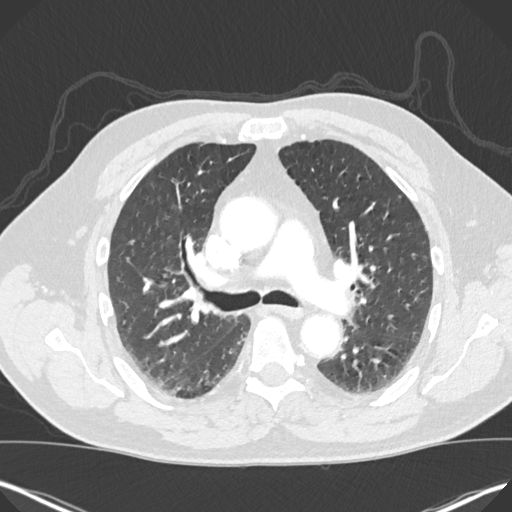
[im 204/316  soft-tissue]
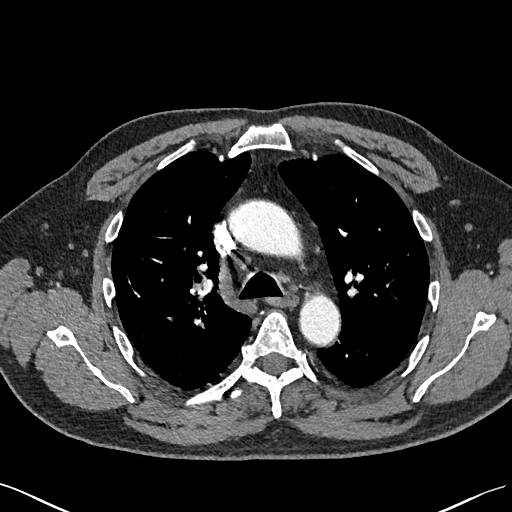
[im 223/316  lung]
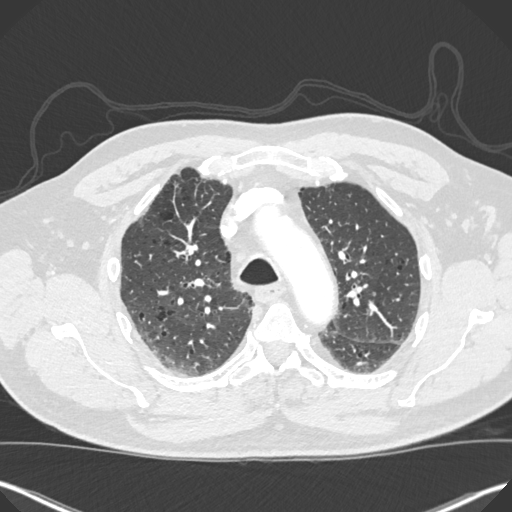
[im 241/316  soft-tissue]
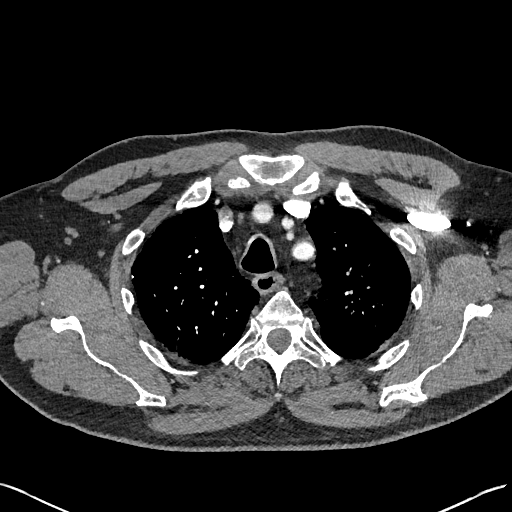
[im 260/316  lung]
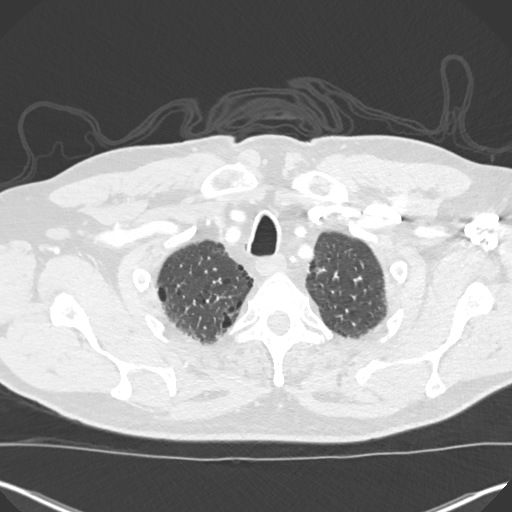
[im 278/316  soft-tissue]
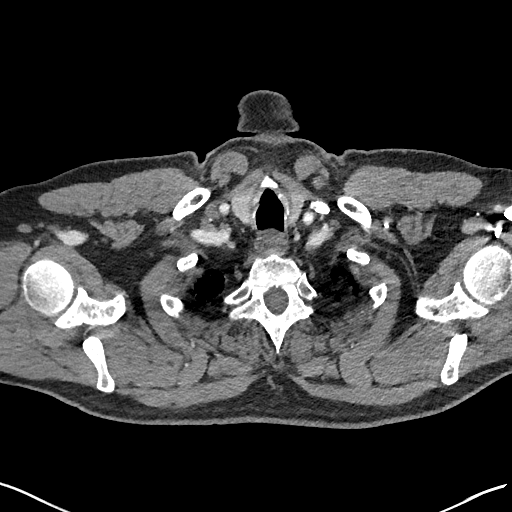
[im 297/316  lung]
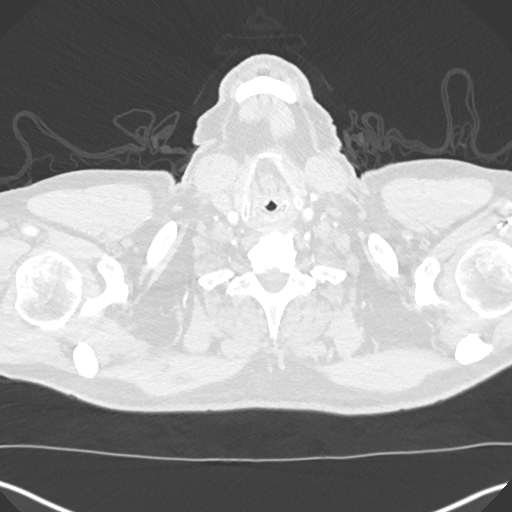

[Series 7: cor soft · coronal · 0.66mm/px · 3 of 138 slices shown]
[im 35/138  soft-tissue]
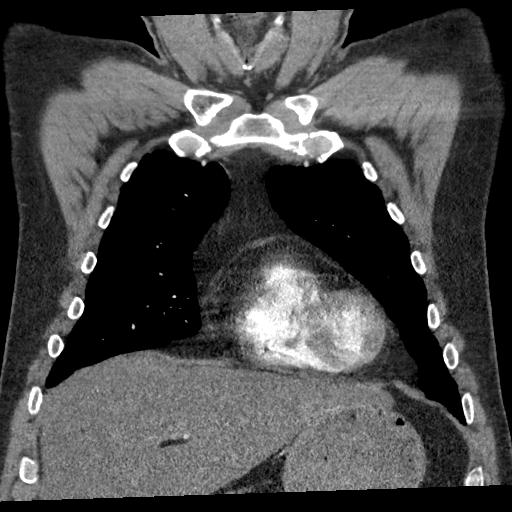
[im 69/138  soft-tissue]
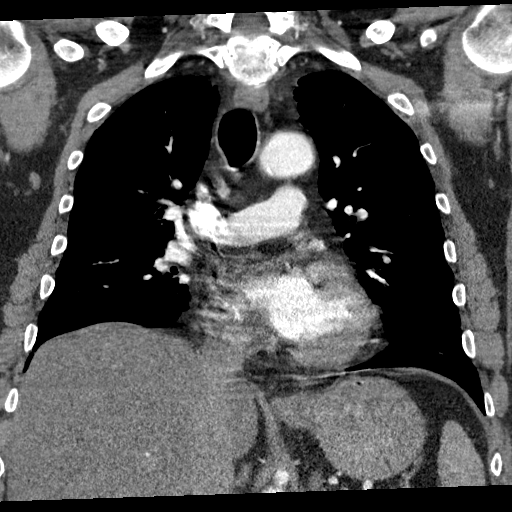
[im 103/138  soft-tissue]
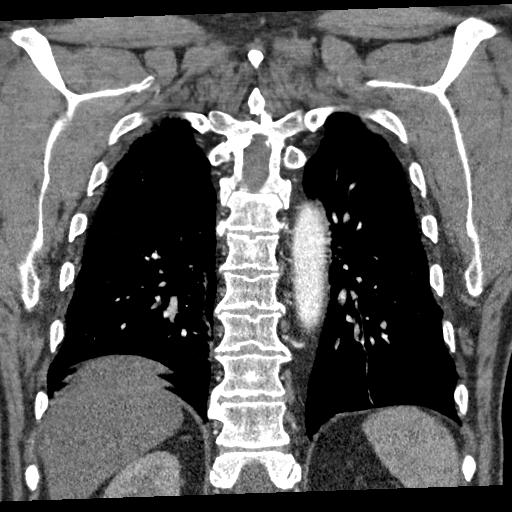

[18 of 46 positions shown; findings below may reference images not displayed]

FINDINGS: Cardiovascular: Suboptimal but adequate contrast bolus timing in the
pulmonary arterial tree. Mild lower lobe respiratory motion.

No focal filling defect identified in the pulmonary arteries to
suggest acute pulmonary embolism.

No cardiomegaly or pericardial effusion. Mild thoracic aortic
atherosclerosis. Four vessel arch configuration, left vertebral
arises directly from the arch (normal variant). Evidence of
calcified coronary artery atherosclerosis on series 5, image 156.

Mediastinum/Nodes: Negative. Small mediastinal lymph nodes appear
stable since [VU].

Lungs/Pleura: Major airways are patent. Chronic upper lobe
paraseptal emphysema. Linear left lower lobe scarring or atelectasis
is new since [VU]. Stable mild thickening and nodularity along the
right minor fissure which is therefore benign. A small left upper
lobe or lingula pulmonary nodule on series 6, image 89 is also
stable and benign. No pleural effusion or other acute pulmonary
opacity.

Upper Abdomen: Evidence of hepatic steatosis. Otherwise negative
visible noncontrast liver, gallbladder, spleen, pancreas, adrenal
glands, kidneys and stomach.

Musculoskeletal: No acute osseous abnormality identified. Lower
cervical spine disc and endplate degeneration.

Review of the MIP images confirms the above findings.
IMPRESSION: 1. Negative for acute pulmonary embolus.
2. Emphysema ([VU]-[VU]) with no acute pulmonary finding. Several
small lung nodules are stable since [VU] and benign.
3. Calcified coronary artery atherosclerosis.
4. Hepatic steatosis.

## 2020-10-01 MED ORDER — IOHEXOL 350 MG/ML SOLN
100.0000 mL | Freq: Once | INTRAVENOUS | Status: AC | PRN
  Administered 2020-10-01: 100 mL via INTRAVENOUS

## 2020-10-01 MED ORDER — VANCOMYCIN HCL IN DEXTROSE 1-5 GM/200ML-% IV SOLN
1000.0000 mg | Freq: Two times a day (BID) | INTRAVENOUS | Status: DC
  Administered 2020-10-02: 1000 mg via INTRAVENOUS
  Filled 2020-10-01: qty 200

## 2020-10-01 MED ORDER — VANCOMYCIN HCL 2000 MG/400ML IV SOLN
2000.0000 mg | Freq: Once | INTRAVENOUS | Status: AC
  Administered 2020-10-01: 2000 mg via INTRAVENOUS
  Filled 2020-10-01: qty 400

## 2020-10-01 MED ORDER — GADOBUTROL 1 MMOL/ML IV SOLN
10.0000 mL | Freq: Once | INTRAVENOUS | Status: AC | PRN
  Administered 2020-10-01: 10 mL via INTRAVENOUS

## 2020-10-01 NOTE — Progress Notes (Signed)
PROGRESS NOTE  Aaron [name] BMW:[id number] DOB: August 06, 1956 DOA: 09/30/2020 PCP: [name], NP  Brief History:  64 year old male with a history of hypertension, hyperlipidemia, tobacco abuse, asthma, anxiety presenting with complaint of left hand swelling and pain. , The patient stated that he was bitten by his dog on 09/28/2020 on his left hand and left thigh area.  Patient came to the emergency department on 09/29/2020.  He was discharged home in stable condition with amoxicillin clavulanate.  He stated he took 2 doses.  He continued to have progressive edema and pain.  As result, he presented for further evaluation.  He denied any fevers, chills, chest pain, shortness breath, cough, hemoptysis, nausea, vomiting, diarrhea, domino pain.  He continues to smoke 1 pack/day for 40 years.  He states that he drinks 2-3 mixed drinks per day. In the emergency department, patient had low-grade temperature 9 9.6 F.  He was hemodynamically stable with oxygen saturation 95% room air.  WBC 8.8, hemoglobin 14.7, platelets 244,000.  The patient was started on IV Unasyn.  X-ray of the left hand showed increasing soft tissue edema without any osseous abnormalities.  Assessment/Plan: Cellulitis of the left hand and left thigh/dog bite -Continue IV Unasyn -MRI left hand--moderate diffuse cellulitis without discrete drainable abscess; myositis without pyomyositis; no findings of septic arthritis or osteomyelitis -Check ESR -Check CRP  Essential hypertension -Continue losartan  Hyperlipidemia -Continue statin  Tobacco abuse -Tobacco cessation discussed -NicoDerm patch  Asthma/emphysema -Stable on room air without wheezing -Continue Breo  Anxiety/depression -Continue home dose alprazolam     Status is: Observation  The patient remains OBS appropriate and will d/c before 2 midnights.  Dispo: The patient is from: Home              Anticipated d/c is to: Home               Anticipated d/c date is: 1 day              Patient currently is not medically stable to d/c.        Family Communication:   No Family at bedside  Consultants:  Ortho--[place]  Code Status:  FULL   DVT Prophylaxis:  SCDs   Procedures: As Listed in Progress Note Above  Antibiotics: Unasyn 11/7>>>    Subjective: Patient feels that his left hand left thigh are slowly improving.  He denies P fevers, chills, headache, chest pain, dyspnea, nausea, vomiting, diarrhea, abdominal pain, dysuria, hematuria, hematochezia, and melena.   Objective: Vitals:   09/30/20 2300 10/01/20 0045 10/01/20 0221 10/01/20 0456  BP: 140/82 126/79  140/79  Pulse: 90 73  (!) 55  Resp: $Remo'18 20  20  'AHYCw$ Temp: 98.7 F (37.1 C) 98.4 F (36.9 C)  98.2 F (36.8 C)  TempSrc: Oral Oral  Oral  SpO2: 97% 93%  95%  Weight:   106.1 kg   Height:   '6\' 2"'$  (1.88 m)     Intake/Output Summary (Last 24 hours) at 10/01/2020 1031 Last data filed at 09/30/2020 1448 Gross per 24 hour  Intake 100 ml  Output --  Net 100 ml   Weight change:  Exam:   General:  Pt is alert, follows commands appropriately, not in acute distress  HEENT: No icterus, No thrush, No neck mass, [place]/AT  Cardiovascular: RRR, S1/S2, no rubs, no gallops  Respiratory: bibasilar rales. No wheeze  Abdomen: Soft/+BS, non tender, non distended, no guarding  Extremities:  see pictures below of hand and thigh        Data Reviewed: I have personally reviewed following labs and imaging studies Basic Metabolic Panel: Recent Labs  Lab 09/30/20 1406 10/01/20 0612  NA 133* 138  K 3.5 3.6  CL 101 102  CO2 24 28  GLUCOSE 99 94  BUN 8 7*  CREATININE 0.80 0.79  CALCIUM 8.5* 8.6*   Liver Function Tests: Recent Labs  Lab 09/30/20 1406 10/01/20 0612  AST 36 27  ALT 37 31  ALKPHOS 68 61  BILITOT 0.8 0.8  PROT 7.3 6.6  ALBUMIN 4.0 3.6   No results for input(s): LIPASE, AMYLASE in the last 168 hours. No results for input(s): AMMONIA  in the last 168 hours. Coagulation Profile: No results for input(s): INR, PROTIME in the last 168 hours. CBC: Recent Labs  Lab 09/30/20 1406 10/01/20 0612  WBC 8.8 7.2  NEUTROABS 5.7  --   HGB 14.7 14.0  HCT 43.1 42.6  MCV 93.7 96.4  PLT 244 241   Cardiac Enzymes: No results for input(s): CKTOTAL, CKMB, CKMBINDEX, TROPONINI in the last 168 hours. BNP: Invalid input(s): POCBNP CBG: No results for input(s): GLUCAP in the last 168 hours. HbA1C: No results for input(s): HGBA1C in the last 72 hours. Urine analysis:    Component Value Date/Time   COLORURINE YELLOW 08/12/2019 1927   APPEARANCEUR CLEAR 08/12/2019 1927   LABSPEC 1.015 08/12/2019 1927   PHURINE 6.5 08/12/2019 1927   GLUCOSEU NEGATIVE 08/12/2019 1927   HGBUR NEGATIVE 08/12/2019 1927   BILIRUBINUR NEGATIVE 08/12/2019 1927   KETONESUR NEGATIVE 08/12/2019 1927   PROTEINUR NEGATIVE 08/12/2019 1927   UROBILINOGEN 0.2 09/14/2012 1609   NITRITE NEGATIVE 08/12/2019 1927   LEUKOCYTESUR NEGATIVE 08/12/2019 1927   Sepsis Labs: $[id number]$ (procalcitonin:4,lacticidven:4) ) Recent Results (from the past 240 hour(s))  Respiratory Panel by RT PCR (Flu A&B, Covid) - Nasopharyngeal Swab     Status: None   Collection Time: 09/30/20  1:25 PM   Specimen: Nasopharyngeal Swab  Result Value Ref Range Status   SARS Coronavirus 2 by RT PCR NEGATIVE NEGATIVE Final    Comment: (NOTE) SARS-CoV-2 target nucleic acids are NOT DETECTED.  The SARS-CoV-2 RNA is generally detectable in upper respiratoy specimens during the acute phase of infection. The lowest concentration of SARS-CoV-2 viral copies this assay can detect is 131 copies/mL. A negative result does not preclude SARS-Cov-2 infection and should not be used as the sole basis for treatment or other patient management decisions. A negative result may occur with  improper specimen collection/handling, submission of specimen other than nasopharyngeal swab, presence of viral  mutation(s) within the areas targeted by this assay, and inadequate number of viral copies (<131 copies/mL). A negative result must be combined with clinical observations, patient history, and epidemiological information. The expected result is Negative.  Fact Sheet for Patients:  PinkCheek.be  Fact Sheet for Healthcare Providers:  GravelBags.it  This test is no t yet approved or cleared by the [place] FDA and  has been authorized for detection and/or diagnosis of SARS-CoV-2 by FDA under an Emergency Use Authorization (EUA). This EUA will remain  in effect (meaning this test can be used) for the duration of the COVID-19 declaration under Section 564(b)(1) of the Act, 21 U.S.C. section 360bbb-3(b)(1), unless the authorization is terminated or revoked sooner.     Influenza A by PCR NEGATIVE NEGATIVE Final   Influenza B by PCR NEGATIVE NEGATIVE Final    Comment: (NOTE) The Xpert Xpress SARS-CoV-2/FLU/RSV  assay is intended as an aid in  the diagnosis of influenza from Nasopharyngeal swab specimens and  should not be used as a sole basis for treatment. Nasal washings and  aspirates are unacceptable for Xpert Xpress SARS-CoV-2/FLU/RSV  testing.  Fact Sheet for Patients: PinkCheek.be  Fact Sheet for Healthcare Providers: GravelBags.it  This test is not yet approved or cleared by the [place] FDA and  has been authorized for detection and/or diagnosis of SARS-CoV-2 by  FDA under an Emergency Use Authorization (EUA). This EUA will remain  in effect (meaning this test can be used) for the duration of the  Covid-19 declaration under Section 564(b)(1) of the Act, 21  U.S.C. section 360bbb-3(b)(1), unless the authorization is  terminated or revoked. Performed at [facility], [address], [place], [place] [address]   Culture, blood (single)     Status: None  (Preliminary result)   Collection Time: 09/30/20  2:37 PM   Specimen: BLOOD LEFT ARM  Result Value Ref Range Status   Specimen Description BLOOD LEFT ARM BOTTLES DRAWN AEROBIC AND ANAEROBIC  Final   Special Requests Blood Culture adequate volume  Final   Culture   Final    NO GROWTH < 24 HOURS Performed at [facility] [address], [place], [place] [address]    Report Status PENDING  Incomplete     Scheduled Meds: . aspirin EC  81 mg Oral Daily  . fluticasone furoate-vilanterol  1 puff Inhalation Daily  . losartan  50 mg Oral Daily  . nicotine  21 mg Transdermal Daily  . pantoprazole  40 mg Oral Daily  . simvastatin  20 mg Oral QHS   Continuous Infusions: . ampicillin-sulbactam (UNASYN) IV 3 g (10/01/20 0903)    Procedures/Studies: MR HAND LEFT W WO CONTRAST  Result Date: 10/01/2020 CLINICAL DATA:  Dog bite. Hand pain and swelling. EXAM: MRI OF THE LEFT HAND WITHOUT AND WITH CONTRAST TECHNIQUE: Multiplanar, multisequence MR imaging of the left hand was performed before and after the administration of intravenous contrast. CONTRAST:  2mL GADAVIST GADOBUTROL 1 MMOL/ML IV SOLN COMPARISON:  None. FINDINGS: Moderate degenerative changes are noted involving the hand and wrist. The most significant degenerative changes are at the first MCP joint with significant joint space narrowing, cartilage loss and subchondral cystic change. I do not see any findings suspicious for septic arthritis. There is a small open wound noted near the base of the thumb along the dorsum of the hand. There may be a small amount of air in the wound. There is moderate cellulitis with diffuse subcutaneous soft tissue swelling/edema/fluid and enhancement all along the dorsum of the hand. There is also significant myositis mainly involving the first dorsal interosseous muscle. No findings for pyomyositis. No findings to suggest osteomyelitis. No discrete drainable soft tissue abscess. IMPRESSION: 1. Small open wound  near the base of the thumb along the dorsum of the hand. There may be a small amount of air in the wound. 2. Moderate diffuse cellulitis involving the dorsum of the hand. No discrete drainable soft tissue abscess. 3. Myositis involving the first dorsal interosseous muscle without evidence of pyomyositis. 4.  findings for septic arthritis or osteomyelitis. 5. Moderate degenerative changes involving the hand and wrist. Electronically Signed   By: Marijo Sanes M.D.   On: 10/01/2020 10:21   DG Hand Complete Left  Result Date: 09/30/2020 CLINICAL DATA:  Dog bite, wound check, swelling EXAM: LEFT HAND - COMPLETE 3+ VIEW COMPARISON:  09/29/2020 FINDINGS: Osseous mineralization normal.  Mild degenerative changes at first MCP joint. Remaining joint spaces preserved. Non fused ossicle at ulnar styloid. No acute fracture, dislocation, or bone destruction. Increased soft tissue swelling of LEFT hand and wrist since prior study. IMPRESSION: Increased soft tissue swelling. No acute osseous abnormalities. Degenerative changes LEFT first MCP joint. Electronically Signed   By: Lavonia Dana M.D.   On: 09/30/2020 11:40   DG Hand Complete Left  Result Date: 09/29/2020 CLINICAL DATA:  Dog bite left thumb EXAM: LEFT HAND - COMPLETE 3+ VIEW COMPARISON:  None. FINDINGS: There is no evidence of fracture or dislocation. There is no evidence of arthropathy or other focal bone abnormality. Soft tissues are unremarkable. No radiopaque foreign body or soft tissue gas. IMPRESSION: No acute bony abnormality. Electronically Signed   By: Rolm Baptise M.D.   On: 09/29/2020 01:47   [place] LT LOWER EXTREM LTD SOFT TISSUE NON VASCULAR  Result Date: 10/01/2020 CLINICAL DATA:  Recent dog bites with possible abscess EXAM: ULTRASOUND LEFT LOWER EXTREMITY LIMITED TECHNIQUE: Ultrasound examination of the lower extremity soft tissues was performed in the area of clinical concern. COMPARISON:  None. FINDINGS: Scanning in the area of clinical concern  shows areas of soft tissue edema without focal confluent abscess formation. These appear related to the recent bite injuries. No other focal abnormality is noted. IMPRESSION: Subcutaneous edematous changes without definitive abscess formation. Electronically Signed   By: Inez Catalina M.D.   On: 10/01/2020 09:43    Orson Eva, DO  [facility]  If 7PM-7AM, please contact night-coverage [email or web address] Password [email or web address] 10/01/2020, 10:31 AM   LOS: 0 days

## 2020-10-01 NOTE — Progress Notes (Signed)
Pharmacy Antibiotic Note  Aaron [name] is a 64 y.o. male admitted on 09/30/2020 with wounds infection.  Pharmacy has been consulted for Vancomycin dosing.  Plan: Vancomycin 2000mg  loading dose, then 1000mg  IV every 12 hours.  Goal trough 10-15 mcg/mL.  F/U cxs and clinical progress Monitor V/S, labs and levels as indicated  Height: 6\' 2"  (188 cm) Weight: 106.1 kg (233 lb 15.9 oz) IBW/kg (Calculated) : 82.2  Temp (24hrs), Avg:98.8 F (37.1 C), Min:98.2 F (36.8 C), Max:99.6 F (37.6 C)  Recent Labs  Lab 09/30/20 1406 10/01/20 0612  WBC 8.8 7.2  CREATININE 0.80 0.79    Estimated Creatinine Clearance: 124.3 mL/min (by C-G formula based on SCr of 0.79 mg/dL).    No Known Allergies  Antimicrobials this admission: Vancomycin 11/8 >>  Unasyn 11/7>>    Microbiology results:   11/7 BC x1:  NG<24h   11/7 Resp PCR: SARS CoV-2: neg      Flu A/B:   neg x2    Thank you for allowing pharmacy to be a part of this patient's care.  Isac Sarna, BS [place], [place] Clinical Pharmacist Pager [phone] 10/01/2020 4:34 PM

## 2020-10-02 ENCOUNTER — Inpatient Hospital Stay (HOSPITAL_COMMUNITY): Payer: 59

## 2020-10-02 DIAGNOSIS — L02519 Cutaneous abscess of unspecified hand: Secondary | ICD-10-CM

## 2020-10-02 DIAGNOSIS — L03119 Cellulitis of unspecified part of limb: Secondary | ICD-10-CM

## 2020-10-02 LAB — BASIC METABOLIC PANEL
Anion gap: 12 (ref 5–15)
BUN: 9 mg/dL (ref 8–23)
CO2: 23 mmol/L (ref 22–32)
Calcium: 8.7 mg/dL — ABNORMAL LOW (ref 8.9–10.3)
Chloride: 104 mmol/L (ref 98–111)
Creatinine, Ser: 0.87 mg/dL (ref 0.61–1.24)
GFR, Estimated: >60 mL/min (ref >60)
Glucose, Bld: 93 mg/dL (ref 70–99)
Potassium: 4.2 mmol/L (ref 3.5–5.1)
Sodium: 139 mmol/L (ref 135–145)

## 2020-10-02 LAB — CBC
HCT: 44.1 % (ref 39.0–52.0)
Hemoglobin: 14.6 g/dL (ref 13.0–17.0)
MCH: 31.7 pg (ref 26.0–34.0)
MCHC: 33.1 g/dL (ref 30.0–36.0)
MCV: 95.7 fL (ref 80.0–100.0)
Platelets: 261 10*3/uL (ref 150–400)
RBC: 4.61 MIL/uL (ref 4.22–5.81)
RDW: 12 % (ref 11.5–15.5)
WBC: 7 10*3/uL (ref 4.0–10.5)
nRBC: 0 % (ref 0.0–0.2)

## 2020-10-02 IMAGING — MR MR FEMUR*L* W/O CM
5 series · 37 of 40 positions shown · non-contrast
Comparison: Ultrasound [DATE]

CLINICAL DATA: Left thigh dog bite. Concern for soft tissue
infection

EXAM:
MR OF THE LEFT FEMUR WITHOUT CONTRAST
TECHNIQUE: Multiplanar, multisequence MR imaging of the left femur was
performed. No intravenous contrast was administered.

[Series 7: composed cor t1_comp_filt · coronal · left · 5.6mm · 1.08mm/px · 6 of 41 slices shown]
[im 1/41]
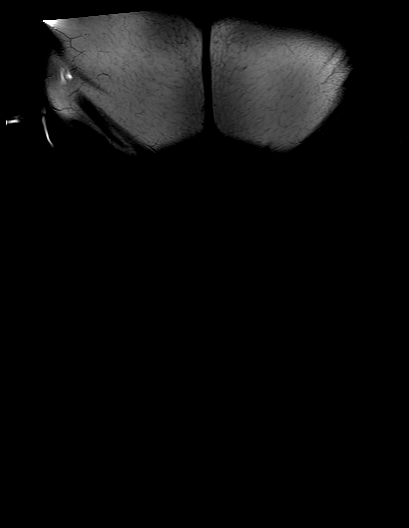
[im 9/41]
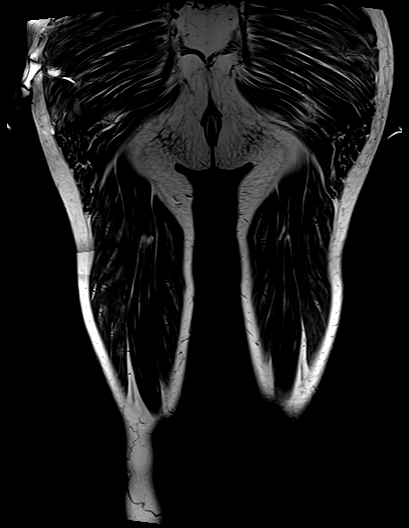
[im 17/41]
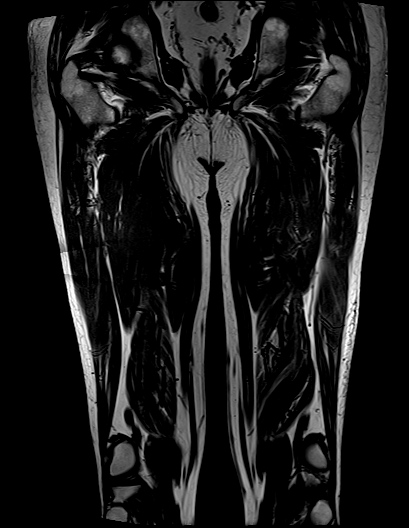
[im 25/41]
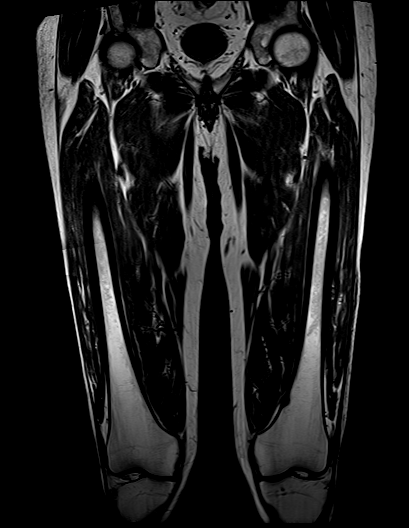
[im 33/41]
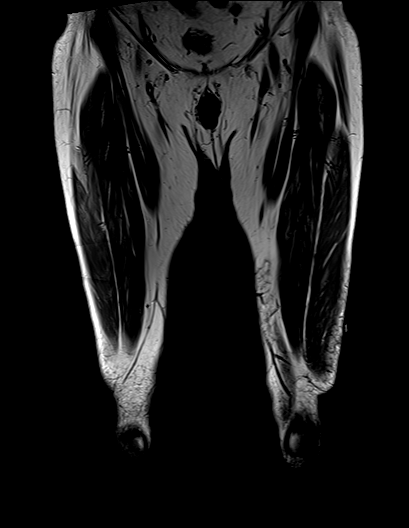
[im 41/41]
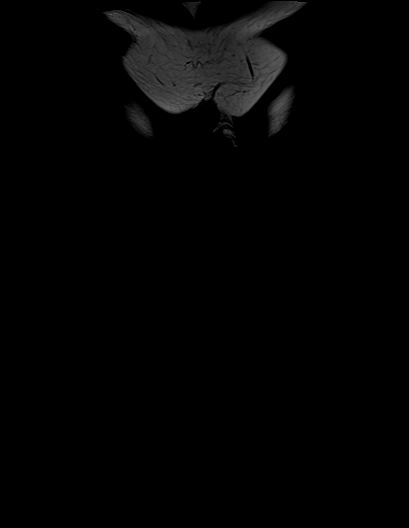

[Series 11: composed cor stir_comp_filt · coronal · left · 5.6mm · 1.08mm/px · 5 of 41 slices shown]
[im 1/41]
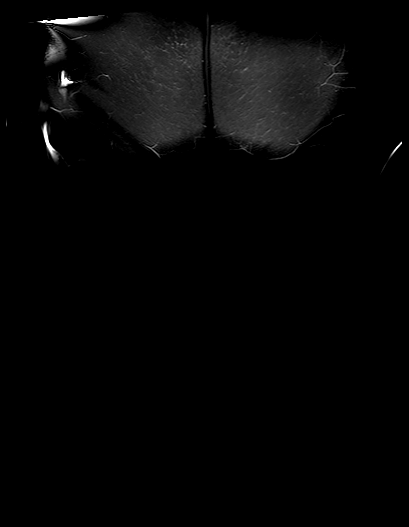
[im 11/41]
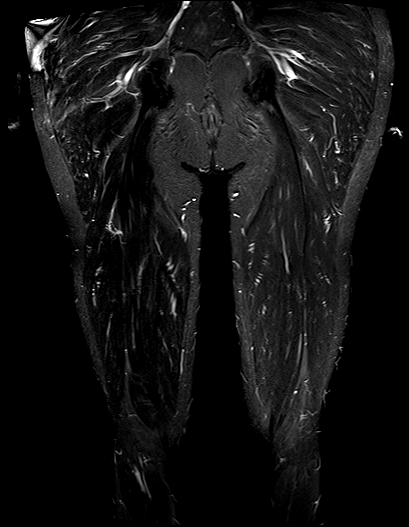
[im 21/41]
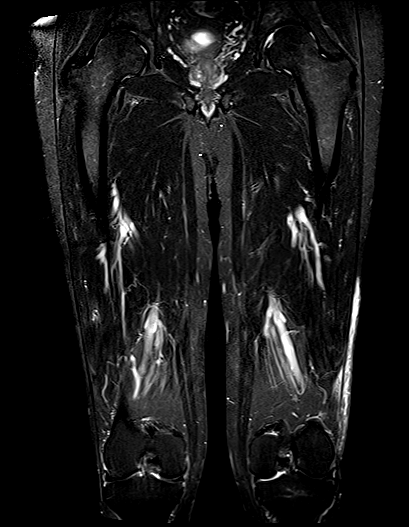
[im 31/41]
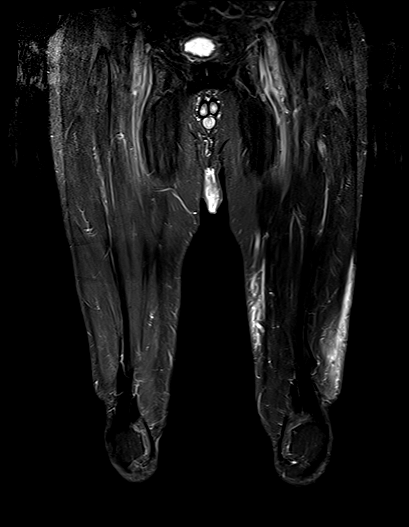
[im 41/41]
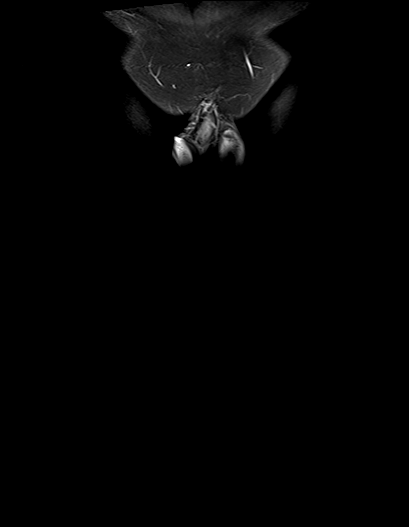

[Series 14: ax t1_comp · axial · left · 5.0mm · 0.86mm/px · z∈[-268,+277]mm · 12 of 92 slices shown]
[im 1/92]
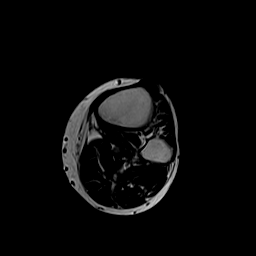
[im 9/92]
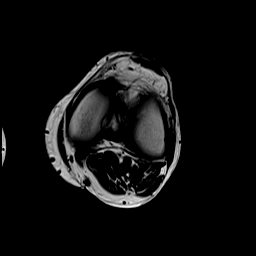
[im 17/92]
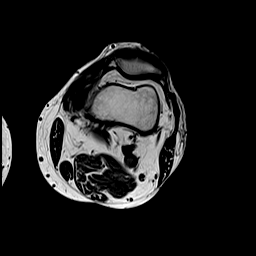
[im 25/92]
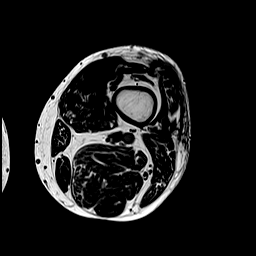
[im 34/92]
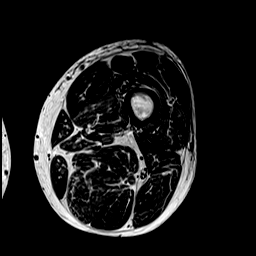
[im 42/92]
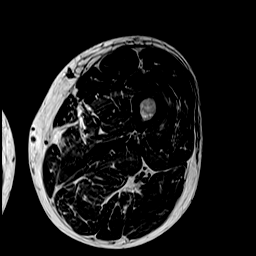
[im 50/92]
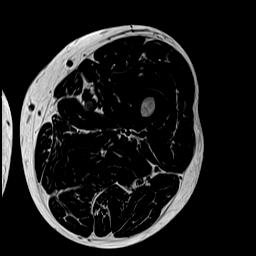
[im 58/92]
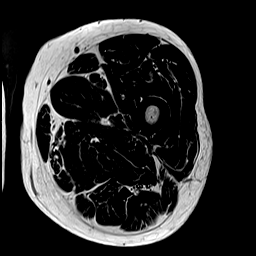
[im 67/92]
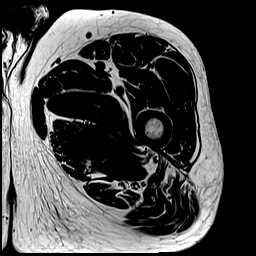
[im 75/92]
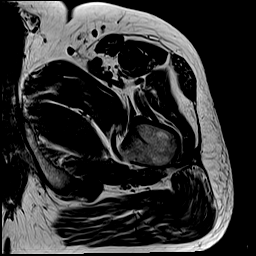
[im 83/92]
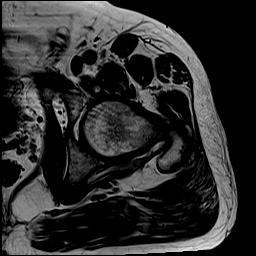
[im 92/92]
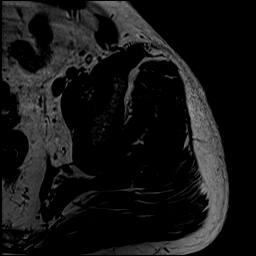

[Series 17: T2 · axial · left · 5.0mm · 0.86mm/px · z∈[-268,+277]mm · 9 of 92 slices shown]
[im 1/92]
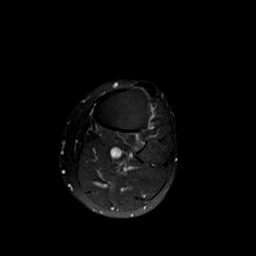
[im 17/92]
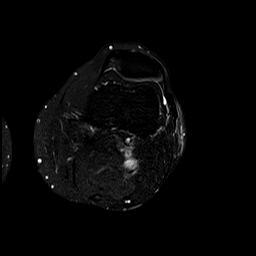
[im 25/92]
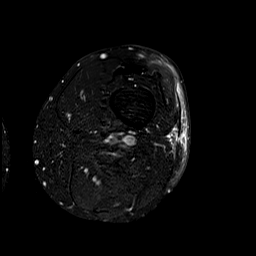
[im 42/92]
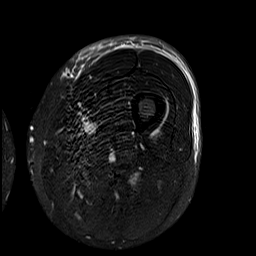
[im 50/92]
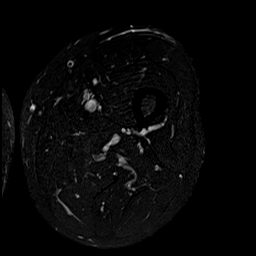
[im 67/92]
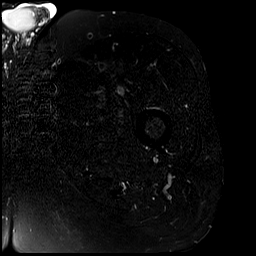
[im 75/92]
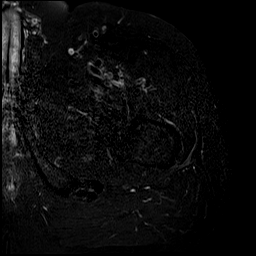
[im 83/92]
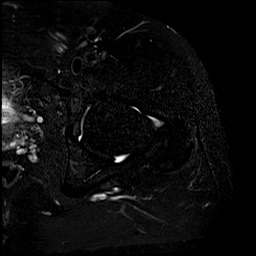
[im 92/92]
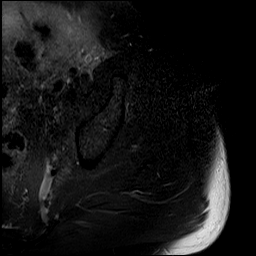

[Series 20: t2_tse_sag fs_comp · sagittal · left · 5.6mm · 0.94mm/px · 5 of 36 slices shown]
[im 1/36]
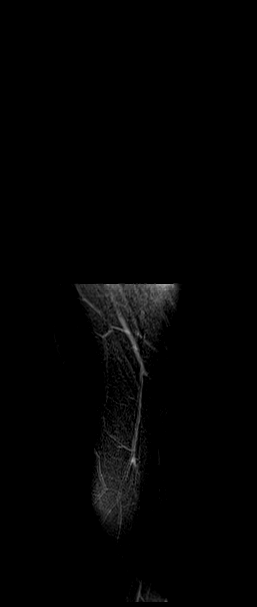
[im 9/36]
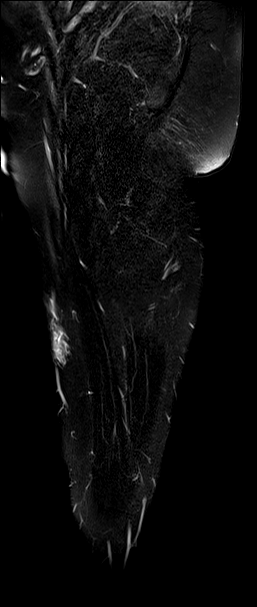
[im 18/36]
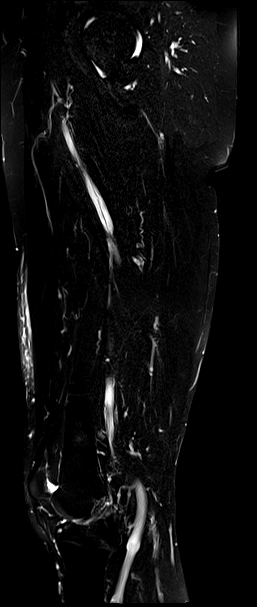
[im 27/36]
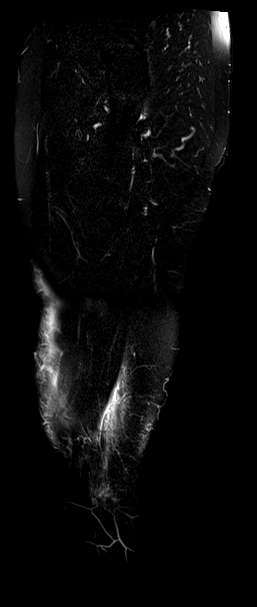
[im 36/36]
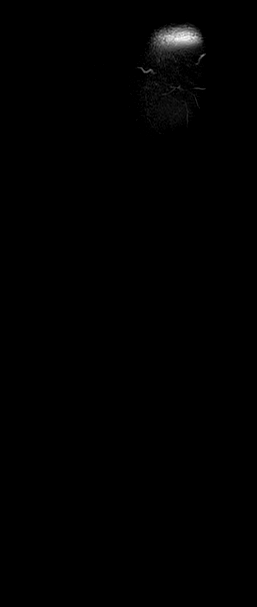

[37 of 40 positions shown; findings below may reference images not displayed]

FINDINGS: Bones/Joint/Cartilage

No acute fracture. No dislocation. No femoral head avascular
necrosis. No bony erosion or periostitis. The left hip and left knee
are intact without significant arthropathy. No hip or knee joint
effusion.

Incidental note of a well-circumscribed ovoid T2 hyperintense
cortically based lesion at the medial aspect of the distal left
femoral metadiaphysis measuring 1.3 x 0.9 x 0.9 cm (series 17, image
71). Lesion has a well defined low T1/T2 signal intensity rim. No
signal changes within the adjacent marrow or cortex. No cortical
breakthrough or soft tissue component.

Ligaments

Intact.

Muscles and Tendons

There is irregularity involving a the superficial aspect of the
distal left vastus lateralis muscle with (series 17, image 63)
compatible with a laceration/partial muscle tear. Surrounding
intramuscular edema, which may be posttraumatic or reflect a
localized myositis. No intramuscular fluid collection. Remaining
musculature is normal in bulk and signal intensity. Intact tendinous
structures.

Soft tissues

There is prominent subcutaneous edema within the anterolateral soft
tissues of the mid to distal left thigh. A small amount of fluid
layers over the superficial investing fascia of the anterior
compartment. No deep fascial fluid or edema. No organized soft
tissue fluid collection.
IMPRESSION: 1. Superficial laceration/partial muscle tear of the distal left
vastus lateralis muscle compatible with penetrating trauma.
Surrounding intramuscular edema, which may be posttraumatic or
reflect a localized myositis. No intramuscular fluid collection.
2. Prominent subcutaneous edema within the anterolateral soft
tissues of the mid to distal left thigh. A small amount of fluid
layers over the superficial investing fascia of the anterior
compartment. No organized soft tissue fluid collection.
3. No acute osseous abnormality or evidence of osteomyelitis.
4. Incidental note of a nonaggressive appearing 1.3 x 0.9 x 0.9 cm
cortically based lesion at the medial aspect of the distal left
femoral metadiaphysis. Findings are most consistent with a benign
fibro-osseous lesion. Consider radiographic follow-up of the left
knee in 6 months to ensure stability.

## 2020-10-02 MED ORDER — DOXYCYCLINE HYCLATE 100 MG PO TABS
100.0000 mg | ORAL_TABLET | Freq: Two times a day (BID) | ORAL | 0 refills | Status: DC
Start: 2020-10-02 — End: 2020-10-23

## 2020-10-02 MED ORDER — AMOXICILLIN-POT CLAVULANATE 875-125 MG PO TABS: 1.0000 | ORAL_TABLET | Freq: Two times a day (BID) | ORAL | 0 refills | Status: DC

## 2020-10-02 MED ORDER — DOXYCYCLINE HYCLATE 100 MG PO TABS
100.0000 mg | ORAL_TABLET | Freq: Two times a day (BID) | ORAL | Status: DC
  Administered 2020-10-02: 100 mg via ORAL
  Filled 2020-10-02: qty 1

## 2020-10-02 NOTE — Plan of Care (Signed)
DISCHARGE NOTE HOME Aaron [name] to be discharged home per MD order. Discussed prescriptions and follow up appointments with the patient. Prescriptions given to patient; medication list explained in detail. Patient verbalized understanding.  IV catheter discontinued intact. Site without signs and symptoms of complications. Dressing and pressure applied. Pt denies pain at the site currently. No complaints noted.  Patient free of lines and drains..   An After Visit Summary (AVS) was printed and given to the patient. Patient escorted to the main entrance and discharged home via private auto.  Stephan Minister, RN

## 2020-10-02 NOTE — Discharge Summary (Addendum)
Physician Discharge Summary  Aaron [name] KYH:[id number] DOB: 03/07/1956 DOA: 09/30/2020  PCP: [name], NP  Admit date: 09/30/2020 Discharge date: 10/02/2020  Admitted From: Home Disposition:  Home   Recommendations for Outpatient Follow-up:  1. Follow up with PCP in 1-2 weeks 2. Please obtain BMP/CBC in one week     Discharge Condition: Stable CODE STATUS: FULL Diet recommendation: Heart Healthy   Brief/Interim Summary: 64 year old male with a history of hypertension, hyperlipidemia, tobacco abuse, asthma, anxiety presenting with complaint of left hand swelling and pain. , The patient stated that he was bitten by his dog on 09/28/2020 on his left hand and left thigh area.  Patient came to the emergency department on 09/29/2020.  He was discharged home in stable condition with amoxicillin clavulanate.  He stated he took 2 doses.  He continued to have progressive edema and pain.  As result, he presented for further evaluation.  He denied any fevers, chills, chest pain, shortness breath, cough, hemoptysis, nausea, vomiting, diarrhea, domino pain.  He continues to smoke 1 pack/day for 40 years.  He states that he drinks 2-3 mixed drinks per day. In the emergency department, patient had low-grade temperature 9 9.6 F.  He was hemodynamically stable with oxygen saturation 95% room air.  WBC 8.8, hemoglobin 14.7, platelets 244,000.  The patient was started on IV Unasyn.  X-ray of the left hand showed increasing soft tissue edema without any osseous abnormalities.  Discharge Diagnoses:  Cellulitis of the left hand and left thigh/dog bite/myositis -Continue IV Unasyn -add IV vanco -MRI left hand--moderate diffuse cellulitis without discrete drainable abscess; myositis without pyomyositis; no findings of septic arthritis or osteomyelitis -MRI left thigh--no drainable abscess; myositis; A small amount of fluid layers over the superficial investing fascia of the anterior compartment.  No organized soft tissue fluid collection; nonaggressive appearing 1.3 x 0.9 x 0.9 cm cortically based lesion at the medial aspect of the distal left femoral metadiaphysis. Findings are most consistent with a benign fibro-osseous lesion. Consider radiographic follow-up of the left knee in 6 months -Check ESR--13 -Check CRP--8.5 -no leukocytosis or fever at time of d/c -d/c home with amox/clav and doxy x 8 more days  Essential hypertension -Continue losartan  Hyperlipidemia -Continue statin  Tobacco abuse -Tobacco cessation discussed -NicoDerm patch  Asthma/emphysema -Stable on room air without wheezing -Continue Breo  Anxiety/depression -Continue home dose alprazolam  Atypical Chest pain -personally reviewed EKG--sinus, no STT changes -troponins--neg -CTA chest--neg PE, stable pulm nodule, bibasilar scarring  Left Femur fibrocystic lesion -appears benign on imaging -patient made aware -outpatient surveillance MRI  Discharge Instructions   Allergies as of 10/02/2020   No Known Allergies     Medication List    STOP taking these medications   amoxicillin-clavulanate 875-125 MG tablet Commonly known as: AUGMENTIN     TAKE these medications   albuterol 108 (90 Base) MCG/ACT inhaler Commonly known as: VENTOLIN HFA Inhale 2 puffs into the lungs every 6 (six) hours as needed. For cough and wheezing.   ALPRAZolam 0.25 MG tablet Commonly known as: XANAX TAKE 1 TABLET BY MOUTH ONCE A DAY AS NEEDED FOR ANXIETY What changed: See the new instructions.   aspirin EC 81 MG tablet Take 81 mg by mouth daily.   Breo Ellipta 200-25 MCG/INH Aepb Generic drug: fluticasone furoate-vilanterol INHALE 1 PUFF INTO LUNGS ONCE DAILY. What changed: See the new instructions.   buPROPion 150 MG 24 hr tablet Commonly known as: WELLBUTRIN XL Take one tablet by mouth once daily  for 3 days, then Increase to One by mouth twice daily.   losartan 50 MG tablet Commonly known  as: COZAAR TAKE 1 TABLET BY MOUTH ONCE DAILY TO CONTROL BLOOD PRESSURE DX I10   pantoprazole 40 MG tablet Commonly known as: PROTONIX Take 1 tablet (40 mg total) by mouth daily.   sildenafil 50 MG tablet Commonly known as: VIAGRA TAKE 1 TABLET BY MOUTH AS NEEDED FOR ERECTILE DYSFUNCTION What changed: See the new instructions.   simvastatin 20 MG tablet Commonly known as: ZOCOR Take one tablet by mouth once daily at bedtime.   traMADol 50 MG tablet Commonly known as: ULTRAM Take 2 tablets (100 mg total) by mouth every 6 (six) hours as needed.       No Known Allergies  Consultations:  none   Procedures/Studies: CT ANGIO CHEST PE W OR WO CONTRAST  Result Date: 10/01/2020 CLINICAL DATA:  64 year old male with abnormal D-dimer. Left upper and lower extremity edema status post dog bite injury. EXAM: CT ANGIOGRAPHY CHEST WITH CONTRAST TECHNIQUE: Multidetector CT imaging of the chest was performed using the standard protocol during bolus administration of intravenous contrast. Multiplanar CT image reconstructions and MIPs were obtained to evaluate the vascular anatomy. CONTRAST:  166m OMNIPAQUE IOHEXOL 350 MG/ML SOLN COMPARISON:  Chest radiographs 02/10/2018. Chest CT 07/24/2017 and earlier. FINDINGS: Cardiovascular: Suboptimal but adequate contrast bolus timing in the pulmonary arterial tree. Mild lower lobe respiratory motion. No focal filling defect identified in the pulmonary arteries to suggest acute pulmonary embolism. No cardiomegaly or pericardial effusion. Mild thoracic aortic atherosclerosis. Four vessel arch configuration, left vertebral arises directly from the arch (normal variant). Evidence of calcified coronary artery atherosclerosis on series 5, image 156. Mediastinum/Nodes: Negative. Small mediastinal lymph nodes appear stable since 2018. Lungs/Pleura: Major airways are patent. Chronic upper lobe paraseptal emphysema. Linear left lower lobe scarring or atelectasis is new  since 2018. Stable mild thickening and nodularity along the right minor fissure which is therefore benign. A small left upper lobe or lingula pulmonary nodule on series 6, image 89 is also stable and benign. No pleural effusion or other acute pulmonary opacity. Upper Abdomen: Evidence of hepatic steatosis. Otherwise negative visible noncontrast liver, gallbladder, spleen, pancreas, adrenal glands, kidneys and stomach. Musculoskeletal: No acute osseous abnormality identified. Lower cervical spine disc and endplate degeneration. Review of the MIP images confirms the above findings. IMPRESSION: 1. Negative for acute pulmonary embolus. 2. Emphysema (ICD10-J43.9) with no acute pulmonary finding. Several small lung nodules are stable since 2018 and benign. 3. Calcified coronary artery atherosclerosis. 4. Hepatic steatosis. Electronically Signed   By: HGenevie AnnM.D.   On: 10/01/2020 18:29   MR HAND LEFT W WO CONTRAST  Result Date: 10/01/2020 CLINICAL DATA:  Dog bite. Hand pain and swelling. EXAM: MRI OF THE LEFT HAND WITHOUT AND WITH CONTRAST TECHNIQUE: Multiplanar, multisequence MR imaging of the left hand was performed before and after the administration of intravenous contrast. CONTRAST:  163mGADAVIST GADOBUTROL 1 MMOL/ML IV SOLN COMPARISON:  None. FINDINGS: Moderate degenerative changes are noted involving the hand and wrist. The most significant degenerative changes are at the first MCP joint with significant joint space narrowing, cartilage loss and subchondral cystic change. I do not see any findings suspicious for septic arthritis. There is a small open wound noted near the base of the thumb along the dorsum of the hand. There may be a small amount of air in the wound. There is moderate cellulitis with diffuse subcutaneous soft tissue swelling/edema/fluid and enhancement all  along the dorsum of the hand. There is also significant myositis mainly involving the first dorsal interosseous muscle. No findings for  pyomyositis. No findings to suggest osteomyelitis. No discrete drainable soft tissue abscess. IMPRESSION: 1. Small open wound near the base of the thumb along the dorsum of the hand. There may be a small amount of air in the wound. 2. Moderate diffuse cellulitis involving the dorsum of the hand. No discrete drainable soft tissue abscess. 3. Myositis involving the first dorsal interosseous muscle without evidence of pyomyositis. 4.  findings for septic arthritis or osteomyelitis. 5. Moderate degenerative changes involving the hand and wrist. Electronically Signed   By: Marijo Sanes M.D.   On: 10/01/2020 10:21   DG Hand Complete Left  Result Date: 09/30/2020 CLINICAL DATA:  Dog bite, wound check, swelling EXAM: LEFT HAND - COMPLETE 3+ VIEW COMPARISON:  09/29/2020 FINDINGS: Osseous mineralization normal. Mild degenerative changes at first MCP joint. Remaining joint spaces preserved. Non fused ossicle at ulnar styloid. No acute fracture, dislocation, or bone destruction. Increased soft tissue swelling of LEFT hand and wrist since prior study. IMPRESSION: Increased soft tissue swelling. No acute osseous abnormalities. Degenerative changes LEFT first MCP joint. Electronically Signed   By: Lavonia Dana M.D.   On: 09/30/2020 11:40   DG Hand Complete Left  Result Date: 09/29/2020 CLINICAL DATA:  Dog bite left thumb EXAM: LEFT HAND - COMPLETE 3+ VIEW COMPARISON:  None. FINDINGS: There is no evidence of fracture or dislocation. There is no evidence of arthropathy or other focal bone abnormality. Soft tissues are unremarkable. No radiopaque foreign body or soft tissue gas. IMPRESSION: No acute bony abnormality. Electronically Signed   By: Rolm Baptise M.D.   On: 09/29/2020 01:47   [place] LT LOWER EXTREM LTD SOFT TISSUE NON VASCULAR  Result Date: 10/01/2020 CLINICAL DATA:  Recent dog bites with possible abscess EXAM: ULTRASOUND LEFT LOWER EXTREMITY LIMITED TECHNIQUE: Ultrasound examination of the lower extremity soft  tissues was performed in the area of clinical concern. COMPARISON:  None. FINDINGS: Scanning in the area of clinical concern shows areas of soft tissue edema without focal confluent abscess formation. These appear related to the recent bite injuries. No other focal abnormality is noted. IMPRESSION: Subcutaneous edematous changes without definitive abscess formation. Electronically Signed   By: Inez Catalina M.D.   On: 10/01/2020 09:43         Discharge Exam: Vitals:   10/02/20 0502 10/02/20 0820  BP: (!) 135/91   Pulse: 77   Resp: 20   Temp: 98.5 F (36.9 C)   SpO2: 96% 94%   Vitals:   10/01/20 0456 10/01/20 2129 10/02/20 0502 10/02/20 0820  BP: 140/79 129/73 (!) 135/91   Pulse: (!) 55 (!) 58 77   Resp: _0 Temp: 98.2 F (36.8 C) 98 F (36.7 C) 98.5 F (36.9 C)   TempSrc: Oral Oral Oral   SpO2: 95% 95% 96% 94%  Weight:      Height:        General: Pt is alert, awake, not in acute distress Cardiovascular: RRR, S1/S2 +, no rubs, no gallops Respiratory: CTA bilaterally, no wheezing, no rhonchi Abdominal: Soft, NT, ND, bowel sounds + Extremities: no edema, no cyanosis;  Left hand--neurovascular intact.  Left hand cap refill <2 sec, radial and ulnar pulses intact.  Mild edema without draining wounds   The results of significant diagnostics from this hospitalization (including imaging, microbiology, ancillary and laboratory) are listed below for reference.  Significant Diagnostic Studies: CT ANGIO CHEST PE W OR WO CONTRAST  Result Date: 10/01/2020 CLINICAL DATA:  64 year old male with abnormal D-dimer. Left upper and lower extremity edema status post dog bite injury. EXAM: CT ANGIOGRAPHY CHEST WITH CONTRAST TECHNIQUE: Multidetector CT imaging of the chest was performed using the standard protocol during bolus administration of intravenous contrast. Multiplanar CT image reconstructions and MIPs were obtained to evaluate the vascular anatomy. CONTRAST:  128m OMNIPAQUE  IOHEXOL 350 MG/ML SOLN COMPARISON:  Chest radiographs 02/10/2018. Chest CT 07/24/2017 and earlier. FINDINGS: Cardiovascular: Suboptimal but adequate contrast bolus timing in the pulmonary arterial tree. Mild lower lobe respiratory motion. No focal filling defect identified in the pulmonary arteries to suggest acute pulmonary embolism. No cardiomegaly or pericardial effusion. Mild thoracic aortic atherosclerosis. Four vessel arch configuration, left vertebral arises directly from the arch (normal variant). Evidence of calcified coronary artery atherosclerosis on series 5, image 156. Mediastinum/Nodes: Negative. Small mediastinal lymph nodes appear stable since 2018. Lungs/Pleura: Major airways are patent. Chronic upper lobe paraseptal emphysema. Linear left lower lobe scarring or atelectasis is new since 2018. Stable mild thickening and nodularity along the right minor fissure which is therefore benign. A small left upper lobe or lingula pulmonary nodule on series 6, image 89 is also stable and benign. No pleural effusion or other acute pulmonary opacity. Upper Abdomen: Evidence of hepatic steatosis. Otherwise negative visible noncontrast liver, gallbladder, spleen, pancreas, adrenal glands, kidneys and stomach. Musculoskeletal: No acute osseous abnormality identified. Lower cervical spine disc and endplate degeneration. Review of the MIP images confirms the above findings. IMPRESSION: 1. Negative for acute pulmonary embolus. 2. Emphysema (ICD10-J43.9) with no acute pulmonary finding. Several small lung nodules are stable since 2018 and benign. 3. Calcified coronary artery atherosclerosis. 4. Hepatic steatosis. Electronically Signed   By: HGenevie AnnM.D.   On: 10/01/2020 18:29   MR HAND LEFT W WO CONTRAST  Result Date: 10/01/2020 CLINICAL DATA:  Dog bite. Hand pain and swelling. EXAM: MRI OF THE LEFT HAND WITHOUT AND WITH CONTRAST TECHNIQUE: Multiplanar, multisequence MR imaging of the left hand was performed before  and after the administration of intravenous contrast. CONTRAST:  154mGADAVIST GADOBUTROL 1 MMOL/ML IV SOLN COMPARISON:  None. FINDINGS: Moderate degenerative changes are noted involving the hand and wrist. The most significant degenerative changes are at the first MCP joint with significant joint space narrowing, cartilage loss and subchondral cystic change. I do not see any findings suspicious for septic arthritis. There is a small open wound noted near the base of the thumb along the dorsum of the hand. There may be a small amount of air in the wound. There is moderate cellulitis with diffuse subcutaneous soft tissue swelling/edema/fluid and enhancement all along the dorsum of the hand. There is also significant myositis mainly involving the first dorsal interosseous muscle. No findings for pyomyositis. No findings to suggest osteomyelitis. No discrete drainable soft tissue abscess. IMPRESSION: 1. Small open wound near the base of the thumb along the dorsum of the hand. There may be a small amount of air in the wound. 2. Moderate diffuse cellulitis involving the dorsum of the hand. No discrete drainable soft tissue abscess. 3. Myositis involving the first dorsal interosseous muscle without evidence of pyomyositis. 4.  findings for septic arthritis or osteomyelitis. 5. Moderate degenerative changes involving the hand and wrist. Electronically Signed   By: P.Marijo Sanes.D.   On: 10/01/2020 10:21   DG Hand Complete Left  Result Date: 09/30/2020 CLINICAL DATA:  Dog bite,  wound check, swelling EXAM: LEFT HAND - COMPLETE 3+ VIEW COMPARISON:  09/29/2020 FINDINGS: Osseous mineralization normal. Mild degenerative changes at first MCP joint. Remaining joint spaces preserved. Non fused ossicle at ulnar styloid. No acute fracture, dislocation, or bone destruction. Increased soft tissue swelling of LEFT hand and wrist since prior study. IMPRESSION: Increased soft tissue swelling. No acute osseous abnormalities.  Degenerative changes LEFT first MCP joint. Electronically Signed   By: Lavonia Dana M.D.   On: 09/30/2020 11:40   DG Hand Complete Left  Result Date: 09/29/2020 CLINICAL DATA:  Dog bite left thumb EXAM: LEFT HAND - COMPLETE 3+ VIEW COMPARISON:  None. FINDINGS: There is no evidence of fracture or dislocation. There is no evidence of arthropathy or other focal bone abnormality. Soft tissues are unremarkable. No radiopaque foreign body or soft tissue gas. IMPRESSION: No acute bony abnormality. Electronically Signed   By: Rolm Baptise M.D.   On: 09/29/2020 01:47   [place] LT LOWER EXTREM LTD SOFT TISSUE NON VASCULAR  Result Date: 10/01/2020 CLINICAL DATA:  Recent dog bites with possible abscess EXAM: ULTRASOUND LEFT LOWER EXTREMITY LIMITED TECHNIQUE: Ultrasound examination of the lower extremity soft tissues was performed in the area of clinical concern. COMPARISON:  None. FINDINGS: Scanning in the area of clinical concern shows areas of soft tissue edema without focal confluent abscess formation. These appear related to the recent bite injuries. No other focal abnormality is noted. IMPRESSION: Subcutaneous edematous changes without definitive abscess formation. Electronically Signed   By: Inez Catalina M.D.   On: 10/01/2020 09:43     Microbiology: Recent Results (from the past 240 hour(s))  Respiratory Panel by RT PCR (Flu A&B, Covid) - Nasopharyngeal Swab     Status: None   Collection Time: 09/30/20  1:25 PM   Specimen: Nasopharyngeal Swab  Result Value Ref Range Status   SARS Coronavirus 2 by RT PCR NEGATIVE NEGATIVE Final    Comment: (NOTE) SARS-CoV-2 target nucleic acids are NOT DETECTED.  The SARS-CoV-2 RNA is generally detectable in upper respiratoy specimens during the acute phase of infection. The lowest concentration of SARS-CoV-2 viral copies this assay can detect is 131 copies/mL. A negative result does not preclude SARS-Cov-2 infection and should not be used as the sole basis for  treatment or other patient management decisions. A negative result may occur with  improper specimen collection/handling, submission of specimen other than nasopharyngeal swab, presence of viral mutation(s) within the areas targeted by this assay, and inadequate number of viral copies (<131 copies/mL). A negative result must be combined with clinical observations, patient history, and epidemiological information. The expected result is Negative.  Fact Sheet for Patients:  PinkCheek.be  Fact Sheet for Healthcare Providers:  GravelBags.it  This test is no t yet approved or cleared by the [place] FDA and  has been authorized for detection and/or diagnosis of SARS-CoV-2 by FDA under an Emergency Use Authorization (EUA). This EUA will remain  in effect (meaning this test can be used) for the duration of the COVID-19 declaration under Section 564(b)(1) of the Act, 21 U.S.C. section 360bbb-3(b)(1), unless the authorization is terminated or revoked sooner.     Influenza A by PCR NEGATIVE NEGATIVE Final   Influenza B by PCR NEGATIVE NEGATIVE Final    Comment: (NOTE) The Xpert Xpress SARS-CoV-2/FLU/RSV assay is intended as an aid in  the diagnosis of influenza from Nasopharyngeal swab specimens and  should not be used as a sole basis for treatment. Nasal washings and  aspirates are unacceptable  for Xpert Xpress SARS-CoV-2/FLU/RSV  testing.  Fact Sheet for Patients: PinkCheek.be  Fact Sheet for Healthcare Providers: GravelBags.it  This test is not yet approved or cleared by the [place] FDA and  has been authorized for detection and/or diagnosis of SARS-CoV-2 by  FDA under an Emergency Use Authorization (EUA). This EUA will remain  in effect (meaning this test can be used) for the duration of the  Covid-19 declaration under Section 564(b)(1) of the Act, 21   U.S.C. section 360bbb-3(b)(1), unless the authorization is  terminated or revoked. Performed at [facility], [address], [place], [place] [address]   Culture, blood (single)     Status: None (Preliminary result)   Collection Time: 09/30/20  2:37 PM   Specimen: BLOOD LEFT ARM  Result Value Ref Range Status   Specimen Description BLOOD LEFT ARM BOTTLES DRAWN AEROBIC AND ANAEROBIC  Final   Special Requests Blood Culture adequate volume  Final   Culture   Final    NO GROWTH 2 DAYS Performed at [facility], [address], [place], [place] [address]    Report Status PENDING  Incomplete     Labs: Basic Metabolic Panel: Recent Labs  Lab 09/30/20 1406 09/30/20 1406 10/01/20 0612 10/02/20 0534  NA 133*  --  138 139  K 3.5   < > 3.6 4.2  CL 101  --  102 104  CO2 24  --  28 23  GLUCOSE 99  --  94 93  BUN 8  --  7* 9  CREATININE 0.80  --  0.79 0.87  CALCIUM 8.5*  --  8.6* 8.7*   < > = values in this interval not displayed.   Liver Function Tests: Recent Labs  Lab 09/30/20 1406 10/01/20 0612  AST 36 27  ALT 37 31  ALKPHOS 68 61  BILITOT 0.8 0.8  PROT 7.3 6.6  ALBUMIN 4.0 3.6   No results for input(s): LIPASE, AMYLASE in the last 168 hours. No results for input(s): AMMONIA in the last 168 hours. CBC: Recent Labs  Lab 09/30/20 1406 10/01/20 0612 10/02/20 0534  WBC 8.8 7.2 7.0  NEUTROABS 5.7  --   --   HGB 14.7 14.0 14.6  HCT 43.1 42.6 44.1  MCV 93.7 96.4 95.7  PLT 244 241 261   Cardiac Enzymes: No results for input(s): CKTOTAL, CKMB, CKMBINDEX, TROPONINI in the last 168 hours. BNP: Invalid input(s): POCBNP CBG: No results for input(s): GLUCAP in the last 168 hours.  Time coordinating discharge:  36 minutes  Signed:  Orson Eva, DO [facility] Pager: [phone] 10/02/2020, 9:40 AM

## 2020-10-03 ENCOUNTER — Encounter: Payer: Self-pay | Admitting: *Deleted

## 2020-10-03 ENCOUNTER — Telehealth: Payer: Self-pay | Admitting: *Deleted

## 2020-10-03 ENCOUNTER — Other Ambulatory Visit: Payer: Self-pay | Admitting: *Deleted

## 2020-10-03 NOTE — Patient Outreach (Signed)
[facility]) Care Management  10/03/2020  Trevian Hayashida [birth year] [id number]   Transition of care call/case closure   Referral received:10/02/20 Initial outreach:10/03/20 Insurance: [employer]    Subjective: Initial successful telephone call to patient's preferred number in order to complete transition of care assessment; 2 HIPAA identifiers verified. Explained purpose of call and completed transition of care assessment.  Mr. Go states that he is doing better. He reports some noted decrease in swelling at hand area, he denies increase in redness. He reports pain controlled with tylenol. He is tolerating diet, denies bowel or bladder problems.  Spouse is  assisting with his recovery.   Reviewed accessing the following [place] Benefits : He discussed  ongoing health issues of hypertension and elevated cholesterol states that condition are under control.  and says he does not need a referral to one of the [place] chronic disease management programs but agreeable to contact number  He is not sure if he has  the hospital indemnity he placed his wife [name] on the phone she verifies that that she has plan provided contact number to [employer] to file a claim , [phone] He say they  Use a [facility] outpatient pharmacy, [facility] delivery.     Objective:  Mr. Ryant  was hospitalized at [facility]  11/7-11/9/21 for Dog bite to left hand and left thigh Cellulitis  Comorbidities include: Hypertension, hyperlipidemia.  He was discharged to home on 10/02/20 without the need for home health services or DME.   Assessment:  Patient voices good understanding of all discharge instructions.  See transition of care flowsheet for assessment details.   Plan:  Reviewed hospital discharge diagnosis of Cellulits left hand , left thigh  and discharge treatment plan using hospital discharge instructions, assessing medication adherence, reviewing  problems requiring provider notification, and discussing the importance of follow up with  primary care provider and/or specialists as directed.  Reviewed  healthy lifestyle program information to receive discounted premium for  2023   Step 1: Get  your annual physical  Step 2: Complete your health assessment  Step 3:Identify your current health status and complete the corresponding action step between January 1, and July 25, 2021.    No ongoing care management needs identified so will close case to [place] Management services and route successful outreach letter with [place] Management pamphlet and 24 Hour Nurse Line Magnet to [place] [facility] clinical pool to be mailed to patient's home address.    Joylene Draft, RN, BSN  [place] Management Coordinator  [phone]- Mobile [phone]- Toll Free Main Office

## 2020-10-03 NOTE — Telephone Encounter (Signed)
Transition Care Management Follow-Up Telephone Call   Date discharged and where:10/02/2020 [facility]  How have you been since you were released from the hospital? Better, Hand still sore  Any patient concerns? No  Items Reviewed:   Meds: Yes  Allergies:Yes  Dietary Changes Reviewed:Yes  Functional Questionnaire:  Independent-I Dependent-D  ADLs:I   Dressing- I    Eating-I   Maintaining continence-I   Transferring-I   Transportation-I   Meal Prep-I   Managing Meds- I  Confirmed importance and Date/Time of follow-up visits scheduled: November 22 with Jessica at 3:45   Confirmed with patient if condition worsens to call PCP or go to the Emergency Dept. Patient was given office number and encouraged to call back with questions or concerns: Yes

## 2020-10-05 ENCOUNTER — Other Ambulatory Visit: Payer: Self-pay | Admitting: *Deleted

## 2020-10-05 NOTE — Patient Outreach (Signed)
[facility]) Care Management  10/05/2020  Aaron [name] 02/25/1956 [id number]   EMMI red Alert    Day:1  Date : 10/04/20 Red Alert Reason : question on discharge papers - Yes, unfilled prescriptions_  Yes   Outreach Attempt #1 Subjective:  Unsuccessful outreach call to patient, no answer able to leave a HIPAA compliant message for return call.   Plan If no return call will plan outreach call in the next 4 business days. Patient with successful outreach letter sent in the last 48 hours after initial successful outreach.   Joylene Draft, RN, BSN  [place] Management Coordinator  [phone]- Mobile [phone]- Toll Free Main Office

## 2020-10-06 LAB — CULTURE, BLOOD (SINGLE)
Culture: NO GROWTH
Special Requests: ADEQUATE

## 2020-10-08 ENCOUNTER — Other Ambulatory Visit: Payer: 59

## 2020-10-10 ENCOUNTER — Other Ambulatory Visit: Payer: Self-pay | Admitting: Nurse Practitioner

## 2020-10-10 ENCOUNTER — Other Ambulatory Visit: Payer: Self-pay | Admitting: *Deleted

## 2020-10-10 DIAGNOSIS — N529 Male erectile dysfunction, unspecified: Secondary | ICD-10-CM

## 2020-10-10 DIAGNOSIS — F419 Anxiety disorder, unspecified: Secondary | ICD-10-CM

## 2020-10-10 NOTE — Telephone Encounter (Signed)
Patient is requesting refill for "Alprazolam". Patient last refill was 08/24/2020 with 20 tablets to be taken as needed for anxiety. No additional refills were given. Medication pend and sent to [name], NP. Please Advise.

## 2020-10-10 NOTE — Patient Outreach (Signed)
[facility]) Care Management  10/10/2020  Aaron [name] Mar 17, 1956 [id number]   EMMI red Alert    Day:1  Date : 10/04/20 Red Alert Reason : question on discharge papers - Yes, unfilled prescriptions_  Yes   Outreach Attempt #2          Subjective:  Successful outreach call to patient , explained reason for return call. Addressed EMMI red alerts Questions on discharge papers? Patient denies having questions. Unfilled prescriptions? Yes, patient states that this has been taken care taking antibiotics as prescribed.  Patient reports managing well after recent admission due to cellulitis in hands due to dog bite, he has returned to work.  He denies any other needs or concerns at this time.  Objective: Aaron [name] was hospitalized [facility]  11/7-11/9/21 for Dog bite to left hand and left thigh Cellulitis  Comorbidities include: Hypertension, hyperlipidemia.  He was discharged to home on 10/02/20 without the need for home health servicesor DME.  Plan Will plan case closure, no further care management needs identified.    Joylene Draft, RN, BSN  [place] Management Coordinator  [phone]- Mobile [phone]- Toll Free Main Office

## 2020-10-15 ENCOUNTER — Encounter: Payer: Self-pay | Admitting: Nurse Practitioner

## 2020-10-15 ENCOUNTER — Telehealth: Payer: 59 | Admitting: Family

## 2020-10-15 DIAGNOSIS — J4540 Moderate persistent asthma, uncomplicated: Secondary | ICD-10-CM

## 2020-10-15 DIAGNOSIS — J438 Other emphysema: Secondary | ICD-10-CM | POA: Diagnosis not present

## 2020-10-15 MED ORDER — PREDNISONE 10 MG (21) PO TBPK: ORAL_TABLET | ORAL | 0 refills | Status: DC

## 2020-10-15 MED ORDER — BENZONATATE 100 MG PO CAPS: 100.0000 mg | ORAL_CAPSULE | Freq: Three times a day (TID) | ORAL | 0 refills | Status: DC | PRN

## 2020-10-15 NOTE — Progress Notes (Signed)

## 2020-10-23 ENCOUNTER — Other Ambulatory Visit: Payer: Self-pay | Admitting: Orthopedic Surgery

## 2020-10-23 ENCOUNTER — Encounter: Payer: Self-pay | Admitting: Orthopedic Surgery

## 2020-10-23 ENCOUNTER — Other Ambulatory Visit: Payer: Self-pay

## 2020-10-23 ENCOUNTER — Ambulatory Visit (INDEPENDENT_AMBULATORY_CARE_PROVIDER_SITE_OTHER): Payer: 59 | Admitting: Orthopedic Surgery

## 2020-10-23 VITALS — BP 150/92 | HR 72 | Temp 97.5°F | Ht 74.0 in | Wt 238.0 lb

## 2020-10-23 DIAGNOSIS — J4540 Moderate persistent asthma, uncomplicated: Secondary | ICD-10-CM

## 2020-10-23 DIAGNOSIS — J411 Mucopurulent chronic bronchitis: Secondary | ICD-10-CM

## 2020-10-23 MED ORDER — AZITHROMYCIN 250 MG PO TABS: ORAL_TABLET | ORAL | 0 refills | Status: DC

## 2020-10-23 MED ORDER — PREDNISONE 10 MG (21) PO TBPK: ORAL_TABLET | ORAL | 0 refills | Status: DC

## 2020-10-23 NOTE — Patient Instructions (Addendum)
Chronic Bronchitis, Adult Chronic bronchitis is long-lasting inflammation of the tubes that carry air into your lungs (bronchial tubes). This is inflammation that occurs:  On most days of the week.  For at least three months at a time.  Over a period of two years in a row. When the bronchial tubes are inflamed, they start to produce mucus. The inflammation and buildup of mucus make it more difficult to breathe. Chronic bronchitis is usually a permanent problem. It is one type of chronic obstructive pulmonary disease (COPD). People with chronic bronchitis are more likely to get frequent colds or respiratory infections. What are the causes? Chronic bronchitis most often occurs in people who:  Have chronic, severe asthma.  Have a history of smoking.  Have asthma and smoke.  Have certain lung diseases.  Have had long-term exposure to certain irritating fumes or chemicals. What are the signs or symptoms? Symptoms of chronic bronchitis may include:  A cough that brings up mucus (productive cough).  Shortness of breath.  Loud breathing (wheezing).  Chest discomfort.  Frequent (recurring) colds or respiratory infections. Certain things can trigger chronic bronchitis symptoms or make them worse, such as:  Infections.  Stopping certain medicines.  Smoking.  Exposure to chemicals. How is this diagnosed? This condition may be diagnosed based on:  Your symptoms and medical history.  A physical exam.  A chest X-ray.  Lung (pulmonary) function tests. How is this treated? There is no cure for chronic bronchitis, but treatment can help control your symptoms. Treatment may include:  Using a cool mist vaporizer or humidifier to make it easier to breathe.  Drinking more fluids. Drinking more makes your mucus thinner, which may make it easier to breathe.  Lifestyle changes, such as eating a healthier diet and getting more exercise.  Medicines, such as: ? Inhalers to improve  air flow in and out of your lungs. ? Antibiotics to treat any bacterial infections you have, such as:  Lung infection (pneumonia).  Sinus infection.  A sudden, severe (acute) episode of bronchitis.  Oxygen therapy.  Preventing infections by keeping up to date on vaccinations, including the pneumonia and flu vaccines.  Pulmonary rehabilitation. This is a program that helps you manage your breathing problems and improve your quality of life. It may last for up to 4-12 weeks and may include exercise programs, education, counseling, and treatment support. Follow these instructions at home: Medicines  Take over-the-counter and prescription medicines only as told by your health care provider.  If you were prescribed an antibiotic medicine, take it as told by your health care provider. Do not stop taking the antibiotic even if you start to feel better. Preventing infections  Get vaccinations as told by your health care provider. Make sure you get a flu shot (influenza vaccine) every year.  Wash your hands often with soap and water. If soap and water are not available, use hand sanitizer.  Avoid contact with people who have symptoms of a cold or the flu. Managing symptoms   Do not smoke, and avoid secondhand smoke. Exposure to cigarette smoke or irritating chemicals will make bronchitis worse. If you smoke and you need help quitting, ask your health care provider. Quitting smoking will help your lungs heal faster.  Use an inhaler, cool mist vaporizer, or humidifier as told by your health care provider.  Avoid pollen, dust, animal dander, molds, smoke, and other things that cause shortness of breath or wheezing attacks.  Use oxygen therapy at home as directed. Follow   instructions from your health care provider about how to use oxygen safely and take precautions to prevent fire. Make sure you never smoke while using oxygen or allow others to smoke in your home.  Do not wait to get medical  care if you have any concerning symptoms or trouble breathing. Waiting could cause permanent injury and may be life threatening. General instructions  Talk with your health care provider about what activities are safe for you and about possible exercise routines. Regular exercise is very important to help you feel better.  Drink enough fluids to keep your urine pale yellow.  Keep all follow-up visits as told by your health care provider. This is important. Contact a health care provider if:  You have coughing or shortness of breath that gets worse.  You have muscle aches.  You have chest pain.  Your mucus seems to get thicker.  Your mucus changes from clear or white to yellow, green, gray, or bloody. Get help right away if:  Your usual medicines do not stop your wheezing.  You have severe difficulty breathing. These symptoms may represent a serious problem that is an emergency. Do not wait to see if the symptoms will go away. Get medical help right away. Call your local emergency services (911 in the [place] Do not drive yourself to the hospital. Summary  Chronic bronchitis is long-lasting inflammation of the tubes that carry air into your lungs (bronchial tubes).  Chronic bronchitis is usually a permanent problem. It is one type of chronic obstructive pulmonary disease (COPD).  There is no cure for chronic bronchitis, but treatment can help control your symptoms.  Do not smoke, and avoid secondhand smoke. Exposure to cigarette smoke or irritating chemicals will make bronchitis worse. This information is not intended to replace advice given to you by your health care provider. Make sure you discuss any questions you have with your health care provider. Document Revised: 09/02/2018 Document Reviewed: 09/30/2017 Elsevier Patient Education  [place].     Please take 600 mg Mucinex twice a day for cough- drink plenty of water with it- helps thin secretions  Continue  clairtin for allergies  May continue robitussin or Delsym cough medicine- helps calm cough

## 2020-10-23 NOTE — Progress Notes (Signed)
Location:   Nature conservation officer of Service:   [place] Provider:  Windell Moulding, AGNP-C  Dewaine Oats, Carlos American, NP  [name], NP as PCP - General (Nurse Practitioner) [name], [name], NP (Inactive) as Nurse Practitioner (Vascular Surgery) [name], MD as Consulting Physician (General Surgery) Brand Males, MD as Consulting Physician (Pulmonary Disease) [name], MD as Consulting Physician (Cardiology) [name], PA-C as Physician Assistant (Dermatology)  Extended Emergency Contact Information Primary Emergency Contact: Marschke,[name] Address: [address]          [place], [place] [address] Johnnette Litter of [place] Phone: [phone] Mobile Phone: [phone] Relation: Spouse  Code Status:  Full Goals of care: Advanced Directive information Advanced Directives 09/30/2020  Does Patient Have a Medical Advance Directive? No  Would patient like information on creating a medical advance directive? No - Patient declined  Pre-existing out of facility DNR order (yellow form or pink MOST form) -     No chief complaint on file.   HPI:  Pt is a 64 y.o. male seen today for an acute visit for unresolved cough.   Past medical history includes: moderate asthma, emphysema, hypertension, AAA, fatty liver and history of smoking.  On 11/22/201 he was seen for his cough through E-visit. He was advised to rest, use tylenol or ibuprofen and use Robitussin DAC or Delsym. Prednisone 6-day dose pack and tessalon 100 mg perles were also prescribed.   After his E-visit, he did not rest and worked for three days after. He then had Thanksgiving holiday off. Claims he feels better after completing prednisone. Last night he slept well. Still smoking about 1/2 pack daily.   Today, he states his cough has improved but has not gone away. Cough is productive with clear sputum a few times daily. His wife advised him to seek medical  advice since his cough was still present. Cough varies throughout the day, but is triggered if he gets hot. Still taking Breo daily. Albuterol use varies each day.   Brought his prescription for Chantix and Wellbutrin with him today and would like to know which one to start. He was advised to start wellbutrin since Chantix is on national recall.   He was patient of [facility] 11/5-11/7 for dog bite. While there he had a CT of chest done, which confirmed emphysema. During our conversation, I recommended patient obtaining a chest x-ray, but he declined at this time since he had a CT done a few weeks ago.   Denies chest pain, fevers, muscle aches, acid reflux.   Past Medical History:  Diagnosis Date  . Abdominal aortic aneurysm [facility])    sees Dr. Einar Gip for cardiac f/u, [phone]  . Arthritis    lower back  . Asthma    followed by Dr. Berdie Ogren  . Chronic airway obstruction, not elsewhere classified   . Dyspnea    normal PFT April 2012  . External hemorrhoids without mention of complication   . GERD (gastroesophageal reflux disease)   . Gout   . H/O hiatal hernia   . Hemorrhoids   . HTN (hypertension)    hx of sees Dr. Graylin Shiver  . Hyperlipidemia   . Insomnia, unspecified   . Other abnormal blood chemistry   . Other dyspnea and respiratory abnormality   . Other malaise and fatigue   . Other specified erythematous condition(695.89)   . Other testicular dysfunction   . Prostatitis, unspecified   .  Rash 2011   left chest, biopsy 2012 Dr. Rozann Lesches, results pending  . Skin cancer   . Tobacco abuse    Past Surgical History:  Procedure Laterality Date  . ABDOMINAL AORTIC ENDOVASCULAR STENT GRAFT  09/22/2012   Procedure: ABDOMINAL AORTIC ENDOVASCULAR STENT GRAFT;  Surgeon: Mal Misty, MD;  Location: [facility];  Service: Vascular;  Laterality: N/A;  GORE; ultrasound guided.  . APPENDECTOMY    . EVALUATION UNDER ANESTHESIA WITH HEMORRHOIDECTOMY N/A 12/31/2016   Procedure:  EXAM UNDER ANESTHESIA WITH HEMORRHOIDECTOMY;  Surgeon: [name], MD;  Location: [facility];  Service: General;  Laterality: N/A;  . SKIN CANCER EXCISION    . TOE SURGERY  01/2012   joint of left great toe  . TONSILLECTOMY    . TONSILLECTOMY    . TYMPANOPLASTY    . WRIST SURGERY     left    No Known Allergies  Outpatient Encounter Medications as of 10/23/2020  Medication Sig  . albuterol (PROVENTIL HFA;VENTOLIN HFA) 108 (90 Base) MCG/ACT inhaler Inhale 2 puffs into the lungs every 6 (six) hours as needed. For cough and wheezing.  Marland Kitchen ALPRAZolam (XANAX) 0.25 MG tablet TAKE 1 TABLET BY MOUTH ONCE A DAY AS NEEDED FOR ANXIETY  . amoxicillin-clavulanate (AUGMENTIN) 875-125 MG tablet Take 1 tablet by mouth every 12 (twelve) hours.  Marland Kitchen aspirin EC 81 MG tablet Take 81 mg by mouth daily.  . benzonatate (TESSALON PERLES) 100 MG capsule Take 1 capsule (100 mg total) by mouth 3 (three) times daily as needed.  Marland Kitchen BREO ELLIPTA 200-25 MCG/INH AEPB INHALE 1 PUFF INTO LUNGS ONCE DAILY. (Patient taking differently: Inhale 1 puff into the lungs daily. )  . buPROPion (WELLBUTRIN XL) 150 MG 24 hr tablet Take one tablet by mouth once daily for 3 days, then Increase to One by mouth twice daily.  Marland Kitchen doxycycline (VIBRA-TABS) 100 MG tablet Take 1 tablet (100 mg total) by mouth every 12 (twelve) hours.  Marland Kitchen losartan (COZAAR) 50 MG tablet TAKE 1 TABLET BY MOUTH ONCE DAILY TO CONTROL BLOOD PRESSURE DX I10  . pantoprazole (PROTONIX) 40 MG tablet Take 1 tablet (40 mg total) by mouth daily.  . predniSONE (STERAPRED UNI-PAK 21 TAB) 10 MG (21) TBPK tablet Use as directed  . sildenafil (VIAGRA) 50 MG tablet TAKE 1 TABLET BY MOUTH AS NEEDED FOR ERECTILE DYSFUNCTION  . simvastatin (ZOCOR) 20 MG tablet Take one tablet by mouth once daily at bedtime.  . traMADol (ULTRAM) 50 MG tablet Take 2 tablets (100 mg total) by mouth every 6 (six) hours as needed.   No facility-administered encounter medications on file  as of 10/23/2020.    Review of Systems  Constitutional: Negative for activity change, appetite change, fatigue and fever.  HENT: Negative for congestion and sore throat.   Respiratory: Positive for cough and wheezing. Negative for shortness of breath.        History of smoking  Cardiovascular: Negative for chest pain and leg swelling.  Musculoskeletal: Negative for myalgias.  Psychiatric/Behavioral: Negative for dysphoric mood. The patient is not nervous/anxious.     Immunization History  Administered Date(s) Administered  . Influenza Split 09/20/2012  . Influenza Whole 08/30/2019  . Influenza,inj,Quad PF,6+ Mos 09/29/2013, 08/08/2014, 08/09/2015, 07/17/2016, 10/14/2017, 01/14/2019, 09/21/2020  . Influenza-Unspecified 08/24/2017  . Janssen (J&J) SARS-COV-2 Vaccination 08/04/2020  . Pneumococcal Conjugate-13 12/12/2014, 12/12/2014  . Pneumococcal Polysaccharide-23 06/30/2011  . Tdap 12/06/2013  . Zoster Recombinat (Shingrix) 06/13/2020, 09/07/2020   Pertinent  Health Maintenance Due  Topic Date Due  . COLONOSCOPY  10/03/2026  . INFLUENZA VACCINE  Completed   Fall Risk  09/21/2020 03/20/2020 09/19/2019 02/17/2019 01/14/2019  Falls in the past year? 0 - 0 0 0  Number falls in past yr: 0 0 0 0 0  Injury with Fall? 0 0 0 0 0   Functional Status Survey:    There were no vitals filed for this visit. There is no height or weight on file to calculate BMI. Physical Exam Vitals reviewed.  Constitutional:      General: He is not in acute distress.    Appearance: Normal appearance. He is normal weight.     Comments: Smells of cigarette smoke  HENT:     Head: Normocephalic.     Right Ear: Tympanic membrane normal.     Left Ear: Tympanic membrane normal.     Nose: Nose normal.     Mouth/Throat:     Mouth: Mucous membranes are moist.     Pharynx: No posterior oropharyngeal erythema.  Eyes:     General:        Right eye: No discharge.        Left eye: No discharge.   Cardiovascular:     Rate and Rhythm: Normal rate and regular rhythm.     Pulses: Normal pulses.     Heart sounds: Normal heart sounds. No murmur heard.   Pulmonary:     Effort: Pulmonary effort is normal. No respiratory distress.     Breath sounds: Examination of the left-upper field reveals rhonchi. Rhonchi present. No wheezing.     Comments: Rhonchi resolved after patient coughed Abdominal:     General: Bowel sounds are normal.     Palpations: Abdomen is soft.  Musculoskeletal:     Right lower leg: No edema.     Left lower leg: No edema.  Lymphadenopathy:     Cervical: No cervical adenopathy.  Skin:    General: Skin is warm and dry.     Capillary Refill: Capillary refill takes less than 2 seconds.  Neurological:     General: No focal deficit present.     Mental Status: He is alert and oriented to person, place, and time. Mental status is at baseline.  Psychiatric:        Mood and Affect: Mood normal.        Behavior: Behavior normal.        Thought Content: Thought content normal.        Judgment: Judgment normal.     Labs reviewed: Recent Labs    09/30/20 1406 10/01/20 0612 10/02/20 0534  NA 133* 138 139  K 3.5 3.6 4.2  CL 101 102 104  CO2 24 28 23   GLUCOSE 99 94 93  BUN 8 7* 9  CREATININE 0.80 0.79 0.87  CALCIUM 8.5* 8.6* 8.7*   Recent Labs    09/18/20 0805 09/30/20 1406 10/01/20 0612  AST 25 36 27  ALT 28 37 31  ALKPHOS  --  68 61  BILITOT 0.7 0.8 0.8  PROT 6.9 7.3 6.6  ALBUMIN  --  4.0 3.6   Recent Labs    06/13/20 1215 06/13/20 1215 09/18/20 0805 09/18/20 0805 09/30/20 1406 10/01/20 0612 10/02/20 0534  WBC 7.3   < > 6.5   < > 8.8 7.2 7.0  NEUTROABS 4,460  --  3,920  --  5.7  --   --   HGB 15.1   < > 15.6   < > 14.7  14.0 14.6  HCT 45.0   < > 45.1   < > 43.1 42.6 44.1  MCV 95.1   < > 94.0   < > 93.7 96.4 95.7  PLT 260   < > 285   < > 244 241 261   < > = values in this interval not displayed.   Lab Results  Component Value Date   TSH  2.24 12/24/2016   Lab Results  Component Value Date   HGBA1C 5.4 09/18/2020   Lab Results  Component Value Date   CHOL 163 09/18/2020   HDL 47 09/18/2020   LDLCALC 95 09/18/2020   TRIG 116 09/18/2020   CHOLHDL 3.5 09/18/2020    Significant Diagnostic Results in last 30 days:  CT ANGIO CHEST PE W OR WO CONTRAST  Result Date: 10/01/2020 CLINICAL DATA:  64 year old male with abnormal D-dimer. Left upper and lower extremity edema status post dog bite injury. EXAM: CT ANGIOGRAPHY CHEST WITH CONTRAST TECHNIQUE: Multidetector CT imaging of the chest was performed using the standard protocol during bolus administration of intravenous contrast. Multiplanar CT image reconstructions and MIPs were obtained to evaluate the vascular anatomy. CONTRAST:  158mL OMNIPAQUE IOHEXOL 350 MG/ML SOLN COMPARISON:  Chest radiographs 02/10/2018. Chest CT 07/24/2017 and earlier. FINDINGS: Cardiovascular: Suboptimal but adequate contrast bolus timing in the pulmonary arterial tree. Mild lower lobe respiratory motion. No focal filling defect identified in the pulmonary arteries to suggest acute pulmonary embolism. No cardiomegaly or pericardial effusion. Mild thoracic aortic atherosclerosis. Four vessel arch configuration, left vertebral arises directly from the arch (normal variant). Evidence of calcified coronary artery atherosclerosis on series 5, image 156. Mediastinum/Nodes: Negative. Small mediastinal lymph nodes appear stable since 2018. Lungs/Pleura: Major airways are patent. Chronic upper lobe paraseptal emphysema. Linear left lower lobe scarring or atelectasis is new since 2018. Stable mild thickening and nodularity along the right minor fissure which is therefore benign. A small left upper lobe or lingula pulmonary nodule on series 6, image 89 is also stable and benign. No pleural effusion or other acute pulmonary opacity. Upper Abdomen: Evidence of hepatic steatosis. Otherwise negative visible noncontrast liver,  gallbladder, spleen, pancreas, adrenal glands, kidneys and stomach. Musculoskeletal: No acute osseous abnormality identified. Lower cervical spine disc and endplate degeneration. Review of the MIP images confirms the above findings. IMPRESSION: 1. Negative for acute pulmonary embolus. 2. Emphysema (ICD10-J43.9) with no acute pulmonary finding. Several small lung nodules are stable since 2018 and benign. 3. Calcified coronary artery atherosclerosis. 4. Hepatic steatosis. Electronically Signed   By: Genevie Ann M.D.   On: 10/01/2020 18:29   MR HAND LEFT W WO CONTRAST  Result Date: 10/01/2020 CLINICAL DATA:  Dog bite. Hand pain and swelling. EXAM: MRI OF THE LEFT HAND WITHOUT AND WITH CONTRAST TECHNIQUE: Multiplanar, multisequence MR imaging of the left hand was performed before and after the administration of intravenous contrast. CONTRAST:  26mL GADAVIST GADOBUTROL 1 MMOL/ML IV SOLN COMPARISON:  None. FINDINGS: Moderate degenerative changes are noted involving the hand and wrist. The most significant degenerative changes are at the first MCP joint with significant joint space narrowing, cartilage loss and subchondral cystic change. I do not see any findings suspicious for septic arthritis. There is a small open wound noted near the base of the thumb along the dorsum of the hand. There may be a small amount of air in the wound. There is moderate cellulitis with diffuse subcutaneous soft tissue swelling/edema/fluid and enhancement all along the dorsum of the hand. There is also significant  myositis mainly involving the first dorsal interosseous muscle. No findings for pyomyositis. No findings to suggest osteomyelitis. No discrete drainable soft tissue abscess. IMPRESSION: 1. Small open wound near the base of the thumb along the dorsum of the hand. There may be a small amount of air in the wound. 2. Moderate diffuse cellulitis involving the dorsum of the hand. No discrete drainable soft tissue abscess. 3. Myositis  involving the first dorsal interosseous muscle without evidence of pyomyositis. 4.  findings for septic arthritis or osteomyelitis. 5. Moderate degenerative changes involving the hand and wrist. Electronically Signed   By: Marijo Sanes M.D.   On: 10/01/2020 10:21   MR FEMUR LEFT WO CONTRAST  Result Date: 10/02/2020 CLINICAL DATA:  Left thigh dog bite. Concern for soft tissue infection EXAM: MR OF THE LEFT FEMUR WITHOUT CONTRAST TECHNIQUE: Multiplanar, multisequence MR imaging of the left femur was performed. No intravenous contrast was administered. COMPARISON:  Ultrasound 10/01/2020 FINDINGS: Bones/Joint/Cartilage No acute fracture. No dislocation. No femoral head avascular necrosis. No bony erosion or periostitis. The left hip and left knee are intact without significant arthropathy. No hip or knee joint effusion. Incidental note of a well-circumscribed ovoid T2 hyperintense cortically based lesion at the medial aspect of the distal left femoral metadiaphysis measuring 1.3 x 0.9 x 0.9 cm (series 17, image 71). Lesion has a well defined low T1/T2 signal intensity rim. No signal changes within the adjacent marrow or cortex. No cortical breakthrough or soft tissue component. Ligaments Intact. Muscles and Tendons There is irregularity involving a the superficial aspect of the distal left vastus lateralis muscle with (series 17, image 63) compatible with a laceration/partial muscle tear. Surrounding intramuscular edema, which may be posttraumatic or reflect a localized myositis. No intramuscular fluid collection. Remaining musculature is normal in bulk and signal intensity. Intact tendinous structures. Soft tissues There is prominent subcutaneous edema within the anterolateral soft tissues of the mid to distal left thigh. A small amount of fluid layers over the superficial investing fascia of the anterior compartment. No deep fascial fluid or edema. No organized soft tissue fluid collection. IMPRESSION: 1.  Superficial laceration/partial muscle tear of the distal left vastus lateralis muscle compatible with penetrating trauma. Surrounding intramuscular edema, which may be posttraumatic or reflect a localized myositis. No intramuscular fluid collection. 2. Prominent subcutaneous edema within the anterolateral soft tissues of the mid to distal left thigh. A small amount of fluid layers over the superficial investing fascia of the anterior compartment. No organized soft tissue fluid collection. 3. No acute osseous abnormality or evidence of osteomyelitis. 4. Incidental note of a nonaggressive appearing 1.3 x 0.9 x 0.9 cm cortically based lesion at the medial aspect of the distal left femoral metadiaphysis. Findings are most consistent with a benign fibro-osseous lesion. Consider radiographic follow-up of the left knee in 6 months to ensure stability. Electronically Signed   By: Davina Poke D.O.   On: 10/02/2020 09:55   DG Hand Complete Left  Result Date: 09/30/2020 CLINICAL DATA:  Dog bite, wound check, swelling EXAM: LEFT HAND - COMPLETE 3+ VIEW COMPARISON:  09/29/2020 FINDINGS: Osseous mineralization normal. Mild degenerative changes at first MCP joint. Remaining joint spaces preserved. Non fused ossicle at ulnar styloid. No acute fracture, dislocation, or bone destruction. Increased soft tissue swelling of LEFT hand and wrist since prior study. IMPRESSION: Increased soft tissue swelling. No acute osseous abnormalities. Degenerative changes LEFT first MCP joint. Electronically Signed   By: Lavonia Dana M.D.   On: 09/30/2020 11:40  DG Hand Complete Left  Result Date: 09/29/2020 CLINICAL DATA:  Dog bite left thumb EXAM: LEFT HAND - COMPLETE 3+ VIEW COMPARISON:  None. FINDINGS: There is no evidence of fracture or dislocation. There is no evidence of arthropathy or other focal bone abnormality. Soft tissues are unremarkable. No radiopaque foreign body or soft tissue gas. IMPRESSION: No acute bony abnormality.  Electronically Signed   By: Rolm Baptise M.D.   On: 09/29/2020 01:47   [place] LT LOWER EXTREM LTD SOFT TISSUE NON VASCULAR  Result Date: 10/01/2020 CLINICAL DATA:  Recent dog bites with possible abscess EXAM: ULTRASOUND LEFT LOWER EXTREMITY LIMITED TECHNIQUE: Ultrasound examination of the lower extremity soft tissues was performed in the area of clinical concern. COMPARISON:  None. FINDINGS: Scanning in the area of clinical concern shows areas of soft tissue edema without focal confluent abscess formation. These appear related to the recent bite injuries. No other focal abnormality is noted. IMPRESSION: Subcutaneous edematous changes without definitive abscess formation. Electronically Signed   By: Inez Catalina M.D.   On: 10/01/2020 09:43    Assessment/Plan 1. Moderate persistent asthma without complication - ongoing, albuterol use varies with wheezing - wheezing absent on exam - recommend smoking cessation - recommend starting Wellbutrin prescription to aide in smoking cessation - predniSONE (STERAPRED UNI-PAK 21 TAB) 10 MG (21) TBPK tablet; Use as directed  Dispense: 21 tablet; Refill: 0  2. Chronic bronchitis with productive mucopurulent cough (HCC) - CT of chest done 10/01/20 with diagnosis of emphysema - he continues to smoke 1/2 pack daily - suspect he is developing chronic bronchitis since he does not have any other cold symptoms, he refuses to have chest x-ray done at this time - Left upper lobe with rhonchi on exam, will start antibiotic and redo prednisone dose pack in case he is developing upper respiratory infection - recommend mucinex 600 mg BID to thin secretions - mat continue using robitussin or delsym for cough - continue Claritin and Protonix - patient advised to call PCP if symptoms worsen - azithromycin (ZITHROMAX) 250 MG tablet; Take 2 pills today, and then one a day until complete  Dispense: 6 tablet; Refill: 0    Family/ staff Communication: Plan discussed with  patient  Labs/tests ordered:  none

## 2020-11-08 ENCOUNTER — Other Ambulatory Visit: Payer: Self-pay | Admitting: Nurse Practitioner

## 2020-11-08 DIAGNOSIS — F419 Anxiety disorder, unspecified: Secondary | ICD-10-CM

## 2020-11-08 NOTE — Telephone Encounter (Signed)
RX last filled on 10/10/2020, treatment agreement on file from 09/21/2020

## 2020-11-15 ENCOUNTER — Telehealth: Payer: Self-pay

## 2020-11-15 NOTE — Telephone Encounter (Signed)
Called patient and gave him this information. He was appreciative.

## 2020-11-15 NOTE — Telephone Encounter (Signed)
Patient had a J&J vaccine in 07/2020 and he is asking about getting his booster. I tried to give him the [facility] #, but he said he was driving. He stated his wife worked for [employer] and could look it up, but he was wanting advice about getting the booster and which booster to get.

## 2020-11-20 DIAGNOSIS — Z03818 Encounter for observation for suspected exposure to other biological agents ruled out: Secondary | ICD-10-CM | POA: Diagnosis not present

## 2020-11-22 ENCOUNTER — Telehealth (INDEPENDENT_AMBULATORY_CARE_PROVIDER_SITE_OTHER): Payer: 59 | Admitting: Family

## 2020-11-22 ENCOUNTER — Encounter: Payer: Self-pay | Admitting: Family

## 2020-11-22 ENCOUNTER — Other Ambulatory Visit: Payer: Self-pay

## 2020-11-22 ENCOUNTER — Other Ambulatory Visit: Payer: Self-pay | Admitting: Nurse Practitioner

## 2020-11-22 DIAGNOSIS — I10 Essential (primary) hypertension: Secondary | ICD-10-CM

## 2020-11-22 DIAGNOSIS — J111 Influenza due to unidentified influenza virus with other respiratory manifestations: Secondary | ICD-10-CM | POA: Diagnosis not present

## 2020-11-22 MED ORDER — VITAMIN C-ROSE HIPS 500 MG PO TABS: 500.0000 mg | ORAL_TABLET | Freq: Every day | ORAL | 0 refills | Status: AC

## 2020-11-22 MED ORDER — OSELTAMIVIR PHOSPHATE 75 MG PO CAPS: 75.0000 mg | ORAL_CAPSULE | Freq: Two times a day (BID) | ORAL | 0 refills | Status: AC

## 2020-11-22 MED ORDER — ZINC GLUCONATE 50 MG PO TABS: 50.0000 mg | ORAL_TABLET | Freq: Every day | ORAL | 0 refills | Status: AC

## 2020-11-22 NOTE — Progress Notes (Signed)
This service is provided via telemedicine  No vital signs collected/recorded due to the encounter was a telemedicine visit.   Location of patient (ex: home, work): Home.  Patient consents to a telephone visit: Yes.  Location of the provider (ex: office, home):  [employer]  Name of any referring provider: [name], NP   Names of all persons participating in the telemedicine service and their role in the encounter:  Patient, Wife Mrs.Brandon Melnick, RMA, Luanne Krzyzanowski, [place], NP.    Time spent on call: 8 minutes spent on the phone with Medical Assistant.  Provider: Antonie Borjon FNP-C  [name], NP  [name], NP as PCP - General (Nurse Practitioner) [name], [name], NP (Inactive) as Nurse Practitioner (Vascular Surgery) [name], MD as Consulting Physician (General Surgery) Brand Males, MD as Consulting Physician (Pulmonary Disease) [name], MD as Consulting Physician (Cardiology) Starlyn Skeans as Physician Assistant (Dermatology)  Extended Emergency Contact Information Primary Emergency Contact: Mikhail,[name] Address: [address]          [place], [place] [address] Johnnette Litter of [place] Phone: [phone] Mobile Phone: [phone] Relation: Spouse  Code Status: Full Code  Goals of care: Advanced Directive information Advanced Directives 11/22/2020  Does Patient Have a Medical Advance Directive? No  Would patient like information on creating a medical advance directive? No - Patient declined  Pre-existing out of facility DNR order (yellow form or pink MOST form) -     Chief Complaint  Patient presents with  . Acute Visit    Wife positive for Flu. Patient has cough, fever, congestion, body aches, and fatigue x 5 days ago.    HPI:  Pt is a 64 y.o. male seen today for an acute visit for evaluation of cough,fever,congestion,body aches and fatigue since 11/17/2020  though symptoms have worsen in the past few days.Had fever x 1 day.He was recently tested for COVID-19 which was negative.Has been taking tylenol,claratin,mucinex and Robitussin.wife has similar symptoms went to her doctor yesterday.she tested positive for Influenza type A.Two grandchildren and daughter came over to visit and all of them have also been treated for influenza.Not sure which type.Wife states when he lies down he sounds like he is wheezing.No shortness of breath,chest tightness,chest pain or palpitation.  He has had no loss of taste or smell.Appetite is not good but has been drinking soups.  He request script for Tamiflu. Had Influenza vaccine 09/21/2020 and one dose of COVID-19 vaccine 08/04/2020.    Past Medical History:  Diagnosis Date  . Abdominal aortic aneurysm [facility])    sees Dr. Einar Gip for cardiac f/u, [phone]  . Arthritis    lower back  . Asthma    followed by Dr. Berdie Ogren  . Chronic airway obstruction, not elsewhere classified   . Dyspnea    normal PFT April 2012  . External hemorrhoids without mention of complication   . GERD (gastroesophageal reflux disease)   . Gout   . H/O hiatal hernia   . Hemorrhoids   . HTN (hypertension)    hx of sees Dr. Graylin Shiver  . Hyperlipidemia   . Insomnia, unspecified   . Other abnormal blood chemistry   . Other dyspnea and respiratory abnormality   . Other malaise and fatigue   . Other specified erythematous condition(695.89)   . Other testicular dysfunction   . Prostatitis, unspecified   . Rash 2011   left chest, biopsy 2012 Dr. Rozann Lesches, results pending  .  Skin cancer   . Tobacco abuse    Past Surgical History:  Procedure Laterality Date  . ABDOMINAL AORTIC ENDOVASCULAR STENT GRAFT  09/22/2012   Procedure: ABDOMINAL AORTIC ENDOVASCULAR STENT GRAFT;  Surgeon: Mal Misty, MD;  Location: [place];  Service: Vascular;  Laterality: N/A;  GORE; ultrasound guided.  . APPENDECTOMY    . EVALUATION UNDER ANESTHESIA WITH  HEMORRHOIDECTOMY N/A 12/31/2016   Procedure: EXAM UNDER ANESTHESIA WITH HEMORRHOIDECTOMY;  Surgeon: [name], MD;  Location: [facility];  Service: General;  Laterality: N/A;  . SKIN CANCER EXCISION    . TOE SURGERY  01/2012   joint of left great toe  . TONSILLECTOMY    . TONSILLECTOMY    . TYMPANOPLASTY    . WRIST SURGERY     left    No Known Allergies  Outpatient Encounter Medications as of 11/22/2020  Medication Sig  . albuterol (PROVENTIL HFA;VENTOLIN HFA) 108 (90 Base) MCG/ACT inhaler Inhale 2 puffs into the lungs every 6 (six) hours as needed. For cough and wheezing.  Marland Kitchen ALPRAZolam (XANAX) 0.25 MG tablet TAKE 1 TABLET BY MOUTH ONCE A DAY AS NEEDED FOR ANXIETY  . aspirin EC 81 MG tablet Take 81 mg by mouth daily.  Marland Kitchen BREO ELLIPTA 200-25 MCG/INH AEPB INHALE 1 PUFF INTO LUNGS ONCE DAILY.  Marland Kitchen buPROPion (WELLBUTRIN XL) 150 MG 24 hr tablet Take one tablet by mouth once daily for 3 days, then Increase to One by mouth twice daily.  Marland Kitchen losartan (COZAAR) 50 MG tablet TAKE 1 TABLET BY MOUTH ONCE DAILY TO CONTROL BLOOD PRESSURE DX I10  . pantoprazole (PROTONIX) 40 MG tablet Take 1 tablet (40 mg total) by mouth daily.  . sildenafil (VIAGRA) 50 MG tablet TAKE 1 TABLET BY MOUTH AS NEEDED FOR ERECTILE DYSFUNCTION  . simvastatin (ZOCOR) 20 MG tablet Take one tablet by mouth once daily at bedtime.  . [DISCONTINUED] azithromycin (ZITHROMAX) 250 MG tablet Take 2 pills today, and then one a day until complete  . [DISCONTINUED] benzonatate (TESSALON PERLES) 100 MG capsule Take 1 capsule (100 mg total) by mouth 3 (three) times daily as needed.  . [DISCONTINUED] predniSONE (STERAPRED UNI-PAK 21 TAB) 10 MG (21) TBPK tablet Use as directed  . [DISCONTINUED] traMADol (ULTRAM) 50 MG tablet Take 2 tablets (100 mg total) by mouth every 6 (six) hours as needed.   No facility-administered encounter medications on file as of 11/22/2020.    Review of Systems  Constitutional: Positive for  fatigue. Negative for chills.       Had high temp x 1 day but none since then.   HENT: Positive for congestion and rhinorrhea. Negative for postnasal drip, sinus pressure, sinus pain, sneezing and sore throat.   Eyes: Negative for discharge, redness and itching.  Respiratory: Positive for cough. Negative for chest tightness and shortness of breath.   Cardiovascular: Negative for chest pain, palpitations and leg swelling.  Gastrointestinal: Negative for abdominal distention, abdominal pain, diarrhea, nausea and vomiting.  Skin: Negative for color change, pallor and rash.  Neurological: Negative for dizziness, light-headedness and headaches.    Immunization History  Administered Date(s) Administered  . Influenza Split 09/20/2012  . Influenza Whole 08/30/2019  . Influenza,inj,Quad PF,6+ Mos 09/29/2013, 08/08/2014, 08/09/2015, 07/17/2016, 10/14/2017, 01/14/2019, 09/21/2020  . Influenza-Unspecified 08/24/2017  . Janssen (J&J) SARS-COV-2 Vaccination 08/04/2020  . Pneumococcal Conjugate-13 12/12/2014, 12/12/2014  . Pneumococcal Polysaccharide-23 06/30/2011  . Tdap 12/06/2013  . Zoster Recombinat (Shingrix) 06/13/2020, 09/07/2020   Pertinent  Health Maintenance Due  Topic Date Due  . COLONOSCOPY (Pts 45-69yrs Insurance coverage will need to be confirmed)  10/03/2026  . INFLUENZA VACCINE  Completed   Fall Risk  11/22/2020 10/23/2020 09/21/2020 03/20/2020 09/19/2019  Falls in the past year? 0 1 0 - 0  Number falls in past yr: 0 0 0 0 0  Injury with Fall? 0 0 0 0 0   Functional Status Survey:    There were no vitals filed for this visit. There is no height or weight on file to calculate BMI. Physical Exam Vitals reviewed.  Constitutional:      General: He is not in acute distress.    Appearance: He is not ill-appearing.  HENT:     Mouth/Throat:     Mouth: Mucous membranes are moist.  Pulmonary:     Effort: Pulmonary effort is normal. No respiratory distress.  Neurological:      Mental Status: He is alert and oriented to person, place, and time.  Psychiatric:        Mood and Affect: Mood normal.        Behavior: Behavior normal.        Thought Content: Thought content normal.        Judgment: Judgment normal.     Labs reviewed: Recent Labs    09/30/20 1406 10/01/20 0612 10/02/20 0534  NA 133* 138 139  K 3.5 3.6 4.2  CL 101 102 104  CO2 24 28 23   GLUCOSE 99 94 93  BUN 8 7* 9  CREATININE 0.80 0.79 0.87  CALCIUM 8.5* 8.6* 8.7*   Recent Labs    09/18/20 0805 09/30/20 1406 10/01/20 0612  AST 25 36 27  ALT 28 37 31  ALKPHOS  --  68 61  BILITOT 0.7 0.8 0.8  PROT 6.9 7.3 6.6  ALBUMIN  --  4.0 3.6   Recent Labs    06/13/20 1215 09/18/20 0805 09/30/20 1406 10/01/20 0612 10/02/20 0534  WBC 7.3 6.5 8.8 7.2 7.0  NEUTROABS 4,460 3,920 5.7  --   --   HGB 15.1 15.6 14.7 14.0 14.6  HCT 45.0 45.1 43.1 42.6 44.1  MCV 95.1 94.0 93.7 96.4 95.7  PLT 260 285 244 241 261   Lab Results  Component Value Date   TSH 2.24 12/24/2016   Lab Results  Component Value Date   HGBA1C 5.4 09/18/2020   Lab Results  Component Value Date   CHOL 163 09/18/2020   HDL 47 09/18/2020   LDLCALC 95 09/18/2020   TRIG 116 09/18/2020   CHOLHDL 3.5 09/18/2020    Significant Diagnostic Results in last 30 days:  No results found.  Assessment/Plan   Influenza Had high temp x 1 day but none today. Wife tested positive for influenza Type A Also 2 grandchildren and daughter who were visiting are being treated for Flu.Will treated with Tamiflu due to close exposure. Side effects discussed.  - Advised to increase fluid intake. - continue on loratadine 10 mg tablet daily for runny nose  - continue on Tylenol as needed for body aches or fever  - continue on Robitussin or mucinex for cough  - vitamin C 500 mg tablet twice daily and Zinc 50 mg tablet daily x 14 days.  - increase fruits in diet  - Advised to get plenty of rest  - continue with hand hygiene  - oseltamivir  (TAMIFLU) 75 MG capsule; Take 1 capsule (75 mg total) by mouth 2 (two) times daily for 7 days.  Dispense: 14 capsule;  Refill: 0 - Advised to Notify provider or go to ED if symptoms worsen or develops shortness of breath,wheezing or chest pain    Family/ staff Communication: Reviewed plan of care with patient and wife verbalized understanding,  Labs/tests ordered: None   Next Appointment: As needed if symptoms worsen or fail to improve.   I connected with  Chesley Mires on 11/22/20 by a video enabled telemedicine application and verified that I am speaking with the correct person using two identifiers.   I discussed the limitations of evaluation and management by telemedicine. The patient expressed understanding and agreed to proceed.  Spent 15 minutes of face to face on video with patient    Sandrea Hughs, NP

## 2020-11-22 NOTE — Patient Instructions (Addendum)
-  Increase fluid intake. - continue on loratadine 10 mg tablet daily for runny nose  - continue on Tylenol as needed for body aches or fever  - continue on Robitussin or mucinex for cough  - vitamin C 500 mg tablet twice daily and Zinc 50 mg tablet daily x 14 days.  - Increase fruits in diet  - Get plenty of rest  - continue with hand hygiene - Notify provider or go to ED if symptoms worsen or develops shortness of breath,wheezing or chest pain   Influenza, Adult Influenza is also called "the flu." It is an infection in the lungs, nose, and throat (respiratory tract). It is caused by a virus. The flu causes symptoms that are similar to symptoms of a cold. It also causes a high fever and body aches. The flu spreads easily from person to person (is contagious). Getting a flu shot (influenza vaccination) every year is the best way to prevent the flu. What are the causes? This condition is caused by the influenza virus. You can get the virus by:  Breathing in droplets that are in the air from the cough or sneeze of a person who has the virus.  Touching something that has the virus on it (is contaminated) and then touching your mouth, nose, or eyes. What increases the risk? Certain things may make you more likely to get the flu. These include:  Not washing your hands often.  Having close contact with many people during cold and flu season.  Touching your mouth, eyes, or nose without first washing your hands.  Not getting a flu shot every year. You may have a higher risk for the flu, along with serious problems such as a lung infection (pneumonia), if you:  Are older than 65.  Are pregnant.  Have a weakened disease-fighting system (immune system) because of a disease or taking certain medicines.  Have a long-term (chronic) illness, such as: ? Heart, kidney, or lung disease. ? Diabetes. ? Asthma.  Have a liver disorder.  Are very overweight (morbidly obese).  Have anemia. This  is a condition that affects your red blood cells. What are the signs or symptoms? Symptoms usually begin suddenly and last 4-14 days. They may include:  Fever and chills.  Headaches, body aches, or muscle aches.  Sore throat.  Cough.  Runny or stuffy (congested) nose.  Chest discomfort.  Not wanting to eat as much as normal (poor appetite).  Weakness or feeling tired (fatigue).  Dizziness.  Feeling sick to your stomach (nauseous) or throwing up (vomiting). How is this treated? If the flu is found early, you can be treated with medicine that can help reduce how bad the illness is and how long it lasts (antiviral medicine). This may be given by mouth (orally) or through an IV tube. Taking care of yourself at home can help your symptoms get better. Your doctor may suggest:  Taking over-the-counter medicines.  Drinking plenty of fluids. The flu often goes away on its own. If you have very bad symptoms or other problems, you may be treated in a hospital. Follow these instructions at home:     Activity  Rest as needed. Get plenty of sleep.  Stay home from work or school as told by your doctor. ? Do not leave home until you do not have a fever for 24 hours without taking medicine. ? Leave home only to visit your doctor. Eating and drinking  Take an ORS (oral rehydration solution). This is  a drink that is sold at pharmacies and stores.  Drink enough fluid to keep your pee (urine) pale yellow.  Drink clear fluids in small amounts as you are able. Clear fluids include: ? Water. ? Ice chips. ? Fruit juice that has water added (diluted fruit juice). ? Low-calorie sports drinks.  Eat bland, easy-to-digest foods in small amounts as you are able. These foods include: ? Bananas. ? Applesauce. ? Rice. ? Lean meats. ? Toast. ? Crackers.  Do not eat or drink: ? Fluids that have a lot of sugar or caffeine. ? Alcohol. ? Spicy or fatty foods. General instructions  Take  over-the-counter and prescription medicines only as told by your doctor.  Use a cool mist humidifier to add moisture to the air in your home. This can make it easier for you to breathe.  Cover your mouth and nose when you cough or sneeze.  Wash your hands with soap and water often, especially after you cough or sneeze. If you cannot use soap and water, use alcohol-based hand sanitizer.  Keep all follow-up visits as told by your doctor. This is important. How is this prevented?   Get a flu shot every year. You may get the flu shot in late summer, fall, or winter. Ask your doctor when you should get your flu shot.  Avoid contact with people who are sick during fall and winter (cold and flu season). Contact a doctor if:  You get new symptoms.  You have: ? Chest pain. ? Watery poop (diarrhea). ? A fever.  Your cough gets worse.  You start to have more mucus.  You feel sick to your stomach.  You throw up. Get help right away if you:  Have shortness of breath.  Have trouble breathing.  Have skin or nails that turn a bluish color.  Have very bad pain or stiffness in your neck.  Get a sudden headache.  Get sudden pain in your face or ear.  Cannot eat or drink without throwing up. Summary  Influenza ("the flu") is an infection in the lungs, nose, and throat. It is caused by a virus.  Take over-the-counter and prescription medicines only as told by your doctor.  Getting a flu shot every year is the best way to avoid getting the flu. This information is not intended to replace advice given to you by your health care provider. Make sure you discuss any questions you have with your health care provider. Document Revised: 04/28/2018 Document Reviewed: 04/28/2018 Elsevier Patient Education  2020 [employer].

## 2020-11-26 ENCOUNTER — Other Ambulatory Visit (HOSPITAL_COMMUNITY): Payer: Self-pay | Admitting: Nurse Practitioner

## 2020-11-26 ENCOUNTER — Other Ambulatory Visit: Payer: Self-pay | Admitting: Nurse Practitioner

## 2020-11-26 DIAGNOSIS — I10 Essential (primary) hypertension: Secondary | ICD-10-CM

## 2020-11-27 DIAGNOSIS — Z20822 Contact with and (suspected) exposure to covid-19: Secondary | ICD-10-CM | POA: Diagnosis not present

## 2020-11-29 ENCOUNTER — Other Ambulatory Visit: Payer: Self-pay | Admitting: Nurse Practitioner

## 2020-11-29 ENCOUNTER — Other Ambulatory Visit (HOSPITAL_COMMUNITY): Payer: Self-pay | Admitting: Nurse Practitioner

## 2020-11-29 DIAGNOSIS — I10 Essential (primary) hypertension: Secondary | ICD-10-CM

## 2020-12-08 ENCOUNTER — Other Ambulatory Visit: Payer: Self-pay | Admitting: Nurse Practitioner

## 2020-12-08 DIAGNOSIS — F419 Anxiety disorder, unspecified: Secondary | ICD-10-CM

## 2020-12-10 ENCOUNTER — Other Ambulatory Visit: Payer: Self-pay | Admitting: Nurse Practitioner

## 2020-12-10 DIAGNOSIS — E782 Mixed hyperlipidemia: Secondary | ICD-10-CM

## 2020-12-10 DIAGNOSIS — K219 Gastro-esophageal reflux disease without esophagitis: Secondary | ICD-10-CM

## 2020-12-10 NOTE — Telephone Encounter (Signed)
Pharmacy requested refill Epic LR: 11/08/2020 Contract on [employer] [facility] and sent to [facility] for approval.

## 2020-12-26 ENCOUNTER — Other Ambulatory Visit: Payer: Self-pay | Admitting: Nurse Practitioner

## 2020-12-26 DIAGNOSIS — N529 Male erectile dysfunction, unspecified: Secondary | ICD-10-CM

## 2021-01-26 DIAGNOSIS — I10 Essential (primary) hypertension: Secondary | ICD-10-CM | POA: Diagnosis not present

## 2021-01-26 DIAGNOSIS — H6123 Impacted cerumen, bilateral: Secondary | ICD-10-CM | POA: Diagnosis not present

## 2021-02-04 DIAGNOSIS — M542 Cervicalgia: Secondary | ICD-10-CM | POA: Diagnosis not present

## 2021-02-04 DIAGNOSIS — M9902 Segmental and somatic dysfunction of thoracic region: Secondary | ICD-10-CM | POA: Diagnosis not present

## 2021-02-04 DIAGNOSIS — M9901 Segmental and somatic dysfunction of cervical region: Secondary | ICD-10-CM | POA: Diagnosis not present

## 2021-02-09 ENCOUNTER — Other Ambulatory Visit: Payer: Self-pay | Admitting: Nurse Practitioner

## 2021-02-09 DIAGNOSIS — N529 Male erectile dysfunction, unspecified: Secondary | ICD-10-CM

## 2021-02-09 DIAGNOSIS — F419 Anxiety disorder, unspecified: Secondary | ICD-10-CM

## 2021-02-11 ENCOUNTER — Other Ambulatory Visit: Payer: Self-pay | Admitting: Nurse Practitioner

## 2021-02-11 NOTE — Telephone Encounter (Signed)
Pharmacy requested refill  Epic LR: 12/10/2020 Pended Rx and sent to [employer] for approval.

## 2021-02-15 ENCOUNTER — Other Ambulatory Visit (HOSPITAL_BASED_OUTPATIENT_CLINIC_OR_DEPARTMENT_OTHER): Payer: Self-pay

## 2021-02-27 ENCOUNTER — Other Ambulatory Visit: Payer: Self-pay | Admitting: Family

## 2021-02-27 ENCOUNTER — Other Ambulatory Visit (HOSPITAL_COMMUNITY): Payer: Self-pay

## 2021-02-27 DIAGNOSIS — F17200 Nicotine dependence, unspecified, uncomplicated: Secondary | ICD-10-CM

## 2021-02-27 MED ORDER — BUPROPION HCL ER (XL) 150 MG PO TB24
ORAL_TABLET | ORAL | 3 refills | Status: DC
  Filled 2021-02-27: qty 60, 31d supply, fill #0
  Filled 2021-04-01: qty 60, 30d supply, fill #1
  Filled 2021-04-29: qty 60, 30d supply, fill #2
  Filled 2021-05-29: qty 60, 30d supply, fill #3

## 2021-02-27 MED FILL — Losartan Potassium Tab 100 MG: ORAL | 90 days supply | Qty: 45 | Fill #0 | Status: AC

## 2021-02-28 ENCOUNTER — Other Ambulatory Visit (HOSPITAL_COMMUNITY): Payer: Self-pay

## 2021-02-28 ENCOUNTER — Other Ambulatory Visit: Payer: Self-pay | Admitting: Nurse Practitioner

## 2021-02-28 DIAGNOSIS — N529 Male erectile dysfunction, unspecified: Secondary | ICD-10-CM

## 2021-02-28 MED ORDER — SILDENAFIL CITRATE 50 MG PO TABS
ORAL_TABLET | ORAL | 1 refills | Status: DC
  Filled 2021-02-28: qty 6, 30d supply, fill #0
  Filled 2021-04-08: qty 6, 30d supply, fill #1
  Filled 2021-05-29: qty 6, 30d supply, fill #2

## 2021-02-28 NOTE — Telephone Encounter (Signed)
Per [name], NP patient can have 10 tablets with 1 refill.

## 2021-03-01 ENCOUNTER — Other Ambulatory Visit (HOSPITAL_COMMUNITY): Payer: Self-pay

## 2021-03-04 ENCOUNTER — Ambulatory Visit (INDEPENDENT_AMBULATORY_CARE_PROVIDER_SITE_OTHER): Payer: 59 | Admitting: Physician Assistant

## 2021-03-04 ENCOUNTER — Ambulatory Visit (HOSPITAL_COMMUNITY)
Admission: RE | Admit: 2021-03-04 | Discharge: 2021-03-04 | Disposition: A | Discharge status: Home / Self Care | Payer: 59 | Source: Ambulatory Visit | Attending: Vascular Surgery | Admitting: Vascular Surgery

## 2021-03-04 ENCOUNTER — Encounter: Payer: Self-pay | Admitting: Physician Assistant

## 2021-03-04 ENCOUNTER — Ambulatory Visit: Payer: Self-pay

## 2021-03-04 ENCOUNTER — Other Ambulatory Visit: Payer: Self-pay

## 2021-03-04 VITALS — BP 153/97 | HR 86 | Temp 98.1°F | Resp 20 | Ht 74.0 in | Wt 233.8 lb

## 2021-03-04 DIAGNOSIS — Z95828 Presence of other vascular implants and grafts: Secondary | ICD-10-CM | POA: Insufficient documentation

## 2021-03-04 DIAGNOSIS — M25561 Pain in right knee: Secondary | ICD-10-CM

## 2021-03-04 MED ORDER — METHYLPREDNISOLONE ACETATE 40 MG/ML IJ SUSP
40.0000 mg | INTRAMUSCULAR | Status: AC | PRN
  Administered 2021-03-04: 40 mg via INTRA_ARTICULAR

## 2021-03-04 MED ORDER — LIDOCAINE HCL 1 % IJ SOLN
5.0000 mL | INTRAMUSCULAR | Status: AC | PRN
  Administered 2021-03-04: 5 mL

## 2021-03-04 NOTE — Progress Notes (Signed)
Office Visit Note   Patient: Aaron [name]           Date of Birth: 1956/11/23           MRN: [id number] Visit Date: 03/04/2021              Requested by: [name], NP [place],  [place] [address] PCP: [name], NP   Assessment & Plan: Visit Diagnoses:  1. Right knee pain, unspecified chronicity     Plan:  We will see him back in 2 weeks to see what type of response he had to the injection.  He is to work on Forensic scientist.  If he continues to have pain in the knee and/or mechanical symptoms which are reviewed with him that it would recommend MRI to rule out meniscal tear.  Questions were encouraged and answered at length.  Follow-Up Instructions: Return in about 2 weeks (around 03/18/2021).   Orders:  Orders Placed This Encounter  Procedures  . Large Joint Inj  . XR Knee 1-2 Views Right   No orders of the defined types were placed in this encounter.     Procedures: Large Joint Inj: R knee on 03/04/2021 5:24 PM Indications: pain Details: 22 G 1.5 in needle, anterolateral approach  Arthrogram: No  Medications: 40 mg methylPREDNISolone acetate 40 MG/ML; 5 mL lidocaine 1 % Aspirate: 15 mL yellow Outcome: tolerated well, no immediate complications Procedure, treatment alternatives, risks and benefits explained, specific risks discussed. Consent was given by the patient. Immediately prior to procedure a time out was called to verify the correct patient, procedure, equipment, support staff and site/side marked as required. Patient was prepped and draped in the usual sterile fashion.       Clinical Data: No additional findings.   Subjective: Chief Complaint  Patient presents with  . Right Knee - Pain    HPI Mr. Aaron [name] is a 65 year old male were seen for the first time for right knee pain.  States 2-1/2 weeks ago he jumped off a pickup truck came down on his knee not have a direct fall onto the knee but had pain in the  knee.  He notes some swelling in the knee mechanical symptoms positive for giving way otherwise negative.  He has tried ibuprofen which is giving maybe some relief.  He is also tried ice.  He has had no prior problems with the right knee.  Review of Systems Negative for fevers chills shortness of breath chest pain  Objective: Vital Signs: There were no vitals taken for this visit.  Physical Exam General: Well-developed well-nourished male no acute distress mood affect .  Psych: Alert and oriented x3. Ortho Exam Bilateral knees good range of motion bilateral knees.  No instability valgus varus stressing of either knee.  No abnormal warmth erythema or effusion of the left knee.  Right knee with a slight effusion but no abnormal warmth.  Tenderness along medial joint line of the right knee and over the pes anserinus region.  McMurray's is negative bilaterally. Is Specialty Comments:  No specialty comments available.  Imaging: VAS [place] EVAR DUPLEX  Result Date: 03/04/2021 Endovascular Aortic Repair Study (EVAR) Indications: Follow up exam for EVAR. Surgery date 08/2012. Risk Factors: Hypertension, hyperlipidemia, current smoker. Limitations: Air/bowel gas.  Comparison Study: 02/21/2020: Patent endovascular aneurysm repair with no                   evidence of endoleak. Maximum diameter measurement  4.99 cm. Performing Technologist: Ivan Croft  Examination Guidelines: A complete evaluation includes B-mode imaging, spectral Doppler, color Doppler, and power Doppler as needed of all accessible portions of each vessel. Bilateral testing is considered an integral part of a complete examination. Limited examinations for reoccurring indications may be performed as noted.  Endovascular Aortic Repair (EVAR): +----------+----------------+-------------------+-------------------+           Diameter AP (cm)Diameter Trans (cm)Velocities (cm/sec)  +----------+----------------+-------------------+-------------------+ Aorta     4.30            4.10               41                  +----------+----------------+-------------------+-------------------+ Right Limb1.70            2.10               46                  +----------+----------------+-------------------+-------------------+ Left Limb 1.70            1.70               54                  +----------+----------------+-------------------+-------------------+  Summary: Abdominal Aorta: Patent endovascular aneurysm repair with no evidence of endoleak. The largest aortic diameter has decreased compared to prior exam. Previous diameter measurement was 4.99 cm obtained on 02/21/2020. Visualization of the mid abdominal aorta was limited due to overlying bowel gas.  *See table(s) above for measurements and observations.  Electronically signed by Servando Snare MD on 03/04/2021 at 2:56:37 PM.   Final      PMFS History: Patient Active Problem List   Diagnosis Date Noted  . Cellulitis and abscess of hand 10/01/2020  . Cellulitis, leg 10/01/2020  . Dog bite 09/30/2020  . Thrombosed external hemorrhoid 12/24/2016  . Uncomplicated asthma 36/62/9476  . High risk medication use 12/24/2016  . Prostate cancer screening 12/24/2016  . Diarrhea 09/17/2016  . Other emphysema ([place]) 04/15/2015  . Erectile dysfunction 12/12/2014  . Fatty liver 04/04/2014  . Pulmonary nodules 04/04/2014  . Chronic back pain 12/06/2013  . Gout 12/06/2013  . Routine general medical examination at a health care facility 12/06/2013  . GERD (gastroesophageal reflux disease) 12/06/2013  . Tongue ulcer 12/06/2013  . AAA (abdominal aortic aneurysm) without rupture ([place]) 11/01/2013  . Depression 06/29/2013  . Musculoskeletal pain 06/07/2013  . Lung nodule, multiple 05/26/2013  . Chronic cough 03/21/2013  . Gouty arthropathy 02/23/2013  . Prediabetes 02/23/2013  . Need for prophylactic vaccination and inoculation  against influenza 09/21/2012  . Pre-operative respiratory examination 09/21/2012  . Asthma, moderate persistent 03/05/2011  . HYPERLIPIDEMIA 01/20/2011  . Hx of smoking 01/20/2011  . Essential hypertension 01/20/2011   Past Medical History:  Diagnosis Date  . Abdominal aortic aneurysm [facility])    sees Dr. Einar Gip for cardiac f/u, [phone]  . Arthritis    lower back  . Asthma    followed by Dr. Berdie Ogren  . Chronic airway obstruction, not elsewhere classified   . Dyspnea    normal PFT April 2012  . External hemorrhoids without mention of complication   . GERD (gastroesophageal reflux disease)   . Gout   . H/O hiatal hernia   . Hemorrhoids   . HTN (hypertension)    hx of sees Dr. Graylin Shiver  . Hyperlipidemia   . Insomnia, unspecified   . Other  abnormal blood chemistry   . Other dyspnea and respiratory abnormality   . Other malaise and fatigue   . Other specified erythematous condition(695.89)   . Other testicular dysfunction   . Prostatitis, unspecified   . Rash 2011   left chest, biopsy 2012 Dr. Rozann Lesches, results pending  . Skin cancer   . Tobacco abuse     Family History  Problem Relation Age of Onset  . Heart attack Father 22  . COPD Father   . Heart disease Father   . Asthma Daughter   . Cancer Mother        Breast  . Lupus Daughter   . Diabetes Other        Grandson   . Colon cancer Neg Hx   . Pancreatic cancer Neg Hx   . Esophageal cancer Neg Hx   . Stomach cancer Neg Hx   . Rectal cancer Neg Hx     Past Surgical History:  Procedure Laterality Date  . ABDOMINAL AORTIC ENDOVASCULAR STENT GRAFT  09/22/2012   Procedure: ABDOMINAL AORTIC ENDOVASCULAR STENT GRAFT;  Surgeon: Mal Misty, MD;  Location: [place];  Service: Vascular;  Laterality: N/A;  GORE; ultrasound guided.  . APPENDECTOMY    . EVALUATION UNDER ANESTHESIA WITH HEMORRHOIDECTOMY N/A 12/31/2016   Procedure: EXAM UNDER ANESTHESIA WITH HEMORRHOIDECTOMY;  Surgeon: [name], MD;  Location:  [place];  Service: General;  Laterality: N/A;  . SKIN CANCER EXCISION    . TOE SURGERY  01/2012   joint of left great toe  . TONSILLECTOMY    . TONSILLECTOMY    . TYMPANOPLASTY    . WRIST SURGERY     left   Social History   Occupational History  . Occupation: Forensic psychologist: [employer]  Tobacco Use  . Smoking status: Former Smoker    Packs/day: 0.25    Years: 38.00    Pack years: 9.50    Types: Cigarettes    Quit date: 10/31/2020    Years since quitting: 0.3  . Smokeless tobacco: Former Systems developer    Types: Snuff  Vaping Use  . Vaping Use: Never used  Substance and Sexual Activity  . Alcohol use: Yes    Alcohol/week: 0.0 standard drinks    Comment: occ  . Drug use: No  . Sexual activity: Not on file

## 2021-03-04 NOTE — Progress Notes (Signed)
Office Note     CC:  follow up Requesting Provider:  [name], NP  HPI: Aaron [name] is a 65 y.o. (November 02, 1956) male who presents one year follow-up for surveillance status post EVARin 2013 by Dr. Kellie Simmering for a 5.1 cm infrarenal aneurysm. Over the last year he reports no gastrointestinal issues. He says that previous bloating and dark stools resolved. No specific abdominal pain. No specific postprandial pain. He reports no significant back pain. He does have some chronic low back pain that causes some numbness in his thighs if he stands for prolonged periods of time. No claudication pain, rest pain or tissue loss.  The pt is on a statin for cholesterol management.  The pt is on a daily aspirin.   Other AC: none The pt is on ARB for hypertension.   The pt is not diabetic.  Tobacco hx: Former, quit December 2021  Past Medical History:  Diagnosis Date  . Abdominal aortic aneurysm [facility])    sees Dr. Einar Gip for cardiac f/u, [phone]  . Arthritis    lower back  . Asthma    followed by Dr. Berdie Ogren  . Chronic airway obstruction, not elsewhere classified   . Dyspnea    normal PFT April 2012  . External hemorrhoids without mention of complication   . GERD (gastroesophageal reflux disease)   . Gout   . H/O hiatal hernia   . Hemorrhoids   . HTN (hypertension)    hx of sees Dr. Graylin Shiver  . Hyperlipidemia   . Insomnia, unspecified   . Other abnormal blood chemistry   . Other dyspnea and respiratory abnormality   . Other malaise and fatigue   . Other specified erythematous condition(695.89)   . Other testicular dysfunction   . Prostatitis, unspecified   . Rash 2011   left chest, biopsy 2012 Dr. Rozann Lesches, results pending  . Skin cancer   . Tobacco abuse     Past Surgical History:  Procedure Laterality Date  . ABDOMINAL AORTIC ENDOVASCULAR STENT GRAFT  09/22/2012   Procedure: ABDOMINAL AORTIC ENDOVASCULAR STENT GRAFT;  Surgeon: Mal Misty, MD;   Location: [place];  Service: Vascular;  Laterality: N/A;  GORE; ultrasound guided.  . APPENDECTOMY    . EVALUATION UNDER ANESTHESIA WITH HEMORRHOIDECTOMY N/A 12/31/2016   Procedure: EXAM UNDER ANESTHESIA WITH HEMORRHOIDECTOMY;  Surgeon: [name], MD;  Location: [place];  Service: General;  Laterality: N/A;  . SKIN CANCER EXCISION    . TOE SURGERY  01/2012   joint of left great toe  . TONSILLECTOMY    . TONSILLECTOMY    . TYMPANOPLASTY    . WRIST SURGERY     left    Social History   Socioeconomic History  . Marital status: Married    Spouse name: Not on file  . Number of children: Not on file  . Years of education: Not on file  . Highest education level: Not on file  Occupational History  . Occupation: Forensic psychologist: [employer]  Tobacco Use  . Smoking status: Former Smoker    Packs/day: 0.25    Years: 38.00    Pack years: 9.50    Types: Cigarettes    Quit date: 10/31/2020    Years since quitting: 0.3  . Smokeless tobacco: Former Systems developer    Types: Snuff  Vaping Use  . Vaping Use: Never used  Substance and Sexual Activity  . Alcohol use: Yes    Alcohol/week: 0.0 standard drinks  Comment: occ  . Drug use: No  . Sexual activity: Not on file  Other Topics Concern  . Not on file  Social History Narrative  . Not on file   Social Determinants of Health   Financial Resource Strain: Not on file  Food Insecurity: Not on file  Transportation Needs: Not on file  Physical Activity: Not on file  Stress: Not on file  Social Connections: Not on file  Intimate Partner Violence: Not on file    Family History  Problem Relation Age of Onset  . Heart attack Father 53  . COPD Father   . Heart disease Father   . Asthma Daughter   . Cancer Mother        Breast  . Lupus Daughter   . Diabetes Other        Grandson   . Colon cancer Neg Hx   . Pancreatic cancer Neg Hx   . Esophageal cancer Neg Hx   . Stomach cancer Neg Hx   . Rectal cancer Neg Hx      Current Outpatient Medications  Medication Sig Dispense Refill  . albuterol (PROVENTIL HFA;VENTOLIN HFA) 108 (90 Base) MCG/ACT inhaler Inhale 2 puffs into the lungs every 6 (six) hours as needed. For cough and wheezing. 24 g 3  . ALPRAZolam (XANAX) 0.25 MG tablet TAKE 1 TABLET BY MOUTH ONCE A DAY AS NEEDED FOR ANXIETY 20 tablet 0  . aspirin EC 81 MG tablet Take 81 mg by mouth daily.    Marland Kitchen buPROPion (WELLBUTRIN XL) 150 MG 24 hr tablet TAKE ONE TABLET BY MOUTH ONCE DAILY FOR 3 DAYS, THEN INCREASE TO ONE BY MOUTH TWICE DAILY. 60 tablet 3  . fluticasone furoate-vilanterol (BREO ELLIPTA) 200-25 MCG/INH AEPB INHALE 1 PUFF INTO LUNGS ONCE DAILY. 60 each 3  . losartan (COZAAR) 100 MG tablet TAKE 1/2 TABLET BY MOUTH ONCE A DAY FOR BLOOD PRESSURE CONTROL 45 tablet 1  . losartan (COZAAR) 100 MG tablet TAKE 1/2 OF TABLET BY MOUTH ONCE A DAY FOR BLOOD PRESSURE CONTROL 45 tablet 1  . losartan (COZAAR) 50 MG tablet TAKE 1 TABLET BY MOUTH ONCE A DAY FOR BLOOD PRESSURE CONTROL 90 tablet 1  . pantoprazole (PROTONIX) 40 MG tablet TAKE 1 TABLET BY MOUTH ONCE A DAY 90 tablet 1  . sildenafil (VIAGRA) 50 MG tablet TAKE 1 TABLET BY MOUTH AS NEEDED FOR ERECTILE DYSFUNCTION 10 tablet 1  . simvastatin (ZOCOR) 20 MG tablet TAKE 1 TABLET BY MOUTH AT BEDTIME 90 tablet 1  . varenicline (CHANTIX) 1 MG tablet TAKE 1/2 TABLET (0.5MG ) BY MOUTH DAILY FOR 3 DAYS, INCREASE TO 1/2 TAB TWICE DAILY FOR 4 DAYS, THEN 1 TABLET (1MG ) 2 TIMES DAILY 52 tablet 0  . Zoster Vaccine Adjuvanted (Optima) injection TO BE ADMINISTERED BY PHARMACIST (Patient taking differently: TO BE ADMINISTERED BY PHARMACIST) 1 each 1   No current facility-administered medications for this visit.    No Known Allergies   REVIEW OF SYSTEMS:  [X]  denotes positive finding, [ ]  denotes negative finding Cardiac  Comments:  Chest pain or chest pressure:    Shortness of breath upon exertion:    Short of breath when lying flat:    Irregular heart rhythm:         Vascular    Pain in calf, thigh, or hip brought on by ambulation:    Pain in feet at night that wakes you up from your sleep:     Blood clot in your veins:  Leg swelling:         Pulmonary    Oxygen at home:    Productive cough:     Wheezing:         Neurologic    Sudden weakness in arms or legs:     Sudden numbness in arms or legs:     Sudden onset of difficulty speaking or slurred speech:    Temporary loss of vision in one eye:     Problems with dizziness:         Gastrointestinal    Blood in stool:     Vomited blood:         Genitourinary    Burning when urinating:     Blood in urine:        Psychiatric    Major depression:         Hematologic    Bleeding problems:    Problems with blood clotting too easily:        Skin    Rashes or ulcers:        Constitutional    Fever or chills:      PHYSICAL EXAMINATION:  Vitals:   03/04/21 0824  BP: (!) 153/97  Pulse: 86  Resp: 20  Temp: 98.1 F (36.7 C)  TempSrc: Temporal  SpO2: 96%  Weight: 233 lb 12.8 oz (106.1 kg)  Height: 6\' 2"  (1.88 m)    General:  WDWN in NAD; vital signs documented above Gait: Normal HENT: WNL, normocephalic Pulmonary: normal non-labored breathing , without wheezing Cardiac: regular HR, without  Murmurs without carotid bruit Abdomen: distended, soft, NT, no masses. Normal BS. Large ventral hernia. Reduces spontaneously  Vascular Exam/Pulses:  Right Left  Radial 2+ (normal) 2+ (normal)  Femoral 2+ (normal) 2+ (normal)  Popliteal 2+ (normal) 2+ (normal)  DP 2+ (normal) 2+ (normal)  PT 2+ (normal) 2+ (normal)   Extremities: without ischemic changes, without Gangrene , without cellulitis; without open wounds;  Musculoskeletal: no muscle wasting or atrophy  Neurologic: A&O X 3;  No focal weakness or paresthesias are detected Psychiatric:  The pt has Normal affect.   Non-Invasive Vascular Imaging:   Endovascular Aortic Repair (EVAR):   +----------+----------------+-------------------+-------------------+       Diameter AP (cm)Diameter Trans (cm)Velocities (cm/sec)  +----------+----------------+-------------------+-------------------+  Aorta   4.30      4.10        41           +----------+----------------+-------------------+-------------------+  Right Limb1.70      2.10        46           +----------+----------------+-------------------+-------------------+  Left Limb 1.70      1.70        54           +----------+----------------+-------------------+-------------------+     ASSESSMENT/PLAN:: 65 y.o. male here for follow up for surveillance of abdominal aortic aneurysm after endovascular stent repair in 2013 with Dr. Kellie Simmering. Ultrasound today confirmed no growth in the aneurysm and appears stable and no endoleak. He is without any abdominal pain or back pain.  - He will follow up again in 1 year with repeat EVAR duplex  Karoline Caldwell, PA-C [facility] [phone]  Clinic MD: Donzetta Matters

## 2021-03-05 ENCOUNTER — Encounter: Payer: Self-pay | Admitting: Family

## 2021-03-05 ENCOUNTER — Ambulatory Visit (INDEPENDENT_AMBULATORY_CARE_PROVIDER_SITE_OTHER): Payer: 59 | Admitting: Family

## 2021-03-05 ENCOUNTER — Other Ambulatory Visit: Payer: Self-pay

## 2021-03-05 ENCOUNTER — Other Ambulatory Visit (HOSPITAL_COMMUNITY): Payer: Self-pay

## 2021-03-05 VITALS — BP 142/86 | HR 89 | Temp 97.2°F | Resp 18 | Ht 74.0 in | Wt 233.6 lb

## 2021-03-05 DIAGNOSIS — I10 Essential (primary) hypertension: Secondary | ICD-10-CM

## 2021-03-05 MED ORDER — LOSARTAN POTASSIUM 100 MG PO TABS
100.0000 mg | ORAL_TABLET | Freq: Every day | ORAL | 0 refills | Status: DC
  Filled 2021-03-05: qty 90, 90d supply, fill #0

## 2021-03-05 NOTE — Patient Instructions (Signed)
Checking B/p at home and record.Notify provider if readings consistently above > 140/90.Record on log and bring log to next visit.

## 2021-03-05 NOTE — Progress Notes (Signed)
Provider: Anaijah Augsburger FNP-C  [name], NP  [name], NP as PCP - General (Nurse Practitioner) [name], [name], NP (Inactive) as Nurse Practitioner (Vascular Surgery) [name], MD as Consulting Physician (General Surgery) Brand Males, MD as Consulting Physician (Pulmonary Disease) [name], MD as Consulting Physician (Cardiology) Starlyn Skeans as Physician Assistant (Dermatology)  Extended Emergency Contact Information Primary Emergency Contact: Wellman,[name] Address: [address]          [place], [place] [address] Johnnette Litter of [place] Phone: [phone] Mobile Phone: [phone] Relation: Spouse  Code Status:  Full code  Goals of care: Advanced Directive information Advanced Directives 11/22/2020  Does Patient Have a Medical Advance Directive? No  Would patient like information on creating a medical advance directive? No - Patient declined  Pre-existing out of facility DNR order (yellow form or pink MOST form) -     Chief Complaint  Patient presents with  . Acute Visit    Patient returns to the office complaining of high blood pressure.     HPI:  Pt is a 65 y.o. male seen today for an acute visit for evaluation of high blood pressure.B/p at home was 150's/90's.His blood pressure was high when he saw his chiropractor yesterday.He took his losartan this morning and B/p has improved.He denies skipping any dosage.His HR 107 b/min on arrival rechecked was 89 b/min apical.currently on losartan 50 mg tablet  Daily.denies any headache,dizziness,vision changes,palpitation,chest pain or shortness of breath. He does not exercise due to his job. Had hotdogs and french fries yesterday   Reports increased stress level to CMA but did not mention to me.currently on Alprazolam.    Had methylprednisolone 40 mg injection to right knee yesterday done at [facility].Has a follow up appointment in 2 weeks if still  in pain plans for MRI to rule out meniscus tear.  Past Medical History:  Diagnosis Date  . Abdominal aortic aneurysm [facility])    sees Dr. Einar Gip for cardiac f/u, [phone]  . Arthritis    lower back  . Asthma    followed by Dr. Berdie Ogren  . Chronic airway obstruction, not elsewhere classified   . Dyspnea    normal PFT April 2012  . External hemorrhoids without mention of complication   . GERD (gastroesophageal reflux disease)   . Gout   . H/O hiatal hernia   . Hemorrhoids   . HTN (hypertension)    hx of sees Dr. Graylin Shiver  . Hyperlipidemia   . Insomnia, unspecified   . Other abnormal blood chemistry   . Other dyspnea and respiratory abnormality   . Other malaise and fatigue   . Other specified erythematous condition(695.89)   . Other testicular dysfunction   . Prostatitis, unspecified   . Rash 2011   left chest, biopsy 2012 Dr. Rozann Lesches, results pending  . Skin cancer   . Tobacco abuse    Past Surgical History:  Procedure Laterality Date  . ABDOMINAL AORTIC ENDOVASCULAR STENT GRAFT  09/22/2012   Procedure: ABDOMINAL AORTIC ENDOVASCULAR STENT GRAFT;  Surgeon: Mal Misty, MD;  Location: [facility];  Service: Vascular;  Laterality: N/A;  GORE; ultrasound guided.  . APPENDECTOMY    . EVALUATION UNDER ANESTHESIA WITH HEMORRHOIDECTOMY N/A 12/31/2016   Procedure: EXAM UNDER ANESTHESIA WITH HEMORRHOIDECTOMY;  Surgeon: [name], MD;  Location: [facility];  Service: General;  Laterality: N/A;  . SKIN CANCER EXCISION    . TOE SURGERY  01/2012   joint  of left great toe  . TONSILLECTOMY    . TONSILLECTOMY    . TYMPANOPLASTY    . WRIST SURGERY     left    No Known Allergies  Outpatient Encounter Medications as of 03/05/2021  Medication Sig  . albuterol (PROVENTIL HFA;VENTOLIN HFA) 108 (90 Base) MCG/ACT inhaler Inhale 2 puffs into the lungs every 6 (six) hours as needed. For cough and wheezing.  Marland Kitchen ALPRAZolam (XANAX) 0.25 MG tablet TAKE 1 TABLET BY MOUTH  ONCE A DAY AS NEEDED FOR ANXIETY  . aspirin EC 81 MG tablet Take 81 mg by mouth daily.  Marland Kitchen buPROPion (WELLBUTRIN XL) 150 MG 24 hr tablet TAKE ONE TABLET BY MOUTH ONCE DAILY FOR 3 DAYS, THEN INCREASE TO ONE BY MOUTH TWICE DAILY.  . fluticasone furoate-vilanterol (BREO ELLIPTA) 200-25 MCG/INH AEPB INHALE 1 PUFF INTO LUNGS ONCE DAILY.  . pantoprazole (PROTONIX) 40 MG tablet TAKE 1 TABLET BY MOUTH ONCE A DAY  . sildenafil (VIAGRA) 50 MG tablet TAKE 1 TABLET BY MOUTH AS NEEDED FOR ERECTILE DYSFUNCTION  . simvastatin (ZOCOR) 20 MG tablet TAKE 1 TABLET BY MOUTH AT BEDTIME  . Zoster Vaccine Adjuvanted (SHINGRIX) injection TO BE ADMINISTERED BY PHARMACIST (Patient taking differently: TO BE ADMINISTERED BY PHARMACIST)  . [DISCONTINUED] losartan (COZAAR) 100 MG tablet TAKE 1/2 OF TABLET BY MOUTH ONCE A DAY FOR BLOOD PRESSURE CONTROL  . losartan (COZAAR) 100 MG tablet Take 1 tablet (100 mg total) by mouth daily.  . varenicline (CHANTIX) 1 MG tablet TAKE 1/2 TABLET (0.5MG ) BY MOUTH DAILY FOR 3 DAYS, INCREASE TO 1/2 TAB TWICE DAILY FOR 4 DAYS, THEN 1 TABLET (1MG ) 2 TIMES DAILY (Patient not taking: Reported on 03/05/2021)  . [DISCONTINUED] losartan (COZAAR) 100 MG tablet TAKE 1/2 TABLET BY MOUTH ONCE A DAY FOR BLOOD PRESSURE CONTROL  . [DISCONTINUED] losartan (COZAAR) 50 MG tablet TAKE 1 TABLET BY MOUTH ONCE A DAY FOR BLOOD PRESSURE CONTROL   No facility-administered encounter medications on file as of 03/05/2021.    Review of Systems  Constitutional: Negative for appetite change, chills, fatigue and fever.  Eyes: Negative for discharge, redness, itching and visual disturbance.  Respiratory: Negative for cough, chest tightness, shortness of breath and wheezing.   Cardiovascular: Negative for chest pain, palpitations and leg swelling.  Gastrointestinal: Negative for abdominal distention, abdominal pain, diarrhea, nausea and vomiting.  Skin: Negative for color change, pallor and rash.  Neurological: Negative for  dizziness, speech difficulty, weakness, light-headedness and headaches.    Immunization History  Administered Date(s) Administered  . Influenza Split 09/20/2012  . Influenza Whole 08/30/2019  . Influenza,inj,Quad PF,6+ Mos 09/29/2013, 08/08/2014, 08/09/2015, 07/17/2016, 10/14/2017, 01/14/2019, 09/21/2020  . Influenza-Unspecified 08/24/2017  . Janssen (J&J) SARS-COV-2 Vaccination 08/04/2020  . Pneumococcal Conjugate-13 12/12/2014, 12/12/2014  . Pneumococcal Polysaccharide-23 06/30/2011  . Tdap 12/06/2013  . Zoster Recombinat (Shingrix) 06/13/2020, 09/07/2020   Pertinent  Health Maintenance Due  Topic Date Due  . INFLUENZA VACCINE  06/24/2021  . COLONOSCOPY (Pts 45-56yrs Insurance coverage will need to be confirmed)  10/03/2026   Fall Risk  03/05/2021 11/22/2020 10/23/2020 09/21/2020 03/20/2020  Falls in the past year? 1 0 1 0 -  Number falls in past yr: 0 0 0 0 0  Injury with Fall? 0 0 0 0 0   Functional Status Survey:    Vitals:   03/05/21 0839  BP: (!) 142/86  Pulse: 89  Resp: 18  Temp: (!) 97.2 F (36.2 C)  SpO2: 97%  Weight: 233 lb 9.6 oz (106 kg)  Height: 6\' 2"  (1.88 m)   Body mass index is 29.99 kg/m. Physical Exam Vitals reviewed.  Constitutional:      General: He is not in acute distress.    Appearance: He is overweight. He is not ill-appearing.  HENT:     Head: Normocephalic.  Eyes:     General: No scleral icterus.       Right eye: No discharge.        Left eye: No discharge.     Conjunctiva/sclera: Conjunctivae normal.     Pupils: Pupils are equal, round, and reactive to light.  Neck:     Vascular: No carotid bruit.  Cardiovascular:     Rate and Rhythm: Normal rate and regular rhythm.     Pulses: Normal pulses.     Heart sounds: Normal heart sounds. No murmur heard. No friction rub. No gallop.   Pulmonary:     Effort: Pulmonary effort is normal. No respiratory distress.     Breath sounds: Normal breath sounds. No wheezing, rhonchi or rales.   Chest:     Chest wall: No tenderness.  Abdominal:     General: Bowel sounds are normal. There is no distension.     Palpations: Abdomen is soft. There is no mass.     Tenderness: There is no abdominal tenderness. There is no right CVA tenderness, left CVA tenderness, guarding or rebound.  Musculoskeletal:        General: No swelling or tenderness. Normal range of motion.     Cervical back: Normal range of motion. No rigidity or tenderness.     Right lower leg: No edema.     Left lower leg: No edema.  Lymphadenopathy:     Cervical: No cervical adenopathy.  Skin:    General: Skin is warm and dry.     Coloration: Skin is not pale.     Findings: No bruising or erythema.  Neurological:     Mental Status: He is alert and oriented to person, place, and time.     Cranial Nerves: No cranial nerve deficit.     Motor: No weakness.     Gait: Gait normal.  Psychiatric:        Mood and Affect: Mood normal.        Behavior: Behavior normal.        Thought Content: Thought content normal.        Judgment: Judgment normal.     Labs reviewed: Recent Labs    09/30/20 1406 10/01/20 0612 10/02/20 0534  NA 133* 138 139  K 3.5 3.6 4.2  CL 101 102 104  CO2 24 28 23   GLUCOSE 99 94 93  BUN 8 7* 9  CREATININE 0.80 0.79 0.87  CALCIUM 8.5* 8.6* 8.7*   Recent Labs    09/18/20 0805 09/30/20 1406 10/01/20 0612  AST 25 36 27  ALT 28 37 31  ALKPHOS  --  68 61  BILITOT 0.7 0.8 0.8  PROT 6.9 7.3 6.6  ALBUMIN  --  4.0 3.6   Recent Labs    06/13/20 1215 09/18/20 0805 09/30/20 1406 10/01/20 0612 10/02/20 0534  WBC 7.3 6.5 8.8 7.2 7.0  NEUTROABS 4,460 3,920 5.7  --   --   HGB 15.1 15.6 14.7 14.0 14.6  HCT 45.0 45.1 43.1 42.6 44.1  MCV 95.1 94.0 93.7 96.4 95.7  PLT 260 285 244 241 261   Lab Results  Component Value Date   TSH 2.24 12/24/2016   Lab Results  Component Value Date   HGBA1C 5.4 09/18/2020   Lab Results  Component Value Date   CHOL 163 09/18/2020   HDL 47  09/18/2020   LDLCALC 95 09/18/2020   TRIG 116 09/18/2020   CHOLHDL 3.5 09/18/2020    Significant Diagnostic Results in last 30 days:  VAS [place] EVAR DUPLEX  Result Date: 03/04/2021 Endovascular Aortic Repair Study (EVAR) Indications: Follow up exam for EVAR. Surgery date 08/2012. Risk Factors: Hypertension, hyperlipidemia, current smoker. Limitations: Air/bowel gas.  Comparison Study: 02/21/2020: Patent endovascular aneurysm repair with no                   evidence of endoleak. Maximum diameter measurement 4.99 cm. Performing Technologist: Ivan Croft  Examination Guidelines: A complete evaluation includes B-mode imaging, spectral Doppler, color Doppler, and power Doppler as needed of all accessible portions of each vessel. Bilateral testing is considered an integral part of a complete examination. Limited examinations for reoccurring indications may be performed as noted.  Endovascular Aortic Repair (EVAR): +----------+----------------+-------------------+-------------------+           Diameter AP (cm)Diameter Trans (cm)Velocities (cm/sec) +----------+----------------+-------------------+-------------------+ Aorta     4.30            4.10               41                  +----------+----------------+-------------------+-------------------+ Right Limb1.70            2.10               46                  +----------+----------------+-------------------+-------------------+ Left Limb 1.70            1.70               54                  +----------+----------------+-------------------+-------------------+  Summary: Abdominal Aorta: Patent endovascular aneurysm repair with no evidence of endoleak. The largest aortic diameter has decreased compared to prior exam. Previous diameter measurement was 4.99 cm obtained on 02/21/2020. Visualization of the mid abdominal aorta was limited due to overlying bowel gas.  *See table(s) above for measurements and observations.  Electronically signed by  Servando Snare MD on 03/04/2021 at 2:56:37 PM.   Final     Assessment/Plan   Essential hypertension B/p readings in the 150's/90's  Advised to increase losartan to 100 mg tablet daily - continue on ASA and Statin - dietary modification and exercise discussed. - Advised to check Blood pressure at home and record on log provided and notify provider if B/p > 140/90  - follow up with PCP in 2 weeks to recheck B/p and routine visit.Has lab work not done from previous visit. - losartan (COZAAR) 100 MG tablet; Take 1 tablet (100 mg total) by mouth daily.  Dispense: 90 tablet; Refill: 0  Family/ staff Communication: Reviewed plan of care with patient verbalized understanding   Labs/tests ordered:  Has lab work in place.  Next Appointment: 2 weeks to recheck B/p and routine visit.Fasting labs prior to visit.   Sandrea Hughs, NP

## 2021-03-09 ENCOUNTER — Other Ambulatory Visit: Payer: Self-pay | Admitting: Nurse Practitioner

## 2021-03-09 ENCOUNTER — Other Ambulatory Visit (HOSPITAL_COMMUNITY): Payer: Self-pay

## 2021-03-09 DIAGNOSIS — J4540 Moderate persistent asthma, uncomplicated: Secondary | ICD-10-CM

## 2021-03-09 DIAGNOSIS — F419 Anxiety disorder, unspecified: Secondary | ICD-10-CM

## 2021-03-11 ENCOUNTER — Other Ambulatory Visit: Payer: 59

## 2021-03-11 ENCOUNTER — Other Ambulatory Visit (HOSPITAL_COMMUNITY): Payer: Self-pay

## 2021-03-11 ENCOUNTER — Other Ambulatory Visit: Payer: Self-pay

## 2021-03-11 DIAGNOSIS — E782 Mixed hyperlipidemia: Secondary | ICD-10-CM

## 2021-03-11 DIAGNOSIS — I10 Essential (primary) hypertension: Secondary | ICD-10-CM

## 2021-03-11 LAB — CBC WITH DIFFERENTIAL/PLATELET
Absolute Monocytes: 796 cells/uL (ref 200–950)
Basophils Absolute: 101 cells/uL (ref 0–200)
Basophils Relative: 1.3 %
Eosinophils Absolute: 211 cells/uL (ref 15–500)
Eosinophils Relative: 2.7 %
HCT: 48.9 % (ref 38.5–50.0)
Hemoglobin: 16.2 g/dL (ref 13.2–17.1)
Lymphs Abs: 1903 cells/uL (ref 850–3900)
MCH: 30.2 pg (ref 27.0–33.0)
MCHC: 33.1 g/dL (ref 32.0–36.0)
MCV: 91.1 fL (ref 80.0–100.0)
MPV: 9.5 fL (ref 7.5–12.5)
Monocytes Relative: 10.2 %
Neutro Abs: 4789 cells/uL (ref 1500–7800)
Neutrophils Relative %: 61.4 %
Platelets: 295 10*3/uL (ref 140–400)
RBC: 5.37 10*6/uL (ref 4.20–5.80)
RDW: 12.1 % (ref 11.0–15.0)
Total Lymphocyte: 24.4 %
WBC: 7.8 10*3/uL (ref 3.8–10.8)

## 2021-03-11 LAB — COMPLETE METABOLIC PANEL WITH GFR
AG Ratio: 2 (calc) (ref 1.0–2.5)
ALT: 32 U/L (ref 9–46)
AST: 27 U/L (ref 10–35)
Albumin: 4.4 g/dL (ref 3.6–5.1)
Alkaline phosphatase (APISO): 88 U/L (ref 35–144)
BUN: 13 mg/dL (ref 7–25)
CO2: 27 mmol/L (ref 20–32)
Calcium: 9.2 mg/dL (ref 8.6–10.3)
Chloride: 105 mmol/L (ref 98–110)
Creat: 0.96 mg/dL (ref 0.70–1.25)
GFR, Est African American: 98 mL/min/{1.73_m2} (ref >=60)
GFR, Est Non African American: 84 mL/min/{1.73_m2} (ref >=60)
Globulin: 2.2 g/dL (calc) (ref 1.9–3.7)
Glucose, Bld: 111 mg/dL — ABNORMAL HIGH (ref 65–99)
Potassium: 4.4 mmol/L (ref 3.5–5.3)
Sodium: 140 mmol/L (ref 135–146)
Total Bilirubin: 0.4 mg/dL (ref 0.2–1.2)
Total Protein: 6.6 g/dL (ref 6.1–8.1)

## 2021-03-11 LAB — LIPID PANEL
Cholesterol: 160 mg/dL (ref <200)
HDL: 52 mg/dL (ref >=40)
LDL Cholesterol (Calc): 81 mg/dL (calc)
Non-HDL Cholesterol (Calc): 108 mg/dL (calc) (ref <130)
Total CHOL/HDL Ratio: 3.1 (calc) (ref <5.0)
Triglycerides: 170 mg/dL — ABNORMAL HIGH (ref <150)

## 2021-03-11 MED ORDER — ALPRAZOLAM 0.25 MG PO TABS
ORAL_TABLET | Freq: Every day | ORAL | 0 refills | Status: DC | PRN
  Filled 2021-03-11: qty 20, 20d supply, fill #0

## 2021-03-11 MED ORDER — BREO ELLIPTA 200-25 MCG/INH IN AEPB
INHALATION_SPRAY | RESPIRATORY_TRACT | 3 refills | Status: DC
  Filled 2021-03-11: qty 60, 30d supply, fill #0
  Filled 2021-04-08: qty 60, 30d supply, fill #1
  Filled 2021-05-13: qty 60, 30d supply, fill #2
  Filled 2021-06-12: qty 60, 30d supply, fill #3

## 2021-03-15 ENCOUNTER — Encounter: Payer: Self-pay | Admitting: Nurse Practitioner

## 2021-03-15 ENCOUNTER — Other Ambulatory Visit (HOSPITAL_COMMUNITY): Payer: Self-pay

## 2021-03-15 ENCOUNTER — Other Ambulatory Visit: Payer: Self-pay

## 2021-03-15 ENCOUNTER — Ambulatory Visit: Payer: 59 | Admitting: Nurse Practitioner

## 2021-03-15 VITALS — BP 128/90 | HR 50 | Temp 97.1°F | Ht 74.0 in | Wt 237.0 lb

## 2021-03-15 DIAGNOSIS — I10 Essential (primary) hypertension: Secondary | ICD-10-CM | POA: Diagnosis not present

## 2021-03-15 DIAGNOSIS — I714 Abdominal aortic aneurysm, without rupture, unspecified: Secondary | ICD-10-CM

## 2021-03-15 DIAGNOSIS — J411 Mucopurulent chronic bronchitis: Secondary | ICD-10-CM

## 2021-03-15 MED ORDER — HYDROCHLOROTHIAZIDE 25 MG PO TABS
25.0000 mg | ORAL_TABLET | Freq: Every day | ORAL | 1 refills | Status: DC
  Filled 2021-03-15: qty 30, 30d supply, fill #0
  Filled 2021-04-10: qty 30, 30d supply, fill #1

## 2021-03-15 NOTE — Patient Instructions (Addendum)
To start Hydrochlorothiazide 25 mg by mouth daily  Follow up in 4 weeks- CPE  [email or web address]">  DASH Eating Plan DASH stands for Dietary Approaches to Stop Hypertension. The DASH eating plan is a healthy eating plan that has been shown to:  Reduce high blood pressure (hypertension).  Reduce your risk for type 2 diabetes, heart disease, and stroke.  Help with weight loss. What are tips for following this plan? Reading food labels  Check food labels for the amount of salt (sodium) per serving. Choose foods with less than 5 percent of the Daily Value of sodium. Generally, foods with less than 300 milligrams (mg) of sodium per serving fit into this eating plan.  To find whole grains, look for the word "whole" as the first word in the ingredient list. Shopping  Buy products labeled as "low-sodium" or "no salt added."  Buy fresh foods. Avoid canned foods and pre-made or frozen meals. Cooking  Avoid adding salt when cooking. Use salt-free seasonings or herbs instead of table salt or sea salt. Check with your health care provider or pharmacist before using salt substitutes.  Do not fry foods. Cook foods using healthy methods such as baking, boiling, grilling, roasting, and broiling instead.  Cook with heart-healthy oils, such as olive, canola, avocado, soybean, or sunflower oil. Meal planning  Eat a balanced diet that includes: ? 4 or more servings of fruits and 4 or more servings of vegetables each day. Try to fill one-half of your plate with fruits and vegetables. ? 6-8 servings of whole grains each day. ? Less than 6 oz (170 g) of lean meat, poultry, or fish each day. A 3-oz (85-g) serving of meat is about the same size as a deck of cards. One egg equals 1 oz (28 g). ? 2-3 servings of low-fat dairy each day. One serving is 1 cup (237 mL). ? 1 serving of nuts, seeds, or beans 5 times each week. ? 2-3 servings of heart-healthy fats.  Healthy fats called omega-3 fatty acids are found in foods such as walnuts, flaxseeds, fortified milks, and eggs. These fats are also found in cold-water fish, such as sardines, salmon, and mackerel.  Limit how much you eat of: ? Canned or prepackaged foods. ? Food that is high in trans fat, such as some fried foods. ? Food that is high in saturated fat, such as fatty meat. ? Desserts and other sweets, sugary drinks, and other foods with added sugar. ? Full-fat dairy products.  Do not salt foods before eating.  Do not eat more than 4 egg yolks a week.  Try to eat at least 2 vegetarian meals a week.  Eat more home-cooked food and less restaurant, buffet, and fast food.   Lifestyle  When eating at a restaurant, ask that your food be prepared with less salt or no salt, if possible.  If you drink alcohol: ? Limit how much you use to:  0-1 drink a day for women who are not pregnant.  0-2 drinks a day for men. ? Be aware of how much alcohol is in your drink. In the [place] one drink equals one 12 oz bottle of beer (355 mL), one 5 oz glass of wine (148 mL), or one 1 oz glass of hard liquor (44 mL). General information  Avoid eating more than 2,300 mg of salt a day. If you have hypertension, you may need to reduce your sodium intake to 1,500 mg a day.  Work with your health care provider to  maintain a healthy body weight or to lose weight. Ask what an ideal weight is for you.  Get at least 30 minutes of exercise that causes your heart to beat faster (aerobic exercise) most days of the week. Activities may include walking, swimming, or biking.  Work with your health care provider or dietitian to adjust your eating plan to your individual calorie needs. What foods should I eat? Fruits All fresh, dried, or frozen fruit. Canned fruit in natural juice (without added sugar). Vegetables Fresh or frozen vegetables (raw, steamed, roasted, or grilled). Low-sodium or reduced-sodium tomato and  vegetable juice. Low-sodium or reduced-sodium tomato sauce and tomato paste. Low-sodium or reduced-sodium canned vegetables. Grains Whole-grain or whole-wheat bread. Whole-grain or whole-wheat pasta. Brown rice. [place] Morrow. Bulgur. Whole-grain and low-sodium cereals. Pita bread. Low-fat, low-sodium crackers. Whole-wheat flour tortillas. Meats and other proteins Skinless chicken or [place]. Ground chicken or [place]. Pork with fat trimmed off. Fish and seafood. Egg whites. Dried beans, peas, or lentils. Unsalted nuts, nut butters, and seeds. Unsalted canned beans. Lean cuts of beef with fat trimmed off. Low-sodium, lean precooked or cured meat, such as sausages or meat loaves. Dairy Low-fat (1%) or fat-free (skim) milk. Reduced-fat, low-fat, or fat-free cheeses. Nonfat, low-sodium ricotta or cottage cheese. Low-fat or nonfat yogurt. Low-fat, low-sodium cheese. Fats and oils Soft margarine without trans fats. Vegetable oil. Reduced-fat, low-fat, or light mayonnaise and salad dressings (reduced-sodium). Canola, safflower, olive, avocado, soybean, and sunflower oils. Avocado. Seasonings and condiments Herbs. Spices. Seasoning mixes without salt. Other foods Unsalted popcorn and pretzels. Fat-free sweets. The items listed above may not be a complete list of foods and beverages you can eat. Contact a dietitian for more information. What foods should I avoid? Fruits Canned fruit in a light or heavy syrup. Fried fruit. Fruit in cream or butter sauce. Vegetables Creamed or fried vegetables. Vegetables in a cheese sauce. Regular canned vegetables (not low-sodium or reduced-sodium). Regular canned tomato sauce and paste (not low-sodium or reduced-sodium). Regular tomato and vegetable juice (not low-sodium or reduced-sodium). Angie Fava. Olives. Grains Baked goods made with fat, such as croissants, muffins, or some breads. Dry pasta or rice meal packs. Meats and other proteins Fatty cuts of meat. Ribs.  Fried meat. Berniece Salines. Bologna, salami, and other precooked or cured meats, such as sausages or meat loaves. Fat from the back of a pig (fatback). Bratwurst. Salted nuts and seeds. Canned beans with added salt. Canned or smoked fish. Whole eggs or egg yolks. Chicken or [place] with skin. Dairy Whole or 2% milk, cream, and half-and-half. Whole or full-fat cream cheese. Whole-fat or sweetened yogurt. Full-fat cheese. Nondairy creamers. Whipped toppings. Processed cheese and cheese spreads. Fats and oils Butter. Stick margarine. Lard. Shortening. Ghee. Bacon fat. Tropical oils, such as coconut, palm kernel, or palm oil. Seasonings and condiments Onion salt, garlic salt, seasoned salt, table salt, and sea salt. Worcestershire sauce. Tartar sauce. Barbecue sauce. Teriyaki sauce. Soy sauce, including reduced-sodium. Steak sauce. Canned and packaged gravies. Fish sauce. Oyster sauce. Cocktail sauce. Store-bought horseradish. Ketchup. Mustard. Meat flavorings and tenderizers. Bouillon cubes. Hot sauces. Pre-made or packaged marinades. Pre-made or packaged taco seasonings. Relishes. Regular salad dressings. Other foods Salted popcorn and pretzels. The items listed above may not be a complete list of foods and beverages you should avoid. Contact a dietitian for more information. Where to find more information  National Heart, Lung, and Blood Institute: https://wilson-eaton.com/  American Heart Association: www.heart.org  Academy of Nutrition and Dietetics: www.eatright.[place]: www.kidney.org  Summary  The DASH eating plan is a healthy eating plan that has been shown to reduce high blood pressure (hypertension). It may also reduce your risk for type 2 diabetes, heart disease, and stroke.  When on the DASH eating plan, aim to eat more fresh fruits and vegetables, whole grains, lean proteins, low-fat dairy, and heart-healthy fats.  With the DASH eating plan, you should limit salt (sodium)  intake to 2,300 mg a day. If you have hypertension, you may need to reduce your sodium intake to 1,500 mg a day.  Work with your health care provider or dietitian to adjust your eating plan to your individual calorie needs. This information is not intended to replace advice given to you by your health care provider. Make sure you discuss any questions you have with your health care provider. Document Revised: 10/14/2019 Document Reviewed: 10/14/2019 Elsevier Patient Education  2021 [employer].

## 2021-03-15 NOTE — Progress Notes (Signed)
Careteam: [name], NP as PCP - General (Nurse Practitioner) [name], [name], NP (Inactive) as Nurse Practitioner (Vascular Surgery) [name], MD as Consulting Physician (General Surgery) Brand Males, MD as Consulting Physician (Pulmonary Disease) [name], MD as Consulting Physician (Cardiology) [name], PA-C as Physician Assistant (Dermatology)  PLACE OF SERVICE:  [place] Directive information    No Known Allergies  Chief Complaint  Patient presents with  . Follow-up    Blood pressure follow-up. Refill simvastatin for 90 day supply at [facility]      HPI: Patient is a 65 y.o. male for follow up on blood pressure.  Losartan was increased to 100 mg at last visit due to elevated blood pressure.  Taking medication at night.  Diet has not been good. Hamburger, chicken pie, pan fried chicken. Lots of soup.  Junk food for snack. Home bps elevated- on recheck bp elevated in office 150/100 and 150/90  Having headaches in the afternoons.  No chest pains or shortness of breath. No palpitations.   Review of Systems:  Review of Systems  Constitutional: Negative for chills, fever and weight loss.  HENT: Negative for tinnitus.   Respiratory: Negative for cough, sputum production and shortness of breath.   Cardiovascular: Negative for chest pain, palpitations and leg swelling.  Gastrointestinal: Negative for abdominal pain, constipation, diarrhea and heartburn.  Genitourinary: Negative for dysuria, frequency and urgency.  Musculoskeletal: Negative for back pain, joint pain and myalgias.  Skin: Negative.   Neurological: Positive for headaches. Negative for dizziness.  Psychiatric/Behavioral: Negative for depression and memory loss. The patient does not have insomnia.     Past Medical History:  Diagnosis Date  . Abdominal aortic aneurysm [facility])    sees Dr. Einar Gip for cardiac f/u,  [phone]  . Arthritis    lower back  . Asthma    followed by Dr. Berdie Ogren  . Chronic airway obstruction, not elsewhere classified   . Dyspnea    normal PFT April 2012  . External hemorrhoids without mention of complication   . GERD (gastroesophageal reflux disease)   . Gout   . H/O hiatal hernia   . Hemorrhoids   . HTN (hypertension)    hx of sees Dr. Graylin Shiver  . Hyperlipidemia   . Insomnia, unspecified   . Other abnormal blood chemistry   . Other dyspnea and respiratory abnormality   . Other malaise and fatigue   . Other specified erythematous condition(695.89)   . Other testicular dysfunction   . Prostatitis, unspecified   . Rash 2011   left chest, biopsy 2012 Dr. Rozann Lesches, results pending  . Skin cancer   . Tobacco abuse    Past Surgical History:  Procedure Laterality Date  . ABDOMINAL AORTIC ENDOVASCULAR STENT GRAFT  09/22/2012   Procedure: ABDOMINAL AORTIC ENDOVASCULAR STENT GRAFT;  Surgeon: Mal Misty, MD;  Location: [place];  Service: Vascular;  Laterality: N/A;  GORE; ultrasound guided.  . APPENDECTOMY    . EVALUATION UNDER ANESTHESIA WITH HEMORRHOIDECTOMY N/A 12/31/2016   Procedure: EXAM UNDER ANESTHESIA WITH HEMORRHOIDECTOMY;  Surgeon: [name], MD;  Location: [place];  Service: General;  Laterality: N/A;  . SKIN CANCER EXCISION    . TOE SURGERY  01/2012   joint of left great toe  . TONSILLECTOMY    . TONSILLECTOMY    . TYMPANOPLASTY    . WRIST SURGERY     left   Social History:  reports that he quit smoking about 4 months ago. His smoking use included cigarettes. He has a 9.50 pack-year smoking history. He has quit using smokeless tobacco.  His smokeless tobacco use included snuff. He reports current alcohol use. He reports that he does not use drugs.  Family History  Problem Relation Age of Onset  . Heart attack Father 66  . COPD Father   . Heart disease Father   . Asthma Daughter   . Cancer Mother        Breast   . Lupus Daughter   . Diabetes Other        Grandson   . Colon cancer Neg Hx   . Pancreatic cancer Neg Hx   . Esophageal cancer Neg Hx   . Stomach cancer Neg Hx   . Rectal cancer Neg Hx     Medications: Patient's Medications  New Prescriptions   No medications on file  Previous Medications   ALBUTEROL (PROVENTIL HFA;VENTOLIN HFA) 108 (90 BASE) MCG/ACT INHALER    Inhale 2 puffs into the lungs every 6 (six) hours as needed. For cough and wheezing.   ALPRAZOLAM (XANAX) 0.25 MG TABLET    TAKE 1 TABLET BY MOUTH ONCE A DAY AS NEEDED FOR ANXIETY   ASPIRIN EC 81 MG TABLET    Take 81 mg by mouth daily.   BUPROPION (WELLBUTRIN XL) 150 MG 24 HR TABLET    TAKE ONE TABLET BY MOUTH ONCE DAILY FOR 3 DAYS, THEN INCREASE TO ONE BY MOUTH TWICE DAILY.   FLUTICASONE FUROATE-VILANTEROL (BREO ELLIPTA) 200-25 MCG/INH AEPB    INHALE 1 PUFF INTO LUNGS ONCE DAILY.   LOSARTAN (COZAAR) 100 MG TABLET    Take 1 tablet (100 mg total) by mouth daily.   PANTOPRAZOLE (PROTONIX) 40 MG TABLET    TAKE 1 TABLET BY MOUTH ONCE A DAY   SILDENAFIL (VIAGRA) 50 MG TABLET    TAKE 1 TABLET BY MOUTH AS NEEDED FOR ERECTILE DYSFUNCTION   SIMVASTATIN (ZOCOR) 20 MG TABLET    TAKE 1 TABLET BY MOUTH AT BEDTIME  Modified Medications   No medications on file  Discontinued Medications   VARENICLINE (CHANTIX) 1 MG TABLET    TAKE 1/2 TABLET (0.5MG ) BY MOUTH DAILY FOR 3 DAYS, INCREASE TO 1/2 TAB TWICE DAILY FOR 4 DAYS, THEN 1 TABLET (1MG ) 2 TIMES DAILY   ZOSTER VACCINE ADJUVANTED (SHINGRIX) INJECTION    TO BE ADMINISTERED BY PHARMACIST    Physical Exam:  Vitals:   03/15/21 0905  BP: 128/90  Pulse: (!) 50  Temp: (!) 97.1 F (36.2 C)  TempSrc: Temporal  SpO2: 98%  Weight: 237 lb (107.5 kg)  Height: 6\' 2"  (1.88 m)   Body mass index is 30.43 kg/m. Wt Readings from Last 3 Encounters:  03/15/21 237 lb (107.5 kg)  03/05/21 233 lb 9.6 oz (106 kg)  03/04/21 233 lb 12.8 oz (106.1 kg)    Physical Exam Constitutional:      General:  He is not in acute distress.    Appearance: He is well-developed. He is not diaphoretic.  HENT:     Head: Normocephalic and atraumatic.     Mouth/Throat:     Pharynx: No oropharyngeal exudate.  Eyes:     Conjunctiva/sclera: Conjunctivae normal.     Pupils: Pupils are equal, round, and reactive to light.  Cardiovascular:     Rate and Rhythm: Normal rate and regular rhythm.     Heart sounds: Normal heart sounds.  Pulmonary:  Effort: Pulmonary effort is normal.     Breath sounds: Normal breath sounds.  Abdominal:     General: Bowel sounds are normal.     Palpations: Abdomen is soft.  Musculoskeletal:        General: No tenderness.     Cervical back: Normal range of motion and neck supple.  Skin:    General: Skin is warm and dry.  Neurological:     Mental Status: He is alert and oriented to person, place, and time.     Labs reviewed: Basic Metabolic Panel: Recent Labs    10/01/20 0612 10/02/20 0534 03/11/21 0936  NA 138 139 140  K 3.6 4.2 4.4  CL 102 104 105  CO2 28 23 27   GLUCOSE 94 93 111*  BUN 7* 9 13  CREATININE 0.79 0.87 0.96  CALCIUM 8.6* 8.7* 9.2   Liver Function Tests: Recent Labs    09/30/20 1406 10/01/20 0612 03/11/21 0936  AST 36 27 27  ALT 37 31 32  ALKPHOS 68 61  --   BILITOT 0.8 0.8 0.4  PROT 7.3 6.6 6.6  ALBUMIN 4.0 3.6  --    No results for input(s): LIPASE, AMYLASE in the last 8760 hours. No results for input(s): AMMONIA in the last 8760 hours. CBC: Recent Labs    09/18/20 0805 09/30/20 1406 10/01/20 0612 10/02/20 0534 03/11/21 0936  WBC 6.5 8.8 7.2 7.0 7.8  NEUTROABS 3,920 5.7  --   --  4,789  HGB 15.6 14.7 14.0 14.6 16.2  HCT 45.1 43.1 42.6 44.1 48.9  MCV 94.0 93.7 96.4 95.7 91.1  PLT 285 244 241 261 295   Lipid Panel: Recent Labs    09/18/20 0805 03/11/21 0936  CHOL 163 160  HDL 47 52  LDLCALC 95 81  TRIG 116 170*  CHOLHDL 3.5 3.1   TSH: No results for input(s): TSH in the last 8760 hours. A1C: Lab Results   Component Value Date   HGBA1C 5.4 09/18/2020     Assessment/Plan 1. Chronic bronchitis with productive mucopurulent cough (HCC) Stable at this time. Continue on breo daily   2. AAA (abdominal aortic aneurysm) without rupture ([place]) -continues to be followed by vascular. Stressed importance of proper BP controlled.   3. Essential hypertension -not controlled. Continue losartan 100 mg daily -dietary modifications discussed and encouraged.  - hydrochlorothiazide (HYDRODIURIL) 25 MG tablet; Take 1 tablet (25 mg total) by mouth daily.  Dispense: 30 tablet; Refill: 1  Next appt: 4 weeks for CPE and bp check, labs at appt.  Carlos American. [place], [facility] [phone]

## 2021-03-18 ENCOUNTER — Other Ambulatory Visit (HOSPITAL_COMMUNITY): Payer: Self-pay

## 2021-03-18 MED FILL — Pantoprazole Sodium EC Tab 40 MG (Base Equiv): ORAL | 90 days supply | Qty: 90 | Fill #0 | Status: AC

## 2021-03-19 ENCOUNTER — Other Ambulatory Visit (HOSPITAL_COMMUNITY): Payer: Self-pay

## 2021-03-19 MED FILL — Simvastatin Tab 20 MG: ORAL | 90 days supply | Qty: 90 | Fill #0 | Status: AC

## 2021-03-22 DIAGNOSIS — M9903 Segmental and somatic dysfunction of lumbar region: Secondary | ICD-10-CM | POA: Diagnosis not present

## 2021-03-22 DIAGNOSIS — M9902 Segmental and somatic dysfunction of thoracic region: Secondary | ICD-10-CM | POA: Diagnosis not present

## 2021-03-22 DIAGNOSIS — M542 Cervicalgia: Secondary | ICD-10-CM | POA: Diagnosis not present

## 2021-03-22 DIAGNOSIS — M9901 Segmental and somatic dysfunction of cervical region: Secondary | ICD-10-CM | POA: Diagnosis not present

## 2021-03-22 DIAGNOSIS — M546 Pain in thoracic spine: Secondary | ICD-10-CM | POA: Diagnosis not present

## 2021-03-22 DIAGNOSIS — M62830 Muscle spasm of back: Secondary | ICD-10-CM | POA: Diagnosis not present

## 2021-04-01 ENCOUNTER — Other Ambulatory Visit (HOSPITAL_COMMUNITY): Payer: Self-pay

## 2021-04-01 ENCOUNTER — Other Ambulatory Visit: Payer: Self-pay | Admitting: Nurse Practitioner

## 2021-04-01 DIAGNOSIS — F419 Anxiety disorder, unspecified: Secondary | ICD-10-CM

## 2021-04-01 NOTE — Telephone Encounter (Signed)
RX last filled on 03/11/21 #20 pills in epic, treatment agreement on file from 08/2020

## 2021-04-02 ENCOUNTER — Other Ambulatory Visit (HOSPITAL_COMMUNITY): Payer: Self-pay

## 2021-04-03 ENCOUNTER — Other Ambulatory Visit (HOSPITAL_COMMUNITY): Payer: Self-pay

## 2021-04-03 NOTE — Telephone Encounter (Signed)
Prescription to last 30 days, if he feels like he needs every day we need to set up appt for different medication.

## 2021-04-04 ENCOUNTER — Other Ambulatory Visit (HOSPITAL_COMMUNITY): Payer: Self-pay

## 2021-04-08 ENCOUNTER — Encounter: Payer: Self-pay | Admitting: Nurse Practitioner

## 2021-04-08 ENCOUNTER — Other Ambulatory Visit (HOSPITAL_COMMUNITY): Payer: Self-pay

## 2021-04-08 ENCOUNTER — Other Ambulatory Visit: Payer: Self-pay

## 2021-04-08 ENCOUNTER — Ambulatory Visit (INDEPENDENT_AMBULATORY_CARE_PROVIDER_SITE_OTHER): Payer: 59 | Admitting: Nurse Practitioner

## 2021-04-08 VITALS — BP 126/84 | HR 88 | Temp 97.1°F | Ht 73.03 in | Wt 236.0 lb

## 2021-04-08 DIAGNOSIS — M62830 Muscle spasm of back: Secondary | ICD-10-CM | POA: Diagnosis not present

## 2021-04-08 DIAGNOSIS — M9905 Segmental and somatic dysfunction of pelvic region: Secondary | ICD-10-CM | POA: Diagnosis not present

## 2021-04-08 DIAGNOSIS — F419 Anxiety disorder, unspecified: Secondary | ICD-10-CM | POA: Diagnosis not present

## 2021-04-08 DIAGNOSIS — Z0000 Encounter for general adult medical examination without abnormal findings: Secondary | ICD-10-CM | POA: Diagnosis not present

## 2021-04-08 DIAGNOSIS — M9903 Segmental and somatic dysfunction of lumbar region: Secondary | ICD-10-CM | POA: Diagnosis not present

## 2021-04-08 DIAGNOSIS — M9902 Segmental and somatic dysfunction of thoracic region: Secondary | ICD-10-CM | POA: Diagnosis not present

## 2021-04-08 MED ORDER — ALPRAZOLAM 0.25 MG PO TABS
0.2500 mg | ORAL_TABLET | Freq: Every day | ORAL | 1 refills | Status: DC | PRN
  Filled 2021-04-08: qty 20, 30d supply, fill #0
  Filled 2021-04-29 – 2021-05-13 (×2): qty 20, 30d supply, fill #1

## 2021-04-08 NOTE — Patient Instructions (Signed)

## 2021-04-08 NOTE — Progress Notes (Signed)
Provider: [name], NP  [name], NP as PCP - General (Nurse Practitioner) [name], [name], NP (Inactive) as Nurse Practitioner (Vascular Surgery) [name], MD as Consulting Physician (General Surgery) Brand Males, MD as Consulting Physician (Pulmonary Disease) [name], MD as Consulting Physician (Cardiology) [name], PA-C as Physician Assistant (Dermatology)  Extended Emergency Contact Information Primary Emergency Contact: Chasteen,[name] Address: [address]          [place], [place] [address] Johnnette Litter of [place] Phone: [phone] Mobile Phone: [phone] Relation: Spouse No Known Allergies Code Status: FULL Goals of Care: Advanced Directive information Advanced Directives 04/08/2021  Does Patient Have a Medical Advance Directive? No  Would patient like information on creating a medical advance directive? Yes (MAU/Ambulatory/Procedural Areas - Information given)  Pre-existing out of facility DNR order (yellow form or pink MOST form) -     Chief Complaint  Patient presents with  . Annual Exam    Yearly physical. Discuss need for covid booster #2    HPI: Patient is a 64 y.o. male seen in today for an wellness exam [facility]  Diet? Wife is on a diet but he does not follow like she does. He does try to eat healthy.   Exercise? Not exercising routinely   Dentition:routine dental checks.   Ophthalmology appt: plans to make follow up with ophthalmology   Depression screen [facility] 2/9 04/08/2021 10/23/2020 09/21/2020 09/19/2019 01/14/2019  Decreased Interest 0 0 0 0 0  Down, Depressed, Hopeless 0 0 0 0 0  PHQ - 2 Score 0 0 0 0 0  Some recent data might be hidden    Fall Risk  04/08/2021 03/15/2021 03/05/2021 11/22/2020 10/23/2020  Falls in the past year? 1 1 1  0 1  Number falls in past yr: 0 0 0 0 0  Injury with Fall? 0 0 0 0 0   No flowsheet data found.   Health Maintenance  Topic Date Due   . COVID-19 Vaccine (2 - Booster for [employer] series) 09/29/2020  . INFLUENZA VACCINE  06/24/2021  . TETANUS/TDAP  12/07/2023  . COLONOSCOPY (Pts 45-20yrs Insurance coverage will need to be confirmed)  10/03/2026  . HPV VACCINES  Aged Out  . Hepatitis C Screening  Discontinued  . HIV Screening  Discontinued    Past Medical History:  Diagnosis Date  . Abdominal aortic aneurysm [facility])    sees Dr. Einar Gip for cardiac f/u, [phone]  . Arthritis    lower back  . Asthma    followed by Dr. Berdie Ogren  . Chronic airway obstruction, not elsewhere classified   . Dyspnea    normal PFT April 2012  . External hemorrhoids without mention of complication   . GERD (gastroesophageal reflux disease)   . Gout   . H/O hiatal hernia   . Hemorrhoids   . HTN (hypertension)    hx of sees Dr. Graylin Shiver  . Hyperlipidemia   . Insomnia, unspecified   . Other abnormal blood chemistry   . Other dyspnea and respiratory abnormality   . Other malaise and fatigue   . Other specified erythematous condition(695.89)   . Other testicular dysfunction   . Prostatitis, unspecified   . Rash 2011   left chest, biopsy 2012 Dr. Rozann Lesches, results pending  . Skin cancer   . Tobacco abuse     Past Surgical History:  Procedure Laterality Date  . ABDOMINAL AORTIC ENDOVASCULAR STENT GRAFT  09/22/2012   Procedure: ABDOMINAL AORTIC ENDOVASCULAR  STENT GRAFT;  Surgeon: Mal Misty, MD;  Location: [place];  Service: Vascular;  Laterality: N/A;  GORE; ultrasound guided.  . APPENDECTOMY    . EVALUATION UNDER ANESTHESIA WITH HEMORRHOIDECTOMY N/A 12/31/2016   Procedure: EXAM UNDER ANESTHESIA WITH HEMORRHOIDECTOMY;  Surgeon: [name], MD;  Location: [place];  Service: General;  Laterality: N/A;  . SKIN CANCER EXCISION    . TOE SURGERY  01/2012   joint of left great toe  . TONSILLECTOMY    . TONSILLECTOMY    . TYMPANOPLASTY    . WRIST SURGERY     left    Social History   Socioeconomic  History  . Marital status: Married    Spouse name: Not on file  . Number of children: Not on file  . Years of education: Not on file  . Highest education level: Not on file  Occupational History  . Occupation: Forensic psychologist: [employer]  Tobacco Use  . Smoking status: Former Smoker    Packs/day: 0.25    Years: 38.00    Pack years: 9.50    Types: Cigarettes    Quit date: 10/31/2020    Years since quitting: 0.4  . Smokeless tobacco: Former Systems developer    Types: Snuff  Vaping Use  . Vaping Use: Never used  Substance and Sexual Activity  . Alcohol use: Yes    Alcohol/week: 0.0 standard drinks    Comment: occ  . Drug use: No  . Sexual activity: Not on file  Other Topics Concern  . Not on file  Social History Narrative  . Not on file   Social Determinants of Health   Financial Resource Strain: Not on file  Food Insecurity: Not on file  Transportation Needs: Not on file  Physical Activity: Not on file  Stress: Not on file  Social Connections: Not on file    Family History  Problem Relation Age of Onset  . Heart attack Father 65  . COPD Father   . Heart disease Father   . Asthma Daughter   . Cancer Mother        Breast  . Lupus Daughter   . Diabetes Other        Grandson   . Colon cancer Neg Hx   . Pancreatic cancer Neg Hx   . Esophageal cancer Neg Hx   . Stomach cancer Neg Hx   . Rectal cancer Neg Hx     Review of Systems:  Review of Systems  Constitutional: Negative for chills, fever and weight loss.  HENT: Negative for tinnitus.   Respiratory: Negative for cough, sputum production and shortness of breath.   Cardiovascular: Negative for chest pain, palpitations and leg swelling.  Gastrointestinal: Negative for abdominal pain, constipation, diarrhea and heartburn.  Genitourinary: Negative for dysuria, frequency and urgency.  Musculoskeletal: Positive for back pain. Negative for falls, joint pain and myalgias.  Skin: Negative.   Neurological: Negative for  dizziness and headaches.  Psychiatric/Behavioral: Negative for depression and memory loss. The patient is nervous/anxious. The patient does not have insomnia.      Allergies as of 04/08/2021   No Known Allergies     Medication List       Accurate as of Apr 08, 2021  9:47 AM. If you have any questions, ask your nurse or doctor.        albuterol 108 (90 Base) MCG/ACT inhaler Commonly known as: VENTOLIN HFA Inhale 2 puffs into the lungs every 6 (  six) hours as needed. For cough and wheezing.   ALPRAZolam 0.25 MG tablet Commonly known as: XANAX TAKE 1 TABLET BY MOUTH ONCE A DAY AS NEEDED FOR ANXIETY   aspirin EC 81 MG tablet Take 81 mg by mouth daily.   Breo Ellipta 200-25 MCG/INH Aepb Generic drug: fluticasone furoate-vilanterol INHALE 1 PUFF INTO LUNGS ONCE DAILY.   buPROPion 150 MG 24 hr tablet Commonly known as: WELLBUTRIN XL TAKE ONE TABLET BY MOUTH ONCE DAILY FOR 3 DAYS, THEN INCREASE TO ONE BY MOUTH TWICE DAILY.   hydrochlorothiazide 25 MG tablet Commonly known as: HYDRODIURIL Take 1 tablet (25 mg total) by mouth daily.   losartan 100 MG tablet Commonly known as: COZAAR Take 1 tablet (100 mg total) by mouth daily.   pantoprazole 40 MG tablet Commonly known as: PROTONIX TAKE 1 TABLET BY MOUTH ONCE A DAY   sildenafil 50 MG tablet Commonly known as: VIAGRA TAKE 1 TABLET BY MOUTH AS NEEDED FOR ERECTILE DYSFUNCTION   simvastatin 20 MG tablet Commonly known as: ZOCOR TAKE 1 TABLET BY MOUTH AT BEDTIME         Physical Exam: Vitals:   04/08/21 0937  BP: 136/88  Pulse: 88  Temp: (!) 97.1 F (36.2 C)  TempSrc: Temporal  SpO2: 97%  Weight: 236 lb (107 kg)  Height: 6' 1.03" (1.855 m)   Body mass index is 31.12 kg/m. Wt Readings from Last 3 Encounters:  04/08/21 236 lb (107 kg)  03/15/21 237 lb (107.5 kg)  03/05/21 233 lb 9.6 oz (106 kg)    Physical Exam Constitutional:      General: He is not in acute distress.    Appearance: He is  well-developed. He is not diaphoretic.  HENT:     Head: Normocephalic and atraumatic.     Mouth/Throat:     Pharynx: No oropharyngeal exudate.  Eyes:     Conjunctiva/sclera: Conjunctivae normal.     Pupils: Pupils are equal, round, and reactive to light.  Cardiovascular:     Rate and Rhythm: Normal rate and regular rhythm.     Heart sounds: Normal heart sounds.  Pulmonary:     Effort: Pulmonary effort is normal.     Breath sounds: Normal breath sounds.  Abdominal:     General: Bowel sounds are normal.     Palpations: Abdomen is soft.  Musculoskeletal:        General: No tenderness.     Cervical back: Normal range of motion and neck supple.  Skin:    General: Skin is warm and dry.  Neurological:     Mental Status: He is alert and oriented to person, place, and time.     Labs reviewed: Basic Metabolic Panel: Recent Labs    10/01/20 0612 10/02/20 0534 03/11/21 0936  NA 138 139 140  K 3.6 4.2 4.4  CL 102 104 105  CO2 28 23 27   GLUCOSE 94 93 111*  BUN 7* 9 13  CREATININE 0.79 0.87 0.96  CALCIUM 8.6* 8.7* 9.2   Liver Function Tests: Recent Labs    09/30/20 1406 10/01/20 0612 03/11/21 0936  AST 36 27 27  ALT 37 31 32  ALKPHOS 68 61  --   BILITOT 0.8 0.8 0.4  PROT 7.3 6.6 6.6  ALBUMIN 4.0 3.6  --    No results for input(s): LIPASE, AMYLASE in the last 8760 hours. No results for input(s): AMMONIA in the last 8760 hours. CBC: Recent Labs    09/18/20 0805 09/30/20 1406 10/01/20  [id number] 10/02/20 0534 03/11/21 0936  WBC 6.5 8.8 7.2 7.0 7.8  NEUTROABS 3,920 5.7  --   --  4,789  HGB 15.6 14.7 14.0 14.6 16.2  HCT 45.1 43.1 42.6 44.1 48.9  MCV 94.0 93.7 96.4 95.7 91.1  PLT 285 244 241 261 295   Lipid Panel: Recent Labs    09/18/20 0805 03/11/21 0936  CHOL 163 160  HDL 47 52  LDLCALC 95 81  TRIG 116 170*  CHOLHDL 3.5 3.1   Lab Results  Component Value Date   HGBA1C 5.4 09/18/2020    Procedures: No results found.  Assessment/Plan 1. Preventative  health care -up to date on blood work,  Information provided on the appropriate use of alcohol, prevention of dental and periodontal disease, diet, regular sustained exercise for at least 30 minutes 5 times per week, testicular self-examination on a monthly basis,smoking cessation, tobacco use,  and recommended schedule for GI hemoccult testing, colonoscopy, cholesterol, thyroid and diabetes screening  2. Anxiety -to increase physical activity. He does not wish to be on daily medication at this time.  - ALPRAZolam (XANAX) 0.25 MG tablet; Take 1 tablet (0.25 mg total) by mouth daily as needed for anxiety (to last 30 days).  Dispense: 20 tablet; Refill: 1   Next appt: 73months  Lister Brizzi K. [place], [facility] [phone]

## 2021-04-09 ENCOUNTER — Other Ambulatory Visit (HOSPITAL_COMMUNITY): Payer: Self-pay

## 2021-04-10 ENCOUNTER — Other Ambulatory Visit (HOSPITAL_COMMUNITY): Payer: Self-pay

## 2021-04-10 DIAGNOSIS — M9903 Segmental and somatic dysfunction of lumbar region: Secondary | ICD-10-CM | POA: Diagnosis not present

## 2021-04-10 DIAGNOSIS — M9905 Segmental and somatic dysfunction of pelvic region: Secondary | ICD-10-CM | POA: Diagnosis not present

## 2021-04-10 DIAGNOSIS — M9902 Segmental and somatic dysfunction of thoracic region: Secondary | ICD-10-CM | POA: Diagnosis not present

## 2021-04-10 DIAGNOSIS — M62830 Muscle spasm of back: Secondary | ICD-10-CM | POA: Diagnosis not present

## 2021-04-18 ENCOUNTER — Other Ambulatory Visit (HOSPITAL_COMMUNITY): Payer: Self-pay

## 2021-04-18 ENCOUNTER — Other Ambulatory Visit: Payer: Self-pay | Admitting: *Deleted

## 2021-04-18 DIAGNOSIS — I10 Essential (primary) hypertension: Secondary | ICD-10-CM

## 2021-04-18 MED ORDER — LOSARTAN POTASSIUM 100 MG PO TABS
100.0000 mg | ORAL_TABLET | Freq: Every day | ORAL | 1 refills | Status: DC
  Filled 2021-04-18 (×2): qty 90, 90d supply, fill #0
  Filled 2021-04-26: qty 15, 15d supply, fill #1
  Filled 2021-04-26: qty 15, 15d supply, fill #0

## 2021-04-18 NOTE — Telephone Encounter (Signed)
Patient requested refill.  Patient going out of town.

## 2021-04-19 ENCOUNTER — Other Ambulatory Visit (HOSPITAL_COMMUNITY): Payer: Self-pay

## 2021-04-26 ENCOUNTER — Other Ambulatory Visit: Payer: Self-pay | Admitting: *Deleted

## 2021-04-26 ENCOUNTER — Other Ambulatory Visit (HOSPITAL_COMMUNITY): Payer: Self-pay

## 2021-04-26 ENCOUNTER — Telehealth: Payer: Self-pay

## 2021-04-26 DIAGNOSIS — I10 Essential (primary) hypertension: Secondary | ICD-10-CM

## 2021-04-26 MED ORDER — LOSARTAN POTASSIUM 100 MG PO TABS
100.0000 mg | ORAL_TABLET | Freq: Every day | ORAL | 1 refills | Status: DC
Start: 2021-04-26 — End: 2021-11-06
  Filled 2021-04-26: qty 90, 90d supply, fill #0
  Filled 2021-08-12: qty 90, 90d supply, fill #1

## 2021-04-26 NOTE — Telephone Encounter (Signed)
Patient called and stated that the pharmacy never received his Losartan Rx.  Refaxed to pharmacy.

## 2021-04-26 NOTE — Telephone Encounter (Signed)
Patient left voicemail message on clinical intake voicemail stating problems with his medication. I tried calling patient back,but no answer. I left message for patient to call office back.

## 2021-04-29 ENCOUNTER — Encounter: Payer: Self-pay | Admitting: Adult Health

## 2021-04-29 ENCOUNTER — Other Ambulatory Visit: Payer: Self-pay

## 2021-04-29 ENCOUNTER — Ambulatory Visit
Admission: RE | Admit: 2021-04-29 | Discharge: 2021-04-29 | Disposition: A | Discharge status: Home / Self Care | Payer: 59 | Source: Ambulatory Visit | Attending: Adult Health | Admitting: Adult Health

## 2021-04-29 ENCOUNTER — Ambulatory Visit (INDEPENDENT_AMBULATORY_CARE_PROVIDER_SITE_OTHER): Payer: 59 | Admitting: Adult Health

## 2021-04-29 ENCOUNTER — Other Ambulatory Visit (HOSPITAL_COMMUNITY): Payer: Self-pay

## 2021-04-29 VITALS — BP 108/80 | HR 96 | Temp 97.3°F | Resp 16 | Ht 73.03 in | Wt 232.6 lb

## 2021-04-29 DIAGNOSIS — M542 Cervicalgia: Secondary | ICD-10-CM | POA: Diagnosis not present

## 2021-04-29 DIAGNOSIS — M47812 Spondylosis without myelopathy or radiculopathy, cervical region: Secondary | ICD-10-CM | POA: Diagnosis not present

## 2021-04-29 IMAGING — CR DG CERVICAL SPINE COMPLETE 4+V
6 series · 6 of 6 positions shown · non-contrast
Comparison: [DATE]

CLINICAL DATA: Left-sided neck pain

EXAM:
CERVICAL SPINE - COMPLETE 4+ VIEW

[w cervical spine lat]
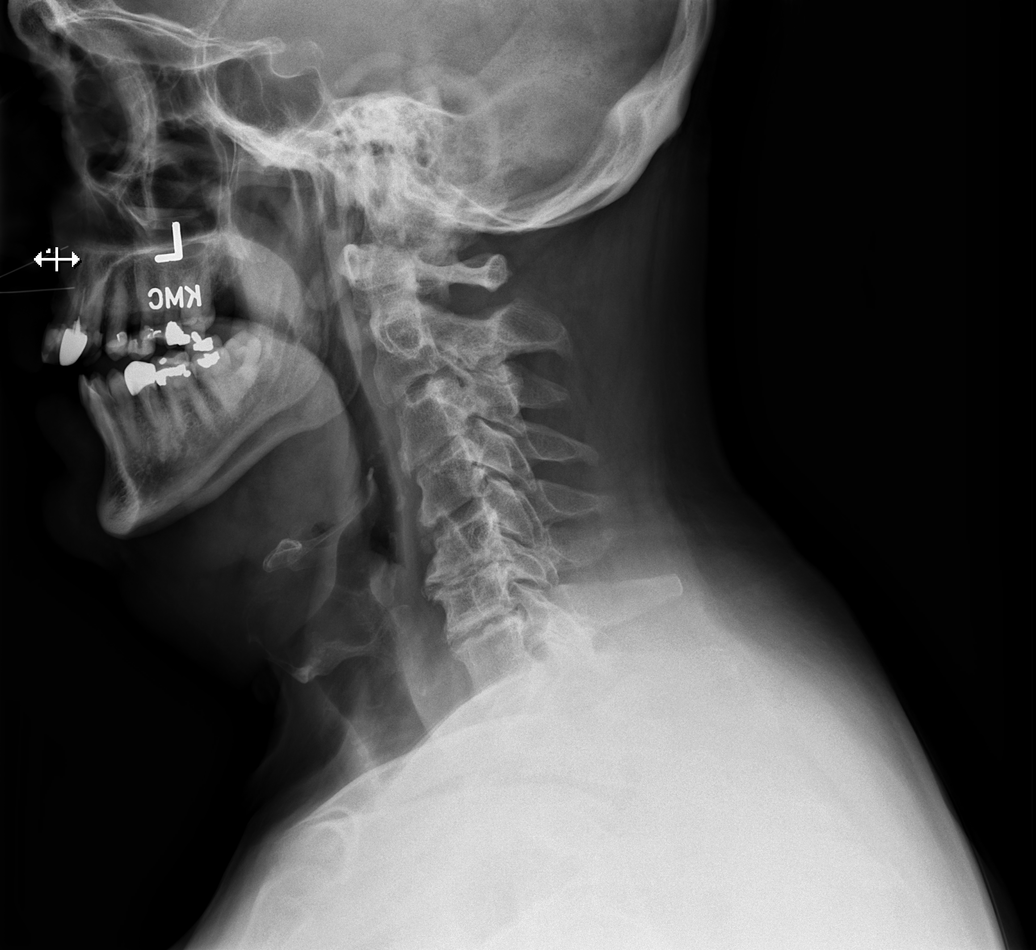

[w cervical spine ap_obl (1 of 2)]
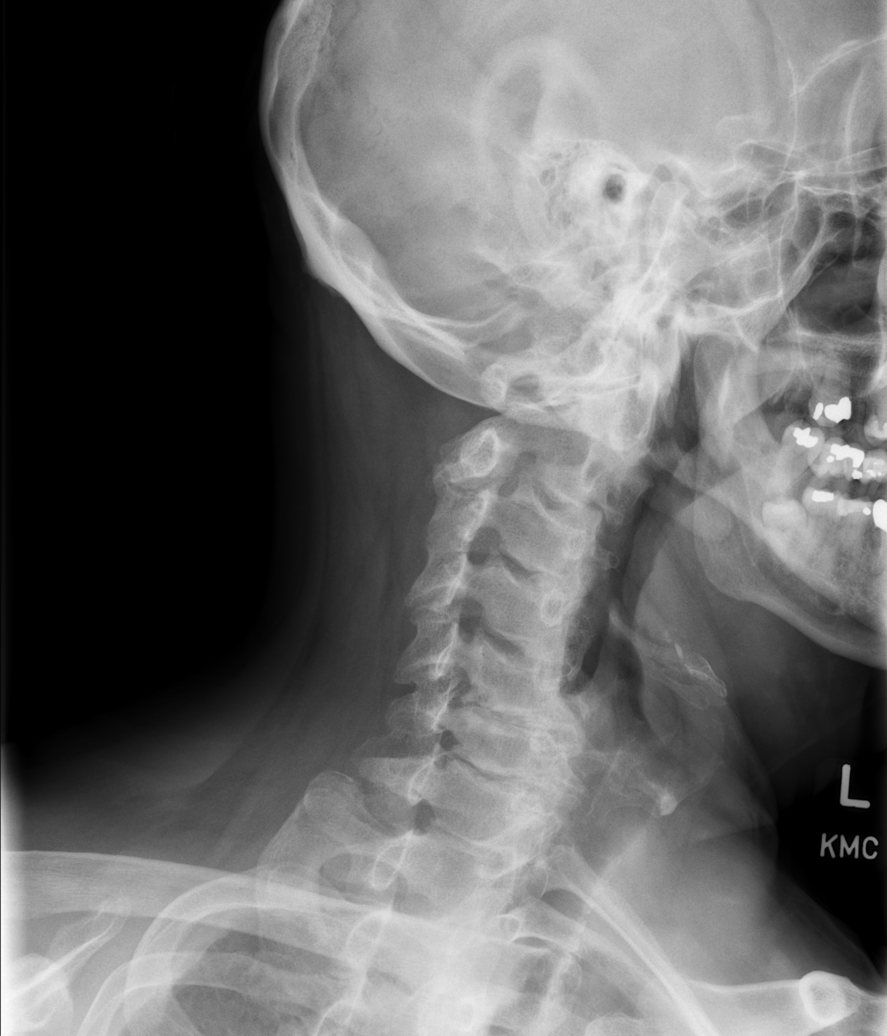

[w cervical spine ap_obl (2 of 2)]
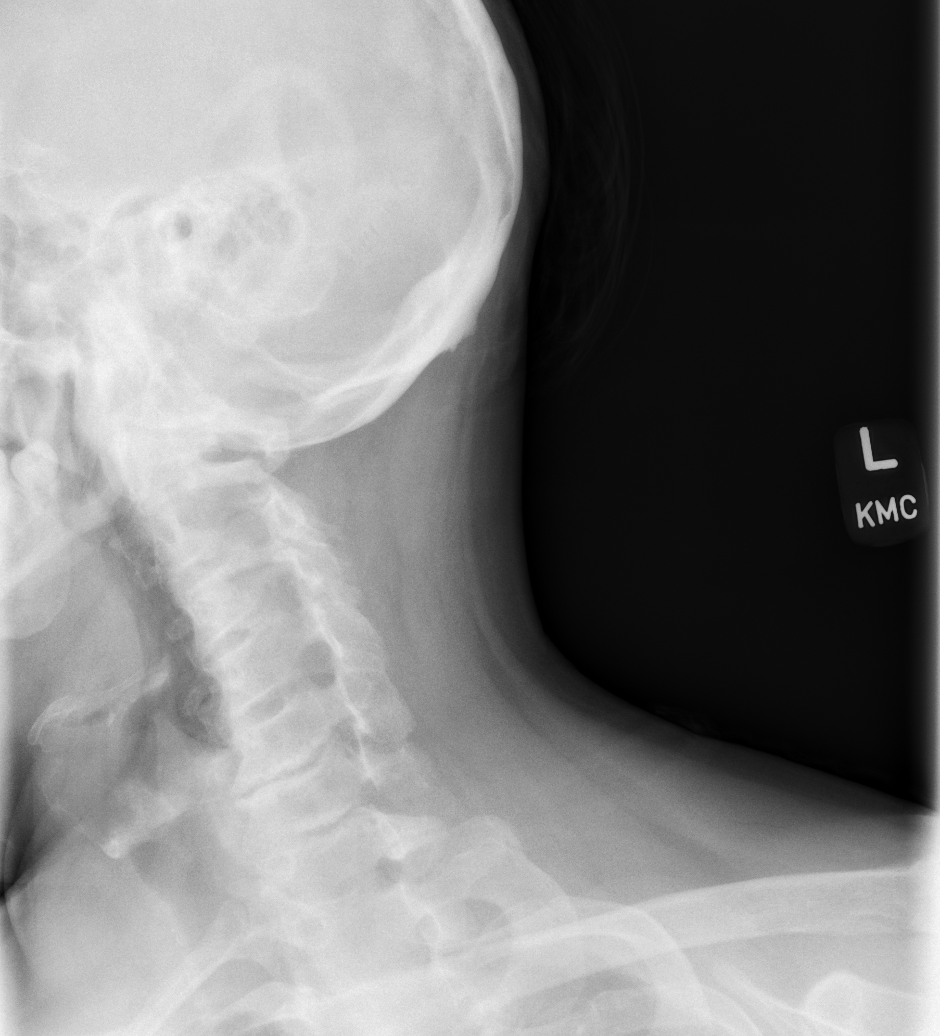

[w cervical spine ap]
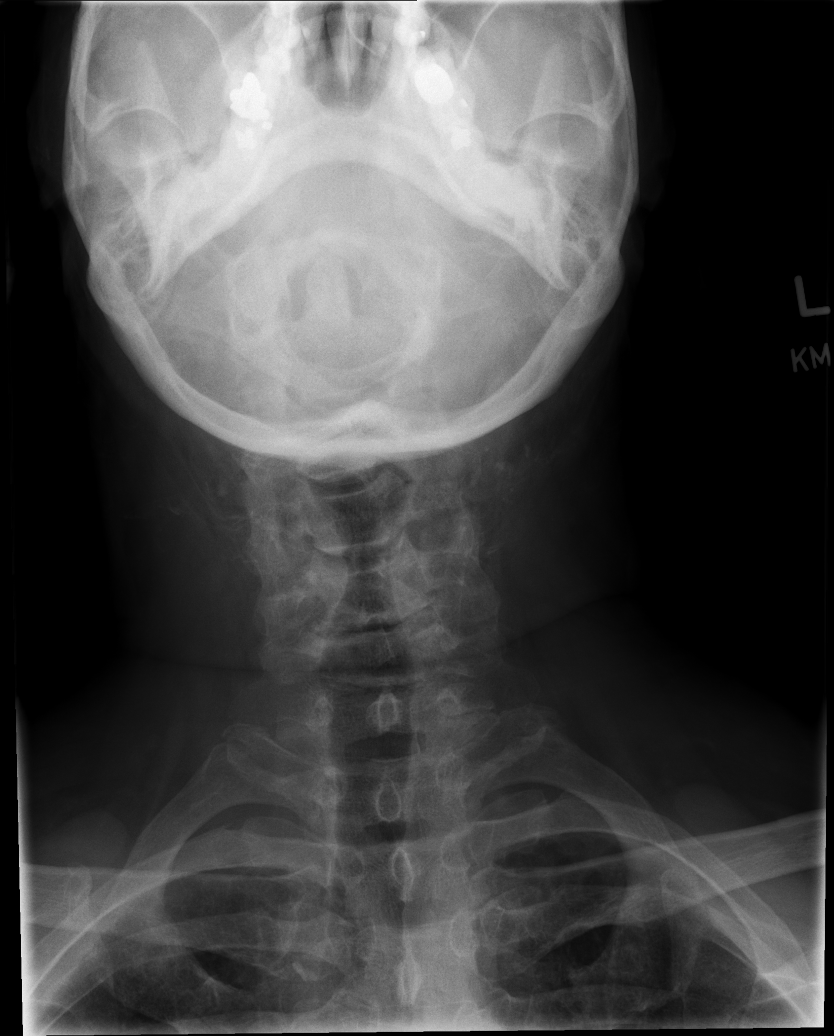

[w cervical spine odontoid (1 of 2)]
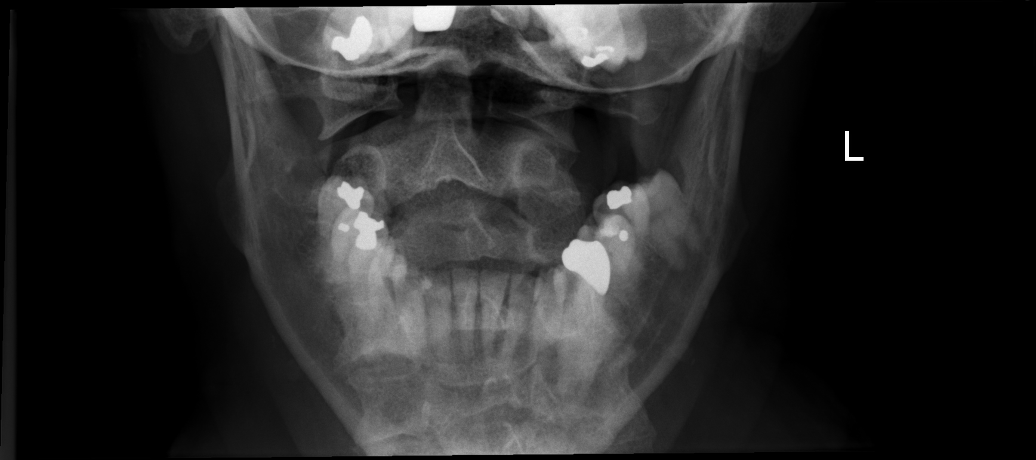

[w cervical spine odontoid (2 of 2)]
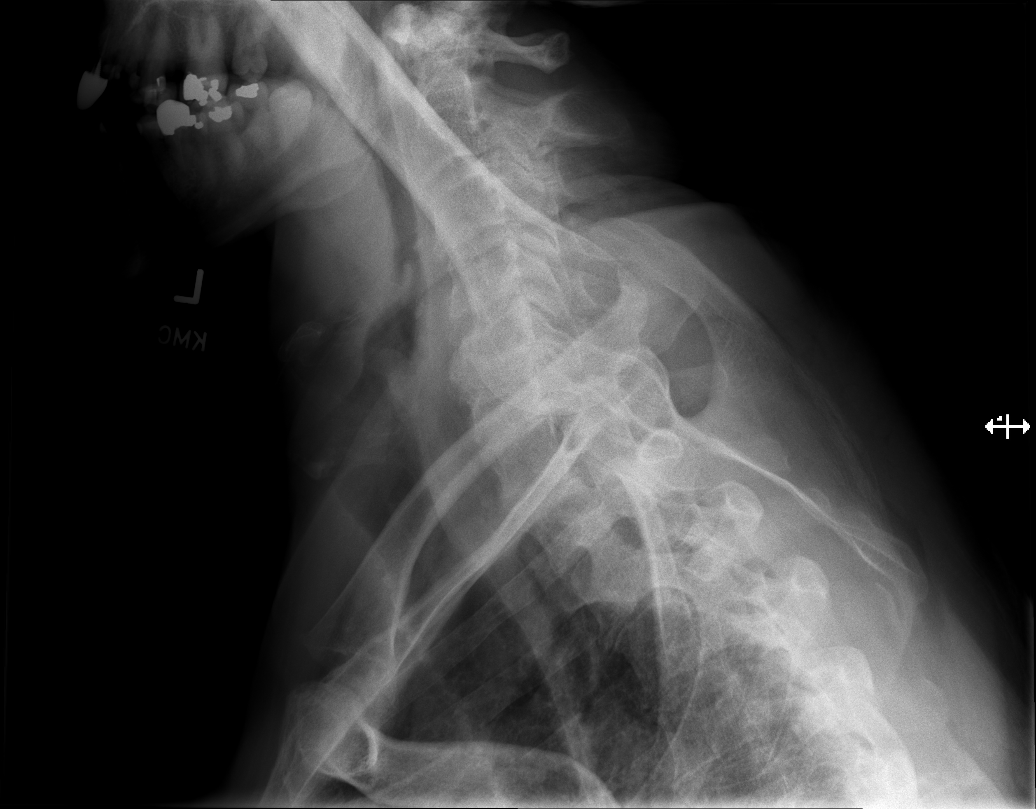

[6 of 6 positions shown; findings below may reference images not displayed]

FINDINGS: There is no evidence of cervical spine fracture or prevertebral soft
tissue swelling. Straightening of the cervical lordosis without
static listhesis. Oblique views reveal bony foraminal narrowing on
the right at C2-3 and C5-6 as well as bilaterally at C6-7.
Intervertebral disc height loss and endplate spurring is most
pronounced at C5-6 and C6-7. Overall findings progressed from prior.
IMPRESSION: Multilevel degenerative changes, most pronounced at C5-6 and C6-7.
Findings have progressed compared to prior imaging.

## 2021-04-29 MED ORDER — CYCLOBENZAPRINE HCL 5 MG PO TABS
5.0000 mg | ORAL_TABLET | Freq: Every evening | ORAL | 0 refills | Status: DC | PRN
  Filled 2021-04-29: qty 15, 15d supply, fill #0

## 2021-04-29 NOTE — Progress Notes (Signed)
[facility]  Provider:   Durenda Age  DNP  Code Status:  Full Code  Goals of Care:  Advanced Directives 04/29/2021  Does Patient Have a Medical Advance Directive? No  Would patient like information on creating a medical advance directive? No - Patient declined  Pre-existing out of facility DNR order (yellow form or pink MOST form) -     Chief Complaint  Patient presents with  . Acute Visit    Complains of knot on the back of neck.     HPI: Patient is a 65 y.o. male seen today for an acute visit for left neck pain. He stated that his pain started 2 months ago and takes Advil PRN and ice packs. Today, his pain level is 7/10/ He said that he still able to do his normal activities even with neck pain. 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 He denies pain on his ears. no change in hearing nor vision.   Past Medical History:  Diagnosis Date  . Abdominal aortic aneurysm [facility])    sees Dr. Einar Gip for cardiac f/u, [phone]  . Arthritis    lower back  . Asthma    followed by Dr. Berdie Ogren  . Chronic airway obstruction, not elsewhere classified   . Dyspnea    normal PFT April 2012  . External hemorrhoids without mention of complication   . GERD (gastroesophageal reflux disease)   . Gout   . H/O hiatal hernia   . Hemorrhoids   . HTN (hypertension)    hx of sees Dr. Graylin Shiver  . Hyperlipidemia   . Insomnia, unspecified   . Other abnormal blood chemistry   . Other dyspnea and respiratory abnormality   . Other malaise and fatigue   . Other specified erythematous condition(695.89)   . Other testicular dysfunction   . Prostatitis, unspecified   . Rash 2011   left chest, biopsy 2012 Dr. Rozann Lesches, results pending  . Skin cancer   . Tobacco abuse     Past Surgical History:  Procedure Laterality Date  . ABDOMINAL AORTIC ENDOVASCULAR STENT GRAFT  09/22/2012   Procedure: ABDOMINAL AORTIC ENDOVASCULAR STENT GRAFT;  Surgeon: Mal Misty, MD;   Location: [place];  Service: Vascular;  Laterality: N/A;  GORE; ultrasound guided.  . APPENDECTOMY    . EVALUATION UNDER ANESTHESIA WITH HEMORRHOIDECTOMY N/A 12/31/2016   Procedure: EXAM UNDER ANESTHESIA WITH HEMORRHOIDECTOMY;  Surgeon: [name], MD;  Location: [place];  Service: General;  Laterality: N/A;  . SKIN CANCER EXCISION    . TOE SURGERY  01/2012   joint of left great toe  . TONSILLECTOMY    . TONSILLECTOMY    . TYMPANOPLASTY    . WRIST SURGERY     left    No Known Allergies  Outpatient Encounter Medications as of 04/29/2021  Medication Sig  . albuterol (PROVENTIL HFA;VENTOLIN HFA) 108 (90 Base) MCG/ACT inhaler Inhale 2 puffs into the lungs every 6 (six) hours as needed. For cough and wheezing.  Marland Kitchen ALPRAZolam (XANAX) 0.25 MG tablet Take 1 tablet (0.25 mg total) by mouth daily as needed for anxiety (to last 30 days).  Marland Kitchen aspirin EC 81 MG tablet Take 81 mg by mouth daily.  Marland Kitchen buPROPion (WELLBUTRIN XL) 150 MG 24 hr tablet TAKE ONE TABLET BY MOUTH ONCE DAILY FOR 3 DAYS, THEN INCREASE TO ONE BY MOUTH TWICE DAILY.  . fluticasone furoate-vilanterol (BREO ELLIPTA) 200-25 MCG/INH AEPB INHALE 1 PUFF INTO LUNGS  ONCE DAILY.  . hydrochlorothiazide (HYDRODIURIL) 25 MG tablet Take 1 tablet (25 mg total) by mouth daily.  Marland Kitchen losartan (COZAAR) 100 MG tablet Take 1 tablet (100 mg total) by mouth daily.  . pantoprazole (PROTONIX) 40 MG tablet TAKE 1 TABLET BY MOUTH ONCE A DAY  . sildenafil (VIAGRA) 50 MG tablet TAKE 1 TABLET BY MOUTH AS NEEDED FOR ERECTILE DYSFUNCTION  . simvastatin (ZOCOR) 20 MG tablet TAKE 1 TABLET BY MOUTH AT BEDTIME   No facility-administered encounter medications on file as of 04/29/2021.    Review of Systems:  Review of Systems  Constitutional: Negative for activity change, appetite change and fever.  HENT: Negative for congestion and ear pain.   Eyes: Negative for discharge and itching.  Respiratory: Negative for cough and wheezing.   Cardiovascular:  Negative for chest pain and leg swelling.  Gastrointestinal: Negative for abdominal distention, nausea and vomiting.  Genitourinary: Negative for difficulty urinating and dysuria.  Skin: Negative.   Psychiatric/Behavioral: Negative.     Health Maintenance  Topic Date Due  . Pneumococcal Vaccine 48-69 Years old (1 of 4 - PCV13) Never done  . COVID-19 Vaccine (2 - Booster for [employer] series) 09/29/2020  . INFLUENZA VACCINE  06/24/2021  . TETANUS/TDAP  12/07/2023  . COLONOSCOPY (Pts 45-22yrs Insurance coverage will need to be confirmed)  10/03/2026  . Zoster Vaccines- Shingrix  Completed  . HPV VACCINES  Aged Out  . Hepatitis C Screening  Discontinued  . HIV Screening  Discontinued    Physical Exam: Vitals:   04/29/21 1319  BP: 108/80  Pulse: 96  Resp: 16  Temp: (!) 97.3 F (36.3 C)  SpO2: 97%  Weight: 232 lb 9.6 oz (105.5 kg)  Height: 6' 1.03" (1.855 m)   Body mass index is 30.66 kg/m. Physical Exam Constitutional:      Appearance: Normal appearance.  HENT:     Head: Normocephalic and atraumatic.     Nose: Nose normal.  Eyes:     Conjunctiva/sclera: Conjunctivae normal.  Cardiovascular:     Rate and Rhythm: Normal rate and regular rhythm.     Pulses: Normal pulses.     Heart sounds: Normal heart sounds.  Pulmonary:     Effort: Pulmonary effort is normal.     Breath sounds: Normal breath sounds.  Abdominal:     General: Bowel sounds are normal.  Musculoskeletal:     Cervical back: Normal range of motion and neck supple.  Skin:    General: Skin is warm and dry.  Neurological:     General: No focal deficit present.     Mental Status: He is alert and oriented to person, place, and time.     Cranial Nerves: No cranial nerve deficit.  Psychiatric:        Mood and Affect: Mood normal.        Behavior: Behavior normal.     Labs reviewed: Basic Metabolic Panel: Recent Labs    10/01/20 0612 10/02/20 0534 03/11/21 0936  NA 138 139 140  K 3.6 4.2 4.4  CL 102  104 105  CO2 28 23 27   GLUCOSE 94 93 111*  BUN 7* 9 13  CREATININE 0.79 0.87 0.96  CALCIUM 8.6* 8.7* 9.2   Liver Function Tests: Recent Labs    09/30/20 1406 10/01/20 0612 03/11/21 0936  AST 36 27 27  ALT 37 31 32  ALKPHOS 68 61  --   BILITOT 0.8 0.8 0.4  PROT 7.3 6.6 6.6  ALBUMIN 4.0 3.6  --  CBC: Recent Labs    09/18/20 0805 09/30/20 1406 10/01/20 0612 10/02/20 0534 03/11/21 0936  WBC 6.5 8.8 7.2 7.0 7.8  NEUTROABS 3,920 5.7  --   --  4,789  HGB 15.6 14.7 14.0 14.6 16.2  HCT 45.1 43.1 42.6 44.1 48.9  MCV 94.0 93.7 96.4 95.7 91.1  PLT 285 244 241 261 295   Lipid Panel: Recent Labs    09/18/20 0805 03/11/21 0936  CHOL 163 160  HDL 47 52  LDLCALC 95 81  TRIG 116 170*  CHOLHDL 3.5 3.1   Lab Results  Component Value Date   HGBA1C 5.4 09/18/2020      Assessment/Plan  1. Neck pain on left side -   Will start on PRN Flexeril and to continue PRN Advil for pain - DG Cervical Spine Complete -   Discussed not to take Flexeril together with Xanax  - cyclobenzaprine (FLEXERIL) 5 MG tablet; Take 1 tablet (5 mg total) by mouth at bedtime as needed for up to 15 days for muscle spasms (Do not take together with Xanax).  Dispense: 15 tablet; Refill: 0    Labs/tests ordered:  X-ray of cervical spine  Next appt:  10/07/2021

## 2021-04-29 NOTE — Patient Instructions (Signed)

## 2021-04-30 NOTE — Progress Notes (Signed)
Cervical imaging showed: Multilevel degenerative changes, most pronounced at C5-6 and C6-7. Findings have progressed compared to prior imaging. Will need referral to neurosurgery and spine.

## 2021-05-01 NOTE — Progress Notes (Signed)
Advil 400 mg every 6 hours as needed (with food) and the Flexeril 6 mg at bedtime should be good for the pain. Remember not to take Flexeril with Xanax.

## 2021-05-02 ENCOUNTER — Other Ambulatory Visit: Payer: Self-pay

## 2021-05-13 ENCOUNTER — Other Ambulatory Visit: Payer: Self-pay | Admitting: Adult Health

## 2021-05-13 ENCOUNTER — Other Ambulatory Visit: Payer: Self-pay | Admitting: Neurosurgery

## 2021-05-13 ENCOUNTER — Other Ambulatory Visit (HOSPITAL_COMMUNITY): Payer: Self-pay

## 2021-05-13 ENCOUNTER — Other Ambulatory Visit (HOSPITAL_COMMUNITY): Payer: Self-pay | Admitting: Neurosurgery

## 2021-05-13 ENCOUNTER — Other Ambulatory Visit: Payer: Self-pay | Admitting: Nurse Practitioner

## 2021-05-13 DIAGNOSIS — I10 Essential (primary) hypertension: Secondary | ICD-10-CM

## 2021-05-13 DIAGNOSIS — M47812 Spondylosis without myelopathy or radiculopathy, cervical region: Secondary | ICD-10-CM | POA: Diagnosis not present

## 2021-05-13 DIAGNOSIS — M542 Cervicalgia: Secondary | ICD-10-CM

## 2021-05-13 MED ORDER — HYDROCHLOROTHIAZIDE 25 MG PO TABS
25.0000 mg | ORAL_TABLET | Freq: Every day | ORAL | 1 refills | Status: DC
  Filled 2021-05-13: qty 30, 30d supply, fill #0
  Filled 2021-06-12: qty 30, 30d supply, fill #1

## 2021-05-13 MED ORDER — CYCLOBENZAPRINE HCL 5 MG PO TABS
5.0000 mg | ORAL_TABLET | Freq: Every evening | ORAL | 0 refills | Status: AC | PRN
  Filled 2021-05-13: qty 15, 15d supply, fill #0

## 2021-05-13 MED ORDER — DICLOFENAC POTASSIUM 50 MG PO TABS
ORAL_TABLET | ORAL | 0 refills | Status: DC
  Filled 2021-05-13: qty 60, 30d supply, fill #0

## 2021-05-13 NOTE — Telephone Encounter (Signed)
Patient called wanting a refill. Stated that he was told to take until he could get in with a Neurologist.   Pended Rx and sent to [facility] for approval.

## 2021-05-29 ENCOUNTER — Other Ambulatory Visit (HOSPITAL_COMMUNITY): Payer: Self-pay

## 2021-06-07 ENCOUNTER — Other Ambulatory Visit: Payer: Self-pay

## 2021-06-07 ENCOUNTER — Ambulatory Visit (HOSPITAL_COMMUNITY)
Admission: RE | Admit: 2021-06-07 | Discharge: 2021-06-07 | Disposition: A | Discharge status: Home / Self Care | Payer: 59 | Source: Ambulatory Visit | Attending: Neurosurgery | Admitting: Neurosurgery

## 2021-06-07 DIAGNOSIS — M47812 Spondylosis without myelopathy or radiculopathy, cervical region: Secondary | ICD-10-CM | POA: Diagnosis not present

## 2021-06-07 DIAGNOSIS — M542 Cervicalgia: Secondary | ICD-10-CM | POA: Diagnosis not present

## 2021-06-07 IMAGING — MR MR CERVICAL SPINE W/O CM
5 series · 40 of 48 positions shown · non-contrast
Comparison: Cervical spine radiographs [DATE]

CLINICAL DATA: Chronic neck pain.

EXAM:
MRI CERVICAL SPINE WITHOUT CONTRAST
TECHNIQUE: Multiplanar, multisequence MR imaging of the cervical spine was
performed. No intravenous contrast was administered.

[Series 5: T2 · sagittal · 3.0mm · 0.69mm/px · 6 of 15 slices shown (1 of 2)]
[im 1/15]
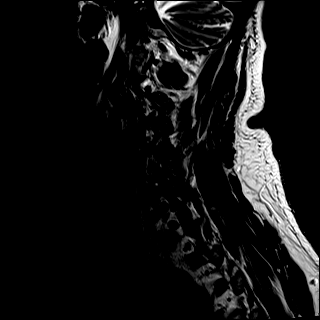
[im 3/15]
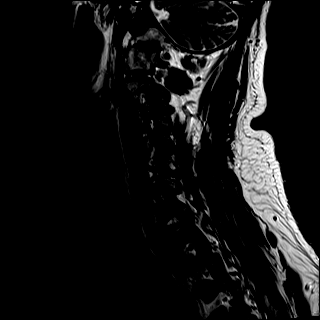
[im 6/15]
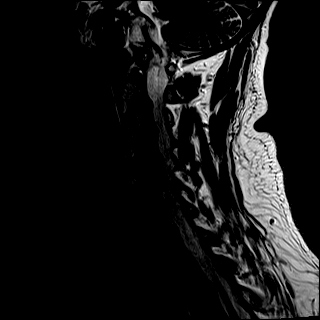
[im 9/15]
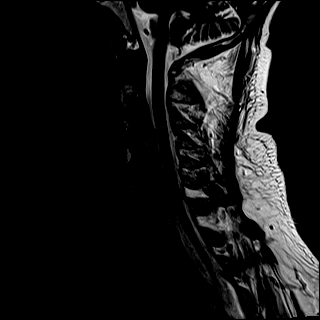
[im 12/15]
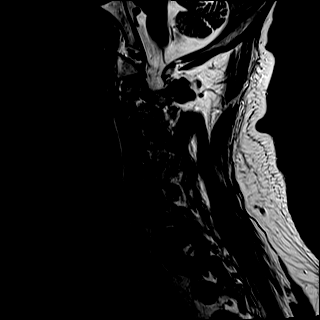
[im 15/15]
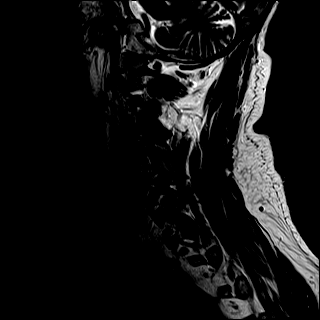

[Series 6: T1 · sagittal · 3.0mm · 0.86mm/px · 6 of 15 slices shown]
[im 1/15]
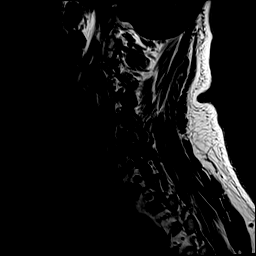
[im 3/15]
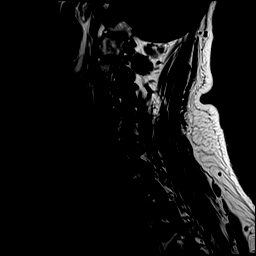
[im 6/15]
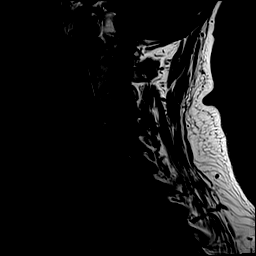
[im 9/15]
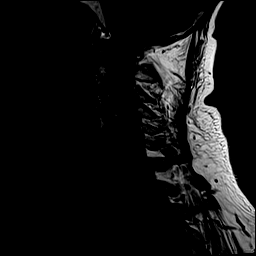
[im 12/15]
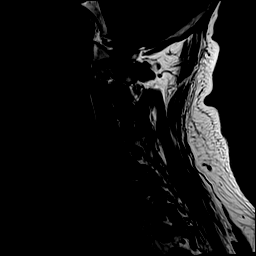
[im 15/15]
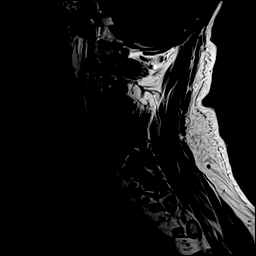

[Series 7: STIR · sagittal · 3.0mm · 0.69mm/px · 6 of 15 slices shown]
[im 1/15]
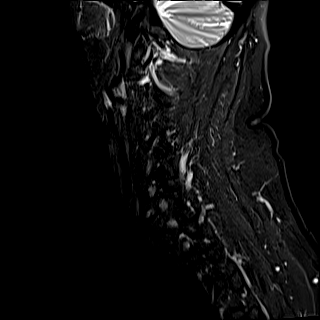
[im 3/15]
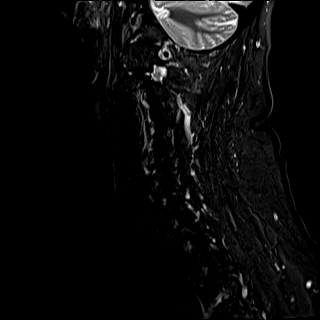
[im 6/15]
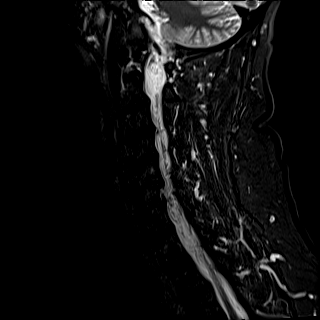
[im 9/15]
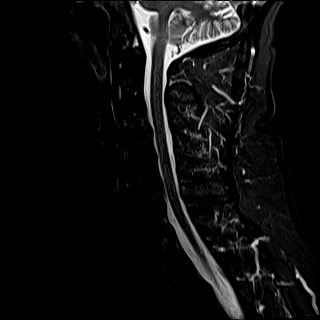
[im 12/15]
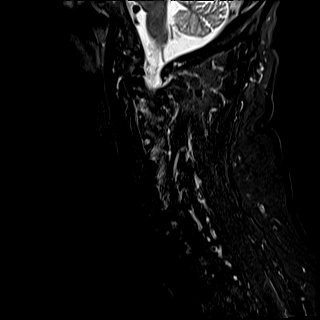
[im 15/15]
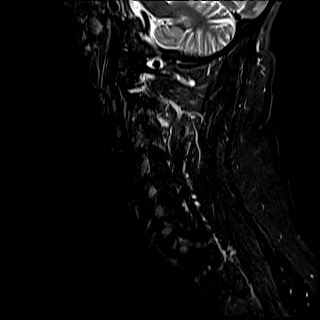

[Series 8: T2 · axial · 3.0mm · 0.70mm/px · z∈[-50,+79]mm · 14 of 40 slices shown (2 of 2)]
[im 1/40]
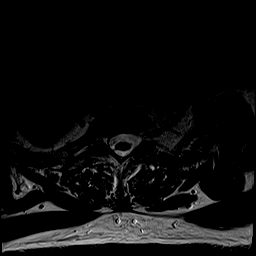
[im 3/40]
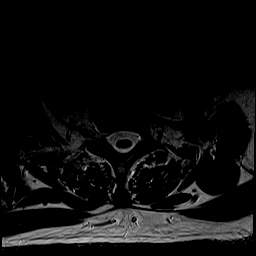
[im 6/40]
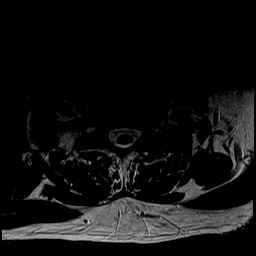
[im 9/40]
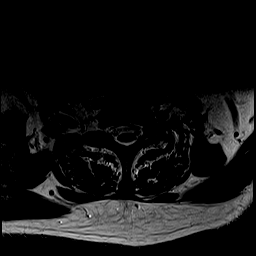
[im 12/40]
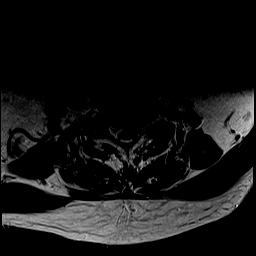
[im 14/40]
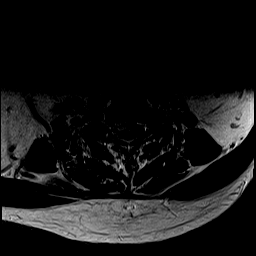
[im 17/40]
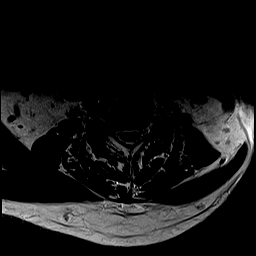
[im 20/40]
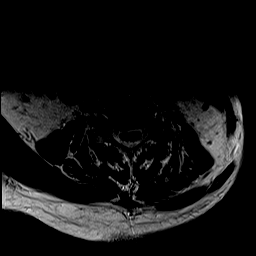
[im 23/40]
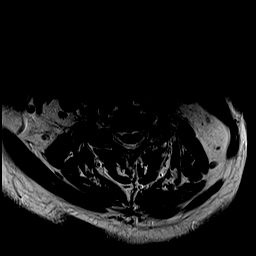
[im 26/40]
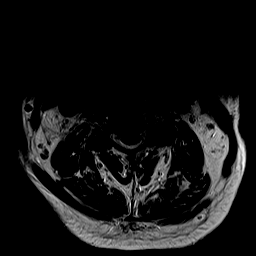
[im 28/40]
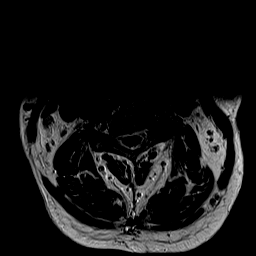
[im 31/40]
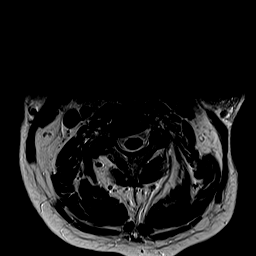
[im 34/40]
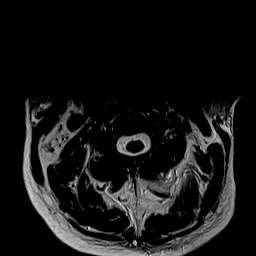
[im 40/40]
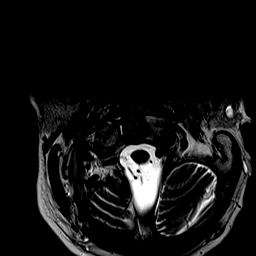

[Series 9: GRE · axial · 3.0mm · 0.35mm/px · z∈[-50,+79]mm · 8 of 40 slices shown]
[im 1/40]
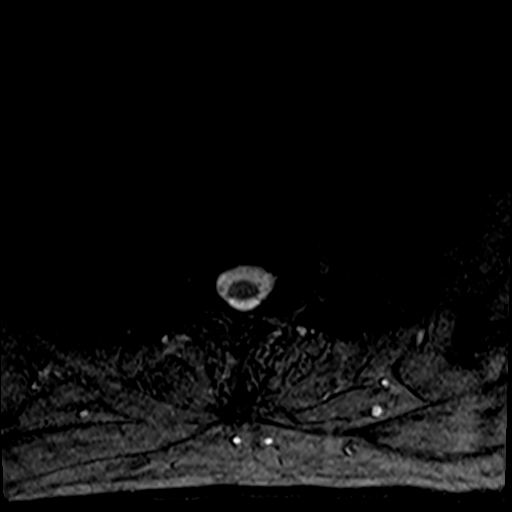
[im 6/40]
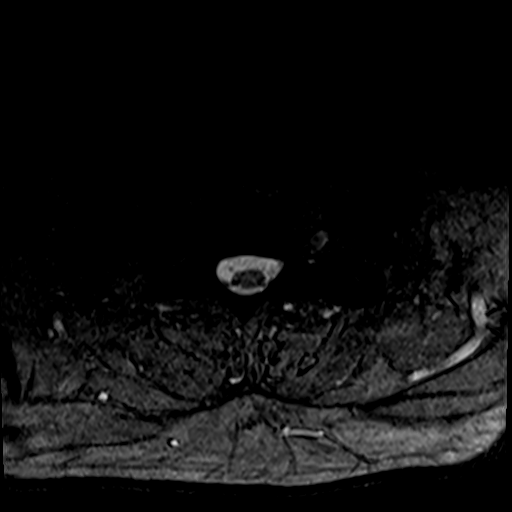
[im 12/40]
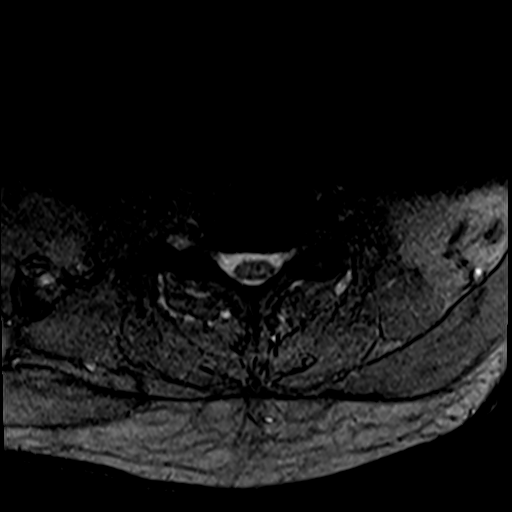
[im 17/40]
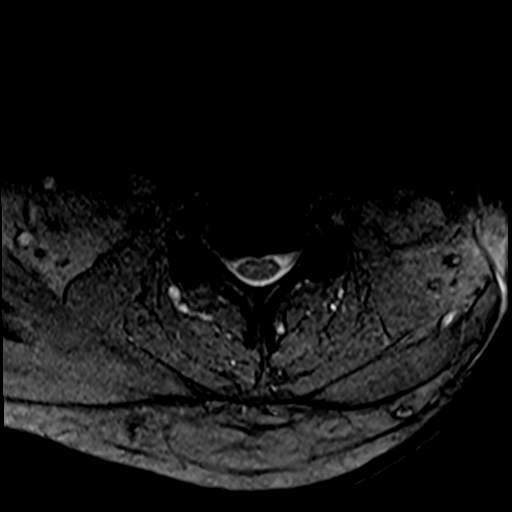
[im 23/40]
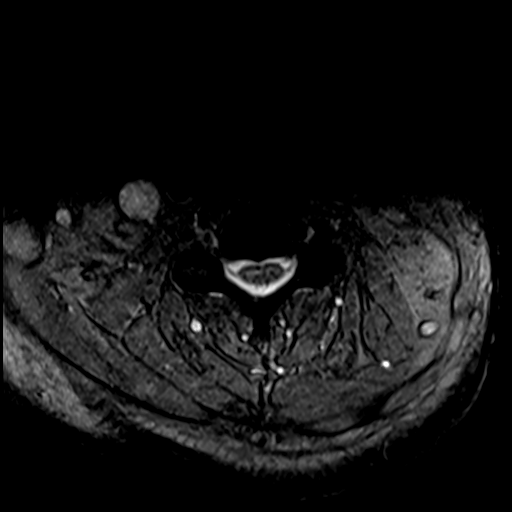
[im 28/40]
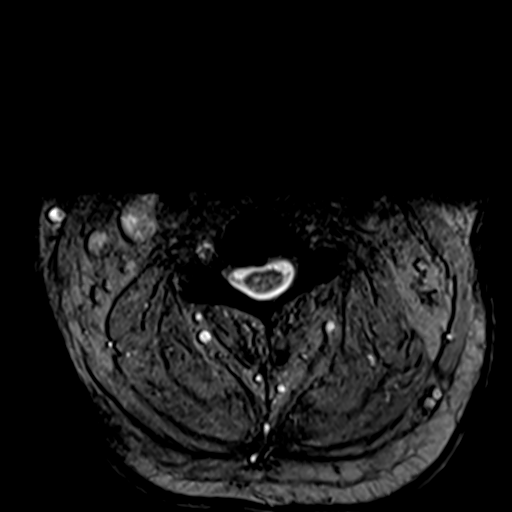
[im 34/40]
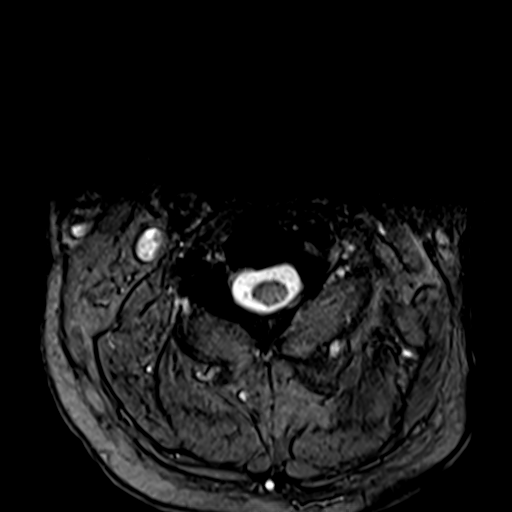
[im 40/40]
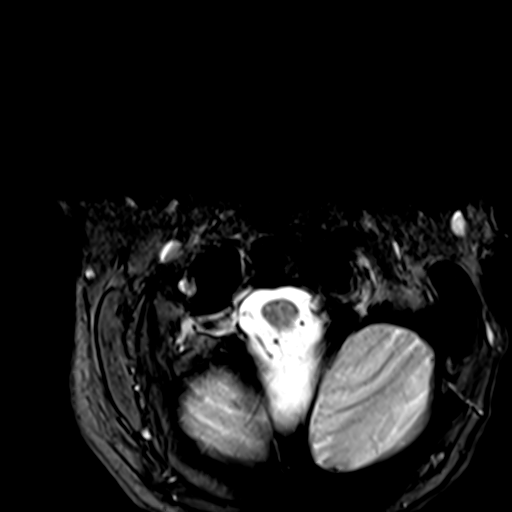

[40 of 48 positions shown; findings below may reference images not displayed]

FINDINGS: Alignment: Mild retrolisthesis C4-5 and C5-6.

Vertebrae: Negative for fracture or mass.

Cord: Normal signal and morphology

Posterior Fossa, vertebral arteries, paraspinal tissues: Negative

Disc levels:

C2-3: Moderate right foraminal encroachment due to facet hypertrophy
and uncinate spurring. Spinal canal adequate in size

C3-4: Asymmetric facet degeneration on the left with bony overgrowth
and bone marrow edema. Moderate to severe left foraminal
encroachment. Mild right foraminal narrowing due to spurring.

C4-5: Disc degeneration with mild uncinate spurring. Mild foraminal
narrowing bilaterally

C5-6: Disc degeneration with diffuse uncinate spurring. Moderate to
severe right foraminal encroachment. Mild left foraminal stenosis.
Mild spinal stenosis

C6-7: Disc degeneration with diffuse uncinate spurring. Mild to
moderate foraminal narrowing bilaterally

C7-T1: Disc degeneration with diffuse uncinate spurring and
bilateral facet degeneration. Mild left foraminal stenosis.
IMPRESSION: Multilevel disc and facet degeneration. Multilevel foraminal
stenosis due to spurring.

## 2021-06-12 ENCOUNTER — Other Ambulatory Visit: Payer: Self-pay | Admitting: Nurse Practitioner

## 2021-06-12 ENCOUNTER — Other Ambulatory Visit (HOSPITAL_COMMUNITY): Payer: Self-pay

## 2021-06-12 DIAGNOSIS — F419 Anxiety disorder, unspecified: Secondary | ICD-10-CM

## 2021-06-12 MED ORDER — DICLOFENAC POTASSIUM 50 MG PO TABS
ORAL_TABLET | ORAL | 2 refills | Status: DC
  Filled 2021-06-12: qty 60, 30d supply, fill #0
  Filled 2021-08-05: qty 60, 30d supply, fill #1
  Filled 2021-09-24: qty 60, 30d supply, fill #2

## 2021-06-12 MED ORDER — ALPRAZOLAM 0.25 MG PO TABS
0.2500 mg | ORAL_TABLET | Freq: Every day | ORAL | 1 refills | Status: DC | PRN
Start: 2021-06-12 — End: 2021-08-15
  Filled 2021-06-12: qty 20, 30d supply, fill #0
  Filled 2021-07-16: qty 20, 30d supply, fill #1

## 2021-06-12 NOTE — Telephone Encounter (Signed)
Medication pended and sent to Marlowe Sax, NP for approval Sherrie Mustache, NP out of office

## 2021-06-14 DIAGNOSIS — M9901 Segmental and somatic dysfunction of cervical region: Secondary | ICD-10-CM | POA: Diagnosis not present

## 2021-06-14 DIAGNOSIS — M542 Cervicalgia: Secondary | ICD-10-CM | POA: Diagnosis not present

## 2021-06-14 DIAGNOSIS — M546 Pain in thoracic spine: Secondary | ICD-10-CM | POA: Diagnosis not present

## 2021-06-14 DIAGNOSIS — M9902 Segmental and somatic dysfunction of thoracic region: Secondary | ICD-10-CM | POA: Diagnosis not present

## 2021-06-17 ENCOUNTER — Other Ambulatory Visit: Payer: Self-pay | Admitting: Nurse Practitioner

## 2021-06-17 ENCOUNTER — Other Ambulatory Visit (HOSPITAL_COMMUNITY): Payer: Self-pay

## 2021-06-17 DIAGNOSIS — E782 Mixed hyperlipidemia: Secondary | ICD-10-CM

## 2021-06-17 MED ORDER — SIMVASTATIN 20 MG PO TABS
ORAL_TABLET | Freq: Every day | ORAL | 1 refills | Status: DC
  Filled 2021-06-17: qty 90, 90d supply, fill #0
  Filled 2021-09-10: qty 90, 90d supply, fill #1

## 2021-06-24 DIAGNOSIS — M47812 Spondylosis without myelopathy or radiculopathy, cervical region: Secondary | ICD-10-CM | POA: Diagnosis not present

## 2021-06-28 ENCOUNTER — Other Ambulatory Visit: Payer: Self-pay | Admitting: Nurse Practitioner

## 2021-06-28 ENCOUNTER — Other Ambulatory Visit (HOSPITAL_COMMUNITY): Payer: Self-pay

## 2021-06-28 DIAGNOSIS — F17200 Nicotine dependence, unspecified, uncomplicated: Secondary | ICD-10-CM

## 2021-06-28 DIAGNOSIS — K219 Gastro-esophageal reflux disease without esophagitis: Secondary | ICD-10-CM

## 2021-06-28 MED ORDER — PANTOPRAZOLE SODIUM 40 MG PO TBEC
DELAYED_RELEASE_TABLET | Freq: Every day | ORAL | 1 refills | Status: DC
  Filled 2021-06-28: qty 90, 90d supply, fill #0
  Filled 2021-09-24: qty 90, 90d supply, fill #1

## 2021-06-28 MED ORDER — BUPROPION HCL ER (XL) 150 MG PO TB24
ORAL_TABLET | ORAL | 3 refills | Status: DC
  Filled 2021-06-28: qty 60, 30d supply, fill #0
  Filled 2021-08-01: qty 60, 30d supply, fill #1
  Filled 2021-09-03: qty 60, 30d supply, fill #2
  Filled 2021-10-04: qty 60, 30d supply, fill #3

## 2021-07-01 ENCOUNTER — Other Ambulatory Visit (HOSPITAL_COMMUNITY): Payer: Self-pay

## 2021-07-11 ENCOUNTER — Other Ambulatory Visit (HOSPITAL_COMMUNITY): Payer: Self-pay

## 2021-07-11 ENCOUNTER — Other Ambulatory Visit: Payer: Self-pay | Admitting: Nurse Practitioner

## 2021-07-11 DIAGNOSIS — N529 Male erectile dysfunction, unspecified: Secondary | ICD-10-CM

## 2021-07-11 MED ORDER — SILDENAFIL CITRATE 50 MG PO TABS
ORAL_TABLET | ORAL | 1 refills | Status: DC
  Filled 2021-07-11: qty 6, 30d supply, fill #0
  Filled 2021-08-15: qty 6, 30d supply, fill #1
  Filled 2021-09-26: qty 6, 30d supply, fill #2

## 2021-07-16 ENCOUNTER — Other Ambulatory Visit: Payer: Self-pay | Admitting: Nurse Practitioner

## 2021-07-16 ENCOUNTER — Other Ambulatory Visit (HOSPITAL_COMMUNITY): Payer: Self-pay

## 2021-07-16 DIAGNOSIS — J4540 Moderate persistent asthma, uncomplicated: Secondary | ICD-10-CM

## 2021-07-16 DIAGNOSIS — I10 Essential (primary) hypertension: Secondary | ICD-10-CM

## 2021-07-16 MED ORDER — FLUTICASONE FUROATE-VILANTEROL 200-25 MCG/INH IN AEPB
INHALATION_SPRAY | RESPIRATORY_TRACT | 3 refills | Status: DC
  Filled 2021-07-16: qty 60, 30d supply, fill #0
  Filled 2021-08-19: qty 60, 30d supply, fill #1
  Filled 2021-09-10: qty 60, 30d supply, fill #2

## 2021-07-16 MED ORDER — HYDROCHLOROTHIAZIDE 25 MG PO TABS
25.0000 mg | ORAL_TABLET | Freq: Every day | ORAL | 1 refills | Status: DC
  Filled 2021-07-16: qty 30, 30d supply, fill #0
  Filled 2021-08-12: qty 30, 30d supply, fill #1

## 2021-07-19 ENCOUNTER — Other Ambulatory Visit: Payer: Self-pay

## 2021-07-19 ENCOUNTER — Encounter: Payer: Self-pay | Admitting: Family

## 2021-07-19 ENCOUNTER — Telehealth: Payer: Self-pay

## 2021-07-19 ENCOUNTER — Telehealth (INDEPENDENT_AMBULATORY_CARE_PROVIDER_SITE_OTHER): Payer: 59 | Admitting: Family

## 2021-07-19 VITALS — BP 137/90 | HR 80 | Temp 97.5°F

## 2021-07-19 DIAGNOSIS — J069 Acute upper respiratory infection, unspecified: Secondary | ICD-10-CM

## 2021-07-19 DIAGNOSIS — U071 COVID-19: Secondary | ICD-10-CM | POA: Diagnosis not present

## 2021-07-19 MED ORDER — NIRMATRELVIR/RITONAVIR (PAXLOVID) TABLET (RENAL DOSING): 2.0000 | ORAL_TABLET | Freq: Two times a day (BID) | ORAL | 0 refills | Status: AC

## 2021-07-19 MED ORDER — GUAIFENESIN ER 600 MG PO TB12: 600.0000 mg | ORAL_TABLET | Freq: Two times a day (BID) | ORAL | 0 refills | Status: AC

## 2021-07-19 NOTE — Telephone Encounter (Signed)
Spoke with patient and his wife (speaker phone) and discuss additional instructions from Marlowe Sax, NP  Patient is to HOLD simvastatin and Viagra x 5 days while on taking medications to treat covid. Patient verbalized understanding   Patient asked if there are any symptoms that he should expect from the antiviral and I informed him that he should pose that questions to the pharmacist.

## 2021-07-19 NOTE — Progress Notes (Signed)
This service is provided via telemedicine  No vital signs collected/recorded due to the encounter was a telemedicine visit.   Location of patient (ex: home, work):  Home  Patient consents to a telephone visit: Yes, see telephone visit dated 07/19/21  Location of the provider (ex: office, home):  [facility], Office   Name of any referring provider:  N/A  Names of all persons participating in the telemedicine service and their role in the encounter:  Mickey Farber W./CMA, Saxton Antoinett Dorman, NP, and Patient   Time spent on call:  7 min with medical assistant     Provider: Makiya Jeune FNP-C  [name], NP  [name], NP as PCP - General (Nurse Practitioner) [name], [name], NP (Inactive) as Nurse Practitioner (Vascular Surgery) [name], MD as Consulting Physician (General Surgery) Brand Males, MD as Consulting Physician (Pulmonary Disease) [name], MD as Consulting Physician (Cardiology) Starlyn Skeans as Physician Assistant (Dermatology)  Extended Emergency Contact Information Primary Emergency Contact: Aaron [name] Address: [address]          [place], [address] [address] Johnnette Litter of [employer] Phone: [phone] Mobile Phone: [phone] Relation: Spouse  Code Status:  Full Code  Goals of care: Advanced Directive information Advanced Directives 04/29/2021  Does Patient Have a Medical Advance Directive? No  Would patient like information on creating a medical advance directive? No - Patient declined  Pre-existing out of facility DNR order (yellow form or pink MOST form) -     Chief Complaint  Patient presents with   Acute Visit    Patient tested positive for covid and is not feeling well. Visit completed via telephone call.    HPI:  Pt is a 65 y.o. male seen today for an acute visit for evaluation of COVID-19 positive.He went to family reunion on Saturday.No facial  mask during the visit.Does not recall contact with sick person with COVID-19.Had diarrhea on Monday then Tuesday after work had a fever 100.0 Wednesday and had another episode of diarrhea.No diarrhea today just Nasal congestion and cough.  Already taking vit D,C and Zinc  Had a [employer] covid-19  1st   Past Medical History:  Diagnosis Date   Abdominal aortic aneurysm [facility])    sees Dr. Einar Gip for cardiac f/u, [phone]   Arthritis    lower back   Asthma    followed by Dr. Berdie Ogren   Chronic airway obstruction, not elsewhere classified    Dyspnea    normal PFT April 2012   External hemorrhoids without mention of complication    GERD (gastroesophageal reflux disease)    Gout    H/O hiatal hernia    Hemorrhoids    HTN (hypertension)    hx of sees Dr. Graylin Shiver   Hyperlipidemia    Insomnia, unspecified    Other abnormal blood chemistry    Other dyspnea and respiratory abnormality    Other malaise and fatigue    Other specified erythematous condition(695.89)    Other testicular dysfunction    Prostatitis, unspecified    Rash 2011   left chest, biopsy 2012 Dr. Rozann Lesches, results pending   Skin cancer    Tobacco abuse    Past Surgical History:  Procedure Laterality Date   ABDOMINAL AORTIC ENDOVASCULAR STENT GRAFT  09/22/2012   Procedure: ABDOMINAL AORTIC ENDOVASCULAR STENT GRAFT;  Surgeon: Mal Misty, MD;  Location: [place];  Service: Vascular;  Laterality: N/A;  GORE; ultrasound guided.  APPENDECTOMY     EVALUATION UNDER ANESTHESIA WITH HEMORRHOIDECTOMY N/A 12/31/2016   Procedure: EXAM UNDER ANESTHESIA WITH HEMORRHOIDECTOMY;  Surgeon: [name], MD;  Location: [place];  Service: General;  Laterality: N/A;   SKIN CANCER EXCISION     TOE SURGERY  01/2012   joint of left great toe   TONSILLECTOMY     TONSILLECTOMY     TYMPANOPLASTY     WRIST SURGERY     left    No Known Allergies  Outpatient Encounter Medications as of 07/19/2021  Medication  Sig   acetaminophen (TYLENOL) 500 MG tablet Take 1,000 mg by mouth every 6 (six) hours as needed.   albuterol (PROVENTIL HFA;VENTOLIN HFA) 108 (90 Base) MCG/ACT inhaler Inhale 2 puffs into the lungs every 6 (six) hours as needed. For cough and wheezing.   ALPRAZolam (XANAX) 0.25 MG tablet Take 1 tablet (0.25 mg total) by mouth daily as needed for anxiety (to last 30 days).   Ascorbic Acid (VITAMIN C) 1000 MG tablet Take 1,000 mg by mouth daily.   aspirin EC 81 MG tablet Take 81 mg by mouth daily.   buPROPion (WELLBUTRIN XL) 150 MG 24 hr tablet TAKE ONE TABLET BY MOUTH ONCE DAILY FOR 3 DAYS, THEN INCREASE TO ONE BY MOUTH TWICE DAILY.   cholecalciferol (VITAMIN D3) 25 MCG (1000 UNIT) tablet Take 1,000 Units by mouth daily.   diclofenac (CATAFLAM) 50 MG tablet Take 1 tablet by mouth 2 times every day as needed   diclofenac (CATAFLAM) 50 MG tablet Take 1 tablet by mouth 2 times every day as needed   fluticasone furoate-vilanterol (BREO ELLIPTA) 200-25 MCG/INH AEPB INHALE 1 PUFF INTO LUNGS ONCE DAILY.   guaiFENesin-dextromethorphan (ROBITUSSIN DM) 100-10 MG/5ML syrup Take 5 mLs by mouth every 4 (four) hours as needed for cough.   hydrochlorothiazide (HYDRODIURIL) 25 MG tablet Take 1 tablet (25 mg total) by mouth daily.   losartan (COZAAR) 100 MG tablet Take 1 tablet (100 mg total) by mouth daily.   pantoprazole (PROTONIX) 40 MG tablet TAKE 1 TABLET BY MOUTH ONCE A DAY   PE-GG-APAP & PE-DPH-APAP (MUCINEX SINUS-MAX DAY/NIGHT) 5-200-325 & 5-25-'325MG'$  TBPK Take by mouth as needed.   sildenafil (VIAGRA) 50 MG tablet TAKE 1 TABLET BY MOUTH AS NEEDED FOR ERECTILE DYSFUNCTION   simvastatin (ZOCOR) 20 MG tablet TAKE 1 TABLET BY MOUTH AT BEDTIME   zinc gluconate 50 MG tablet Take 50 mg by mouth daily.   No facility-administered encounter medications on file as of 07/19/2021.    Review of Systems  Constitutional:  Negative for appetite change, chills, fatigue, fever and unexpected weight change.  HENT:   Positive for congestion and rhinorrhea. Negative for dental problem, ear discharge, ear pain, facial swelling, hearing loss, nosebleeds, postnasal drip, sinus pressure, sinus pain, sneezing, sore throat, tinnitus and trouble swallowing.   Eyes:  Negative for pain, discharge, redness, itching and visual disturbance.  Respiratory:  Positive for cough. Negative for chest tightness, shortness of breath and wheezing.   Cardiovascular:  Negative for chest pain, palpitations and leg swelling.  Gastrointestinal:  Negative for abdominal distention, abdominal pain, blood in stool, constipation, diarrhea, nausea and vomiting.       Diarrhea per HPI   Musculoskeletal:  Negative for arthralgias, back pain, gait problem, joint swelling and myalgias.  Skin:  Negative for color change, pallor, rash and wound.  Neurological:  Negative for dizziness, speech difficulty, weakness, light-headedness, numbness and headaches.  Hematological:  Does not bruise/bleed easily.  Psychiatric/Behavioral:  Negative for agitation, behavioral problems, confusion, hallucinations and sleep disturbance. The patient is not nervous/anxious.    Immunization History  Administered Date(s) Administered   Influenza Split 09/20/2012   Influenza Whole 08/30/2019   Influenza,inj,Quad PF,6+ Mos 09/29/2013, 08/08/2014, 08/09/2015, 07/17/2016, 10/14/2017, 01/14/2019, 09/21/2020   Influenza-Unspecified 08/24/2017   Janssen (J&J) SARS-COV-2 Vaccination 08/04/2020   Pneumococcal Conjugate-13 12/12/2014, 12/12/2014   Pneumococcal Polysaccharide-23 06/30/2011   Tdap 12/06/2013   Zoster Recombinat (Shingrix) 06/13/2020, 09/07/2020   Pertinent  Health Maintenance Due  Topic Date Due   INFLUENZA VACCINE  06/24/2021   COLONOSCOPY (Pts 45-71yr Insurance coverage will need to be confirmed)  10/03/2026   Fall Risk  07/19/2021 04/29/2021 04/08/2021 03/15/2021 03/05/2021  Falls in the past year? 0 0 '1 1 1  '$ Number falls in past yr: 0 0 0 0 0  Injury with  Fall? - 0 0 0 0  Follow up Falls evaluation completed - - - -   Functional Status Survey:    Vitals:   07/19/21 1241 07/19/21 1244  BP: (!) 148/93 137/90  Pulse: 76 80  Temp: (!) 97.5 F (36.4 C)   SpO2: 97%    There is no height or weight on file to calculate BMI. Physical Exam Constitutional:      General: He is not in acute distress.    Appearance: Normal appearance.  HENT:     Head: Normocephalic.     Nose: Congestion and rhinorrhea present.  Pulmonary:     Effort: Respiratory distress present.  Musculoskeletal:        General: Normal range of motion.     Right lower leg: No edema.     Left lower leg: No edema.  Neurological:     Mental Status: He is alert and oriented to person, place, and time.  Psychiatric:        Mood and Affect: Mood normal.        Speech: Speech normal.        Behavior: Behavior normal.        Judgment: Judgment normal.    Labs reviewed: Recent Labs    10/01/20 0612 10/02/20 0534 03/11/21 0936  NA 138 139 140  K 3.6 4.2 4.4  CL 102 104 105  CO2 '28 23 27  '$ GLUCOSE 94 93 111*  BUN 7* 9 13  CREATININE 0.79 0.87 0.96  CALCIUM 8.6* 8.7* 9.2   Recent Labs    09/30/20 1406 10/01/20 0612 03/11/21 0936  AST 36 27 27  ALT 37 31 32  ALKPHOS 68 61  --   BILITOT 0.8 0.8 0.4  PROT 7.3 6.6 6.6  ALBUMIN 4.0 3.6  --    Recent Labs    09/18/20 0805 09/30/20 1406 10/01/20 0612 10/02/20 0534 03/11/21 0936  WBC 6.5 8.8 7.2 7.0 7.8  NEUTROABS 3,920 5.7  --   --  4,789  HGB 15.6 14.7 14.0 14.6 16.2  HCT 45.1 43.1 42.6 44.1 48.9  MCV 94.0 93.7 96.4 95.7 91.1  PLT 285 244 241 261 295   Lab Results  Component Value Date   TSH 2.24 12/24/2016   Lab Results  Component Value Date   HGBA1C 5.4 09/18/2020   Lab Results  Component Value Date   CHOL 160 03/11/2021   HDL 52 03/11/2021   LDLCALC 81 03/11/2021   TRIG 170 (H) 03/11/2021   CHOLHDL 3.1 03/11/2021    Significant Diagnostic Results in last 30 days:  No results  found.  Assessment/Plan  1. COVID-19 virus  RNA test result positive at limit of detection exposed to COVID-19 in a family reunion. Will start on Paxlovid side effects discussed. - advised to hold simvastatin and sildenafil  for 5 days while on Paxlovid then restart after completion.  - nirmatrelvir/ritonavir EUA, renal dosing, (PAXLOVID) 10 x 150 MG & 10 x '100MG'$  TABS; Take 2 tablets by mouth 2 (two) times daily for 5 days. (Take nirmatrelvir 150 mg one tablet twice daily for 5 days and ritonavir 100 mg one tablet twice daily for 5 days) Patient GFR is 84  Dispense: 20 tablet; Refill: 0 - guaiFENesin (MUCINEX) 600 MG 12 hr tablet; Take 1 tablet (600 mg total) by mouth 2 (two) times daily for 14 days.  Dispense: 28 tablet; Refill: 0  2. Upper respiratory tract infection due to COVID-19 virus Paxlovid as above  Mucinex as below for cough  - continue on Vitamin D 2000 units,Vitamin C 1000 mg tablet and Zinc 50 mg tablet daily for 14 days. - nirmatrelvir/ritonavir EUA, renal dosing, (PAXLOVID) 10 x 150 MG & 10 x '100MG'$  TABS; Take 2 tablets by mouth 2 (two) times daily for 5 days. (Take nirmatrelvir 150 mg one tablet twice daily for 5 days and ritonavir 100 mg one tablet twice daily for 5 days) Patient GFR is 84  Dispense: 20 tablet; Refill: 0 - guaiFENesin (MUCINEX) 600 MG 12 hr tablet; Take 1 tablet (600 mg total) by mouth 2 (two) times daily for 14 days.  Dispense: 28 tablet; Refill: 0 - advised to notify provider or go to ED if symptoms worsen or fail to improve.    Family/ staff Communication: Reviewed plan of care with patient and wife verbalized understanding.  Labs/tests ordered: None   Next Appointment: As needed if symptoms worsen or fail to improve   I connected with  Chesley Mires on 07/21/21 by a video enabled telemedicine application and verified that I am speaking with the correct person using two identifiers.   I discussed the limitations of evaluation and management by  telemedicine. The patient expressed understanding and agreed to proceed.  Spent 12 minutes of non-face to face with patient      Sandrea Hughs, NP

## 2021-07-19 NOTE — Telephone Encounter (Signed)
Patient consented to telehealth visit. 

## 2021-07-22 ENCOUNTER — Ambulatory Visit (INDEPENDENT_AMBULATORY_CARE_PROVIDER_SITE_OTHER): Payer: 59

## 2021-07-22 ENCOUNTER — Ambulatory Visit
Admission: RE | Admit: 2021-07-22 | Discharge: 2021-07-22 | Disposition: A | Discharge status: Home / Self Care | Payer: 59 | Source: Ambulatory Visit | Attending: Family Medicine | Admitting: Family Medicine

## 2021-07-22 ENCOUNTER — Other Ambulatory Visit: Payer: Self-pay

## 2021-07-22 VITALS — BP 153/84 | HR 97 | Temp 98.1°F | Resp 19

## 2021-07-22 DIAGNOSIS — M79641 Pain in right hand: Secondary | ICD-10-CM

## 2021-07-22 DIAGNOSIS — S6991XA Unspecified injury of right wrist, hand and finger(s), initial encounter: Secondary | ICD-10-CM | POA: Diagnosis not present

## 2021-07-22 DIAGNOSIS — M7989 Other specified soft tissue disorders: Secondary | ICD-10-CM | POA: Diagnosis not present

## 2021-07-22 IMAGING — DX DG HAND COMPLETE 3+V*R*
3 series · 3 of 3 positions shown · non-contrast
Comparison: None

CLINICAL DATA: Redness at top of RIGHT hand after putting all his
weight on that hand to push himself up, pain

EXAM:
RIGHT HAND - COMPLETE 3+ VIEW

[hand pa]
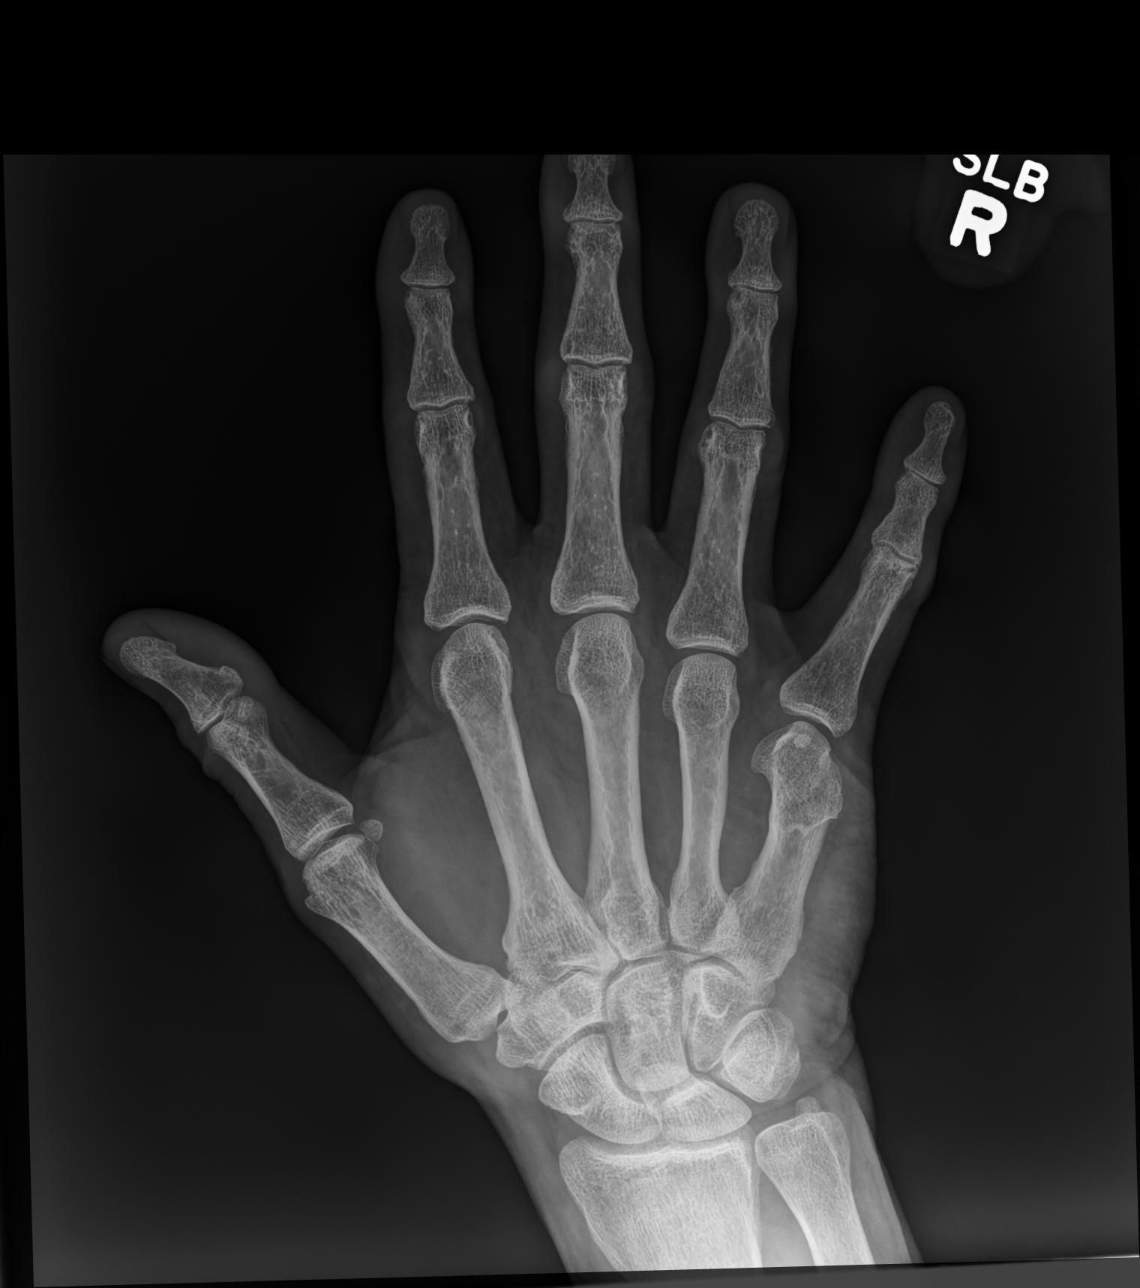

[hand mlo]
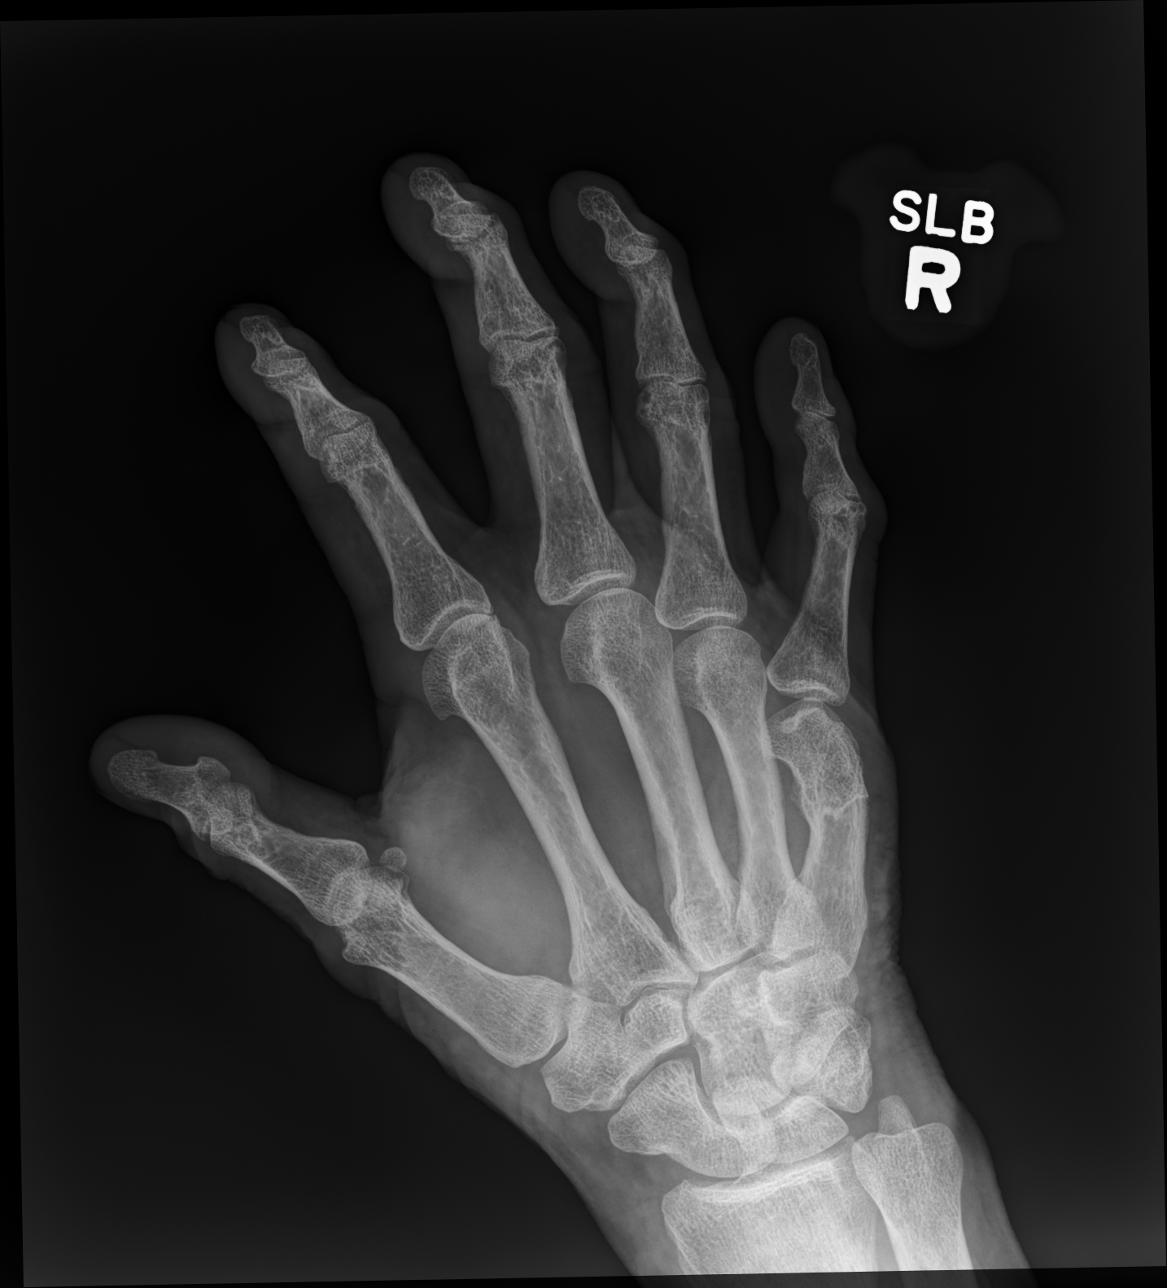

[hand lat]
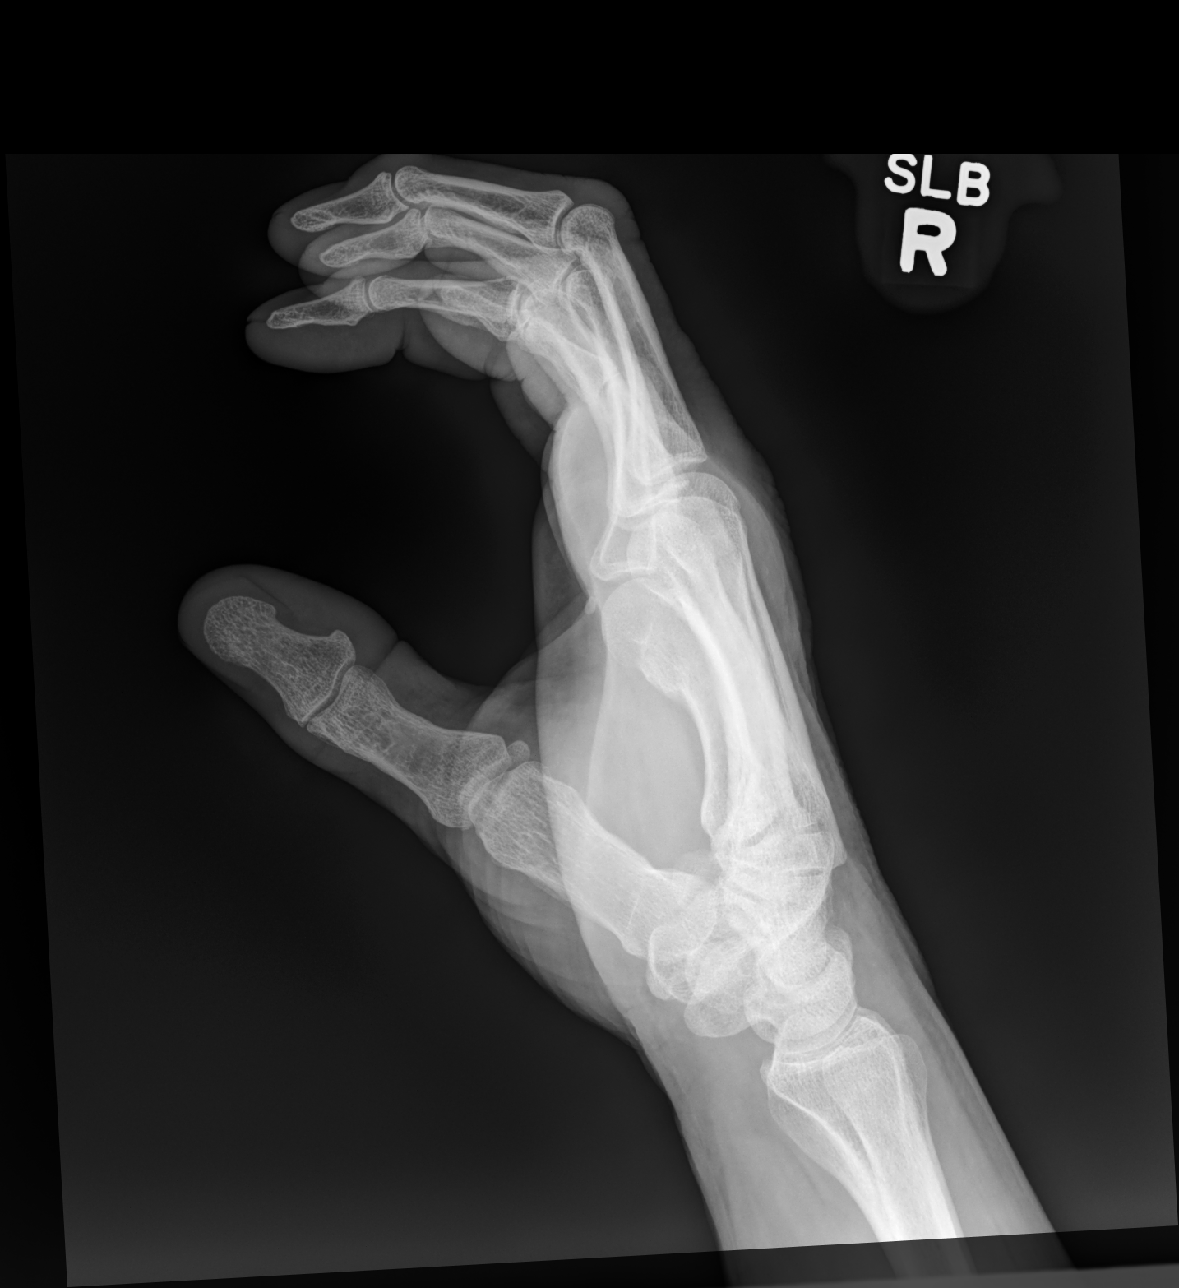

[3 of 3 positions shown; findings below may reference images not displayed]

FINDINGS: Osseous mineralization low normal.

Joint spaces preserved.

Old healed fracture distal fifth metacarpal.

No acute fracture, dislocation, or bone destruction.

Fingers superimposed on lateral view limiting assessment.

Mild dorsal soft tissue swelling overlying the distal metacarpals.
IMPRESSION: No acute osseous abnormalities.

Dorsal soft tissue swelling.

Old healed distal RIGHT fifth metacarpal fracture.

## 2021-07-22 NOTE — ED Triage Notes (Signed)
Redness to RT hand on top of middle finger knuckle that started today after pt put all his weight on that hand to push himself up.

## 2021-07-22 NOTE — ED Provider Notes (Signed)
[facility]    CSN: [id number] Arrival date & time: 07/22/21  1335      History   Chief Complaint Chief Complaint  Patient presents with   Hand Injury    HPI Aaron [name] is a 65 y.o. male.   HPI Patient presents today with right hand pain knuckle of 3rd digit after bearing weight and gripping motion in his bed.  He has redness at the third knuckle joint.  He has not taken any medication for pain. He has applied ice directly to the right hand without relief of pain.  Past Medical History:  Diagnosis Date   Abdominal aortic aneurysm [facility])    sees Dr. Einar Gip for cardiac f/u, [phone]   Arthritis    lower back   Asthma    followed by Dr. Berdie Ogren   Chronic airway obstruction, not elsewhere classified    Dyspnea    normal PFT April 2012   External hemorrhoids without mention of complication    GERD (gastroesophageal reflux disease)    Gout    H/O hiatal hernia    Hemorrhoids    HTN (hypertension)    hx of sees Dr. Graylin Shiver   Hyperlipidemia    Insomnia, unspecified    Other abnormal blood chemistry    Other dyspnea and respiratory abnormality    Other malaise and fatigue    Other specified erythematous condition(695.89)    Other testicular dysfunction    Prostatitis, unspecified    Rash 2011   left chest, biopsy 2012 Dr. Rozann Lesches, results pending   Skin cancer    Tobacco abuse     Patient Active Problem List   Diagnosis Date Noted   Cellulitis and abscess of hand 10/01/2020   Cellulitis, leg 10/01/2020   Dog bite 09/30/2020   Thrombosed external hemorrhoid 99991111   Uncomplicated asthma 99991111   High risk medication use 12/24/2016   Prostate cancer screening 12/24/2016   Diarrhea 09/17/2016   Other emphysema ([place]) 04/15/2015   Erectile dysfunction 12/12/2014   Fatty liver 04/04/2014   Pulmonary nodules 04/04/2014   Chronic back pain 12/06/2013   Gout 12/06/2013   Routine general medical examination at a health care  facility 12/06/2013   GERD (gastroesophageal reflux disease) 12/06/2013   Tongue ulcer 12/06/2013   AAA (abdominal aortic aneurysm) without rupture ([place]) 11/01/2013   Depression 06/29/2013   Musculoskeletal pain 06/07/2013   Lung nodule, multiple 05/26/2013   Chronic cough 03/21/2013   Gouty arthropathy 02/23/2013   Prediabetes 02/23/2013   Need for prophylactic vaccination and inoculation against influenza 09/21/2012   Pre-operative respiratory examination 09/21/2012   Asthma, moderate persistent 03/05/2011   HYPERLIPIDEMIA 01/20/2011   Hx of smoking 01/20/2011   Essential hypertension 01/20/2011    Past Surgical History:  Procedure Laterality Date   ABDOMINAL AORTIC ENDOVASCULAR STENT GRAFT  09/22/2012   Procedure: ABDOMINAL AORTIC ENDOVASCULAR STENT GRAFT;  Surgeon: Mal Misty, MD;  Location: [facility] OR;  Service: Vascular;  Laterality: N/A;  GORE; ultrasound guided.   APPENDECTOMY     EVALUATION UNDER ANESTHESIA WITH HEMORRHOIDECTOMY N/A 12/31/2016   Procedure: EXAM UNDER ANESTHESIA WITH HEMORRHOIDECTOMY;  Surgeon: [name], MD;  Location: [place];  Service: General;  Laterality: N/A;   SKIN CANCER EXCISION     TOE SURGERY  01/2012   joint of left great toe   TONSILLECTOMY     TONSILLECTOMY     TYMPANOPLASTY     WRIST SURGERY     left  Home Medications    Prior to Admission medications   Medication Sig Start Date End Date Taking? Authorizing Provider  acetaminophen (TYLENOL) 500 MG tablet Take 1,000 mg by mouth every 6 (six) hours as needed.    [provider]  albuterol (PROVENTIL HFA;VENTOLIN HFA) 108 (90 Base) MCG/ACT inhaler Inhale 2 puffs into the lungs every 6 (six) hours as needed. For cough and wheezing. 08/30/18   [name], NP  ALPRAZolam Duanne Moron) 0.25 MG tablet Take 1 tablet (0.25 mg total) by mouth daily as needed for anxiety (to last 30 days). 06/12/21 12/09/21  Ngetich, Dinah C, NP  Ascorbic Acid (VITAMIN C) 1000  MG tablet Take 1,000 mg by mouth daily.    [provider]  aspirin EC 81 MG tablet Take 81 mg by mouth daily.    [provider]  buPROPion (WELLBUTRIN XL) 150 MG 24 hr tablet TAKE ONE TABLET BY MOUTH ONCE DAILY FOR 3 DAYS, THEN INCREASE TO ONE BY MOUTH TWICE DAILY. 06/28/21 06/28/22  [name], NP  cholecalciferol (VITAMIN D3) 25 MCG (1000 UNIT) tablet Take 2,000 Units by mouth daily.    [provider]  diclofenac (CATAFLAM) 50 MG tablet Take 1 tablet by mouth 2 times every day as needed 05/13/21     diclofenac (CATAFLAM) 50 MG tablet Take 1 tablet by mouth 2 times every day as needed 06/12/21     fluticasone furoate-vilanterol (BREO ELLIPTA) 200-25 MCG/INH AEPB INHALE 1 PUFF INTO LUNGS ONCE DAILY. 07/16/21 07/16/22  [name], NP  guaiFENesin (MUCINEX) 600 MG 12 hr tablet Take 1 tablet (600 mg total) by mouth 2 (two) times daily for 14 days. 07/19/21 08/02/21  Ngetich, Dinah C, NP  guaiFENesin-dextromethorphan (ROBITUSSIN DM) 100-10 MG/5ML syrup Take 5 mLs by mouth every 4 (four) hours as needed for cough.    [provider]  hydrochlorothiazide (HYDRODIURIL) 25 MG tablet Take 1 tablet (25 mg total) by mouth daily. 07/16/21   [name], NP  losartan (COZAAR) 100 MG tablet Take 1 tablet (100 mg total) by mouth daily. 04/26/21 07/25/21  [name], NP  nirmatrelvir/ritonavir EUA, renal dosing, (PAXLOVID) 10 x 150 MG & 10 x '100MG'$  TABS Take 2 tablets by mouth 2 (two) times daily for 5 days. (Take nirmatrelvir 150 mg one tablet twice daily for 5 days and ritonavir 100 mg one tablet twice daily for 5 days) Patient GFR is 84 07/19/21 07/24/21  Ngetich, Dinah C, NP  pantoprazole (PROTONIX) 40 MG tablet TAKE 1 TABLET BY MOUTH ONCE A DAY 06/28/21 06/28/22  [name], NP  sildenafil (VIAGRA) 50 MG tablet TAKE 1 TABLET BY MOUTH AS NEEDED FOR ERECTILE DYSFUNCTION 07/11/21 07/11/22  [name], NP  simvastatin (ZOCOR) 20 MG tablet TAKE 1 TABLET BY  MOUTH AT BEDTIME 06/17/21 06/17/22  [name], NP  zinc gluconate 50 MG tablet Take 50 mg by mouth daily.    [provider]    Family History Family History  Problem Relation Age of Onset   Heart attack Father 79   COPD Father    Heart disease Father    Asthma Daughter    Cancer Mother        Breast   Lupus Daughter    Diabetes Other        Grandson    Colon cancer Neg Hx    Pancreatic cancer Neg Hx    Esophageal cancer Neg Hx    Stomach cancer Neg Hx  Rectal cancer Neg Hx     Social History Social History   Tobacco Use   Smoking status: Former    Packs/day: 0.25    Years: 38.00    Pack years: 9.50    Types: Cigarettes    Quit date: 10/31/2020    Years since quitting: 0.7   Smokeless tobacco: Former    Types: Snuff  Vaping Use   Vaping Use: Never used  Substance Use Topics   Alcohol use: Yes    Alcohol/week: 0.0 standard drinks    Comment: occ   Drug use: No     Allergies   Patient has no known allergies.   Review of Systems Review of Systems Pertinent negatives listed in HPI   Physical Exam Triage Vital Signs ED Triage Vitals  Enc Vitals Group     BP 07/22/21 1434 (!) 153/84     Pulse Rate 07/22/21 1434 97     Resp 07/22/21 1434 19     Temp 07/22/21 1434 98.1 F (36.7 C)     Temp Source 07/22/21 1434 Oral     SpO2 07/22/21 1434 97 %     Weight --      Height --      Head Circumference --      Peak Flow --      Pain Score 07/22/21 1435 6     Pain Loc --      Pain Edu? --      Excl. in [place]? --    No data found.  Updated Vital Signs BP (!) 153/84 (BP Location: Right Arm)   Pulse 97   Temp 98.1 F (36.7 C) (Oral)   Resp 19   SpO2 97%   Visual Acuity Right Eye Distance:   Left Eye Distance:   Bilateral Distance:    Right Eye Near:   Left Eye Near:    Bilateral Near:     Physical Exam Constitutional:      Appearance: Normal appearance.  HENT:     Head: Normocephalic.  Eyes:     Extraocular Movements:  Extraocular movements intact.     Pupils: Pupils are equal, round, and reactive to light.  Cardiovascular:     Rate and Rhythm: Normal rate. Rhythm irregular.  Pulmonary:     Effort: Pulmonary effort is normal.     Breath sounds: Normal breath sounds and air entry.  Musculoskeletal:        General: Normal range of motion.  Skin:    General: Skin is warm.     Capillary Refill: Capillary refill takes less than 2 seconds.  Neurological:     General: No focal deficit present.     Mental Status: He is alert.  Psychiatric:        Mood and Affect: Mood normal.        Behavior: Behavior normal.        Thought Content: Thought content normal.        Judgment: Judgment normal.     UC Treatments / Results  Labs (all labs ordered are listed, but only abnormal results are displayed) Labs Reviewed - No data to display  EKG   Radiology DG Hand Complete Right  Result Date: 07/22/2021 CLINICAL DATA:  Redness at top of RIGHT hand after putting all his weight on that hand to push himself up, pain EXAM: RIGHT HAND - COMPLETE 3+ VIEW COMPARISON:  None FINDINGS: Osseous mineralization low normal. Joint spaces preserved. Old healed fracture distal fifth metacarpal. No  acute fracture, dislocation, or bone destruction. Fingers superimposed on lateral view limiting assessment. Mild dorsal soft tissue swelling overlying the distal metacarpals. IMPRESSION: No acute osseous abnormalities. Dorsal soft tissue swelling. Old healed distal RIGHT fifth metacarpal fracture. Electronically Signed   By: Lavonia Dana M.D.   On: 07/22/2021 15:07    Procedures Procedures (including critical care time)  Medications Ordered in UC Medications - No data to display  Initial Impression / Assessment and Plan / UC Course  I have reviewed the triage vital signs and the nursing notes.  Pertinent labs & imaging results that were available during my care of the patient were reviewed by me and considered in my medical  decision making (see chart for details).    Hand injury however plain films reveals no acute fracture only a healed fracture involving the right fifth metacarpal region which is not the site in which the patient is experiencing any pain.  Recommend conservative measures with applications of ice, elevation, and continue ibuprofen or naproxen at home.  Follow-up with orthopedic specially if symptoms worsen or do not improve Final Clinical Impressions(s) / UC Diagnoses   Final diagnoses:  Hand injury, right, initial encounter   Discharge Instructions   None    ED Prescriptions   None    PDMP not reviewed this encounter.   Scot Jun, [place] 07/23/21 1341

## 2021-08-01 ENCOUNTER — Other Ambulatory Visit (HOSPITAL_COMMUNITY): Payer: Self-pay

## 2021-08-05 ENCOUNTER — Other Ambulatory Visit (HOSPITAL_COMMUNITY): Payer: Self-pay

## 2021-08-07 ENCOUNTER — Encounter: Payer: Self-pay | Admitting: Family

## 2021-08-07 ENCOUNTER — Telehealth (INDEPENDENT_AMBULATORY_CARE_PROVIDER_SITE_OTHER): Payer: 59 | Admitting: Family

## 2021-08-07 ENCOUNTER — Other Ambulatory Visit: Payer: Self-pay

## 2021-08-07 DIAGNOSIS — R197 Diarrhea, unspecified: Secondary | ICD-10-CM | POA: Diagnosis not present

## 2021-08-07 NOTE — Patient Instructions (Addendum)
-   Please collect stool sample for culture may drop specimen to [facility]  in [facility] in [place].  - Notify provider if symptoms worsen or fail to improve   Bland Diet A bland diet consists of foods that are often soft and do not have a lot of fat, fiber, or extra seasonings. Foods without fat, fiber, or seasoning are easier for the body to digest. They are also less likely to irritate your mouth, throat, stomach, and other parts of your digestive system. A bland diet is sometimes called a BRAT diet. What is my plan? Your health care provider or food and nutrition specialist (dietitian) may recommend specific changes to your diet to prevent symptoms or to treat your symptoms. These changes may include: Eating small meals often. Cooking food until it is soft enough to chew easily. Chewing your food well. Drinking fluids slowly. Not eating foods that are very spicy, sour, or fatty. Not eating citrus fruits, such as oranges and grapefruit. What do I need to know about this diet? Eat a variety of foods from the bland diet food list. Do not follow a bland diet longer than needed. Ask your health care provider whether you should take vitamins or supplements. What foods can I eat? Grains Hot cereals, such as cream of wheat. Rice. Bread, crackers, or tortillas made from refined white flour. Vegetables Canned or cooked vegetables. Mashed or boiled potatoes. Fruits Bananas. Applesauce. Other types of cooked or canned fruit with the skin and seeds removed, such as canned peaches or pears. Meats and other proteins Scrambled eggs. Creamy peanut butter or other nut butters. Lean, well-cooked meats, such as chicken or fish. Tofu. Soups or broths. Dairy Low-fat dairy products, such as milk, cottage cheese, or yogurt. Beverages Water. Herbal tea. Apple juice. Fats and oils Mild salad dressings. Canola or olive oil. Sweets and desserts Pudding. Custard. Fruit gelatin. Ice  cream. The items listed above may not be a complete list of recommended foods and beverages. Contact a dietitian for more options. What foods are not recommended? Grains Whole grain breads and cereals. Vegetables Raw vegetables. Fruits Raw fruits, especially citrus, berries, or dried fruits. Dairy Whole fat dairy foods. Beverages Caffeinated drinks. Alcohol. Seasonings and condiments Strongly flavored seasonings or condiments. Hot sauce. Salsa. Other foods Spicy foods. Fried foods. Sour foods, such as pickled or fermented foods. Foods with high sugar content. Foods high in fiber. The items listed above may not be a complete list of foods and beverages to avoid. Contact a dietitian for more information. Summary A bland diet consists of foods that are often soft and do not have a lot of fat, fiber, or extra seasonings. Foods without fat, fiber, or seasoning are easier for the body to digest. Check with your health care provider to see how long you should follow this diet plan. It is not meant to be followed for long periods. This information is not intended to replace advice given to you by your health care provider. Make sure you discuss any questions you have with your health care provider. Document Revised: 12/09/2017 Document Reviewed: 12/09/2017 Elsevier Patient Education  2022 [employer].

## 2021-08-07 NOTE — Progress Notes (Signed)
This service is provided via telemedicine  No vital signs collected/recorded due to the encounter was a telemedicine visit.   Location of patient (ex: home, work):  Home  Patient consents to a telephone visit: Yes, see telephone visit dated 07/19/2021  Location of the provider (ex: office, home):  [facility], Office   Name of any referring provider:  N/A  Names of all persons participating in the telemedicine service and their role in the encounter:  S.Chrae B/CMA, Marlowe Sax, NP, and Patient   Time spent on call:  6 min with medical assistant    Provider: Shaterica Mcclatchy FNP-C  [name], NP  [name], NP as PCP - General (Nurse Practitioner) [name], [name], NP (Inactive) as Nurse Practitioner (Vascular Surgery) [name], MD as Consulting Physician (General Surgery) Brand Males, MD as Consulting Physician (Pulmonary Disease) [name], MD as Consulting Physician (Cardiology) Starlyn Skeans as Physician Assistant (Dermatology)  Extended Emergency Contact Information Primary Emergency Contact: Krauter,[name] Address: [address]          [place], [place] [address] Johnnette Litter of [place] Phone: [phone] Mobile Phone: [phone] Relation: Spouse  Code Status:  Full code  Goals of care: Advanced Directive information Advanced Directives 04/29/2021  Does Patient Have a Medical Advance Directive? No  Would patient like information on creating a medical advance directive? No - Patient declined  Pre-existing out of facility DNR order (yellow form or pink MOST form) -     Chief Complaint  Patient presents with   Acute Visit    Patient c/o diarrhea. Patient did an at home covid test yesterday and it was negative.     HPI:  Pt is a 65 y.o. male seen today for an acute visit for evaluation of diarrhea x 4 days.States had whole day on Sunday then none on Monday.Tuesday  diarrhea restarted.He took imodium and Pepto bismol which has helped.Thinks getting better No diarrhea today.Had stomach cramping yesterday but not today.denies any blood/mucus in the stool.Had a toast and peanut butter this morning and has been drinking Gatorade and oral IV fluids.   Also denies any fever,chills,N/V or symptoms of COVID-19.Had COVID-19 positive 07/19/2021 was prescribed Paxlovid but states did not take due to side effects concerns. He did a COVID-19 test yesterday which was negative.    Past Medical History:  Diagnosis Date   Abdominal aortic aneurysm [facility])    sees Dr. Einar Gip for cardiac f/u, [phone]   Arthritis    lower back   Asthma    followed by Dr. Berdie Ogren   Chronic airway obstruction, not elsewhere classified    Dyspnea    normal PFT April 2012   External hemorrhoids without mention of complication    GERD (gastroesophageal reflux disease)    Gout    H/O hiatal hernia    Hemorrhoids    HTN (hypertension)    hx of sees Dr. Graylin Shiver   Hyperlipidemia    Insomnia, unspecified    Other abnormal blood chemistry    Other dyspnea and respiratory abnormality    Other malaise and fatigue    Other specified erythematous condition(695.89)    Other testicular dysfunction    Prostatitis, unspecified    Rash 2011   left chest, biopsy 2012 Dr. Rozann Lesches, results pending   Skin cancer    Tobacco abuse    Past Surgical History:  Procedure Laterality Date   ABDOMINAL AORTIC ENDOVASCULAR STENT GRAFT  09/22/2012   Procedure: ABDOMINAL AORTIC ENDOVASCULAR STENT GRAFT;  Surgeon: Mal Misty, MD;  Location: [place];  Service: Vascular;  Laterality: N/A;  GORE; ultrasound guided.   APPENDECTOMY     EVALUATION UNDER ANESTHESIA WITH HEMORRHOIDECTOMY N/A 12/31/2016   Procedure: EXAM UNDER ANESTHESIA WITH HEMORRHOIDECTOMY;  Surgeon: [name], MD;  Location: [place];  Service: General;  Laterality: N/A;   SKIN CANCER EXCISION     TOE SURGERY   01/2012   joint of left great toe   TONSILLECTOMY     TONSILLECTOMY     TYMPANOPLASTY     WRIST SURGERY     left    No Known Allergies  Outpatient Encounter Medications as of 08/07/2021  Medication Sig   acetaminophen (TYLENOL) 500 MG tablet Take 1,000 mg by mouth every 6 (six) hours as needed.   albuterol (PROVENTIL HFA;VENTOLIN HFA) 108 (90 Base) MCG/ACT inhaler Inhale 2 puffs into the lungs every 6 (six) hours as needed. For cough and wheezing.   ALPRAZolam (XANAX) 0.25 MG tablet Take 1 tablet (0.25 mg total) by mouth daily as needed for anxiety (to last 30 days).   Ascorbic Acid (VITAMIN C) 1000 MG tablet Take 1,000 mg by mouth daily.   aspirin EC 81 MG tablet Take 81 mg by mouth daily.   buPROPion (WELLBUTRIN XL) 150 MG 24 hr tablet TAKE ONE TABLET BY MOUTH ONCE DAILY FOR 3 DAYS, THEN INCREASE TO ONE BY MOUTH TWICE DAILY.   cholecalciferol (VITAMIN D3) 25 MCG (1000 UNIT) tablet Take 2,000 Units by mouth daily.   diclofenac (CATAFLAM) 50 MG tablet Take 1 tablet by mouth 2 times every day as needed   diclofenac (CATAFLAM) 50 MG tablet Take 1 tablet by mouth 2 times every day as needed   fluticasone furoate-vilanterol (BREO ELLIPTA) 200-25 MCG/INH AEPB INHALE 1 PUFF INTO LUNGS ONCE DAILY.   guaiFENesin-dextromethorphan (ROBITUSSIN DM) 100-10 MG/5ML syrup Take 5 mLs by mouth every 4 (four) hours as needed for cough.   hydrochlorothiazide (HYDRODIURIL) 25 MG tablet Take 1 tablet (25 mg total) by mouth daily.   losartan (COZAAR) 100 MG tablet Take 1 tablet (100 mg total) by mouth daily.   pantoprazole (PROTONIX) 40 MG tablet TAKE 1 TABLET BY MOUTH ONCE A DAY   sildenafil (VIAGRA) 50 MG tablet TAKE 1 TABLET BY MOUTH AS NEEDED FOR ERECTILE DYSFUNCTION   simvastatin (ZOCOR) 20 MG tablet TAKE 1 TABLET BY MOUTH AT BEDTIME   zinc gluconate 50 MG tablet Take 50 mg by mouth daily.   No facility-administered encounter medications on file as of 08/07/2021.    Review of Systems  Constitutional:   Negative for appetite change, chills, fatigue, fever and unexpected weight change.  HENT:  Negative for congestion, dental problem, ear discharge, ear pain, facial swelling, hearing loss, nosebleeds, postnasal drip, rhinorrhea, sinus pressure, sinus pain, sneezing, sore throat, tinnitus and trouble swallowing.   Eyes:  Negative for pain, discharge, redness, itching and visual disturbance.  Respiratory:  Negative for cough, chest tightness, shortness of breath and wheezing.   Cardiovascular:  Negative for chest pain, palpitations and leg swelling.  Gastrointestinal:  Positive for diarrhea. Negative for abdominal distention, abdominal pain, blood in stool, constipation, nausea and vomiting.  Musculoskeletal:  Negative for gait problem, joint swelling and myalgias.  Skin:  Negative for color change, pallor, rash and wound.  Neurological:  Negative for dizziness, syncope, light-headedness, numbness and headaches.  Psychiatric/Behavioral:  Negative for agitation, behavioral problems, confusion, hallucinations, self-injury, sleep disturbance  and suicidal ideas. The patient is not nervous/anxious.    Immunization History  Administered Date(s) Administered   Influenza Split 09/20/2012   Influenza Whole 08/30/2019   Influenza,inj,Quad PF,6+ Mos 09/29/2013, 08/08/2014, 08/09/2015, 07/17/2016, 10/14/2017, 01/14/2019, 09/21/2020   Influenza-Unspecified 08/24/2017   Janssen (J&J) SARS-COV-2 Vaccination 08/04/2020   Pneumococcal Conjugate-13 12/12/2014, 12/12/2014   Pneumococcal Polysaccharide-23 06/30/2011   Tdap 12/06/2013   Zoster Recombinat (Shingrix) 06/13/2020, 09/07/2020   Pertinent  Health Maintenance Due  Topic Date Due   INFLUENZA VACCINE  06/24/2021   COLONOSCOPY (Pts 45-88yr Insurance coverage will need to be confirmed)  10/03/2026   Fall Risk  07/19/2021 04/29/2021 04/08/2021 03/15/2021 03/05/2021  Falls in the past year? 0 0 '1 1 1  '$ Number falls in past yr: 0 0 0 0 0  Injury with Fall? - 0 0  0 0  Follow up Falls evaluation completed - - - -   Functional Status Survey:    There were no vitals filed for this visit. There is no height or weight on file to calculate BMI. Physical Exam Constitutional:      General: He is not in acute distress.    Appearance: He is not ill-appearing.  Pulmonary:     Effort: Pulmonary effort is normal. No respiratory distress.  Neurological:     Mental Status: He is alert and oriented to person, place, and time.  Psychiatric:        Mood and Affect: Mood normal.        Behavior: Behavior normal.        Thought Content: Thought content normal.        Judgment: Judgment normal.    Labs reviewed: Recent Labs    10/01/20 0612 10/02/20 0534 03/11/21 0936  NA 138 139 140  K 3.6 4.2 4.4  CL 102 104 105  CO2 '28 23 27  '$ GLUCOSE 94 93 111*  BUN 7* 9 13  CREATININE 0.79 0.87 0.96  CALCIUM 8.6* 8.7* 9.2   Recent Labs    09/30/20 1406 10/01/20 0612 03/11/21 0936  AST 36 27 27  ALT 37 31 32  ALKPHOS 68 61  --   BILITOT 0.8 0.8 0.4  PROT 7.3 6.6 6.6  ALBUMIN 4.0 3.6  --    Recent Labs    09/18/20 0805 09/30/20 1406 10/01/20 0612 10/02/20 0534 03/11/21 0936  WBC 6.5 8.8 7.2 7.0 7.8  NEUTROABS 3,920 5.7  --   --  4,789  HGB 15.6 14.7 14.0 14.6 16.2  HCT 45.1 43.1 42.6 44.1 48.9  MCV 94.0 93.7 96.4 95.7 91.1  PLT 285 244 241 261 295   Lab Results  Component Value Date   TSH 2.24 12/24/2016   Lab Results  Component Value Date   HGBA1C 5.4 09/18/2020   Lab Results  Component Value Date   CHOL 160 03/11/2021   HDL 52 03/11/2021   LDLCALC 81 03/11/2021   TRIG 170 (H) 03/11/2021   CHOLHDL 3.1 03/11/2021    Significant Diagnostic Results in last 30 days:  DG Hand Complete Right  Result Date: 07/22/2021 CLINICAL DATA:  Redness at top of RIGHT hand after putting all his weight on that hand to push himself up, pain EXAM: RIGHT HAND - COMPLETE 3+ VIEW COMPARISON:  None FINDINGS: Osseous mineralization low normal. Joint  spaces preserved. Old healed fracture distal fifth metacarpal. No acute fracture, dislocation, or bone destruction. Fingers superimposed on lateral view limiting assessment. Mild dorsal soft tissue swelling overlying the distal metacarpals. IMPRESSION: No  acute osseous abnormalities. Dorsal soft tissue swelling. Old healed distal RIGHT fifth metacarpal fracture. Electronically Signed   By: Lavonia Dana M.D.   On: 07/22/2021 15:07    Assessment/Plan   Diarrhea, unspecified type Unclear etiology.Reports no fever or chills.No diarrhea today feels like symptoms have improved. - continue with Hydration with Pedialyte or Gatorade  - continue on a bland diet and advance as tolerated  - would like stool specimen to be collected at [facility] lab in [place] provider if symptoms worsen or fail to improve  - Aerobic Culture; Future  Family/ staff Communication: Reviewed plan of care with patient verbalized understanding.   Labs/tests ordered: None   Next Appointment: As needed if symptoms worsen or fail to improve   I connected with  Chesley Mires on 08/07/21 by a video enabled telemedicine application and verified that I am speaking with the correct person using two identifiers.   I discussed the limitations of evaluation and management by telemedicine. The patient expressed understanding and agreed to proceed.  Spent 12 minutes of face to face video with patient    Sandrea Hughs, NP

## 2021-08-08 ENCOUNTER — Telehealth: Payer: 59 | Admitting: Nurse Practitioner

## 2021-08-12 ENCOUNTER — Other Ambulatory Visit (HOSPITAL_COMMUNITY): Payer: Self-pay

## 2021-08-15 ENCOUNTER — Other Ambulatory Visit (HOSPITAL_COMMUNITY): Payer: Self-pay

## 2021-08-15 ENCOUNTER — Other Ambulatory Visit: Payer: Self-pay | Admitting: Family

## 2021-08-15 DIAGNOSIS — F419 Anxiety disorder, unspecified: Secondary | ICD-10-CM

## 2021-08-15 MED ORDER — ALPRAZOLAM 0.25 MG PO TABS
0.2500 mg | ORAL_TABLET | Freq: Every day | ORAL | 1 refills | Status: DC | PRN
  Filled 2021-08-15: qty 20, 30d supply, fill #0
  Filled 2021-09-21: qty 20, 30d supply, fill #1

## 2021-08-15 NOTE — Telephone Encounter (Signed)
Patient has request refill on medication "Xanax". Patient last refill was 06/12/2021. Patient medication pend and sent to PCP Dewaine Oats Carlos American, NP for approval. Please Advise.

## 2021-08-19 ENCOUNTER — Other Ambulatory Visit (HOSPITAL_COMMUNITY): Payer: Self-pay

## 2021-08-20 ENCOUNTER — Ambulatory Visit: Payer: 59 | Admitting: Orthopedic Surgery

## 2021-08-26 ENCOUNTER — Ambulatory Visit: Payer: Self-pay

## 2021-08-26 ENCOUNTER — Other Ambulatory Visit: Payer: Self-pay

## 2021-08-26 ENCOUNTER — Encounter: Payer: Self-pay | Admitting: Orthopedic Surgery

## 2021-08-26 ENCOUNTER — Ambulatory Visit (INDEPENDENT_AMBULATORY_CARE_PROVIDER_SITE_OTHER): Payer: 59 | Admitting: Orthopedic Surgery

## 2021-08-26 DIAGNOSIS — M5442 Lumbago with sciatica, left side: Secondary | ICD-10-CM | POA: Diagnosis not present

## 2021-08-26 DIAGNOSIS — M2021 Hallux rigidus, right foot: Secondary | ICD-10-CM | POA: Diagnosis not present

## 2021-08-26 DIAGNOSIS — M25572 Pain in left ankle and joints of left foot: Secondary | ICD-10-CM

## 2021-08-26 DIAGNOSIS — M79671 Pain in right foot: Secondary | ICD-10-CM | POA: Diagnosis not present

## 2021-08-26 DIAGNOSIS — G8929 Other chronic pain: Secondary | ICD-10-CM | POA: Diagnosis not present

## 2021-08-26 DIAGNOSIS — M1A0410 Idiopathic chronic gout, right hand, without tophus (tophi): Secondary | ICD-10-CM

## 2021-08-26 DIAGNOSIS — M25571 Pain in right ankle and joints of right foot: Secondary | ICD-10-CM

## 2021-08-26 NOTE — Progress Notes (Signed)
Office Visit Note   Patient: Aaron [name]           Date of Birth: 1955/12/09           MRN: [id number] Visit Date: 08/26/2021              Requested by: [name], NP [place],  [place] [id number] PCP: [name], NP  Chief Complaint  Patient presents with   Right Foot - Pain   Right Hand - Pain      HPI: Patient is a 65 year old gentleman who presents complaining of swelling and stiffness right hand MCP joint of the long finger.  Patient states he had an acute onset of swelling and pain around Labor Day.  Patient denies any previous traumas to the right hand.  Patient also presents complaining of increasing pain in the right foot great toe MTP joint.  Patient states he had bunion surgery on the left foot years ago and thinks that it is the same thing.  Patient states he has had a history of gout in the past.  Assessment & Plan: Visit Diagnoses:  1. Pain in right foot   2. Chronic pain of both ankles   3. Chronic left-sided low back pain with left-sided sciatica   4. 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 Hallux rigidus, right foot     Plan: Recommended stiff soled sneakers to unload the great toe MTP joint.  Discussed that if conservative measures do not work we could proceed with fusion of the great toe MTP joint.  With the acute swelling of the long finger MCP joint right hand we will draw a uric acid and call him with the results.  Patient may need gout medication.  Follow-Up Instructions: Return if symptoms worsen or fail to improve.   Ortho Exam  Patient is alert, oriented, no adenopathy, well-dressed, normal affect, normal respiratory effort. Examination patient has swelling around the right long finger MCP joint there is swelling noted no cellulitis no signs of infection patient has an old boxer's fracture of the fifth metacarpal patient denies any history of trauma.  Examination the right foot he has good  pulses he has significant hallux rigidus the great toe with dorsiflexion of 10 degrees plantarflexion of about 20 degrees there is pain to palpation of the large dorsal spur at the great toe MTP joint.  Imaging: XR Foot 2 Views Right  Result Date: 08/26/2021 2 view radiographs of the right foot shows a large dorsal osteophytic bone spur at the great toe MTP joint with subchondral cyst subchondral sclerosis and joint space narrowing of the right great toe MTP joint.  No images are attached to the encounter.  Labs: Lab Results  Component Value Date   HGBA1C 5.4 09/18/2020   HGBA1C 6.0 (H) 08/09/2015   HGBA1C 5.9 (H) 12/12/2014   ESRSEDRATE 13 10/01/2020   ESRSEDRATE 11 06/13/2020   CRP 8.5 (H) 10/01/2020   LABURIC 6.3 04/04/2014   LABURIC 6.5 12/02/2013   LABURIC 7.3 02/16/2013   REPTSTATUS 10/06/2020 FINAL 09/30/2020   CULT  09/30/2020    NO GROWTH 6 DAYS Performed at [facility], [address], [place], [place] [id number]      Lab Results  Component Value Date   ALBUMIN 3.6 10/01/2020   ALBUMIN 4.0 09/30/2020   ALBUMIN 4.2 08/12/2019    Lab Results  Component Value Date   MG 1.7 09/22/2012   No results  found for: VD25OH  No results found for: PREALBUMIN CBC EXTENDED Latest Ref Rng & Units 03/11/2021 10/02/2020 10/01/2020  WBC 3.8 - 10.8 Thousand/uL 7.8 7.0 7.2  RBC 4.20 - 5.80 Million/uL 5.37 4.61 4.42  HGB 13.2 - 17.1 g/dL 16.2 14.6 14.0  HCT 38.5 - 50.0 % 48.9 44.1 42.6  PLT 140 - 400 Thousand/uL 295 261 241  NEUTROABS 1,500 - 7,800 cells/uL 4,789 - -  LYMPHSABS 850 - 3,900 cells/uL 1,903 - -     There is no height or weight on file to calculate BMI.  Orders:  Orders Placed This Encounter  Procedures   XR Foot 2 Views Right   Uric acid   No orders of the defined types were placed in this encounter.    Procedures: No procedures performed  Clinical Data: No additional findings.  ROS:  All other systems negative, except as noted in the HPI. Review  of Systems  Objective: Vital Signs: There were no vitals taken for this visit.  Specialty Comments:  No specialty comments available.  PMFS History: Patient Active Problem List   Diagnosis Date Noted   Cellulitis and abscess of hand 10/01/2020   Cellulitis, leg 10/01/2020   Dog bite 09/30/2020   Thrombosed external hemorrhoid 90/30/0923   Uncomplicated asthma 30/05/6225   High risk medication use 12/24/2016   Prostate cancer screening 12/24/2016   Diarrhea 09/17/2016   Other emphysema ([place]) 04/15/2015   Erectile dysfunction 12/12/2014   Fatty liver 04/04/2014   Pulmonary nodules 04/04/2014   Chronic back pain 12/06/2013   Gout 12/06/2013   Routine general medical examination at a health care facility 12/06/2013   GERD (gastroesophageal reflux disease) 12/06/2013   Tongue ulcer 12/06/2013   AAA (abdominal aortic aneurysm) without rupture 11/01/2013   Depression 06/29/2013   Musculoskeletal pain 06/07/2013   Lung nodule, multiple 05/26/2013   Chronic cough 03/21/2013   Gouty arthropathy 02/23/2013   Prediabetes 02/23/2013   Need for prophylactic vaccination and inoculation against influenza 09/21/2012   Pre-operative respiratory examination 09/21/2012   Asthma, moderate persistent 03/05/2011   HYPERLIPIDEMIA 01/20/2011   Hx of smoking 01/20/2011   Essential hypertension 01/20/2011   Past Medical History:  Diagnosis Date   Abdominal aortic aneurysm    sees Dr. Einar Gip for cardiac f/u, [phone]   Arthritis    lower back   Asthma    followed by Dr. Berdie Ogren   Chronic airway obstruction, not elsewhere classified    Dyspnea    normal PFT April 2012   External hemorrhoids without mention of complication    GERD (gastroesophageal reflux disease)    Gout    H/O hiatal hernia    Hemorrhoids    HTN (hypertension)    hx of sees Dr. Graylin Shiver   Hyperlipidemia    Insomnia, unspecified    Other abnormal blood chemistry    Other dyspnea and respiratory abnormality     Other malaise and fatigue    Other specified erythematous condition(695.89)    Other testicular dysfunction    Prostatitis, unspecified    Rash 2011   left chest, biopsy 2012 Dr. Rozann Lesches, results pending   Skin cancer    Tobacco abuse     Family History  Problem Relation Age of Onset   Heart attack Father 76   COPD Father    Heart disease Father    Asthma Daughter    Cancer Mother        Breast   Lupus Daughter    Diabetes Other  Grandson    Colon cancer Neg Hx    Pancreatic cancer Neg Hx    Esophageal cancer Neg Hx    Stomach cancer Neg Hx    Rectal cancer Neg Hx     Past Surgical History:  Procedure Laterality Date   ABDOMINAL AORTIC ENDOVASCULAR STENT GRAFT  09/22/2012   Procedure: ABDOMINAL AORTIC ENDOVASCULAR STENT GRAFT;  Surgeon: Mal Misty, MD;  Location: [place];  Service: Vascular;  Laterality: N/A;  GORE; ultrasound guided.   APPENDECTOMY     EVALUATION UNDER ANESTHESIA WITH HEMORRHOIDECTOMY N/A 12/31/2016   Procedure: EXAM UNDER ANESTHESIA WITH HEMORRHOIDECTOMY;  Surgeon: [name], MD;  Location: [place];  Service: General;  Laterality: N/A;   SKIN CANCER EXCISION     TOE SURGERY  01/2012   joint of left great toe   TONSILLECTOMY     TONSILLECTOMY     TYMPANOPLASTY     WRIST SURGERY     left   Social History   Occupational History   Occupation: CNA    Employer: [employer]  Tobacco Use   Smoking status: Former    Packs/day: 0.25    Years: 38.00    Pack years: 9.50    Types: Cigarettes    Quit date: 10/31/2020    Years since quitting: 0.8   Smokeless tobacco: Former    Types: Snuff  Vaping Use   Vaping Use: Never used  Substance and Sexual Activity   Alcohol use: Yes    Alcohol/week: 0.0 standard drinks    Comment: occ   Drug use: No   Sexual activity: Not on file

## 2021-08-27 ENCOUNTER — Other Ambulatory Visit (HOSPITAL_COMMUNITY): Payer: Self-pay

## 2021-08-27 ENCOUNTER — Other Ambulatory Visit: Payer: Self-pay | Admitting: Orthopedic Surgery

## 2021-08-27 ENCOUNTER — Telehealth: Payer: Self-pay

## 2021-08-27 LAB — URIC ACID: Uric Acid, Serum: 10.4 mg/dL — ABNORMAL HIGH (ref 4.0–8.0)

## 2021-08-27 MED ORDER — ALLOPURINOL 100 MG PO TABS
100.0000 mg | ORAL_TABLET | Freq: Two times a day (BID) | ORAL | 3 refills | Status: DC
  Filled 2021-08-27: qty 60, 30d supply, fill #0
  Filled 2021-10-28: qty 60, 30d supply, fill #1

## 2021-08-27 MED ORDER — COLCHICINE 0.6 MG PO CAPS
0.6000 mg | ORAL_CAPSULE | Freq: Two times a day (BID) | ORAL | 1 refills | Status: DC | PRN
  Filled 2021-08-27: qty 60, 30d supply, fill #0

## 2021-08-27 NOTE — Telephone Encounter (Signed)
Called [pt to advise of uric acid results and to advise of medications called into pharm. Appt made for recheck of labs and mailed infor on foods to avoid and foods to eat to help gout. Pt and his wife voiced understanding and will call with any questions.

## 2021-08-28 ENCOUNTER — Other Ambulatory Visit (HOSPITAL_COMMUNITY): Payer: Self-pay

## 2021-09-03 ENCOUNTER — Other Ambulatory Visit (HOSPITAL_COMMUNITY): Payer: Self-pay

## 2021-09-10 ENCOUNTER — Other Ambulatory Visit (HOSPITAL_COMMUNITY): Payer: Self-pay

## 2021-09-10 ENCOUNTER — Other Ambulatory Visit: Payer: Self-pay | Admitting: Nurse Practitioner

## 2021-09-10 DIAGNOSIS — I10 Essential (primary) hypertension: Secondary | ICD-10-CM

## 2021-09-10 MED ORDER — HYDROCHLOROTHIAZIDE 25 MG PO TABS
25.0000 mg | ORAL_TABLET | Freq: Every day | ORAL | 3 refills | Status: DC
  Filled 2021-09-10: qty 90, 90d supply, fill #0

## 2021-09-11 ENCOUNTER — Other Ambulatory Visit (HOSPITAL_COMMUNITY): Payer: Self-pay

## 2021-09-12 ENCOUNTER — Other Ambulatory Visit (HOSPITAL_COMMUNITY): Payer: Self-pay

## 2021-09-12 MED ORDER — FLUTICASONE FUROATE-VILANTEROL 200-25 MCG/ACT IN AEPB
INHALATION_SPRAY | RESPIRATORY_TRACT | 1 refills | Status: DC
  Filled 2021-09-12: qty 60, 30d supply, fill #0
  Filled 2021-10-21: qty 60, 30d supply, fill #1

## 2021-09-13 DIAGNOSIS — M9901 Segmental and somatic dysfunction of cervical region: Secondary | ICD-10-CM | POA: Diagnosis not present

## 2021-09-13 DIAGNOSIS — M9902 Segmental and somatic dysfunction of thoracic region: Secondary | ICD-10-CM | POA: Diagnosis not present

## 2021-09-13 DIAGNOSIS — M546 Pain in thoracic spine: Secondary | ICD-10-CM | POA: Diagnosis not present

## 2021-09-13 DIAGNOSIS — M9903 Segmental and somatic dysfunction of lumbar region: Secondary | ICD-10-CM | POA: Diagnosis not present

## 2021-09-13 DIAGNOSIS — M542 Cervicalgia: Secondary | ICD-10-CM | POA: Diagnosis not present

## 2021-09-21 ENCOUNTER — Other Ambulatory Visit (HOSPITAL_COMMUNITY): Payer: Self-pay

## 2021-09-24 ENCOUNTER — Other Ambulatory Visit (HOSPITAL_COMMUNITY): Payer: Self-pay

## 2021-09-26 ENCOUNTER — Other Ambulatory Visit: Payer: Self-pay | Admitting: Nurse Practitioner

## 2021-09-26 ENCOUNTER — Telehealth: Payer: Self-pay | Admitting: Orthopedic Surgery

## 2021-09-26 DIAGNOSIS — K219 Gastro-esophageal reflux disease without esophagitis: Secondary | ICD-10-CM

## 2021-09-26 NOTE — Telephone Encounter (Signed)
I spoke with pt. Advised okay to take colchicine and allopurinol together. Allopurinol is a maintenance Rx for gout and colchicine is used for onset symptoms for gout. He still has a flare up and has been on colchicine for a few weeks. I told him to continue to take it and go ahead and start allopurinol as he has not yet.

## 2021-09-26 NOTE — Telephone Encounter (Signed)
Pt called stating he's not sure if he's been taking two of his rxs correctly. He states he takes the colchicine twice a day but he isn't sure if he supposed to take he zyloprim first. He would like a CB to be advised please.  [phone]

## 2021-09-27 ENCOUNTER — Other Ambulatory Visit (HOSPITAL_COMMUNITY): Payer: Self-pay

## 2021-09-27 ENCOUNTER — Ambulatory Visit: Payer: 59 | Admitting: Family

## 2021-09-27 MED ORDER — PANTOPRAZOLE SODIUM 40 MG PO TBEC
DELAYED_RELEASE_TABLET | Freq: Every day | ORAL | 3 refills | Status: DC
  Filled 2021-09-27: qty 90, fill #0
  Filled 2021-12-24: qty 90, 90d supply, fill #0
  Filled 2022-03-26: qty 90, 90d supply, fill #1
  Filled 2022-06-19: qty 90, 90d supply, fill #2
  Filled 2022-09-17: qty 90, 90d supply, fill #3

## 2021-09-30 ENCOUNTER — Ambulatory Visit: Payer: 59 | Admitting: Nurse Practitioner

## 2021-09-30 ENCOUNTER — Other Ambulatory Visit: Payer: Self-pay

## 2021-09-30 ENCOUNTER — Encounter: Payer: Self-pay | Admitting: Nurse Practitioner

## 2021-09-30 ENCOUNTER — Ambulatory Visit (INDEPENDENT_AMBULATORY_CARE_PROVIDER_SITE_OTHER): Payer: 59 | Admitting: Orthopedic Surgery

## 2021-09-30 ENCOUNTER — Other Ambulatory Visit (HOSPITAL_COMMUNITY): Payer: Self-pay

## 2021-09-30 ENCOUNTER — Encounter: Payer: Self-pay | Admitting: Orthopedic Surgery

## 2021-09-30 VITALS — BP 130/82 | HR 79 | Temp 97.1°F | Ht 73.0 in | Wt 233.0 lb

## 2021-09-30 DIAGNOSIS — J4540 Moderate persistent asthma, uncomplicated: Secondary | ICD-10-CM

## 2021-09-30 DIAGNOSIS — M1A0410 Idiopathic chronic gout, right hand, without tophus (tophi): Secondary | ICD-10-CM | POA: Diagnosis not present

## 2021-09-30 DIAGNOSIS — Z23 Encounter for immunization: Secondary | ICD-10-CM | POA: Diagnosis not present

## 2021-09-30 DIAGNOSIS — M109 Gout, unspecified: Secondary | ICD-10-CM

## 2021-09-30 DIAGNOSIS — I10 Essential (primary) hypertension: Secondary | ICD-10-CM | POA: Diagnosis not present

## 2021-09-30 DIAGNOSIS — F419 Anxiety disorder, unspecified: Secondary | ICD-10-CM

## 2021-09-30 DIAGNOSIS — M2021 Hallux rigidus, right foot: Secondary | ICD-10-CM

## 2021-09-30 MED ORDER — AMLODIPINE BESYLATE 2.5 MG PO TABS
2.5000 mg | ORAL_TABLET | Freq: Every day | ORAL | 1 refills | Status: DC
Start: 2021-09-30 — End: 2021-11-27
  Filled 2021-09-30: qty 30, 30d supply, fill #0
  Filled 2021-10-28: qty 30, 30d supply, fill #1

## 2021-09-30 NOTE — Progress Notes (Addendum)
Office Visit Note   Patient: Aaron [name]           Date of Birth: 1956-04-19           MRN: [id number] Visit Date: 09/30/2021              Requested by: [name], NP [place],  [place] [address] PCP: [name], NP  Chief Complaint  Patient presents with   Right Foot - Follow-up      HPI: Patient is a 65 year old gentleman who presents in follow-up for gout involving the right hand long finger MCP joint as well as hallux rigidus right great toe.  Patient states that his great toe pain has resolved.  He states that he was not quite sure how to take the gout medicine so he stopped the colchicine as well as the allopurinol.  Patient states he was recently started on hydrochlorothiazide 25 mg for assistance with lowering his blood pressure he states that this was stopped due to his recent acute gout flareup.  Assessment & Plan: Visit Diagnoses:  1. Idiopathic chronic gout of right hand without tophus   2. Hallux rigidus, right foot     Plan: Recommend that he take the allopurinol 100 mg twice a day use the colchicine for any flareups.  Discussed dietary recommendations with increased risks with alcohol shellfish and red meats.  Patient will have an uric acid drawn in 4 weeks with his primary care physician and I will follow-up in 6 weeks.  Follow-Up Instructions: No follow-ups on file.   Ortho Exam  Patient is alert, oriented, no adenopathy, well-dressed, normal affect, normal respiratory effort. Examination patient still has swelling of the right long finger MCP joint but this is less swollen and not tender to palpation.  His right foot is also asymptomatic.  Imaging: No results found. No images are attached to the encounter.  Labs: Lab Results  Component Value Date   HGBA1C 5.4 09/18/2020   HGBA1C 6.0 (H) 08/09/2015   HGBA1C 5.9 (H) 12/12/2014   ESRSEDRATE 13 10/01/2020   ESRSEDRATE 11 06/13/2020   CRP 8.5 (H) 10/01/2020    LABURIC 10.4 (H) 08/26/2021   LABURIC 6.3 04/04/2014   LABURIC 6.5 12/02/2013   REPTSTATUS 10/06/2020 FINAL 09/30/2020   CULT  09/30/2020    NO GROWTH 6 DAYS Performed at [facility], [address], [place], [place] [address]      Lab Results  Component Value Date   ALBUMIN 3.6 10/01/2020   ALBUMIN 4.0 09/30/2020   ALBUMIN 4.2 08/12/2019    Lab Results  Component Value Date   MG 1.7 09/22/2012   No results found for: VD25OH  No results found for: PREALBUMIN CBC EXTENDED Latest Ref Rng & Units 03/11/2021 10/02/2020 10/01/2020  WBC 3.8 - 10.8 Thousand/uL 7.8 7.0 7.2  RBC 4.20 - 5.80 Million/uL 5.37 4.61 4.42  HGB 13.2 - 17.1 g/dL 16.2 14.6 14.0  HCT 38.5 - 50.0 % 48.9 44.1 42.6  PLT 140 - 400 Thousand/uL 295 261 241  NEUTROABS 1,500 - 7,800 cells/uL 4,789 - -  LYMPHSABS 850 - 3,900 cells/uL 1,903 - -     There is no height or weight on file to calculate BMI.  Orders:  No orders of the defined types were placed in this encounter.  No orders of the defined types were placed in this encounter.    Procedures: No procedures performed  Clinical Data: No additional findings.  ROS:  All  other systems negative, except as noted in the HPI. Review of Systems  Objective: Vital Signs: There were no vitals taken for this visit.  Specialty Comments:  No specialty comments available.  PMFS History: Patient Active Problem List   Diagnosis Date Noted   Cellulitis and abscess of hand 10/01/2020   Cellulitis, leg 10/01/2020   Dog bite 09/30/2020   Thrombosed external hemorrhoid 57/84/6962   Uncomplicated asthma 95/28/4132   High risk medication use 12/24/2016   Prostate cancer screening 12/24/2016   Diarrhea 09/17/2016   Other emphysema ([place]) 04/15/2015   Erectile dysfunction 12/12/2014   Fatty liver 04/04/2014   Pulmonary nodules 04/04/2014   Chronic back pain 12/06/2013   Gout 12/06/2013   Routine general medical examination at a health care facility 12/06/2013    GERD (gastroesophageal reflux disease) 12/06/2013   Tongue ulcer 12/06/2013   AAA (abdominal aortic aneurysm) without rupture 11/01/2013   Depression 06/29/2013   Musculoskeletal pain 06/07/2013   Lung nodule, multiple 05/26/2013   Chronic cough 03/21/2013   Gouty arthropathy 02/23/2013   Prediabetes 02/23/2013   Need for prophylactic vaccination and inoculation against influenza 09/21/2012   Pre-operative respiratory examination 09/21/2012   Asthma, moderate persistent 03/05/2011   HYPERLIPIDEMIA 01/20/2011   Hx of smoking 01/20/2011   Essential hypertension 01/20/2011   Past Medical History:  Diagnosis Date   Abdominal aortic aneurysm    sees Dr. Einar Gip for cardiac f/u, [phone]   Arthritis    lower back   Asthma    followed by Dr. Berdie Ogren   Chronic airway obstruction, not elsewhere classified    Dyspnea    normal PFT April 2012   External hemorrhoids without mention of complication    GERD (gastroesophageal reflux disease)    Gout    H/O hiatal hernia    Hemorrhoids    HTN (hypertension)    hx of sees Dr. Graylin Shiver   Hyperlipidemia    Insomnia, unspecified    Other abnormal blood chemistry    Other dyspnea and respiratory abnormality    Other malaise and fatigue    Other specified erythematous condition(695.89)    Other testicular dysfunction    Prostatitis, unspecified    Rash 2011   left chest, biopsy 2012 Dr. Rozann Lesches, results pending   Skin cancer    Tobacco abuse     Family History  Problem Relation Age of Onset   Cancer Mother        Breast   Heart attack Father 73   COPD Father    Heart disease Father    Osler-Weber-Rendu syndrome Brother    Asthma Daughter    Lupus Daughter    Diabetes Other        Grandson    Colon cancer Neg Hx    Pancreatic cancer Neg Hx    Esophageal cancer Neg Hx    Stomach cancer Neg Hx    Rectal cancer Neg Hx     Past Surgical History:  Procedure Laterality Date   ABDOMINAL AORTIC ENDOVASCULAR STENT  GRAFT  09/22/2012   Procedure: ABDOMINAL AORTIC ENDOVASCULAR STENT GRAFT;  Surgeon: Mal Misty, MD;  Location: [facility] OR;  Service: Vascular;  Laterality: N/A;  GORE; ultrasound guided.   APPENDECTOMY     EVALUATION UNDER ANESTHESIA WITH HEMORRHOIDECTOMY N/A 12/31/2016   Procedure: EXAM UNDER ANESTHESIA WITH HEMORRHOIDECTOMY;  Surgeon: [name], MD;  Location: [place];  Service: General;  Laterality: N/A;   SKIN CANCER EXCISION     TOE SURGERY  01/2012   joint of left great toe   TONSILLECTOMY     TONSILLECTOMY     TYMPANOPLASTY     WRIST SURGERY     left   Social History   Occupational History   Occupation: CNA    Employer: [employer]  Tobacco Use   Smoking status: Former    Packs/day: 0.25    Years: 38.00    Pack years: 9.50    Types: Cigarettes    Quit date: 10/31/2020    Years since quitting: 0.9   Smokeless tobacco: Former    Types: Snuff  Vaping Use   Vaping Use: Never used  Substance and Sexual Activity   Alcohol use: Yes    Alcohol/week: 0.0 standard drinks    Comment: occ   Drug use: No   Sexual activity: Not on file

## 2021-09-30 NOTE — Patient Instructions (Signed)
Stop HCTZ  (hydrochlorothiazide) Start norvasc 2.5 mg by mouth daily  Take blood pressure at home AFTER medication and record.

## 2021-09-30 NOTE — Progress Notes (Signed)
Careteam: [name], NP as PCP - General (Nurse Practitioner) [name], [name], NP (Inactive) as Nurse Practitioner (Vascular Surgery) [name], MD as Consulting Physician (General Surgery) Brand Males, MD as Consulting Physician (Pulmonary Disease) [name], MD as Consulting Physician (Cardiology) [name], PA-C as Physician Assistant (Dermatology)  PLACE OF SERVICE:  [place] Directive information Does Patient Have a Medical Advance Directive?: No, Would patient like information on creating a medical advance directive?: Yes (MAU/Ambulatory/Procedural Areas - Information given)  No Known Allergies  Chief Complaint  Patient presents with   Medical Management of Chronic Issues    6 month follow-up. Discuss the need for pneumonia vaccine, covid and flu vaccines or postpone/exclude if patient refuses.      HPI: Patient is a 65 y.o. male for routine follow up.   Anxiety/depression- doing well on Wellbutrin, anxiety has been controlled. Uses xanax PRN.   Hypertension- controlled on   Had covid since the summer- high fever and diarrhea, only lasted 2 days   Having more gout in foot, has been on colchicine and allopurinol but not taking anything now. His last uric acid level was 10.6,goal under 6. He has modified diet to help this.  He thought it was due to bunion/bone spurs but then after blood work found out it was gout.   Review of Systems:  Review of Systems  Constitutional:  Negative for chills, fever and weight loss.  HENT:  Negative for tinnitus.   Respiratory:  Negative for cough, sputum production and shortness of breath.   Cardiovascular:  Negative for chest pain, palpitations and leg swelling.  Gastrointestinal:  Negative for abdominal pain, constipation, diarrhea and heartburn.  Genitourinary:  Negative for dysuria, frequency and urgency.  Musculoskeletal:  Positive for joint pain. Negative for  back pain, falls and myalgias.  Skin: Negative.   Neurological:  Negative for dizziness and headaches.  Psychiatric/Behavioral:  Negative for depression and memory loss. The patient does not have insomnia.    Past Medical History:  Diagnosis Date   Abdominal aortic aneurysm    sees Dr. Einar Gip for cardiac f/u, [phone]   Arthritis    lower back   Asthma    followed by Dr. Berdie Ogren   Chronic airway obstruction, not elsewhere classified    Dyspnea    normal PFT April 2012   External hemorrhoids without mention of complication    GERD (gastroesophageal reflux disease)    Gout    H/O hiatal hernia    Hemorrhoids    HTN (hypertension)    hx of sees Dr. Graylin Shiver   Hyperlipidemia    Insomnia, unspecified    Other abnormal blood chemistry    Other dyspnea and respiratory abnormality    Other malaise and fatigue    Other specified erythematous condition(695.89)    Other testicular dysfunction    Prostatitis, unspecified    Rash 2011   left chest, biopsy 2012 Dr. Rozann Lesches, results pending   Skin cancer    Tobacco abuse    Past Surgical History:  Procedure Laterality Date   ABDOMINAL AORTIC ENDOVASCULAR STENT GRAFT  09/22/2012   Procedure: ABDOMINAL AORTIC ENDOVASCULAR STENT GRAFT;  Surgeon: Mal Misty, MD;  Location: [place];  Service: Vascular;  Laterality: N/A;  GORE; ultrasound guided.   APPENDECTOMY     EVALUATION UNDER ANESTHESIA WITH HEMORRHOIDECTOMY N/A 12/31/2016   Procedure: EXAM UNDER ANESTHESIA WITH HEMORRHOIDECTOMY;  Surgeon: [name], MD;  Location: [facility];  Service: General;  Laterality: N/A;   SKIN CANCER EXCISION     TOE SURGERY  01/2012   joint of left great toe   TONSILLECTOMY     TONSILLECTOMY     TYMPANOPLASTY     WRIST SURGERY     left   Social History:   reports that he quit smoking about 10 months ago. His smoking use included cigarettes. He has a 9.50 pack-year smoking history. He has quit using smokeless tobacco.   His smokeless tobacco use included snuff. He reports current alcohol use. He reports that he does not use drugs.  Family History  Problem Relation Age of Onset   Cancer Mother        Breast   Heart attack Father 73   COPD Father    Heart disease Father    Osler-Weber-Rendu syndrome Brother    Asthma Daughter    Lupus Daughter    Diabetes Other        Grandson    Colon cancer Neg Hx    Pancreatic cancer Neg Hx    Esophageal cancer Neg Hx    Stomach cancer Neg Hx    Rectal cancer Neg Hx     Medications: Patient's Medications  New Prescriptions   No medications on file  Previous Medications   ACETAMINOPHEN (TYLENOL) 500 MG TABLET    Take 1,000 mg by mouth every 6 (six) hours as needed.   ALBUTEROL (PROVENTIL HFA;VENTOLIN HFA) 108 (90 BASE) MCG/ACT INHALER    Inhale 2 puffs into the lungs every 6 (six) hours as needed. For cough and wheezing.   ALLOPURINOL (ZYLOPRIM) 100 MG TABLET    Take 1 tablet by mouth 2 (two) times daily.   ALPRAZOLAM (XANAX) 0.25 MG TABLET    Take 1 tablet (0.25 mg total) by mouth daily as needed for anxiety (to last 30 days).   ASPIRIN EC 81 MG TABLET    Take 81 mg by mouth daily.   BUPROPION (WELLBUTRIN XL) 150 MG 24 HR TABLET    TAKE ONE TABLET BY MOUTH ONCE DAILY FOR 3 DAYS, THEN INCREASE TO ONE BY MOUTH TWICE DAILY.   COLCHICINE 0.6 MG CAPS    Take 1 capsule by mouth 2 (two) times daily as needed.   DICLOFENAC (CATAFLAM) 50 MG TABLET    Take 1 tablet by mouth 2 times every day as needed   FLUTICASONE FUROATE-VILANTEROL (BREO ELLIPTA) 200-25 MCG/ACT AEPB    Inhale 1 puff into the lungs once daily.   HYDROCHLOROTHIAZIDE (HYDRODIURIL) 25 MG TABLET    Take 1 tablet by mouth daily.   LOSARTAN (COZAAR) 100 MG TABLET    Take 1 tablet (100 mg total) by mouth daily.   PANTOPRAZOLE (PROTONIX) 40 MG TABLET    TAKE 1 TABLET BY MOUTH ONCE A DAY   SILDENAFIL (VIAGRA) 50 MG TABLET    TAKE 1 TABLET BY MOUTH AS NEEDED FOR ERECTILE DYSFUNCTION   SIMVASTATIN (ZOCOR) 20 MG  TABLET    TAKE 1 TABLET BY MOUTH AT BEDTIME  Modified Medications   No medications on file  Discontinued Medications   ASCORBIC ACID (VITAMIN C) 1000 MG TABLET    Take 1,000 mg by mouth daily.   CHOLECALCIFEROL (VITAMIN D3) 25 MCG (1000 UNIT) TABLET    Take 2,000 Units by mouth daily.   DICLOFENAC (CATAFLAM) 50 MG TABLET    Take 1 tablet by mouth 2 times every day as needed   GUAIFENESIN-DEXTROMETHORPHAN (ROBITUSSIN DM) 100-10 MG/5ML SYRUP  Take 5 mLs by mouth every 4 (four) hours as needed for cough.   ZINC GLUCONATE 50 MG TABLET    Take 50 mg by mouth daily.    Physical Exam:  Vitals:   09/30/21 0821  BP: 130/82  Pulse: 79  Temp: (!) 97.1 F (36.2 C)  TempSrc: Temporal  SpO2: 97%  Weight: 233 lb (105.7 kg)  Height: 6\' 1"  (1.854 m)   Body mass index is 30.74 kg/m. Wt Readings from Last 3 Encounters:  09/30/21 233 lb (105.7 kg)  04/29/21 232 lb 9.6 oz (105.5 kg)  04/08/21 236 lb (107 kg)    Physical Exam Constitutional:      General: He is not in acute distress.    Appearance: He is well-developed. He is not diaphoretic.  HENT:     Head: Normocephalic and atraumatic.     Right Ear: External ear normal.     Left Ear: External ear normal.     Mouth/Throat:     Pharynx: No oropharyngeal exudate.  Eyes:     Conjunctiva/sclera: Conjunctivae normal.     Pupils: Pupils are equal, round, and reactive to light.  Cardiovascular:     Rate and Rhythm: Normal rate and regular rhythm.     Heart sounds: Normal heart sounds.  Pulmonary:     Effort: Pulmonary effort is normal.     Breath sounds: Normal breath sounds.  Abdominal:     General: Bowel sounds are normal.     Palpations: Abdomen is soft.  Musculoskeletal:        General: No tenderness.     Cervical back: Normal range of motion and neck supple.     Right lower leg: No edema.     Left lower leg: No edema.  Skin:    General: Skin is warm and dry.  Neurological:     Mental Status: He is alert and oriented to  person, place, and time.  Psychiatric:        Mood and Affect: Mood normal.    Labs reviewed: Basic Metabolic Panel: Recent Labs    10/01/20 0612 10/02/20 0534 03/11/21 0936  NA 138 139 140  K 3.6 4.2 4.4  CL 102 104 105  CO2 28 23 27   GLUCOSE 94 93 111*  BUN 7* 9 13  CREATININE 0.79 0.87 0.96  CALCIUM 8.6* 8.7* 9.2   Liver Function Tests: Recent Labs    09/30/20 1406 10/01/20 0612 03/11/21 0936  AST 36 27 27  ALT 37 31 32  ALKPHOS 68 61  --   BILITOT 0.8 0.8 0.4  PROT 7.3 6.6 6.6  ALBUMIN 4.0 3.6  --    No results for input(s): LIPASE, AMYLASE in the last 8760 hours. No results for input(s): AMMONIA in the last 8760 hours. CBC: Recent Labs    09/30/20 1406 10/01/20 0612 10/02/20 0534 03/11/21 0936  WBC 8.8 7.2 7.0 7.8  NEUTROABS 5.7  --   --  4,789  HGB 14.7 14.0 14.6 16.2  HCT 43.1 42.6 44.1 48.9  MCV 93.7 96.4 95.7 91.1  PLT 244 241 261 295   Lipid Panel: Recent Labs    03/11/21 0936  CHOL 160  HDL 52  LDLCALC 81  TRIG 170*  CHOLHDL 3.1   TSH: No results for input(s): TSH in the last 8760 hours. A1C: Lab Results  Component Value Date   HGBA1C 5.4 09/18/2020     Assessment/Plan 1. Essential hypertension --stable.however with increase gout will have him stop HCTZ  and start norvasc 2.5 mg daily with low sodium diet.  He will continue losartan 100 mg daily - amLODipine (NORVASC) 2.5 MG tablet; Take 1 tablet (2.5 mg total) by mouth daily.  Dispense: 30 tablet; Refill: 1  2. Anxiety -stable, continues on wellbutrin for mood and xanax PRN.  3. Gout, unspecified cause, unspecified chronicity, unspecified site Has made dietary modifications.   4. Moderate persistent asthma without complication -stable on breo.    Next appt: 1 month.  Aaron [name]. [place], [facility] [phone]

## 2021-10-04 ENCOUNTER — Other Ambulatory Visit (HOSPITAL_COMMUNITY): Payer: Self-pay

## 2021-10-07 ENCOUNTER — Ambulatory Visit: Payer: 59 | Admitting: Nurse Practitioner

## 2021-10-21 ENCOUNTER — Other Ambulatory Visit: Payer: 59

## 2021-10-21 ENCOUNTER — Other Ambulatory Visit (HOSPITAL_COMMUNITY): Payer: Self-pay

## 2021-10-21 ENCOUNTER — Other Ambulatory Visit: Payer: Self-pay

## 2021-10-25 ENCOUNTER — Ambulatory Visit: Payer: 59 | Admitting: Nurse Practitioner

## 2021-10-28 ENCOUNTER — Other Ambulatory Visit (HOSPITAL_COMMUNITY): Payer: Self-pay

## 2021-10-28 ENCOUNTER — Other Ambulatory Visit: Payer: Self-pay | Admitting: Nurse Practitioner

## 2021-10-28 ENCOUNTER — Other Ambulatory Visit: Payer: 59

## 2021-10-28 DIAGNOSIS — N529 Male erectile dysfunction, unspecified: Secondary | ICD-10-CM

## 2021-10-28 MED ORDER — SILDENAFIL CITRATE 50 MG PO TABS
ORAL_TABLET | ORAL | 1 refills | Status: DC
  Filled 2021-10-28: qty 10, 60d supply, fill #0
  Filled 2021-12-05: qty 10, 60d supply, fill #1

## 2021-10-28 MED ORDER — DICLOFENAC POTASSIUM 50 MG PO TABS
ORAL_TABLET | ORAL | 2 refills | Status: DC
  Filled 2021-10-28: qty 60, 30d supply, fill #0
  Filled 2021-12-31: qty 60, 30d supply, fill #1
  Filled 2022-02-07: qty 60, 30d supply, fill #2

## 2021-11-04 ENCOUNTER — Other Ambulatory Visit: Payer: Self-pay

## 2021-11-04 ENCOUNTER — Telehealth: Payer: Self-pay | Admitting: Orthopedic Surgery

## 2021-11-04 ENCOUNTER — Other Ambulatory Visit: Payer: Self-pay | Admitting: Orthopedic Surgery

## 2021-11-04 ENCOUNTER — Other Ambulatory Visit: Payer: 59

## 2021-11-04 DIAGNOSIS — M1A0410 Idiopathic chronic gout, right hand, without tophus (tophi): Secondary | ICD-10-CM

## 2021-11-04 NOTE — Telephone Encounter (Signed)
Pt called requesting a order be sent to [facility] in [place]. Pt states when he came in our office that we had problems getting blood work. T also asked PCP to draw his blood work and they couldn't get it. [facility] send orders to [facility] in [place] to be done. Please call pt about this matter at [phone].

## 2021-11-04 NOTE — Telephone Encounter (Signed)
Looking at last note, it is for a uric acid level. PCP office wasn't successful in getting blood. Will send an order to [facility] for uric acid.

## 2021-11-04 NOTE — Telephone Encounter (Signed)
I put in future lab order for uric acid, printed off order form for pt to p/u. I do have an order in the chart for uric acid, which needs to be cancelled. Was made by mistake for [facility] instead of [facility].

## 2021-11-04 NOTE — Telephone Encounter (Signed)
I did call pt, to inform him on one of the order forms he needs to sign to give [place] permission to send to [facility]. I let him know that he will need to come to office first. Left papers at check in desk.

## 2021-11-05 ENCOUNTER — Other Ambulatory Visit: Payer: Self-pay | Admitting: Nurse Practitioner

## 2021-11-05 ENCOUNTER — Other Ambulatory Visit (HOSPITAL_COMMUNITY): Payer: Self-pay

## 2021-11-05 DIAGNOSIS — F17200 Nicotine dependence, unspecified, uncomplicated: Secondary | ICD-10-CM

## 2021-11-05 MED ORDER — BUPROPION HCL ER (XL) 150 MG PO TB24
ORAL_TABLET | ORAL | 3 refills | Status: DC
  Filled 2021-11-05: qty 60, 30d supply, fill #0
  Filled 2021-12-05: qty 60, 30d supply, fill #1

## 2021-11-05 NOTE — Telephone Encounter (Signed)
Patient has request refill on medication "Bupropion". Patient last refill was 06/28/2021. Please edit medication instructions. Medication pend and sent to PCP [name], NP . Please Advise.

## 2021-11-06 ENCOUNTER — Other Ambulatory Visit (HOSPITAL_COMMUNITY): Payer: Self-pay

## 2021-11-06 ENCOUNTER — Other Ambulatory Visit: Payer: Self-pay | Admitting: Nurse Practitioner

## 2021-11-06 DIAGNOSIS — M1A0410 Idiopathic chronic gout, right hand, without tophus (tophi): Secondary | ICD-10-CM | POA: Diagnosis not present

## 2021-11-06 DIAGNOSIS — I10 Essential (primary) hypertension: Secondary | ICD-10-CM

## 2021-11-06 MED ORDER — LOSARTAN POTASSIUM 100 MG PO TABS
100.0000 mg | ORAL_TABLET | Freq: Every day | ORAL | 1 refills | Status: DC
  Filled 2021-11-06: qty 90, 90d supply, fill #0
  Filled 2022-02-07: qty 90, 90d supply, fill #1

## 2021-11-06 NOTE — Telephone Encounter (Signed)
Pt came yesterday to sign the order, he did take with him to bring to [facility] himself.

## 2021-11-07 LAB — URIC ACID: Uric Acid: 5.6 mg/dL (ref 3.8–8.4)

## 2021-11-11 ENCOUNTER — Ambulatory Visit (INDEPENDENT_AMBULATORY_CARE_PROVIDER_SITE_OTHER): Payer: 59 | Admitting: Orthopedic Surgery

## 2021-11-11 ENCOUNTER — Encounter: Payer: Self-pay | Admitting: Orthopedic Surgery

## 2021-11-11 DIAGNOSIS — M1A0410 Idiopathic chronic gout, right hand, without tophus (tophi): Secondary | ICD-10-CM

## 2021-11-11 NOTE — Progress Notes (Signed)
Office Visit Note   Patient: Aaron [name]           Date of Birth: 08/14/1956           MRN: [id number] Visit Date: 11/11/2021              Requested by: [name], NP [place],  [place] [id number] PCP: [name], NP  Chief Complaint  Patient presents with   Right Foot - Follow-up   Right Hand - Follow-up      HPI: Patient is a 65 year old gentleman who presents in follow-up for gout right hand MCP joint long finger.  Patient was started on allopurinol and colchicine.  Patient states he stopped the allopurinol due to irritability and anxiety is currently taken Voltaren he states he has had no flareups.  He states his blood pressure medicine has been changed and this may have contributed to his gouty flareup.  Patient's uric acid upon initial presentation was 10.4 is currently 5.6.  Patient states he is also been worked up for cervical spine pain and he was told that this was degenerative.  Assessment & Plan: Visit Diagnoses:  1. Idiopathic chronic gout of right hand without tophus     Plan: Recommend use the colchicine for flareups hold on the allopurinol since he had a reaction to this medication.  Follow-Up Instructions: Return if symptoms worsen or fail to improve.   Ortho Exam  Patient is alert, oriented, no adenopathy, well-dressed, normal affect, normal respiratory effort. Examination the right hand has no redness no cellulitis the swelling around the MCP joint has resolved significantly.  There is no tenderness to palpation no redness.  No tophaceous changes.  Imaging: No results found. No images are attached to the encounter.  Labs: Lab Results  Component Value Date   HGBA1C 5.4 09/18/2020   HGBA1C 6.0 (H) 08/09/2015   HGBA1C 5.9 (H) 12/12/2014   ESRSEDRATE 13 10/01/2020   ESRSEDRATE 11 06/13/2020   CRP 8.5 (H) 10/01/2020   LABURIC 5.6 11/06/2021   LABURIC 10.4 (H) 08/26/2021   LABURIC 6.3 04/04/2014   REPTSTATUS  10/06/2020 FINAL 09/30/2020   CULT  09/30/2020    NO GROWTH 6 DAYS Performed at [facility], [address], [place], [place] [address]      Lab Results  Component Value Date   ALBUMIN 3.6 10/01/2020   ALBUMIN 4.0 09/30/2020   ALBUMIN 4.2 08/12/2019    Lab Results  Component Value Date   MG 1.7 09/22/2012   No results found for: VD25OH  No results found for: PREALBUMIN CBC EXTENDED Latest Ref Rng & Units 03/11/2021 10/02/2020 10/01/2020  WBC 3.8 - 10.8 Thousand/uL 7.8 7.0 7.2  RBC 4.20 - 5.80 Million/uL 5.37 4.61 4.42  HGB 13.2 - 17.1 g/dL 16.2 14.6 14.0  HCT 38.5 - 50.0 % 48.9 44.1 42.6  PLT 140 - 400 Thousand/uL 295 261 241  NEUTROABS 1,500 - 7,800 cells/uL 4,789 - -  LYMPHSABS 850 - 3,900 cells/uL 1,903 - -     There is no height or weight on file to calculate BMI.  Orders:  No orders of the defined types were placed in this encounter.  No orders of the defined types were placed in this encounter.    Procedures: No procedures performed  Clinical Data: No additional findings.  ROS:  All other systems negative, except as noted in the HPI. Review of Systems  Objective: Vital Signs: There were no vitals taken for this  visit.  Specialty Comments:  No specialty comments available.  PMFS History: Patient Active Problem List   Diagnosis Date Noted   Cellulitis and abscess of hand 10/01/2020   Cellulitis, leg 10/01/2020   Dog bite 09/30/2020   Thrombosed external hemorrhoid 62/37/6283   Uncomplicated asthma 15/17/6160   High risk medication use 12/24/2016   Prostate cancer screening 12/24/2016   Diarrhea 09/17/2016   Other emphysema ([place]) 04/15/2015   Erectile dysfunction 12/12/2014   Fatty liver 04/04/2014   Pulmonary nodules 04/04/2014   Chronic back pain 12/06/2013   Gout 12/06/2013   Routine general medical examination at a health care facility 12/06/2013   GERD (gastroesophageal reflux disease) 12/06/2013   Tongue ulcer 12/06/2013   AAA  (abdominal aortic aneurysm) without rupture 11/01/2013   Depression 06/29/2013   Musculoskeletal pain 06/07/2013   Lung nodule, multiple 05/26/2013   Chronic cough 03/21/2013   Gouty arthropathy 02/23/2013   Prediabetes 02/23/2013   Need for prophylactic vaccination and inoculation against influenza 09/21/2012   Pre-operative respiratory examination 09/21/2012   Asthma, moderate persistent 03/05/2011   HYPERLIPIDEMIA 01/20/2011   Hx of smoking 01/20/2011   Essential hypertension 01/20/2011   Past Medical History:  Diagnosis Date   Abdominal aortic aneurysm    sees Dr. Einar Gip for cardiac f/u, [phone]   Arthritis    lower back   Asthma    followed by Dr. Berdie Ogren   Chronic airway obstruction, not elsewhere classified    Dyspnea    normal PFT April 2012   External hemorrhoids without mention of complication    GERD (gastroesophageal reflux disease)    Gout    H/O hiatal hernia    Hemorrhoids    HTN (hypertension)    hx of sees Dr. Graylin Shiver   Hyperlipidemia    Insomnia, unspecified    Other abnormal blood chemistry    Other dyspnea and respiratory abnormality    Other malaise and fatigue    Other specified erythematous condition(695.89)    Other testicular dysfunction    Prostatitis, unspecified    Rash 2011   left chest, biopsy 2012 Dr. Rozann Lesches, results pending   Skin cancer    Tobacco abuse     Family History  Problem Relation Age of Onset   Cancer Mother        Breast   Heart attack Father 35   COPD Father    Heart disease Father    Osler-Weber-Rendu syndrome Brother    Asthma Daughter    Lupus Daughter    Diabetes Other        Grandson    Colon cancer Neg Hx    Pancreatic cancer Neg Hx    Esophageal cancer Neg Hx    Stomach cancer Neg Hx    Rectal cancer Neg Hx     Past Surgical History:  Procedure Laterality Date   ABDOMINAL AORTIC ENDOVASCULAR STENT GRAFT  09/22/2012   Procedure: ABDOMINAL AORTIC ENDOVASCULAR STENT GRAFT;  Surgeon: Mal Misty, MD;  Location: [facility];  Service: Vascular;  Laterality: N/A;  GORE; ultrasound guided.   APPENDECTOMY     EVALUATION UNDER ANESTHESIA WITH HEMORRHOIDECTOMY N/A 12/31/2016   Procedure: EXAM UNDER ANESTHESIA WITH HEMORRHOIDECTOMY;  Surgeon: [name], MD;  Location: [facility];  Service: General;  Laterality: N/A;   SKIN CANCER EXCISION     TOE SURGERY  01/2012   joint of left great toe   TONSILLECTOMY     TONSILLECTOMY     TYMPANOPLASTY  WRIST SURGERY     left   Social History   Occupational History   Occupation: CNA    Employer: [employer]  Tobacco Use   Smoking status: Former    Packs/day: 0.25    Years: 38.00    Pack years: 9.50    Types: Cigarettes    Quit date: 10/31/2020    Years since quitting: 1.0   Smokeless tobacco: Former    Types: Snuff  Vaping Use   Vaping Use: Never used  Substance and Sexual Activity   Alcohol use: Yes    Alcohol/week: 0.0 standard drinks    Comment: occ   Drug use: No   Sexual activity: Not on file

## 2021-11-13 ENCOUNTER — Other Ambulatory Visit: Payer: Self-pay | Admitting: Nurse Practitioner

## 2021-11-13 ENCOUNTER — Other Ambulatory Visit (HOSPITAL_COMMUNITY): Payer: Self-pay

## 2021-11-13 DIAGNOSIS — F419 Anxiety disorder, unspecified: Secondary | ICD-10-CM

## 2021-11-13 NOTE — Telephone Encounter (Signed)
RX last refilled on 08/15/21 # 20 with 1 refill  Treatment agreement on file is outdated, notation made on pending appointment for patient to update treatment agreement on 12/16/2021

## 2021-11-14 ENCOUNTER — Other Ambulatory Visit (HOSPITAL_COMMUNITY): Payer: Self-pay

## 2021-11-14 MED ORDER — ALPRAZOLAM 0.25 MG PO TABS
0.2500 mg | ORAL_TABLET | Freq: Every day | ORAL | 1 refills | Status: DC | PRN
  Filled 2021-11-14: qty 20, 30d supply, fill #0
  Filled 2021-12-18: qty 20, 30d supply, fill #1

## 2021-11-15 ENCOUNTER — Other Ambulatory Visit (HOSPITAL_COMMUNITY): Payer: Self-pay

## 2021-11-15 ENCOUNTER — Other Ambulatory Visit: Payer: Self-pay | Admitting: Nurse Practitioner

## 2021-11-15 MED ORDER — FLUTICASONE FUROATE-VILANTEROL 200-25 MCG/ACT IN AEPB
INHALATION_SPRAY | RESPIRATORY_TRACT | 1 refills | Status: DC
  Filled 2021-11-15: qty 60, 30d supply, fill #0
  Filled 2021-12-18: qty 60, 30d supply, fill #1

## 2021-11-27 ENCOUNTER — Other Ambulatory Visit: Payer: Self-pay | Admitting: Nurse Practitioner

## 2021-11-27 ENCOUNTER — Other Ambulatory Visit (HOSPITAL_COMMUNITY): Payer: Self-pay

## 2021-11-27 DIAGNOSIS — I10 Essential (primary) hypertension: Secondary | ICD-10-CM

## 2021-11-27 MED ORDER — AMLODIPINE BESYLATE 2.5 MG PO TABS
2.5000 mg | ORAL_TABLET | Freq: Every day | ORAL | 1 refills | Status: DC
  Filled 2021-11-27: qty 30, 30d supply, fill #0
  Filled 2021-12-24: qty 30, 30d supply, fill #1

## 2021-11-28 ENCOUNTER — Other Ambulatory Visit (HOSPITAL_COMMUNITY): Payer: Self-pay

## 2021-12-05 ENCOUNTER — Other Ambulatory Visit (HOSPITAL_COMMUNITY): Payer: Self-pay

## 2021-12-16 ENCOUNTER — Ambulatory Visit: Payer: 59 | Admitting: Nurse Practitioner

## 2021-12-18 ENCOUNTER — Other Ambulatory Visit: Payer: Self-pay | Admitting: Nurse Practitioner

## 2021-12-18 ENCOUNTER — Other Ambulatory Visit (HOSPITAL_COMMUNITY): Payer: Self-pay

## 2021-12-18 DIAGNOSIS — E782 Mixed hyperlipidemia: Secondary | ICD-10-CM

## 2021-12-18 MED ORDER — SIMVASTATIN 20 MG PO TABS
ORAL_TABLET | Freq: Every day | ORAL | 1 refills | Status: DC
  Filled 2021-12-18: qty 90, 90d supply, fill #0
  Filled 2022-03-10: qty 90, 90d supply, fill #1

## 2021-12-24 ENCOUNTER — Other Ambulatory Visit (HOSPITAL_COMMUNITY): Payer: Self-pay

## 2021-12-31 ENCOUNTER — Other Ambulatory Visit (HOSPITAL_COMMUNITY): Payer: Self-pay

## 2022-01-03 ENCOUNTER — Other Ambulatory Visit: Payer: Self-pay

## 2022-01-03 ENCOUNTER — Ambulatory Visit: Payer: 59 | Admitting: Nurse Practitioner

## 2022-01-03 ENCOUNTER — Encounter: Payer: Self-pay | Admitting: Nurse Practitioner

## 2022-01-03 VITALS — BP 138/100 | HR 72 | Temp 98.0°F | Ht 73.0 in | Wt 232.6 lb

## 2022-01-03 DIAGNOSIS — Z87891 Personal history of nicotine dependence: Secondary | ICD-10-CM

## 2022-01-03 DIAGNOSIS — F419 Anxiety disorder, unspecified: Secondary | ICD-10-CM

## 2022-01-03 DIAGNOSIS — M47812 Spondylosis without myelopathy or radiculopathy, cervical region: Secondary | ICD-10-CM

## 2022-01-03 DIAGNOSIS — I714 Abdominal aortic aneurysm, without rupture, unspecified: Secondary | ICD-10-CM | POA: Diagnosis not present

## 2022-01-03 DIAGNOSIS — I10 Essential (primary) hypertension: Secondary | ICD-10-CM

## 2022-01-03 DIAGNOSIS — M109 Gout, unspecified: Secondary | ICD-10-CM

## 2022-01-03 DIAGNOSIS — J411 Mucopurulent chronic bronchitis: Secondary | ICD-10-CM

## 2022-01-03 DIAGNOSIS — E782 Mixed hyperlipidemia: Secondary | ICD-10-CM | POA: Diagnosis not present

## 2022-01-03 MED ORDER — BUPROPION HCL ER (XL) 150 MG PO TB24: 150.0000 mg | ORAL_TABLET | Freq: Two times a day (BID) | ORAL | 1 refills | Status: DC

## 2022-01-03 NOTE — Patient Instructions (Addendum)
Increase norvasc to 5 mg daily   Restart allopurinol 100 mg daily  Continue low purine diet.   Increase physical activity 30 mins/5 days a week.   Follow up fasting labs in 6 weeks.

## 2022-01-03 NOTE — Progress Notes (Signed)
Careteam: [name], NP as PCP - General (Nurse Practitioner) [name], [name], NP (Inactive) as Nurse Practitioner (Vascular Surgery) [name], MD as Consulting Physician (General Surgery) Brand Males, MD as Consulting Physician (Pulmonary Disease) [name], MD as Consulting Physician (Cardiology) [name], PA-C as Physician Assistant (Dermatology)  PLACE OF SERVICE:  [facility] Directive information    No Known Allergies  Chief Complaint  Patient presents with   Follow-up    1 month follow up on blood pressure. Patient needs to sign updated medication contract     HPI: Patient is a 66 y.o. male here for a follow up for chronic conditions.   Hypertension - Pt takes Amlodipine 2.5 mg QD and Losartan 100 mg daily. No issues. Blood pressure at home 136-155/80s-90s  Gout - Just a few flare ups since being off HCTZ, no longer takes allopurinol, stopped it last month . Takes colchicine 0.6 mg for flare ups  Cervical Spondylosis- seen by [facility]. Prescribed Diclofenac 50 mg. Thinks stress is making the pain worse.   Anxiety- Takes Wellbutrin XL helps with not smoking, Xanax PRN. No issues with medications. States it has been helping him.   Exercise- No physical activity   Diet: avoids shrimp and eats red meat occasionally, 1 alcohol beverage sometimes at night, 3 small sodas daily. Consume fruits and veggies  Stopped smoking the past year   Lost weight : 2 inches off pants, 222 lb at home without clothing.    Review of Systems:  Review of Systems  Constitutional:  Negative for chills, fever, malaise/fatigue and weight loss.  Respiratory:  Negative for cough, sputum production, shortness of breath and wheezing.   Cardiovascular:  Negative for chest pain, palpitations and leg swelling.  Gastrointestinal:  Negative for abdominal pain, constipation, diarrhea, heartburn, nausea  and vomiting.  Genitourinary: Negative.  Negative for dysuria and frequency.  Musculoskeletal:  Positive for neck pain (takes diclofenac).  Neurological:  Negative for tremors, weakness and headaches.  Psychiatric/Behavioral:  The patient is nervous/anxious.        Stress, on xanax.   Past Medical History:  Diagnosis Date   Abdominal aortic aneurysm    sees Dr. Einar Gip for cardiac f/u, [phone]   Arthritis    lower back   Asthma    followed by Dr. Berdie Ogren   Chronic airway obstruction, not elsewhere classified    Dyspnea    normal PFT April 2012   External hemorrhoids without mention of complication    GERD (gastroesophageal reflux disease)    Gout    H/O hiatal hernia    Hemorrhoids    HTN (hypertension)    hx of sees Dr. Graylin Shiver   Hyperlipidemia    Insomnia, unspecified    Other abnormal blood chemistry    Other dyspnea and respiratory abnormality    Other malaise and fatigue    Other specified erythematous condition(695.89)    Other testicular dysfunction    Prostatitis, unspecified    Rash 2011   left chest, biopsy 2012 Dr. Rozann Lesches, results pending   Skin cancer    Tobacco abuse    Past Surgical History:  Procedure Laterality Date   ABDOMINAL AORTIC ENDOVASCULAR STENT GRAFT  09/22/2012   Procedure: ABDOMINAL AORTIC ENDOVASCULAR STENT GRAFT;  Surgeon: Mal Misty, MD;  Location: [facility];  Service: Vascular;  Laterality: N/A;  GORE; ultrasound guided.   APPENDECTOMY     EVALUATION UNDER ANESTHESIA WITH  HEMORRHOIDECTOMY N/A 12/31/2016   Procedure: EXAM UNDER ANESTHESIA WITH HEMORRHOIDECTOMY;  Surgeon: [name], MD;  Location: [facility];  Service: General;  Laterality: N/A;   SKIN CANCER EXCISION     TOE SURGERY  01/2012   joint of left great toe   TONSILLECTOMY     TONSILLECTOMY     TYMPANOPLASTY     WRIST SURGERY     left   Social History:   reports that he quit smoking about 14 months ago. His smoking use included cigarettes.  He has a 9.50 pack-year smoking history. He has quit using smokeless tobacco.  His smokeless tobacco use included snuff. He reports current alcohol use. He reports that he does not use drugs.  Family History  Problem Relation Age of Onset   Cancer Mother        Breast   Heart attack Father 54   COPD Father    Heart disease Father    Osler-Weber-Rendu syndrome Brother    Asthma Daughter    Lupus Daughter    Diabetes Other        Grandson    Colon cancer Neg Hx    Pancreatic cancer Neg Hx    Esophageal cancer Neg Hx    Stomach cancer Neg Hx    Rectal cancer Neg Hx     Medications: Patient's Medications  New Prescriptions   No medications on file  Previous Medications   ACETAMINOPHEN (TYLENOL) 500 MG TABLET    Take 1,000 mg by mouth every 6 (six) hours as needed.   ALBUTEROL (PROVENTIL HFA;VENTOLIN HFA) 108 (90 BASE) MCG/ACT INHALER    Inhale 2 puffs into the lungs every 6 (six) hours as needed. For cough and wheezing.   ALPRAZOLAM (XANAX) 0.25 MG TABLET    Take 1 tablet (0.25 mg total) by mouth daily as needed for anxiety (to last 30 days).   AMLODIPINE (NORVASC) 2.5 MG TABLET    Take 1 tablet (2.5 mg total) by mouth daily.   ASPIRIN EC 81 MG TABLET    Take 81 mg by mouth daily.   BUPROPION (WELLBUTRIN XL) 150 MG 24 HR TABLET    TAKE 1 TABLET BY MOUTH ONCE DAILY FOR 3 DAYS, THEN INCREASE TO 1 TABLET  BY MOUTH TWICE DAILY.   COLCHICINE 0.6 MG CAPS    Take 1 capsule by mouth 2 (two) times daily as needed.   DICLOFENAC (CATAFLAM) 50 MG TABLET    Take 1 tablet by mouth 2 times every day as needed   FLUTICASONE FUROATE-VILANTEROL (BREO ELLIPTA) 200-25 MCG/ACT AEPB    Inhale 1 puff into the lungs once daily.   LOSARTAN (COZAAR) 100 MG TABLET    Take 1 tablet (100 mg total) by mouth daily.   PANTOPRAZOLE (PROTONIX) 40 MG TABLET    TAKE 1 TABLET BY MOUTH ONCE A DAY   SILDENAFIL (VIAGRA) 50 MG TABLET    TAKE 1 TABLET BY MOUTH AS NEEDED FOR ERECTILE DYSFUNCTION   SIMVASTATIN (ZOCOR) 20 MG  TABLET    TAKE 1 TABLET BY MOUTH AT BEDTIME  Modified Medications   No medications on file  Discontinued Medications   ALLOPURINOL (ZYLOPRIM) 100 MG TABLET    Take 1 tablet by mouth 2 (two) times daily.    Physical Exam:  Vitals:   01/03/22 0808 01/03/22 0900 01/03/22 0901  BP: (!) 160/100 140/90 (!) 138/100  Pulse: 72    Temp: 98 F (36.7 C)    SpO2: 98%  Weight: 232 lb 9.6 oz (105.5 kg)    Height: _0  (1.854 m)     Body mass index is 30.69 kg/m. Wt Readings from Last 3 Encounters:  01/03/22 232 lb 9.6 oz (105.5 kg)  09/30/21 233 lb (105.7 kg)  04/29/21 232 lb 9.6 oz (105.5 kg)    Physical Exam Constitutional:      General: He is not in acute distress.    Appearance: Normal appearance. He is normal weight. He is not ill-appearing.  HENT:     Right Ear: Tympanic membrane and external ear normal. There is no impacted cerumen.     Left Ear: Tympanic membrane, ear canal and external ear normal. There is no impacted cerumen.  Cardiovascular:     Rate and Rhythm: Normal rate and regular rhythm.     Pulses: Normal pulses.     Heart sounds: Normal heart sounds.  Pulmonary:     Effort: Pulmonary effort is normal. No respiratory distress.     Comments: Sounds clear but diminished  Abdominal:     General: Bowel sounds are normal. There is distension.  Musculoskeletal:        General: No swelling. Normal range of motion.     Right lower leg: No edema.     Left lower leg: No edema.  Neurological:     Mental Status: He is alert and oriented to person, place, and time.  Psychiatric:        Mood and Affect: Mood normal.        Behavior: Behavior normal.        Thought Content: Thought content normal.        Judgment: Judgment normal.    Labs reviewed: Basic Metabolic Panel: Recent Labs    03/11/21 0936  NA 140  K 4.4  CL 105  CO2 27  GLUCOSE 111*  BUN 13  CREATININE 0.96  CALCIUM 9.2   Liver Function Tests: Recent Labs    03/11/21 0936  AST 27  ALT 32   BILITOT 0.4  PROT 6.6   No results for input(s): LIPASE, AMYLASE in the last 8760 hours. No results for input(s): AMMONIA in the last 8760 hours. CBC: Recent Labs    03/11/21 0936  WBC 7.8  NEUTROABS 4,789  HGB 16.2  HCT 48.9  MCV 91.1  PLT 295   Lipid Panel: Recent Labs    03/11/21 0936  CHOL 160  HDL 52  LDLCALC 81  TRIG 170*  CHOLHDL 3.1   TSH: No results for input(s): TSH in the last 8760 hours. A1C: Lab Results  Component Value Date   HGBA1C 5.4 09/18/2020     Assessment/Plan   1. Essential hypertension - CMP with eGFR([facility])(future) - CBC with Differential/Platelet (future) - Blood pressure elevated this office visit  8:08 am- 160/90 9:00 am -140/90 right arm  9:01 am-138/100 left arm  - Amlodipine increased this office visit from 2.5 mg to 5 mg QD -Will continue to monitor  -dietary modifications encouraged.   2. Gouty arthropathy -Advised to take Allopurinol daily to control gout flare up -to start Allopurinol 100 mg  to once daily to reduce uric acid and prevent flares  -continue dietary modifications.   3. Mixed hyperlipidemia -Lipid panel ordered  -Advised to eat a balanced diet  -Increase physical activity -Will continue to monitor  4. Anxiety - Takes xanax 0.25 mg PRN and wellbutrin XL 150 mg-No issues with medications  -Advised to stop drinking alcohol, this may increase anxiety -Increase  physical activity such as walking and strength training - Deep breathing exercise is advised when feeling overwhelmed with stress  -Will continue to monitor     5. Cervical spondylosis - Takes diclofenac 50 mg PRN -Advised to do strength training exercise to help reduce stress. Continues to follow up with neurosurgery    6. Chronic bronchitis with productive mucopurulent cough (HCC) Stable, continues on breo daily, has albuterol PRN.   7. History of smoking -reports he has had cessation of smoking for 1 year. Continues on Wellbutrin to help  curb cravings and help with mood - buPROPion (WELLBUTRIN XL) 150 MG 24 hr tablet; Take 1 tablet (150 mg total) by mouth 2 (two) times daily.  Dispense: 120 tablet; Refill: 1  8. Abdominal aortic aneurysm (AAA) without rupture, unspecified part Continues to be monitored by vascular.   Next appt: Return in about 14 weeks (around 04/11/2022) for CPE . I personally was present during the history, physical exam and medical decision-making activities of this service and have verified that the service and findings are accurately documented in the students note  Janett Billow K. [place], [facility] [phone]

## 2022-01-04 ENCOUNTER — Other Ambulatory Visit: Payer: Self-pay | Admitting: Nurse Practitioner

## 2022-01-04 ENCOUNTER — Other Ambulatory Visit (HOSPITAL_COMMUNITY): Payer: Self-pay

## 2022-01-04 DIAGNOSIS — F17200 Nicotine dependence, unspecified, uncomplicated: Secondary | ICD-10-CM

## 2022-01-08 ENCOUNTER — Other Ambulatory Visit (HOSPITAL_COMMUNITY): Payer: Self-pay

## 2022-01-09 ENCOUNTER — Other Ambulatory Visit (HOSPITAL_COMMUNITY): Payer: Self-pay

## 2022-01-09 ENCOUNTER — Other Ambulatory Visit: Payer: Self-pay | Admitting: Nurse Practitioner

## 2022-01-09 DIAGNOSIS — F17200 Nicotine dependence, unspecified, uncomplicated: Secondary | ICD-10-CM

## 2022-01-10 ENCOUNTER — Other Ambulatory Visit: Payer: Self-pay

## 2022-01-10 ENCOUNTER — Other Ambulatory Visit (HOSPITAL_COMMUNITY): Payer: Self-pay

## 2022-01-10 DIAGNOSIS — Z87891 Personal history of nicotine dependence: Secondary | ICD-10-CM

## 2022-01-10 MED ORDER — BUPROPION HCL ER (XL) 150 MG PO TB24
150.0000 mg | ORAL_TABLET | Freq: Every day | ORAL | 1 refills | Status: DC
  Filled 2022-01-10: qty 90, 90d supply, fill #0
  Filled 2022-04-09: qty 90, 90d supply, fill #1
  Filled 2022-07-08: qty 60, 60d supply, fill #2

## 2022-01-10 NOTE — Telephone Encounter (Signed)
High warning came up when trying to fill medication. Medication pended and sent to Sherrie Mustache, NP for approval

## 2022-01-13 ENCOUNTER — Other Ambulatory Visit: Payer: Self-pay | Admitting: Nurse Practitioner

## 2022-01-13 ENCOUNTER — Other Ambulatory Visit (HOSPITAL_COMMUNITY): Payer: Self-pay

## 2022-01-13 DIAGNOSIS — I10 Essential (primary) hypertension: Secondary | ICD-10-CM

## 2022-01-13 MED ORDER — AMLODIPINE BESYLATE 2.5 MG PO TABS
2.5000 mg | ORAL_TABLET | Freq: Every day | ORAL | 1 refills | Status: DC
  Filled 2022-01-13: qty 90, 90d supply, fill #0

## 2022-01-14 ENCOUNTER — Ambulatory Visit: Payer: 59 | Admitting: Physician Assistant

## 2022-01-14 ENCOUNTER — Other Ambulatory Visit (HOSPITAL_COMMUNITY): Payer: Self-pay

## 2022-01-16 ENCOUNTER — Other Ambulatory Visit: Payer: Self-pay | Admitting: Nurse Practitioner

## 2022-01-16 ENCOUNTER — Other Ambulatory Visit (HOSPITAL_COMMUNITY): Payer: Self-pay

## 2022-01-16 DIAGNOSIS — F419 Anxiety disorder, unspecified: Secondary | ICD-10-CM

## 2022-01-16 DIAGNOSIS — N529 Male erectile dysfunction, unspecified: Secondary | ICD-10-CM

## 2022-01-16 DIAGNOSIS — I10 Essential (primary) hypertension: Secondary | ICD-10-CM

## 2022-01-16 MED ORDER — ALPRAZOLAM 0.25 MG PO TABS
0.2500 mg | ORAL_TABLET | Freq: Every day | ORAL | 1 refills | Status: DC | PRN
  Filled 2022-01-16: qty 20, 20d supply, fill #0
  Filled 2022-02-15: qty 20, 20d supply, fill #1

## 2022-01-16 MED ORDER — FLUTICASONE FUROATE-VILANTEROL 200-25 MCG/ACT IN AEPB
INHALATION_SPRAY | RESPIRATORY_TRACT | 1 refills | Status: DC
  Filled 2022-01-16: qty 60, 30d supply, fill #0
  Filled 2022-02-17: qty 60, 30d supply, fill #1

## 2022-01-16 MED ORDER — SILDENAFIL CITRATE 50 MG PO TABS
ORAL_TABLET | ORAL | 1 refills | Status: DC
Start: 2022-01-16 — End: 2022-01-22
  Filled 2022-01-16: qty 10, fill #0

## 2022-01-16 MED ORDER — AMLODIPINE BESYLATE 5 MG PO TABS
5.0000 mg | ORAL_TABLET | Freq: Every day | ORAL | 1 refills | Status: DC
  Filled 2022-01-16: qty 90, 90d supply, fill #0

## 2022-01-16 NOTE — Telephone Encounter (Signed)
Patient has request refill on medication Fluticasone, Viagra, and Xanax. Patient last refill on medication in order 11/15/2021, 10/28/2021, and 11/14/2021. Patient has [Non Opioid Contract] on file date 01/08/2022. Patient medication pend and sent to PCP Dewaine Oats Carlos American, NP for approval.

## 2022-01-17 ENCOUNTER — Other Ambulatory Visit (HOSPITAL_COMMUNITY): Payer: Self-pay

## 2022-01-20 ENCOUNTER — Encounter: Payer: Self-pay | Admitting: Physician Assistant

## 2022-01-20 ENCOUNTER — Other Ambulatory Visit: Payer: Self-pay

## 2022-01-20 ENCOUNTER — Ambulatory Visit (INDEPENDENT_AMBULATORY_CARE_PROVIDER_SITE_OTHER): Payer: 59 | Admitting: Physician Assistant

## 2022-01-20 DIAGNOSIS — Z85828 Personal history of other malignant neoplasm of skin: Secondary | ICD-10-CM | POA: Diagnosis not present

## 2022-01-20 DIAGNOSIS — Z1283 Encounter for screening for malignant neoplasm of skin: Secondary | ICD-10-CM

## 2022-01-20 DIAGNOSIS — L821 Other seborrheic keratosis: Secondary | ICD-10-CM | POA: Diagnosis not present

## 2022-01-20 NOTE — Progress Notes (Signed)
° °  New Patient   Subjective  Aaron [name] is a 66 y.o. male who presents for the following: Annual Exam (Full body skin check, last ov 06-17-17 personal history of scc right shoulder).   The following portions of the chart were reviewed this encounter and updated as appropriate:  Tobacco   Allergies   Meds   Problems   Med Hx   Surg Hx   Fam Hx       Objective  Well appearing patient in no apparent distress; mood and affect are within normal limits.  A full examination was performed including scalp, head, eyes, ears, nose, lips, neck, chest, axillae, abdomen, back, buttocks, bilateral upper extremities, bilateral lower extremities, hands, feet, fingers, toes, fingernails, and toenails. All findings within normal limits unless otherwise noted below.  head to toe No atypical nevi or signs of NMSC noted at the time of the visit.    Assessment & Plan  Encounter for screening for malignant neoplasm of skin head to toe  Yearly skin examination    Seborrheic Keratoses - Stuck-on, waxy, tan-brown papules and plaques  - Discussed benign etiology and prognosis. - Observe - Call for any changes  No atypical nevi noted at the time of the visit.     I, Kalliope Riesen, PA-C, have reviewed all documentation's for this visit.  The documentation on 01/20/22 for the exam, diagnosis, procedures and orders are all accurate and complete.

## 2022-01-22 ENCOUNTER — Other Ambulatory Visit (HOSPITAL_COMMUNITY): Payer: Self-pay

## 2022-01-22 ENCOUNTER — Other Ambulatory Visit: Payer: Self-pay | Admitting: *Deleted

## 2022-01-22 DIAGNOSIS — N529 Male erectile dysfunction, unspecified: Secondary | ICD-10-CM

## 2022-01-22 MED ORDER — SILDENAFIL CITRATE 50 MG PO TABS
ORAL_TABLET | ORAL | 1 refills | Status: DC
  Filled 2022-01-22: qty 6, 30d supply, fill #0
  Filled 2022-02-17: qty 6, 30d supply, fill #1
  Filled 2022-03-26: qty 6, 30d supply, fill #2
  Filled 2022-05-05: qty 2, 15d supply, fill #3

## 2022-01-22 NOTE — Telephone Encounter (Signed)
Patient requested refill.  ?Pharmacy did not receive last Rx. Refaxed.  ?

## 2022-01-27 ENCOUNTER — Telehealth: Payer: Self-pay | Admitting: *Deleted

## 2022-01-27 MED ORDER — ALLOPURINOL 100 MG PO TABS: 100.0000 mg | ORAL_TABLET | Freq: Two times a day (BID) | ORAL | 5 refills | Status: DC

## 2022-01-27 NOTE — Telephone Encounter (Signed)
Medication list updated and sent to pharmacy.  ?

## 2022-01-27 NOTE — Telephone Encounter (Signed)
Yes okay to add and refill.  ?

## 2022-01-27 NOTE — Telephone Encounter (Signed)
Patient called and stated that he needs a refill on his Allopurinol.  ?Mediation is NOT in current medication list but patient states is it taking Allopurinol '100mg'$  twice daily.  ? ?He takes the Colchicine as needed for flare.  ? ?Is this ok to add to the medication list and sent to pharmacy.  ?Patient is going out of town tomorrow and needs it.  ? ?Requesting it to be sent to [place].  ? ?Please Advise.  ? ? ?

## 2022-02-07 ENCOUNTER — Other Ambulatory Visit (HOSPITAL_COMMUNITY): Payer: Self-pay

## 2022-02-15 ENCOUNTER — Other Ambulatory Visit (HOSPITAL_COMMUNITY): Payer: Self-pay

## 2022-02-17 ENCOUNTER — Other Ambulatory Visit: Payer: Self-pay

## 2022-02-17 ENCOUNTER — Other Ambulatory Visit (HOSPITAL_COMMUNITY): Payer: Self-pay

## 2022-02-17 ENCOUNTER — Other Ambulatory Visit: Payer: 59

## 2022-02-17 DIAGNOSIS — I10 Essential (primary) hypertension: Secondary | ICD-10-CM | POA: Diagnosis not present

## 2022-02-17 DIAGNOSIS — E782 Mixed hyperlipidemia: Secondary | ICD-10-CM | POA: Diagnosis not present

## 2022-02-17 DIAGNOSIS — M109 Gout, unspecified: Secondary | ICD-10-CM | POA: Diagnosis not present

## 2022-02-17 LAB — CBC WITH DIFFERENTIAL/PLATELET
Absolute Monocytes: 642 cells/uL (ref 200–950)
Basophils Absolute: 37 cells/uL (ref 0–200)
Basophils Relative: 0.5 %
Eosinophils Absolute: 183 cells/uL (ref 15–500)
Eosinophils Relative: 2.5 %
HCT: 45.6 % (ref 38.5–50.0)
Hemoglobin: 15.5 g/dL (ref 13.2–17.1)
Lymphs Abs: 1445 cells/uL (ref 850–3900)
MCH: 31.3 pg (ref 27.0–33.0)
MCHC: 34 g/dL (ref 32.0–36.0)
MCV: 91.9 fL (ref 80.0–100.0)
MPV: 10 fL (ref 7.5–12.5)
Monocytes Relative: 8.8 %
Neutro Abs: 4993 cells/uL (ref 1500–7800)
Neutrophils Relative %: 68.4 %
Platelets: 278 10*3/uL (ref 140–400)
RBC: 4.96 10*6/uL (ref 4.20–5.80)
RDW: 12.2 % (ref 11.0–15.0)
Total Lymphocyte: 19.8 %
WBC: 7.3 10*3/uL (ref 3.8–10.8)

## 2022-02-17 LAB — LIPID PANEL
Cholesterol: 143 mg/dL (ref <200)
HDL: 48 mg/dL (ref >=40)
LDL Cholesterol (Calc): 72 mg/dL (calc)
Non-HDL Cholesterol (Calc): 95 mg/dL (calc) (ref <130)
Total CHOL/HDL Ratio: 3 (calc) (ref <5.0)
Triglycerides: 152 mg/dL — ABNORMAL HIGH (ref <150)

## 2022-02-17 LAB — COMPLETE METABOLIC PANEL WITH GFR
AG Ratio: 1.8 (calc) (ref 1.0–2.5)
ALT: 25 U/L (ref 9–46)
AST: 25 U/L (ref 10–35)
Albumin: 4.3 g/dL (ref 3.6–5.1)
Alkaline phosphatase (APISO): 86 U/L (ref 35–144)
BUN: 14 mg/dL (ref 7–25)
CO2: 25 mmol/L (ref 20–32)
Calcium: 9.5 mg/dL (ref 8.6–10.3)
Chloride: 107 mmol/L (ref 98–110)
Creat: 1.16 mg/dL (ref 0.70–1.35)
Globulin: 2.4 g/dL (calc) (ref 1.9–3.7)
Glucose, Bld: 96 mg/dL (ref 65–139)
Potassium: 4.2 mmol/L (ref 3.5–5.3)
Sodium: 140 mmol/L (ref 135–146)
Total Bilirubin: 0.4 mg/dL (ref 0.2–1.2)
Total Protein: 6.7 g/dL (ref 6.1–8.1)
eGFR: 71 mL/min/{1.73_m2} (ref >=60)

## 2022-02-17 LAB — URIC ACID: Uric Acid, Serum: 5.6 mg/dL (ref 4.0–8.0)

## 2022-03-03 DIAGNOSIS — M9902 Segmental and somatic dysfunction of thoracic region: Secondary | ICD-10-CM | POA: Diagnosis not present

## 2022-03-03 DIAGNOSIS — M542 Cervicalgia: Secondary | ICD-10-CM | POA: Diagnosis not present

## 2022-03-03 DIAGNOSIS — M546 Pain in thoracic spine: Secondary | ICD-10-CM | POA: Diagnosis not present

## 2022-03-03 DIAGNOSIS — M9901 Segmental and somatic dysfunction of cervical region: Secondary | ICD-10-CM | POA: Diagnosis not present

## 2022-03-10 ENCOUNTER — Other Ambulatory Visit (HOSPITAL_COMMUNITY): Payer: Self-pay

## 2022-03-10 ENCOUNTER — Other Ambulatory Visit: Payer: Self-pay | Admitting: Nurse Practitioner

## 2022-03-10 DIAGNOSIS — F419 Anxiety disorder, unspecified: Secondary | ICD-10-CM

## 2022-03-10 MED ORDER — ALPRAZOLAM 0.25 MG PO TABS
0.2500 mg | ORAL_TABLET | Freq: Every day | ORAL | 1 refills | Status: DC | PRN
  Filled 2022-03-10: qty 20, 20d supply, fill #0
  Filled 2022-03-14: qty 20, 30d supply, fill #0
  Filled 2022-03-26: qty 20, 20d supply, fill #0
  Filled 2022-05-10: qty 20, 20d supply, fill #1

## 2022-03-10 MED ORDER — ALLOPURINOL 100 MG PO TABS
ORAL_TABLET | ORAL | 4 refills | Status: DC
  Filled 2022-03-10: qty 60, 30d supply, fill #0

## 2022-03-10 MED ORDER — FLUTICASONE FUROATE-VILANTEROL 200-25 MCG/ACT IN AEPB
INHALATION_SPRAY | RESPIRATORY_TRACT | 1 refills | Status: DC
  Filled 2022-03-10: qty 60, 30d supply, fill #0

## 2022-03-10 MED ORDER — ALLOPURINOL 100 MG PO TABS
100.0000 mg | ORAL_TABLET | Freq: Every day | ORAL | 3 refills | Status: DC
  Filled 2022-03-10 (×2): qty 90, 90d supply, fill #0
  Filled 2022-06-03: qty 90, 90d supply, fill #1
  Filled 2022-09-03: qty 90, 90d supply, fill #2
  Filled 2022-12-03: qty 90, 90d supply, fill #3

## 2022-03-10 MED ORDER — DICLOFENAC POTASSIUM 50 MG PO TABS
ORAL_TABLET | ORAL | 2 refills | Status: DC
  Filled 2022-03-10: qty 60, 30d supply, fill #0
  Filled 2022-04-09: qty 60, 30d supply, fill #1
  Filled 2022-05-05: qty 60, 30d supply, fill #2

## 2022-03-10 NOTE — Telephone Encounter (Signed)
The Allopurinol states it was discontinued.  ?To Aaron [name] to approve.  ?

## 2022-03-12 ENCOUNTER — Other Ambulatory Visit (HOSPITAL_COMMUNITY): Payer: Self-pay

## 2022-03-13 ENCOUNTER — Other Ambulatory Visit (HOSPITAL_COMMUNITY): Payer: Self-pay

## 2022-03-14 ENCOUNTER — Other Ambulatory Visit (HOSPITAL_COMMUNITY): Payer: Self-pay

## 2022-03-17 ENCOUNTER — Other Ambulatory Visit: Payer: Self-pay | Admitting: *Deleted

## 2022-03-17 DIAGNOSIS — Z95828 Presence of other vascular implants and grafts: Secondary | ICD-10-CM

## 2022-03-26 ENCOUNTER — Other Ambulatory Visit (HOSPITAL_COMMUNITY): Payer: Self-pay

## 2022-03-27 NOTE — Progress Notes (Signed)
?Office Note  ? ? ? ?CC:  follow up ?Requesting Provider:  [name], NP ? ?HPI: Aaron [name] is a 66 y.o. (1956/03/13) male who presents for surveillance follow up of EVAR in 2013 by Dr. Kellie Simmering for a 5.1 cm infrarenal aneurysm. No specific abdominal pain. He reports no significant new back pain. He does have some chronic low back pain that causes some numbness in his thighs if he stands for prolonged periods of time. No claudication pain, rest pain or tissue loss. ?  ?The pt is on a statin for cholesterol management.  ?The pt is on a daily aspirin.   Other AC: none ?The pt is on ARB for hypertension.   ?The pt is not diabetic.  ?Tobacco hx: Former, quit December 2021 ? ?Past Medical History:  ?Diagnosis Date  ? Abdominal aortic aneurysm ([place])   ? sees Dr. Einar Gip for cardiac f/u, [phone]  ? Arthritis   ? lower back  ? Asthma   ? followed by Dr. Berdie Ogren  ? Chronic airway obstruction, not elsewhere classified   ? Dyspnea   ? normal PFT April 2012  ? External hemorrhoids without mention of complication   ? GERD (gastroesophageal reflux disease)   ? Gout   ? H/O hiatal hernia   ? Hemorrhoids   ? HTN (hypertension)   ? hx of sees Dr. Graylin Shiver  ? Hyperlipidemia   ? Insomnia, unspecified   ? Other abnormal blood chemistry   ? Other dyspnea and respiratory abnormality   ? Other malaise and fatigue   ? Other specified erythematous condition(695.89)   ? Other testicular dysfunction   ? Prostatitis, unspecified   ? Rash 2011  ? left chest, biopsy 2012 Dr. Rozann Lesches, results pending  ? Skin cancer   ? Squamous cell carcinoma of skin 11/03/2014  ? right shoulder cx3 72f  ? Tobacco abuse   ? ? ?Past Surgical History:  ?Procedure Laterality Date  ? ABDOMINAL AORTIC ENDOVASCULAR STENT GRAFT  09/22/2012  ? Procedure: ABDOMINAL AORTIC ENDOVASCULAR STENT GRAFT;  Surgeon: JMal Misty MD;  Location: [facility]  Service: Vascular;  Laterality: N/A;  GORE; ultrasound guided.  ? APPENDECTOMY    ? EVALUATION  UNDER ANESTHESIA WITH HEMORRHOIDECTOMY N/A 12/31/2016  ? Procedure: EXAM UNDER ANESTHESIA WITH HEMORRHOIDECTOMY;  Surgeon: DCoralie Keens MD;  Location[facility]  Service: General;  Laterality: N/A;  ? SKIN CANCER EXCISION    ? TOE SURGERY  01/2012  ? joint of left great toe  ? TONSILLECTOMY    ? TONSILLECTOMY    ? TYMPANOPLASTY    ? WRIST SURGERY    ? left  ? ? ?Social History  ? ?Socioeconomic History  ? Marital status: Married  ?  Spouse name: Not on file  ? Number of children: Not on file  ? Years of education: Not on file  ? Highest education level: Not on file  ?Occupational History  ? Occupation: CNA  ?  Employer: [employer]  ?Tobacco Use  ? Smoking status: Former  ?  Packs/day: 0.25  ?  Years: 38.00  ?  Pack years: 9.50  ?  Types: Cigarettes  ?  Quit date: 10/31/2020  ?  Years since quitting: 1.4  ?  Passive exposure: Never  ? Smokeless tobacco: Former  ?  Types: Snuff  ?Vaping Use  ? Vaping Use: Never used  ?Substance and Sexual Activity  ? Alcohol use: Yes  ?  Alcohol/week: 0.0 standard drinks  ?  Comment: occ  ? Drug use: No  ? Sexual activity: Not on file  ?Other Topics Concern  ? Not on file  ?Social History Narrative  ? Not on file  ? ?Social Determinants of Health  ? ?Financial Resource Strain: Not on file  ?Food Insecurity: Not on file  ?Transportation Needs: Not on file  ?Physical Activity: Not on file  ?Stress: Not on file  ?Social Connections: Not on file  ?Intimate Partner Violence: Not on file  ? ? ?Family History  ?Problem Relation Age of Onset  ? Cancer Mother   ?     Breast  ? Heart attack Father 90  ? COPD Father   ? Heart disease Father   ? Osler-Weber-Rendu syndrome Brother   ? Asthma Daughter   ? Lupus Daughter   ? Diabetes Other   ?     Grandson   ? Colon cancer Neg Hx   ? Pancreatic cancer Neg Hx   ? Esophageal cancer Neg Hx   ? Stomach cancer Neg Hx   ? Rectal cancer Neg Hx   ? ? ?Current Outpatient Medications  ?Medication Sig Dispense Refill  ? acetaminophen  (TYLENOL) 500 MG tablet Take 1,000 mg by mouth every 6 (six) hours as needed.    ? albuterol (PROVENTIL HFA;VENTOLIN HFA) 108 (90 Base) MCG/ACT inhaler Inhale 2 puffs into the lungs every 6 (six) hours as needed. For cough and wheezing. 24 g 3  ? allopurinol (ZYLOPRIM) 100 MG tablet Take 1 tablet (100 mg total) by mouth daily. 90 tablet 3  ? ALPRAZolam (XANAX) 0.25 MG tablet Take 1 tablet by mouth daily as needed for anxiety (to last 30 days). 20 tablet 1  ? amLODipine (NORVASC) 5 MG tablet Take 1 tablet (5 mg total) by mouth daily. 90 tablet 1  ? aspirin EC 81 MG tablet Take 81 mg by mouth daily.    ? buPROPion (WELLBUTRIN XL) 150 MG 24 hr tablet Take 1 tablet (150 mg total) by mouth daily. 120 tablet 1  ? Colchicine 0.6 MG CAPS Take 1 capsule by mouth 2 (two) times daily as needed. 60 capsule 1  ? diclofenac (CATAFLAM) 50 MG tablet Take 1 tablet by mouth 2 times every day 60 tablet 2  ? fluticasone furoate-vilanterol (BREO ELLIPTA) 200-25 MCG/ACT AEPB Inhale 1 puff into the lungs once daily. 60 each 1  ? losartan (COZAAR) 100 MG tablet Take 1 tablet (100 mg total) by mouth daily. 90 tablet 1  ? pantoprazole (PROTONIX) 40 MG tablet TAKE 1 TABLET BY MOUTH ONCE A DAY 90 tablet 3  ? sildenafil (VIAGRA) 50 MG tablet Take one tablet by mouth as needed for erectile dysfunction. 10 tablet 1  ? simvastatin (ZOCOR) 20 MG tablet TAKE 1 TABLET BY MOUTH AT BEDTIME 90 tablet 1  ? ?No current facility-administered medications for this visit.  ? ? ?No Known Allergies ? ? ?REVIEW OF SYSTEMS:  ?'[X]'$  denotes positive finding, '[ ]'$  denotes negative finding ?Cardiac  Comments:  ?Chest pain or chest pressure:    ?Shortness of breath upon exertion:    ?Short of breath when lying flat:    ?Irregular heart rhythm:    ?    ?Vascular    ?Pain in calf, thigh, or hip brought on by ambulation:    ?Pain in feet at night that wakes you up from your sleep:     ?Blood clot in your veins:    ?Leg swelling:     ?    ?  Pulmonary    ?Oxygen at home:     ?Productive cough:     ?Wheezing:     ?    ?Neurologic    ?Sudden weakness in arms or legs:     ?Sudden numbness in arms or legs:     ?Sudden onset of difficulty speaking or slurred speech:    ?Temporary loss of vision in one eye:     ?Problems with dizziness:     ?    ?Gastrointestinal    ?Blood in stool:     ?Vomited blood:     ?    ?Genitourinary    ?Burning when urinating:     ?Blood in urine:    ?    ?Psychiatric    ?Major depression:     ?    ?Hematologic    ?Bleeding problems:    ?Problems with blood clotting too easily:    ?    ?Skin    ?Rashes or ulcers:    ?    ?Constitutional    ?Fever or chills:    ? ? ?PHYSICAL EXAMINATION: ? ?Vitals:  ? 03/31/22 0844  ?BP: 137/88  ?Pulse: (!) 58  ?Resp: 20  ?Temp: 98.6 ?F (37 ?C)  ?TempSrc: Temporal  ?SpO2: 96%  ?Weight: 229 lb 14.4 oz (104.3 kg)  ?Height: '6\' 1"'$  (1.854 m)  ? ? ?General:  WDWN in NAD; vital signs documented above ?Gait: Normal ?HENT: WNL, normocephalic ?Pulmonary: normal non-labored breathing , without wheezing ?Cardiac: regular HR, without  Murmurs without carotid bruit ?Abdomen: distended soft, NT, no masses. He does have some diastasis recti ?Vascular Exam/Pulses: ? Right Left  ?Radial 2+ (normal) 2+ (normal)  ?Femoral 2+ (normal) 2+ (normal)  ?Popliteal Not palpable Not palpable  ?DP 2+ (normal) 2+ (normal)  ?PT 1+ (weak) 1+ (weak)  ? ?Extremities: without ischemic changes, without Gangrene , without cellulitis; without open wounds;  ?Musculoskeletal: no muscle wasting or atrophy  ?Neurologic: A&O X 3;  No focal weakness or paresthesias are detected ?Psychiatric:  The pt has Normal affect. ? ? ?Non-Invasive Vascular Imaging:   ?Abdominal Aorta Findings:  ?+--------+-------+----------+----------+--------+--------+--------+  ?LocationAP (cm)Trans (cm)PSV (cm/s)WaveformThrombusComments  ?+--------+-------+----------+----------+--------+--------+--------+  ?Proximal2.98   3.49      89                                   ?+--------+-------+----------+----------+--------+--------+--------+  ? ?Endovascular Aortic Repair (EVAR):  ?+----------+----------------+-------------------+-------------------+  ?          Diameter AP (cm)Diameter Trans (cm)Velocities (cm/sec)

## 2022-03-31 ENCOUNTER — Other Ambulatory Visit (HOSPITAL_COMMUNITY): Payer: Self-pay

## 2022-03-31 ENCOUNTER — Ambulatory Visit (HOSPITAL_COMMUNITY)
Admission: RE | Admit: 2022-03-31 | Discharge: 2022-03-31 | Disposition: A | Discharge status: Home / Self Care | Payer: 59 | Source: Ambulatory Visit | Attending: Surgery | Admitting: Surgery

## 2022-03-31 ENCOUNTER — Ambulatory Visit (INDEPENDENT_AMBULATORY_CARE_PROVIDER_SITE_OTHER): Payer: 59 | Admitting: Physician Assistant

## 2022-03-31 ENCOUNTER — Telehealth: Payer: Self-pay | Admitting: *Deleted

## 2022-03-31 VITALS — BP 137/88 | HR 58 | Temp 98.6°F | Resp 20 | Ht 73.0 in | Wt 229.9 lb

## 2022-03-31 DIAGNOSIS — Z95828 Presence of other vascular implants and grafts: Secondary | ICD-10-CM

## 2022-03-31 MED ORDER — FLUTICASONE-SALMETEROL 115-21 MCG/ACT IN AERO
2.0000 | INHALATION_SPRAY | Freq: Two times a day (BID) | RESPIRATORY_TRACT | 12 refills | Status: DC
  Filled 2022-03-31: qty 12, 30d supply, fill #0

## 2022-03-31 NOTE — Telephone Encounter (Signed)
Patient called and stated that he went to the pharmacy to pick up his Breo inhaler and the pharmacist asked him why he was on it.  ? ?Stated that the [place] cost $73 and Advair cost $5 ? ?Patient is requesting to be switched back to Advair inhaler instead.  ? ?Would like a Rx [facility] to [facility].  ?Please Advise.  ?

## 2022-03-31 NOTE — Telephone Encounter (Signed)
Breo d/c'd and advair sent to [facility]- to take 1 puff twice daily for asthma.  ?

## 2022-04-01 ENCOUNTER — Other Ambulatory Visit: Payer: Self-pay | Admitting: Nurse Practitioner

## 2022-04-01 ENCOUNTER — Other Ambulatory Visit (HOSPITAL_COMMUNITY): Payer: Self-pay

## 2022-04-01 MED ORDER — FLUTICASONE-SALMETEROL 100-50 MCG/ACT IN AEPB
1.0000 | INHALATION_SPRAY | Freq: Two times a day (BID) | RESPIRATORY_TRACT | 2 refills | Status: DC
Start: 2022-04-01 — End: 2022-07-17
  Filled 2022-04-01: qty 60, 30d supply, fill #0
  Filled 2022-05-05: qty 60, 30d supply, fill #1
  Filled 2022-06-03: qty 60, 30d supply, fill #2

## 2022-04-02 ENCOUNTER — Other Ambulatory Visit (HOSPITAL_COMMUNITY): Payer: Self-pay

## 2022-04-09 ENCOUNTER — Other Ambulatory Visit (HOSPITAL_COMMUNITY): Payer: Self-pay

## 2022-04-14 ENCOUNTER — Other Ambulatory Visit: Payer: Self-pay | Admitting: Nurse Practitioner

## 2022-04-14 ENCOUNTER — Ambulatory Visit: Payer: 59 | Admitting: Nurse Practitioner

## 2022-04-14 ENCOUNTER — Other Ambulatory Visit (HOSPITAL_COMMUNITY): Payer: Self-pay

## 2022-04-14 ENCOUNTER — Encounter: Payer: Self-pay | Admitting: Nurse Practitioner

## 2022-04-14 VITALS — BP 138/94 | HR 67 | Temp 97.3°F | Ht 73.5 in | Wt 238.0 lb

## 2022-04-14 DIAGNOSIS — F419 Anxiety disorder, unspecified: Secondary | ICD-10-CM

## 2022-04-14 DIAGNOSIS — I10 Essential (primary) hypertension: Secondary | ICD-10-CM

## 2022-04-14 DIAGNOSIS — Z0000 Encounter for general adult medical examination without abnormal findings: Secondary | ICD-10-CM | POA: Diagnosis not present

## 2022-04-14 DIAGNOSIS — J4540 Moderate persistent asthma, uncomplicated: Secondary | ICD-10-CM

## 2022-04-14 MED ORDER — ALBUTEROL SULFATE HFA 108 (90 BASE) MCG/ACT IN AERS
2.0000 | INHALATION_SPRAY | Freq: Four times a day (QID) | RESPIRATORY_TRACT | 3 refills | Status: DC | PRN
  Filled 2022-04-14: qty 25.5, 25d supply, fill #0
  Filled 2022-06-03: qty 25.5, 75d supply, fill #1
  Filled 2022-08-28: qty 20.1, 75d supply, fill #2
  Filled 2023-01-11: qty 20.1, 75d supply, fill #3
  Filled 2023-04-01: qty 20.1, 75d supply, fill #4

## 2022-04-14 MED ORDER — AMLODIPINE BESYLATE 10 MG PO TABS
10.0000 mg | ORAL_TABLET | Freq: Every day | ORAL | 1 refills | Status: DC
  Filled 2022-04-14: qty 90, 90d supply, fill #0
  Filled 2022-07-11: qty 90, 90d supply, fill #1

## 2022-04-14 NOTE — Patient Instructions (Signed)
Increase norvasc to 10 mg daily  Continue to check blood pressue- after medication a few times a week

## 2022-04-14 NOTE — Progress Notes (Signed)
Provider: [name], NP  [name], NP as PCP - General (Nurse Practitioner) [name], [name], NP (Inactive) as Nurse Practitioner (Vascular Surgery) [name], MD as Consulting Physician (General Surgery) Brand Males, MD as Consulting Physician (Pulmonary Disease) [name], MD as Consulting Physician (Cardiology) [name], PA-C as Physician Assistant (Dermatology)  Extended Emergency Contact Information Primary Emergency Contact: [name],[name] Address: [address]          [place], [place] [address] [name] Phone: [phone] Mobile Phone: [phone] Relation: Spouse No Known Allergies Code Status: full  Goals of Care: Advanced Directive information    04/14/2022    8:12 AM  Advanced Directives  Does Patient Have a Medical Advance Directive? No  Does patient want to make changes to medical advance directive? Yes (MAU/Ambulatory/Procedural Areas - Information given)     Chief Complaint  Patient presents with   Annual Exam    Yearly physical. Discuss need for additional covid boosters or post pone if patient refuses. Patient is still on Breo, will begin Advair when complete with Breo. Discuss dose of advair.     HPI: Patient is a 66 y.o. male seen in today for an wellness exam at [facility].   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  He is going to dentist this morning.  Wife think his hearing is going bad.   Needs refill on amlodipine.         04/14/2022    8:12 AM 09/30/2021    8:25 AM 04/08/2021    9:39 AM 10/23/2020    8:36 AM 09/21/2020    2:19 PM  Depression screen PHQ 2/9  Decreased Interest 0 0 0 0 0  Down, Depressed, Hopeless 0 0 0 0 0  PHQ - 2 Score 0 0 0 0 0       07/19/2021   12:46 PM 07/22/2021    2:29 PM 09/30/2021    8:24 AM 01/03/2022    8:07 AM 04/14/2022    8:11 AM  Fall Risk  Falls in the past year? 0  1 1 0  Was there an injury  with Fall?   0 0 0  Fall Risk Category Calculator   1 2 0  Fall Risk Category   Low Moderate Low  Patient Fall Risk Level Low fall risk Low fall risk Low fall risk Moderate fall risk Low fall risk  Patient at Risk for Falls Due to   No Fall Risks No Fall Risks;History of fall(s) No Fall Risks  Fall risk Follow up Falls evaluation completed  Falls evaluation completed Falls evaluation completed Falls evaluation completed       View : No data to display.           Health Maintenance  Topic Date Due   COVID-19 Vaccine (2 - Janssen risk series) 09/01/2020   INFLUENZA VACCINE  06/24/2022   TETANUS/TDAP  12/07/2023   COLONOSCOPY (Pts 45-43yr Insurance coverage will need to be confirmed)  10/03/2026   Zoster Vaccines- Shingrix  Completed   HPV VACCINES  Aged Out   Hepatitis C Screening  Discontinued   HIV Screening  Discontinued    Past Medical History:  Diagnosis Date   Abdominal aortic aneurysm ([place]    sees Dr. GEinar Gipfor cardiac f/u, [phone]  Arthritis    lower back   Asthma    followed by Dr. RBerdie Ogren  Chronic airway obstruction,  not elsewhere classified    Dyspnea    normal PFT April 2012   External hemorrhoids without mention of complication    GERD (gastroesophageal reflux disease)    Gout    H/O hiatal hernia    Hemorrhoids    HTN (hypertension)    hx of sees Dr. Graylin Shiver   Hyperlipidemia    Insomnia, unspecified    Other abnormal blood chemistry    Other dyspnea and respiratory abnormality    Other malaise and fatigue    Other specified erythematous condition(695.89)    Other testicular dysfunction    Prostatitis, unspecified    Rash 2011   left chest, biopsy 2012 Dr. Rozann Lesches, results pending   Skin cancer    Squamous cell carcinoma of skin 11/03/2014   right shoulder cx3 31f   Tobacco abuse     Past Surgical History:  Procedure Laterality Date   ABDOMINAL AORTIC ENDOVASCULAR STENT GRAFT  09/22/2012   Procedure: ABDOMINAL AORTIC  ENDOVASCULAR STENT GRAFT;  Surgeon: JMal Misty MD;  Location[facility]  Service: Vascular;  Laterality: N/A;  GORE; ultrasound guided.   APPENDECTOMY     EVALUATION UNDER ANESTHESIA WITH HEMORRHOIDECTOMY N/A 12/31/2016   Procedure: EXAM UNDER ANESTHESIA WITH HEMORRHOIDECTOMY;  Surgeon: DCoralie Keens MD;  Location: [facility]  Service: General;  Laterality: N/A;   SKIN CANCER EXCISION     TOE SURGERY  01/2012   joint of left great toe   TONSILLECTOMY     TONSILLECTOMY     TYMPANOPLASTY     WRIST SURGERY     left    Social History   Socioeconomic History   Marital status: Married    Spouse name: Not on file   Number of children: Not on file   Years of education: Not on file   Highest education level: Not on file  Occupational History   Occupation: CNA    Employer: [employer]  Tobacco Use   Smoking status: Former    Packs/day: 0.25    Years: 38.00    Pack years: 9.50    Types: Cigarettes    Quit date: 10/31/2020    Years since quitting: 1.4    Passive exposure: Never   Smokeless tobacco: Former    Types: Snuff  Vaping Use   Vaping Use: Never used  Substance and Sexual Activity   Alcohol use: Yes    Alcohol/week: 0.0 standard drinks    Comment: occ   Drug use: No   Sexual activity: Not on file  Other Topics Concern   Not on file  Social History Narrative   Not on file   Social Determinants of Health   Financial Resource Strain: Not on file  Food Insecurity: Not on file  Transportation Needs: Not on file  Physical Activity: Not on file  Stress: Not on file  Social Connections: Not on file    Family History  Problem Relation Age of Onset   Cancer Mother        Breast   Heart attack Father 74  COPD Father    Heart disease Father    Osler-Weber-Rendu syndrome Brother    Asthma Daughter    Lupus Daughter    Diabetes Other        Grandson    Colon cancer Neg Hx    Pancreatic cancer Neg Hx    Esophageal cancer Neg Hx    Stomach  cancer Neg Hx    Rectal cancer Neg Hx  Review of Systems:  Review of Systems  Constitutional:  Negative for chills, fever and weight loss.  HENT:  Negative for tinnitus.   Respiratory:  Negative for cough, sputum production and shortness of breath.   Cardiovascular:  Negative for chest pain, palpitations and leg swelling.  Gastrointestinal:  Negative for abdominal pain, constipation, diarrhea and heartburn.  Genitourinary:  Negative for dysuria, frequency and urgency.  Musculoskeletal:  Negative for back pain, falls, joint pain and myalgias.  Skin: Negative.   Neurological:  Negative for dizziness and headaches.  Psychiatric/Behavioral:  Negative for depression and memory loss. The patient does not have insomnia.     Allergies as of 04/14/2022   No Known Allergies      Medication List        Accurate as of Apr 14, 2022  8:18 AM. If you have any questions, ask your nurse or doctor.          acetaminophen 500 MG tablet Commonly known as: TYLENOL Take 1,000 mg by mouth every 6 (six) hours as needed.   Advair Diskus 100-50 MCG/ACT Aepb Generic drug: fluticasone-salmeterol Inhale 1 puff into the lungs 2 (two) times daily.  *REPLACES BREO ELLIPTA*   albuterol 108 (90 Base) MCG/ACT inhaler Commonly known as: VENTOLIN HFA Inhale 2 puffs into the lungs every 6 (six) hours as needed. For cough and wheezing.   allopurinol 100 MG tablet Commonly known as: ZYLOPRIM Take 1 tablet (100 mg total) by mouth daily.   ALPRAZolam 0.25 MG tablet Commonly known as: XANAX Take 1 tablet by mouth daily as needed for anxiety (to last 30 days).   amLODipine 5 MG tablet Commonly known as: NORVASC Take 1 tablet (5 mg total) by mouth daily.   aspirin EC 81 MG tablet Take 81 mg by mouth daily.   buPROPion 150 MG 24 hr tablet Commonly known as: WELLBUTRIN XL Take 1 tablet (150 mg total) by mouth daily.   Colchicine 0.6 MG Caps Take 1 capsule by mouth 2 (two) times daily as needed.    diclofenac 50 MG tablet Commonly known as: CATAFLAM Take 1 tablet by mouth 2 times every day   losartan 100 MG tablet Commonly known as: COZAAR Take 1 tablet (100 mg total) by mouth daily.   pantoprazole 40 MG tablet Commonly known as: PROTONIX TAKE 1 TABLET BY MOUTH ONCE A DAY   sildenafil 50 MG tablet Commonly known as: VIAGRA Take one tablet by mouth as needed for erectile dysfunction.   simvastatin 20 MG tablet Commonly known as: ZOCOR TAKE 1 TABLET BY MOUTH AT BEDTIME          Physical Exam: Vitals:   04/14/22 0808  BP: (!) 138/94  Pulse: 67  Temp: (!) 97.3 F (36.3 C)  TempSrc: Temporal  SpO2: 98%  Weight: 238 lb (108 kg)  Height: 6' 1.5" (1.867 m)   Body mass index is 30.97 kg/m. Wt Readings from Last 3 Encounters:  04/14/22 238 lb (108 kg)  03/31/22 229 lb 14.4 oz (104.3 kg)  01/03/22 232 lb 9.6 oz (105.5 kg)    Physical Exam Constitutional:      General: He is not in acute distress.    Appearance: He is well-developed. He is not diaphoretic.  HENT:     Head: Normocephalic and atraumatic.     Right Ear: External ear normal.     Left Ear: External ear normal.     Mouth/Throat:     Pharynx: No oropharyngeal exudate.  Eyes:  Conjunctiva/sclera: Conjunctivae normal.     Pupils: Pupils are equal, round, and reactive to light.  Cardiovascular:     Rate and Rhythm: Normal rate and regular rhythm.     Heart sounds: Normal heart sounds.  Pulmonary:     Effort: Pulmonary effort is normal.     Breath sounds: Normal breath sounds.  Abdominal:     General: Bowel sounds are normal.     Palpations: Abdomen is soft.  Musculoskeletal:        General: No tenderness.     Cervical back: Normal range of motion and neck supple.     Right lower leg: No edema.     Left lower leg: No edema.  Skin:    General: Skin is warm and dry.  Neurological:     Mental Status: He is alert and oriented to person, place, and time.  Psychiatric:        Mood and  Affect: Mood normal.        Behavior: Behavior normal.    Labs reviewed: Basic Metabolic Panel: Recent Labs    02/17/22 0911  NA 140  K 4.2  CL 107  CO2 25  GLUCOSE 96  BUN 14  CREATININE 1.16  CALCIUM 9.5   Liver Function Tests: Recent Labs    02/17/22 0911  AST 25  ALT 25  BILITOT 0.4  PROT 6.7   No results for input(s): LIPASE, AMYLASE in the last 8760 hours. No results for input(s): AMMONIA in the last 8760 hours. CBC: Recent Labs    02/17/22 0911  WBC 7.3  NEUTROABS 4,993  HGB 15.5  HCT 45.6  MCV 91.9  PLT 278   Lipid Panel: Recent Labs    02/17/22 0911  CHOL 143  HDL 48  LDLCALC 72  TRIG 152*  CHOLHDL 3.0   Lab Results  Component Value Date   HGBA1C 5.4 09/18/2020    Procedures: VAS [place] EVAR DUPLEX  Result Date: 03/31/2022 Endovascular Aortic Repair Study (EVAR) Patient Name:  Aaron [name] [facility]  Date of Exam:   03/31/2022 Medical Rec #: [id number]             Accession #:    [id number] Date of Birth: 01-12-1956            Patient Gender: M Patient Age:   29 years Exam Location:  [facility] Procedure:      VAS [place] EVAR DUPLEX Referring Phys: [employer] COLLINS --------------------------------------------------------------------------------  Indications: Follow up exam for EVAR. Risk Factors: Hypertension, hyperlipidemia, past history of smoking.  Comparison Study: Prior duplex 03/04/2021 showed a maximum residual sac diameter                   of 4.3 cm with no evidence of endoleak. Performing Technologist: Delorise Shiner RVT  Examination Guidelines: A complete evaluation includes B-mode imaging, spectral Doppler, color Doppler, and power Doppler as needed of all accessible portions of each vessel. Bilateral testing is considered an integral part of a complete examination. Limited examinations for reoccurring indications may be performed as noted.  Abdominal Aorta Findings: +--------+-------+----------+----------+--------+--------+--------+  LocationAP (cm)Trans (cm)PSV (cm/s)WaveformThrombusComments +--------+-------+----------+----------+--------+--------+--------+ Proximal2.98   3.49      89                                 +--------+-------+----------+----------+--------+--------+--------+ Endovascular Aortic Repair (EVAR): +----------+----------------+-------------------+-------------------+           Diameter AP (cm)Diameter  Trans (cm)Velocities (cm/sec) +----------+----------------+-------------------+-------------------+ Aorta     4.19            4.22               30                  +----------+----------------+-------------------+-------------------+ Right Limb1.36            1.47               63                  +----------+----------------+-------------------+-------------------+ Left Limb 1.68            2.01               82                  +----------+----------------+-------------------+-------------------+  Summary: Abdominal Aorta: Patent endovascular aneurysm repair with no evidence of endoleak.  *See table(s) above for measurements and observations.  Electronically signed by Harold Barban MD on 03/31/2022 at 11:41:35 AM.    Final     Assessment/Plan 1. Preventative health care -completed today -will get PSA on next labs, no symptoms - The patient was counseled regarding the appropriate use of alcohol, regular self-examination of the breasts on a monthly basis, prevention of dental and periodontal disease, diet, regular sustained exercise for at least 30 minutes 5 times per week, testicular self-examination on a monthly basis,smoking cessation, tobacco use,  and recommended schedule for GI hemoccult testing, colonoscopy, cholesterol, thyroid and diabetes screening.  2. Essential hypertension -continues to not be controlled. Will increase norvasc.  - amLODipine (NORVASC) 10 MG tablet; Take 1 tablet (10 mg total) by mouth daily.  Dispense: 90 tablet; Refill: 1  3. Anxiety Ongoing,  discussed lifestyle modifications, if continues to not be controlled will follow up sooner.   Follow up in 3 months for routine follow up.  Carlos American. [place], [facility] [phone]

## 2022-05-05 ENCOUNTER — Other Ambulatory Visit: Payer: Self-pay | Admitting: Nurse Practitioner

## 2022-05-05 ENCOUNTER — Other Ambulatory Visit (HOSPITAL_COMMUNITY): Payer: Self-pay

## 2022-05-05 DIAGNOSIS — N529 Male erectile dysfunction, unspecified: Secondary | ICD-10-CM

## 2022-05-05 DIAGNOSIS — I10 Essential (primary) hypertension: Secondary | ICD-10-CM

## 2022-05-05 MED ORDER — SILDENAFIL CITRATE 50 MG PO TABS
ORAL_TABLET | ORAL | 1 refills | Status: DC
  Filled 2022-05-05: qty 10, 10d supply, fill #0
  Filled 2022-06-03: qty 10, 50d supply, fill #1

## 2022-05-05 MED ORDER — LOSARTAN POTASSIUM 100 MG PO TABS
100.0000 mg | ORAL_TABLET | Freq: Every day | ORAL | 2 refills | Status: DC
  Filled 2022-05-05: qty 90, 90d supply, fill #0
  Filled 2022-08-04: qty 90, 90d supply, fill #1
  Filled 2022-10-28: qty 90, 90d supply, fill #2

## 2022-05-12 ENCOUNTER — Other Ambulatory Visit (HOSPITAL_COMMUNITY): Payer: Self-pay

## 2022-06-03 ENCOUNTER — Other Ambulatory Visit: Payer: Self-pay | Admitting: Nurse Practitioner

## 2022-06-03 ENCOUNTER — Other Ambulatory Visit (HOSPITAL_COMMUNITY): Payer: Self-pay

## 2022-06-03 DIAGNOSIS — E782 Mixed hyperlipidemia: Secondary | ICD-10-CM

## 2022-06-03 MED ORDER — DICLOFENAC POTASSIUM 50 MG PO TABS
ORAL_TABLET | ORAL | 2 refills | Status: DC
  Filled 2022-06-03: qty 60, 30d supply, fill #0
  Filled 2022-07-08: qty 60, 30d supply, fill #1
  Filled 2022-08-04: qty 60, 30d supply, fill #2

## 2022-06-03 MED ORDER — ASPIRIN EC 81 MG PO TBEC
81.0000 mg | DELAYED_RELEASE_TABLET | Freq: Every day | ORAL | 5 refills | Status: DC
  Filled 2022-06-03: qty 30, 30d supply, fill #0

## 2022-06-03 MED ORDER — SIMVASTATIN 20 MG PO TABS
ORAL_TABLET | Freq: Every day | ORAL | 3 refills | Status: DC
  Filled 2022-06-03: qty 90, 90d supply, fill #0
  Filled 2022-09-08: qty 90, 90d supply, fill #1
  Filled 2022-12-03: qty 90, 90d supply, fill #2
  Filled 2023-03-06 – 2023-03-07 (×2): qty 90, 90d supply, fill #3

## 2022-06-03 MED ORDER — ACETAMINOPHEN 500 MG PO TABS
1000.0000 mg | ORAL_TABLET | Freq: Four times a day (QID) | ORAL | 5 refills | Status: AC | PRN
Start: 2022-06-03 — End: ?
  Filled 2022-06-03: qty 30, 4d supply, fill #0

## 2022-06-19 ENCOUNTER — Other Ambulatory Visit: Payer: Self-pay | Admitting: Nurse Practitioner

## 2022-06-19 DIAGNOSIS — F419 Anxiety disorder, unspecified: Secondary | ICD-10-CM

## 2022-06-20 ENCOUNTER — Other Ambulatory Visit (HOSPITAL_COMMUNITY): Payer: Self-pay

## 2022-06-20 MED ORDER — ALPRAZOLAM 0.25 MG PO TABS
0.2500 mg | ORAL_TABLET | Freq: Every day | ORAL | 1 refills | Status: DC | PRN
  Filled 2022-06-20: qty 20, 30d supply, fill #0
  Filled 2022-07-17 (×2): qty 20, 30d supply, fill #1

## 2022-06-20 NOTE — Telephone Encounter (Signed)
Pharmacy requested refill.  Epic LR: 03/10/2022 Contract Date: 01/02/2022 Pended Rx and sent to [facility] for approval.

## 2022-07-08 ENCOUNTER — Other Ambulatory Visit (HOSPITAL_COMMUNITY): Payer: Self-pay

## 2022-07-11 ENCOUNTER — Other Ambulatory Visit: Payer: Self-pay | Admitting: Nurse Practitioner

## 2022-07-11 ENCOUNTER — Other Ambulatory Visit (HOSPITAL_COMMUNITY): Payer: Self-pay

## 2022-07-11 DIAGNOSIS — N529 Male erectile dysfunction, unspecified: Secondary | ICD-10-CM

## 2022-07-11 MED ORDER — SILDENAFIL CITRATE 50 MG PO TABS
ORAL_TABLET | ORAL | 1 refills | Status: DC
  Filled 2022-07-11: qty 10, 30d supply, fill #0
  Filled 2022-08-15: qty 6, 30d supply, fill #1
  Filled 2022-09-17: qty 4, 30d supply, fill #2

## 2022-07-14 ENCOUNTER — Ambulatory Visit: Payer: 59 | Admitting: Nurse Practitioner

## 2022-07-17 ENCOUNTER — Other Ambulatory Visit: Payer: Self-pay | Admitting: Nurse Practitioner

## 2022-07-17 ENCOUNTER — Other Ambulatory Visit (HOSPITAL_COMMUNITY): Payer: Self-pay

## 2022-07-17 ENCOUNTER — Encounter: Payer: Self-pay | Admitting: Nurse Practitioner

## 2022-07-17 MED ORDER — FLUTICASONE-SALMETEROL 100-50 MCG/ACT IN AEPB
1.0000 | INHALATION_SPRAY | Freq: Two times a day (BID) | RESPIRATORY_TRACT | 5 refills | Status: DC
Start: 2022-07-17 — End: 2023-01-19
  Filled 2022-07-17: qty 60, 30d supply, fill #0
  Filled 2022-08-15: qty 60, 30d supply, fill #1
  Filled 2022-09-17: qty 60, 30d supply, fill #2
  Filled 2022-10-28: qty 60, 30d supply, fill #3
  Filled 2022-11-27 (×2): qty 60, 30d supply, fill #4
  Filled 2022-12-25: qty 60, 30d supply, fill #5

## 2022-07-17 NOTE — Progress Notes (Signed)
Careteam: Patient Care Team: Aaron Chandler, NP as PCP - General (Nurse Practitioner) Aaron [name], Aaron Leyden, NP (Inactive) as Nurse Practitioner (Vascular Surgery) Aaron Keens, MD as Consulting Physician (General Surgery) Aaron Males, MD as Consulting Physician (Pulmonary Disease) Aaron Prows, MD as Consulting Physician (Cardiology) Aaron Danes, PA-C as Physician Assistant (Dermatology)  PLACE OF SERVICE:  [place] Directive information Does Patient Have a Medical Advance Directive?: No, Would patient like information on creating a medical advance directive?: Yes (MAU/Ambulatory/Procedural Areas - Information given) (Already has paperwork)  No Known Allergies  Chief Complaint  Patient presents with   Medical Management of Chronic Issues    3 month follow up. Discuss need for Covid booster and flu vaccine.NCIR verified. Bilateral ankle swelling-? Related to gout. Patient is not fasting today, patient had coffee with cream and sugar.      HPI: Patient is a 66 y.o. male routine follow up.   Blood pressure has gone down, he is following this at home.   Occasionally will have ankle swelling, no pain worse in the afternoon but improves by morning.   Occasional pain in toe, overall gout controlled.   Anxiety controlled overall Estate finally closed after 3.5 years, stress level still up but better.  Working 2-3 days a week, going to [employer].  He is using xanax occasionally   Asthma- controlled   Vascular following AAA- overall stable.   Review of Systems:  Review of Systems  Constitutional:  Negative for chills, fever and weight loss.  HENT:  Negative for tinnitus.   Respiratory:  Negative for cough, sputum production and shortness of breath.   Cardiovascular:  Negative for chest pain, palpitations and leg swelling.  Gastrointestinal:  Negative for abdominal pain, constipation, diarrhea and heartburn.  Genitourinary:  Negative for dysuria,  frequency and urgency.  Musculoskeletal:  Positive for joint pain. Negative for back pain, falls and myalgias.  Skin: Negative.   Neurological:  Negative for dizziness and headaches.  Psychiatric/Behavioral:  Negative for depression and memory loss. The patient is nervous/anxious. The patient does not have insomnia.     Past Medical History:  Diagnosis Date   Abdominal aortic aneurysm [facility])    sees Dr. Einar Gip for cardiac f/u, [phone]   Arthritis    lower back   Asthma    followed by Dr. Berdie Ogren   Chronic airway obstruction, not elsewhere classified    Dyspnea    normal PFT April 2012   External hemorrhoids without mention of complication    GERD (gastroesophageal reflux disease)    Gout    H/O hiatal hernia    Hemorrhoids    HTN (hypertension)    hx of sees Dr. Graylin Shiver   Hyperlipidemia    Insomnia, unspecified    Other abnormal blood chemistry    Other dyspnea and respiratory abnormality    Other malaise and fatigue    Other specified erythematous condition(695.89)    Other testicular dysfunction    Prostatitis, unspecified    Rash 2011   left chest, biopsy 2012 Dr. Rozann Lesches, results pending   Skin cancer    Squamous cell carcinoma of skin 11/03/2014   right shoulder cx3 35f   Tobacco abuse    Past Surgical History:  Procedure Laterality Date   ABDOMINAL AORTIC ENDOVASCULAR STENT GRAFT  09/22/2012   Procedure: ABDOMINAL AORTIC ENDOVASCULAR STENT GRAFT;  Surgeon: JMal Misty MD;  Location: [facility]  Service: Vascular;  Laterality: N/A;  GORE; ultrasound guided.  APPENDECTOMY     EVALUATION UNDER ANESTHESIA WITH HEMORRHOIDECTOMY N/A 12/31/2016   Procedure: EXAM UNDER ANESTHESIA WITH HEMORRHOIDECTOMY;  Surgeon: Aaron Keens, MD;  Location: [place];  Service: General;  Laterality: N/A;   SKIN CANCER EXCISION     TOE SURGERY  01/2012   joint of left great toe   TONSILLECTOMY     TONSILLECTOMY     TYMPANOPLASTY     WRIST SURGERY      left   Social History:   reports that he quit smoking about 20 months ago. His smoking use included cigarettes. He has a 9.50 pack-year smoking history. He has never been exposed to tobacco smoke. He quit smokeless tobacco use about 10 years ago.  His smokeless tobacco use included snuff. He reports current alcohol use. He reports that he does not use drugs.  Family History  Problem Relation Age of Onset   Cancer Mother        Breast   Heart attack Father 24   COPD Father    Heart disease Father    Osler-Weber-Rendu syndrome Brother    Asthma Daughter    Lupus Daughter    Diabetes Other        Grandson    Colon cancer Neg Hx    Pancreatic cancer Neg Hx    Esophageal cancer Neg Hx    Stomach cancer Neg Hx    Rectal cancer Neg Hx     Medications: Patient's Medications  New Prescriptions   No medications on file  Previous Medications   ACETAMINOPHEN (TYLENOL) 500 MG TABLET    Take 2 tablets (1,000 mg total) by mouth every 6 (six) hours as needed.   ALBUTEROL (VENTOLIN HFA) 108 (90 BASE) MCG/ACT INHALER    Inhale 2 puffs into the lungs every 6 hours as needed for cough and wheezing.   ALLOPURINOL (ZYLOPRIM) 100 MG TABLET    Take 1 tablet (100 mg total) by mouth daily.   ALPRAZOLAM (XANAX) 0.25 MG TABLET    Take 1 tablet by mouth daily as needed for anxiety (to last 30 days).   AMLODIPINE (NORVASC) 10 MG TABLET    Take 1 tablet (10 mg total) by mouth daily.   ASPIRIN EC 81 MG TABLET    Take 1 tablet (81 mg total) by mouth daily.   BUPROPION (WELLBUTRIN XL) 150 MG 24 HR TABLET    Take 1 tablet (150 mg total) by mouth daily.   COLCHICINE 0.6 MG CAPS    Take 1 capsule by mouth 2 (two) times daily as needed.   DICLOFENAC (CATAFLAM) 50 MG TABLET    Take 1 tablet by mouth 2 times every day   FLUTICASONE-SALMETEROL (ADVAIR DISKUS) 100-50 MCG/ACT AEPB    Inhale 1 puff into the lungs 2 (two) times daily.  *REPLACES BREO ELLIPTA*   LOSARTAN (COZAAR) 100 MG TABLET    Take 1 tablet (100 mg  total) by mouth daily.   PANTOPRAZOLE (PROTONIX) 40 MG TABLET    TAKE 1 TABLET BY MOUTH ONCE A DAY   SILDENAFIL (VIAGRA) 50 MG TABLET    Take one tablet by mouth as needed for erectile dysfunction.   SIMVASTATIN (ZOCOR) 20 MG TABLET    TAKE 1 TABLET BY MOUTH AT BEDTIME  Modified Medications   No medications on file  Discontinued Medications   No medications on file    Physical Exam:  Vitals:   07/18/22 0800  BP: 132/84  Pulse: 79  Temp: (!) 97.1 F (  36.2 C)  TempSrc: Temporal  SpO2: 97%  Weight: 232 lb (105.2 kg)  Height: 6' 1.2" (1.859 m)   Body mass index is 30.44 kg/m. Wt Readings from Last 3 Encounters:  07/18/22 232 lb (105.2 kg)  04/14/22 238 lb (108 kg)  03/31/22 229 lb 14.4 oz (104.3 kg)    Physical Exam Constitutional:      General: He is not in acute distress.    Appearance: He is well-developed. He is not diaphoretic.  HENT:     Head: Normocephalic and atraumatic.     Right Ear: External ear normal.     Left Ear: External ear normal.     Mouth/Throat:     Pharynx: No oropharyngeal exudate.  Eyes:     Conjunctiva/sclera: Conjunctivae normal.     Pupils: Pupils are equal, round, and reactive to light.  Cardiovascular:     Rate and Rhythm: Normal rate and regular rhythm.     Heart sounds: Normal heart sounds.  Pulmonary:     Effort: Pulmonary effort is normal.     Breath sounds: Normal breath sounds.  Abdominal:     General: Bowel sounds are normal.     Palpations: Abdomen is soft.  Musculoskeletal:        General: No tenderness.     Cervical back: Normal range of motion and neck supple.     Right lower leg: No edema.     Left lower leg: No edema.  Skin:    General: Skin is warm and dry.  Neurological:     Mental Status: He is alert and oriented to person, place, and time.    Labs reviewed: Basic Metabolic Panel: Recent Labs    02/17/22 0911  NA 140  K 4.2  CL 107  CO2 25  GLUCOSE 96  BUN 14  CREATININE 1.16  CALCIUM 9.5   Liver  Function Tests: Recent Labs    02/17/22 0911  AST 25  ALT 25  BILITOT 0.4  PROT 6.7   No results for input(s): "LIPASE", "AMYLASE" in the last 8760 hours. No results for input(s): "AMMONIA" in the last 8760 hours. CBC: Recent Labs    02/17/22 0911  WBC 7.3  NEUTROABS 4,993  HGB 15.5  HCT 45.6  MCV 91.9  PLT 278   Lipid Panel: Recent Labs    02/17/22 0911  CHOL 143  HDL 48  LDLCALC 72  TRIG 152*  CHOLHDL 3.0   TSH: No results for input(s): "TSH" in the last 8760 hours. A1C: Lab Results  Component Value Date   HGBA1C 5.4 09/18/2020     Assessment/Plan 1. Essential hypertension -Blood pressure well controlled, goal bp <140/90 Continue current medications and dietary modifications follow metabolic panel - COMPLETE METABOLIC PANEL WITH GFR - CBC with Differential/Platelet - COMPLETE METABOLIC PANEL WITH GFR; Future - CBC with Differential/Platelet; Future  2. Abdominal aortic aneurysm (AAA) without rupture, unspecified part ([place]) -stable, continues to follow up with vascular.   3. Moderate persistent asthma without complication -stable on Advair.   4. Gastroesophageal reflux disease, unspecified whether esophagitis present -well controlled, controlled with protonix.   5. Gouty arthropathy -stable, continues on allopurinol   6. Hyperlipidemia, unspecified hyperlipidemia type -continue dietary modifications with zocor - Lipid panel - Lipid panel; Future  7. Fatty liver Low fat diet discussed  - COMPLETE METABOLIC PANEL WITH GFR  8. Prostate cancer screening - PSA  9. Anxiety -stable, continues to use xanax as needed  - ALPRAZolam (XANAX) 0.25 MG  tablet; Take 1 tablet by mouth daily as needed for anxiety (to last 30 days).  Dispense: 20 tablet; Refill: 5   Return in about 6 months (around 01/18/2023) for routine follow up . Aaron [name]. [place], [facility] [phone]

## 2022-07-18 ENCOUNTER — Encounter: Payer: Self-pay | Admitting: Nurse Practitioner

## 2022-07-18 ENCOUNTER — Ambulatory Visit (INDEPENDENT_AMBULATORY_CARE_PROVIDER_SITE_OTHER): Payer: 59 | Admitting: Nurse Practitioner

## 2022-07-18 ENCOUNTER — Other Ambulatory Visit (HOSPITAL_COMMUNITY): Payer: Self-pay

## 2022-07-18 ENCOUNTER — Other Ambulatory Visit: Payer: Self-pay

## 2022-07-18 VITALS — BP 132/84 | HR 79 | Temp 97.1°F | Ht 73.2 in | Wt 232.0 lb

## 2022-07-18 DIAGNOSIS — K219 Gastro-esophageal reflux disease without esophagitis: Secondary | ICD-10-CM

## 2022-07-18 DIAGNOSIS — K760 Fatty (change of) liver, not elsewhere classified: Secondary | ICD-10-CM

## 2022-07-18 DIAGNOSIS — J4540 Moderate persistent asthma, uncomplicated: Secondary | ICD-10-CM

## 2022-07-18 DIAGNOSIS — I714 Abdominal aortic aneurysm, without rupture, unspecified: Secondary | ICD-10-CM

## 2022-07-18 DIAGNOSIS — F419 Anxiety disorder, unspecified: Secondary | ICD-10-CM | POA: Diagnosis not present

## 2022-07-18 DIAGNOSIS — M109 Gout, unspecified: Secondary | ICD-10-CM | POA: Diagnosis not present

## 2022-07-18 DIAGNOSIS — Z125 Encounter for screening for malignant neoplasm of prostate: Secondary | ICD-10-CM

## 2022-07-18 DIAGNOSIS — I10 Essential (primary) hypertension: Secondary | ICD-10-CM | POA: Diagnosis not present

## 2022-07-18 DIAGNOSIS — E785 Hyperlipidemia, unspecified: Secondary | ICD-10-CM

## 2022-07-18 MED ORDER — ALPRAZOLAM 0.25 MG PO TABS
0.2500 mg | ORAL_TABLET | Freq: Every day | ORAL | 5 refills | Status: DC | PRN
  Filled 2022-07-18: qty 20, 20d supply, fill #0
  Filled 2022-08-28: qty 20, 30d supply, fill #1
  Filled 2022-10-03: qty 20, 30d supply, fill #2
  Filled 2022-11-05: qty 20, 30d supply, fill #3
  Filled 2022-12-14: qty 20, 30d supply, fill #4

## 2022-07-18 MED ORDER — ALPRAZOLAM 0.25 MG PO TABS
0.2500 mg | ORAL_TABLET | Freq: Every day | ORAL | 5 refills | Status: DC | PRN
  Filled 2022-07-18: qty 20, 20d supply, fill #0
  Filled ????-??-??: fill #0

## 2022-07-19 LAB — CBC WITH DIFFERENTIAL/PLATELET
Absolute Monocytes: 687 cells/uL (ref 200–950)
Basophils Absolute: 82 cells/uL (ref 0–200)
Basophils Relative: 1.2 %
Eosinophils Absolute: 197 cells/uL (ref 15–500)
Eosinophils Relative: 2.9 %
HCT: 45.2 % (ref 38.5–50.0)
Hemoglobin: 15.5 g/dL (ref 13.2–17.1)
Lymphs Abs: 1503 cells/uL (ref 850–3900)
MCH: 31.6 pg (ref 27.0–33.0)
MCHC: 34.3 g/dL (ref 32.0–36.0)
MCV: 92.2 fL (ref 80.0–100.0)
MPV: 9.4 fL (ref 7.5–12.5)
Monocytes Relative: 10.1 %
Neutro Abs: 4332 cells/uL (ref 1500–7800)
Neutrophils Relative %: 63.7 %
Platelets: 340 10*3/uL (ref 140–400)
RBC: 4.9 10*6/uL (ref 4.20–5.80)
RDW: 12 % (ref 11.0–15.0)
Total Lymphocyte: 22.1 %
WBC: 6.8 10*3/uL (ref 3.8–10.8)

## 2022-07-19 LAB — PSA: PSA: 0.43 ng/mL (ref <=4.00)

## 2022-07-19 LAB — COMPLETE METABOLIC PANEL WITH GFR
AG Ratio: 1.9 (calc) (ref 1.0–2.5)
ALT: 37 U/L (ref 9–46)
AST: 33 U/L (ref 10–35)
Albumin: 4.8 g/dL (ref 3.6–5.1)
Alkaline phosphatase (APISO): 81 U/L (ref 35–144)
BUN: 12 mg/dL (ref 7–25)
CO2: 25 mmol/L (ref 20–32)
Calcium: 10.1 mg/dL (ref 8.6–10.3)
Chloride: 104 mmol/L (ref 98–110)
Creat: 1.24 mg/dL (ref 0.70–1.35)
Globulin: 2.5 g/dL (calc) (ref 1.9–3.7)
Glucose, Bld: 93 mg/dL (ref 65–99)
Potassium: 4.6 mmol/L (ref 3.5–5.3)
Sodium: 141 mmol/L (ref 135–146)
Total Bilirubin: 0.5 mg/dL (ref 0.2–1.2)
Total Protein: 7.3 g/dL (ref 6.1–8.1)
eGFR: 65 mL/min/{1.73_m2} (ref >=60)

## 2022-07-19 LAB — LIPID PANEL
Cholesterol: 141 mg/dL (ref <200)
HDL: 46 mg/dL (ref >=40)
LDL Cholesterol (Calc): 69 mg/dL (calc)
Non-HDL Cholesterol (Calc): 95 mg/dL (calc) (ref <130)
Total CHOL/HDL Ratio: 3.1 (calc) (ref <5.0)
Triglycerides: 182 mg/dL — ABNORMAL HIGH (ref <150)

## 2022-08-04 ENCOUNTER — Other Ambulatory Visit (HOSPITAL_COMMUNITY): Payer: Self-pay

## 2022-08-15 ENCOUNTER — Other Ambulatory Visit (HOSPITAL_COMMUNITY): Payer: Self-pay

## 2022-08-25 DIAGNOSIS — M546 Pain in thoracic spine: Secondary | ICD-10-CM | POA: Diagnosis not present

## 2022-08-25 DIAGNOSIS — M9901 Segmental and somatic dysfunction of cervical region: Secondary | ICD-10-CM | POA: Diagnosis not present

## 2022-08-25 DIAGNOSIS — M9902 Segmental and somatic dysfunction of thoracic region: Secondary | ICD-10-CM | POA: Diagnosis not present

## 2022-08-25 DIAGNOSIS — M542 Cervicalgia: Secondary | ICD-10-CM | POA: Diagnosis not present

## 2022-08-29 ENCOUNTER — Other Ambulatory Visit (HOSPITAL_COMMUNITY): Payer: Self-pay

## 2022-09-03 ENCOUNTER — Other Ambulatory Visit (HOSPITAL_COMMUNITY): Payer: Self-pay

## 2022-09-03 ENCOUNTER — Other Ambulatory Visit: Payer: Self-pay | Admitting: Nurse Practitioner

## 2022-09-03 DIAGNOSIS — Z87891 Personal history of nicotine dependence: Secondary | ICD-10-CM

## 2022-09-03 MED ORDER — BUPROPION HCL ER (XL) 150 MG PO TB24
150.0000 mg | ORAL_TABLET | Freq: Every day | ORAL | 1 refills | Status: DC
  Filled 2022-09-03: qty 90, 90d supply, fill #0
  Filled 2022-12-03: qty 90, 90d supply, fill #1
  Filled 2023-03-06 – 2023-03-07 (×2): qty 60, 60d supply, fill #2

## 2022-09-08 ENCOUNTER — Other Ambulatory Visit (HOSPITAL_COMMUNITY): Payer: Self-pay

## 2022-09-08 MED ORDER — DICLOFENAC POTASSIUM 50 MG PO TABS
50.0000 mg | ORAL_TABLET | Freq: Two times a day (BID) | ORAL | 2 refills | Status: DC
  Filled 2022-09-08: qty 60, 30d supply, fill #0
  Filled 2022-10-03: qty 60, 30d supply, fill #1
  Filled 2022-11-05: qty 60, 30d supply, fill #2

## 2022-09-17 ENCOUNTER — Other Ambulatory Visit (HOSPITAL_COMMUNITY): Payer: Self-pay

## 2022-10-03 ENCOUNTER — Other Ambulatory Visit (HOSPITAL_COMMUNITY): Payer: Self-pay

## 2022-10-11 ENCOUNTER — Other Ambulatory Visit (HOSPITAL_COMMUNITY): Payer: Self-pay

## 2022-10-17 ENCOUNTER — Other Ambulatory Visit: Payer: Self-pay | Admitting: Nurse Practitioner

## 2022-10-17 ENCOUNTER — Other Ambulatory Visit (HOSPITAL_COMMUNITY): Payer: Self-pay

## 2022-10-17 DIAGNOSIS — I10 Essential (primary) hypertension: Secondary | ICD-10-CM

## 2022-10-20 ENCOUNTER — Other Ambulatory Visit (HOSPITAL_COMMUNITY): Payer: Self-pay

## 2022-10-20 DIAGNOSIS — H2513 Age-related nuclear cataract, bilateral: Secondary | ICD-10-CM | POA: Diagnosis not present

## 2022-10-20 DIAGNOSIS — H524 Presbyopia: Secondary | ICD-10-CM | POA: Diagnosis not present

## 2022-10-20 DIAGNOSIS — H5213 Myopia, bilateral: Secondary | ICD-10-CM | POA: Diagnosis not present

## 2022-10-20 DIAGNOSIS — Z135 Encounter for screening for eye and ear disorders: Secondary | ICD-10-CM | POA: Diagnosis not present

## 2022-10-20 DIAGNOSIS — H52223 Regular astigmatism, bilateral: Secondary | ICD-10-CM | POA: Diagnosis not present

## 2022-10-20 MED ORDER — AMLODIPINE BESYLATE 10 MG PO TABS
10.0000 mg | ORAL_TABLET | Freq: Every day | ORAL | 1 refills | Status: DC
  Filled 2022-10-20: qty 90, 90d supply, fill #0
  Filled 2023-01-11: qty 90, 90d supply, fill #1

## 2022-10-21 ENCOUNTER — Other Ambulatory Visit (HOSPITAL_COMMUNITY): Payer: Self-pay

## 2022-10-28 ENCOUNTER — Other Ambulatory Visit (HOSPITAL_COMMUNITY): Payer: Self-pay

## 2022-10-28 ENCOUNTER — Other Ambulatory Visit: Payer: Self-pay | Admitting: Nurse Practitioner

## 2022-10-28 DIAGNOSIS — N529 Male erectile dysfunction, unspecified: Secondary | ICD-10-CM

## 2022-10-28 MED ORDER — SILDENAFIL CITRATE 50 MG PO TABS
ORAL_TABLET | ORAL | 1 refills | Status: DC
  Filled 2022-10-28: qty 6, 30d supply, fill #0
  Filled 2022-11-27 (×2): qty 6, 30d supply, fill #1
  Filled 2022-12-25: qty 6, 30d supply, fill #2
  Filled 2023-01-19: qty 6, 30d supply, fill #3

## 2022-11-05 ENCOUNTER — Other Ambulatory Visit (HOSPITAL_COMMUNITY): Payer: Self-pay

## 2022-11-05 ENCOUNTER — Other Ambulatory Visit: Payer: Self-pay

## 2022-11-07 DIAGNOSIS — M542 Cervicalgia: Secondary | ICD-10-CM | POA: Diagnosis not present

## 2022-11-07 DIAGNOSIS — M546 Pain in thoracic spine: Secondary | ICD-10-CM | POA: Diagnosis not present

## 2022-11-07 DIAGNOSIS — M9902 Segmental and somatic dysfunction of thoracic region: Secondary | ICD-10-CM | POA: Diagnosis not present

## 2022-11-07 DIAGNOSIS — M9901 Segmental and somatic dysfunction of cervical region: Secondary | ICD-10-CM | POA: Diagnosis not present

## 2022-11-27 ENCOUNTER — Other Ambulatory Visit (HOSPITAL_COMMUNITY): Payer: Self-pay

## 2022-12-03 ENCOUNTER — Other Ambulatory Visit: Payer: Self-pay

## 2022-12-04 ENCOUNTER — Other Ambulatory Visit: Payer: Self-pay

## 2022-12-14 ENCOUNTER — Other Ambulatory Visit: Payer: Self-pay | Admitting: Nurse Practitioner

## 2022-12-14 ENCOUNTER — Other Ambulatory Visit (HOSPITAL_COMMUNITY): Payer: Self-pay

## 2022-12-14 DIAGNOSIS — K219 Gastro-esophageal reflux disease without esophagitis: Secondary | ICD-10-CM

## 2022-12-14 MED ORDER — DICLOFENAC POTASSIUM 50 MG PO TABS
50.0000 mg | ORAL_TABLET | Freq: Two times a day (BID) | ORAL | 2 refills | Status: DC
  Filled 2022-12-14: qty 60, 30d supply, fill #0
  Filled 2023-01-18 – 2023-01-19 (×2): qty 60, 30d supply, fill #1
  Filled 2023-03-06 – 2023-03-07 (×2): qty 60, 30d supply, fill #2

## 2022-12-15 ENCOUNTER — Other Ambulatory Visit: Payer: Self-pay

## 2022-12-15 ENCOUNTER — Other Ambulatory Visit (HOSPITAL_COMMUNITY): Payer: Self-pay

## 2022-12-15 MED ORDER — PANTOPRAZOLE SODIUM 40 MG PO TBEC
DELAYED_RELEASE_TABLET | Freq: Every day | ORAL | 3 refills | Status: DC
  Filled 2022-12-15: qty 90, 90d supply, fill #0
  Filled 2023-04-01: qty 90, 90d supply, fill #1
  Filled 2023-06-22: qty 90, 90d supply, fill #2
  Filled 2023-10-15: qty 90, 90d supply, fill #3

## 2022-12-25 ENCOUNTER — Other Ambulatory Visit (HOSPITAL_COMMUNITY): Payer: Self-pay

## 2023-01-05 ENCOUNTER — Encounter: Payer: Self-pay | Admitting: Orthopedic Surgery

## 2023-01-05 ENCOUNTER — Ambulatory Visit: Payer: 59 | Admitting: Orthopedic Surgery

## 2023-01-05 ENCOUNTER — Other Ambulatory Visit (HOSPITAL_COMMUNITY): Payer: Self-pay

## 2023-01-05 VITALS — BP 128/78 | HR 69 | Temp 97.3°F | Resp 18 | Ht 73.2 in | Wt 240.0 lb

## 2023-01-05 DIAGNOSIS — R051 Acute cough: Secondary | ICD-10-CM

## 2023-01-05 MED ORDER — PREDNISONE 10 MG PO TABS
ORAL_TABLET | ORAL | 0 refills | Status: AC
  Filled 2023-01-05: qty 11, 5d supply, fill #0
  Filled 2023-01-05: qty 10.5, 5d supply, fill #0

## 2023-01-05 NOTE — Progress Notes (Signed)
Careteam: [name], NP as PCP - General (Nurse Practitioner) [name], [name], NP (Inactive) as Nurse Practitioner (Vascular Surgery) [name], MD as Consulting Physician (General Surgery) Brand Males, MD as Consulting Physician (Pulmonary Disease) [name], MD as Consulting Physician (Cardiology) [name], PA-C as Physician Assistant (Dermatology)  Seen by: Windell Moulding, AGNP-C  PLACE OF SERVICE:  [place] Directive information    No Known Allergies  Chief Complaint  Patient presents with   Acute Visit    Cough     HPI: Patient is a 67 y.o. male seen today for acute visit due to cough. .   Cough began about 3 weeks ago. He took mucinex and OTC cold medication and symptoms improved. He now reports productive cough and sometimes keeps him up at night. Sputum yellow/tan. Denies other cold symptoms, chest pain, malaise or fever. Afebrile. Vitals stable.   Review of Systems:  Review of Systems  Constitutional:  Negative for chills and fever.  HENT: Negative.    Eyes: Negative.   Respiratory:  Positive for cough and sputum production. Negative for shortness of breath and wheezing.   Cardiovascular:  Negative for chest pain and leg swelling.  Gastrointestinal: Negative.   Genitourinary: Negative.   Musculoskeletal: Negative.   Skin: Negative.   Neurological: Negative.   Endo/Heme/Allergies: Negative.   Psychiatric/Behavioral: Negative.      Past Medical History:  Diagnosis Date   Abdominal aortic aneurysm [facility])    sees Dr. Einar Gip for cardiac f/u, [phone]   Arthritis    lower back   Asthma    followed by Dr. Berdie Ogren   Chronic airway obstruction, not elsewhere classified    Dyspnea    normal PFT April 2012   External hemorrhoids without mention of complication    GERD (gastroesophageal reflux disease)    Gout    H/O hiatal hernia    Hemorrhoids    HTN (hypertension)    hx of sees  Dr. Graylin Shiver   Hyperlipidemia    Insomnia, unspecified    Other abnormal blood chemistry    Other dyspnea and respiratory abnormality    Other malaise and fatigue    Other specified erythematous condition(695.89)    Other testicular dysfunction    Prostatitis, unspecified    Rash 2011   left chest, biopsy 2012 Dr. Rozann Lesches, results pending   Skin cancer    Squamous cell carcinoma of skin 11/03/2014   right shoulder cx3 45f   Tobacco abuse    Past Surgical History:  Procedure Laterality Date   ABDOMINAL AORTIC ENDOVASCULAR STENT GRAFT  09/22/2012   Procedure: ABDOMINAL AORTIC ENDOVASCULAR STENT GRAFT;  Surgeon: JMal Misty MD;  Location[place] [facility]  Service: Vascular;  Laterality: N/A;  GORE; ultrasound guided.   APPENDECTOMY     EVALUATION UNDER ANESTHESIA WITH HEMORRHOIDECTOMY N/A 12/31/2016   Procedure: EXAM UNDER ANESTHESIA WITH HEMORRHOIDECTOMY;  Surgeon: DCoralie Keens MD;  Location: [facility]  Service: General;  Laterality: N/A;   SKIN CANCER EXCISION     TOE SURGERY  01/2012   joint of left great toe   TONSILLECTOMY     TONSILLECTOMY     TYMPANOPLASTY     WRIST SURGERY     left   Social History:   reports that he quit smoking about 2 years ago. His smoking use included cigarettes. He has a 9.50 pack-year smoking history. He has never been exposed to tobacco smoke. He quit smokeless  tobacco use about 11 years ago.  His smokeless tobacco use included snuff. He reports current alcohol use. He reports that he does not use drugs.  Family History  Problem Relation Age of Onset   Cancer Mother        Breast   Heart attack Father 63   COPD Father    Heart disease Father    Osler-Weber-Rendu syndrome Brother    Asthma Daughter    Lupus Daughter    Diabetes Other        Grandson    Colon cancer Neg Hx    Pancreatic cancer Neg Hx    Esophageal cancer Neg Hx    Stomach cancer Neg Hx    Rectal cancer Neg Hx     Medications:   Physical  Exam:  Vitals:   01/05/23 0946  BP: 128/78  Pulse: 69  Resp: 18  Temp: (!) 97.3 F (36.3 C)  SpO2: 92%  Weight: 240 lb (108.9 kg)  Height: 6' 1.2" (1.859 m)   Body mass index is 31.49 kg/m. Wt Readings from Last 3 Encounters:  01/05/23 240 lb (108.9 kg)  07/18/22 232 lb (105.2 kg)  04/14/22 238 lb (108 kg)    Physical Exam Vitals reviewed.  Constitutional:      General: He is not in acute distress. HENT:     Head: Normocephalic.  Eyes:     General:        Right eye: No discharge.        Left eye: No discharge.  Cardiovascular:     Rate and Rhythm: Normal rate and regular rhythm.     Pulses: Normal pulses.     Heart sounds: Normal heart sounds.  Pulmonary:     Effort: Pulmonary effort is normal. No respiratory distress.     Breath sounds: Normal breath sounds. No wheezing, rhonchi or rales.  Abdominal:     General: Bowel sounds are normal. There is no distension.     Palpations: Abdomen is soft.     Tenderness: There is no abdominal tenderness.  Musculoskeletal:     Cervical back: Neck supple.     Right lower leg: No edema.     Left lower leg: No edema.  Skin:    General: Skin is warm and dry.     Capillary Refill: Capillary refill takes less than 2 seconds.  Neurological:     General: No focal deficit present.     Mental Status: He is alert and oriented to person, place, and time.  Psychiatric:        Mood and Affect: Mood normal.        Behavior: Behavior normal.    Labs reviewed: Basic Metabolic Panel: Recent Labs    02/17/22 0911 07/18/22 0834  NA 140 141  K 4.2 4.6  CL 107 104  CO2 25 25  GLUCOSE 96 93  BUN 14 12  CREATININE 1.16 1.24  CALCIUM 9.5 10.1   Liver Function Tests: Recent Labs    02/17/22 0911 07/18/22 0834  AST 25 33  ALT 25 37  BILITOT 0.4 0.5  PROT 6.7 7.3   No results for input(s): "LIPASE", "AMYLASE" in the last 8760 hours. No results for input(s): "AMMONIA" in the last 8760 hours. CBC: Recent Labs     02/17/22 0911 07/18/22 0834  WBC 7.3 6.8  NEUTROABS 4,993 4,332  HGB 15.5 15.5  HCT 45.6 45.2  MCV 91.9 92.2  PLT 278 340   Lipid Panel: Recent Labs  02/17/22 0911 07/18/22 0834  CHOL 143 141  HDL 48 46  LDLCALC 72 69  TRIG 152* 182*  CHOLHDL 3.0 3.1   TSH: No results for input(s): "TSH" in the last 8760 hours. A1C: Lab Results  Component Value Date   HGBA1C 5.4 09/18/2020     Assessment/Plan 1. Acute cough - onset x 3 weeks - lung sounds clear - h/o asthma - will start prednisone taper - cont mucinex 600 mg BID x 7 days - advised to contact PCP if symptoms worsen or do not improve - predniSONE (DELTASONE) 10 MG tablet; Take 4 tablets by mouth daily with breakfast for 1 day, THEN 3 tablets daily for 1 day, THEN 2 tablets daily for 1 day, THEN 1 tablet daily with breakfast for 1 day, THEN 1/2 tablet daily  for 1 day.  Dispense: 11 tablet; Refill: 0  Total time: 21 minutes. Greater than 50% of total time spent doing patient education regarding productive cough including symptom/medication management.     Next appt: none Aaron [name], [facility] [phone]

## 2023-01-05 NOTE — Patient Instructions (Addendum)
Please try Zyrtec 10 mg daily x 14 days- works better at night  Continue mucinex 600 mg twice daily x 7 days> hydrate well with water  Will prescribe prednisone taper- please take in the morning

## 2023-01-12 ENCOUNTER — Other Ambulatory Visit: Payer: Self-pay

## 2023-01-16 ENCOUNTER — Other Ambulatory Visit: Payer: 59

## 2023-01-16 DIAGNOSIS — I10 Essential (primary) hypertension: Secondary | ICD-10-CM | POA: Diagnosis not present

## 2023-01-16 DIAGNOSIS — E785 Hyperlipidemia, unspecified: Secondary | ICD-10-CM | POA: Diagnosis not present

## 2023-01-16 DIAGNOSIS — R7309 Other abnormal glucose: Secondary | ICD-10-CM | POA: Diagnosis not present

## 2023-01-18 ENCOUNTER — Other Ambulatory Visit: Payer: Self-pay | Admitting: Nurse Practitioner

## 2023-01-18 DIAGNOSIS — I10 Essential (primary) hypertension: Secondary | ICD-10-CM

## 2023-01-19 ENCOUNTER — Other Ambulatory Visit (HOSPITAL_COMMUNITY): Payer: Self-pay

## 2023-01-19 ENCOUNTER — Other Ambulatory Visit: Payer: Self-pay | Admitting: Nurse Practitioner

## 2023-01-19 ENCOUNTER — Encounter: Payer: Self-pay | Admitting: Nurse Practitioner

## 2023-01-19 ENCOUNTER — Ambulatory Visit
Admission: RE | Admit: 2023-01-19 | Discharge: 2023-01-19 | Disposition: A | Discharge status: Home / Self Care | Payer: 59 | Source: Ambulatory Visit | Attending: Nurse Practitioner | Admitting: Nurse Practitioner

## 2023-01-19 ENCOUNTER — Ambulatory Visit (INDEPENDENT_AMBULATORY_CARE_PROVIDER_SITE_OTHER): Payer: 59 | Admitting: Nurse Practitioner

## 2023-01-19 ENCOUNTER — Other Ambulatory Visit: Payer: Self-pay

## 2023-01-19 VITALS — BP 142/84 | HR 81 | Temp 98.0°F | Resp 17 | Ht 73.0 in | Wt 242.2 lb

## 2023-01-19 DIAGNOSIS — R739 Hyperglycemia, unspecified: Secondary | ICD-10-CM

## 2023-01-19 DIAGNOSIS — J411 Mucopurulent chronic bronchitis: Secondary | ICD-10-CM | POA: Diagnosis not present

## 2023-01-19 DIAGNOSIS — R053 Chronic cough: Secondary | ICD-10-CM

## 2023-01-19 DIAGNOSIS — K219 Gastro-esophageal reflux disease without esophagitis: Secondary | ICD-10-CM | POA: Diagnosis not present

## 2023-01-19 DIAGNOSIS — R059 Cough, unspecified: Secondary | ICD-10-CM | POA: Diagnosis not present

## 2023-01-19 DIAGNOSIS — N529 Male erectile dysfunction, unspecified: Secondary | ICD-10-CM

## 2023-01-19 DIAGNOSIS — Z23 Encounter for immunization: Secondary | ICD-10-CM | POA: Diagnosis not present

## 2023-01-19 DIAGNOSIS — E782 Mixed hyperlipidemia: Secondary | ICD-10-CM

## 2023-01-19 MED ORDER — FLUTICASONE-SALMETEROL 100-50 MCG/ACT IN AEPB
1.0000 | INHALATION_SPRAY | Freq: Two times a day (BID) | RESPIRATORY_TRACT | 5 refills | Status: DC
  Filled 2023-01-19: qty 60, 30d supply, fill #0
  Filled 2023-02-20: qty 60, 30d supply, fill #1
  Filled 2023-04-01: qty 60, 30d supply, fill #2
  Filled 2023-04-29: qty 60, 30d supply, fill #3

## 2023-01-19 MED ORDER — LOSARTAN POTASSIUM 100 MG PO TABS
100.0000 mg | ORAL_TABLET | Freq: Every day | ORAL | 3 refills | Status: DC
  Filled 2023-01-19 (×2): qty 90, 90d supply, fill #0
  Filled 2023-04-18: qty 90, 90d supply, fill #1
  Filled 2023-07-17: qty 90, 90d supply, fill #2
  Filled 2023-10-28: qty 90, 90d supply, fill #3
  Filled 2024-01-30: qty 15, 15d supply, fill #4

## 2023-01-19 MED ORDER — MONTELUKAST SODIUM 10 MG PO TABS
10.0000 mg | ORAL_TABLET | Freq: Every day | ORAL | 3 refills | Status: DC
  Filled 2023-01-19 (×2): qty 30, 30d supply, fill #0
  Filled 2023-04-18: qty 30, 30d supply, fill #1
  Filled 2023-07-23: qty 30, 30d supply, fill #2

## 2023-01-19 MED ORDER — SILDENAFIL CITRATE 50 MG PO TABS
50.0000 mg | ORAL_TABLET | ORAL | 1 refills | Status: DC | PRN
  Filled 2023-01-19: qty 6, 30d supply, fill #0
  Filled 2023-02-20: qty 6, 30d supply, fill #1
  Filled 2023-04-01: qty 6, 30d supply, fill #2

## 2023-01-19 NOTE — Telephone Encounter (Signed)
Pharmacy comment: only 2 tabs left on the rx  - thanks!

## 2023-01-19 NOTE — Patient Instructions (Addendum)
-   Use a mild, unscented soap.  You can use what you like (Dove, Aveeno, etc). - After the bath apply a good lotion (unscented).   -recommendations include- Aveeno eczema, Aquafor, Eucerin. -make sure to stay hydrated.    To decrease sugar Cut back on sweets  To increase fiber in diet-- can use benefiber in coffee  Can also use probiotic   Increase water intake 64 oz daily of water   To call GI office for follow up colonscopy

## 2023-01-19 NOTE — Progress Notes (Signed)
Careteam: Patient Care Team: [name], NP as PCP - General (Nurse Practitioner) [name], [name], NP (Inactive) as Nurse Practitioner (Vascular Surgery) [name], MD as Consulting Physician (General Surgery) [name], MD as Consulting Physician (Pulmonary Disease) [name], MD as Consulting Physician (Cardiology) [name], PA-C as Physician Assistant (Dermatology)  PLACE OF SERVICE:  [facility]  Advanced Directive information Does Patient Have a Medical Advance Directive?: No  No Known Allergies  Chief Complaint  Patient presents with   Medical Management of Chronic Issues    6 month follow. Patient states he is having a cough before his appointment on 01/05/2023     HPI: Patient is a 67 y.o. male here for routine follow-up.  He was treated for a cough recently but the cough is still on-going. He has had the cough since about Christmas. Mucinex and delsym prior to taking prednisone. Didn't seem to help at all. Stopped the zyrtec because he didn't feel that it was allergy related. His wife is present here today and is concerned because he has had lung nodules before on imaging. No fevers or chills. The cough has not worsened.   Productive cough has been mostly clear with a green tint at times. Congested cough. Doesn't notice that his cough is more productive at any time of day. He does cough more at night.   He has to use his albuterol inhaler sometimes with coughing fits and it does help, but he isn't even using it once a week. Denies shortness of breath other than with some coughing fits. Denies chest pain.  Bowels are somewhat irregular. Alternates between diarrhea and constipation. Denies blood in stool.   Tops of both feet have been sore. Somewhat numb and is worse at night. His shins have been itchy. There is no rash.   His old dermatologist is no longer in practice and he needs a new referral.   Eating a lot of red meat at home.  Eating fruits and vegetables. Eats a lot of sweets and drinks a lot of cokes.   He wants to know if he is due for his colonoscopy. He wants his flu shot today.   Review of Systems:  Review of Systems  Constitutional:  Negative for chills, fever, malaise/fatigue and weight loss.  HENT:  Negative for congestion and sore throat.   Eyes:  Negative for blurred vision.  Respiratory:  Positive for cough, sputum production and shortness of breath. Negative for wheezing.        SOB only with coughing fits. Albuterol helps but uses rarely.  Cardiovascular:  Negative for chest pain, palpitations and leg swelling.  Gastrointestinal:  Negative for abdominal pain, blood in stool, constipation, diarrhea, heartburn, nausea and vomiting.  Genitourinary:  Negative for dysuria, frequency, hematuria and urgency.  Musculoskeletal:  Negative for falls and joint pain.  Skin:  Negative for rash.  Neurological:  Negative for dizziness, tingling and headaches.  Endo/Heme/Allergies:  Negative for polydipsia.  Psychiatric/Behavioral:  Negative for depression. The patient is not nervous/anxious.     Past Medical History:  Diagnosis Date   Abdominal aortic aneurysm [facility])    sees Dr. Jacinto Halim for cardiac f/u, [phone]   Arthritis    lower back   Asthma    followed by Dr. Samara Snide   Chronic airway obstruction, not elsewhere classified    Dyspnea    normal PFT April 2012   External hemorrhoids without mention of complication    GERD (gastroesophageal reflux disease)  Gout    H/O hiatal hernia    Hemorrhoids    HTN (hypertension)    hx of sees Dr. Alonna Minium   Hyperlipidemia    Insomnia, unspecified    Other abnormal blood chemistry    Other dyspnea and respiratory abnormality    Other malaise and fatigue    Other specified erythematous condition(695.89)    Other testicular dysfunction    Prostatitis, unspecified    Rash 2011   left chest, biopsy 2012 Dr. Charlton Haws, results pending   Skin  cancer    Squamous cell carcinoma of skin 11/03/2014   right shoulder cx3 8fu   Tobacco abuse    Past Surgical History:  Procedure Laterality Date   ABDOMINAL AORTIC ENDOVASCULAR STENT GRAFT  09/22/2012   Procedure: ABDOMINAL AORTIC ENDOVASCULAR STENT GRAFT;  Surgeon: Pryor Ochoa, MD;  Location: [facility] OR;  Service: Vascular;  Laterality: N/A;  GORE; ultrasound guided.   APPENDECTOMY     EVALUATION UNDER ANESTHESIA WITH HEMORRHOIDECTOMY N/A 12/31/2016   Procedure: EXAM UNDER ANESTHESIA WITH HEMORRHOIDECTOMY;  Surgeon: [name], MD;  Location: [facility];  Service: General;  Laterality: N/A;   SKIN CANCER EXCISION     TOE SURGERY  01/2012   joint of left great toe   TONSILLECTOMY     TONSILLECTOMY     TYMPANOPLASTY     WRIST SURGERY     left   Social History:   reports that he quit smoking about 2 years ago. His smoking use included cigarettes. He has a 9.50 pack-year smoking history. He has never been exposed to tobacco smoke. He quit smokeless tobacco use about 11 years ago.  His smokeless tobacco use included snuff. He reports current alcohol use. He reports that he does not use drugs.  Family History  Problem Relation Age of Onset   Cancer Mother        Breast   Heart attack Father 1   COPD Father    Heart disease Father    Osler-Weber-Rendu syndrome Brother    Asthma Daughter    Lupus Daughter    Diabetes Other        Grandson    Colon cancer Neg Hx    Pancreatic cancer Neg Hx    Esophageal cancer Neg Hx    Stomach cancer Neg Hx    Rectal cancer Neg Hx     Medications: Patient's Medications  New Prescriptions   MONTELUKAST (SINGULAIR) 10 MG TABLET    Take 1 tablet (10 mg total) by mouth at bedtime.  Previous Medications   ACETAMINOPHEN (TYLENOL) 500 MG TABLET    Take 2 tablets (1,000 mg total) by mouth every 6 (six) hours as needed.   ALBUTEROL (VENTOLIN HFA) 108 (90 BASE) MCG/ACT INHALER    Inhale 2 puffs into the lungs every 6 hours as  needed for cough and wheezing.   ALLOPURINOL (ZYLOPRIM) 100 MG TABLET    Take 1 tablet (100 mg total) by mouth daily.   AMLODIPINE (NORVASC) 10 MG TABLET    Take 1 tablet (10 mg total) by mouth daily.   ASPIRIN EC 81 MG TABLET    Take 1 tablet (81 mg total) by mouth daily.   BUPROPION (WELLBUTRIN XL) 150 MG 24 HR TABLET    Take 1 tablet (150 mg total) by mouth daily.   COLCHICINE 0.6 MG CAPS    Take 1 capsule by mouth 2 (two) times daily as needed.   DICLOFENAC (CATAFLAM) 50 MG TABLET  Take 1 tablet (50 mg total) by mouth 2 (two) times daily.   LOSARTAN (COZAAR) 100 MG TABLET    Take 1 tablet (100 mg total) by mouth daily.   PANTOPRAZOLE (PROTONIX) 40 MG TABLET    TAKE 1 TABLET BY MOUTH ONCE A DAY   SILDENAFIL (VIAGRA) 50 MG TABLET    Take one tablet by mouth as needed for erectile dysfunction.   SIMVASTATIN (ZOCOR) 20 MG TABLET    TAKE 1 TABLET BY MOUTH AT BEDTIME  Modified Medications   Modified Medication Previous Medication   FLUTICASONE-SALMETEROL (ADVAIR DISKUS) 100-50 MCG/ACT AEPB fluticasone-salmeterol (ADVAIR DISKUS) 100-50 MCG/ACT AEPB      Inhale 1 puff into the lungs 2 (two) times daily.  *REPLACES BREO ELLIPTA*    Inhale 1 puff into the lungs 2 (two) times daily.  *REPLACES BREO ELLIPTA*  Discontinued Medications   No medications on file    Physical Exam:  Vitals:   01/19/23 0838 01/19/23 0844  BP: (!) 144/82 (!) 142/84  Pulse: 81   Resp: 17   Temp: 98 F (36.7 C)   TempSrc: Temporal   SpO2: 94%   Weight: 242 lb 3.2 oz (109.9 kg)   Height: 6\' 1"  (1.854 m)    Body mass index is 31.95 kg/m. Wt Readings from Last 3 Encounters:  01/19/23 242 lb 3.2 oz (109.9 kg)  01/05/23 240 lb (108.9 kg)  07/18/22 232 lb (105.2 kg)    Physical Exam Vitals reviewed.  Cardiovascular:     Rate and Rhythm: Normal rate and regular rhythm.     Heart sounds: Normal heart sounds. No murmur heard. Pulmonary:     Breath sounds: Normal breath sounds. No wheezing or rales.  Abdominal:      General: Bowel sounds are normal.     Palpations: Abdomen is soft. There is no mass.     Tenderness: There is no abdominal tenderness. There is no guarding.  Musculoskeletal:        General: No swelling or tenderness.  Skin:    General: Skin is warm and dry.  Neurological:     Mental Status: He is alert and oriented to person, place, and time. Mental status is at baseline.  Psychiatric:        Mood and Affect: Mood normal.        Behavior: Behavior normal.        Judgment: Judgment normal.     Labs reviewed: Basic Metabolic Panel: Recent Labs    02/17/22 0911 07/18/22 0834 01/16/23 0802  NA 140 141 142  K 4.2 4.6 4.3  CL 107 104 106  CO2 25 25 25   GLUCOSE 96 93 105*  BUN 14 12 21   CREATININE 1.16 1.24 1.29  CALCIUM 9.5 10.1 9.4   Liver Function Tests: Recent Labs    02/17/22 0911 07/18/22 0834 01/16/23 0802  AST 25 33 30  ALT 25 37 45  BILITOT 0.4 0.5 0.3  PROT 6.7 7.3 6.8   No results for input(s): "LIPASE", "AMYLASE" in the last 8760 hours. No results for input(s): "AMMONIA" in the last 8760 hours. CBC: Recent Labs    02/17/22 0911 07/18/22 0834 01/16/23 0802  WBC 7.3 6.8 6.6  NEUTROABS 4,993 4,332 4,000  HGB 15.5 15.5 15.0  HCT 45.6 45.2 43.6  MCV 91.9 92.2 90.6  PLT 278 340 262   Lipid Panel: Recent Labs    02/17/22 0911 07/18/22 0834 01/16/23 0802  CHOL 143 141 141  HDL 48 46 44  LDLCALC 72 69 73  TRIG 152* 182* 162*  CHOLHDL 3.0 3.1 3.2   TSH: No results for input(s): "TSH" in the last 8760 hours. A1C: Lab Results  Component Value Date   HGBA1C 5.4 09/18/2020     Assessment/Plan 1. Mixed hyperlipidemia LDL at goal, Triglycerides elevated discussed cutting back carbohydrates and sweets with patient. Will continue to monitor.   2. Chronic bronchitis with productive mucopurulent cough (HCC) Patient has not been taking mucinex. Prefers not to take antihistamine (stopped previously prescribed zyrtec). Feels like the mucinex  does help. Restart mucinex. Encouraged patient to maintain adequate hydration with water.  -montelukast (SINGULAIR) 10 MG tablet; Take 1 tablet (10 mg total) by mouth at bedtime.  Dispense: 30 tablet; Refill: 3  3. Gastroesophageal reflux disease, unspecified whether esophagitis present Well-controlled on protonix. Continue dietary modifications.   4. Hyperglycemia Discussed diet and lifestyle changes with patient, cutting back on sugars, he eats lots of sweet. He can cut back on sodas and dessert; he is in agreement to try. -will add on A1c  5. Chronic cough Patient has had an ongoing cough since December. Hx of asthma and previous smoker with stable (benign) lung nodules on CT from 2018.  - DG Chest 2 View; Future - montelukast (SINGULAIR) 10 MG tablet; Take 1 tablet (10 mg total) by mouth at bedtime.  Dispense: 30 tablet; Refill: 3  6. Needs flu shot Received today. - Flu Vaccine QUAD 6+ mos PF IM (Fluarix Quad PF)   Return in about 6 months (around 07/20/2023) for routine follow up . Sooner if needed   Student- Kamna O'Berry ACPCNP-S  I personally was present during the history, physical exam and medical decision-making activities of this service and have verified that the service and findings are accurately documented in the student's note Benna Arno K. Biagio Borg [facility] [phone]

## 2023-01-21 ENCOUNTER — Encounter: Payer: Self-pay | Admitting: Nurse Practitioner

## 2023-01-22 LAB — CBC WITH DIFFERENTIAL/PLATELET
Absolute Monocytes: 700 cells/uL (ref 200–950)
Basophils Absolute: 73 cells/uL (ref 0–200)
Basophils Relative: 1.1 %
Eosinophils Absolute: 290 cells/uL (ref 15–500)
Eosinophils Relative: 4.4 %
HCT: 43.6 % (ref 38.5–50.0)
Hemoglobin: 15 g/dL (ref 13.2–17.1)
Lymphs Abs: 1538 cells/uL (ref 850–3900)
MCH: 31.2 pg (ref 27.0–33.0)
MCHC: 34.4 g/dL (ref 32.0–36.0)
MCV: 90.6 fL (ref 80.0–100.0)
MPV: 9.8 fL (ref 7.5–12.5)
Monocytes Relative: 10.6 %
Neutro Abs: 4000 cells/uL (ref 1500–7800)
Neutrophils Relative %: 60.6 %
Platelets: 262 10*3/uL (ref 140–400)
RBC: 4.81 10*6/uL (ref 4.20–5.80)
RDW: 12 % (ref 11.0–15.0)
Total Lymphocyte: 23.3 %
WBC: 6.6 10*3/uL (ref 3.8–10.8)

## 2023-01-22 LAB — COMPLETE METABOLIC PANEL WITH GFR
AG Ratio: 1.7 (calc) (ref 1.0–2.5)
ALT: 45 U/L (ref 9–46)
AST: 30 U/L (ref 10–35)
Albumin: 4.3 g/dL (ref 3.6–5.1)
Alkaline phosphatase (APISO): 85 U/L (ref 35–144)
BUN: 21 mg/dL (ref 7–25)
CO2: 25 mmol/L (ref 20–32)
Calcium: 9.4 mg/dL (ref 8.6–10.3)
Chloride: 106 mmol/L (ref 98–110)
Creat: 1.29 mg/dL (ref 0.70–1.35)
Globulin: 2.5 g/dL (calc) (ref 1.9–3.7)
Glucose, Bld: 105 mg/dL — ABNORMAL HIGH (ref 65–99)
Potassium: 4.3 mmol/L (ref 3.5–5.3)
Sodium: 142 mmol/L (ref 135–146)
Total Bilirubin: 0.3 mg/dL (ref 0.2–1.2)
Total Protein: 6.8 g/dL (ref 6.1–8.1)
eGFR: 62 mL/min/{1.73_m2} (ref >=60)

## 2023-01-22 LAB — LIPID PANEL
Cholesterol: 141 mg/dL (ref <200)
HDL: 44 mg/dL (ref >=40)
LDL Cholesterol (Calc): 73 mg/dL (calc)
Non-HDL Cholesterol (Calc): 97 mg/dL (calc) (ref <130)
Total CHOL/HDL Ratio: 3.2 (calc) (ref <5.0)
Triglycerides: 162 mg/dL — ABNORMAL HIGH (ref <150)

## 2023-01-22 LAB — TEST AUTHORIZATION

## 2023-01-22 LAB — HEMOGLOBIN A1C W/OUT EAG: Hgb A1c MFr Bld: 5.8 % of total Hgb — ABNORMAL HIGH (ref <5.7)

## 2023-02-20 ENCOUNTER — Other Ambulatory Visit (HOSPITAL_COMMUNITY): Payer: Self-pay

## 2023-02-20 ENCOUNTER — Other Ambulatory Visit: Payer: Self-pay | Admitting: Nurse Practitioner

## 2023-02-20 DIAGNOSIS — F419 Anxiety disorder, unspecified: Secondary | ICD-10-CM

## 2023-02-23 ENCOUNTER — Other Ambulatory Visit (HOSPITAL_COMMUNITY): Payer: Self-pay

## 2023-02-23 MED ORDER — ALPRAZOLAM 0.25 MG PO TABS
0.2500 mg | ORAL_TABLET | Freq: Every day | ORAL | 5 refills | Status: DC | PRN
  Filled 2023-02-23: qty 20, 30d supply, fill #0
  Filled 2023-03-06 – 2023-03-07 (×2): qty 20, 20d supply, fill #0
  Filled 2023-04-07: qty 20, 20d supply, fill #1

## 2023-02-23 NOTE — Telephone Encounter (Signed)
Pharmacy requested.  Medication has been taken out of patient's current medication list but last OV note states that patient still uses As Needed.   Pended Rx and sent to [facility] for approval.

## 2023-02-24 ENCOUNTER — Other Ambulatory Visit: Payer: Self-pay

## 2023-02-25 ENCOUNTER — Other Ambulatory Visit (HOSPITAL_COMMUNITY): Payer: Self-pay

## 2023-02-27 ENCOUNTER — Other Ambulatory Visit: Payer: Self-pay

## 2023-03-06 ENCOUNTER — Other Ambulatory Visit: Payer: Self-pay | Admitting: Nurse Practitioner

## 2023-03-06 ENCOUNTER — Other Ambulatory Visit (HOSPITAL_COMMUNITY): Payer: Self-pay

## 2023-03-06 MED ORDER — ALLOPURINOL 100 MG PO TABS
100.0000 mg | ORAL_TABLET | Freq: Every day | ORAL | 3 refills | Status: DC
  Filled 2023-03-06 – 2023-03-07 (×2): qty 90, 90d supply, fill #0
  Filled 2023-06-08: qty 90, 90d supply, fill #1
  Filled 2023-09-21: qty 90, 90d supply, fill #2
  Filled 2023-12-17: qty 90, 90d supply, fill #3

## 2023-03-07 ENCOUNTER — Other Ambulatory Visit (HOSPITAL_COMMUNITY): Payer: Self-pay

## 2023-04-01 ENCOUNTER — Other Ambulatory Visit (HOSPITAL_COMMUNITY): Payer: Self-pay

## 2023-04-01 MED ORDER — DICLOFENAC POTASSIUM 50 MG PO TABS
50.0000 mg | ORAL_TABLET | Freq: Two times a day (BID) | ORAL | 2 refills | Status: DC
  Filled 2023-04-01: qty 60, 30d supply, fill #0
  Filled 2023-04-29: qty 60, 30d supply, fill #1
  Filled 2023-06-22: qty 60, 30d supply, fill #2

## 2023-04-02 ENCOUNTER — Other Ambulatory Visit: Payer: Self-pay | Admitting: Nurse Practitioner

## 2023-04-02 ENCOUNTER — Other Ambulatory Visit: Payer: Self-pay

## 2023-04-02 ENCOUNTER — Other Ambulatory Visit (HOSPITAL_COMMUNITY): Payer: Self-pay

## 2023-04-02 ENCOUNTER — Encounter: Payer: Self-pay | Admitting: Pharmacist

## 2023-04-02 DIAGNOSIS — J4540 Moderate persistent asthma, uncomplicated: Secondary | ICD-10-CM

## 2023-04-02 MED ORDER — ALBUTEROL SULFATE HFA 108 (90 BASE) MCG/ACT IN AERS
2.0000 | INHALATION_SPRAY | Freq: Four times a day (QID) | RESPIRATORY_TRACT | 3 refills | Status: DC | PRN
Start: 2023-04-02 — End: 2024-09-06
  Filled 2023-04-02 – 2023-07-23 (×2): qty 20.1, 75d supply, fill #0
  Filled 2023-11-24 – 2023-11-27 (×2): qty 20.1, 75d supply, fill #1
  Filled 2024-01-02 – 2024-02-12 (×3): qty 20.1, 75d supply, fill #2

## 2023-04-07 ENCOUNTER — Other Ambulatory Visit: Payer: Self-pay

## 2023-04-17 ENCOUNTER — Encounter: Payer: 59 | Admitting: Nurse Practitioner

## 2023-04-18 ENCOUNTER — Other Ambulatory Visit: Payer: Self-pay | Admitting: Nurse Practitioner

## 2023-04-18 ENCOUNTER — Other Ambulatory Visit (HOSPITAL_COMMUNITY): Payer: Self-pay

## 2023-04-18 DIAGNOSIS — I10 Essential (primary) hypertension: Secondary | ICD-10-CM

## 2023-04-21 ENCOUNTER — Encounter: Payer: Self-pay | Admitting: Nurse Practitioner

## 2023-04-21 ENCOUNTER — Other Ambulatory Visit: Payer: Self-pay

## 2023-04-21 ENCOUNTER — Other Ambulatory Visit (HOSPITAL_COMMUNITY): Payer: Self-pay

## 2023-04-21 MED ORDER — AMLODIPINE BESYLATE 10 MG PO TABS
10.0000 mg | ORAL_TABLET | Freq: Every day | ORAL | 1 refills | Status: DC
Start: 2023-04-21 — End: 2023-10-15
  Filled 2023-04-21: qty 90, 90d supply, fill #0
  Filled 2023-07-17: qty 90, 90d supply, fill #1

## 2023-04-23 ENCOUNTER — Encounter: Payer: Self-pay | Admitting: Nurse Practitioner

## 2023-04-24 ENCOUNTER — Encounter: Payer: Self-pay | Admitting: Nurse Practitioner

## 2023-04-24 ENCOUNTER — Ambulatory Visit (INDEPENDENT_AMBULATORY_CARE_PROVIDER_SITE_OTHER): Payer: 59 | Admitting: Nurse Practitioner

## 2023-04-24 VITALS — BP 138/82 | HR 70 | Temp 97.1°F | Ht 72.0 in | Wt 236.0 lb

## 2023-04-24 DIAGNOSIS — E6609 Other obesity due to excess calories: Secondary | ICD-10-CM

## 2023-04-24 DIAGNOSIS — I10 Essential (primary) hypertension: Secondary | ICD-10-CM

## 2023-04-24 DIAGNOSIS — Z1159 Encounter for screening for other viral diseases: Secondary | ICD-10-CM | POA: Diagnosis not present

## 2023-04-24 DIAGNOSIS — Z0000 Encounter for general adult medical examination without abnormal findings: Secondary | ICD-10-CM | POA: Diagnosis not present

## 2023-04-24 DIAGNOSIS — Z6832 Body mass index (BMI) 32.0-32.9, adult: Secondary | ICD-10-CM

## 2023-04-24 DIAGNOSIS — I4510 Unspecified right bundle-branch block: Secondary | ICD-10-CM

## 2023-04-24 NOTE — Patient Instructions (Signed)
Health Maintenance, Male Adopting a healthy lifestyle and getting preventive care are important in promoting health and wellness. Ask your health care provider about: The right schedule for you to have regular tests and exams. Things you can do on your own to prevent diseases and keep yourself healthy. What should I know about diet, weight, and exercise? Eat a healthy diet  Eat a diet that includes plenty of vegetables, fruits, low-fat dairy products, and lean protein. Do not eat a lot of foods that are high in solid fats, added sugars, or sodium. Maintain a healthy weight Body mass index (BMI) is a measurement that can be used to identify possible weight problems. It estimates body fat based on height and weight. Your health care provider can help determine your BMI and help you achieve or maintain a healthy weight. Get regular exercise Get regular exercise. This is one of the most important things you can do for your health. Most adults should: Exercise for at least 150 minutes each week. The exercise should increase your heart rate and make you sweat (moderate-intensity exercise). Do strengthening exercises at least twice a week. This is in addition to the moderate-intensity exercise. Spend less time sitting. Even light physical activity can be beneficial. Watch cholesterol and blood lipids Have your blood tested for lipids and cholesterol at 65 years of age, then have this test every 5 years. You may need to have your cholesterol levels checked more often if: Your lipid or cholesterol levels are high. You are older than 65 years of age. You are at high risk for heart disease. What should I know about cancer screening? Many types of cancers can be detected early and may often be prevented. Depending on your health history and family history, you may need to have cancer screening at various ages. This may include screening for: Colorectal cancer. Prostate cancer. Skin cancer. Lung  cancer. What should I know about heart disease, diabetes, and high blood pressure? Blood pressure and heart disease High blood pressure causes heart disease and increases the risk of stroke. This is more likely to develop in people who have high blood pressure readings or are overweight. Talk with your health care provider about your target blood pressure readings. Have your blood pressure checked: Every 3-5 years if you are 18-39 years of age. Every year if you are 40 years old or older. If you are between the ages of 65 and 75 and are a current or former smoker, ask your health care provider if you should have a one-time screening for abdominal aortic aneurysm (AAA). Diabetes Have regular diabetes screenings. This checks your fasting blood sugar level. Have the screening done: Once every three years after age 45 if you are at a normal weight and have a low risk for diabetes. More often and at a younger age if you are overweight or have a high risk for diabetes. What should I know about preventing infection? Hepatitis B If you have a higher risk for hepatitis B, you should be screened for this virus. Talk with your health care provider to find out if you are at risk for hepatitis B infection. Hepatitis C Blood testing is recommended for: Everyone born from 1945 through 1965. Anyone with known risk factors for hepatitis C. Sexually transmitted infections (STIs) You should be screened each year for STIs, including gonorrhea and chlamydia, if: You are sexually active and are younger than 65 years of age. You are older than 65 years of age and your   health care provider tells you that you are at risk for this type of infection. Your sexual activity has changed since you were last screened, and you are at increased risk for chlamydia or gonorrhea. Ask your health care provider if you are at risk. Ask your health care provider about whether you are at high risk for HIV. Your health care provider  may recommend a prescription medicine to help prevent HIV infection. If you choose to take medicine to prevent HIV, you should first get tested for HIV. You should then be tested every 3 months for as long as you are taking the medicine. Follow these instructions at home: Alcohol use Do not drink alcohol if your health care provider tells you not to drink. If you drink alcohol: Limit how much you have to 0-2 drinks a day. Know how much alcohol is in your drink. In the [place] one drink equals one 12 oz bottle of beer (355 mL), one 5 oz glass of wine (148 mL), or one 1 oz glass of hard liquor (44 mL). Lifestyle Do not use any products that contain nicotine or tobacco. These products include cigarettes, chewing tobacco, and vaping devices, such as e-cigarettes. If you need help quitting, ask your health care provider. Do not use street drugs. Do not share needles. Ask your health care provider for help if you need support or information about quitting drugs. General instructions Schedule regular health, dental, and eye exams. Stay current with your vaccines. Tell your health care provider if: You often feel depressed. You have ever been abused or do not feel safe at home. Summary Adopting a healthy lifestyle and getting preventive care are important in promoting health and wellness. Follow your health care provider's instructions about healthy diet, exercising, and getting tested or screened for diseases. Follow your health care provider's instructions on monitoring your cholesterol and blood pressure. This information is not intended to replace advice given to you by your health care provider. Make sure you discuss any questions you have with your health care provider. Document Revised: 04/01/2021 Document Reviewed: 04/01/2021 Elsevier Patient Education  2024 [employer]  

## 2023-04-24 NOTE — Progress Notes (Signed)
Provider: [name], NP  Patient Care Team: [name], NP as PCP - General (Nurse Practitioner) [name], [name], NP (Inactive) as Nurse Practitioner (Vascular Surgery) [name], MD as Consulting Physician (General Surgery) [name], MD as Consulting Physician (Pulmonary Disease) [name], MD as Consulting Physician (Cardiology) [name], PA-C as Physician Assistant (Dermatology)  Extended Emergency Contact Information Primary Emergency Contact: Daman,[name] Address: [address]          [place], [place] [address] [place] of [place] Home Phone: [phone] Mobile Phone: [phone] Relation: Spouse No Known Allergies Code Status: full Goals of Care: Advanced Directive information    04/24/2023    8:36 AM  Advanced Directives  Does Patient Have a Medical Advance Directive? No  Would patient like information on creating a medical advance directive? Yes (MAU/Ambulatory/Procedural Areas - Information given)     Chief Complaint  Patient presents with   Annual Exam    Yearly physical. Discuss need for covid boosters. [facility] verified. Patient c/o of hemorrhoids.     HPI: Patient is a 67 y.o. male seen in today for an wellness exam at [facility]  Last labs in February 2024  Reports he could exercise more.         04/24/2023    8:35 AM 01/19/2023    8:41 AM 04/14/2022    8:12 AM 09/30/2021    8:25 AM 04/08/2021    9:39 AM  Depression screen PHQ 2/9  Decreased Interest 0 0 0 0 0  Down, Depressed, Hopeless 0 0 0 0 0  PHQ - 2 Score 0 0 0 0 0       01/03/2022    8:07 AM 04/14/2022    8:11 AM 01/05/2023    9:51 AM 01/19/2023    8:41 AM 04/24/2023    8:34 AM  Fall Risk  Falls in the past year? 1 0 0 0 1  Was there an injury with Fall? 0 0 0 0 0  Fall Risk Category Calculator 2 0 0 0 1  Fall Risk Category (Retired) Moderate Low     (RETIRED) Patient Fall Risk Level Moderate fall risk Low fall risk     Patient at Risk for  Falls Due to No Fall Risks;History of fall(s) No Fall Risks No Fall Risks No Fall Risks No Fall Risks  Fall risk Follow up Falls evaluation completed Falls evaluation completed Falls evaluation completed Falls evaluation completed        No data to display           Health Maintenance  Topic Date Due   INFLUENZA VACCINE  06/25/2023   DTaP/Tdap/Td (2 - Td or Tdap) 12/07/2023   Colonoscopy  10/03/2026   Zoster Vaccines- Shingrix  Completed   HPV VACCINES  Aged Out   COVID-19 Vaccine  Discontinued   Hepatitis C Screening  Discontinued   HIV Screening  Discontinued    Past Medical History:  Diagnosis Date   Abdominal aortic aneurysm (HCC)    sees Dr. Jacinto Halim for cardiac f/u, [phone]   Arthritis    lower back   Asthma    followed by Dr. Samara Snide   Chronic airway obstruction, not elsewhere classified    Dyspnea    normal PFT April 2012   External hemorrhoids without mention of complication    GERD (gastroesophageal reflux disease)    Gout    H/O hiatal hernia    Hemorrhoids    HTN (hypertension)    hx  of sees Dr. Alonna Minium   Hyperlipidemia    Insomnia, unspecified    Other abnormal blood chemistry    Other dyspnea and respiratory abnormality    Other malaise and fatigue    Other specified erythematous condition(695.89)    Other testicular dysfunction    Prostatitis, unspecified    Rash 2011   left chest, biopsy 2012 Dr. Charlton Haws, results pending   Skin cancer    Squamous cell carcinoma of skin 11/03/2014   right shoulder cx3 25fu   Tobacco abuse     Past Surgical History:  Procedure Laterality Date   ABDOMINAL AORTIC ENDOVASCULAR STENT GRAFT  09/22/2012   Procedure: ABDOMINAL AORTIC ENDOVASCULAR STENT GRAFT;  Surgeon: Pryor Ochoa, MD;  Location: [facility] OR;  Service: Vascular;  Laterality: N/A;  GORE; ultrasound guided.   APPENDECTOMY     EVALUATION UNDER ANESTHESIA WITH HEMORRHOIDECTOMY N/A 12/31/2016   Procedure: EXAM UNDER ANESTHESIA WITH  HEMORRHOIDECTOMY;  Surgeon: [name], MD;  Location: [facility];  Service: General;  Laterality: N/A;   SKIN CANCER EXCISION     TOE SURGERY  01/2012   joint of left great toe   TONSILLECTOMY     TONSILLECTOMY     TYMPANOPLASTY     WRIST SURGERY     left    Social History   Socioeconomic History   Marital status: Married    Spouse name: Not on file   Number of children: Not on file   Years of education: Not on file   Highest education level: Not on file  Occupational History   Occupation: CNA    Employer: [employer]  Tobacco Use   Smoking status: Former    Packs/day: 0.25    Years: 38.00    Additional pack years: 0.00    Total pack years: 9.50    Types: Cigarettes    Quit date: 10/31/2020    Years since quitting: 2.4    Passive exposure: Never   Smokeless tobacco: Former    Types: Snuff    Quit date: 11/25/2011  Vaping Use   Vaping Use: Never used  Substance and Sexual Activity   Alcohol use: Yes    Alcohol/week: 0.0 standard drinks of alcohol    Comment: occ   Drug use: No   Sexual activity: Not on file  Other Topics Concern   Not on file  Social History Narrative   Not on file   Social Determinants of Health   Financial Resource Strain: Not on file  Food Insecurity: Not on file  Transportation Needs: Not on file  Physical Activity: Not on file  Stress: Not on file  Social Connections: Not on file    Family History  Problem Relation Age of Onset   Cancer Mother        Breast   Heart attack Father 70   COPD Father    Heart disease Father    Osler-Weber-Rendu syndrome Brother    Asthma Daughter    Lupus Daughter    Diabetes Other        Grandson    Colon cancer Neg Hx    Pancreatic cancer Neg Hx    Esophageal cancer Neg Hx    Stomach cancer Neg Hx    Rectal cancer Neg Hx     Review of Systems:  Review of Systems  Constitutional:  Negative for chills, fever and weight loss.  HENT:  Negative for tinnitus.    Respiratory:  Negative for cough, sputum production and  shortness of breath.   Cardiovascular:  Negative for chest pain, palpitations and leg swelling.  Gastrointestinal:  Negative for abdominal pain, constipation, diarrhea and heartburn.  Genitourinary:  Negative for dysuria, frequency and urgency.  Musculoskeletal:  Negative for back pain, falls, joint pain and myalgias.  Skin: Negative.   Neurological:  Negative for dizziness and headaches.  Psychiatric/Behavioral:  Negative for depression and memory loss. The patient does not have insomnia.      Allergies as of 04/24/2023   No Known Allergies      Medication List        Accurate as of Apr 24, 2023  8:53 AM. If you have any questions, ask your nurse or doctor.          acetaminophen 500 MG tablet Commonly known as: TYLENOL Take 2 tablets (1,000 mg total) by mouth every 6 (six) hours as needed.   albuterol 108 (90 Base) MCG/ACT inhaler Commonly known as: VENTOLIN HFA Inhale 2 puffs into the lungs every 6 (six) hours as needed for cough and wheezing.   allopurinol 100 MG tablet Commonly known as: ZYLOPRIM Take 1 tablet (100 mg total) by mouth daily.   ALPRAZolam 0.25 MG tablet Commonly known as: XANAX Take 1 tablet (0.25 mg total) by mouth daily as needed for anxiety (to last 30 days).   amLODipine 10 MG tablet Commonly known as: NORVASC Take 1 tablet (10 mg total) by mouth daily.   aspirin EC 81 MG tablet Take 1 tablet (81 mg total) by mouth daily.   buPROPion 150 MG 24 hr tablet Commonly known as: WELLBUTRIN XL Take 1 tablet (150 mg total) by mouth daily.   Colchicine 0.6 MG Caps Take 1 capsule by mouth 2 (two) times daily as needed.   diclofenac 50 MG tablet Commonly known as: CATAFLAM Take 1 tablet (50 mg total) by mouth 2 (two) times daily.   fluticasone-salmeterol 100-50 MCG/ACT Aepb Commonly known as: Advair Diskus Inhale 1 puff into the lungs 2 (two) times daily.  *REPLACES BREO ELLIPTA*    losartan 100 MG tablet Commonly known as: COZAAR Take 1 tablet (100 mg total) by mouth daily.   montelukast 10 MG tablet Commonly known as: SINGULAIR Take 1 tablet (10 mg total) by mouth at bedtime.   pantoprazole 40 MG tablet Commonly known as: PROTONIX TAKE 1 TABLET BY MOUTH ONCE A DAY   sildenafil 50 MG tablet Commonly known as: VIAGRA Take 1 tablet (50 mg total) by mouth as needed for erectile dysfunction.   simvastatin 20 MG tablet Commonly known as: ZOCOR TAKE 1 TABLET BY MOUTH AT BEDTIME          Physical Exam: Vitals:   04/24/23 0830  BP: 138/82  Pulse: 70  Temp: (!) 97.1 F (36.2 C)  TempSrc: Temporal  SpO2: 98%  Weight: 236 lb (107 kg)  Height: 6' (1.829 m)   Body mass index is 32.01 kg/m. Wt Readings from Last 3 Encounters:  04/24/23 236 lb (107 kg)  01/19/23 242 lb 3.2 oz (109.9 kg)  01/05/23 240 lb (108.9 kg)    Physical Exam Exam conducted with a chaperone present.  Constitutional:      General: He is not in acute distress.    Appearance: He is well-developed. He is not diaphoretic.  HENT:     Head: Normocephalic and atraumatic.     Right Ear: External ear normal.     Left Ear: External ear normal.     Mouth/Throat:     Pharynx:  No oropharyngeal exudate.  Eyes:     Conjunctiva/sclera: Conjunctivae normal.     Pupils: Pupils are equal, round, and reactive to light.  Cardiovascular:     Rate and Rhythm: Normal rate and regular rhythm.     Heart sounds: Normal heart sounds.  Pulmonary:     Effort: Pulmonary effort is normal.     Breath sounds: Normal breath sounds.  Abdominal:     General: Bowel sounds are normal.     Palpations: Abdomen is soft.  Genitourinary:    Rectum: Normal.  Musculoskeletal:        General: No tenderness.     Cervical back: Normal range of motion and neck supple.     Right lower leg: No edema.     Left lower leg: No edema.  Skin:    General: Skin is warm and dry.  Neurological:     Mental Status: He is  alert and oriented to person, place, and time.     Labs reviewed: Basic Metabolic Panel: Recent Labs    07/18/22 0834 01/16/23 0802  NA 141 142  K 4.6 4.3  CL 104 106  CO2 25 25  GLUCOSE 93 105*  BUN 12 21  CREATININE 1.24 1.29  CALCIUM 10.1 9.4   Liver Function Tests: Recent Labs    07/18/22 0834 01/16/23 0802  AST 33 30  ALT 37 45  BILITOT 0.5 0.3  PROT 7.3 6.8   No results for input(s): "LIPASE", "AMYLASE" in the last 8760 hours. No results for input(s): "AMMONIA" in the last 8760 hours. CBC: Recent Labs    07/18/22 0834 01/16/23 0802  WBC 6.8 6.6  NEUTROABS 4,332 4,000  HGB 15.5 15.0  HCT 45.2 43.6  MCV 92.2 90.6  PLT 340 262   Lipid Panel: Recent Labs    07/18/22 0834 01/16/23 0802  CHOL 141 141  HDL 46 44  LDLCALC 69 73  TRIG 182* 162*  CHOLHDL 3.1 3.2   Lab Results  Component Value Date   HGBA1C 5.8 (H) 01/16/2023    Procedures: No results found.  Assessment/Plan 1. Routine general medical examination at a health care facility -wellness visit completed today, The patient was counseled regarding the appropriate use of alcohol, regular self-examination of the breasts on a monthly basis, prevention of dental and periodontal disease, diet, regular sustained exercise for at least 30 minutes 5 times per week,testicular self-examination on a monthly basis,smoking cessation, tobacco use,  and recommended schedule for GI hemoccult testing, colonoscopy, cholesterol, thyroid and diabetes screening. - PSA; Future - Hemoglobin A1c; Future - CBC with Differential/Platelet; Future - Complete Metabolic Panel with eGFR; Future  2. Need for hepatitis C screening test - Hepatitis C antibody; Future  3. Class 1 obesity due to excess calories without serious comorbidity with body mass index (BMI) of 32.0 to 32.9 in adult --education provided on healthy weight loss through increase in physical activity and proper nutrition   4. Essential  hypertension -Blood pressure well controlled, goal bp <140/90 Continue current medications and dietary modifications follow metabolic panel - EKG 12-Lead showing right bundle branch block-  5. Right bundle branch block Noted on EKG< will have cardiology evaluate - Ambulatory referral to Cardiology   Next appt: as scheduled. Janene Harvey. Biagio Borg  [facility] [phone]

## 2023-04-29 ENCOUNTER — Other Ambulatory Visit: Payer: Self-pay | Admitting: Nurse Practitioner

## 2023-04-29 DIAGNOSIS — Z87891 Personal history of nicotine dependence: Secondary | ICD-10-CM

## 2023-04-30 ENCOUNTER — Other Ambulatory Visit (HOSPITAL_COMMUNITY): Payer: Self-pay

## 2023-04-30 ENCOUNTER — Other Ambulatory Visit: Payer: Self-pay

## 2023-04-30 MED ORDER — BUPROPION HCL ER (XL) 150 MG PO TB24
150.0000 mg | ORAL_TABLET | Freq: Every day | ORAL | 1 refills | Status: DC
Start: 2023-04-30 — End: 2023-05-06
  Filled 2023-04-30: qty 90, 90d supply, fill #0

## 2023-05-04 ENCOUNTER — Encounter: Payer: Self-pay | Admitting: Physician Assistant

## 2023-05-06 ENCOUNTER — Encounter: Payer: Self-pay | Admitting: Adult Health

## 2023-05-06 ENCOUNTER — Other Ambulatory Visit (HOSPITAL_COMMUNITY): Payer: Self-pay

## 2023-05-06 ENCOUNTER — Ambulatory Visit (INDEPENDENT_AMBULATORY_CARE_PROVIDER_SITE_OTHER): Payer: 59 | Admitting: Adult Health

## 2023-05-06 VITALS — BP 127/88 | HR 79 | Temp 97.9°F | Resp 18 | Ht 73.0 in | Wt 236.4 lb

## 2023-05-06 DIAGNOSIS — F419 Anxiety disorder, unspecified: Secondary | ICD-10-CM

## 2023-05-06 DIAGNOSIS — J411 Mucopurulent chronic bronchitis: Secondary | ICD-10-CM | POA: Diagnosis not present

## 2023-05-06 DIAGNOSIS — N529 Male erectile dysfunction, unspecified: Secondary | ICD-10-CM | POA: Diagnosis not present

## 2023-05-06 DIAGNOSIS — I10 Essential (primary) hypertension: Secondary | ICD-10-CM

## 2023-05-06 DIAGNOSIS — M170 Bilateral primary osteoarthritis of knee: Secondary | ICD-10-CM | POA: Diagnosis not present

## 2023-05-06 DIAGNOSIS — F330 Major depressive disorder, recurrent, mild: Secondary | ICD-10-CM

## 2023-05-06 DIAGNOSIS — M109 Gout, unspecified: Secondary | ICD-10-CM | POA: Diagnosis not present

## 2023-05-06 LAB — POC COVID19 BINAXNOW: SARS Coronavirus 2 Ag: NEGATIVE

## 2023-05-06 MED ORDER — BUPROPION HCL ER (XL) 150 MG PO TB24
150.0000 mg | ORAL_TABLET | ORAL | 1 refills | Status: DC
Start: 2023-05-06 — End: 2023-09-07

## 2023-05-06 MED ORDER — ALPRAZOLAM 0.25 MG PO TABS
0.2500 mg | ORAL_TABLET | Freq: Every day | ORAL | 5 refills | Status: DC | PRN
Start: 2023-05-06 — End: 2023-11-24
  Filled 2023-05-06: qty 20, 30d supply, fill #0
  Filled 2023-06-08: qty 20, 30d supply, fill #1
  Filled 2023-07-08: qty 20, 30d supply, fill #2
  Filled 2023-08-12: qty 20, 30d supply, fill #3
  Filled 2023-09-21: qty 20, 30d supply, fill #4
  Filled 2023-10-28: qty 20, 30d supply, fill #5

## 2023-05-06 MED ORDER — SILDENAFIL CITRATE 50 MG PO TABS
50.0000 mg | ORAL_TABLET | ORAL | 1 refills | Status: DC | PRN
Start: 2023-05-06 — End: 2023-07-08
  Filled 2023-05-06: qty 10, 30d supply, fill #0
  Filled 2023-06-08: qty 10, 30d supply, fill #1

## 2023-05-06 MED ORDER — AZITHROMYCIN 250 MG PO TABS
ORAL_TABLET | ORAL | 0 refills | Status: AC
Start: 2023-05-06 — End: 2023-05-11
  Filled 2023-05-06: qty 6, 5d supply, fill #0

## 2023-05-06 NOTE — Progress Notes (Signed)
[facility] clinic  Provider:  Kenard Gower DNP  Code Status:  Full Code  Goals of Care:     05/06/2023    8:32 AM  Advanced Directives  Does Patient Have a Medical Advance Directive? No     Chief Complaint  Patient presents with   Acute Visit    Coughing 3-4 days    HPI: Patient is a 67 y.o. male seen today for an acute visit for coughing X 3-4 days. He has whitish to light greenish phlegm. He denies fever nor chills. He has been taking Mucinex for cough. Tested negative for COVID-19 rapid test.  BP 127/88, takes Amlodipine and losartan for hypertension.  He denies having gout flares, takes Allopurinol for gout, drinks 2 oz of bourbon every night  He denies having knee pain, currently takes Diclofenac for osteoarthritis  He stated that he has mild depression since his uncle recently died and daughter is sick with lupus, takes Wellbutrin for depression    Past Medical History:  Diagnosis Date   Abdominal aortic aneurysm [facility])    sees Dr. Jacinto Halim for cardiac f/u, [phone]   Arthritis    lower back   Asthma    followed by Dr. Samara Snide   Chronic airway obstruction, not elsewhere classified    Dyspnea    normal PFT April 2012   External hemorrhoids without mention of complication    GERD (gastroesophageal reflux disease)    Gout    H/O hiatal hernia    Hemorrhoids    HTN (hypertension)    hx of sees Dr. Alonna Minium   Hyperlipidemia    Insomnia, unspecified    Other abnormal blood chemistry    Other dyspnea and respiratory abnormality    Other malaise and fatigue    Other specified erythematous condition(695.89)    Other testicular dysfunction    Prostatitis, unspecified    Rash 2011   left chest, biopsy 2012 Dr. Charlton Haws, results pending   Skin cancer    Squamous cell carcinoma of skin 11/03/2014   right shoulder cx3 31fu   Tobacco abuse     Past Surgical History:  Procedure Laterality Date   ABDOMINAL AORTIC ENDOVASCULAR STENT GRAFT  09/22/2012    Procedure: ABDOMINAL AORTIC ENDOVASCULAR STENT GRAFT;  Surgeon: Pryor Ochoa, MD;  Location: [facility] OR;  Service: Vascular;  Laterality: N/A;  GORE; ultrasound guided.   APPENDECTOMY     EVALUATION UNDER ANESTHESIA WITH HEMORRHOIDECTOMY N/A 12/31/2016   Procedure: EXAM UNDER ANESTHESIA WITH HEMORRHOIDECTOMY;  Surgeon: [name], MD;  Location: [facility];  Service: General;  Laterality: N/A;   SKIN CANCER EXCISION     TOE SURGERY  01/2012   joint of left great toe   TONSILLECTOMY     TONSILLECTOMY     TYMPANOPLASTY     WRIST SURGERY     left    No Known Allergies   Outpatient Encounter Medications as of 05/06/2023  Medication Sig   acetaminophen (TYLENOL) 500 MG tablet Take 2 tablets (1,000 mg total) by mouth every 6 (six) hours as needed.   albuterol (VENTOLIN HFA) 108 (90 Base) MCG/ACT inhaler Inhale 2 puffs into the lungs every 6 (six) hours as needed for cough and wheezing.   allopurinol (ZYLOPRIM) 100 MG tablet Take 1 tablet (100 mg total) by mouth daily.   ALPRAZolam (XANAX) 0.25 MG tablet Take 1 tablet (0.25 mg total) by mouth daily as needed for anxiety (to last 30 days).   amLODipine (NORVASC) 10 MG tablet  Take 1 tablet (10 mg total) by mouth daily.   aspirin EC 81 MG tablet Take 1 tablet (81 mg total) by mouth daily.   buPROPion (WELLBUTRIN XL) 150 MG 24 hr tablet Take 1 tablet (150 mg total) by mouth daily.   Colchicine 0.6 MG CAPS Take 1 capsule by mouth 2 (two) times daily as needed.   diclofenac (CATAFLAM) 50 MG tablet Take 1 tablet (50 mg total) by mouth 2 (two) times daily.   fluticasone-salmeterol (ADVAIR DISKUS) 100-50 MCG/ACT AEPB Inhale 1 puff into the lungs 2 (two) times daily.  REPLACES BREO ELLIPTA   losartan (COZAAR) 100 MG tablet Take 1 tablet (100 mg total) by mouth daily.   montelukast (SINGULAIR) 10 MG tablet Take 1 tablet (10 mg total) by mouth at bedtime.   pantoprazole (PROTONIX) 40 MG tablet TAKE 1 TABLET BY MOUTH ONCE A DAY    sildenafil (VIAGRA) 50 MG tablet Take 1 tablet (50 mg total) by mouth as needed for erectile dysfunction.   simvastatin (ZOCOR) 20 MG tablet TAKE 1 TABLET BY MOUTH AT BEDTIME   No facility-administered encounter medications on file as of 05/06/2023.    Review of Systems:  Review of Systems  Constitutional:  Negative for activity change, appetite change and fever.  HENT:  Negative for sore throat.   Eyes: Negative.   Respiratory:  Positive for cough.   Cardiovascular:  Negative for chest pain and leg swelling.  Gastrointestinal:  Negative for abdominal distention, diarrhea and vomiting.  Genitourinary:  Negative for dysuria, frequency and urgency.  Skin:  Negative for color change.  Neurological:  Negative for dizziness and headaches.  Psychiatric/Behavioral:  Negative for behavioral problems and sleep disturbance. The patient is not nervous/anxious.     Health Maintenance  Topic Date Due   INFLUENZA VACCINE  06/25/2023   DTaP/Tdap/Td (2 - Td or Tdap) 12/07/2023   Colonoscopy  10/03/2026   Zoster Vaccines- Shingrix  Completed   HPV VACCINES  Aged Out   COVID-19 Vaccine  Discontinued   Hepatitis C Screening  Discontinued   HIV Screening  Discontinued    Physical Exam: Vitals:   05/06/23 0942  BP: 127/88  Pulse: 79  Resp: 18  Temp: 97.9 F (36.6 C)  SpO2: 93%  Weight: 236 lb 6.4 oz (107.2 kg)  Height: 6\' 1"  (1.854 m)   Body mass index is 31.19 kg/m. Physical Exam Constitutional:      Appearance: He is obese.  HENT:     Head: Normocephalic and atraumatic.     Mouth/Throat:     Mouth: Mucous membranes are moist.  Eyes:     Conjunctiva/sclera: Conjunctivae normal.  Cardiovascular:     Rate and Rhythm: Normal rate and regular rhythm.     Pulses: Normal pulses.     Heart sounds: Normal heart sounds.  Pulmonary:     Effort: Pulmonary effort is normal.     Breath sounds: Normal breath sounds.  Abdominal:     General: Bowel sounds are normal.     Palpations:  Abdomen is soft.  Musculoskeletal:        General: No swelling. Normal range of motion.     Cervical back: Normal range of motion.  Skin:    General: Skin is warm and dry.  Neurological:     General: No focal deficit present.     Mental Status: He is alert and oriented to person, place, and time.  Psychiatric:        Mood and Affect: Mood  normal.        Behavior: Behavior normal.        Thought Content: Thought content normal.        Judgment: Judgment normal.     Labs reviewed: Basic Metabolic Panel: Recent Labs    07/18/22 0834 01/16/23 0802  NA 141 142  K 4.6 4.3  CL 104 106  CO2 25 25  GLUCOSE 93 105*  BUN 12 21  CREATININE 1.24 1.29  CALCIUM 10.1 9.4   Liver Function Tests: Recent Labs    07/18/22 0834 01/16/23 0802  AST 33 30  ALT 37 45  BILITOT 0.5 0.3  PROT 7.3 6.8   No results for input(s): "LIPASE", "AMYLASE" in the last 8760 hours. No results for input(s): "AMMONIA" in the last 8760 hours. CBC: Recent Labs    07/18/22 0834 01/16/23 0802  WBC 6.8 6.6  NEUTROABS 4,332 4,000  HGB 15.5 15.0  HCT 45.2 43.6  MCV 92.2 90.6  PLT 340 262   Lipid Panel: Recent Labs    07/18/22 0834 01/16/23 0802  CHOL 141 141  HDL 46 44  LDLCALC 69 73  TRIG 182* 162*  CHOLHDL 3.1 3.2   Lab Results  Component Value Date   HGBA1C 5.8 (H) 01/16/2023    Procedures since last visit: No results found.  Assessment/Plan  1. Chronic bronchitis with productive mucopurulent cough (HCC) -  continue Mucinex PRN and Advair - azithromycin (ZITHROMAX) 250 MG tablet; Take 2 tablets on day 1, then 1 tablet daily on days 2 through 5  Dispense: 6 tablet; Refill: 0  2. Essential hypertension -  BP 127/88, stable -  continue Amlodipine and Losartan  3. Gout, unspecified cause, unspecified chronicity, unspecified site -  denies gout flare ups -  continue Allopurinol -  encouraged to cut on alcohol intake  4. Primary osteoarthritis of both knees -  plans to cut down  on Diclofenac  5. Mild episode of recurrent major depressive disorder (HCC) - buPROPion (WELLBUTRIN XL) 150 MG 24 hr tablet; Take 1 tablet (150 mg total) by mouth every other day.  Dispense: 120 tablet; Refill: 1  6. Anxiety - ALPRAZolam (XANAX) 0.25 MG tablet; Take 1 tablet (0.25 mg total) by mouth daily as needed for anxiety (to last 30 days).  Dispense: 20 tablet; Refill: 5  7. Erectile dysfunction, unspecified erectile dysfunction type - sildenafil (VIAGRA) 50 MG tablet; Take 1 tablet (50 mg total) by mouth as needed for erectile dysfunction.  Dispense: 10 tablet; Refill: 1   Labs/tests ordered:  COVID-19 rapid test  Next appt:  07/20/2023

## 2023-05-11 ENCOUNTER — Telehealth: Payer: Self-pay

## 2023-05-11 NOTE — Telephone Encounter (Signed)
Patient called stating that he was given zpak last week,but is still having cough. He states that he has taking mucinex. He thinks he made need some prednisone based on his history of bronchitis. Please advise.  Message sent to Abbey Chatters, NP

## 2023-05-12 NOTE — Telephone Encounter (Signed)
Symptoms can persist after azithromycin course is over but the medication continues to work up to 10 days.  Would use mucinex DM by mouth with full glass of water. If still having symptoms after another week to notify if symptoms worsen, shortness of breath, fever/chills to make appt to be seen.

## 2023-05-12 NOTE — Telephone Encounter (Signed)
Spoke with patient and discussed response from Abbey Chatters, NP. He verbalized his understanding and agreed.

## 2023-05-14 ENCOUNTER — Encounter: Payer: Self-pay | Admitting: Cardiology

## 2023-05-14 ENCOUNTER — Ambulatory Visit: Payer: 59 | Admitting: Cardiology

## 2023-05-14 VITALS — BP 126/70 | HR 63 | Ht 73.0 in | Wt 236.6 lb

## 2023-05-14 DIAGNOSIS — E7800 Pure hypercholesterolemia, unspecified: Secondary | ICD-10-CM | POA: Diagnosis not present

## 2023-05-14 DIAGNOSIS — R9431 Abnormal electrocardiogram [ECG] [EKG]: Secondary | ICD-10-CM

## 2023-05-14 DIAGNOSIS — I2510 Atherosclerotic heart disease of native coronary artery without angina pectoris: Secondary | ICD-10-CM

## 2023-05-14 DIAGNOSIS — Z95828 Presence of other vascular implants and grafts: Secondary | ICD-10-CM | POA: Diagnosis not present

## 2023-05-14 DIAGNOSIS — I10 Essential (primary) hypertension: Secondary | ICD-10-CM

## 2023-05-14 NOTE — Progress Notes (Signed)
Primary Physician/Referring:  [name], NP  Patient ID: Aaron [name], male    DOB: 1956-06-11, 67 y.o.   MRN: [id number]  Chief Complaint  Patient presents with   Abnormal ECG   New Patient (Initial Visit)   HPI:    Aaron [name]  is a 67 y.o. Patient with hypertension, hypercholesterolemia, COPD, abdominal aortic aneurysm SP EVAR in 2013, I had seen him remotely not referred to me for evaluation of abnormal EKG. he is asymptomatic.  Past Medical History:  Diagnosis Date   Abdominal aortic aneurysm (AAA) without rupture (HCC) 11/01/2013   Abdominal aortic aneurysm [facility])    sees Dr. Jacinto Halim for cardiac f/u, [phone]   Arthritis    lower back   Asthma    followed by Dr. Samara Snide   Chronic airway obstruction, not elsewhere classified    Dyspnea    normal PFT April 2012   External hemorrhoids without mention of complication    GERD (gastroesophageal reflux disease)    Gout    H/O hiatal hernia    Hemorrhoids    HTN (hypertension)    hx of sees Dr. Alonna Minium   Hyperlipidemia    Insomnia, unspecified    Other abnormal blood chemistry    Other dyspnea and respiratory abnormality    Other malaise and fatigue    Other specified erythematous condition(695.89)    Other testicular dysfunction    Primary hypertension 01/20/2011   Prostatitis, unspecified    Rash 2011   left chest, biopsy 2012 Dr. Charlton Haws, results pending   Skin cancer    Squamous cell carcinoma of skin 11/03/2014   right shoulder cx3 3fu   Tobacco abuse    Past Surgical History:  Procedure Laterality Date   ABDOMINAL AORTIC ENDOVASCULAR STENT GRAFT  09/22/2012   Procedure: ABDOMINAL AORTIC ENDOVASCULAR STENT GRAFT;  Surgeon: Pryor Ochoa, MD;  Location: [facility] OR;  Service: Vascular;  Laterality: N/A;  GORE; ultrasound guided.   APPENDECTOMY     EVALUATION UNDER ANESTHESIA WITH HEMORRHOIDECTOMY N/A 12/31/2016   Procedure: EXAM UNDER ANESTHESIA WITH HEMORRHOIDECTOMY;  Surgeon:  [name], MD;  Location: [facility];  Service: General;  Laterality: N/A;   SKIN CANCER EXCISION     TOE SURGERY  01/2012   joint of left great toe   TONSILLECTOMY     TONSILLECTOMY     TYMPANOPLASTY     WRIST SURGERY     left   Family History  Problem Relation Age of Onset   Cancer Mother        Breast   Heart attack Father 59   COPD Father    Heart disease Father    Osler-Weber-Rendu syndrome Brother    Asthma Daughter    Lupus Daughter    Diabetes Other        Grandson    Colon cancer Neg Hx    Pancreatic cancer Neg Hx    Esophageal cancer Neg Hx    Stomach cancer Neg Hx    Rectal cancer Neg Hx     Social History   Tobacco Use   Smoking status: Former    Packs/day: 0.25    Years: 38.00    Additional pack years: 0.00    Total pack years: 9.50    Types: Cigarettes    Quit date: 10/31/2020    Years since quitting: 2.5    Passive exposure: Never   Smokeless tobacco: Former    Types: Snuff    Quit date:  11/25/2011  Substance Use Topics   Alcohol use: Yes    Alcohol/week: 0.0 standard drinks of alcohol    Comment: occ   Marital Status: Married  ROS  Review of Systems  Cardiovascular:  Negative for chest pain, dyspnea on exertion and leg swelling.   Objective      05/14/2023   10:09 AM 05/06/2023    9:42 AM 04/24/2023    8:30 AM  Vitals with BMI  Height 6\' 1"  6\' 1"  6\' 0"   Weight 236 lbs 10 oz 236 lbs 6 oz 236 lbs  BMI 31.22 31.2 32  Systolic 126 127 161  Diastolic 70 88 82  Pulse 63 79 70   Blood pressure 126/70, pulse 63, height 6\' 1"  (1.854 m), weight 236 lb 9.6 oz (107.3 kg), SpO2 94 %.   Physical Exam Neck:     Vascular: No carotid bruit or JVD.  Cardiovascular:     Rate and Rhythm: Normal rate and regular rhythm.     Pulses: Intact distal pulses.     Heart sounds: Normal heart sounds. No murmur heard.    No gallop.  Pulmonary:     Effort: Pulmonary effort is normal.     Breath sounds: Normal breath sounds.  Abdominal:      General: Bowel sounds are normal.     Palpations: Abdomen is soft.  Musculoskeletal:     Right lower leg: No edema.     Left lower leg: No edema.     Laboratory examination:   Recent Labs    07/18/22 0834 01/16/23 0802  NA 141 142  K 4.6 4.3  CL 104 106  CO2 25 25  GLUCOSE 93 105*  BUN 12 21  CREATININE 1.24 1.29  CALCIUM 10.1 9.4    Lab Results  Component Value Date   GLUCOSE 105 (H) 01/16/2023   NA 142 01/16/2023   K 4.3 01/16/2023   CL 106 01/16/2023   CO2 25 01/16/2023   BUN 21 01/16/2023   CREATININE 1.29 01/16/2023   EGFR 62 01/16/2023   CALCIUM 9.4 01/16/2023   PROT 6.8 01/16/2023   ALBUMIN 3.6 10/01/2020   LABGLOB 2.6 08/09/2015   AGRATIO 1.7 08/09/2015   BILITOT 0.3 01/16/2023   ALKPHOS 61 10/01/2020   AST 30 01/16/2023   ALT 45 01/16/2023   ANIONGAP 12 10/02/2020      Lab Results  Component Value Date   ALT 45 01/16/2023   AST 30 01/16/2023   ALKPHOS 61 10/01/2020   BILITOT 0.3 01/16/2023       Latest Ref Rng & Units 01/16/2023    8:02 AM 07/18/2022    8:34 AM 02/17/2022    9:11 AM  CBC  WBC 3.8 - 10.8 Thousand/uL 6.6  6.8  7.3   Hemoglobin 13.2 - 17.1 g/dL 09.6  04.5  40.9   Hematocrit 38.5 - 50.0 % 43.6  45.2  45.6   Platelets 140 - 400 Thousand/uL 262  340  278        Latest Ref Rng & Units 01/16/2023    8:02 AM 07/18/2022    8:34 AM 02/17/2022    9:11 AM  Hepatic Function  Total Protein 6.1 - 8.1 g/dL 6.8  7.3  6.7   AST 10 - 35 U/L 30  33  25   ALT 9 - 46 U/L 45  37  25   Total Bilirubin 0.2 - 1.2 mg/dL 0.3  0.5  0.4     Lipid Panel Recent  Labs    07/18/22 0834 01/16/23 0802  CHOL 141 141  TRIG 182* 162*  LDLCALC 69 73  HDL 46 44  CHOLHDL 3.1 3.2    HEMOGLOBIN E4V Lab Results  Component Value Date   HGBA1C 5.8 (H) 01/16/2023   MPG 108 09/18/2020   TSH Lab Results  Component Value Date   TSH 2.24 12/24/2016    Radiology:   CT angiogram of the abdomen pelvis 08/12/2019 Aorta: Status post endovascular  repair of infrarenal abdominal aortic aneurysm with aorto iliac stent graft. Graft is widely patent. Small amount of eccentric mural thrombus at the proximal end of the stent graft and left common iliac portion of the graft. Residual native aneurysm sac measuring 2.8 x 3.1 cm, previously 2.9 x 3.2 x 3 cm. Mild aneurysmal dilatation at the proximal end of the stent graft measures 3.6 x 3.6 cm, previously 3.6 x 3.7 x 3.7 cm.  CT angiogram chest 10/01/2020: 1. Negative for acute pulmonary embolus. 2. Emphysema (ICD10-J43.9) with no acute pulmonary finding. Several small lung nodules are stable since 2018 and benign. 3. Calcified coronary artery atherosclerosis. 4. Hepatic steatosis.  Chest x-ray 01/19/2023: The cardiomediastinal silhouette is unchanged in contour. No focal pulmonary opacity. No pleural effusion or pneumothorax. The visualized upper abdomen is unremarkable. No acute osseous abnormality  Cardiac Studies:   Lexiscan nuclear stress test 09/17/2012: Nondiagnostic stress EKG due to pharmacologic stress. Normal perfusion without ischemia, LVEF 62%.  Abdominal Aortic Duplex 03/31/2022: Abdominal Aorta: Patent endovascular aneurysm repair with no evidence of endoleak.   EKG:   EKG 05/14/2023: Normal sinus rhythm at rate of 60 bpm, PR interval 200 ms, left atrial enlargement, rightward axis, right bundle branch block.  Low-voltage complexes, consider RVH/pulmonary disease pattern.   EKG 04/24/2023: Sinus rhythm with first-degree AV block at rate of 62 bpm.  Left atrial enlargement.  Normal axis.  Right bundle branch block.  Bifascicular block.  Low-voltage complexes, consider pulmonary disease pattern.  Compared to 08/30/2018, first-degree AV block is new and incomplete right bundle is complete. Medications and allergies  No Known Allergies   Medication list   Current Outpatient Medications:    acetaminophen (TYLENOL) 500 MG tablet, Take 2 tablets (1,000 mg total) by mouth every 6  (six) hours as needed., Disp: 30 tablet, Rfl: 5   albuterol (VENTOLIN HFA) 108 (90 Base) MCG/ACT inhaler, Inhale 2 puffs into the lungs every 6 (six) hours as needed for cough and wheezing., Disp: 20.1 g, Rfl: 3   allopurinol (ZYLOPRIM) 100 MG tablet, Take 1 tablet (100 mg total) by mouth daily., Disp: 90 tablet, Rfl: 3   ALPRAZolam (XANAX) 0.25 MG tablet, Take 1 tablet (0.25 mg) by mouth daily as needed for anxiety (to last 30 days)., Disp: 20 tablet, Rfl: 5   amLODipine (NORVASC) 10 MG tablet, Take 1 tablet (10 mg total) by mouth daily., Disp: 90 tablet, Rfl: 1   aspirin EC 81 MG tablet, Take 1 tablet (81 mg total) by mouth daily., Disp: 30 tablet, Rfl: 5   buPROPion (WELLBUTRIN XL) 150 MG 24 hr tablet, Take 1 tablet (150 mg total) by mouth every other day., Disp: 120 tablet, Rfl: 1   diclofenac (CATAFLAM) 50 MG tablet, Take 1 tablet (50 mg total) by mouth 2 (two) times daily., Disp: 60 tablet, Rfl: 2   fluticasone-salmeterol (ADVAIR) 100-50 MCG/ACT AEPB, Inhale 1 puff into the lungs 2 (two) times daily., Disp: , Rfl:    losartan (COZAAR) 100 MG tablet, Take 1 tablet (100 mg  total) by mouth daily., Disp: 90 tablet, Rfl: 3   montelukast (SINGULAIR) 10 MG tablet, Take 1 tablet (10 mg total) by mouth at bedtime., Disp: 30 tablet, Rfl: 3   pantoprazole (PROTONIX) 40 MG tablet, TAKE 1 TABLET BY MOUTH ONCE A DAY, Disp: 90 tablet, Rfl: 3   sildenafil (VIAGRA) 50 MG tablet, Take 1 tablet (50 mg) by mouth as needed for erectile dysfunction., Disp: 10 tablet, Rfl: 1   simvastatin (ZOCOR) 20 MG tablet, TAKE 1 TABLET BY MOUTH AT BEDTIME, Disp: 90 tablet, Rfl: 3  Assessment     ICD-10-CM   1. Nonspecific abnormal electrocardiogram (ECG) (EKG)  R94.31 EKG 12-Lead    2. History of repair of aneurysm of abdominal aorta using endovascular stent graft  Z95.828     3. Primary hypertension  I10     4. Pure hypercholesterolemia  E78.00     5. Coronary artery calcification seen on CAT scan  I25.10         Orders Placed This Encounter  Procedures   EKG 12-Lead   No orders of the defined types were placed in this encounter.  Medications Discontinued During This Encounter  Medication Reason   fluticasone-salmeterol (ADVAIR DISKUS) 100-50 MCG/ACT AEPB Not covered by the pt's insurance     Recommendations:   Aaron [name] is a 67 y.o. Patient with hypertension, hypercholesterolemia, COPD, abdominal aortic aneurysm SP EVAR in 2013, I had seen him remotely not referred to me for evaluation of abnormal EKG.  1. Nonspecific abnormal electrocardiogram (ECG) (EKG) Patient has underlying right bundle branch block, patient is completely asymptomatic and is able to do all his household chores and also able to walk in the pool for 15 to 20 minutes without any chest pain or dyspnea, no dizziness or syncope, no further evaluation is indicated. - EKG 12-Lead  2. History of repair of aneurysm of abdominal aorta using endovascular stent graft Patient has had abdominal aortic aneurysm repair, this is being closely followed by vascular surgery.  Recent aortic duplex reviewed.  Stable.  3. Primary hypertension Patient is on amlodipine and losartan which are excellent choices especially in view of AAA.  Continue the same, blood pressure is under good control.  4. Pure hypercholesterolemia He is presently being treated with high intensity statin, LDL is at goal at <70 however his triglycerides are mildly elevated.  We have discussed regarding avoidance of red meats and also excess calories and also to decrease starch intake.  I suspect this itself will improve his triglycerides.  I would not recommend addition of any other medications for this.  5. Coronary artery calcification seen on CAT scan Patient has coronary artery calcification noted on the CT scans that were done in the past.  However although he has coronary atherosclerosis, he is on appropriate medical therapy with excellent control of his  risk factors.  He needs to lose weight and continue to exercise by walking at least 30 minutes every day or swimming in the pool for 20 minutes a day.  Also dietary changes have been discussed in detail.  As patient is asymptomatic, no further evaluation is indicated from cardiac standpoint I will see him back on a as needed basis.  He is on guideline directed medical therapy.  Other orders - fluticasone-salmeterol (ADVAIR) 100-50 MCG/ACT AEPB; Inhale 1 puff into the lungs 2 (two) times daily.     [name], MD, [facility] 05/14/2023, 11:14 AM Office: [phone]

## 2023-06-02 ENCOUNTER — Other Ambulatory Visit (HOSPITAL_COMMUNITY): Payer: Self-pay

## 2023-06-02 ENCOUNTER — Other Ambulatory Visit: Payer: Self-pay | Admitting: Nurse Practitioner

## 2023-06-02 DIAGNOSIS — E782 Mixed hyperlipidemia: Secondary | ICD-10-CM

## 2023-06-02 MED ORDER — SIMVASTATIN 20 MG PO TABS
20.0000 mg | ORAL_TABLET | Freq: Every day | ORAL | 3 refills | Status: DC
Start: 2023-06-02 — End: 2024-05-30
  Filled 2023-06-02: qty 90, 90d supply, fill #0
  Filled 2023-08-27: qty 90, 90d supply, fill #1
  Filled 2023-11-24 – 2023-11-27 (×2): qty 90, 90d supply, fill #2
  Filled 2024-02-24: qty 90, 90d supply, fill #3

## 2023-06-02 MED ORDER — FLUTICASONE-SALMETEROL 100-50 MCG/ACT IN AEPB
1.0000 | INHALATION_SPRAY | Freq: Two times a day (BID) | RESPIRATORY_TRACT | 5 refills | Status: DC
  Filled 2023-06-02: qty 60, 30d supply, fill #0

## 2023-06-02 NOTE — Telephone Encounter (Signed)
High Risk Warning Populated when attempting to refill, I will send to Provider for further review 

## 2023-06-03 ENCOUNTER — Other Ambulatory Visit: Payer: Self-pay

## 2023-06-08 ENCOUNTER — Other Ambulatory Visit: Payer: Self-pay

## 2023-06-08 ENCOUNTER — Other Ambulatory Visit (HOSPITAL_COMMUNITY): Payer: Self-pay

## 2023-06-16 ENCOUNTER — Ambulatory Visit (INDEPENDENT_AMBULATORY_CARE_PROVIDER_SITE_OTHER): Payer: 59 | Admitting: Physician Assistant

## 2023-06-16 ENCOUNTER — Encounter: Payer: Self-pay | Admitting: Physician Assistant

## 2023-06-16 VITALS — BP 126/74 | HR 85 | Ht 73.0 in | Wt 234.2 lb

## 2023-06-16 DIAGNOSIS — Z86010 Personal history of colonic polyps: Secondary | ICD-10-CM | POA: Diagnosis not present

## 2023-06-16 DIAGNOSIS — R197 Diarrhea, unspecified: Secondary | ICD-10-CM

## 2023-06-16 NOTE — Patient Instructions (Signed)
Start daily fiber supplement.   _______________________________________________________  If your blood pressure at your visit was 140/90 or greater, please contact your primary care physician to follow up on this.  _______________________________________________________  If you are age 65 or older, your body mass index should be between 23-30. Your Body mass index is 30.9 kg/m. If this is out of the aforementioned range listed, please consider follow up with your Primary Care Provider.  If you are age 49 or younger, your body mass index should be between 19-25. Your Body mass index is 30.9 kg/m. If this is out of the aformentioned range listed, please consider follow up with your Primary Care Provider.   ________________________________________________________  The [facility] providers would like to encourage you to use [facility] to communicate with providers for non-urgent requests or questions.  Due to long hold times on the telephone, sending your provider a message by [facility] may be a faster and more efficient way to get a response.  Please allow 48 business hours for a response.  Please remember that this is for non-urgent requests.  _______________________________________________________

## 2023-06-16 NOTE — Progress Notes (Signed)
Chief Complaint: Diarrhea  HPI:    Aaron [name] is a  67 y/o male with a past medical history as listed below including GERD, hypertension and multiple others, known to Dr. Lavon Paganini, who was referred to me by [name], NP for a complaint of diarrhea.      10/04/2019 EGD for epigastric abdominal pain with gastritis and clear gastric fluid as well as gastric stenosis at the pylorus.  At that point discussed repeat upper endoscopy as needed for retreatment/dilation of the pyloric channel.  Pathology showed reactive gastropathy.  Colonoscopy on the same day with three 1-2 mm polyps in the rectum and transverse colon as well as one 7 mm polyp in the transverse colon and mild diverticulosis in the sigmoid, transverse and ascending colon.  Also nonbleeding internal hemorrhoids.  Recommend 3 to 5 years.  Pathology showed tubular adenoma.  Letter was sent for repeat in 7 years.    01/16/2023 CBC and CMP normal.    05/06/2023 COVID testing negative.    05/14/2023 patient followed with cardiology.  At that time discussed right bundle branch block.  Patient was asymptomatic.  Discussed repair of abdominal aortic aneurysm that had previously been done.    Today, patient presents to clinic and tells me that for years and years as long as he can remember he will oftentimes have a solid stool in the morning followed by a loose stool maybe 10 to 15 minutes later.  This has not changed recently and typically does not bother him.  In between he will have other solid stools.  This occurs maybe 2 or 3 days out of the week.  No abdominal pain or bleeding or abnormal weight loss.  He thinks it is related to what he eats or drinks, if he has mixed drinks in the evening he will have more diarrhea in the morning.    Denies fever, chills, weight loss, blood in his stool, nausea, vomiting or symptoms that awaken him from sleep.  Past Medical History:  Diagnosis Date   Abdominal aortic aneurysm (AAA) without rupture (HCC)  11/01/2013   Abdominal aortic aneurysm [facility])    sees Dr. Jacinto Halim for cardiac f/u, [phone]   Arthritis    lower back   Asthma    followed by Dr. Samara Snide   Chronic airway obstruction, not elsewhere classified    Dyspnea    normal PFT April 2012   External hemorrhoids without mention of complication    GERD (gastroesophageal reflux disease)    Gout    H/O hiatal hernia    Hemorrhoids    HTN (hypertension)    hx of sees Dr. Alonna Minium   Hyperlipidemia    Insomnia, unspecified    Other abnormal blood chemistry    Other dyspnea and respiratory abnormality    Other malaise and fatigue    Other specified erythematous condition(695.89)    Other testicular dysfunction    Primary hypertension 01/20/2011   Prostatitis, unspecified    Rash 2011   left chest, biopsy 2012 Dr. Charlton Haws, results pending   Skin cancer    Squamous cell carcinoma of skin 11/03/2014   right shoulder cx3 64fu   Tobacco abuse     Past Surgical History:  Procedure Laterality Date   ABDOMINAL AORTIC ENDOVASCULAR STENT GRAFT  09/22/2012   Procedure: ABDOMINAL AORTIC ENDOVASCULAR STENT GRAFT;  Surgeon: Pryor Ochoa, MD;  Location: [facility] OR;  Service: Vascular;  Laterality: N/A;  GORE; ultrasound guided.   APPENDECTOMY  EVALUATION UNDER ANESTHESIA WITH HEMORRHOIDECTOMY N/A 12/31/2016   Procedure: EXAM UNDER ANESTHESIA WITH HEMORRHOIDECTOMY;  Surgeon: [name], MD;  Location: [facility];  Service: General;  Laterality: N/A;   SKIN CANCER EXCISION     TOE SURGERY  01/2012   joint of left great toe   TONSILLECTOMY     TONSILLECTOMY     TYMPANOPLASTY     WRIST SURGERY     left    Current Outpatient Medications on File Prior to Visit  Medication Sig Dispense Refill   acetaminophen (TYLENOL) 500 MG tablet Take 2 tablets (1,000 mg total) by mouth every 6 (six) hours as needed. 30 tablet 5   albuterol (VENTOLIN HFA) 108 (90 Base) MCG/ACT inhaler Inhale 2 puffs into the lungs every 6  (six) hours as needed for cough and wheezing. 20.1 g 3   allopurinol (ZYLOPRIM) 100 MG tablet Take 1 tablet (100 mg total) by mouth daily. 90 tablet 3   ALPRAZolam (XANAX) 0.25 MG tablet Take 1 tablet (0.25 mg) by mouth daily as needed for anxiety (to last 30 days). 20 tablet 5   amLODipine (NORVASC) 10 MG tablet Take 1 tablet (10 mg total) by mouth daily. 90 tablet 1   aspirin EC 81 MG tablet Take 1 tablet (81 mg total) by mouth daily. 30 tablet 5   buPROPion (WELLBUTRIN XL) 150 MG 24 hr tablet Take 1 tablet (150 mg total) by mouth every other day. 120 tablet 1   diclofenac (CATAFLAM) 50 MG tablet Take 1 tablet (50 mg total) by mouth 2 (two) times daily. 60 tablet 2   losartan (COZAAR) 100 MG tablet Take 1 tablet (100 mg total) by mouth daily. 90 tablet 3   montelukast (SINGULAIR) 10 MG tablet Take 1 tablet (10 mg total) by mouth at bedtime. 30 tablet 3   pantoprazole (PROTONIX) 40 MG tablet TAKE 1 TABLET BY MOUTH ONCE A DAY 90 tablet 3   sildenafil (VIAGRA) 50 MG tablet Take 1 tablet (50 mg) by mouth as needed for erectile dysfunction. 10 tablet 1   simvastatin (ZOCOR) 20 MG tablet Take 1 tablet (20 mg total) by mouth at bedtime. 90 tablet 3   No current facility-administered medications on file prior to visit.     Allergies as of 06/16/2023   (No Known Allergies)    Family History  Problem Relation Age of Onset   Cancer Mother        Breast   Heart attack Father 22   COPD Father    Heart disease Father    Osler-Weber-Rendu syndrome Brother    Asthma Daughter    Lupus Daughter    Diabetes Other        Grandson    Colon cancer Neg Hx    Pancreatic cancer Neg Hx    Esophageal cancer Neg Hx    Stomach cancer Neg Hx    Rectal cancer Neg Hx     Social History   Socioeconomic History   Marital status: Married    Spouse name: Not on file   Number of children: Not on file   Years of education: Not on file   Highest education level: Not on file  Occupational History    Occupation: CNA    Employer: [employer]  Tobacco Use   Smoking status: Former    Current packs/day: 0.00    Average packs/day: 0.3 packs/day for 38.0 years (9.5 ttl pk-yrs)    Types: Cigarettes    Start date: 10/31/1982  Quit date: 10/31/2020    Years since quitting: 2.6    Passive exposure: Never   Smokeless tobacco: Former    Types: Snuff    Quit date: 11/25/2011  Vaping Use   Vaping status: Never Used  Substance and Sexual Activity   Alcohol use: Yes    Alcohol/week: 0.0 standard drinks of alcohol    Comment: occ   Drug use: No   Sexual activity: Not on file  Other Topics Concern   Not on file  Social History Narrative   Not on file   Social Determinants of Health   Financial Resource Strain: Not on file  Food Insecurity: Not on file  Transportation Needs: Not on file  Physical Activity: Not on file  Stress: Not on file  Social Connections: Not on file  Intimate Partner Violence: Not on file    Review of Systems:    Constitutional: No weight loss, fever or chills Skin: No rash Cardiovascular: No chest pain Respiratory: No SOB  Gastrointestinal: See HPI and otherwise negative Genitourinary: No dysuria  Neurological: No headache, dizziness or syncope Musculoskeletal: No new muscle or joint pain Hematologic: No bleeding  Psychiatric: No history of depression or anxiety   Physical Exam:  Vital signs: BP 126/74   Pulse 85   Ht 6\' 1"  (1.854 m)   Wt 234 lb 3.2 oz (106.2 kg)   BMI 30.90 kg/m    Constitutional:   Pleasant Caucasian male appears to be in NAD, Well developed, Well nourished, alert and cooperative Head:  Normocephalic and atraumatic. Eyes:   PEERL, EOMI. No icterus. Conjunctiva pink. Ears:  Normal auditory acuity. Neck:  Supple Throat: Oral cavity and pharynx without inflammation, swelling or lesion.  Respiratory: Respirations even and unlabored. Lungs clear to auscultation bilaterally.   No wheezes, crackles, or rhonchi.  Cardiovascular:  Normal S1, S2. No MRG. Regular rate and rhythm. No peripheral edema, cyanosis or pallor.  Gastrointestinal:  Soft, nondistended, nontender. No rebound or guarding. Normal bowel sounds. No appreciable masses or hepatomegaly. Rectal:  Not performed.  Msk:  Symmetrical without gross deformities. Without edema, no deformity or joint abnormality.  Neurologic:  Alert and  oriented x4;  grossly normal neurologically.  Skin:   Dry and intact without significant lesions or rashes. Psychiatric: Demonstrates good judgement and reason without abnormal affect or behaviors.  RELEVANT LABS AND IMAGING: CBC    Component Value Date/Time   WBC 6.6 01/16/2023 0802   RBC 4.81 01/16/2023 0802   HGB 15.0 01/16/2023 0802   HGB 14.2 08/09/2015 1642   HCT 43.6 01/16/2023 0802   HCT 41.3 08/09/2015 1642   PLT 262 01/16/2023 0802   PLT 270 08/09/2015 1642   MCV 90.6 01/16/2023 0802   MCV 87 08/09/2015 1642   MCH 31.2 01/16/2023 0802   MCHC 34.4 01/16/2023 0802   RDW 12.0 01/16/2023 0802   RDW 13.2 08/09/2015 1642   LYMPHSABS 1,538 01/16/2023 0802   LYMPHSABS 2.1 08/09/2015 1642   MONOABS 1.0 09/30/2020 1406   EOSABS 290 01/16/2023 0802   EOSABS 0.2 08/09/2015 1642   BASOSABS 73 01/16/2023 0802   BASOSABS 0.0 08/09/2015 1642    CMP     Component Value Date/Time   NA 142 01/16/2023 0802   NA 141 08/09/2015 1642   K 4.3 01/16/2023 0802   CL 106 01/16/2023 0802   CO2 25 01/16/2023 0802   GLUCOSE 105 (H) 01/16/2023 0802   BUN 21 01/16/2023 0802   BUN 13 08/09/2015 1642  CREATININE 1.29 01/16/2023 0802   CALCIUM 9.4 01/16/2023 0802   PROT 6.8 01/16/2023 0802   PROT 6.9 08/09/2015 1642   ALBUMIN 3.6 10/01/2020 0612   ALBUMIN 4.3 08/09/2015 1642   AST 30 01/16/2023 0802   ALT 45 01/16/2023 0802   ALKPHOS 61 10/01/2020 0612   BILITOT 0.3 01/16/2023 0802   BILITOT 0.3 08/09/2015 1642   GFRNONAA 84 03/11/2021 0936   GFRAA 98 03/11/2021 0936    Assessment: 1.  Diarrhea: Mostly solid stool  followed by sometimes looser stool in the morning, this has been happening for years and has not changed recently, does not really bother the patient, no abdominal pain, no bleeding, no weight loss; likely diet related 2.  History of adenomatous polyps: 4 adenomatous polyps in 2020, letter sent for 7-year recall, question if this should be 3-5 instead  Plan: 1.  Will discuss recall with Dr. Lavon Paganini to see if we need to change it to 5 years.  Explained to patient that we will call and let him know if this needs to be different. 2.  Discussed variance in stools, likely diet related.  Would recommend he trial a fiber supplement first such as Benefiber, Citrucel or Metamucil, one dose daily for the next couple of weeks and increase to twice daily if he can. 3.  Patient to follow in clinic with [place] per recommendations or as needed.  Aaron Meeker, PA-C [facility] 06/16/2023, 1:31 PM  Cc: [name], NP

## 2023-06-22 ENCOUNTER — Other Ambulatory Visit (HOSPITAL_COMMUNITY): Payer: Self-pay

## 2023-06-26 ENCOUNTER — Other Ambulatory Visit (HOSPITAL_COMMUNITY): Payer: Self-pay

## 2023-06-26 ENCOUNTER — Telehealth: Payer: Self-pay

## 2023-06-26 ENCOUNTER — Other Ambulatory Visit: Payer: Self-pay

## 2023-06-26 DIAGNOSIS — Z86010 Personal history of colonic polyps: Secondary | ICD-10-CM

## 2023-06-26 MED ORDER — NA SULFATE-K SULFATE-MG SULF 17.5-3.13-1.6 GM/177ML PO SOLN
1.0000 | Freq: Once | ORAL | 0 refills | Status: AC
  Filled 2023-06-26: qty 354, 30d supply, fill #0

## 2023-06-26 NOTE — Telephone Encounter (Signed)
-----   Message from Unk Lightning sent at 06/26/2023  8:55 AM EDT ----- Regarding: FW: recall colon Please call patient-recall colon due if they would like to schedule.  Thanks-JLL ----- Message ----- From: Napoleon Form, MD Sent: 06/25/2023   3:37 PM EDT To: Unk Lightning, PA Subject: RE: recall colon                               Agree recall should be 3 to 5 years, we can set patient up for repeat colonoscopy if they are willing to proceed.  Thank you ----- Message ----- From: Unk Lightning, PA Sent: 06/16/2023   1:51 PM EDT To: Napoleon Form, MD Subject: recall colon                                   Patient was sent a letter for 7-year recall.  His last colonoscopy was in November 2020 but there was a finding of 4 adenomatous polyps all small in size.  Should this be more like 3 to 5 years?  Thanks, JL L

## 2023-06-26 NOTE — Telephone Encounter (Signed)
Called and spoke with patient regarding recommendations below. Patient has been scheduled for recall colonoscopy on 08/31/23 at 11:30 am with Dr. Lavon Paganini ([facility]). Patient is aware that he will need to arrive by 10:30 am with a care partner. Prep sent to pharmacy on file. Pt states that his wife may call back to review this information as well. Pt verbalized understanding and had no concerns at the end of the call.  SUPREP sent to pharmacy on file. Colonoscopy instructions sent to patient via MyChart and mailed. Ambulatory referral to GI in epic.

## 2023-07-07 ENCOUNTER — Other Ambulatory Visit: Payer: Self-pay | Admitting: Nurse Practitioner

## 2023-07-08 ENCOUNTER — Other Ambulatory Visit: Payer: Self-pay | Admitting: Adult Health

## 2023-07-08 DIAGNOSIS — N529 Male erectile dysfunction, unspecified: Secondary | ICD-10-CM

## 2023-07-08 MED ORDER — FLUTICASONE-SALMETEROL 100-50 MCG/ACT IN AEPB
1.0000 | INHALATION_SPRAY | Freq: Two times a day (BID) | RESPIRATORY_TRACT | 1 refills | Status: AC
Start: 2023-07-08 — End: ?
  Filled 2023-07-08: qty 60, 30d supply, fill #0
  Filled 2023-08-06: qty 60, 30d supply, fill #1

## 2023-07-08 NOTE — Telephone Encounter (Signed)
Patient has request refill on medication. Medication pend and sent to PCP [name], NP .

## 2023-07-09 ENCOUNTER — Other Ambulatory Visit: Payer: Self-pay

## 2023-07-09 ENCOUNTER — Other Ambulatory Visit (HOSPITAL_COMMUNITY): Payer: Self-pay

## 2023-07-09 MED ORDER — SILDENAFIL CITRATE 50 MG PO TABS
50.0000 mg | ORAL_TABLET | ORAL | 1 refills | Status: DC | PRN
Start: 2023-07-09 — End: 2023-09-21
  Filled 2023-07-09: qty 10, 30d supply, fill #0
  Filled 2023-08-13: qty 6, 30d supply, fill #1
  Filled 2023-09-21: qty 4, 20d supply, fill #2

## 2023-07-13 ENCOUNTER — Other Ambulatory Visit: Payer: Self-pay

## 2023-07-13 ENCOUNTER — Ambulatory Visit (INDEPENDENT_AMBULATORY_CARE_PROVIDER_SITE_OTHER): Payer: 59 | Admitting: Dermatology

## 2023-07-13 ENCOUNTER — Encounter: Payer: Self-pay | Admitting: Dermatology

## 2023-07-13 ENCOUNTER — Other Ambulatory Visit (HOSPITAL_COMMUNITY): Payer: Self-pay

## 2023-07-13 VITALS — BP 160/101 | HR 69

## 2023-07-13 DIAGNOSIS — L720 Epidermal cyst: Secondary | ICD-10-CM | POA: Diagnosis not present

## 2023-07-13 DIAGNOSIS — L570 Actinic keratosis: Secondary | ICD-10-CM

## 2023-07-13 DIAGNOSIS — L820 Inflamed seborrheic keratosis: Secondary | ICD-10-CM

## 2023-07-13 DIAGNOSIS — L821 Other seborrheic keratosis: Secondary | ICD-10-CM | POA: Diagnosis not present

## 2023-07-13 DIAGNOSIS — D485 Neoplasm of uncertain behavior of skin: Secondary | ICD-10-CM

## 2023-07-13 DIAGNOSIS — W908XXA Exposure to other nonionizing radiation, initial encounter: Secondary | ICD-10-CM | POA: Diagnosis not present

## 2023-07-13 DIAGNOSIS — L739 Follicular disorder, unspecified: Secondary | ICD-10-CM

## 2023-07-13 DIAGNOSIS — D229 Melanocytic nevi, unspecified: Secondary | ICD-10-CM

## 2023-07-13 DIAGNOSIS — L814 Other melanin hyperpigmentation: Secondary | ICD-10-CM

## 2023-07-13 DIAGNOSIS — L0100 Impetigo, unspecified: Secondary | ICD-10-CM

## 2023-07-13 DIAGNOSIS — D1801 Hemangioma of skin and subcutaneous tissue: Secondary | ICD-10-CM | POA: Diagnosis not present

## 2023-07-13 DIAGNOSIS — L578 Other skin changes due to chronic exposure to nonionizing radiation: Secondary | ICD-10-CM

## 2023-07-13 DIAGNOSIS — Z1283 Encounter for screening for malignant neoplasm of skin: Secondary | ICD-10-CM

## 2023-07-13 DIAGNOSIS — Z85828 Personal history of other malignant neoplasm of skin: Secondary | ICD-10-CM

## 2023-07-13 MED ORDER — MUPIROCIN 2 % EX OINT
1.0000 | TOPICAL_OINTMENT | Freq: Two times a day (BID) | CUTANEOUS | 1 refills | Status: DC
Start: 2023-07-13 — End: 2023-07-23
  Filled 2023-07-13: qty 44, 10d supply, fill #0
  Filled 2023-07-23: qty 44, 10d supply, fill #1

## 2023-07-13 MED ORDER — CLINDAMYCIN PHOSPHATE 1 % EX LOTN
TOPICAL_LOTION | Freq: Every day | CUTANEOUS | 3 refills | Status: AC
Start: 2023-07-13 — End: 2024-07-12
  Filled 2023-07-13: qty 60, 30d supply, fill #0
  Filled 2023-08-12: qty 60, 30d supply, fill #1
  Filled 2023-10-28: qty 60, 30d supply, fill #2
  Filled 2023-11-23: qty 60, 30d supply, fill #3

## 2023-07-13 NOTE — Patient Instructions (Addendum)
Hello Mr. Aaron [name],  Thank you for visiting [place] today at the clinic. Your dedication to maintaining your skin health and promptly addressing your concerns is greatly appreciated.  Here is a summary of the key instructions and next steps from today's consultation:  - Total Body Skin Examination: A comprehensive examination was conducted, covering areas from your scalp to your feet, to identify any abnormal spots or growths.  - Sunscreen Usage: It is important to continue applying sunscreen regularly to protect your skin from sun damage.  - Treatment for Identified Skin Issues:   - Milia and Seborrheic Dermatitis: These conditions are benign and do not require specific treatment.   - Ear Irritation: You have been prescribed a piercing ointment to alleviate the irritation and prevent infection.   - Actinic Keratosis: Cryotherapy (freezing) was performed on the rough patches to prevent potential development into skin cancer.   - Cysts and Comedones: Warm compresses are advised for cysts. Comedones do not require treatment unless they become problematic.   - Folliculitis: Topical Clindamycin has been prescribed to reduce bacterial count and inflammation.  - Biopsy: A biopsy was performed on a suspicious spot that may be basal cell carcinoma. We will inform you of the results once they are available.  At home treatments:   - Cerave Acne Wash: Recommended to manage oily skin on face and prevent acne formation.   - Vaseline: To be applied on the spots treated with cryotherapy to aid in healing.  - Follow-Up Appointment: We have scheduled a follow-up appointment in six months to re-evaluate the treated areas and discuss the biopsy results.  Please ensure to follow the care instructions provided. Do not hesitate to contact us if you have any questions or concerns in the meantime.  Warm regards,  Dr. Langston Reusing, Dermatologist   Patient Handout: Wound Care for Skin Biopsy Site  Taking Care of  Your Skin Biopsy Site  Proper care of the biopsy site is essential for promoting healing and minimizing scarring. This handout provides instructions on how to care for your biopsy site to ensure optimal recovery.  1. Cleaning the Wound:  Clean the biopsy site daily with gentle soap and water. Gently pat the area dry with a clean, soft towel. Avoid harsh scrubbing or rubbing the area, as this can irritate the skin and delay healing.  2. Applying Aquaphor and Bandage:  After cleaning the wound, apply a thin layer of Aquaphor ointment to the biopsy site. Cover the area with a sterile bandage to protect it from dirt, bacteria, and friction. Change the bandage daily or as needed if it becomes soiled or wet.  3. Continued Care for One Week:  Repeat the cleaning, Aquaphor application, and bandaging process daily for one week following the biopsy procedure. Keeping the wound clean and moist during this initial healing period will help prevent infection and promote optimal healing.  4. Massaging Aquaphor into the Area:  ---After one week, discontinue the use of bandages but continue to apply Aquaphor to the biopsy site. ----Gently massage the Aquaphor into the area using circular motions. ---Massaging the skin helps to promote circulation and prevent the formation of scar tissue.   Additional Tips:  Avoid exposing the biopsy site to direct sunlight during the healing process, as this can cause hyperpigmentation or worsen scarring. If you experience any signs of infection, such as increased redness, swelling, warmth, or drainage from the wound, contact your healthcare provider immediately. Follow any additional instructions provided by your healthcare provider  for caring for the biopsy site and managing any discomfort. Conclusion:  Taking proper care of your skin biopsy site is crucial for ensuring optimal healing and minimizing scarring. By following these instructions for cleaning,  applying Aquaphor, and massaging the area, you can promote a smooth and successful recovery. If you have any questions or concerns about caring for your biopsy site, don't hesitate to contact your healthcare provider for guidance.    Cryotherapy Aftercare  Wash gently with soap and water everyday.   Apply Vaseline and Band-Aid daily until healed.  Important Information  Due to recent changes in healthcare laws, you may see results of your pathology and/or laboratory studies on MyChart before the doctors have had a chance to review them. We understand that in some cases there may be results that are confusing or concerning to you. Please understand that not all results are received at the same time and often the doctors may need to interpret multiple results in order to provide you with the best plan of care or course of treatment. Therefore, we ask that you please give [place] 2 business days to thoroughly review all your results before contacting the office for clarification. Should we see a critical lab result, you will be contacted sooner.   If You Need Anything After Your Visit  If you have any questions or concerns for your doctor, please call our main line at [phone] If no one answers, please leave a voicemail as directed and we will return your call as soon as possible. Messages left after 4 pm will be answered the following business day.   You may also send [place] a message via MyChart. We typically respond to MyChart messages within 1-2 business days.  For prescription refills, please ask your pharmacy to contact our office. Our fax number is [phone].  If you have an urgent issue when the clinic is closed that cannot wait until the next business day, you can page your doctor at the number below.    Please note that while we do our best to be available for urgent issues outside of office hours, we are not available 24/7.   If you have an urgent issue and are unable to reach [place], you  may choose to seek medical care at your doctor's office, retail clinic, urgent care center, or emergency room.  If you have a medical emergency, please immediately call 911 or go to the emergency department. In the event of inclement weather, please call our main line at [phone] for an update on the status of any delays or closures.  Dermatology Medication Tips: Please keep the boxes that topical medications come in in order to help keep track of the instructions about where and how to use these. Pharmacies typically print the medication instructions only on the boxes and not directly on the medication tubes.   If your medication is too expensive, please contact our office at [phone] or send [place] a message through MyChart.   We are unable to tell what your co-pay for medications will be in advance as this is different depending on your insurance coverage. However, we may be able to find a substitute medication at lower cost or fill out paperwork to get insurance to cover a needed medication.   If a prior authorization is required to get your medication covered by your insurance company, please allow [place] 1-2 business days to complete this process.  Drug prices often vary depending on where the prescription is filled and  some pharmacies may offer cheaper prices.  The website www.goodrx.com contains coupons for medications through different pharmacies. The prices here do not account for what the cost may be with help from insurance (it may be cheaper with your insurance), but the website can give you the price if you did not use any insurance.  - You can print the associated coupon and take it with your prescription to the pharmacy.  - You may also stop by our office during regular business hours and pick up a GoodRx coupon card.  - If you need your prescription sent electronically to a different pharmacy, notify our office through [facility] or by phone at [phone]

## 2023-07-13 NOTE — Progress Notes (Signed)
New Patient Visit   Subjective  Aaron [name] is a 67 y.o. male accompanied by his wife Aaron [name]) who presents for the following: TBSE  Patient present today for new patient visit for TBSE. Patient reports his last TBSE was about 3 years ago with [facility]. Patient reports throughout his lifetime has had moderate to severe sun exposure. Currently, patient reports if he has excessive sun exposure, He does apply sunscreen and/or wears protective coverings. Patient report Hx of bx (BCC). Patient denies family history of skin cancer(s).    The following portions of the chart were reviewed this encounter and updated as appropriate: medications, allergies, medical history  Review of Systems:  No other skin or systemic complaints except as noted in HPI or Assessment and Plan.  Objective  Well appearing patient in no apparent distress; mood and affect are within normal limits.  A full examination was performed including scalp, head, eyes, ears, nose, lips, neck, chest, axillae, abdomen, back, buttocks, bilateral upper extremities, bilateral lower extremities, hands, feet, fingers, toes, fingernails, and toenails. All findings within normal limits unless otherwise noted below.   Relevant exam findings are noted in the Assessment and Plan.  Chest - Medial (Center) A: 8 mm pink Papule   Chest - Medial (Center) B: 6 mm Pink Papule       Assessment & Plan   AK (actinic keratosis) (3) Left Zygomatic Area; Left Buccal Cheek; Right Forehead  Destruction of lesion - Left Buccal Cheek, Left Zygomatic Area, Right Forehead Complexity: simple   Destruction method: cryotherapy   Informed consent: discussed and consent obtained   Timeout:  patient name, date of birth, surgical site, and procedure verified Lesion destroyed using liquid nitrogen: Yes   Outcome: patient tolerated procedure well with no complications   Post-procedure details: wound care instructions given     Impetigo  Folliculitis  Neoplasm of uncertain behavior of skin (2) Chest - Medial (Center)  Skin / nail biopsy Type of biopsy: tangential   Informed consent: discussed and consent obtained   Timeout: patient name, date of birth, surgical site, and procedure verified   Procedure prep:  Patient was prepped and draped in usual sterile fashion Prep type:  Isopropyl alcohol Anesthesia: the lesion was anesthetized in a standard fashion   Anesthetic:  1% lidocaine w/ epinephrine 1-100,000 buffered w/ 8.4% NaHCO3 Instrument used: DermaBlade   Hemostasis achieved with: aluminum chloride   Outcome: patient tolerated procedure well   Post-procedure details: sterile dressing applied and wound care instructions given   Dressing type: petrolatum gauze and bandage    Chest - Medial (Center)  Skin / nail biopsy Type of biopsy: tangential   Informed consent: discussed and consent obtained   Timeout: patient name, date of birth, surgical site, and procedure verified   Procedure prep:  Patient was prepped and draped in usual sterile fashion Prep type:  Isopropyl alcohol Anesthesia: the lesion was anesthetized in a standard fashion   Anesthetic:  1% lidocaine w/ epinephrine 1-100,000 buffered w/ 8.4% NaHCO3 Instrument used: DermaBlade   Hemostasis achieved with: aluminum chloride   Outcome: patient tolerated procedure well   Post-procedure details: sterile dressing applied and wound care instructions given   Dressing type: petrolatum gauze and bandage    LENTIGINES, SEBORRHEIC KERATOSES, HEMANGIOMAS - Benign normal skin lesions - Benign-appearing - Call for any changes  MELANOCYTIC NEVI - Tan-brown and/or pink-flesh-colored symmetric macules and papules - Benign appearing on exam today - Observation - Call clinic for new or changing moles -  Recommend daily use of broad spectrum spf 30+ sunscreen to sun-exposed areas.   ACTINIC DAMAGE - Chronic condition, secondary to cumulative  UV/sun exposure - diffuse scaly erythematous macules with underlying dyspigmentation - Recommend daily broad spectrum sunscreen SPF 30+ to sun-exposed areas, reapply every 2 hours as needed.  - Staying in the shade or wearing long sleeves, sun glasses (UVA+UVB protection) and wide brim hats (4-inch brim around the entire circumference of the hat) are also recommended for sun protection.  - Call for new or changing lesions.  Milia on left side of scalp - tiny firm white papules - type of cyst - benign - sometimes these will clear with nightly OTC adapalene/Differin 0.1% gel or retinol. - may be extracted if symptomatic - observe  Impetigo Exam: Bacterial infection that affects the outer layers of the skin located   Treatment Plan: - We will prescribe Mupirocin Ointment to apply directly to affected area until clear  FOLLICULITIS Exam: Perifollicular erythematous papules and pustules Located at groin and Underwear line  Folliculitis occurs due to inflammation of the superficial hair follicle (pore), resulting in acne-like lesions (pus bumps). It can be infectious (bacterial, fungal) or noninfectious (shaving, tight clothing, heat/sweat, medications).  Folliculitis can be acute or chronic and recommended treatment depends on the underlying cause of folliculitis.  Treatment Plan: - We will plan to prescribe Clindamycin lotion to apply daily until areas are clear  SKIN CANCER SCREENING PERFORMED TODAY   Return in about 6 months (around 01/13/2024) for TBSE F/U.  Documentation: I have reviewed the above documentation for accuracy and completeness, and I agree with the above.  Stasia Cavalier, am acting as scribe for Langston Reusing, DO.  Langston Reusing, DO

## 2023-07-16 NOTE — Progress Notes (Signed)
Hi Aaron [name]  Dr. Onalee Hua reviewed your biopsy results and they showed the spots removed were benign (not cancerous).  No additional treatment is required.  The detailed report is available to view in MyChart.  Have a great day!  Kind Regards,  Dr. Kermit Balo Care Team

## 2023-07-17 ENCOUNTER — Other Ambulatory Visit (HOSPITAL_COMMUNITY): Payer: Self-pay

## 2023-07-18 ENCOUNTER — Other Ambulatory Visit (HOSPITAL_COMMUNITY): Payer: Self-pay

## 2023-07-20 ENCOUNTER — Ambulatory Visit: Payer: 59 | Admitting: Nurse Practitioner

## 2023-07-20 ENCOUNTER — Other Ambulatory Visit (HOSPITAL_COMMUNITY): Payer: Self-pay

## 2023-07-20 ENCOUNTER — Other Ambulatory Visit: Payer: Self-pay

## 2023-07-20 MED ORDER — DICLOFENAC POTASSIUM 50 MG PO TABS
50.0000 mg | ORAL_TABLET | Freq: Two times a day (BID) | ORAL | 2 refills | Status: DC
  Filled 2023-07-20: qty 60, 30d supply, fill #0
  Filled 2023-08-21: qty 60, 30d supply, fill #1
  Filled 2023-09-21: qty 60, 30d supply, fill #2

## 2023-07-23 ENCOUNTER — Other Ambulatory Visit (HOSPITAL_COMMUNITY): Payer: Self-pay

## 2023-07-23 ENCOUNTER — Other Ambulatory Visit: Payer: Self-pay | Admitting: Dermatology

## 2023-07-23 ENCOUNTER — Other Ambulatory Visit: Payer: Self-pay

## 2023-07-23 DIAGNOSIS — L0100 Impetigo, unspecified: Secondary | ICD-10-CM

## 2023-07-23 MED ORDER — MUPIROCIN 2 % EX OINT
1.0000 | TOPICAL_OINTMENT | Freq: Two times a day (BID) | CUTANEOUS | 5 refills | Status: DC
  Filled 2023-07-23: qty 44, 22d supply, fill #0
  Filled 2023-08-12: qty 44, 22d supply, fill #1
  Filled 2023-10-28: qty 44, 22d supply, fill #2
  Filled 2023-11-23: qty 44, 22d supply, fill #3

## 2023-07-24 ENCOUNTER — Other Ambulatory Visit (HOSPITAL_COMMUNITY): Payer: Self-pay

## 2023-08-02 ENCOUNTER — Encounter: Payer: Self-pay | Admitting: Dermatology

## 2023-08-06 ENCOUNTER — Other Ambulatory Visit (HOSPITAL_COMMUNITY): Payer: Self-pay

## 2023-08-12 ENCOUNTER — Other Ambulatory Visit (HOSPITAL_COMMUNITY): Payer: Self-pay

## 2023-08-12 ENCOUNTER — Other Ambulatory Visit: Payer: Self-pay

## 2023-08-13 ENCOUNTER — Other Ambulatory Visit (HOSPITAL_COMMUNITY): Payer: Self-pay

## 2023-08-13 ENCOUNTER — Other Ambulatory Visit: Payer: Self-pay

## 2023-08-17 ENCOUNTER — Encounter: Payer: Self-pay | Admitting: Nurse Practitioner

## 2023-08-17 ENCOUNTER — Ambulatory Visit (INDEPENDENT_AMBULATORY_CARE_PROVIDER_SITE_OTHER): Payer: 59 | Admitting: Nurse Practitioner

## 2023-08-17 VITALS — BP 124/80 | HR 65 | Temp 97.3°F | Ht 73.0 in | Wt 237.0 lb

## 2023-08-17 DIAGNOSIS — H6121 Impacted cerumen, right ear: Secondary | ICD-10-CM

## 2023-08-17 DIAGNOSIS — Z0000 Encounter for general adult medical examination without abnormal findings: Secondary | ICD-10-CM

## 2023-08-17 DIAGNOSIS — Z1159 Encounter for screening for other viral diseases: Secondary | ICD-10-CM

## 2023-08-17 DIAGNOSIS — F419 Anxiety disorder, unspecified: Secondary | ICD-10-CM | POA: Diagnosis not present

## 2023-08-17 DIAGNOSIS — K219 Gastro-esophageal reflux disease without esophagitis: Secondary | ICD-10-CM | POA: Diagnosis not present

## 2023-08-17 DIAGNOSIS — I10 Essential (primary) hypertension: Secondary | ICD-10-CM | POA: Diagnosis not present

## 2023-08-17 DIAGNOSIS — M109 Gout, unspecified: Secondary | ICD-10-CM

## 2023-08-17 DIAGNOSIS — F330 Major depressive disorder, recurrent, mild: Secondary | ICD-10-CM | POA: Diagnosis not present

## 2023-08-17 DIAGNOSIS — Z23 Encounter for immunization: Secondary | ICD-10-CM

## 2023-08-17 DIAGNOSIS — E782 Mixed hyperlipidemia: Secondary | ICD-10-CM | POA: Diagnosis not present

## 2023-08-17 NOTE — Progress Notes (Signed)
Careteam: Patient Care Team: [name], NP as PCP - General (Nurse Practitioner) [name], [name], NP (Inactive) as Nurse Practitioner (Vascular Surgery) [name], MD as Consulting Physician (General Surgery) [name], MD as Consulting Physician (Pulmonary Disease) [name], MD as Consulting Physician (Cardiology) [name], PA-C as Physician Assistant (Dermatology) Napoleon Form, MD as Consulting Physician (Gastroenterology)  PLACE OF SERVICE:  [facility]  Advanced Directive information Does Patient Have a Medical Advance Directive?: No, Would patient like information on creating a medical advance directive?: Yes (MAU/Ambulatory/Procedural Areas - Information given)  Allergies  Allergen Reactions   Singulair [Montelukast]     Chief Complaint  Patient presents with   Medical Management of Chronic Issues    6 month follow-up. Discuss need for flu vaccine. NCIR verified. Patient c/o indigestion, ? Change medication. Request to examine ear for fullness Discuss gout flare up's.      HPI: Patient is a 67 y.o. [name] presents for 6 month follow-up.  No current concerns.  Started exercising some, walking and using electric bike. Has not made changes to diet.  Had a cough that lingered for a long time but it has now resolved.   Has had recent flare-up of gout, taking medication as prescribed. Is cutting back on red meat. Unsure what caused flare up this time. Drinking daily, up to 2 drinks per day.   Having more indigestion with certain foods.   Had recent skin biopsy which was negative.  Has [place] of AAA in the next month.  Feels like he could come off Wellbutrin   Review of Systems:  Review of Systems  Constitutional:  Negative for chills, fever and malaise/fatigue.  Respiratory:  Negative for cough and shortness of breath.   Cardiovascular:  Negative for chest pain, palpitations and leg swelling.  Gastrointestinal:  Negative for  constipation, diarrhea, nausea and vomiting.  Genitourinary:  Negative for dysuria, frequency and urgency.  Neurological:  Negative for dizziness, weakness and headaches.  Psychiatric/Behavioral:  Negative for depression. The patient is not nervous/anxious.     Past Medical History:  Diagnosis Date   Abdominal aortic aneurysm (AAA) without rupture (HCC) 11/01/2013   Abdominal aortic aneurysm [facility])    sees Dr. Jacinto Halim for cardiac f/u, [phone]   Arthritis    lower back   Asthma    followed by Dr. Samara Snide   Chronic airway obstruction, not elsewhere classified    Dyspnea    normal PFT April 2012   External hemorrhoids without mention of complication    GERD (gastroesophageal reflux disease)    Gout    H/O hiatal hernia    Hemorrhoids    HTN (hypertension)    hx of sees Dr. Alonna Minium   Hyperlipidemia    Insomnia, unspecified    Other abnormal blood chemistry    Other dyspnea and respiratory abnormality    Other malaise and fatigue    Other specified erythematous condition(695.89)    Other testicular dysfunction    Primary hypertension 01/20/2011   Prostatitis, unspecified    Rash 2011   left chest, biopsy 2012 Dr. Charlton Haws, results pending   Skin cancer    Squamous cell carcinoma of skin 11/03/2014   right shoulder cx3 66fu   Tobacco abuse    Past Surgical History:  Procedure Laterality Date   ABDOMINAL AORTIC ENDOVASCULAR STENT GRAFT  09/22/2012   Procedure: ABDOMINAL AORTIC ENDOVASCULAR STENT GRAFT;  Surgeon: Pryor Ochoa, MD;  Location: [facility] OR;  Service: Vascular;  Laterality: N/A;  GORE; ultrasound guided.   APPENDECTOMY     EVALUATION UNDER ANESTHESIA WITH HEMORRHOIDECTOMY N/A 12/31/2016   Procedure: EXAM UNDER ANESTHESIA WITH HEMORRHOIDECTOMY;  Surgeon: [name], MD;  Location: [facility];  Service: General;  Laterality: N/A;   SKIN CANCER EXCISION     TOE SURGERY  01/2012   joint of left great toe   TONSILLECTOMY     TONSILLECTOMY      TYMPANOPLASTY     WRIST SURGERY     left   Social History:   reports that he quit smoking about 2 years ago. His smoking use included cigarettes. He started smoking about 40 years ago. He has a 9.5 pack-year smoking history. He has never been exposed to tobacco smoke. He quit smokeless tobacco use about 11 years ago.  His smokeless tobacco use included snuff. He reports current alcohol use. He reports that he does not use drugs.  Family History  Problem Relation Age of Onset   Cancer Mother        Breast   Heart attack Father 52   COPD Father    Heart disease Father    Osler-Weber-Rendu syndrome Brother    Asthma Daughter    Lupus Daughter    Diabetes Other        Grandson    Colon cancer Neg Hx    Pancreatic cancer Neg Hx    Esophageal cancer Neg Hx    Stomach cancer Neg Hx    Rectal cancer Neg Hx     Medications: Patient's Medications  New Prescriptions   No medications on file  Previous Medications   ACETAMINOPHEN (TYLENOL) 500 MG TABLET    Take 2 tablets (1,000 mg total) by mouth every 6 (six) hours as needed.   ALBUTEROL (VENTOLIN HFA) 108 (90 BASE) MCG/ACT INHALER    Inhale 2 puffs into the lungs every 6 (six) hours as needed for cough and wheezing.   ALLOPURINOL (ZYLOPRIM) 100 MG TABLET    Take 1 tablet (100 mg total) by mouth daily.   ALPRAZOLAM (XANAX) 0.25 MG TABLET    Take 1 tablet (0.25 mg) by mouth daily as needed for anxiety (to last 30 days).   AMLODIPINE (NORVASC) 10 MG TABLET    Take 1 tablet (10 mg total) by mouth daily.   ASPIRIN EC 81 MG TABLET    Take 1 tablet (81 mg total) by mouth daily.   BUPROPION (WELLBUTRIN XL) 150 MG 24 HR TABLET    Take 1 tablet (150 mg total) by mouth every other day.   CLINDAMYCIN (CLEOCIN-T) 1 % LOTION    Apply topically daily.   COLCHICINE 0.6 MG TABLET    Take 0.6 mg by mouth as needed. For gout flare up   DICLOFENAC (CATAFLAM) 50 MG TABLET    Take 1 tablet (50 mg total) by mouth 2 (two) times daily.    FLUTICASONE-SALMETEROL (ADVAIR) 100-50 MCG/ACT AEPB    Inhale 1 puff into the lungs 2 (two) times daily.   LOSARTAN (COZAAR) 100 MG TABLET    Take 1 tablet (100 mg total) by mouth daily.   MUPIROCIN OINTMENT (BACTROBAN) 2 %    Apply 1 Application topically 2 (two) times daily.   PANTOPRAZOLE (PROTONIX) 40 MG TABLET    TAKE 1 TABLET BY MOUTH ONCE A DAY   SILDENAFIL (VIAGRA) 50 MG TABLET    Take 1 tablet (50 mg) by mouth as needed for erectile dysfunction.   SIMVASTATIN (ZOCOR) 20 MG  TABLET    Take 1 tablet (20 mg total) by mouth at bedtime.  Modified Medications   No medications on file  Discontinued Medications   MONTELUKAST (SINGULAIR) 10 MG TABLET    Take 1 tablet (10 mg total) by mouth at bedtime.    Physical Exam:  Vitals:   08/17/23 1101  BP: 124/80  Pulse: 65  Temp: (!) 97.3 F (36.3 C)  TempSrc: Temporal  SpO2: 96%  Weight: 107.5 kg  Height: 6\' 1"  (1.854 m)   Body mass index is 31.27 kg/m. Wt Readings from Last 3 Encounters:  08/17/23 107.5 kg  06/16/23 106.2 kg  05/14/23 107.3 kg    Physical Exam Constitutional:      Appearance: Normal appearance.  Cardiovascular:     Rate and Rhythm: Normal rate and regular rhythm.     Pulses: Normal pulses.     Heart sounds: Normal heart sounds.  Pulmonary:     Effort: Pulmonary effort is normal.     Breath sounds: Normal breath sounds.  Abdominal:     General: Bowel sounds are normal.     Palpations: Abdomen is soft.  Musculoskeletal:        General: Normal range of motion.  Skin:    General: Skin is warm and dry.  Neurological:     General: No focal deficit present.     Mental Status: He is alert and oriented to person, place, and time.  Psychiatric:        Mood and Affect: Mood normal.        Behavior: Behavior normal.     Labs reviewed: Basic Metabolic Panel: Recent Labs    01/16/23 0802  NA 142  K 4.3  CL 106  CO2 25  GLUCOSE 105*  BUN 21  CREATININE 1.29  CALCIUM 9.4   Liver Function  Tests: Recent Labs    01/16/23 0802  AST 30  ALT 45  BILITOT 0.3  PROT 6.8   No results for input(s): "LIPASE", "AMYLASE" in the last 8760 hours. No results for input(s): "AMMONIA" in the last 8760 hours. CBC: Recent Labs    01/16/23 0802  WBC 6.6  NEUTROABS 4,000  HGB 15.0  HCT 43.6  MCV 90.6  PLT 262   Lipid Panel: Recent Labs    01/16/23 0802  CHOL 141  HDL 44  LDLCALC 73  TRIG 162*  CHOLHDL 3.2   TSH: No results for input(s): "TSH" in the last 8760 hours. A1C: Lab Results  Component Value Date   HGBA1C 5.8 (H) 01/16/2023     Assessment/Plan 1. Essential hypertension -Continue current medication  -Dietary modifications encouraged and physical activity as tolerated  2. Gout, unspecified cause, unspecified chronicity, unspecified site -Recent flare up, discussed dietary modifications  -Continue current medication regimen -Dietary modifications encouraged  3. Gastroesophageal reflux disease, unspecified whether esophagitis present -Not controlled when he eats certain food.  -Dietary modifications encouraged and information provided.  -Continue current medication regimen; wanted to try different medication but educated on dietary modifications first -- avoid trigger foods  4. Mixed hyperlipidemia -Lipid panel -Continue simvastatin -Dietary modifications encouraged and physical activity as tolerated  5. Anxiety -Stable -Lifestyle modifications encouraged -PRN xanax  6. Mild episode of recurrent major depressive disorder (HCC) -Stable -Continue bupropion; wants to wean himself off of it, already taking every other day, can take every 3 days for 2 weeks then stop.   7. Routine general medical examination at a health care facility- placed at last encounter but  did not get blood work until today - Complete Metabolic Panel with eGFR - CBC with Differential/Platelet - Hemoglobin A1c - PSA  8. Need for hepatitis C screening test - Hepatitis C  antibody  9. Right ear impacted cerumen -Ear lavage, pt tolerated well and wax removed.   10. Need for influenza vaccination - Flu vaccine trivalent PF, 6mos and older(Flulaval,Afluria,Fluarix,Fluzone)   Return in about 6 months (around 02/14/2024) for routine follow up .  Rollen Sox, FNP-MSN Student -I personally was present during the history, physical exam and medical decision-making activities of this service and have verified that the service and findings are accurately documented in the student's note Abbey Chatters, NP

## 2023-08-18 LAB — CBC WITH DIFFERENTIAL/PLATELET
Absolute Monocytes: 673 cells/uL (ref 200–950)
Basophils Absolute: 51 cells/uL (ref 0–200)
Basophils Relative: 0.9 %
Eosinophils Absolute: 182 cells/uL (ref 15–500)
Eosinophils Relative: 3.2 %
HCT: 43.4 % (ref 38.5–50.0)
Hemoglobin: 14.6 g/dL (ref 13.2–17.1)
Lymphs Abs: 1550 cells/uL (ref 850–3900)
MCH: 31.3 pg (ref 27.0–33.0)
MCHC: 33.6 g/dL (ref 32.0–36.0)
MCV: 93.1 fL (ref 80.0–100.0)
MPV: 9.5 fL (ref 7.5–12.5)
Monocytes Relative: 11.8 %
Neutro Abs: 3243 cells/uL (ref 1500–7800)
Neutrophils Relative %: 56.9 %
Platelets: 292 10*3/uL (ref 140–400)
RBC: 4.66 10*6/uL (ref 4.20–5.80)
RDW: 12.2 % (ref 11.0–15.0)
Total Lymphocyte: 27.2 %
WBC: 5.7 10*3/uL (ref 3.8–10.8)

## 2023-08-18 LAB — HEPATITIS C ANTIBODY: Hepatitis C Ab: NONREACTIVE

## 2023-08-18 LAB — COMPLETE METABOLIC PANEL WITH GFR
AG Ratio: 1.8 (calc) (ref 1.0–2.5)
ALT: 30 U/L (ref 9–46)
AST: 29 U/L (ref 10–35)
Albumin: 4.4 g/dL (ref 3.6–5.1)
Alkaline phosphatase (APISO): 88 U/L (ref 35–144)
BUN: 12 mg/dL (ref 7–25)
CO2: 28 mmol/L (ref 20–32)
Calcium: 9.6 mg/dL (ref 8.6–10.3)
Chloride: 104 mmol/L (ref 98–110)
Creat: 1.16 mg/dL (ref 0.70–1.35)
Globulin: 2.4 g/dL (calc) (ref 1.9–3.7)
Glucose, Bld: 92 mg/dL (ref 65–99)
Potassium: 4.4 mmol/L (ref 3.5–5.3)
Sodium: 142 mmol/L (ref 135–146)
Total Bilirubin: 0.7 mg/dL (ref 0.2–1.2)
Total Protein: 6.8 g/dL (ref 6.1–8.1)
eGFR: 70 mL/min/{1.73_m2} (ref >=60)

## 2023-08-18 LAB — HEMOGLOBIN A1C
Hgb A1c MFr Bld: 5.6 % of total Hgb (ref <5.7)
Mean Plasma Glucose: 114 mg/dL
eAG (mmol/L): 6.3 mmol/L

## 2023-08-18 LAB — PSA: PSA: 0.42 ng/mL (ref <=4.00)

## 2023-08-21 ENCOUNTER — Other Ambulatory Visit (HOSPITAL_COMMUNITY): Payer: Self-pay

## 2023-08-27 ENCOUNTER — Other Ambulatory Visit: Payer: Self-pay

## 2023-08-27 ENCOUNTER — Other Ambulatory Visit: Payer: Self-pay | Admitting: Nurse Practitioner

## 2023-08-27 ENCOUNTER — Other Ambulatory Visit (HOSPITAL_COMMUNITY): Payer: Self-pay

## 2023-08-27 MED ORDER — FLUTICASONE-SALMETEROL 100-50 MCG/ACT IN AEPB
1.0000 | INHALATION_SPRAY | Freq: Two times a day (BID) | RESPIRATORY_TRACT | 1 refills | Status: DC
  Filled 2023-08-27 – 2023-09-09 (×2): qty 60, 30d supply, fill #0
  Filled 2023-10-09: qty 60, 30d supply, fill #1

## 2023-08-31 ENCOUNTER — Encounter: Payer: 59 | Admitting: Gastroenterology

## 2023-08-31 ENCOUNTER — Other Ambulatory Visit (HOSPITAL_COMMUNITY): Payer: Self-pay

## 2023-08-31 ENCOUNTER — Other Ambulatory Visit: Payer: Self-pay | Admitting: Nurse Practitioner

## 2023-08-31 DIAGNOSIS — Z87891 Personal history of nicotine dependence: Secondary | ICD-10-CM

## 2023-09-05 ENCOUNTER — Other Ambulatory Visit: Payer: Self-pay | Admitting: Nurse Practitioner

## 2023-09-05 ENCOUNTER — Other Ambulatory Visit (HOSPITAL_COMMUNITY): Payer: Self-pay

## 2023-09-05 DIAGNOSIS — Z87891 Personal history of nicotine dependence: Secondary | ICD-10-CM

## 2023-09-07 ENCOUNTER — Other Ambulatory Visit: Payer: Self-pay | Admitting: *Deleted

## 2023-09-07 ENCOUNTER — Other Ambulatory Visit: Payer: Self-pay

## 2023-09-07 ENCOUNTER — Other Ambulatory Visit (HOSPITAL_COMMUNITY): Payer: Self-pay

## 2023-09-07 DIAGNOSIS — F330 Major depressive disorder, recurrent, mild: Secondary | ICD-10-CM

## 2023-09-07 MED ORDER — BUPROPION HCL ER (XL) 150 MG PO TB24
150.0000 mg | ORAL_TABLET | Freq: Every day | ORAL | 1 refills | Status: DC
  Filled 2023-09-07: qty 90, 90d supply, fill #0
  Filled 2023-11-23 – 2023-11-27 (×3): qty 90, 90d supply, fill #1

## 2023-09-07 NOTE — Telephone Encounter (Signed)
Patient called requesting refill

## 2023-09-09 ENCOUNTER — Other Ambulatory Visit (HOSPITAL_COMMUNITY): Payer: Self-pay

## 2023-09-09 ENCOUNTER — Other Ambulatory Visit: Payer: Self-pay

## 2023-09-21 ENCOUNTER — Other Ambulatory Visit: Payer: Self-pay | Admitting: *Deleted

## 2023-09-21 ENCOUNTER — Other Ambulatory Visit (HOSPITAL_COMMUNITY): Payer: Self-pay

## 2023-09-21 ENCOUNTER — Other Ambulatory Visit: Payer: Self-pay

## 2023-09-21 DIAGNOSIS — Z95828 Presence of other vascular implants and grafts: Secondary | ICD-10-CM

## 2023-09-21 DIAGNOSIS — N529 Male erectile dysfunction, unspecified: Secondary | ICD-10-CM

## 2023-09-21 MED ORDER — ASPIRIN 81 MG PO TBEC
81.0000 mg | DELAYED_RELEASE_TABLET | Freq: Every day | ORAL | 1 refills | Status: DC
  Filled 2023-09-21: qty 90, 90d supply, fill #0
  Filled 2023-12-17: qty 90, 90d supply, fill #1

## 2023-09-21 MED ORDER — SILDENAFIL CITRATE 50 MG PO TABS
50.0000 mg | ORAL_TABLET | ORAL | 5 refills | Status: DC | PRN
  Filled 2023-09-21: qty 6, 30d supply, fill #0
  Filled 2023-10-28: qty 6, 30d supply, fill #1
  Filled 2023-11-24 – 2023-11-27 (×2): qty 6, 30d supply, fill #2
  Filled 2024-01-02: qty 6, 30d supply, fill #3
  Filled 2024-02-03 (×2): qty 6, 30d supply, fill #4
  Filled 2024-03-07: qty 6, 30d supply, fill #5

## 2023-09-21 NOTE — Telephone Encounter (Signed)
Received refill request from [facility]

## 2023-09-22 ENCOUNTER — Other Ambulatory Visit: Payer: Self-pay

## 2023-09-25 HISTORY — PX: COLONOSCOPY: SHX174

## 2023-09-29 ENCOUNTER — Other Ambulatory Visit (HOSPITAL_COMMUNITY): Payer: 59

## 2023-09-29 ENCOUNTER — Ambulatory Visit: Payer: Medicare Other

## 2023-10-04 ENCOUNTER — Encounter: Payer: Self-pay | Admitting: Certified Registered Nurse Anesthetist

## 2023-10-09 ENCOUNTER — Other Ambulatory Visit (HOSPITAL_COMMUNITY): Payer: Self-pay

## 2023-10-12 ENCOUNTER — Encounter: Payer: Self-pay | Admitting: Gastroenterology

## 2023-10-12 ENCOUNTER — Ambulatory Visit: Payer: 59 | Admitting: Gastroenterology

## 2023-10-12 VITALS — BP 127/80 | HR 55 | Temp 98.0°F | Resp 19 | Ht 73.0 in | Wt 234.0 lb

## 2023-10-12 DIAGNOSIS — I10 Essential (primary) hypertension: Secondary | ICD-10-CM | POA: Diagnosis not present

## 2023-10-12 DIAGNOSIS — Z09 Encounter for follow-up examination after completed treatment for conditions other than malignant neoplasm: Secondary | ICD-10-CM | POA: Diagnosis not present

## 2023-10-12 DIAGNOSIS — Z1211 Encounter for screening for malignant neoplasm of colon: Secondary | ICD-10-CM | POA: Diagnosis not present

## 2023-10-12 DIAGNOSIS — D124 Benign neoplasm of descending colon: Secondary | ICD-10-CM

## 2023-10-12 DIAGNOSIS — Z860101 Personal history of adenomatous and serrated colon polyps: Secondary | ICD-10-CM | POA: Diagnosis not present

## 2023-10-12 DIAGNOSIS — M109 Gout, unspecified: Secondary | ICD-10-CM | POA: Diagnosis not present

## 2023-10-12 DIAGNOSIS — K635 Polyp of colon: Secondary | ICD-10-CM | POA: Diagnosis not present

## 2023-10-12 MED ORDER — SODIUM CHLORIDE 0.9 % IV SOLN: 500.0000 mL | Freq: Once | INTRAVENOUS | Status: AC

## 2023-10-12 NOTE — Progress Notes (Signed)
Report given to PACU, vss 

## 2023-10-12 NOTE — Op Note (Signed)
[facility] Patient Name: Aaron [name] Procedure Date: 10/12/2023 8:02 AM MRN: [id number] Endoscopist: Napoleon Form , MD, [id number] Age: 67 Referring MD:  Date of Birth: 1956-06-23 Gender: Male Account #: [id number] Procedure:                Colonoscopy Indications:              High risk colon cancer surveillance: Personal                            history of colonic polyps, High risk colon cancer                            surveillance: Personal history of multiple (3 or                            more) adenomas Medicines:                Monitored Anesthesia Care Procedure:                Pre-Anesthesia Assessment:                           - Prior to the procedure, a History and Physical                            was performed, and patient medications and                            allergies were reviewed. The patient's tolerance of                            previous anesthesia was also reviewed. The risks                            and benefits of the procedure and the sedation                            options and risks were discussed with the patient.                            All questions were answered, and informed consent                            was obtained. Prior Anticoagulants: The patient has                            taken no anticoagulant or antiplatelet agents. ASA                            Grade Assessment: III - A patient with severe                            systemic disease. After reviewing the risks and  benefits, the patient was deemed in satisfactory                            condition to undergo the procedure.                           After obtaining informed consent, the colonoscope                            was passed under direct vision. Throughout the                            procedure, the patient's blood pressure, pulse, and                            oxygen saturations were monitored  continuously. The                            Olympus Scope SN: [id number] was introduced through                            the anus and advanced to the the cecum, identified                            by appendiceal orifice and ileocecal valve. The                            colonoscopy was performed without difficulty. The                            patient tolerated the procedure well. The quality                            of the bowel preparation was good. The ileocecal                            valve, appendiceal orifice, and rectum were                            photographed. Scope In: 8:06:47 AM Scope Out: 8:30:50 AM Scope Withdrawal Time: 0 hours 13 minutes 41 seconds  Total Procedure Duration: 0 hours 24 minutes 3 seconds  Findings:                 The perianal and digital rectal examinations were                            normal.                           A 3 mm polyp was found in the descending colon. The                            polyp was sessile. The polyp was removed with a  cold snare. Resection and retrieval were complete.                           Multiple large-mouthed, medium-mouthed and                            small-mouthed diverticula were found in the sigmoid                            colon, descending colon, transverse colon and                            ascending colon. There was narrowing of the colon                            in association with the diverticular opening. There                            was evidence of diverticular spasm.                            Peri-diverticular erythema was seen. There was                            evidence of an impacted diverticulum.                           Non-bleeding external and internal hemorrhoids were                            found during retroflexion. The hemorrhoids were                            medium-sized. Complications:            No immediate  complications. Estimated Blood Loss:     Estimated blood loss was minimal. Impression:               - One 3 mm polyp in the descending colon, removed                            with a cold snare. Resected and retrieved.                           - Moderate diverticulosis in the sigmoid colon, in                            the descending colon, in the transverse colon and                            in the ascending colon. There was narrowing of the                            colon in association with the diverticular opening.  There was evidence of diverticular spasm.                            Peri-diverticular erythema was seen. There was                            evidence of an impacted diverticulum.                           - Non-bleeding external and internal hemorrhoids. Recommendation:           - Patient has a contact number available for                            emergencies. The signs and symptoms of potential                            delayed complications were discussed with the                            patient. Return to normal activities tomorrow.                            Written discharge instructions were provided to the                            patient.                           - Resume previous diet.                           - Continue present medications.                           - Await pathology results.                           - Repeat colonoscopy in 5 years for surveillance. Napoleon Form, MD 10/12/2023 8:36:00 AM This report has been signed electronically.

## 2023-10-12 NOTE — Patient Instructions (Addendum)
YOU HAD AN ENDOSCOPIC PROCEDURE TODAY AT THE [facility]:   Refer to the procedure report that was given to you for any specific questions about what was found during the examination.  If the procedure report does not answer your questions, please call your gastroenterologist to clarify.  If you requested that your care partner not be given the details of your procedure findings, then the procedure report has been included in a sealed envelope for you to review at your convenience later.  YOU SHOULD EXPECT: Some feelings of bloating in the abdomen. Passage of more gas than usual.  Walking can help get rid of the air that was put into your GI tract during the procedure and reduce the bloating. If you had a lower endoscopy (such as a colonoscopy or flexible sigmoidoscopy) you may notice spotting of blood in your stool or on the toilet paper. If you underwent a bowel prep for your procedure, you may not have a normal bowel movement for a few days.  Please Note:  You might notice some irritation and congestion in your nose or some drainage.  This is from the oxygen used during your procedure.  There is no need for concern and it should clear up in a day or so.  SYMPTOMS TO REPORT IMMEDIATELY:  Following lower endoscopy (colonoscopy or flexible sigmoidoscopy):  Excessive amounts of blood in the stool  Significant tenderness or worsening of abdominal pains  Swelling of the abdomen that is new, acute  Fever of 100F or higher  For urgent or emergent issues, a gastroenterologist can be reached at any hour by calling [phone]. Do not use MyChart messaging for urgent concerns.    DIET:  We do recommend a small meal at first, but then you may proceed to your regular diet.  Drink plenty of fluids but you should avoid alcoholic beverages for 24 hours.  MEDICATIONS: Continue present medications.  Please see handouts given to you by your recovery nurse: Polyps, Diverticulosis,  Hemorrhoids.  FOLLOW UP: Await pathology results. Repeat colonoscopy in 5 years for surveillance.  Thank you for allowing [place] to provide for your healthcare needs today.  ACTIVITY:  You should plan to take it easy for the rest of today and you should NOT DRIVE or use heavy machinery until tomorrow (because of the sedation medicines used during the test).    FOLLOW UP: Our staff will call the number listed on your records the next business day following your procedure.  We will call around 7:15- 8:00 am to check on you and address any questions or concerns that you may have regarding the information given to you following your procedure. If we do not reach you, we will leave a message.     If any biopsies were taken you will be contacted by phone or by letter within the next 1-3 weeks.  Please call us at [phone] if you have not heard about the biopsies in 3 weeks.    SIGNATURES/CONFIDENTIALITY: You and/or your care partner have signed paperwork which will be entered into your electronic medical record.  These signatures attest to the fact that that the information above on your After Visit Summary has been reviewed and is understood.  Full responsibility of the confidentiality of this discharge information lies with you and/or your care-partner.

## 2023-10-12 NOTE — Progress Notes (Signed)
Called to room to assist during endoscopic procedure.  Patient ID and intended procedure confirmed with present staff. Received instructions for my participation in the procedure from the performing physician.  

## 2023-10-12 NOTE — Progress Notes (Signed)
[facility] History and Physical   Primary Care Physician:  [name], NP   Reason for Procedure:  History of adenomatous colon polyps  Plan:    Surveillance colonoscopy with possible interventions as needed     HPI: Aaron [name] is a very pleasant 67 y.o. male here for surveillance colonoscopy. Denies any nausea, vomiting, abdominal pain, melena or bright red blood per rectum  The risks and benefits as well as alternatives of endoscopic procedure(s) have been discussed and reviewed. All questions answered. The patient agrees to proceed.    Past Medical History:  Diagnosis Date   Abdominal aortic aneurysm (AAA) without rupture (HCC) 11/01/2013   Abdominal aortic aneurysm [facility])    sees Dr. Jacinto Halim for cardiac f/u, [phone]   Arthritis    lower back   Asthma    followed by Dr. Samara Snide   Chronic airway obstruction, not elsewhere classified    Dyspnea    normal PFT April 2012   External hemorrhoids without mention of complication    GERD (gastroesophageal reflux disease)    Gout    H/O hiatal hernia    Hemorrhoids    HTN (hypertension)    hx of sees Dr. Alonna Minium   Hyperlipidemia    Insomnia, unspecified    Other abnormal blood chemistry    Other dyspnea and respiratory abnormality    Other malaise and fatigue    Other specified erythematous condition(695.89)    Other testicular dysfunction    Primary hypertension 01/20/2011   Prostatitis, unspecified    Rash 2011   left chest, biopsy 2012 Dr. Charlton Haws, results pending   Skin cancer    Squamous cell carcinoma of skin 11/03/2014   right shoulder cx3 17fu   Tobacco abuse     Past Surgical History:  Procedure Laterality Date   ABDOMINAL AORTIC ENDOVASCULAR STENT GRAFT  09/22/2012   Procedure: ABDOMINAL AORTIC ENDOVASCULAR STENT GRAFT;  Surgeon: Pryor Ochoa, MD;  Location: [facility] OR;  Service: Vascular;  Laterality: N/A;  GORE; ultrasound guided.   APPENDECTOMY     EVALUATION  UNDER ANESTHESIA WITH HEMORRHOIDECTOMY N/A 12/31/2016   Procedure: EXAM UNDER ANESTHESIA WITH HEMORRHOIDECTOMY;  Surgeon: [name], MD;  Location: [facility];  Service: General;  Laterality: N/A;   SKIN CANCER EXCISION     TOE SURGERY  01/2012   joint of left great toe   TONSILLECTOMY     TONSILLECTOMY     TYMPANOPLASTY     WRIST SURGERY     left    Prior to Admission medications   Medication Sig Start Date End Date Taking? Authorizing Provider  allopurinol (ZYLOPRIM) 100 MG tablet Take 1 tablet (100 mg total) by mouth daily. 03/06/23  Yes [name], NP  ALPRAZolam Prudy Feeler) 0.25 MG tablet Take 1 tablet (0.25 mg) by mouth daily as needed for anxiety (to last 30 days). 05/06/23  Yes Medina-Vargas, Monina C, NP  amLODipine (NORVASC) 10 MG tablet Take 1 tablet (10 mg total) by mouth daily. 04/21/23  Yes [name], NP  aspirin EC 81 MG tablet Take 1 tablet (81 mg total) by mouth daily. Swallow whole. 09/21/23  Yes [name], NP  buPROPion (WELLBUTRIN XL) 150 MG 24 hr tablet Take 1 tablet (150 mg total) by mouth daily. 09/07/23  Yes [name], NP  diclofenac (CATAFLAM) 50 MG tablet Take 1 tablet (50 mg total) by mouth 2 (two) times daily. 07/20/23  Yes   fluticasone-salmeterol (ADVAIR) 100-50 MCG/ACT AEPB  Inhale 1 puff into the lungs 2 (two) times daily. 08/27/23  Yes [name], NP  losartan (COZAAR) 100 MG tablet Take 1 tablet (100 mg total) by mouth daily. 01/19/23 10/18/23 Yes [name], NP  pantoprazole (PROTONIX) 40 MG tablet TAKE 1 TABLET BY MOUTH ONCE A DAY 12/15/22 12/15/23 Yes [name], NP  simvastatin (ZOCOR) 20 MG tablet Take 1 tablet (20 mg total) by mouth at bedtime. 06/02/23  Yes [name], NP  acetaminophen (TYLENOL) 500 MG tablet Take 2 tablets (1,000 mg total) by mouth every 6 (six) hours as needed. 06/03/22   [name], NP  albuterol (VENTOLIN HFA) 108 (90 Base) MCG/ACT inhaler Inhale 2  puffs into the lungs every 6 (six) hours as needed for cough and wheezing. 04/02/23   [name], NP  clindamycin (CLEOCIN-T) 1 % lotion Apply topically daily. 07/13/23 07/12/24  Terri Piedra, DO  colchicine 0.6 MG tablet Take 0.6 mg by mouth as needed. For gout flare up    [provider]  mupirocin ointment (BACTROBAN) 2 % Apply 1 Application topically 2 (two) times daily. 07/23/23   Terri Piedra, DO  sildenafil (VIAGRA) 50 MG tablet Take 1 tablet (50 mg) by mouth as needed for erectile dysfunction. 09/21/23   [name], NP    Current Outpatient Medications  Medication Sig Dispense Refill   allopurinol (ZYLOPRIM) 100 MG tablet Take 1 tablet (100 mg total) by mouth daily. 90 tablet 3   ALPRAZolam (XANAX) 0.25 MG tablet Take 1 tablet (0.25 mg) by mouth daily as needed for anxiety (to last 30 days). 20 tablet 5   amLODipine (NORVASC) 10 MG tablet Take 1 tablet (10 mg total) by mouth daily. 90 tablet 1   aspirin EC 81 MG tablet Take 1 tablet (81 mg total) by mouth daily. Swallow whole. 90 tablet 1   buPROPion (WELLBUTRIN XL) 150 MG 24 hr tablet Take 1 tablet (150 mg total) by mouth daily. 90 tablet 1   diclofenac (CATAFLAM) 50 MG tablet Take 1 tablet (50 mg total) by mouth 2 (two) times daily. 60 tablet 2   fluticasone-salmeterol (ADVAIR) 100-50 MCG/ACT AEPB Inhale 1 puff into the lungs 2 (two) times daily. 60 each 1   losartan (COZAAR) 100 MG tablet Take 1 tablet (100 mg total) by mouth daily. 90 tablet 3   pantoprazole (PROTONIX) 40 MG tablet TAKE 1 TABLET BY MOUTH ONCE A DAY 90 tablet 3   simvastatin (ZOCOR) 20 MG tablet Take 1 tablet (20 mg total) by mouth at bedtime. 90 tablet 3   acetaminophen (TYLENOL) 500 MG tablet Take 2 tablets (1,000 mg total) by mouth every 6 (six) hours as needed. 30 tablet 5   albuterol (VENTOLIN HFA) 108 (90 Base) MCG/ACT inhaler Inhale 2 puffs into the lungs every 6 (six) hours as needed for cough and wheezing. 20.1 g 3   clindamycin  (CLEOCIN-T) 1 % lotion Apply topically daily. 60 mL 3   colchicine 0.6 MG tablet Take 0.6 mg by mouth as needed. For gout flare up     mupirocin ointment (BACTROBAN) 2 % Apply 1 Application topically 2 (two) times daily. 30 g 5   sildenafil (VIAGRA) 50 MG tablet Take 1 tablet (50 mg) by mouth as needed for erectile dysfunction. 6 tablet 5   Current Facility-Administered Medications  Medication Dose Route Frequency Provider Last Rate Last Admin   0.9 %  sodium chloride infusion  500 mL Intravenous Once Ajay Strubel, Philbert Riser  V, MD        Allergies as of 10/12/2023 - Review Complete 10/12/2023  Allergen Reaction Noted   Singulair [montelukast]  08/17/2023    Family History  Problem Relation Age of Onset   Cancer Mother        Breast   Heart attack Father 72   COPD Father    Heart disease Father    Osler-Weber-Rendu syndrome Brother    Asthma Daughter    Lupus Daughter    Diabetes Other        Grandson    Colon cancer Neg Hx    Pancreatic cancer Neg Hx    Esophageal cancer Neg Hx    Stomach cancer Neg Hx    Rectal cancer Neg Hx     Social History   Socioeconomic History   Marital status: Married    Spouse name: Not on file   Number of children: Not on file   Years of education: Not on file   Highest education level: Some college, no degree  Occupational History   Occupation: Scientist, research (medical): [employer]  Tobacco Use   Smoking status: Former    Current packs/day: 0.00    Average packs/day: 0.3 packs/day for 38.0 years (9.5 ttl pk-yrs)    Types: Cigarettes    Start date: 10/31/1982    Quit date: 10/31/2020    Years since quitting: 2.9    Passive exposure: Never   Smokeless tobacco: Former    Types: Snuff    Quit date: 11/25/2011  Vaping Use   Vaping status: Never Used  Substance and Sexual Activity   Alcohol use: Yes    Alcohol/week: 0.0 standard drinks of alcohol    Comment: occ   Drug use: No   Sexual activity: Yes  Other Topics Concern   Not on file  Social  History Narrative   Not on file   Social Determinants of Health   Financial Resource Strain: Low Risk  (08/16/2023)   Overall Financial Resource Strain (CARDIA)    Difficulty of Paying Living Expenses: Not very hard  Food Insecurity: No Food Insecurity (08/16/2023)   Hunger Vital Sign    Worried About Running Out of Food in the Last Year: Never true    Ran Out of Food in the Last Year: Never true  Transportation Needs: No Transportation Needs (08/16/2023)   PRAPARE - Administrator, Civil Service (Medical): No    Lack of Transportation (Non-Medical): No  Physical Activity: Unknown (08/16/2023)   Exercise Vital Sign    Days of Exercise per Week: 0 days    Minutes of Exercise per Session: Not on file  Stress: Stress Concern Present (08/16/2023)   [employer] of Occupational Health - Occupational Stress Questionnaire    Feeling of Stress : Very much  Social Connections: Socially Integrated (08/16/2023)   Social Connection and Isolation Panel [NHANES]    Frequency of Communication with Friends and Family: More than three times a week    Frequency of Social Gatherings with Friends and Family: Patient declined    Attends Religious Services: More than 4 times per year    Active Member of [employer] or Organizations: Yes    Attends Engineer, structural: More than 4 times per year    Marital Status: Married  Catering manager Violence: Not on file    Review of Systems:  All other review of systems negative except as mentioned in the HPI.  Physical Exam: Vital signs  in last 24 hours: BP (!) 144/97   Pulse (!) 59   Temp 98 F (36.7 C)   Resp 13   Ht 6\' 1"  (1.854 m)   Wt 234 lb (106.1 kg)   SpO2 96%   BMI 30.87 kg/m  General:   Alert, NAD Lungs:  Clear .   Heart:  Regular rate and rhythm Abdomen:  Soft, nontender and nondistended. Neuro/Psych:  Alert and cooperative. Normal mood and affect. A and O x 3  Reviewed labs, radiology imaging, old records and  pertinent past GI work up  Patient is appropriate for planned procedure(s) and anesthesia in an ambulatory setting   K. Scherry Ran , MD [phone]

## 2023-10-13 ENCOUNTER — Other Ambulatory Visit (HOSPITAL_COMMUNITY): Payer: Self-pay

## 2023-10-13 ENCOUNTER — Telehealth: Payer: Self-pay | Admitting: *Deleted

## 2023-10-13 NOTE — Telephone Encounter (Signed)
  Follow up Call-     10/12/2023    7:24 AM  Call back number  Post procedure Call Back phone  # [phone]  Permission to leave phone message Yes     Patient questions:  Do you have a fever, pain , or abdominal swelling? No. Pain Score  0 *  Have you tolerated food without any problems? Yes.    Have you been able to return to your normal activities? Yes.    Do you have any questions about your discharge instructions: Diet   No. Medications  No. Follow up visit  No.  Do you have questions or concerns about your Care? No.  Actions: * If pain score is 4 or above: No action needed, pain <4.

## 2023-10-14 LAB — SURGICAL PATHOLOGY

## 2023-10-15 ENCOUNTER — Other Ambulatory Visit: Payer: Self-pay

## 2023-10-15 ENCOUNTER — Other Ambulatory Visit (HOSPITAL_COMMUNITY): Payer: Self-pay

## 2023-10-15 ENCOUNTER — Other Ambulatory Visit: Payer: Self-pay | Admitting: Nurse Practitioner

## 2023-10-15 DIAGNOSIS — I10 Essential (primary) hypertension: Secondary | ICD-10-CM

## 2023-10-15 MED ORDER — AMLODIPINE BESYLATE 10 MG PO TABS
10.0000 mg | ORAL_TABLET | Freq: Every day | ORAL | 1 refills | Status: DC
  Filled 2023-10-15: qty 90, 90d supply, fill #0
  Filled 2024-01-29: qty 90, 90d supply, fill #1

## 2023-10-20 ENCOUNTER — Other Ambulatory Visit: Payer: Self-pay

## 2023-10-20 ENCOUNTER — Ambulatory Visit (INDEPENDENT_AMBULATORY_CARE_PROVIDER_SITE_OTHER): Payer: Medicare Other

## 2023-10-20 ENCOUNTER — Ambulatory Visit (INDEPENDENT_AMBULATORY_CARE_PROVIDER_SITE_OTHER): Payer: Medicare Other | Admitting: Physician Assistant

## 2023-10-20 VITALS — BP 123/75 | HR 61 | Ht 73.0 in | Wt 231.0 lb

## 2023-10-20 DIAGNOSIS — Z95828 Presence of other vascular implants and grafts: Secondary | ICD-10-CM

## 2023-10-20 NOTE — Progress Notes (Signed)
Office Note     CC:  follow up Requesting Provider:  [name], NP  HPI: Aaron [name] is a 67 y.o. (Oct 27, 1956) male who presents for surveillance of endovascular repair of abdominal aortic aneurysm which was performed by Dr. Hart Rochester in 2013 due to a 5.1 cm infrarenal aneurysm.  He denies any new or changing abdominal or back pain.  He does have chronic low back pain that causes some numbness in his thighs if he stands for prolonged periods of time.  He tries to stay active and walk.  He does not have any claudication symptoms.  He is also without any tissue loss or rest pain of bilateral lower extremities.  He is a former smoker.  He takes an aspirin and a statin daily.  He follows annually with his PCP for chronic medical conditions including hyperlipidemia.   Past Medical History:  Diagnosis Date   Abdominal aortic aneurysm (AAA) without rupture (HCC) 11/01/2013   Abdominal aortic aneurysm [facility])    sees Dr. Jacinto Halim for cardiac f/u, [phone]   Arthritis    lower back   Asthma    followed by Dr. Samara Snide   Chronic airway obstruction, not elsewhere classified    Dyspnea    normal PFT April 2012   External hemorrhoids without mention of complication    GERD (gastroesophageal reflux disease)    Gout    H/O hiatal hernia    Hemorrhoids    HTN (hypertension)    hx of sees Dr. Alonna Minium   Hyperlipidemia    Insomnia, unspecified    Other abnormal blood chemistry    Other dyspnea and respiratory abnormality    Other malaise and fatigue    Other specified erythematous condition(695.89)    Other testicular dysfunction    Primary hypertension 01/20/2011   Prostatitis, unspecified    Rash 2011   left chest, biopsy 2012 Dr. Charlton Haws, results pending   Skin cancer    Squamous cell carcinoma of skin 11/03/2014   right shoulder cx3 33fu   Tobacco abuse     Past Surgical History:  Procedure Laterality Date   ABDOMINAL AORTIC ENDOVASCULAR STENT GRAFT  09/22/2012    Procedure: ABDOMINAL AORTIC ENDOVASCULAR STENT GRAFT;  Surgeon: Pryor Ochoa, MD;  Location: [facility] OR;  Service: Vascular;  Laterality: N/A;  GORE; ultrasound guided.   APPENDECTOMY     COLONOSCOPY N/A 09/2023   EVALUATION UNDER ANESTHESIA WITH HEMORRHOIDECTOMY N/A 12/31/2016   Procedure: EXAM UNDER ANESTHESIA WITH HEMORRHOIDECTOMY;  Surgeon: [name], MD;  Location: [facility];  Service: General;  Laterality: N/A;   SKIN CANCER EXCISION     TOE SURGERY  01/2012   joint of left great toe   TONSILLECTOMY     TONSILLECTOMY     TYMPANOPLASTY     WRIST SURGERY     left    Social History   Socioeconomic History   Marital status: Married    Spouse name: Not on file   Number of children: Not on file   Years of education: Not on file   Highest education level: Some college, no degree  Occupational History   Occupation: CNA    Employer: [employer]  Tobacco Use   Smoking status: Former    Current packs/day: 0.00    Average packs/day: 0.3 packs/day for 38.0 years (9.5 ttl pk-yrs)    Types: Cigarettes    Start date: 10/31/1982    Quit date: 10/31/2020    Years since quitting: 2.9  Passive exposure: Never   Smokeless tobacco: Former    Types: Snuff    Quit date: 11/25/2011  Vaping Use   Vaping status: Never Used  Substance and Sexual Activity   Alcohol use: Yes    Alcohol/week: 0.0 standard drinks of alcohol    Comment: occ   Drug use: No   Sexual activity: Yes  Other Topics Concern   Not on file  Social History Narrative   Not on file   Social Determinants of Health   Financial Resource Strain: Low Risk  (08/16/2023)   Overall Financial Resource Strain (CARDIA)    Difficulty of Paying Living Expenses: Not very hard  Food Insecurity: No Food Insecurity (08/16/2023)   Hunger Vital Sign    Worried About Running Out of Food in the Last Year: Never true    Ran Out of Food in the Last Year: Never true  Transportation Needs: No Transportation Needs  (08/16/2023)   PRAPARE - Administrator, Civil Service (Medical): No    Lack of Transportation (Non-Medical): No  Physical Activity: Unknown (08/16/2023)   Exercise Vital Sign    Days of Exercise per Week: 0 days    Minutes of Exercise per Session: Not on file  Stress: Stress Concern Present (08/16/2023)   [employer] of Occupational Health - Occupational Stress Questionnaire    Feeling of Stress : Very much  Social Connections: Socially Integrated (08/16/2023)   Social Connection and Isolation Panel [NHANES]    Frequency of Communication with Friends and Family: More than three times a week    Frequency of Social Gatherings with Friends and Family: Patient declined    Attends Religious Services: More than 4 times per year    Active Member of [employer] or Organizations: Yes    Attends Engineer, structural: More than 4 times per year    Marital Status: Married  Catering manager Violence: Not on file    Family History  Problem Relation Age of Onset   Cancer Mother        Breast   Heart attack Father 11   COPD Father    Heart disease Father    Osler-Weber-Rendu syndrome Brother    Asthma Daughter    Lupus Daughter    Diabetes Other        Grandson    Colon cancer Neg Hx    Pancreatic cancer Neg Hx    Esophageal cancer Neg Hx    Stomach cancer Neg Hx    Rectal cancer Neg Hx     Current Outpatient Medications  Medication Sig Dispense Refill   acetaminophen (TYLENOL) 500 MG tablet Take 2 tablets (1,000 mg total) by mouth every 6 (six) hours as needed. 30 tablet 5   albuterol (VENTOLIN HFA) 108 (90 Base) MCG/ACT inhaler Inhale 2 puffs into the lungs every 6 (six) hours as needed for cough and wheezing. 20.1 g 3   allopurinol (ZYLOPRIM) 100 MG tablet Take 1 tablet (100 mg total) by mouth daily. 90 tablet 3   ALPRAZolam (XANAX) 0.25 MG tablet Take 1 tablet (0.25 mg) by mouth daily as needed for anxiety (to last 30 days). 20 tablet 5   amLODipine (NORVASC) 10 MG  tablet Take 1 tablet (10 mg total) by mouth daily. 90 tablet 1   aspirin EC 81 MG tablet Take 1 tablet (81 mg total) by mouth daily. Swallow whole. 90 tablet 1   buPROPion (WELLBUTRIN XL) 150 MG 24 hr tablet Take 1 tablet (150 mg  total) by mouth daily. 90 tablet 1   clindamycin (CLEOCIN-T) 1 % lotion Apply topically daily. 60 mL 3   colchicine 0.6 MG tablet Take 0.6 mg by mouth as needed. For gout flare up     diclofenac (CATAFLAM) 50 MG tablet Take 1 tablet (50 mg total) by mouth 2 (two) times daily. 60 tablet 2   fluticasone-salmeterol (ADVAIR) 100-50 MCG/ACT AEPB Inhale 1 puff into the lungs 2 (two) times daily. 60 each 1   mupirocin ointment (BACTROBAN) 2 % Apply 1 Application topically 2 (two) times daily. 30 g 5   pantoprazole (PROTONIX) 40 MG tablet TAKE 1 TABLET BY MOUTH ONCE A DAY 90 tablet 3   sildenafil (VIAGRA) 50 MG tablet Take 1 tablet (50 mg) by mouth as needed for erectile dysfunction. 6 tablet 5   simvastatin (ZOCOR) 20 MG tablet Take 1 tablet (20 mg total) by mouth at bedtime. 90 tablet 3   losartan (COZAAR) 100 MG tablet Take 1 tablet (100 mg total) by mouth daily. 90 tablet 3   Current Facility-Administered Medications  Medication Dose Route Frequency Provider Last Rate Last Admin   0.9 %  sodium chloride infusion  500 mL Intravenous Once Nandigam, Eleonore Chiquito, MD        Allergies  Allergen Reactions   Singulair [Montelukast]      REVIEW OF SYSTEMS:   [X]  denotes positive finding, [ ]  denotes negative finding Cardiac  Comments:  Chest pain or chest pressure:    Shortness of breath upon exertion:    Short of breath when lying flat:    Irregular heart rhythm:        Vascular    Pain in calf, thigh, or hip brought on by ambulation:    Pain in feet at night that wakes you up from your sleep:     Blood clot in your veins:    Leg swelling:         Pulmonary    Oxygen at home:    Productive cough:     Wheezing:         Neurologic    Sudden weakness in arms or  legs:     Sudden numbness in arms or legs:     Sudden onset of difficulty speaking or slurred speech:    Temporary loss of vision in one eye:     Problems with dizziness:         Gastrointestinal    Blood in stool:     Vomited blood:         Genitourinary    Burning when urinating:     Blood in urine:        Psychiatric    Major depression:         Hematologic    Bleeding problems:    Problems with blood clotting too easily:        Skin    Rashes or ulcers:        Constitutional    Fever or chills:      PHYSICAL EXAMINATION:  Vitals:   10/20/23 0829  BP: 123/75  Pulse: 61  Weight: 231 lb (104.8 kg)  Height: 6\' 1"  (1.854 m)    General:  WDWN in NAD; vital signs documented above Gait: Not observed HENT: WNL, normocephalic Pulmonary: normal non-labored breathing , without Rales, rhonchi,  wheezing Cardiac: regular HR Abdomen: soft, NT, no masses Skin: without rashes Vascular Exam/Pulses: Palpable and symmetrical radial and DP pulses Extremities: without ischemic changes, without  Gangrene , without cellulitis; without open wounds;  Musculoskeletal: no muscle wasting or atrophy  Neurologic: A&O X 3 Psychiatric:  The pt has Normal affect.   Non-Invasive Vascular Imaging:   4.25 cm aneurysmal sac at the largest diameter No endoleaks noted    ASSESSMENT/PLAN:: 67 y.o. male here for follow up for surveillance of endovascular repair of abdominal aortic aneurysm  Subjectively, the patient has not noticed any new or changing abdominal or back pain.  On exam bilateral lower extremities are well-perfused with palpable and symmetrical DP pulses.  EVAR duplex demonstrates a stable appearing aneurysmal sac measuring 4.25 cm at the largest diameter.  No endoleaks were noted.  He will continue his aspirin and statin daily.  We will repeat EVAR duplex in 1 year per protocol.  He knows to call/return office sooner with any questions or concerns.   Emilie Rutter,  PA-C [facility] [phone]  Clinic MD:   Randie Heinz on-call

## 2023-10-26 ENCOUNTER — Other Ambulatory Visit (HOSPITAL_COMMUNITY): Payer: Self-pay

## 2023-10-26 MED ORDER — DICLOFENAC POTASSIUM 50 MG PO TABS
50.0000 mg | ORAL_TABLET | Freq: Two times a day (BID) | ORAL | 2 refills | Status: DC
  Filled 2023-10-26: qty 60, 30d supply, fill #0
  Filled 2023-11-23: qty 60, 30d supply, fill #1
  Filled 2023-12-21: qty 60, 30d supply, fill #2

## 2023-10-27 ENCOUNTER — Other Ambulatory Visit: Payer: Self-pay

## 2023-10-27 DIAGNOSIS — Z95828 Presence of other vascular implants and grafts: Secondary | ICD-10-CM

## 2023-10-28 ENCOUNTER — Other Ambulatory Visit: Payer: Self-pay

## 2023-11-04 ENCOUNTER — Encounter: Payer: Self-pay | Admitting: Gastroenterology

## 2023-11-12 ENCOUNTER — Other Ambulatory Visit (HOSPITAL_COMMUNITY): Payer: Self-pay

## 2023-11-12 ENCOUNTER — Other Ambulatory Visit: Payer: Self-pay | Admitting: Nurse Practitioner

## 2023-11-13 ENCOUNTER — Other Ambulatory Visit (HOSPITAL_BASED_OUTPATIENT_CLINIC_OR_DEPARTMENT_OTHER): Payer: Self-pay

## 2023-11-13 ENCOUNTER — Other Ambulatory Visit: Payer: Self-pay

## 2023-11-13 ENCOUNTER — Encounter (HOSPITAL_COMMUNITY): Payer: Self-pay

## 2023-11-13 ENCOUNTER — Other Ambulatory Visit (HOSPITAL_COMMUNITY): Payer: Self-pay

## 2023-11-13 MED ORDER — FLUTICASONE-SALMETEROL 100-50 MCG/ACT IN AEPB
1.0000 | INHALATION_SPRAY | Freq: Two times a day (BID) | RESPIRATORY_TRACT | 1 refills | Status: DC
  Filled 2023-11-13 (×4): qty 60, 30d supply, fill #0
  Filled 2023-12-09: qty 60, 30d supply, fill #1

## 2023-11-20 DIAGNOSIS — H524 Presbyopia: Secondary | ICD-10-CM | POA: Diagnosis not present

## 2023-11-20 DIAGNOSIS — H52223 Regular astigmatism, bilateral: Secondary | ICD-10-CM | POA: Diagnosis not present

## 2023-11-20 DIAGNOSIS — H5213 Myopia, bilateral: Secondary | ICD-10-CM | POA: Diagnosis not present

## 2023-11-24 ENCOUNTER — Other Ambulatory Visit: Payer: Self-pay | Admitting: Adult Health

## 2023-11-24 ENCOUNTER — Other Ambulatory Visit: Payer: Self-pay

## 2023-11-24 ENCOUNTER — Other Ambulatory Visit (HOSPITAL_COMMUNITY): Payer: Self-pay

## 2023-11-24 DIAGNOSIS — F419 Anxiety disorder, unspecified: Secondary | ICD-10-CM

## 2023-11-24 MED ORDER — ALPRAZOLAM 0.25 MG PO TABS
0.2500 mg | ORAL_TABLET | Freq: Every day | ORAL | 5 refills | Status: DC | PRN
  Filled 2023-11-24 – 2023-11-27 (×2): qty 20, 20d supply, fill #0
  Filled 2023-12-21 – 2024-01-02 (×2): qty 20, 20d supply, fill #1
  Filled 2024-01-29 – 2024-02-03 (×3): qty 20, 20d supply, fill #2
  Filled 2024-03-07: qty 20, 20d supply, fill #3
  Filled 2024-04-13: qty 20, 20d supply, fill #4
  Filled 2024-05-17: qty 20, 20d supply, fill #5

## 2023-11-24 NOTE — Telephone Encounter (Signed)
 Patient is requesting a refill of the following medications: Requested Prescriptions   Pending Prescriptions Disp Refills   ALPRAZolam  (XANAX ) 0.25 MG tablet 20 tablet 5    Sig: Take 1 tablet (0.25 mg) by mouth daily as needed for anxiety (to last 30 days).    Date of last refill: 05/06/2023  Refill amount: 0  Treatment agreement date: 01/02/2022

## 2023-11-27 ENCOUNTER — Other Ambulatory Visit: Payer: Self-pay

## 2023-11-27 ENCOUNTER — Other Ambulatory Visit (HOSPITAL_COMMUNITY): Payer: Self-pay

## 2023-12-09 ENCOUNTER — Other Ambulatory Visit (HOSPITAL_COMMUNITY): Payer: Self-pay

## 2023-12-17 ENCOUNTER — Other Ambulatory Visit (HOSPITAL_COMMUNITY): Payer: Self-pay

## 2023-12-21 ENCOUNTER — Other Ambulatory Visit (HOSPITAL_COMMUNITY): Payer: Self-pay

## 2023-12-21 ENCOUNTER — Other Ambulatory Visit: Payer: Self-pay

## 2023-12-21 ENCOUNTER — Other Ambulatory Visit: Payer: Self-pay | Admitting: Nurse Practitioner

## 2023-12-21 MED ORDER — FLUTICASONE-SALMETEROL 100-50 MCG/ACT IN AEPB
1.0000 | INHALATION_SPRAY | Freq: Two times a day (BID) | RESPIRATORY_TRACT | 1 refills | Status: DC
  Filled 2023-12-21 – 2024-01-11 (×2): qty 60, 30d supply, fill #0
  Filled 2024-02-12: qty 60, 30d supply, fill #1

## 2024-01-02 ENCOUNTER — Other Ambulatory Visit (HOSPITAL_COMMUNITY): Payer: Self-pay

## 2024-01-03 ENCOUNTER — Other Ambulatory Visit: Payer: Self-pay

## 2024-01-11 ENCOUNTER — Encounter: Payer: Self-pay | Admitting: Dermatology

## 2024-01-11 ENCOUNTER — Ambulatory Visit: Payer: Medicare Other | Admitting: Dermatology

## 2024-01-11 ENCOUNTER — Other Ambulatory Visit: Payer: Self-pay

## 2024-01-11 ENCOUNTER — Other Ambulatory Visit (HOSPITAL_COMMUNITY): Payer: Self-pay

## 2024-01-11 ENCOUNTER — Other Ambulatory Visit: Payer: Self-pay | Admitting: Nurse Practitioner

## 2024-01-11 VITALS — BP 125/79

## 2024-01-11 DIAGNOSIS — L720 Epidermal cyst: Secondary | ICD-10-CM | POA: Diagnosis not present

## 2024-01-11 DIAGNOSIS — Z1283 Encounter for screening for malignant neoplasm of skin: Secondary | ICD-10-CM

## 2024-01-11 DIAGNOSIS — L739 Follicular disorder, unspecified: Secondary | ICD-10-CM | POA: Diagnosis not present

## 2024-01-11 DIAGNOSIS — L409 Psoriasis, unspecified: Secondary | ICD-10-CM

## 2024-01-11 DIAGNOSIS — L570 Actinic keratosis: Secondary | ICD-10-CM | POA: Diagnosis not present

## 2024-01-11 DIAGNOSIS — K219 Gastro-esophageal reflux disease without esophagitis: Secondary | ICD-10-CM

## 2024-01-11 MED ORDER — PANTOPRAZOLE SODIUM 40 MG PO TBEC
40.0000 mg | DELAYED_RELEASE_TABLET | Freq: Every day | ORAL | 3 refills | Status: DC
  Filled 2024-01-11: qty 90, 90d supply, fill #0
  Filled 2024-04-13: qty 90, 90d supply, fill #1
  Filled 2024-07-13: qty 90, 90d supply, fill #2
  Filled 2024-10-13: qty 90, 90d supply, fill #3

## 2024-01-11 MED ORDER — TRIAMCINOLONE ACETONIDE 0.1 % EX OINT
1.0000 | TOPICAL_OINTMENT | Freq: Two times a day (BID) | CUTANEOUS | 0 refills | Status: DC
  Filled 2024-01-11: qty 80, 30d supply, fill #0

## 2024-01-11 NOTE — Patient Instructions (Addendum)

## 2024-01-11 NOTE — Progress Notes (Signed)
 Follow-Up Visit   Subjective  Aaron [name] is a 68 y.o. male who presents for the following: TBSE   Patient present today for follow up visit. Patient was last evaluated on 07/13/23. At this visit patient was prescribed Clindamycin 1% lotion (folliculitis) & Mupirocin 2 % (impetigo). Patient reports sxs are better but not completely gone. Patient denies medication changes.  The following portions of the chart were reviewed this encounter and updated as appropriate: medications, allergies, medical history  Review of Systems:  No other skin or systemic complaints except as noted in HPI or Assessment and Plan.  Objective  Well appearing patient in no apparent distress; mood and affect are within normal limits.  A full examination was performed including scalp, head, eyes, ears, nose, lips, neck, chest, axillae, abdomen, back, buttocks, bilateral upper extremities, bilateral lower extremities, hands, feet, fingers, toes, fingernails, and toenails. All findings within normal limits unless otherwise noted below.    Relevant exam findings are noted in the Assessment and Plan.  Chest - [facility]) (3), Right Buccal Cheek, Right Forearm - Anterior Erythematous thin papules/macules with gritty scale. Erythematous thin papules/macules with gritty scale.   Assessment & Plan   FOLLICULITIS Exam: Perifollicular erythematous papules and pustules  Folliculitis occurs due to inflammation of the superficial hair follicle (pore), resulting in acne-like lesions (pus bumps). It can be infectious (bacterial, fungal) or noninfectious (shaving, tight clothing, heat/sweat, medications).  Folliculitis can be acute or chronic and recommended treatment depends on the underlying cause of folliculitis.  Treatment Plan: - Continue applying the clindamycin 1% lotion   PSORIASIS Exam: Well-demarcated erythematous papules/plaques with silvery scale, guttate pink scaly papules behind bilateral the ears &  preauricular areas of the face. 1 % BSA.  flared  Psoriasis is a chronic non-curable, but treatable genetic/hereditary disease that may have other systemic features affecting other organ systems such as joints (Psoriatic Arthritis). It is associated with an increased risk of inflammatory bowel disease, heart disease, non-alcoholic fatty liver disease, and depression.  Treatments include light and laser treatments; topical medications; and systemic medications including oral and injectables.  Treatment Plan: - Rx triamcinolone 0.1% - apply BID for 2 weeks then stop for 2 weeks. Repeat if needed.   EPIDERMAL INCLUSION CYST Exam: 1cm Subcutaneous nodule at left arm  Benign-appearing. Exam most consistent with an epidermal inclusion cyst. Discussed that a cyst is a benign growth that can grow over time and sometimes get irritated or inflamed. Recommend observation if it is not bothersome. Discussed option of surgical excision to remove it if it is growing, symptomatic, or other changes noted. Please call for new or changing lesions so they can be evaluated.   PSORIASIS   AK (ACTINIC KERATOSIS) (5) Chest - Medial (Center) (3), Right Buccal Cheek, Right Forearm - Anterior Destruction of lesion - Chest - Medial (Center) (3), Right Buccal Cheek, Right Forearm - Anterior Complexity: simple   Destruction method: cryotherapy   Informed consent: discussed and consent obtained   Timeout:  patient name, date of birth, surgical site, and procedure verified Lesion destroyed using liquid nitrogen: Yes   Region frozen until ice ball extended beyond lesion: Yes   Outcome: patient tolerated procedure well with no complications   Post-procedure details: wound care instructions given     Return in about 7 months (around 08/10/2024) for TBSE.    Documentation: I have reviewed the above documentation for accuracy and completeness, and I agree with the above.   I, Renelda Kilian Marcha Solders, CMA, am acting  as  scribe for Langston Reusing, DO.   Langston Reusing, DO

## 2024-01-29 ENCOUNTER — Other Ambulatory Visit: Payer: Self-pay | Admitting: Nurse Practitioner

## 2024-01-29 ENCOUNTER — Other Ambulatory Visit (HOSPITAL_COMMUNITY): Payer: Self-pay

## 2024-01-29 DIAGNOSIS — I10 Essential (primary) hypertension: Secondary | ICD-10-CM

## 2024-01-30 ENCOUNTER — Other Ambulatory Visit: Payer: Self-pay | Admitting: Nurse Practitioner

## 2024-01-30 ENCOUNTER — Other Ambulatory Visit (HOSPITAL_COMMUNITY): Payer: Self-pay

## 2024-01-30 DIAGNOSIS — I10 Essential (primary) hypertension: Secondary | ICD-10-CM

## 2024-02-01 ENCOUNTER — Other Ambulatory Visit (HOSPITAL_COMMUNITY): Payer: Self-pay

## 2024-02-01 MED ORDER — DICLOFENAC POTASSIUM 50 MG PO TABS
50.0000 mg | ORAL_TABLET | Freq: Two times a day (BID) | ORAL | 2 refills | Status: DC
Start: 2024-02-01 — End: 2024-09-12
  Filled 2024-02-01: qty 60, 30d supply, fill #0
  Filled 2024-02-03: qty 40, 20d supply, fill #0
  Filled 2024-02-12: qty 40, 20d supply, fill #1
  Filled 2024-03-15: qty 40, 20d supply, fill #2
  Filled 2024-04-13: qty 40, 20d supply, fill #3
  Filled 2024-04-25 – 2024-05-30 (×3): qty 20, 10d supply, fill #4

## 2024-02-02 ENCOUNTER — Other Ambulatory Visit (HOSPITAL_COMMUNITY): Payer: Self-pay

## 2024-02-02 ENCOUNTER — Other Ambulatory Visit: Payer: Self-pay

## 2024-02-02 MED ORDER — LOSARTAN POTASSIUM 100 MG PO TABS
100.0000 mg | ORAL_TABLET | Freq: Every day | ORAL | 3 refills | Status: AC
  Filled 2024-02-02 – 2024-02-12 (×2): qty 90, 90d supply, fill #0
  Filled 2024-05-17: qty 90, 90d supply, fill #1
  Filled 2024-08-11 (×2): qty 90, 90d supply, fill #2
  Filled 2024-11-08: qty 90, 90d supply, fill #3

## 2024-02-02 NOTE — Telephone Encounter (Signed)
 Patient medication has an end date. Medication pend and sent to PCP Janyth Contes Janene Harvey, NP for approval.

## 2024-02-03 ENCOUNTER — Other Ambulatory Visit: Payer: Self-pay

## 2024-02-03 ENCOUNTER — Other Ambulatory Visit (HOSPITAL_COMMUNITY): Payer: Self-pay

## 2024-02-04 ENCOUNTER — Other Ambulatory Visit (HOSPITAL_COMMUNITY): Payer: Self-pay

## 2024-02-11 ENCOUNTER — Ambulatory Visit: Payer: Medicare Other | Admitting: Dermatology

## 2024-02-12 ENCOUNTER — Other Ambulatory Visit (HOSPITAL_COMMUNITY): Payer: Self-pay

## 2024-02-13 ENCOUNTER — Other Ambulatory Visit (HOSPITAL_COMMUNITY): Payer: Self-pay

## 2024-02-15 ENCOUNTER — Ambulatory Visit: Payer: 59 | Admitting: Nurse Practitioner

## 2024-02-17 ENCOUNTER — Ambulatory Visit: Payer: Self-pay

## 2024-02-17 NOTE — Telephone Encounter (Signed)
 Chief Complaint: Cough Symptoms: Dry cough, sometimes has green sputum, temp 99.1, wheezing last night Frequency: Come and go  Pertinent Negatives: Patient denies chest pain, SOB, dehydration  Disposition: [] ED /[] Urgent Care (no appt availability in office) / [x] Appointment(In office/virtual)/ []  [place] [facility]/ [] Home Care/ [] Refused Recommended Disposition /[] [place] [facility]/ []  Follow-up with PCP Additional Notes: Patient states he has had a dry cough for about 2 weeks but now he is producing green sputum. Patient states this happens about once a year for him and he is usually prescribed an antibiotic. Care advice given and patient already has first available appointment with PCP on Monday. Patient is requesting treatment before his appointment [facility] [place]. Advised I would forward request to PCP for recommendations. Copied from CRM [id number]. Topic: Clinical - Red Word Triage >> Feb 17, 2024 11:30 AM Everette Rank wrote: Red Word that prompted transfer to Nurse Triage: bronchitis issue Low grade fever/99.1 for 2 weeks but 2-3 days Reason for Disposition  [1] Continuous (nonstop) coughing interferes with work or school AND [2] no improvement using cough treatment per Care Advice  Answer Assessment - Initial Assessment Questions 1. ONSET: "When did the cough begin?"      2 weeks ago  2. SEVERITY: "How bad is the cough today?"      Moderate  3. SPUTUM: "Describe the color of your sputum" (none, dry cough; clear, white, yellow, green)     Green  4. HEMOPTYSIS: "Are you coughing up any blood?" If so ask: "How much?" (flecks, streaks, tablespoons, etc.)     No  5. DIFFICULTY BREATHING: "Are you having difficulty breathing?" If Yes, ask: "How bad is it?" (e.g., mild, moderate, severe)    - MILD: No SOB at rest, mild SOB with walking, speaks normally in sentences, can lie down, no retractions, pulse < 100.    - MODERATE: SOB at rest, SOB with minimal  exertion and prefers to sit, cannot lie down flat, speaks in phrases, mild retractions, audible wheezing, pulse 100-120.    - SEVERE: Very SOB at rest, speaks in single words, struggling to breathe, sitting hunched forward, retractions, pulse > 120      No  6. FEVER: "Do you have a fever?" If Yes, ask: "What is your temperature, how was it measured, and when did it start?"     99.1 last night 7. CARDIAC HISTORY: "Do you have any history of heart disease?" (e.g., heart attack, congestive heart failure)      N/A 8. LUNG HISTORY: "Do you have any history of lung disease?"  (e.g., pulmonary embolus, asthma, emphysema)     Asthma 9. PE RISK FACTORS: "Do you have a history of blood clots?" (or: recent major surgery, recent prolonged travel, bedridden)     N/A  10. OTHER SYMPTOMS: "Do you have any other symptoms?" (e.g., runny nose, wheezing, chest pain)       Wheezing last night  12. TRAVEL: "Have you traveled out of the country in the last month?" (e.g., travel history, exposures)       No  Protocols used: Cough - Acute Non-Productive-A-AH

## 2024-02-19 ENCOUNTER — Ambulatory Visit (INDEPENDENT_AMBULATORY_CARE_PROVIDER_SITE_OTHER): Admitting: Adult Health

## 2024-02-19 ENCOUNTER — Encounter: Payer: Self-pay | Admitting: Nurse Practitioner

## 2024-02-19 ENCOUNTER — Ambulatory Visit: Payer: Self-pay

## 2024-02-19 VITALS — BP 114/80 | HR 70 | Temp 98.1°F | Resp 21 | Ht 73.0 in | Wt 230.6 lb

## 2024-02-19 DIAGNOSIS — I10 Essential (primary) hypertension: Secondary | ICD-10-CM

## 2024-02-19 DIAGNOSIS — J209 Acute bronchitis, unspecified: Secondary | ICD-10-CM | POA: Diagnosis not present

## 2024-02-19 DIAGNOSIS — F418 Other specified anxiety disorders: Secondary | ICD-10-CM | POA: Diagnosis not present

## 2024-02-19 MED ORDER — VITAMIN C 1000 MG PO TABS: 1000.0000 mg | ORAL_TABLET | Freq: Every day | ORAL | Status: AC

## 2024-02-19 MED ORDER — GUAIFENESIN ER 600 MG PO TB12: 600.0000 mg | ORAL_TABLET | Freq: Two times a day (BID) | ORAL | Status: AC

## 2024-02-19 MED ORDER — PREDNISONE 20 MG PO TABS: ORAL_TABLET | ORAL | 0 refills | Status: AC

## 2024-02-19 NOTE — Progress Notes (Signed)
 [facility] clinic  Provider:  Kenard Gower DNP  Code Status:  Full Code  Goals of Care:     02/25/2024    9:17 AM  Advanced Directives  Does Patient Have a Medical Advance Directive? No  Would patient like information on creating a medical advance directive? No - Patient declined   Discussed the use of AI scribe software for clinical note transcription with the patient, who gave verbal consent to proceed.  Chief Complaint  Patient presents with   Cough    Discussed the use of AI scribe software for clinical note transcription with the patient, who gave verbal consent to proceed.  HPI: Patient is a 68 y.o. male seen today for an acute visit for cough.  He has been experiencing a cough that began three weeks ago. Initially, the cough lasted for about two weeks and then subsided, only to return this week with white phlegm. He occasionally notices green nasal discharge. No fever or chills are present.  He experiences occasional wheezing, particularly at night, as noted by his wife. He manages this with an emergency inhaler. He has a history of asthma and uses Advair twice daily. Albuterol is used as needed, especially during wheezing episodes or after physical exertion.  He has been taking Robitussin every four hours as needed, which he finds helpful. In the past, he has been treated with steroids like prednisone for similar cough episodes, which he describes as a 'bronchitis cough' that occurs annually.  He is a former smoker, having quit several years ago, and denies current smoking. 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 He is currently off work for two weeks and not driving. He takes alprazolam as needed for anxiety and Wellbutrin for depression.    Past Medical History:  Diagnosis Date   Abdominal aortic aneurysm (AAA) without rupture (HCC) 11/01/2013   Abdominal aortic aneurysm [facility])    sees Dr. Jacinto Halim for cardiac f/u,  [phone]   Arthritis    lower back   Asthma    followed by Dr. Samara Snide   Chronic airway obstruction, not elsewhere classified    Dyspnea    normal PFT April 2012   External hemorrhoids without mention of complication    GERD (gastroesophageal reflux disease)    Gout    H/O hiatal hernia    Hemorrhoids    HTN (hypertension)    hx of sees Dr. Alonna Minium   Hyperlipidemia    Insomnia, unspecified    Other abnormal blood chemistry    Other dyspnea and respiratory abnormality    Other malaise and fatigue    Other specified erythematous condition(695.89)    Other testicular dysfunction    Primary hypertension 01/20/2011   Prostatitis, unspecified    Rash 2011   left chest, biopsy 2012 Dr. Charlton Haws, results pending   Skin cancer    Squamous cell carcinoma of skin 11/03/2014   right shoulder cx3 64fu   Tobacco abuse     Past Surgical History:  Procedure Laterality Date   ABDOMINAL AORTIC ENDOVASCULAR STENT GRAFT  09/22/2012   Procedure: ABDOMINAL AORTIC ENDOVASCULAR STENT GRAFT;  Surgeon: Pryor Ochoa, MD;  Location: [facility] OR;  Service: Vascular;  Laterality: N/A;  GORE; ultrasound guided.   APPENDECTOMY     COLONOSCOPY N/A 09/2023   EVALUATION UNDER ANESTHESIA WITH HEMORRHOIDECTOMY N/A 12/31/2016   Procedure: EXAM UNDER ANESTHESIA WITH HEMORRHOIDECTOMY;  Surgeon: [name], MD;  Location: [facility];  Service: General;  Laterality: N/A;   SKIN CANCER EXCISION     TOE SURGERY  01/2012   joint of left great toe   TONSILLECTOMY     TONSILLECTOMY     TYMPANOPLASTY     WRIST SURGERY     left    Allergies  Allergen Reactions   Singulair [Montelukast]     Outpatient Encounter Medications as of 02/19/2024  Medication Sig   acetaminophen (TYLENOL) 500 MG tablet Take 2 tablets (1,000 mg total) by mouth every 6 (six) hours as needed.   albuterol (VENTOLIN HFA) 108 (90 Base) MCG/ACT inhaler Inhale 2 puffs into the lungs every 6 (six) hours as needed  for cough and wheezing.   allopurinol (ZYLOPRIM) 100 MG tablet Take 1 tablet (100 mg total) by mouth daily.   ALPRAZolam (XANAX) 0.25 MG tablet Take 1 tablet (0.25 mg) by mouth daily as needed for anxiety (to last 30 days).   amLODipine (NORVASC) 10 MG tablet Take 1 tablet (10 mg total) by mouth daily.   [EXPIRED] Ascorbic Acid (VITAMIN C) 1000 MG tablet Take 1 tablet (1,000 mg total) by mouth daily for 7 days.   aspirin EC 81 MG tablet Take 1 tablet (81 mg total) by mouth daily. Swallow whole.   clindamycin (CLEOCIN-T) 1 % lotion Apply topically daily.   colchicine 0.6 MG tablet Take 0.6 mg by mouth as needed. For gout flare up   diclofenac (CATAFLAM) 50 MG tablet Take 1 tablet (50 mg total) by mouth 2 (two) times daily.   fluticasone-salmeterol (ADVAIR) 100-50 MCG/ACT AEPB Inhale 1 puff into the lungs 2 (two) times daily.   guaiFENesin (MUCINEX) 600 MG 12 hr tablet Take 1 tablet (600 mg total) by mouth 2 (two) times daily for 14 days.   losartan (COZAAR) 100 MG tablet Take 1 tablet (100 mg total) by mouth daily.   pantoprazole (PROTONIX) 40 MG tablet Take 1 tablet (40 mg total) by mouth daily.   [EXPIRED] predniSONE (DELTASONE) 20 MG tablet Take 1 tablet (20 mg total) by mouth daily with breakfast for 3 days, THEN 0.5 tablets (10 mg total) daily with breakfast for 3 days.   sildenafil (VIAGRA) 50 MG tablet Take 1 tablet (50 mg) by mouth as needed for erectile dysfunction.   simvastatin (ZOCOR) 20 MG tablet Take 1 tablet (20 mg total) by mouth at bedtime.   triamcinolone ointment (KENALOG) 0.1 % Apply 1 Application topically 2 (two) times daily. Use for 2 weeks, then stop for 2 weeks. Repeat if needed.   [DISCONTINUED] buPROPion (WELLBUTRIN XL) 150 MG 24 hr tablet Take 1 tablet (150 mg total) by mouth daily.   Facility-Administered Encounter Medications as of 02/19/2024  Medication   0.9 %  sodium chloride infusion    Review of Systems:  Review of Systems  Constitutional:  Negative for  activity change, appetite change and fever.  HENT:  Negative for sore throat.   Eyes: Negative.   Respiratory:  Positive for cough and wheezing.   Cardiovascular:  Negative for chest pain and leg swelling.  Gastrointestinal:  Negative for abdominal distention, diarrhea and vomiting.  Genitourinary:  Negative for dysuria, frequency and urgency.  Skin:  Negative for color change.  Neurological:  Negative for dizziness and headaches.  Psychiatric/Behavioral:  Negative for behavioral problems and sleep disturbance. The patient is not nervous/anxious.     Health Maintenance  Topic Date Due   DTaP/Tdap/Td (2 - Td or Tdap) 12/07/2023   INFLUENZA VACCINE  06/24/2024   Medicare Annual Wellness (AWV)  02/24/2025   Colonoscopy  10/11/2028   Pneumonia Vaccine 63+ Years old  Completed   Zoster Vaccines- Shingrix  Completed   HPV VACCINES  Aged Out   COVID-19 Vaccine  Discontinued   Hepatitis C Screening  Discontinued   HIV Screening  Discontinued    Physical Exam: Vitals:   02/19/24 1430  BP: 114/80  Pulse: 70  Resp: (!) 21  Temp: 98.1 F (36.7 C)  SpO2: 98%  Weight: 230 lb 9.6 oz (104.6 kg)  Height: 6\' 1"  (1.854 m)   Body mass index is 30.42 kg/m. Physical Exam Constitutional:      General: He is not in acute distress.    Appearance: He is obese.  HENT:     Head: Normocephalic and atraumatic.     Mouth/Throat:     Mouth: Mucous membranes are moist.  Eyes:     Conjunctiva/sclera: Conjunctivae normal.  Cardiovascular:     Rate and Rhythm: Normal rate and regular rhythm.     Pulses: Normal pulses.     Heart sounds: Normal heart sounds.  Pulmonary:     Effort: Pulmonary effort is normal.     Breath sounds: Normal breath sounds.  Abdominal:     General: Bowel sounds are normal.     Palpations: Abdomen is soft.  Musculoskeletal:        General: No swelling. Normal range of motion.     Cervical back: Normal range of motion.  Skin:    General: Skin is warm and dry.   Neurological:     General: No focal deficit present.     Mental Status: He is alert and oriented to person, place, and time.  Psychiatric:        Mood and Affect: Mood normal.        Behavior: Behavior normal.        Thought Content: Thought content normal.        Judgment: Judgment normal.     Labs reviewed: Basic Metabolic Panel: Recent Labs    08/17/23 1137 02/22/24 0851  NA 142 140  K 4.4 4.4  CL 104 104  CO2 28 27  GLUCOSE 92 99  BUN 12 17  CREATININE 1.16 1.16  CALCIUM 9.6 9.6   Liver Function Tests: Recent Labs    08/17/23 1137 02/22/24 0851  AST 29 28  ALT 30 30  BILITOT 0.7 0.5  PROT 6.8 7.0   No results for input(s): "LIPASE", "AMYLASE" in the last 8760 hours. No results for input(s): "AMMONIA" in the last 8760 hours. CBC: Recent Labs    08/17/23 1137 02/22/24 0851  WBC 5.7 9.3  NEUTROABS 3,243 6,724  HGB 14.6 14.1  HCT 43.4 41.9  MCV 93.1 93.7  PLT 292 316   Lipid Panel: Recent Labs    02/22/24 0851  CHOL 131  HDL 50  LDLCALC 61  TRIG 116  CHOLHDL 2.6   Lab Results  Component Value Date   HGBA1C 5.4 02/22/2024    Procedures since last visit: No results found.  Assessment/Plan  1. Acute bronchitis, unspecified organism (Primary) -  Suspected viral infection with wheezing and inflammation. Discussed medication interactions, advised against Mucinex DM due to Wellbutrin interaction. - Prescribe prednisone 20 mg daily for 3 days, then 10 mg daily for 3 days. - Continue albuterol inhaler as needed. - Continue Advair 100/50 mcg, one puff twice a day. - Use Robitussin as needed for cough every 4 hours. - Recommend Mucinex (guaifenesin) regular, not DM, for 14  days. - Advise taking vitamin C 1000 mg daily for one week. - predniSONE (DELTASONE) 20 MG tablet; Take 1 tablet (20 mg total) by mouth daily with breakfast for 3 days, THEN 0.5 tablets (10 mg total) daily with breakfast for 3 days.  Dispense: 3 tablet; Refill: 0 - Ascorbic Acid  (VITAMIN C) 1000 MG tablet; Take 1 tablet (1,000 mg total) by mouth daily for 7 days. - guaiFENesin (MUCINEX) 600 MG 12 hr tablet; Take 1 tablet (600 mg total) by mouth 2 (two) times daily for 14 days.  2. Primary hypertension -  Blood pressure well-controlled on amlodipine and losartan.  3. Anxiety with depression -  On bupropion for depression and alprazolam as needed for anxiety. Advised against mixing alprazolam with codeine.     General Health Maintenance Former smoker, semi-retired. Advised caution with medication interactions, particularly with Wellbutrin and Xanax. - Return if symptoms do not resolve.     Labs/tests ordered:     Return if symptoms worsen or fail to improve.  Kenard Gower, NP

## 2024-02-19 NOTE — Telephone Encounter (Signed)
 Will need visit prior to anything being called in.

## 2024-02-19 NOTE — Telephone Encounter (Signed)
 Chief Complaint: Coughing with green phlegm Symptoms: see above Frequency: intermittent for 3 weeks Pertinent Negatives: Patient denies fever, shortness of breath Disposition: [] ED /[] Urgent Care (no appt availability in office) / [x] Appointment(In office/virtual)/ []  [place] [facility]/ [] Home Care/ [] Refused Recommended Disposition /[] [place] [facility]/ []  Follow-up with PCP Additional Notes: Patient called in stating he is having a cough for roughly three weeks, and it has worsened over the last week. Patient states he is coughing up green phlegm. Patient denies fever or shortness of breath. Patient states his wife stated she heard him wheezing. Patient refused appt for today and states he will keep the original appt he scheduled himself for Monday, but would like to know if PCP can call him in a steroid for over the weekend. If so, [facility] Patient advised to go to Urgent Care over the weekend if symptoms worsen.   Copied from CRM [id number]. Topic: Clinical - Medication Question >> Feb 19, 2024 11:28 AM Irine Seal wrote: Reason for CRM: Patient was triaged on 02/17/24 for a dry cough, which has now become productive with green sputum. He experiences recurring bronchitis around this time each year. He is scheduled for an office visit with his PCP on 02/22/24 but is requesting a [facility] [place] [facility]. Reason for Disposition  Cough has been present for > 3 weeks  Answer Assessment - Initial Assessment Questions 1. ONSET: "When did the cough begin?"      Worsened over last week 2. SEVERITY: "How bad is the cough today?"      Has worsened and is coughing up green sputum 3. SPUTUM: "Describe the color of your sputum" (none, dry cough; clear, white, yellow, green)     green 4. HEMOPTYSIS: "Are you coughing up any blood?" If so ask: "How much?" (flecks, streaks, tablespoons, etc.)      No 5. DIFFICULTY BREATHING: "Are you having difficulty breathing?" If Yes, ask: "How bad is it?" (e.g., mild, moderate, severe)    - MILD: No SOB at rest, mild SOB with walking, speaks normally in sentences, can lie down, no retractions, pulse < 100.    - MODERATE: SOB at rest, SOB with minimal exertion and prefers to sit, cannot lie down flat, speaks in phrases, mild retractions, audible wheezing, pulse 100-120.    - SEVERE: Very SOB at rest, speaks in single words, struggling to breathe, sitting hunched forward, retractions, pulse > 120      No 6. FEVER: "Do you have a fever?" If Yes, ask: "What is your temperature, how was it measured, and when did it start?"     No 7. CARDIAC HISTORY: "Do you have any history of heart disease?" (e.g., heart attack, congestive heart failure)      No 8. LUNG HISTORY: "Do you have any history of lung disease?"  (e.g., pulmonary embolus, asthma, emphysema)     Asthma 9. PE RISK FACTORS: "Do you have a history of blood clots?" (or: recent major surgery, recent prolonged travel, bedridden)     No 10. OTHER SYMPTOMS: "Do you have any other symptoms?" (e.g., runny nose, wheezing, chest pain)       Wheezing  Protocols used: Cough - Acute Productive-A-AH

## 2024-02-19 NOTE — Telephone Encounter (Signed)
Message routed to PCP Eubanks, Jessica K, NP  

## 2024-02-19 NOTE — Telephone Encounter (Signed)
 Called patient and he scheduled for visit today in office with available provider Ella Bodo, NP. Message sent to her as FYI and Engineer, site assigned to her.

## 2024-02-22 ENCOUNTER — Ambulatory Visit (INDEPENDENT_AMBULATORY_CARE_PROVIDER_SITE_OTHER): Admitting: Nurse Practitioner

## 2024-02-22 VITALS — BP 132/80 | HR 84 | Temp 97.3°F | Ht 73.0 in | Wt 230.0 lb

## 2024-02-22 DIAGNOSIS — Z23 Encounter for immunization: Secondary | ICD-10-CM | POA: Diagnosis not present

## 2024-02-22 DIAGNOSIS — M109 Gout, unspecified: Secondary | ICD-10-CM | POA: Diagnosis not present

## 2024-02-22 DIAGNOSIS — I10 Essential (primary) hypertension: Secondary | ICD-10-CM

## 2024-02-22 DIAGNOSIS — E782 Mixed hyperlipidemia: Secondary | ICD-10-CM

## 2024-02-22 DIAGNOSIS — K219 Gastro-esophageal reflux disease without esophagitis: Secondary | ICD-10-CM

## 2024-02-22 DIAGNOSIS — M47812 Spondylosis without myelopathy or radiculopathy, cervical region: Secondary | ICD-10-CM | POA: Diagnosis not present

## 2024-02-22 DIAGNOSIS — J411 Mucopurulent chronic bronchitis: Secondary | ICD-10-CM

## 2024-02-22 DIAGNOSIS — R7303 Prediabetes: Secondary | ICD-10-CM | POA: Diagnosis not present

## 2024-02-22 DIAGNOSIS — F330 Major depressive disorder, recurrent, mild: Secondary | ICD-10-CM

## 2024-02-22 NOTE — Telephone Encounter (Signed)
Message routed to PCP Eubanks, Jessica K, NP  

## 2024-02-22 NOTE — Progress Notes (Signed)
 Careteam: Patient Care Team: [name], NP as PCP - General (Nurse Practitioner) [name], [name], NP (Inactive) as Nurse Practitioner (Vascular Surgery) [name], MD as Consulting Physician (General Surgery) [name], MD as Consulting Physician (Pulmonary Disease) [name], MD as Consulting Physician (Cardiology) [name], PA-C as Physician Assistant (Dermatology) Napoleon Form, MD as Consulting Physician (Gastroenterology)   PLACE OF SERVICE: [facility]  Advanced Directive information     Allergies  Allergen Reactions   Singulair [Montelukast]      Chief Complaint  Patient presents with   Medical Management of Chronic Issues    Routine visit. Discussed need for pneumonia (patient currently with URI symptoms) and td/tdap. Ongoing cough and congestion (yellow). Discuss remaining prednisone doses.      HPI: Patient is a 68 y.o. [name] presents for medical management of chronic issues.   Was recently seen in the office for acute bronchitis and received steroids. Reports he finally slept better last night. Some nasal congestion, cough not productive.  No issues with GERD.   No gout flare ups.   Briefly went off Wellbutrin but is back on it, reports mood is stable.  Compliant with medications.  Generalized musculoskeletal pain, was previously prescribed diclofenac by Dr. Conchita Paris. Reports he is taking twice a day daily. Tried not taking it for a while but had ongoing pain and restarted.   Review of Systems:  Review of Systems  Constitutional:  Negative for chills, fever and malaise/fatigue.  HENT:  Positive for congestion (nasal).   Respiratory:  Negative for cough and shortness of breath.   Cardiovascular:  Negative for chest pain, palpitations and leg swelling.  Gastrointestinal:  Negative for blood in stool, constipation, diarrhea, nausea and vomiting.  Genitourinary:  Negative for dysuria, frequency and urgency.   Musculoskeletal:  Positive for joint pain and myalgias.  Neurological:  Negative for dizziness, weakness and headaches.  Psychiatric/Behavioral:  Negative for depression. The patient is not nervous/anxious and does not have insomnia.     Past Medical History:  Diagnosis Date   Abdominal aortic aneurysm (AAA) without rupture (HCC) 11/01/2013   Abdominal aortic aneurysm [facility])    sees Dr. Jacinto Halim for cardiac f/u, [phone]   Arthritis    lower back   Asthma    followed by Dr. Samara Snide   Chronic airway obstruction, not elsewhere classified    Dyspnea    normal PFT April 2012   External hemorrhoids without mention of complication    GERD (gastroesophageal reflux disease)    Gout    H/O hiatal hernia    Hemorrhoids    HTN (hypertension)    hx of sees Dr. Alonna Minium   Hyperlipidemia    Insomnia, unspecified    Other abnormal blood chemistry    Other dyspnea and respiratory abnormality    Other malaise and fatigue    Other specified erythematous condition(695.89)    Other testicular dysfunction    Primary hypertension 01/20/2011   Prostatitis, unspecified    Rash 2011   left chest, biopsy 2012 Dr. Charlton Haws, results pending   Skin cancer    Squamous cell carcinoma of skin 11/03/2014   right shoulder cx3 64fu   Tobacco abuse     Past Surgical History:  Procedure Laterality Date   ABDOMINAL AORTIC ENDOVASCULAR STENT GRAFT  09/22/2012   Procedure: ABDOMINAL AORTIC ENDOVASCULAR STENT GRAFT;  Surgeon: Pryor Ochoa, MD;  Location: [facility] OR;  Service: Vascular;  Laterality: N/A;  GORE; ultrasound guided.   APPENDECTOMY  COLONOSCOPY N/A 09/2023   EVALUATION UNDER ANESTHESIA WITH HEMORRHOIDECTOMY N/A 12/31/2016   Procedure: EXAM UNDER ANESTHESIA WITH HEMORRHOIDECTOMY;  Surgeon: [name], MD;  Location: [facility];  Service: General;  Laterality: N/A;   SKIN CANCER EXCISION     TOE SURGERY  01/2012   joint of left great toe   TONSILLECTOMY      TONSILLECTOMY     TYMPANOPLASTY     WRIST SURGERY     left     Social History:   reports that he quit smoking about 3 years ago. His smoking use included cigarettes. He started smoking about 41 years ago. He has a 9.5 pack-year smoking history. He has never been exposed to tobacco smoke. He quit smokeless tobacco use about 12 years ago.  His smokeless tobacco use included snuff. He reports current alcohol use. He reports that he does not use drugs.  Family History  Problem Relation Age of Onset   Cancer Mother        Breast   Heart attack Father 20   COPD Father    Heart disease Father    Osler-Weber-Rendu syndrome Brother    Asthma Daughter    Lupus Daughter    Diabetes Other        Grandson    Colon cancer Neg Hx    Pancreatic cancer Neg Hx    Esophageal cancer Neg Hx    Stomach cancer Neg Hx    Rectal cancer Neg Hx      Medications:  Patient's Medications  New Prescriptions   No medications on file  Previous Medications   ACETAMINOPHEN (TYLENOL) 500 MG TABLET    Take 2 tablets (1,000 mg total) by mouth every 6 (six) hours as needed.   ALBUTEROL (VENTOLIN HFA) 108 (90 BASE) MCG/ACT INHALER    Inhale 2 puffs into the lungs every 6 (six) hours as needed for cough and wheezing.   ALLOPURINOL (ZYLOPRIM) 100 MG TABLET    Take 1 tablet (100 mg total) by mouth daily.   ALPRAZOLAM (XANAX) 0.25 MG TABLET    Take 1 tablet (0.25 mg) by mouth daily as needed for anxiety (to last 30 days).   AMLODIPINE (NORVASC) 10 MG TABLET    Take 1 tablet (10 mg total) by mouth daily.   ASCORBIC ACID (VITAMIN C) 1000 MG TABLET    Take 1 tablet (1,000 mg total) by mouth daily for 7 days.   ASPIRIN EC 81 MG TABLET    Take 1 tablet (81 mg total) by mouth daily. Swallow whole.   BUPROPION (WELLBUTRIN XL) 150 MG 24 HR TABLET    Take 1 tablet (150 mg total) by mouth daily.   CLINDAMYCIN (CLEOCIN-T) 1 % LOTION    Apply topically daily.   COLCHICINE 0.6 MG TABLET    Take 0.6 mg by mouth as needed. For gout  flare up   DICLOFENAC (CATAFLAM) 50 MG TABLET    Take 1 tablet (50 mg total) by mouth 2 (two) times daily.   FLUTICASONE-SALMETEROL (ADVAIR) 100-50 MCG/ACT AEPB    Inhale 1 puff into the lungs 2 (two) times daily.   GUAIFENESIN (MUCINEX) 600 MG 12 HR TABLET    Take 1 tablet (600 mg total) by mouth 2 (two) times daily for 14 days.   LOSARTAN (COZAAR) 100 MG TABLET    Take 1 tablet (100 mg total) by mouth daily.   PANTOPRAZOLE (PROTONIX) 40 MG TABLET    Take 1 tablet (40 mg total) by  mouth daily.   PREDNISONE (DELTASONE) 20 MG TABLET    Take 1 tablet (20 mg total) by mouth daily with breakfast for 3 days, THEN 0.5 tablets (10 mg total) daily with breakfast for 3 days.   SILDENAFIL (VIAGRA) 50 MG TABLET    Take 1 tablet (50 mg) by mouth as needed for erectile dysfunction.   SIMVASTATIN (ZOCOR) 20 MG TABLET    Take 1 tablet (20 mg total) by mouth at bedtime.   TRIAMCINOLONE OINTMENT (KENALOG) 0.1 %    Apply 1 Application topically 2 (two) times daily. Use for 2 weeks, then stop for 2 weeks. Repeat if needed.  Modified Medications   No medications on file  Discontinued Medications   No medications on file     Physical Exam:  Vitals:   02/19/24 1555  BP: 132/80  Pulse: 84  Temp: (!) 97.3 F (36.3 C)  SpO2: 96%  Weight: 230 lb (104.3 kg)  Height: 6\' 1"  (1.854 m)   Body mass index is 30.34 kg/m.  Wt Readings from Last 3 Encounters:  02/19/24 230 lb (104.3 kg)  02/19/24 230 lb 9.6 oz (104.6 kg)  10/20/23 231 lb (104.8 kg)     Physical Exam Constitutional:      General: He is not in acute distress.    Appearance: Normal appearance. He is not ill-appearing or toxic-appearing.  Cardiovascular:     Rate and Rhythm: Normal rate and regular rhythm.     Pulses: Normal pulses.     Heart sounds: Normal heart sounds.  Pulmonary:     Effort: Pulmonary effort is normal. No respiratory distress.     Breath sounds: Normal breath sounds. No wheezing.  Abdominal:     General: Bowel sounds are  normal.     Palpations: Abdomen is soft.  Skin:    General: Skin is warm and dry.  Neurological:     General: No focal deficit present.     Mental Status: He is alert and oriented to person, place, and time.  Psychiatric:        Mood and Affect: Mood normal.        Behavior: Behavior normal.        Thought Content: Thought content normal.     Labs reviewed: Basic Metabolic Panel:  Recent Labs    08/17/23 1137  NA 142  K 4.4  CL 104  CO2 28  GLUCOSE 92  BUN 12  CREATININE 1.16  CALCIUM 9.6   Liver Function Tests:  Recent Labs    08/17/23 1137  AST 29  ALT 30  BILITOT 0.7  PROT 6.8   No results for input(s): "LIPASE", "AMYLASE" in the last 8760 hours. No results for input(s): "AMMONIA" in the last 8760 hours. CBC:  Recent Labs    08/17/23 1137  WBC 5.7  NEUTROABS 3,243  HGB 14.6  HCT 43.4  MCV 93.1  PLT 292   Lipid Panel: No results for input(s): "CHOL", "HDL", "LDLCALC", "TRIG", "CHOLHDL", "LDLDIRECT" in the last 8760 hours. TSH: No results for input(s): "TSH" in the last 8760 hours. A1C:  Lab Results  Component Value Date   HGBA1C 5.6 08/17/2023    Assessment/Plan   1. Primary hypertension (Primary) -Controlled, at goal, <140/90 -Encouraged dietary modifications/DASH diet and physical activity as tolerated -Continue amlodipine and losartan - COMPLETE METABOLIC PANEL WITHOUT GFR - CBC with Differential/Platelet  2. Gout, unspecified cause, unspecified chronicity, unspecified site -Continue colchicine for flare-ups - Uric Acid  3. Mixed hyperlipidemia -Encouraged  dietary modifications, decrease processed/high fat foods and increase physical activity as tolerated -Continue simvastatin - Lipid panel - COMPLETE METABOLIC PANEL WITHOUT GFR - CBC with Differential/Platelet  4. Gastroesophageal reflux disease, unspecified whether esophagitis present -Symptoms controlled -Continue pantoprazole  5. Mild episode of recurrent major depressive disorder  (HCC) -Stable -Continue Wellbutrin XL  6. Prediabetes - Hemoglobin A1c today Continue dietary modification  - Pneumococcal conjugate vaccine 20-valent (Prevnar 20)  7. Chronic bronchitis with productive mucopurulent cough (HCC) -Recent flare-up, continue/finish steroids -Lungs clear, no chest congestion today -Notify if worsening symptoms  8. OA of cervical spine -Patient has been taking diclofenac  twice a day everyday -Encouraged use only as needed;educated on side effects of daily NSAIDs, monitor for GI upset, take medication with food, avoid concurrent NSAID use -Recommended using tylenol 1000mg  every 8 hours as needed for pain instead  9. Immunization due - Pneumococcal conjugate vaccine 20-valent (Prevnar 20)   Return in about 6 months (around 08/23/2024) for routine follow up, labs at time of visit.  Rollen Sox, Haroldine Laws MSN-FNP Student -I personally was present during the history, physical exam and medical decision-making activities of this service and have verified that the service and findings are accurately documented in the student's note Meleah Demeyer K. Biagio Borg [facility] [phone]

## 2024-02-22 NOTE — Patient Instructions (Addendum)
 To use nettipot daily  Plain saline as needed Do not excessively blow nose  Use diclofenac just as needed- and sparingly Tylenol 1000 mg by mouth every 8 hours as needed - use first

## 2024-02-23 ENCOUNTER — Encounter: Payer: Self-pay | Admitting: Nurse Practitioner

## 2024-02-23 LAB — CBC WITH DIFFERENTIAL/PLATELET
Absolute Lymphocytes: 1423 {cells}/uL (ref 850–3900)
Absolute Monocytes: 986 {cells}/uL — ABNORMAL HIGH (ref 200–950)
Basophils Absolute: 56 {cells}/uL (ref 0–200)
Basophils Relative: 0.6 %
Eosinophils Absolute: 112 {cells}/uL (ref 15–500)
Eosinophils Relative: 1.2 %
HCT: 41.9 % (ref 38.5–50.0)
Hemoglobin: 14.1 g/dL (ref 13.2–17.1)
MCH: 31.5 pg (ref 27.0–33.0)
MCHC: 33.7 g/dL (ref 32.0–36.0)
MCV: 93.7 fL (ref 80.0–100.0)
MPV: 9.5 fL (ref 7.5–12.5)
Monocytes Relative: 10.6 %
Neutro Abs: 6724 {cells}/uL (ref 1500–7800)
Neutrophils Relative %: 72.3 %
Platelets: 316 10*3/uL (ref 140–400)
RBC: 4.47 10*6/uL (ref 4.20–5.80)
RDW: 12.5 % (ref 11.0–15.0)
Total Lymphocyte: 15.3 %
WBC: 9.3 10*3/uL (ref 3.8–10.8)

## 2024-02-23 LAB — LIPID PANEL
Cholesterol: 131 mg/dL (ref <200)
HDL: 50 mg/dL (ref >=40)
LDL Cholesterol (Calc): 61 mg/dL
Non-HDL Cholesterol (Calc): 81 mg/dL (ref <130)
Total CHOL/HDL Ratio: 2.6 (calc) (ref <5.0)
Triglycerides: 116 mg/dL (ref <150)

## 2024-02-23 LAB — COMPLETE METABOLIC PANEL WITHOUT GFR
AG Ratio: 1.6 (calc) (ref 1.0–2.5)
ALT: 30 U/L (ref 9–46)
AST: 28 U/L (ref 10–35)
Albumin: 4.3 g/dL (ref 3.6–5.1)
Alkaline phosphatase (APISO): 85 U/L (ref 35–144)
BUN: 17 mg/dL (ref 7–25)
CO2: 27 mmol/L (ref 20–32)
Calcium: 9.6 mg/dL (ref 8.6–10.3)
Chloride: 104 mmol/L (ref 98–110)
Creat: 1.16 mg/dL (ref 0.70–1.35)
Globulin: 2.7 g/dL (ref 1.9–3.7)
Glucose, Bld: 99 mg/dL (ref 65–139)
Potassium: 4.4 mmol/L (ref 3.5–5.3)
Sodium: 140 mmol/L (ref 135–146)
Total Bilirubin: 0.5 mg/dL (ref 0.2–1.2)
Total Protein: 7 g/dL (ref 6.1–8.1)

## 2024-02-23 LAB — URIC ACID: Uric Acid, Serum: 5.7 mg/dL (ref 4.0–8.0)

## 2024-02-23 LAB — HEMOGLOBIN A1C
Hgb A1c MFr Bld: 5.4 %{Hb} (ref <5.7)
Mean Plasma Glucose: 108 mg/dL
eAG (mmol/L): 6 mmol/L

## 2024-02-24 ENCOUNTER — Other Ambulatory Visit: Payer: Self-pay | Admitting: Nurse Practitioner

## 2024-02-24 ENCOUNTER — Other Ambulatory Visit (HOSPITAL_COMMUNITY): Payer: Self-pay

## 2024-02-24 ENCOUNTER — Other Ambulatory Visit: Payer: Self-pay

## 2024-02-24 DIAGNOSIS — F330 Major depressive disorder, recurrent, mild: Secondary | ICD-10-CM

## 2024-02-24 MED ORDER — BENZONATATE 100 MG PO CAPS: 100.0000 mg | ORAL_CAPSULE | Freq: Three times a day (TID) | ORAL | 0 refills | Status: DC | PRN

## 2024-02-24 MED ORDER — BUPROPION HCL ER (XL) 150 MG PO TB24
150.0000 mg | ORAL_TABLET | Freq: Every day | ORAL | 1 refills | Status: DC
  Filled 2024-02-24: qty 90, 90d supply, fill #0
  Filled 2024-05-17: qty 90, 90d supply, fill #1

## 2024-02-24 MED ORDER — BENZONATATE 100 MG PO CAPS
100.0000 mg | ORAL_CAPSULE | Freq: Three times a day (TID) | ORAL | 0 refills | Status: DC | PRN
  Filled 2024-02-24: qty 30, 10d supply, fill #0

## 2024-02-24 NOTE — Progress Notes (Unsigned)
 Due to ongoing cough Rx sent for tessalon to pharmacy Does not need to extend prednisone at this time Also can use delsym 10 cc twice daily to help with cough in addition to tessalon. To take Loratadine or cetrizine (generic for Claritin or zyrtec) 10 mg by mouth daily for allergies as well.

## 2024-02-25 ENCOUNTER — Telehealth: Payer: Self-pay | Admitting: *Deleted

## 2024-02-25 ENCOUNTER — Ambulatory Visit (INDEPENDENT_AMBULATORY_CARE_PROVIDER_SITE_OTHER): Admitting: Nurse Practitioner

## 2024-02-25 DIAGNOSIS — Z0000 Encounter for general adult medical examination without abnormal findings: Secondary | ICD-10-CM | POA: Diagnosis not present

## 2024-02-25 NOTE — Progress Notes (Signed)
 Subjective:    Aaron [name] is a 68 y.o. male who presents for a Welcome to Medicare exam.   Cardiac Risk Factors include: male gender     Objective:    There were no vitals filed for this visit. There is no height or weight on file to calculate BMI.  Medications Outpatient Encounter Medications as of 02/25/2024  Medication Sig   acetaminophen (TYLENOL) 500 MG tablet Take 2 tablets (1,000 mg total) by mouth every 6 (six) hours as needed.   albuterol (VENTOLIN HFA) 108 (90 Base) MCG/ACT inhaler Inhale 2 puffs into the lungs every 6 (six) hours as needed for cough and wheezing.   allopurinol (ZYLOPRIM) 100 MG tablet Take 1 tablet (100 mg total) by mouth daily.   ALPRAZolam (XANAX) 0.25 MG tablet Take 1 tablet (0.25 mg) by mouth daily as needed for anxiety (to last 30 days).   amLODipine (NORVASC) 10 MG tablet Take 1 tablet (10 mg total) by mouth daily.   Ascorbic Acid (VITAMIN C) 1000 MG tablet Take 1 tablet (1,000 mg total) by mouth daily for 7 days.   aspirin EC 81 MG tablet Take 1 tablet (81 mg total) by mouth daily. Swallow whole.   benzonatate (TESSALON) 100 MG capsule Take 1 capsule (100 mg total) by mouth 3 (three) times daily as needed for cough.   buPROPion (WELLBUTRIN XL) 150 MG 24 hr tablet Take 1 tablet (150 mg total) by mouth daily.   clindamycin (CLEOCIN-T) 1 % lotion Apply topically daily.   colchicine 0.6 MG tablet Take 0.6 mg by mouth as needed. For gout flare up   diclofenac (CATAFLAM) 50 MG tablet Take 1 tablet (50 mg total) by mouth 2 (two) times daily.   fluticasone-salmeterol (ADVAIR) 100-50 MCG/ACT AEPB Inhale 1 puff into the lungs 2 (two) times daily.   guaiFENesin (MUCINEX) 600 MG 12 hr tablet Take 1 tablet (600 mg total) by mouth 2 (two) times daily for 14 days.   losartan (COZAAR) 100 MG tablet Take 1 tablet (100 mg total) by mouth daily.   pantoprazole (PROTONIX) 40 MG tablet Take 1 tablet (40 mg total) by mouth daily.   predniSONE (DELTASONE) 20 MG  tablet Take 1 tablet (20 mg total) by mouth daily with breakfast for 3 days, THEN 0.5 tablets (10 mg total) daily with breakfast for 3 days.   sildenafil (VIAGRA) 50 MG tablet Take 1 tablet (50 mg) by mouth as needed for erectile dysfunction.   simvastatin (ZOCOR) 20 MG tablet Take 1 tablet (20 mg total) by mouth at bedtime.   triamcinolone ointment (KENALOG) 0.1 % Apply 1 Application topically 2 (two) times daily. Use for 2 weeks, then stop for 2 weeks. Repeat if needed.   Facility-Administered Encounter Medications as of 02/25/2024  Medication   0.9 %  sodium chloride infusion     History: Past Medical History:  Diagnosis Date   Abdominal aortic aneurysm (AAA) without rupture (HCC) 11/01/2013   Abdominal aortic aneurysm [facility])    sees Dr. Jacinto Halim for cardiac f/u, [phone]   Arthritis    lower back   Asthma    followed by Dr. Samara Snide   Chronic airway obstruction, not elsewhere classified    Dyspnea    normal PFT April 2012   External hemorrhoids without mention of complication    GERD (gastroesophageal reflux disease)    Gout    H/O hiatal hernia    Hemorrhoids    HTN (hypertension)    hx of sees Dr. Alonna Minium  Hyperlipidemia    Insomnia, unspecified    Other abnormal blood chemistry    Other dyspnea and respiratory abnormality    Other malaise and fatigue    Other specified erythematous condition(695.89)    Other testicular dysfunction    Primary hypertension 01/20/2011   Prostatitis, unspecified    Rash 2011   left chest, biopsy 2012 Dr. Charlton Haws, results pending   Skin cancer    Squamous cell carcinoma of skin 11/03/2014   right shoulder cx3 83fu   Tobacco abuse    Past Surgical History:  Procedure Laterality Date   ABDOMINAL AORTIC ENDOVASCULAR STENT GRAFT  09/22/2012   Procedure: ABDOMINAL AORTIC ENDOVASCULAR STENT GRAFT;  Surgeon: Pryor Ochoa, MD;  Location: [facility] OR;  Service: Vascular;  Laterality: N/A;  GORE; ultrasound guided.   APPENDECTOMY      COLONOSCOPY N/A 09/2023   EVALUATION UNDER ANESTHESIA WITH HEMORRHOIDECTOMY N/A 12/31/2016   Procedure: EXAM UNDER ANESTHESIA WITH HEMORRHOIDECTOMY;  Surgeon: [name], MD;  Location: [facility];  Service: General;  Laterality: N/A;   SKIN CANCER EXCISION     TOE SURGERY  01/2012   joint of left great toe   TONSILLECTOMY     TONSILLECTOMY     TYMPANOPLASTY     WRIST SURGERY     left    Family History  Problem Relation Age of Onset   Cancer Mother        Breast   Heart attack Father 41   COPD Father    Heart disease Father    Osler-Weber-Rendu syndrome Brother    Asthma Daughter    Lupus Daughter    Diabetes Other        Grandson    Colon cancer Neg Hx    Pancreatic cancer Neg Hx    Esophageal cancer Neg Hx    Stomach cancer Neg Hx    Rectal cancer Neg Hx    Social History   Occupational History   Occupation: Scientist, research (medical): [employer]  Tobacco Use   Smoking status: Former    Current packs/day: 0.00    Average packs/day: 0.3 packs/day for 38.0 years (9.5 ttl pk-yrs)    Types: Cigarettes    Start date: 10/31/1982    Quit date: 10/31/2020    Years since quitting: 3.3    Passive exposure: Never   Smokeless tobacco: Former    Types: Snuff    Quit date: 11/25/2011  Vaping Use   Vaping status: Never Used  Substance and Sexual Activity   Alcohol use: Yes    Alcohol/week: 0.0 standard drinks of alcohol    Comment: occ   Drug use: No   Sexual activity: Yes    Tobacco Counseling Counseling given: Not Answered   Immunizations and Health Maintenance Immunization History  Administered Date(s) Administered   Influenza Split 09/20/2012   Influenza Whole 08/30/2019   Influenza, Seasonal, Injecte, Preservative Fre 08/17/2023   Influenza,inj,Quad PF,6+ Mos 09/29/2013, 08/08/2014, 08/09/2015, 07/17/2016, 10/14/2017, 01/14/2019, 09/21/2020, 09/30/2021, 01/19/2023   Influenza-Unspecified 08/24/2017   Janssen (J&J) SARS-COV-2 Vaccination 08/04/2020    PNEUMOCOCCAL CONJUGATE-20 02/22/2024   Pneumococcal Conjugate-13 12/12/2014, 12/12/2014   Pneumococcal Polysaccharide-23 06/30/2011   Tdap 12/06/2013   Zoster Recombinant(Shingrix) 06/13/2020, 09/07/2020   Health Maintenance Due  Topic Date Due   DTaP/Tdap/Td (2 - Td or Tdap) 12/07/2023    Activities of Daily Living    02/25/2024    9:15 AM  In your present state of health, do you have any difficulty  performing the following activities:  Hearing? 0  Vision? 0  Difficulty concentrating or making decisions? 0  Walking or climbing stairs? 0  Dressing or bathing? 0  Doing errands, shopping? 0  Preparing Food and eating ? N  Using the Toilet? N  In the past six months, have you accidently leaked urine? N  Do you have problems with loss of bowel control? N  Managing your Medications? N  Managing your Finances? N  Housekeeping or managing your Housekeeping? N    Physical Exam   Physical Exam (optional), or other factors deemed appropriate based on the beneficiary's medical and social history and current clinical standards.   Advanced Directives: Does Patient Have a Medical Advance Directive?: No Would patient like information on creating a medical advance directive?: No - Patient declined     Assessment:    This is a routine wellness  examination for this patient .   Vision/Hearing screen Vision Screening - Comments:: [facility] in [place] Last Exam: 10/2023   Goals   None      Depression Screen    02/25/2024    9:14 AM 02/19/2024    2:40 PM 08/17/2023   11:00 AM 05/06/2023    9:49 AM  PHQ 2/9 Scores  PHQ - 2 Score 0 0 0 0     Fall Risk    02/25/2024    9:14 AM  Fall Risk   Falls in the past year? 0  Number falls in past yr: 0  Injury with Fall? 0  Risk for fall due to : No Fall Risks  Follow up Falls evaluation completed    Cognitive Function        02/25/2024    9:18 AM  6CIT Screen  What Year? 0 points  What month? 0 points  What time? 0  points  Count back from 20 0 points  Months in reverse 0 points  Repeat phrase 0 points  Total Score 0 points    Patient Care Team: [name], NP as PCP - General (Nurse Practitioner) [name], [name], NP (Inactive) as Nurse Practitioner (Vascular Surgery) [name], MD as Consulting Physician (General Surgery) [name], MD as Consulting Physician (Pulmonary Disease) [name], MD as Consulting Physician (Cardiology) [name], PA-C as Physician Assistant (Dermatology) Napoleon Form, MD as Consulting Physician (Gastroenterology)     Plan:     I have personally reviewed and noted the following in the patient's chart:   Medical and social history Use of alcohol, tobacco or illicit drugs  Current medications and supplements Functional ability and status Nutritional status Physical activity Advanced directives List of other physicians Hospitalizations, surgeries, and ER visits in previous 12 months Vitals Screenings to include cognitive, depression, and falls Referrals and appointments  In addition, I have reviewed and discussed with patient certain preventive protocols, quality metrics, and best practice recommendations. A written personalized care plan for preventive services as well as general preventive health recommendations were provided to patient.     [name], NP 02/25/2024

## 2024-02-25 NOTE — Telephone Encounter (Signed)
 Aaron [name], Aaron [name] are scheduled for a virtual visit with your provider today.    Just as we do with appointments in the office, we must obtain your consent to participate.  Your consent will be active for this visit and any virtual visit you [name] have with one of our providers in the next 365 days.    If you have a MyChart account, I can also send a copy of this consent to you electronically.  All virtual visits are billed to your insurance company just like a traditional visit in the office.  As this is a virtual visit, video technology does not allow for your provider to perform a traditional examination.  This [name] limit your provider's ability to fully assess your condition.  If your provider identifies any concerns that need to be evaluated in person or the need to arrange testing such as labs, EKG, etc, we will make arrangements to do so.    Although advances in technology are sophisticated, we cannot ensure that it will always work on either your end or our end.  If the connection with a video visit is poor, we [name] have to switch to a telephone visit.  With either a video or telephone visit, we are not always able to ensure that we have a secure connection.   I need to obtain your verbal consent now.   Are you willing to proceed with your visit today?   Aaron [name] has provided verbal consent on 02/25/2024 for a virtual visit (video or telephone).   [name], [place] 02/25/2024  10:14 AM

## 2024-02-25 NOTE — Progress Notes (Signed)
  This service is provided via telemedicine  No vital signs collected/recorded due to the encounter was a telemedicine visit.   Location of patient (ex: home, work):  Home  Patient consents to a telephone visit:  Yes  Location of the provider (ex: office, home):  Office [place].   Name of any referring provider:  na  Names of all persons participating in the telemedicine service and their role in the encounter:  Donneta Romberg, Patient, Nelda Severe, CMA, Abbey Chatters, NP  Time spent on call:  7:11

## 2024-02-25 NOTE — Patient Instructions (Signed)
  Mr. Aaron [name] , Thank you for taking time to come for your Medicare Wellness Visit. I appreciate your ongoing commitment to your health goals. Please review the following plan we discussed and let me know if I can assist you in the future.   To get TDAP at local pharmacy   This is a list of the screening recommended for you and due dates:  Health Maintenance  Topic Date Due   DTaP/Tdap/Td vaccine (2 - Td or Tdap) 12/07/2023   Flu Shot  06/24/2024   Medicare Annual Wellness Visit  02/24/2025   Colon Cancer Screening  10/11/2028   Pneumonia Vaccine  Completed   Zoster (Shingles) Vaccine  Completed   HPV Vaccine  Aged Out   COVID-19 Vaccine  Discontinued   Hepatitis C Screening  Discontinued   HIV Screening  Discontinued

## 2024-03-04 ENCOUNTER — Encounter: Payer: Self-pay | Admitting: Emergency Medicine

## 2024-03-04 ENCOUNTER — Ambulatory Visit
Admission: EM | Admit: 2024-03-04 | Discharge: 2024-03-04 | Disposition: A | Discharge status: Home / Self Care | Attending: Nurse Practitioner | Admitting: Nurse Practitioner

## 2024-03-04 ENCOUNTER — Ambulatory Visit

## 2024-03-04 DIAGNOSIS — Z23 Encounter for immunization: Secondary | ICD-10-CM

## 2024-03-04 DIAGNOSIS — S61211A Laceration without foreign body of left index finger without damage to nail, initial encounter: Secondary | ICD-10-CM

## 2024-03-04 MED ORDER — TETANUS-DIPHTH-ACELL PERTUSSIS 5-2.5-18.5 LF-MCG/0.5 IM SUSY
0.5000 mL | PREFILLED_SYRINGE | Freq: Once | INTRAMUSCULAR | Status: AC
  Administered 2024-03-04: 0.5 mL via INTRAMUSCULAR

## 2024-03-04 NOTE — Discharge Instructions (Addendum)
 We put 3 stitches in the cut on your finger today and updated your Tdap.  Please leave the dressing on until tomorrow morning.  Wear the finger splint to protect the area from hyperflexion.  You can take breaks from it occasionally.  Apply a thin layer of antibiotic ointment to the area twice daily.  Return here in 10-14 days to have the stitches removed.  Follow up with [place] sooner if signs or symptoms of infection develop.

## 2024-03-04 NOTE — ED Triage Notes (Signed)
 Cut left index finger today on a broken piece of pottery.  Unsure of when last tetanus shot was.

## 2024-03-04 NOTE — ED Provider Notes (Signed)
 [facility]    CSN: [id number] Arrival date & time: 03/04/24  1323      History   Chief Complaint No chief complaint on file.   HPI Aaron [name] is a 68 y.o. male.   Patient presents today with left index finger laceration that he sustained about 1 hour ago while picking up a broken piece of pottery.  He denies decreased range of motion of the finger or numbness or tingling in the tip of the finger.  He put Bleedstop on the wound which helped stop the bleeding.    Past Medical History:  Diagnosis Date   Abdominal aortic aneurysm (AAA) without rupture (HCC) 11/01/2013   Abdominal aortic aneurysm [facility])    sees Dr. Jacinto Halim for cardiac f/u, [phone]   Arthritis    lower back   Asthma    followed by Dr. Samara Snide   Chronic airway obstruction, not elsewhere classified    Dyspnea    normal PFT April 2012   External hemorrhoids without mention of complication    GERD (gastroesophageal reflux disease)    Gout    H/O hiatal hernia    Hemorrhoids    HTN (hypertension)    hx of sees Dr. Alonna Minium   Hyperlipidemia    Insomnia, unspecified    Other abnormal blood chemistry    Other dyspnea and respiratory abnormality    Other malaise and fatigue    Other specified erythematous condition(695.89)    Other testicular dysfunction    Primary hypertension 01/20/2011   Prostatitis, unspecified    Rash 2011   left chest, biopsy 2012 Dr. Charlton Haws, results pending   Skin cancer    Squamous cell carcinoma of skin 11/03/2014   right shoulder cx3 87fu   Tobacco abuse     Patient Active Problem List   Diagnosis Date Noted   History of repair of aneurysm of abdominal aorta using endovascular stent graft 05/14/2023   Thrombosed external hemorrhoid 12/24/2016   Uncomplicated asthma 12/24/2016   High risk medication use 12/24/2016   Prostate cancer screening 12/24/2016   Other emphysema (HCC) 04/15/2015   Erectile dysfunction 12/12/2014   Fatty liver  04/04/2014   Pulmonary nodules 04/04/2014   Gout 12/06/2013   Routine general medical examination at a health care facility 12/06/2013   GERD (gastroesophageal reflux disease) 12/06/2013   Depression 06/29/2013   Musculoskeletal pain 06/07/2013   Lung nodule, multiple 05/26/2013   Gouty arthropathy 02/23/2013   Prediabetes 02/23/2013   Asthma, moderate persistent 03/05/2011   Hyperlipidemia 01/20/2011   Hx of smoking 01/20/2011   Primary hypertension 01/20/2011    Past Surgical History:  Procedure Laterality Date   ABDOMINAL AORTIC ENDOVASCULAR STENT GRAFT  09/22/2012   Procedure: ABDOMINAL AORTIC ENDOVASCULAR STENT GRAFT;  Surgeon: Pryor Ochoa, MD;  Location: [facility] OR;  Service: Vascular;  Laterality: N/A;  GORE; ultrasound guided.   APPENDECTOMY     COLONOSCOPY N/A 09/2023   EVALUATION UNDER ANESTHESIA WITH HEMORRHOIDECTOMY N/A 12/31/2016   Procedure: EXAM UNDER ANESTHESIA WITH HEMORRHOIDECTOMY;  Surgeon: [name], MD;  Location: [facility];  Service: General;  Laterality: N/A;   SKIN CANCER EXCISION     TOE SURGERY  01/2012   joint of left great toe   TONSILLECTOMY     TONSILLECTOMY     TYMPANOPLASTY     WRIST SURGERY     left       Home Medications    Prior to Admission medications   Medication Sig Start  Date End Date Taking? Authorizing Provider  acetaminophen (TYLENOL) 500 MG tablet Take 2 tablets (1,000 mg total) by mouth every 6 (six) hours as needed. 06/03/22   [name], NP  albuterol (VENTOLIN HFA) 108 (90 Base) MCG/ACT inhaler Inhale 2 puffs into the lungs every 6 (six) hours as needed for cough and wheezing. 04/02/23   [name], NP  allopurinol (ZYLOPRIM) 100 MG tablet Take 1 tablet (100 mg total) by mouth daily. 03/06/23   [name], NP  ALPRAZolam Prudy Feeler) 0.25 MG tablet Take 1 tablet (0.25 mg) by mouth daily as needed for anxiety (to last 30 days). 11/24/23   [name], NP  amLODipine (NORVASC) 10 MG  tablet Take 1 tablet (10 mg total) by mouth daily. 10/15/23   [name], NP  aspirin EC 81 MG tablet Take 1 tablet (81 mg total) by mouth daily. Swallow whole. 09/21/23   [name], NP  buPROPion (WELLBUTRIN XL) 150 MG 24 hr tablet Take 1 tablet (150 mg total) by mouth daily. 02/24/24   [name], NP  clindamycin (CLEOCIN-T) 1 % lotion Apply topically daily. 07/13/23 07/12/24  Terri Piedra, DO  colchicine 0.6 MG tablet Take 0.6 mg by mouth as needed. For gout flare up    [provider]  diclofenac (CATAFLAM) 50 MG tablet Take 1 tablet (50 mg total) by mouth 2 (two) times daily. 02/01/24     fluticasone-salmeterol (ADVAIR) 100-50 MCG/ACT AEPB Inhale 1 puff into the lungs 2 (two) times daily. 12/21/23   [name], NP  guaiFENesin (MUCINEX) 600 MG 12 hr tablet Take 1 tablet (600 mg total) by mouth 2 (two) times daily for 14 days. 02/19/24 03/04/24  Medina-Vargas, Monina C, NP  losartan (COZAAR) 100 MG tablet Take 1 tablet (100 mg total) by mouth daily. 02/02/24 05/13/24  [name], NP  pantoprazole (PROTONIX) 40 MG tablet Take 1 tablet (40 mg total) by mouth daily. 01/11/24 01/10/25  [name], NP  sildenafil (VIAGRA) 50 MG tablet Take 1 tablet (50 mg) by mouth as needed for erectile dysfunction. 09/21/23   [name], NP  simvastatin (ZOCOR) 20 MG tablet Take 1 tablet (20 mg total) by mouth at bedtime. 06/02/23   [name], NP  triamcinolone ointment (KENALOG) 0.1 % Apply 1 Application topically 2 (two) times daily. Use for 2 weeks, then stop for 2 weeks. Repeat if needed. 01/11/24   Terri Piedra, DO    Family History Family History  Problem Relation Age of Onset   Cancer Mother        Breast   Heart attack Father 69   COPD Father    Heart disease Father    Osler-Weber-Rendu syndrome Brother    Asthma Daughter    Lupus Daughter    Diabetes Other        Grandson    Colon cancer Neg Hx    Pancreatic cancer Neg Hx     Esophageal cancer Neg Hx    Stomach cancer Neg Hx    Rectal cancer Neg Hx     Social History Social History   Tobacco Use   Smoking status: Former    Current packs/day: 0.00    Average packs/day: 0.3 packs/day for 38.0 years (9.5 ttl pk-yrs)    Types: Cigarettes    Start date: 10/31/1982    Quit date: 10/31/2020    Years since quitting: 3.3    Passive exposure: Never  Smokeless tobacco: Former    Types: Snuff    Quit date: 11/25/2011  Vaping Use   Vaping status: Never Used  Substance Use Topics   Alcohol use: Yes    Alcohol/week: 0.0 standard drinks of alcohol    Comment: occ   Drug use: No     Allergies   Singulair [montelukast]   Review of Systems Review of Systems Per HPI  Physical Exam Triage Vital Signs ED Triage Vitals [03/04/24 1338]  Encounter Vitals Group     BP 128/84     Systolic BP Percentile      Diastolic BP Percentile      Pulse Rate 78     Resp 18     Temp 98 F (36.7 C)     Temp Source Oral     SpO2 92 %     Weight      Height      Head Circumference      Peak Flow      Pain Score 0     Pain Loc      Pain Education      Exclude from Growth Chart    No data found.  Updated Vital Signs BP 128/84 (BP Location: Right Arm)   Pulse 78   Temp 98 F (36.7 C) (Oral)   Resp 18   SpO2 92%   Visual Acuity Right Eye Distance:   Left Eye Distance:   Bilateral Distance:    Right Eye Near:   Left Eye Near:    Bilateral Near:     Physical Exam Vitals and nursing note reviewed.  Constitutional:      General: He is not in acute distress.    Appearance: Normal appearance. He is not toxic-appearing.  HENT:     Head: Normocephalic and atraumatic.     Mouth/Throat:     Mouth: Mucous membranes are moist.  Pulmonary:     Effort: Pulmonary effort is normal. No respiratory distress.  Skin:    General: Skin is warm and dry.     Capillary Refill: Capillary refill takes less than 2 seconds.     Coloration: Skin is not jaundiced or pale.      Findings: Laceration present. No rash.     Comments: Approximately 2.5 cm laceration noted to left anterior index finger at the PIP.  Patient has full range of motion of the left index finger.  He is neurovascularly intact in the distal tip of the finger.  Neurological:     Mental Status: He is alert and oriented to person, place, and time.  Psychiatric:        Behavior: Behavior is cooperative.      UC Treatments / Results  Labs (all labs ordered are listed, but only abnormal results are displayed) Labs Reviewed - No data to display  EKG   Radiology No results found.  Procedures Laceration Repair  Date/Time: 03/04/2024 2:32 PM  Performed by: Valentino Nose, NP Authorized by: Valentino Nose, NP   Consent:    Consent obtained:  Verbal   Consent given by:  Patient   Risks, benefits, and alternatives were discussed: yes     Risks discussed:  Infection, pain, poor cosmetic result, poor wound healing, nerve damage and tendon damage   Alternatives discussed:  Delayed treatment Universal protocol:    Procedure explained and questions answered to patient or proxy's satisfaction: yes     Patient identity confirmed:  Verbally with patient Anesthesia:  Anesthesia method:  Local infiltration   Local anesthetic:  Lidocaine 2% w/o epi Laceration details:    Location:  Finger   Finger location:  L index finger   Length (cm):  2.5 Pre-procedure details:    Preparation:  Patient was prepped and draped in usual sterile fashion Exploration:    Hemostasis achieved with:  Direct pressure   Wound exploration: wound explored through full range of motion and entire depth of wound visualized     Contaminated: no   Treatment:    Area cleansed with:  Chlorhexidine   Amount of cleaning:  Standard Skin repair:    Repair method:  Sutures   Suture size:  4-0   Suture material:  Prolene   Suture technique:  Simple interrupted   Number of sutures:  3 Approximation:     Approximation:  Close Repair type:    Repair type:  Simple Post-procedure details:    Dressing:  Non-adherent dressing and splint for protection   Procedure completion:  Tolerated well, no immediate complications  (including critical care time)  Medications Ordered in UC Medications  Tdap (BOOSTRIX) injection 0.5 mL (has no administration in time range)    Initial Impression / Assessment and Plan / UC Course  I have reviewed the triage vital signs and the nursing notes.  Pertinent labs & imaging results that were available during my care of the patient were reviewed by me and considered in my medical decision making (see chart for details).   Patient is well-appearing, normotensive, afebrile, not tachycardic, not tachypneic, oxygenating well on room air.    1. Laceration of left index finger without foreign body without damage to nail, initial encounter Laceration repair as above Wound care discussed Tdap updated Signs symptoms of infection discussed and when to return to clinic Return in 10 to 14 days for suture removal  The patient was given the opportunity to ask questions.  All questions answered to their satisfaction.  The patient is in agreement to this plan.   Final Clinical Impressions(s) / UC Diagnoses   Final diagnoses:  Laceration of left index finger without foreign body without damage to nail, initial encounter     Discharge Instructions      We put 3 stitches in the cut on your finger today and updated your Tdap.  Please leave the dressing on until tomorrow morning.  Wear the finger splint to protect the area from hyperflexion.  You can take breaks from it occasionally.  Apply a thin layer of antibiotic ointment to the area twice daily.  Return here in 10-14 days to have the stitches removed.  Follow up with [place] sooner if signs or symptoms of infection develop.   ED Prescriptions   None    PDMP not reviewed this encounter.   Valentino Nose,  NP 03/04/24 1433

## 2024-03-07 ENCOUNTER — Other Ambulatory Visit: Payer: Self-pay

## 2024-03-07 ENCOUNTER — Other Ambulatory Visit (HOSPITAL_COMMUNITY): Payer: Self-pay

## 2024-03-07 ENCOUNTER — Other Ambulatory Visit: Payer: Self-pay | Admitting: Nurse Practitioner

## 2024-03-07 MED ORDER — ASPIRIN 81 MG PO TBEC
81.0000 mg | DELAYED_RELEASE_TABLET | Freq: Every day | ORAL | 1 refills | Status: AC
  Filled 2024-03-07: qty 90, 90d supply, fill #0
  Filled 2024-05-17 – 2024-06-14 (×2): qty 90, 90d supply, fill #1

## 2024-03-07 MED ORDER — FLUTICASONE-SALMETEROL 100-50 MCG/ACT IN AEPB
1.0000 | INHALATION_SPRAY | Freq: Two times a day (BID) | RESPIRATORY_TRACT | 1 refills | Status: DC
  Filled 2024-03-07: qty 60, 30d supply, fill #0
  Filled 2024-04-13: qty 60, 30d supply, fill #1

## 2024-03-14 ENCOUNTER — Encounter: Payer: Self-pay | Admitting: Nurse Practitioner

## 2024-03-14 ENCOUNTER — Ambulatory Visit (INDEPENDENT_AMBULATORY_CARE_PROVIDER_SITE_OTHER): Admitting: Nurse Practitioner

## 2024-03-14 VITALS — BP 124/80 | HR 100 | Temp 97.7°F | Ht 73.0 in | Wt 227.0 lb

## 2024-03-14 DIAGNOSIS — S20461A Insect bite (nonvenomous) of right back wall of thorax, initial encounter: Secondary | ICD-10-CM

## 2024-03-14 DIAGNOSIS — Z4802 Encounter for removal of sutures: Secondary | ICD-10-CM | POA: Diagnosis not present

## 2024-03-14 DIAGNOSIS — S61218D Laceration without foreign body of other finger without damage to nail, subsequent encounter: Secondary | ICD-10-CM

## 2024-03-14 DIAGNOSIS — W57XXXA Bitten or stung by nonvenomous insect and other nonvenomous arthropods, initial encounter: Secondary | ICD-10-CM

## 2024-03-14 NOTE — Progress Notes (Signed)
 Careteam: Patient Care Team: Verma Gobble, NP as PCP - General (Nurse Practitioner) [name], Azell Boll, NP (Inactive) as Nurse Practitioner (Vascular Surgery) Oza Blumenthal, MD as Consulting Physician (General Surgery) Maire Scot, MD as Consulting Physician (Pulmonary Disease) Knox Perl, MD as Consulting Physician (Cardiology) Dorthey Gave, PA-C as Physician Assistant (Dermatology) Sergio Dandy, MD as Consulting Physician (Gastroenterology)  PLACE OF SERVICE:  [facility]  Advanced Directive information    Allergies  Allergen Reactions   Singulair  [Montelukast ]     Chief Complaint  Patient presents with   Tick Removal    Patient with area of concern for tick bite on upper right side of back, red spot on right upper leg, and stitches removal of finger on left hand.     HPI:  Discussed the use of AI scribe software for clinical note transcription with the patient, who gave verbal consent to proceed.  History of Present Illness   He is a 68 year old male who presents for a tick bite and removal of stitches from a finger laceration.  He has a tick bite on his back, initially mistaken for a zit by both him and his wife. The area is slightly irritated. He is concerned about the potential for developing an allergy to red meat, as he knows someone who experienced this after a tick bite.  Approximately ten days ago, he sustained a deep cut on his finger while working with pottery. He received stitches and an updated TDAP vaccination at urgent care. There are no signs of infection or pain at the site, he is using antibiotic ointment.      Review of Systems:  Review of Systems  Constitutional:  Negative for chills, fever and weight loss.  HENT:  Negative for tinnitus.   Respiratory:  Negative for cough, sputum production and shortness of breath.   Cardiovascular:  Negative for chest pain, palpitations and leg swelling.  Gastrointestinal:  Negative  for abdominal pain, constipation, diarrhea and heartburn.  Genitourinary:  Negative for dysuria, frequency and urgency.  Musculoskeletal:  Negative for back pain, falls, joint pain and myalgias.  Skin:  Positive for itching (to back).  Neurological:  Negative for dizziness and headaches.  Psychiatric/Behavioral:  Negative for depression and memory loss. The patient does not have insomnia.     Past Medical History:  Diagnosis Date   Abdominal aortic aneurysm (AAA) without rupture (HCC) 11/01/2013   Abdominal aortic aneurysm [facility])    sees Dr. Berry Bristol for cardiac f/u, [phone]   Arthritis    lower back   Asthma    followed by Dr. Randel Buss   Chronic airway obstruction, not elsewhere classified    Dyspnea    normal PFT April 2012   External hemorrhoids without mention of complication    GERD (gastroesophageal reflux disease)    Gout    H/O hiatal hernia    Hemorrhoids    HTN (hypertension)    hx of sees Dr. Wing Hauser   Hyperlipidemia    Insomnia, unspecified    Other abnormal blood chemistry    Other dyspnea and respiratory abnormality    Other malaise and fatigue    Other specified erythematous condition(695.89)    Other testicular dysfunction    Primary hypertension 01/20/2011   Prostatitis, unspecified    Rash 2011   left chest, biopsy 2012 Dr. Wilbern Hancock, results pending   Skin cancer    Squamous cell carcinoma of skin 11/03/2014   right shoulder cx3 13fu   Tobacco abuse  Past Surgical History:  Procedure Laterality Date   ABDOMINAL AORTIC ENDOVASCULAR STENT GRAFT  09/22/2012   Procedure: ABDOMINAL AORTIC ENDOVASCULAR STENT GRAFT;  Surgeon: Palma Bob, MD;  Location: [facility] OR;  Service: Vascular;  Laterality: N/A;  GORE; ultrasound guided.   APPENDECTOMY     COLONOSCOPY N/A 09/2023   EVALUATION UNDER ANESTHESIA WITH HEMORRHOIDECTOMY N/A 12/31/2016   Procedure: EXAM UNDER ANESTHESIA WITH HEMORRHOIDECTOMY;  Surgeon: Oza Blumenthal, MD;  Location: [facility];  Service: General;  Laterality: N/A;   SKIN CANCER EXCISION     TOE SURGERY  01/2012   joint of left great toe   TONSILLECTOMY     TONSILLECTOMY     TYMPANOPLASTY     WRIST SURGERY     left   Social History:   reports that he quit smoking about 3 years ago. His smoking use included cigarettes. He started smoking about 41 years ago. He has a 9.5 pack-year smoking history. He has never been exposed to tobacco smoke. He quit smokeless tobacco use about 12 years ago.  His smokeless tobacco use included snuff. He reports current alcohol use. He reports that he does not use drugs.  Family History  Problem Relation Age of Onset   Cancer Mother        Breast   Heart attack Father 51   COPD Father    Heart disease Father    Osler-Weber-Rendu syndrome Brother    Asthma Daughter    Lupus Daughter    Diabetes Other        Grandson    Colon cancer Neg Hx    Pancreatic cancer Neg Hx    Esophageal cancer Neg Hx    Stomach cancer Neg Hx    Rectal cancer Neg Hx     Medications: Patient's Medications  New Prescriptions   No medications on file  Previous Medications   ACETAMINOPHEN  (TYLENOL ) 500 MG TABLET    Take 2 tablets (1,000 mg total) by mouth every 6 (six) hours as needed.   ALBUTEROL  (VENTOLIN  HFA) 108 (90 BASE) MCG/ACT INHALER    Inhale 2 puffs into the lungs every 6 (six) hours as needed for cough and wheezing.   ALLOPURINOL  (ZYLOPRIM ) 100 MG TABLET    Take 1 tablet (100 mg total) by mouth daily.   ALPRAZOLAM  (XANAX ) 0.25 MG TABLET    Take 1 tablet (0.25 mg) by mouth daily as needed for anxiety (to last 30 days).   AMLODIPINE  (NORVASC ) 10 MG TABLET    Take 1 tablet (10 mg total) by mouth daily.   ASPIRIN  EC 81 MG TABLET    Take 1 tablet (81 mg total) by mouth daily. Swallow whole.   BUPROPION  (WELLBUTRIN  XL) 150 MG 24 HR TABLET    Take 1 tablet (150 mg total) by mouth daily.   CLINDAMYCIN  (CLEOCIN -T) 1 % LOTION    Apply topically daily.   COLCHICINE  0.6 MG TABLET     Take 0.6 mg by mouth as needed. For gout flare up   DICLOFENAC  (CATAFLAM ) 50 MG TABLET    Take 1 tablet (50 mg total) by mouth 2 (two) times daily.   FLUTICASONE -SALMETEROL (ADVAIR ) 100-50 MCG/ACT AEPB    Inhale 1 puff into the lungs 2 (two) times daily.   LOSARTAN  (COZAAR ) 100 MG TABLET    Take 1 tablet (100 mg total) by mouth daily.   PANTOPRAZOLE  (PROTONIX ) 40 MG TABLET    Take 1 tablet (40 mg total) by mouth daily.  SILDENAFIL  (VIAGRA ) 50 MG TABLET    Take 1 tablet (50 mg) by mouth as needed for erectile dysfunction.   SIMVASTATIN  (ZOCOR ) 20 MG TABLET    Take 1 tablet (20 mg total) by mouth at bedtime.   TRIAMCINOLONE  OINTMENT (KENALOG ) 0.1 %    Apply 1 Application topically 2 (two) times daily. Use for 2 weeks, then stop for 2 weeks. Repeat if needed.  Modified Medications   No medications on file  Discontinued Medications   No medications on file    Physical Exam:  Vitals:   03/14/24 0929  BP: 124/80  Pulse: 100  Temp: 97.7 F (36.5 C)  SpO2: 98%  Weight: 227 lb (103 kg)  Height: 6\' 1"  (1.854 m)   Body mass index is 29.95 kg/m. Wt Readings from Last 3 Encounters:  03/14/24 227 lb (103 kg)  02/19/24 230 lb (104.3 kg)  02/19/24 230 lb 9.6 oz (104.6 kg)    Physical Exam Constitutional:      Appearance: Normal appearance.  Skin:    Findings: Erythema (slight erythema noted to site where tick was removed) present.  Neurological:     Mental Status: He is alert. Mental status is at baseline.  Psychiatric:        Mood and Affect: Mood normal.     Labs reviewed: Basic Metabolic Panel: Recent Labs    08/17/23 1137 02/22/24 0851  NA 142 140  K 4.4 4.4  CL 104 104  CO2 28 27  GLUCOSE 92 99  BUN 12 17  CREATININE 1.16 1.16  CALCIUM 9.6 9.6   Liver Function Tests: Recent Labs    08/17/23 1137 02/22/24 0851  AST 29 28  ALT 30 30  BILITOT 0.7 0.5  PROT 6.8 7.0   No results for input(s): "LIPASE", "AMYLASE" in the last 8760 hours. No results for input(s):  "AMMONIA" in the last 8760 hours. CBC: Recent Labs    08/17/23 1137 02/22/24 0851  WBC 5.7 9.3  NEUTROABS 3,243 6,724  HGB 14.6 14.1  HCT 43.4 41.9  MCV 93.1 93.7  PLT 292 316   Lipid Panel: Recent Labs    02/22/24 0851  CHOL 131  HDL 50  LDLCALC 61  TRIG 116  CHOLHDL 2.6   TSH: No results for input(s): "TSH" in the last 8760 hours. A1C: Lab Results  Component Value Date   HGBA1C 5.4 02/22/2024     Assessment/Plan  Tick bite of right back wall of thorax, initial encounter Recent tick bite with localized irritation. No systemic infection. Discussed alpha-gal syndrome risk. - Monitor for increased redness or irritation. - Report any worsening symptoms, worsening of redness/rash - Use antibiotic ointment on the bite if desired.  Visit for suture removal Removed without difficulty, steri strips applied.   Laceration of index finger without foreign body without damage to nail, unspecified laterality, subsequent encounter Laceration treated with stitches, now removed. No infection, slight maceration due to moisture. Advised drying for healing.   Neil Brickell K. Denney Fisherman [facility] [phone]

## 2024-03-15 ENCOUNTER — Other Ambulatory Visit: Payer: Self-pay | Admitting: Nurse Practitioner

## 2024-03-16 ENCOUNTER — Other Ambulatory Visit: Payer: Self-pay

## 2024-03-16 ENCOUNTER — Other Ambulatory Visit (HOSPITAL_COMMUNITY): Payer: Self-pay

## 2024-03-16 MED ORDER — ALLOPURINOL 100 MG PO TABS
100.0000 mg | ORAL_TABLET | Freq: Every day | ORAL | 1 refills | Status: DC
Start: 2024-03-16 — End: 2024-09-06
  Filled 2024-03-16: qty 90, 90d supply, fill #0
  Filled 2024-05-17 – 2024-06-14 (×2): qty 90, 90d supply, fill #1

## 2024-03-18 ENCOUNTER — Other Ambulatory Visit (HOSPITAL_COMMUNITY): Payer: Self-pay

## 2024-04-13 ENCOUNTER — Other Ambulatory Visit: Payer: Self-pay | Admitting: Nurse Practitioner

## 2024-04-13 ENCOUNTER — Other Ambulatory Visit (HOSPITAL_COMMUNITY): Payer: Self-pay

## 2024-04-13 ENCOUNTER — Other Ambulatory Visit: Payer: Self-pay

## 2024-04-13 DIAGNOSIS — N529 Male erectile dysfunction, unspecified: Secondary | ICD-10-CM

## 2024-04-13 MED ORDER — SILDENAFIL CITRATE 50 MG PO TABS
50.0000 mg | ORAL_TABLET | ORAL | 5 refills | Status: DC | PRN
  Filled 2024-04-13: qty 6, 30d supply, fill #0
  Filled 2024-05-17: qty 6, 30d supply, fill #1

## 2024-04-14 ENCOUNTER — Other Ambulatory Visit (HOSPITAL_COMMUNITY): Payer: Self-pay

## 2024-04-25 ENCOUNTER — Other Ambulatory Visit: Payer: Self-pay

## 2024-04-25 ENCOUNTER — Other Ambulatory Visit (HOSPITAL_COMMUNITY): Payer: Self-pay

## 2024-04-25 ENCOUNTER — Other Ambulatory Visit: Payer: Self-pay | Admitting: Nurse Practitioner

## 2024-04-25 DIAGNOSIS — I10 Essential (primary) hypertension: Secondary | ICD-10-CM

## 2024-04-25 MED ORDER — AMLODIPINE BESYLATE 10 MG PO TABS
10.0000 mg | ORAL_TABLET | Freq: Every day | ORAL | 1 refills | Status: DC
  Filled 2024-04-25: qty 90, 90d supply, fill #0
  Filled 2024-07-18: qty 90, 90d supply, fill #1

## 2024-05-13 ENCOUNTER — Other Ambulatory Visit (HOSPITAL_COMMUNITY): Payer: Self-pay

## 2024-05-13 ENCOUNTER — Telehealth: Payer: Self-pay

## 2024-05-13 DIAGNOSIS — N529 Male erectile dysfunction, unspecified: Secondary | ICD-10-CM

## 2024-05-13 DIAGNOSIS — F419 Anxiety disorder, unspecified: Secondary | ICD-10-CM

## 2024-05-13 NOTE — Telephone Encounter (Signed)
 Copied from CRM [id number]. Topic: Clinical - Medication Question >> May 13, 2024  9:11 AM Georgeann Kindred wrote: Reason for CRM: Patient states that his Medicare representative would like to know if the patient could substitute fluticasone -salmeterol (ADVAIR ) 100-50 MCG/ACT AEPB with albuterol  (VENTOLIN  HFA) 108 (90 Base) MCG/ACT inhaler due to the cost of the fluticasone -salmeterol (ADVAIR ) 100-50 MCG/ACT AEPB  being too expensive for the patient. He states that the pharmacy is now charging 87.00 for the ADVAIR  when the medication was originally 47.00 as medication is a Tier 3. Please contact patient [phone]. Please Advise   Message sent to Verma Gobble, NP

## 2024-05-16 ENCOUNTER — Other Ambulatory Visit (HOSPITAL_COMMUNITY): Payer: Self-pay

## 2024-05-16 ENCOUNTER — Other Ambulatory Visit: Payer: Self-pay

## 2024-05-16 MED ORDER — FLUTICASONE-SALMETEROL 100-50 MCG/ACT IN AEPB
1.0000 | INHALATION_SPRAY | Freq: Two times a day (BID) | RESPIRATORY_TRACT | 3 refills | Status: DC
Start: 2024-05-16 — End: 2024-05-17
  Filled 2024-05-16: qty 60, 30d supply, fill #0
  Filled 2024-05-17: qty 60, 30d supply, fill #1

## 2024-05-16 NOTE — Telephone Encounter (Signed)
 Rx sent to pharmacy for fluticasone -salmeterol (ADVAIR ) 100-50 MCG/ACT AEPB

## 2024-05-16 NOTE — Telephone Encounter (Signed)
 In the CRM Note patient is wanting to SUBSTITUTE the Advair  with Ventolin  due to the Cost of the Advair .   Needs a Rx sent for Ventolin  instead of Advair .  Please Advise.

## 2024-05-16 NOTE — Telephone Encounter (Signed)
 The 3 alternatives given to patient was: 1)Fluticasone  Propionate 2)Albuterol  Sulfate AER HFA 3)Arformoterol Tartrate Nebulized Soln 15/2ml

## 2024-05-16 NOTE — Addendum Note (Signed)
 Addended by: Kacie Huxtable K on: 05/16/2024 01:39 PM   Modules accepted: Orders

## 2024-05-16 NOTE — Telephone Encounter (Signed)
 Patient calling because he has not heard back and would like a call back as soon as possible regarding prescription.

## 2024-05-16 NOTE — Telephone Encounter (Signed)
 Called and spoke with patient and he stated that he will check with his insurance and give us  a call back with alternatives.

## 2024-05-16 NOTE — Telephone Encounter (Signed)
 That is not a substitute for advair , albuterol  is short acting rescue inhaler- he needs a long acting, unsure if he can get a list of preferred medications

## 2024-05-17 ENCOUNTER — Other Ambulatory Visit: Payer: Self-pay

## 2024-05-17 ENCOUNTER — Other Ambulatory Visit (HOSPITAL_COMMUNITY): Payer: Self-pay

## 2024-05-17 MED ORDER — ALPRAZOLAM 0.25 MG PO TABS
0.2500 mg | ORAL_TABLET | Freq: Every day | ORAL | 5 refills | Status: DC | PRN
  Filled 2024-06-14: qty 20, 30d supply, fill #0
  Filled 2024-07-13: qty 20, 30d supply, fill #1
  Filled 2024-08-19: qty 20, 30d supply, fill #2
  Filled 2024-09-26: qty 20, 30d supply, fill #3
  Filled 2024-11-08: qty 20, 30d supply, fill #4

## 2024-05-17 MED ORDER — SILDENAFIL CITRATE 50 MG PO TABS
50.0000 mg | ORAL_TABLET | ORAL | 5 refills | Status: AC | PRN
  Filled 2024-06-14: qty 6, 30d supply, fill #0
  Filled 2024-07-18: qty 6, 30d supply, fill #1
  Filled 2024-08-19: qty 6, 30d supply, fill #2
  Filled 2024-09-26: qty 6, 30d supply, fill #3
  Filled 2024-11-08: qty 6, 30d supply, fill #4
  Filled 2024-12-09: qty 6, 30d supply, fill #5

## 2024-05-17 MED ORDER — FLUTICASONE-SALMETEROL 100-50 MCG/ACT IN AEPB
1.0000 | INHALATION_SPRAY | Freq: Two times a day (BID) | RESPIRATORY_TRACT | 3 refills | Status: DC
Start: 2024-05-17 — End: 2024-10-13
  Filled 2024-05-17 – 2024-06-14 (×2): qty 60, 30d supply, fill #0
  Filled 2024-07-13: qty 60, 30d supply, fill #1
  Filled 2024-08-11 (×5): qty 60, 30d supply, fill #2
  Filled 2024-09-11: qty 60, 30d supply, fill #3

## 2024-05-17 NOTE — Telephone Encounter (Signed)
 So just to let him know none of these are similar hat he is taking- for instance- advair  is 2 medication in one inhaler- this are all single medication. If he wants an equivalent would need to do the arformoterol- which is a nebulizer AND fluticasone  twice daily.  Does he have a nebulizer he can use?

## 2024-05-17 NOTE — Telephone Encounter (Signed)
 Patient would like to just stick with the Advair  since it does help.   Requesting Rx's for Advair , Xanax  and Viagra  to be sent to [facility]. Patient going out of town on Thursday.  Epic LR: 11/24/2023 Contract Date: 01/02/2022 added note to upcoming appointment to update.    Pended and sent to [facility] for approval.

## 2024-05-17 NOTE — Addendum Note (Signed)
 Addended by: LYNNANN COUGH A on: 05/17/2024 09:36 AM   Modules accepted: Orders

## 2024-05-30 ENCOUNTER — Other Ambulatory Visit (HOSPITAL_COMMUNITY): Payer: Self-pay

## 2024-05-30 ENCOUNTER — Other Ambulatory Visit: Payer: Self-pay | Admitting: Nurse Practitioner

## 2024-05-30 DIAGNOSIS — E782 Mixed hyperlipidemia: Secondary | ICD-10-CM

## 2024-05-30 MED ORDER — SIMVASTATIN 20 MG PO TABS
20.0000 mg | ORAL_TABLET | Freq: Every day | ORAL | 3 refills | Status: AC
  Filled 2024-05-30: qty 90, 90d supply, fill #0
  Filled 2024-08-23: qty 90, 90d supply, fill #1
  Filled 2024-11-21: qty 90, 90d supply, fill #2

## 2024-06-03 ENCOUNTER — Other Ambulatory Visit (HOSPITAL_COMMUNITY): Payer: Self-pay

## 2024-06-10 ENCOUNTER — Other Ambulatory Visit (HOSPITAL_COMMUNITY): Payer: Self-pay

## 2024-06-10 NOTE — Telephone Encounter (Unsigned)
 Copied from CRM [id number]. Topic: Clinical - Medication Refill >> Jun 10, 2024  9:54 AM Suzette B wrote: Medication:  diclofenac  (CATAFLAM ) 50 MG tablet  Has the patient contacted their pharmacy? Yes Patient was told he needed a new prescription for this medication This is the patient's preferred pharmacy:  DARR[facility] - [facility] [address] [place] [address] Phone: [phone] Fax: [phone]    Is this the correct pharmacy for this prescription? Yes If no, delete pharmacy and type the correct one.   Has the prescription been filled recently? Yes  Is the patient out of the medication? Yes  Has the patient been seen for an appointment in the last year OR does the patient have an upcoming appointment? Yes  Can we respond through MyChart? Yes  Agent: Please be advised that Rx refills may take up to 3 business days. We ask that you follow-up with your pharmacy.

## 2024-06-13 ENCOUNTER — Telehealth: Admitting: *Deleted

## 2024-06-13 NOTE — Telephone Encounter (Signed)
 Copied from CRM [id number]. Topic: Clinical - Medication Refill >> Jun 10, 2024  9:54 AM Farrel B wrote: Medication:  diclofenac  (CATAFLAM ) 50 MG tablet  Has the patient contacted their pharmacy? Yes Patient was told he needed a new prescription for this medication This is the patient's preferred pharmacy:  DARRYLE LONG - [facility] [address] [place] [address] Phone: [phone] Fax: [phone]    Is this the correct pharmacy for this prescription? Yes If no, delete pharmacy and type the correct one.   Has the prescription been filled recently? Yes  Is the patient out of the medication? Yes  Has the patient been seen for an appointment in the last year OR does the patient have an upcoming appointment? Yes  Can we respond through MyChart? Yes  Agent: Please be advised that Rx refills Lynwood Kubisiak take up to 3 business days. We ask that you follow-up with your pharmacy. >> Jun 13, 2024  2:39 PM Susanna ORN wrote: Patient called frustrated that no one returned his phone call on Friday. He states he is completely out of medication and is waiting to have it refilled. Please give patient a call once this has been completed and sent to the pharmacy.

## 2024-06-13 NOTE — Telephone Encounter (Signed)
 Not without an appt to discuss, he needs to get it through prescribing provider

## 2024-06-13 NOTE — Telephone Encounter (Signed)
 Patient stated that he has already discussed this medication with you at a previous appointment. Has been taking this medication since 2022. Would like for you to prescribe.   Please Advise.

## 2024-06-13 NOTE — Telephone Encounter (Signed)
 Patient is requesting refill.  Dr. Gerldine Maizes from [facility] prescribed medication on 02/01/2024.   Patient called Friday and is waiting on refill or someone to call him.  Is this ok to refill.  Please Advise.

## 2024-06-14 ENCOUNTER — Other Ambulatory Visit (HOSPITAL_COMMUNITY): Payer: Self-pay

## 2024-06-14 NOTE — Telephone Encounter (Signed)
 Yes we discussed to minimize use of this medication due to side effects, recommended tylenol  1000 mg by mouth three times daily.

## 2024-06-14 NOTE — Telephone Encounter (Signed)
 Spoke with patient, patient states he was still taking medication as needed, however he will take the tylenol  instead and call if a sooner appointment is needed

## 2024-06-15 ENCOUNTER — Other Ambulatory Visit (HOSPITAL_COMMUNITY): Payer: Self-pay

## 2024-07-14 ENCOUNTER — Other Ambulatory Visit: Payer: Self-pay

## 2024-07-14 ENCOUNTER — Other Ambulatory Visit (HOSPITAL_COMMUNITY): Payer: Self-pay

## 2024-07-18 ENCOUNTER — Other Ambulatory Visit (HOSPITAL_COMMUNITY): Payer: Self-pay

## 2024-08-11 ENCOUNTER — Ambulatory Visit: Payer: Medicare Other | Admitting: Dermatology

## 2024-08-11 ENCOUNTER — Other Ambulatory Visit (HOSPITAL_COMMUNITY): Payer: Self-pay

## 2024-08-11 ENCOUNTER — Other Ambulatory Visit: Payer: Self-pay

## 2024-08-11 ENCOUNTER — Encounter: Payer: Self-pay | Admitting: Dermatology

## 2024-08-11 VITALS — BP 117/71 | HR 62

## 2024-08-11 DIAGNOSIS — L409 Psoriasis, unspecified: Secondary | ICD-10-CM

## 2024-08-11 DIAGNOSIS — L578 Other skin changes due to chronic exposure to nonionizing radiation: Secondary | ICD-10-CM | POA: Diagnosis not present

## 2024-08-11 DIAGNOSIS — L814 Other melanin hyperpigmentation: Secondary | ICD-10-CM | POA: Diagnosis not present

## 2024-08-11 DIAGNOSIS — D229 Melanocytic nevi, unspecified: Secondary | ICD-10-CM

## 2024-08-11 DIAGNOSIS — L821 Other seborrheic keratosis: Secondary | ICD-10-CM

## 2024-08-11 DIAGNOSIS — D1801 Hemangioma of skin and subcutaneous tissue: Secondary | ICD-10-CM

## 2024-08-11 DIAGNOSIS — Z1283 Encounter for screening for malignant neoplasm of skin: Secondary | ICD-10-CM | POA: Diagnosis not present

## 2024-08-11 DIAGNOSIS — L820 Inflamed seborrheic keratosis: Secondary | ICD-10-CM | POA: Diagnosis not present

## 2024-08-11 DIAGNOSIS — D485 Neoplasm of uncertain behavior of skin: Secondary | ICD-10-CM

## 2024-08-11 MED ORDER — TRIAMCINOLONE ACETONIDE 0.1 % EX OINT
1.0000 | TOPICAL_OINTMENT | Freq: Two times a day (BID) | CUTANEOUS | 4 refills | Status: AC
  Filled 2024-08-11 (×2): qty 454, 90d supply, fill #0
  Filled 2024-11-08: qty 454, 90d supply, fill #1

## 2024-08-11 MED ORDER — CLOBETASOL PROPIONATE 0.05 % EX SOLN
1.0000 | Freq: Two times a day (BID) | CUTANEOUS | 0 refills | Status: AC
  Filled 2024-08-11 (×2): qty 50, 25d supply, fill #0

## 2024-08-11 NOTE — Patient Instructions (Addendum)

## 2024-08-11 NOTE — Progress Notes (Signed)
 Total Body Skin Exam (TBSE) Visit   Subjective  Aaron [name] is a 68 y.o. male who presents for the following: Skin Cancer Screening and Full Body Skin Exam  Patient presents today for follow up visit for TBSE. Patient was last evaluated on 01/11/24. Patient denies medication changes. Patient reports he does have spots, moles and lesions of concern to be evaluated (Scalp and Face). Patient reports throughout his lifetime he has had severe sun exposure. Currently, patient reports if he has excessive sun exposure, he  does apply sunscreen and/or wears protective coverings. Patient reports he  has hx of bx (SCC- Right Shoulder 2015,AK- Right Cheek 2018, SK & Lichenoid Keratosis- Medial Chest 2024). Patient denies  family history of skin cancers. The patient has spots, moles and lesions to be evaluated, some may be new or changing and the patient has concerns that these could be cancer.  The following portions of the chart were reviewed this encounter and updated as appropriate: medications, allergies, medical history  Review of Systems:  No other skin or systemic complaints except as noted in HPI or Assessment and Plan.  Objective  Well appearing patient in no apparent distress; mood and affect are within normal limits.  A full examination was performed including scalp, head, eyes, ears, nose, lips, neck, chest, axillae, abdomen, back, buttocks, bilateral upper extremities, bilateral lower extremities, hands, feet, fingers, toes, fingernails, and toenails. All findings within normal limits unless otherwise noted below.   Relevant physical exam findings are noted in the Assessment and Plan.   Left Shoulder - Posterior 1 cm pink pearly plaque Left Posterior Shoulder  Assessment & Plan   LENTIGINES, SEBORRHEIC KERATOSES, HEMANGIOMAS - Benign normal skin lesions - Benign-appearing - Call for any changes  MELANOCYTIC NEVI - Tan-brown and/or pink-flesh-colored symmetric macules and papules -  Benign appearing on exam today - Observation - Call clinic for new or changing moles - Recommend daily use of broad spectrum spf 30+ sunscreen to sun-exposed areas.   ACTINIC DAMAGE - Chronic condition, secondary to cumulative UV/sun exposure - diffuse scaly erythematous macules with underlying dyspigmentation - Recommend daily broad spectrum sunscreen SPF 30+ to sun-exposed areas, reapply every 2 hours as needed.  - Staying in the shade or wearing long sleeves, sun glasses (UVA+UVB protection) and wide brim hats (4-inch brim around the entire circumference of the hat) are also recommended for sun protection.  - Call for new or changing lesions.  PSORIASIS Exam: Well-demarcated erythematous papules/plaques with silvery scale, guttate pink scaly papules. 15% BSA.  Flared  Patient reports joint pain  Psoriasis is a chronic non-curable, but treatable genetic/hereditary disease that may have other systemic features affecting other organ systems such as joints (Psoriatic Arthritis). It is associated with an increased risk of inflammatory bowel disease, heart disease, non-alcoholic fatty liver disease, and depression.  Treatments include light and laser treatments; topical medications; and systemic medications including oral and injectables.  Treatment Plan: - Advised to continue [facility]  - Prescribed Clobetasol  Solution to the scalp - Plan to follow up in February to reassess - If topicals are ineffective, plan to discuss starting Biologic.  SKIN CANCER SCREENING PERFORMED TODAY.    NEOPLASM OF UNCERTAIN BEHAVIOR OF SKIN Left Shoulder - Posterior Epidermal / dermal shaving  Lesion diameter (cm):  1 Informed consent: discussed and consent obtained   Timeout: patient name, date of birth, surgical site, and procedure verified   Procedure prep:  Patient was prepped and draped in usual sterile fashion Prep type:  Isopropyl alcohol Anesthesia: the lesion was anesthetized in a standard  fashion   Anesthetic:  1% lidocaine  w/ epinephrine 1-100,000 buffered w/ 8.4% NaHCO3 Instrument used: DermaBlade   Hemostasis achieved with: pressure and aluminum chloride   Outcome: patient tolerated procedure well   Post-procedure details: sterile dressing applied and wound care instructions given   Dressing type: bandage and petrolatum    Specimen A - Surgical pathology Differential Diagnosis: R/O BCC  Check Margins: No PSORIASIS   Related Medications clobetasol  (TEMOVATE ) 0.05 % external solution Apply 1 Application topically 2 (two) times daily. triamcinolone  ointment (KENALOG ) 0.1 % Apply 1 Application topically 2 (two) times daily. Use for 2 weeks, then stop for 2 weeks. Repeat if needed. SKIN EXAM FOR MALIGNANT NEOPLASM   MULTIPLE BENIGN MELANOCYTIC NEVI   CHERRY ANGIOMA   LENTIGINES   SEBORRHEIC KERATOSIS   ACTINIC SKIN DAMAGE    Return in about 1 year (around 08/11/2025) for TBSE. Return in February for Psoriasis F/U  I, Jetta Ager, am acting as scribe for [employer], DO.  Documentation: I have reviewed the above documentation for accuracy and completeness, and I agree with the above.  Delon Lenis, DO

## 2024-08-12 ENCOUNTER — Other Ambulatory Visit (HOSPITAL_COMMUNITY): Payer: Self-pay

## 2024-08-12 LAB — SURGICAL PATHOLOGY

## 2024-08-15 ENCOUNTER — Ambulatory Visit: Payer: Self-pay | Admitting: Dermatology

## 2024-08-19 ENCOUNTER — Other Ambulatory Visit: Payer: Self-pay | Admitting: Nurse Practitioner

## 2024-08-19 ENCOUNTER — Other Ambulatory Visit (HOSPITAL_COMMUNITY): Payer: Self-pay

## 2024-08-19 DIAGNOSIS — F330 Major depressive disorder, recurrent, mild: Secondary | ICD-10-CM

## 2024-08-22 ENCOUNTER — Other Ambulatory Visit (HOSPITAL_COMMUNITY): Payer: Self-pay

## 2024-08-22 ENCOUNTER — Other Ambulatory Visit: Payer: Self-pay

## 2024-08-22 MED ORDER — BUPROPION HCL ER (XL) 150 MG PO TB24
150.0000 mg | ORAL_TABLET | Freq: Every day | ORAL | 1 refills | Status: AC
  Filled 2024-08-22: qty 90, 90d supply, fill #0
  Filled 2024-11-21: qty 90, 90d supply, fill #1

## 2024-08-24 ENCOUNTER — Other Ambulatory Visit (HOSPITAL_COMMUNITY): Payer: Self-pay

## 2024-09-06 ENCOUNTER — Other Ambulatory Visit: Payer: Self-pay | Admitting: Nurse Practitioner

## 2024-09-06 ENCOUNTER — Other Ambulatory Visit (HOSPITAL_COMMUNITY): Payer: Self-pay

## 2024-09-06 DIAGNOSIS — J4540 Moderate persistent asthma, uncomplicated: Secondary | ICD-10-CM

## 2024-09-06 MED ORDER — ALLOPURINOL 100 MG PO TABS
100.0000 mg | ORAL_TABLET | Freq: Every day | ORAL | 1 refills | Status: AC
  Filled 2024-09-06: qty 90, 90d supply, fill #0
  Filled 2024-12-28: qty 90, 90d supply, fill #1

## 2024-09-06 MED ORDER — ALBUTEROL SULFATE HFA 108 (90 BASE) MCG/ACT IN AERS
2.0000 | INHALATION_SPRAY | Freq: Four times a day (QID) | RESPIRATORY_TRACT | 3 refills | Status: AC | PRN
  Filled 2024-09-06: qty 20.1, 75d supply, fill #0

## 2024-09-11 ENCOUNTER — Other Ambulatory Visit (HOSPITAL_COMMUNITY): Payer: Self-pay

## 2024-09-12 ENCOUNTER — Encounter: Payer: Self-pay | Admitting: Nurse Practitioner

## 2024-09-12 ENCOUNTER — Ambulatory Visit (INDEPENDENT_AMBULATORY_CARE_PROVIDER_SITE_OTHER): Admitting: Nurse Practitioner

## 2024-09-12 ENCOUNTER — Other Ambulatory Visit (HOSPITAL_COMMUNITY): Payer: Self-pay

## 2024-09-12 ENCOUNTER — Other Ambulatory Visit: Payer: Self-pay

## 2024-09-12 VITALS — BP 128/84 | HR 81 | Temp 97.3°F | Ht 73.0 in | Wt 229.0 lb

## 2024-09-12 DIAGNOSIS — M150 Primary generalized (osteo)arthritis: Secondary | ICD-10-CM | POA: Insufficient documentation

## 2024-09-12 DIAGNOSIS — J4540 Moderate persistent asthma, uncomplicated: Secondary | ICD-10-CM | POA: Diagnosis not present

## 2024-09-12 DIAGNOSIS — M109 Gout, unspecified: Secondary | ICD-10-CM

## 2024-09-12 DIAGNOSIS — I10 Essential (primary) hypertension: Secondary | ICD-10-CM | POA: Diagnosis not present

## 2024-09-12 DIAGNOSIS — E782 Mixed hyperlipidemia: Secondary | ICD-10-CM

## 2024-09-12 DIAGNOSIS — F330 Major depressive disorder, recurrent, mild: Secondary | ICD-10-CM

## 2024-09-12 DIAGNOSIS — F419 Anxiety disorder, unspecified: Secondary | ICD-10-CM | POA: Insufficient documentation

## 2024-09-12 DIAGNOSIS — Z23 Encounter for immunization: Secondary | ICD-10-CM | POA: Diagnosis not present

## 2024-09-12 DIAGNOSIS — L409 Psoriasis, unspecified: Secondary | ICD-10-CM | POA: Diagnosis not present

## 2024-09-12 LAB — COMPREHENSIVE METABOLIC PANEL WITH GFR
AG Ratio: 2 (calc) (ref 1.0–2.5)
ALT: 33 U/L (ref 9–46)
AST: 31 U/L (ref 10–35)
Albumin: 4.7 g/dL (ref 3.6–5.1)
Alkaline phosphatase (APISO): 91 U/L (ref 35–144)
BUN: 13 mg/dL (ref 7–25)
CO2: 26 mmol/L (ref 20–32)
Calcium: 9.7 mg/dL (ref 8.6–10.3)
Chloride: 104 mmol/L (ref 98–110)
Creat: 1.03 mg/dL (ref 0.70–1.35)
Globulin: 2.4 g/dL (ref 1.9–3.7)
Glucose, Bld: 109 mg/dL (ref 65–139)
Potassium: 4.4 mmol/L (ref 3.5–5.3)
Sodium: 139 mmol/L (ref 135–146)
Total Bilirubin: 0.7 mg/dL (ref 0.2–1.2)
Total Protein: 7.1 g/dL (ref 6.1–8.1)
eGFR: 81 mL/min/1.73m2 (ref >=60)

## 2024-09-12 LAB — CBC WITH DIFFERENTIAL/PLATELET
Absolute Lymphocytes: 1202 {cells}/uL (ref 850–3900)
Absolute Monocytes: 592 {cells}/uL (ref 200–950)
Basophils Absolute: 61 {cells}/uL (ref 0–200)
Basophils Relative: 1 %
Eosinophils Absolute: 201 {cells}/uL (ref 15–500)
Eosinophils Relative: 3.3 %
HCT: 46.8 % (ref 38.5–50.0)
Hemoglobin: 15.7 g/dL (ref 13.2–17.1)
MCH: 31.7 pg (ref 27.0–33.0)
MCHC: 33.5 g/dL (ref 32.0–36.0)
MCV: 94.4 fL (ref 80.0–100.0)
MPV: 9.4 fL (ref 7.5–12.5)
Monocytes Relative: 9.7 %
Neutro Abs: 4044 {cells}/uL (ref 1500–7800)
Neutrophils Relative %: 66.3 %
Platelets: 293 Thousand/uL (ref 140–400)
RBC: 4.96 Million/uL (ref 4.20–5.80)
RDW: 12 % (ref 11.0–15.0)
Total Lymphocyte: 19.7 %
WBC: 6.1 Thousand/uL (ref 3.8–10.8)

## 2024-09-12 MED ORDER — DICLOFENAC SODIUM 75 MG PO TBEC
75.0000 mg | DELAYED_RELEASE_TABLET | Freq: Every day | ORAL | 5 refills | Status: AC | PRN
  Filled 2024-09-12: qty 30, 30d supply, fill #0
  Filled 2024-12-28: qty 30, 30d supply, fill #1

## 2024-09-12 NOTE — Assessment & Plan Note (Signed)
Stable on advair.  

## 2024-09-12 NOTE — Assessment & Plan Note (Signed)
 Chronic Xanax  use for sleep, risks of dependency and loss of effect discussed. - Limit Xanax  use to acute anxiety episodes only and not for sleep.

## 2024-09-12 NOTE — Assessment & Plan Note (Signed)
 Well-controlled, uric acid level at goal, no recent flares. - Continue allopurinol  100 mg daily.

## 2024-09-12 NOTE — Assessment & Plan Note (Signed)
 Well controlled

## 2024-09-12 NOTE — Assessment & Plan Note (Signed)
 Continues on zocor  and dietary modifications

## 2024-09-12 NOTE — Progress Notes (Signed)
 Careteam: Patient Care Team: Aaron Harlene POUR, NP as PCP - General (Nurse Practitioner) Aaron [name], Aaron CROME, NP (Inactive) as Nurse Practitioner (Vascular Surgery) Aaron Berg, MD as Consulting Physician (General Surgery) Aaron Amel, MD as Consulting Physician (Pulmonary Disease) Aaron Heinz, MD as Consulting Physician (Cardiology) Aaron [name], Aaron SAUNDERS, PA-C (Inactive) as Physician Assistant (Dermatology) Aaron [name], Aaron V, MD as Consulting Physician (Gastroenterology)  PLACE OF SERVICE:  [facility]  Advanced Directive information    Allergies  Allergen Reactions   Singulair  [Montelukast ]     Chief Complaint  Patient presents with   Medical Management of Chronic Issues    6 month follow-up. Discuss flu vaccine. Sign treatment agreement for Xanax . Patient is taking an antiinflammatory (wife's medication- ? Name). Patient with episode of extreme fatigue on last Thursday.     HPI:  Discussed the use of AI scribe software for clinical note transcription with the patient, who gave verbal consent to proceed.  History of Present Illness Aaron [name] is a 68 year old male who presents for a six-month follow-up.  He has psoriasis managed by a dermatologist.  He has not experienced any recent gout flares. His uric acid level was last checked six months ago and was at goal. He takes allopurinol  100 mg daily for gout.   He takes diclofenac  75 mg daily for arthritis pain affecting his knees and sometimes the tops of his feet. He experiences significant pain if he misses a dose, impacting his ability to perform activities such as getting in and out of a truck.  He uses Advair  twice daily for asthma and reports no recent flares.   He takes Xanax  nightly to aid sleep. Reports mood is overall well controlled. No worsening anxiety or depression  He also takes Protonix  regularly and has never experienced acid reflux.  He is on Zocor  20 mg for cholesterol and  takes amlodipine  10 mg and losartan  100 mg daily for blood pressure, which is well-controlled.  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 He felt better over the weekend.  He reports urinating once or twice a night, with no changes in frequency or flow.  No recent chest pains, funny heartbeats, or changes in bowel movements. No recent asthma flares or worsening anxiety or depression.    Review of Systems:  Review of Systems  Constitutional:  Negative for chills, fever and weight loss.  HENT:  Negative for tinnitus.   Respiratory:  Negative for cough, sputum production and shortness of breath.   Cardiovascular:  Negative for chest pain, palpitations and leg swelling.  Gastrointestinal:  Negative for abdominal pain, constipation, diarrhea and heartburn.  Genitourinary:  Negative for dysuria, frequency and urgency.  Musculoskeletal:  Negative for back pain, falls, joint pain and myalgias.  Skin: Negative.   Neurological:  Negative for dizziness and headaches.  Psychiatric/Behavioral:  Negative for depression and memory loss. The patient does not have insomnia.     Past Medical History:  Diagnosis Date   Abdominal aortic aneurysm    sees Aaron [name] for cardiac f/u, [phone]   Abdominal aortic aneurysm (AAA) without rupture 11/01/2013   Arthritis    lower back   Asthma    followed by Aaron [name]   Chronic airway obstruction, not elsewhere classified    Dyspnea    normal PFT April 2012   External hemorrhoids without mention of complication    GERD (gastroesophageal reflux disease)  Gout    H/O hiatal hernia    Hemorrhoids    HTN (hypertension)    hx of sees Aaron [name]   Hyperlipidemia    Insomnia, unspecified    Other abnormal blood chemistry    Other dyspnea and respiratory abnormality    Other malaise and fatigue    Other specified erythematous condition(695.89)     Other testicular dysfunction    Primary hypertension 01/20/2011   Prostatitis, unspecified    Rash 2011   left chest, biopsy 2012 Aaron [name], results pending   Skin cancer    Squamous cell carcinoma of skin 11/03/2014   right shoulder cx3 9fu   Tobacco abuse    Past Surgical History:  Procedure Laterality Date   ABDOMINAL AORTIC ENDOVASCULAR STENT GRAFT  09/22/2012   Procedure: ABDOMINAL AORTIC ENDOVASCULAR STENT GRAFT;  Surgeon: Aaron JONETTA Collum, MD;  Location: [facility] OR;  Service: Vascular;  Laterality: N/A;  GORE; ultrasound guided.   APPENDECTOMY     COLONOSCOPY N/A 09/2023   EVALUATION UNDER ANESTHESIA WITH HEMORRHOIDECTOMY N/A 12/31/2016   Procedure: EXAM UNDER ANESTHESIA WITH HEMORRHOIDECTOMY;  Surgeon: Aaron Poli, MD;  Location: [facility];  Service: General;  Laterality: N/A;   SKIN CANCER EXCISION     TOE SURGERY  01/2012   joint of left great toe   TONSILLECTOMY     TONSILLECTOMY     TYMPANOPLASTY     WRIST SURGERY     left   Social History:   reports that he quit smoking about 3 years ago. His smoking use included cigarettes. He started smoking about 41 years ago. He has a 9.5 pack-year smoking history. He has never been exposed to tobacco smoke. He quit smokeless tobacco use about 12 years ago.  His smokeless tobacco use included snuff. He reports current alcohol use. He reports that he does not use drugs.  Family History  Problem Relation Age of Onset   Cancer Mother        Breast   Heart attack Father 75   COPD Father    Heart disease Father    Osler-Weber-Rendu syndrome Brother    Asthma Daughter    Lupus Daughter    Diabetes Other        Grandson    Colon cancer Neg Hx    Pancreatic cancer Neg Hx    Esophageal cancer Neg Hx    Stomach cancer Neg Hx    Rectal cancer Neg Hx     Medications: Patient's Medications  New Prescriptions   No medications on file  Previous Medications   ACETAMINOPHEN  (TYLENOL ) 500 MG TABLET    Take 2  tablets (1,000 mg total) by mouth every 6 (six) hours as needed.   ALBUTEROL  (VENTOLIN  HFA) 108 (90 BASE) MCG/ACT INHALER    Inhale 2 puffs into the lungs every 6 (six) hours as needed for cough and wheezing.   ALLOPURINOL  (ZYLOPRIM ) 100 MG TABLET    Take 1 tablet (100 mg total) by mouth daily.   ALPRAZOLAM  (XANAX ) 0.25 MG TABLET    Take 1 tablet (0.25 mg) by mouth daily as needed for anxiety (to last 30 days).   AMLODIPINE  (NORVASC ) 10 MG TABLET    Take 1 tablet (10 mg total) by mouth daily.   ASPIRIN  EC 81 MG TABLET    Take 1 tablet (81 mg total) by mouth daily. Swallow whole.   BUPROPION  (WELLBUTRIN  XL) 150 MG 24 HR TABLET    Take 1 tablet (150 mg total)  by mouth daily.   CLOBETASOL  (TEMOVATE ) 0.05 % EXTERNAL SOLUTION    Apply 1 Application topically 2 (two) times daily.   COLCHICINE  0.6 MG TABLET    Take 0.6 mg by mouth as needed. For gout flare up   DICLOFENAC  (CATAFLAM ) 50 MG TABLET    Take 1 tablet (50 mg total) by mouth 2 (two) times daily.   FLUTICASONE -SALMETEROL (ADVAIR ) 100-50 MCG/ACT AEPB    Inhale 1 puff into the lungs 2 (two) times daily.   LOSARTAN  (COZAAR ) 100 MG TABLET    Take 1 tablet (100 mg total) by mouth daily.   PANTOPRAZOLE  (PROTONIX ) 40 MG TABLET    Take 1 tablet (40 mg total) by mouth daily.   SILDENAFIL  (VIAGRA ) 50 MG TABLET    Take 1 tablet (50 mg) by mouth as needed for erectile dysfunction.   SIMVASTATIN  (ZOCOR ) 20 MG TABLET    Take 1 tablet (20 mg total) by mouth at bedtime.   TRIAMCINOLONE  OINTMENT (KENALOG ) 0.1 %    Apply 1 Application topically 2 (two) times daily. Use for 2 weeks, then stop for 2 weeks. Repeat if needed.  Modified Medications   No medications on file  Discontinued Medications   No medications on file    Physical Exam:  Vitals:   09/12/24 0810  BP: 128/84  Pulse: 81  Temp: (!) 97.3 F (36.3 C)  SpO2: 97%  Weight: 229 lb (103.9 kg)  Height: 6' 1 (1.854 m)   Body mass index is 30.21 kg/m. Wt Readings from Last 3 Encounters:   09/12/24 229 lb (103.9 kg)  03/14/24 227 lb (103 kg)  02/19/24 230 lb (104.3 kg)    Physical Exam Constitutional:      General: He is not in acute distress.    Appearance: He is well-developed. He is not diaphoretic.  HENT:     Head: Normocephalic and atraumatic.     Right Ear: External ear normal.     Left Ear: External ear normal.     Mouth/Throat:     Pharynx: No oropharyngeal exudate.  Eyes:     Conjunctiva/sclera: Conjunctivae normal.     Pupils: Pupils are equal, round, and reactive to light.  Cardiovascular:     Rate and Rhythm: Normal rate and regular rhythm.     Heart sounds: Normal heart sounds.  Pulmonary:     Effort: Pulmonary effort is normal.     Breath sounds: Normal breath sounds.  Abdominal:     General: Bowel sounds are normal.     Palpations: Abdomen is soft.  Musculoskeletal:        General: No tenderness.     Cervical back: Normal range of motion and neck supple.     Right lower leg: No edema.     Left lower leg: No edema.  Skin:    General: Skin is warm and dry.  Neurological:     Mental Status: He is alert and oriented to person, place, and time.     Labs reviewed: Basic Metabolic Panel: Recent Labs    02/22/24 0851  NA 140  K 4.4  CL 104  CO2 27  GLUCOSE 99  BUN 17  CREATININE 1.16  CALCIUM 9.6   Liver Function Tests: Recent Labs    02/22/24 0851  AST 28  ALT 30  BILITOT 0.5  PROT 7.0   No results for input(s): LIPASE, AMYLASE in the last 8760 hours. No results for input(s): AMMONIA in the last 8760 hours. CBC: Recent Labs  02/22/24 0851  WBC 9.3  NEUTROABS 6,724  HGB 14.1  HCT 41.9  MCV 93.7  PLT 316   Lipid Panel: Recent Labs    02/22/24 0851  CHOL 131  HDL 50  LDLCALC 61  TRIG 116  CHOLHDL 2.6   TSH: No results for input(s): TSH in the last 8760 hours. A1C: Lab Results  Component Value Date   HGBA1C 5.4 02/22/2024     Assessment/Plan  Primary osteoarthritis involving multiple  joints Assessment & Plan: Significant pain relief from diclofenac , which was originally prescribed by neuro Discussed risk of long term use and side effects. He reports pain is debilitating and limits his function without. Advised to use as needed - Prescribe diclofenac  75 mg daily as needed - Check kidney function, electrolytes, and blood counts. - Continue Protonix  for gastric protection.  Orders: -     Diclofenac  Sodium; Take 1 tablet (75 mg total) by mouth daily as needed.  Dispense: 30 tablet; Refill: 5  Moderate persistent asthma without complication Assessment & Plan: Stable on advair     Mild episode of recurrent major depressive disorder Assessment & Plan: Well controlled.    Mixed hyperlipidemia Assessment & Plan: Continues on zocor  and dietary modifications    Essential hypertension Assessment & Plan: Blood pressure well controlled, goal bp <140/90 Continue current medications and dietary modifications follow metabolic panel   Orders: -     Comprehensive metabolic panel with GFR -     CBC with Differential/Platelet  Anxiety Assessment & Plan: Chronic Xanax  use for sleep, risks of dependency and loss of effect discussed. - Limit Xanax  use to acute anxiety episodes only and not for sleep.    Immunization due -     Flu vaccine HIGH DOSE PF(Fluzone Trivalent)  Psoriasis Assessment & Plan: Managed by dermatology   Gout, unspecified cause, unspecified chronicity, unspecified site Assessment & Plan: Well-controlled, uric acid level at goal, no recent flares. - Continue allopurinol  100 mg daily.    Return in about 6 months (around 03/13/2025) for routine follow up, labs at time of visit.:   Drelyn Pistilli K. Aaron BODILY [facility] [phone]

## 2024-09-12 NOTE — Assessment & Plan Note (Signed)
 Significant pain relief from diclofenac , which was originally prescribed by neuro Discussed risk of long term use and side effects. He reports pain is debilitating and limits his function without. Advised to use as needed - Prescribe diclofenac  75 mg daily as needed - Check kidney function, electrolytes, and blood counts. - Continue Protonix  for gastric protection.

## 2024-09-12 NOTE — Assessment & Plan Note (Signed)
 Managed by dermatology

## 2024-09-12 NOTE — Assessment & Plan Note (Signed)
 Blood pressure well controlled, goal bp <140/90 Continue current medications and dietary modifications follow metabolic panel

## 2024-09-13 ENCOUNTER — Ambulatory Visit: Payer: Self-pay | Admitting: Nurse Practitioner

## 2024-09-27 ENCOUNTER — Other Ambulatory Visit: Payer: Self-pay

## 2024-10-13 ENCOUNTER — Other Ambulatory Visit: Payer: Self-pay | Admitting: Nurse Practitioner

## 2024-10-14 ENCOUNTER — Other Ambulatory Visit (HOSPITAL_COMMUNITY): Payer: Self-pay

## 2024-10-14 ENCOUNTER — Other Ambulatory Visit: Payer: Self-pay

## 2024-10-14 MED ORDER — FLUTICASONE-SALMETEROL 100-50 MCG/ACT IN AEPB
1.0000 | INHALATION_SPRAY | Freq: Two times a day (BID) | RESPIRATORY_TRACT | 3 refills | Status: AC
  Filled 2024-10-14: qty 60, 30d supply, fill #0
  Filled 2024-11-08: qty 60, 30d supply, fill #1
  Filled 2024-12-09 – 2024-12-12 (×2): qty 60, 30d supply, fill #2

## 2024-10-19 ENCOUNTER — Other Ambulatory Visit: Payer: Self-pay | Admitting: Nurse Practitioner

## 2024-10-19 ENCOUNTER — Other Ambulatory Visit: Payer: Self-pay

## 2024-10-19 DIAGNOSIS — I10 Essential (primary) hypertension: Secondary | ICD-10-CM

## 2024-10-19 MED ORDER — AMLODIPINE BESYLATE 10 MG PO TABS
10.0000 mg | ORAL_TABLET | Freq: Every day | ORAL | 1 refills | Status: AC
  Filled 2024-10-19: qty 90, 90d supply, fill #0

## 2024-11-08 ENCOUNTER — Other Ambulatory Visit: Payer: Self-pay

## 2024-11-08 ENCOUNTER — Other Ambulatory Visit (HOSPITAL_COMMUNITY): Payer: Self-pay

## 2024-11-14 ENCOUNTER — Other Ambulatory Visit: Payer: Self-pay

## 2024-11-14 DIAGNOSIS — Z8679 Personal history of other diseases of the circulatory system: Secondary | ICD-10-CM

## 2024-11-21 ENCOUNTER — Other Ambulatory Visit: Payer: Self-pay

## 2024-11-25 ENCOUNTER — Encounter: Admitting: Dermatology

## 2024-12-09 ENCOUNTER — Other Ambulatory Visit: Payer: Self-pay | Admitting: Nurse Practitioner

## 2024-12-09 ENCOUNTER — Other Ambulatory Visit: Payer: Self-pay

## 2024-12-09 ENCOUNTER — Other Ambulatory Visit (HOSPITAL_COMMUNITY): Payer: Self-pay

## 2024-12-09 DIAGNOSIS — F419 Anxiety disorder, unspecified: Secondary | ICD-10-CM

## 2024-12-09 MED ORDER — ALPRAZOLAM 0.25 MG PO TABS
0.2500 mg | ORAL_TABLET | Freq: Every day | ORAL | 5 refills | Status: AC | PRN
  Filled 2024-12-09: qty 20, 30d supply, fill #0

## 2024-12-09 NOTE — Telephone Encounter (Signed)
 Patient is requesting a refill of the following medications: Requested Prescriptions   Pending Prescriptions Disp Refills   ALPRAZolam  (XANAX ) 0.25 MG tablet 20 tablet 5    Sig: Take 1 tablet (0.25 mg) by mouth daily as needed for anxiety (to last 30 days).    Date of last refill: 05/17/24  Refill amount: 20/5  Treatment agreement date: October 2025

## 2024-12-12 ENCOUNTER — Other Ambulatory Visit: Payer: Self-pay

## 2024-12-20 ENCOUNTER — Ambulatory Visit (INDEPENDENT_AMBULATORY_CARE_PROVIDER_SITE_OTHER): Admitting: Physician Assistant

## 2024-12-20 ENCOUNTER — Ambulatory Visit (INDEPENDENT_AMBULATORY_CARE_PROVIDER_SITE_OTHER)

## 2024-12-20 VITALS — BP 141/90 | HR 84 | Ht 73.0 in | Wt 229.0 lb

## 2024-12-20 DIAGNOSIS — Z8679 Personal history of other diseases of the circulatory system: Secondary | ICD-10-CM

## 2024-12-20 DIAGNOSIS — Z95828 Presence of other vascular implants and grafts: Secondary | ICD-10-CM | POA: Diagnosis not present

## 2024-12-20 NOTE — Progress Notes (Signed)
 " Office Note     CC:  follow up Requesting Provider:  Caro Harlene POUR, NP  HPI: Aaron [name] is a 69 y.o. (05-Sep-1956) male who presents for surveillance of endovascular repair of abdominal aortic aneurysm.  This was performed by Dr. Gerlean in 2013 due to a 5.1 cm infrarenal abdominal aortic aneurysm.  He has chronic low back pain with radiation into his thighs however this has been unchanged.  He denies any new or changing abdominal pain.  He follows regularly with his PCP for management of chronic medical conditions including hyperlipidemia.  He is a former smoker.  He takes aspirin  and statin daily.   Past Medical History:  Diagnosis Date   Abdominal aortic aneurysm    sees Dr. Ladona for cardiac f/u, [phone]   Abdominal aortic aneurysm (AAA) without rupture 11/01/2013   Arthritis    lower back   Asthma    followed by Dr. Geary   Chronic airway obstruction, not elsewhere classified    Dyspnea    normal PFT April 2012   External hemorrhoids without mention of complication    GERD (gastroesophageal reflux disease)    Gout    H/O hiatal hernia    Hemorrhoids    HTN (hypertension)    hx of sees Dr. Rickey   Hyperlipidemia    Insomnia, unspecified    Other abnormal blood chemistry    Other dyspnea and respiratory abnormality    Other malaise and fatigue    Other specified erythematous condition(695.89)    Other testicular dysfunction    Primary hypertension 01/20/2011   Prostatitis, unspecified    Rash 2011   left chest, biopsy 2012 Dr. Junior, results pending   Skin cancer    Squamous cell carcinoma of skin 11/03/2014   right shoulder cx3 [id number]   Tobacco abuse     Past Surgical History:  Procedure Laterality Date   ABDOMINAL AORTIC ENDOVASCULAR STENT GRAFT  09/22/2012   Procedure: ABDOMINAL AORTIC ENDOVASCULAR STENT GRAFT;  Surgeon: Lynwood JONETTA Gerlean, MD;  Location: [facility] OR;  Service: Vascular;  Laterality: N/A;  GORE; ultrasound guided.    APPENDECTOMY     COLONOSCOPY N/A 09/2023   EVALUATION UNDER ANESTHESIA WITH HEMORRHOIDECTOMY N/A 12/31/2016   Procedure: EXAM UNDER ANESTHESIA WITH HEMORRHOIDECTOMY;  Surgeon: Vicenta Poli, MD;  Location: [facility];  Service: General;  Laterality: N/A;   SKIN CANCER EXCISION     TOE SURGERY  01/2012   joint of left great toe   TONSILLECTOMY     TONSILLECTOMY     TYMPANOPLASTY     WRIST SURGERY     left    Social History   Socioeconomic History   Marital status: Married    Spouse name: Not on file   Number of children: Not on file   Years of education: Not on file   Highest education level: Some college, no degree  Occupational History   Occupation: CNA    Employer: [employer]  Tobacco Use   Smoking status: Former    Current packs/day: 0.00    Average packs/day: 0.3 packs/day for 38.0 years (9.5 ttl pk-yrs)    Types: Cigarettes    Start date: 10/31/1982    Quit date: 10/31/2020    Years since quitting: 4.1    Passive exposure: Never   Smokeless tobacco: Former    Types: Snuff    Quit date: 11/25/2011  Vaping Use   Vaping status: Never Used  Substance and Sexual Activity  Alcohol use: Yes    Alcohol/week: 0.0 standard drinks of alcohol    Comment: occ   Drug use: No   Sexual activity: Yes  Other Topics Concern   Not on file  Social History Narrative   Not on file   Social Drivers of Health   Tobacco Use: Medium Risk (12/20/2024)   Patient History    Smoking Tobacco Use: Former    Smokeless Tobacco Use: Former    Passive Exposure: Never  Programmer, Applications: Low Risk (09/09/2024)   Overall Financial Resource Strain (CARDIA)    Difficulty of Paying Living Expenses: Not very hard  Food Insecurity: No Food Insecurity (09/09/2024)   Epic    Worried About Programme Researcher, Broadcasting/film/video in the Last Year: Never true    Ran Out of Food in the Last Year: Never true  Transportation Needs: No Transportation Needs (09/09/2024)   Epic    Lack of  Transportation (Medical): No    Lack of Transportation (Non-Medical): No  Physical Activity: Insufficiently Active (09/09/2024)   Exercise Vital Sign    Days of Exercise per Week: 3 days    Minutes of Exercise per Session: 30 min  Stress: Stress Concern Present (09/09/2024)   [employer] of Occupational Health - Occupational Stress Questionnaire    Feeling of Stress: To some extent  Social Connections: Socially Integrated (09/09/2024)   Social Connection and Isolation Panel    Frequency of Communication with Friends and Family: More than three times a week    Frequency of Social Gatherings with Friends and Family: Once a week    Attends Religious Services: More than 4 times per year    Active Member of [employer] or Organizations: Yes    Attends Engineer, Structural: More than 4 times per year    Marital Status: Married  Catering Manager Violence: Not At Risk (02/25/2024)   Humiliation, Afraid, Rape, and Kick questionnaire    Fear of Current or Ex-Partner: No    Emotionally Abused: No    Physically Abused: No    Sexually Abused: No  Depression (PHQ2-9): Low Risk (02/25/2024)   Depression (PHQ2-9)    PHQ-2 Score: 0  Alcohol Screen: Low Risk (09/09/2024)   Alcohol Screen    Last Alcohol Screening Score (AUDIT): 4  Housing: Low Risk (09/09/2024)   Epic    Unable to Pay for Housing in the Last Year: No    Number of Times Moved in the Last Year: 0    Homeless in the Last Year: No  Utilities: Not At Risk (02/25/2024)   AHC Utilities    Threatened with loss of utilities: No  Health Literacy: Not on file    Family History  Problem Relation Age of Onset   Cancer Mother        Breast   Heart attack Father 30   COPD Father    Heart disease Father    Osler-Weber-Rendu syndrome Brother    Asthma Daughter    Lupus Daughter    Diabetes Other        Grandson    Colon cancer Neg Hx    Pancreatic cancer Neg Hx    Esophageal cancer Neg Hx    Stomach cancer Neg Hx    Rectal  cancer Neg Hx     Current Outpatient Medications  Medication Sig Dispense Refill   acetaminophen  (TYLENOL ) 500 MG tablet Take 2 tablets (1,000 mg total) by mouth every 6 (six) hours as needed. 30 tablet 5  albuterol  (VENTOLIN  HFA) 108 (90 Base) MCG/ACT inhaler Inhale 2 puffs into the lungs every 6 (six) hours as needed for cough and wheezing. 20.1 g 3   allopurinol  (ZYLOPRIM ) 100 MG tablet Take 1 tablet (100 mg total) by mouth daily. 90 tablet 1   ALPRAZolam  (XANAX ) 0.25 MG tablet Take 1 tablet (0.25 mg) by mouth daily as needed for anxiety (to last 30 days). 20 tablet 5   amLODipine  (NORVASC ) 10 MG tablet Take 1 tablet (10 mg total) by mouth daily. 90 tablet 1   aspirin  EC 81 MG tablet Take 1 tablet (81 mg total) by mouth daily. Swallow whole. 90 tablet 1   buPROPion  (WELLBUTRIN  XL) 150 MG 24 hr tablet Take 1 tablet (150 mg total) by mouth daily. 90 tablet 1   clobetasol  (TEMOVATE ) 0.05 % external solution Apply 1 Application topically 2 (two) times daily. 50 mL 0   colchicine  0.6 MG tablet Take 0.6 mg by mouth as needed. For gout flare up     diclofenac  (VOLTAREN ) 75 MG EC tablet Take 1 tablet (75 mg total) by mouth daily as needed. 30 tablet 5   fluticasone -salmeterol (ADVAIR ) 100-50 MCG/ACT AEPB Inhale 1 puff into the lungs 2 (two) times daily. 60 each 3   losartan  (COZAAR ) 100 MG tablet Take 1 tablet (100 mg total) by mouth daily. 90 tablet 3   pantoprazole  (PROTONIX ) 40 MG tablet Take 1 tablet (40 mg total) by mouth daily. 90 tablet 3   sildenafil  (VIAGRA ) 50 MG tablet Take 1 tablet (50 mg) by mouth as needed for erectile dysfunction. 6 tablet 5   simvastatin  (ZOCOR ) 20 MG tablet Take 1 tablet (20 mg total) by mouth at bedtime. 90 tablet 3   triamcinolone  ointment (KENALOG ) 0.1 % Apply 1 Application topically 2 (two) times daily. Use for 2 weeks, then stop for 2 weeks. Repeat if needed. 454 g 4   Current Facility-Administered Medications  Medication Dose Route Frequency Provider Last  Rate Last Admin   0.9 %  sodium chloride  infusion  500 mL Intravenous Once Nandigam, Kavitha V, MD        Allergies[1]   REVIEW OF SYSTEMS:  Negative unless noted in HPI [X]  denotes positive finding, [ ]  denotes negative finding Cardiac  Comments:  Chest pain or chest pressure:    Shortness of breath upon exertion:    Short of breath when lying flat:    Irregular heart rhythm:        Vascular    Pain in calf, thigh, or hip brought on by ambulation:    Pain in feet at night that wakes you up from your sleep:     Blood clot in your veins:    Leg swelling:         Pulmonary    Oxygen at home:    Productive cough:     Wheezing:         Neurologic    Sudden weakness in arms or legs:     Sudden numbness in arms or legs:     Sudden onset of difficulty speaking or slurred speech:    Temporary loss of vision in one eye:     Problems with dizziness:         Gastrointestinal    Blood in stool:     Vomited blood:         Genitourinary    Burning when urinating:     Blood in urine:        Psychiatric  Major depression:         Hematologic    Bleeding problems:    Problems with blood clotting too easily:        Skin    Rashes or ulcers:        Constitutional    Fever or chills:      PHYSICAL EXAMINATION:  Vitals:   12/20/24 0858  BP: (!) 141/90  Pulse: 84  Weight: 229 lb (103.9 kg)  Height: 6' 1 (1.854 m)    General:  WDWN in NAD; vital signs documented above Gait: Not observed HENT: WNL, normocephalic Pulmonary: normal non-labored breathing Cardiac: regular HR Abdomen: soft, NT, no masses Skin: without rashes Extremities: without ischemic changes, without Gangrene , without cellulitis; without open wounds;  Musculoskeletal: no muscle wasting or atrophy  Neurologic: A&O X 3 Psychiatric:  The pt has Normal affect.   Non-Invasive Vascular Imaging:   4cm centimeter AAA at the largest diameter    ASSESSMENT/PLAN:: 69 y.o. male here for follow up  for surveillance of endovascular repair of abdominal aortic aneurysm  Subjectively Aaron [name] has been doing well since last office visit.  He underwent EVAR by Dr. Gerlean in 2013.  Duplex today demonstrates a stable AAA sac measuring 4 cm at its largest diameter.  No endoleak's were noted.  We will continue surveillance annually with ultrasound.  He will continue his aspirin  and statin daily.   Donnice Sender, PA-C [facility] [phone]     [1]  Allergies Allergen Reactions   Singulair  [Montelukast ]    "

## 2024-12-28 ENCOUNTER — Other Ambulatory Visit: Payer: Self-pay | Admitting: Nurse Practitioner

## 2024-12-28 ENCOUNTER — Other Ambulatory Visit: Payer: Self-pay

## 2024-12-28 ENCOUNTER — Other Ambulatory Visit (HOSPITAL_COMMUNITY): Payer: Self-pay

## 2024-12-28 DIAGNOSIS — K219 Gastro-esophageal reflux disease without esophagitis: Secondary | ICD-10-CM

## 2024-12-28 MED ORDER — PANTOPRAZOLE SODIUM 40 MG PO TBEC
40.0000 mg | DELAYED_RELEASE_TABLET | Freq: Every day | ORAL | 1 refills | Status: AC
  Filled 2024-12-28: qty 90, 90d supply, fill #0

## 2025-01-18 ENCOUNTER — Ambulatory Visit: Admitting: Dermatology

## 2025-02-06 ENCOUNTER — Encounter: Admitting: Dermatology

## 2025-02-28 ENCOUNTER — Ambulatory Visit: Payer: Self-pay | Admitting: Nurse Practitioner

## 2025-03-20 ENCOUNTER — Ambulatory Visit: Payer: Self-pay | Admitting: Nurse Practitioner

## 2025-08-15 ENCOUNTER — Ambulatory Visit: Admitting: Dermatology
